# Patient Record
Sex: Female | Born: 1955 | Race: Black or African American | Hispanic: No | Marital: Married | Smoking: Never smoker
Health system: Southern US, Community
[De-identification: ages and names within clinical notes are randomized; demographics above are authoritative.]

## PROBLEM LIST (undated history)

## (undated) DIAGNOSIS — T7840XA Allergy, unspecified, initial encounter: Secondary | ICD-10-CM

## (undated) DIAGNOSIS — IMO0001 Reserved for inherently not codable concepts without codable children: Secondary | ICD-10-CM

## (undated) DIAGNOSIS — E119 Type 2 diabetes mellitus without complications: Secondary | ICD-10-CM

## (undated) DIAGNOSIS — I350 Nonrheumatic aortic (valve) stenosis: Secondary | ICD-10-CM

## (undated) DIAGNOSIS — I10 Essential (primary) hypertension: Secondary | ICD-10-CM

## (undated) DIAGNOSIS — J45909 Unspecified asthma, uncomplicated: Secondary | ICD-10-CM

## (undated) DIAGNOSIS — R0600 Dyspnea, unspecified: Secondary | ICD-10-CM

## (undated) DIAGNOSIS — Z6841 Body Mass Index (BMI) 40.0 and over, adult: Secondary | ICD-10-CM

## (undated) DIAGNOSIS — M1612 Unilateral primary osteoarthritis, left hip: Secondary | ICD-10-CM

## (undated) DIAGNOSIS — R011 Cardiac murmur, unspecified: Secondary | ICD-10-CM

## (undated) DIAGNOSIS — K219 Gastro-esophageal reflux disease without esophagitis: Secondary | ICD-10-CM

## (undated) DIAGNOSIS — G971 Other reaction to spinal and lumbar puncture: Secondary | ICD-10-CM

## (undated) DIAGNOSIS — F419 Anxiety disorder, unspecified: Secondary | ICD-10-CM

## (undated) DIAGNOSIS — M1990 Unspecified osteoarthritis, unspecified site: Secondary | ICD-10-CM

## (undated) DIAGNOSIS — D649 Anemia, unspecified: Secondary | ICD-10-CM

## (undated) DIAGNOSIS — E7800 Pure hypercholesterolemia, unspecified: Secondary | ICD-10-CM

## (undated) HISTORY — DX: Nonrheumatic aortic (valve) stenosis: I35.0

## (undated) HISTORY — PX: TUBAL LIGATION: SHX77

## (undated) HISTORY — DX: Gastro-esophageal reflux disease without esophagitis: K21.9

## (undated) HISTORY — DX: Unspecified asthma, uncomplicated: J45.909

## (undated) HISTORY — DX: Anemia, unspecified: D64.9

## (undated) HISTORY — DX: Anxiety disorder, unspecified: F41.9

## (undated) HISTORY — DX: Body Mass Index (BMI) 40.0 and over, adult: Z684

## (undated) HISTORY — DX: Pure hypercholesterolemia, unspecified: E78.00

## (undated) HISTORY — DX: Cardiac murmur, unspecified: R01.1

## (undated) HISTORY — DX: Type 2 diabetes mellitus without complications: E11.9

## (undated) HISTORY — PX: COLONOSCOPY: SHX174

## (undated) HISTORY — DX: Unilateral primary osteoarthritis, left hip: M16.12

## (undated) HISTORY — DX: Reserved for inherently not codable concepts without codable children: IMO0001

## (undated) HISTORY — DX: Allergy, unspecified, initial encounter: T78.40XA

## (undated) HISTORY — DX: Essential (primary) hypertension: I10

## (undated) HISTORY — DX: Unspecified osteoarthritis, unspecified site: M19.90

---

## 1999-11-04 ENCOUNTER — Other Ambulatory Visit: Admission: RE | Admit: 1999-11-04 | Discharge: 1999-11-04 | Payer: Self-pay | Admitting: Obstetrics

## 2001-07-11 ENCOUNTER — Encounter: Payer: Self-pay | Admitting: Oncology

## 2001-07-11 ENCOUNTER — Ambulatory Visit (HOSPITAL_COMMUNITY): Admission: RE | Admit: 2001-07-11 | Discharge: 2001-07-11 | Payer: Self-pay | Admitting: Oncology

## 2004-07-13 ENCOUNTER — Encounter: Admission: RE | Admit: 2004-07-13 | Discharge: 2004-10-11 | Payer: Self-pay | Admitting: Internal Medicine

## 2007-07-24 ENCOUNTER — Encounter (INDEPENDENT_AMBULATORY_CARE_PROVIDER_SITE_OTHER): Payer: Self-pay | Admitting: Gastroenterology

## 2007-07-24 ENCOUNTER — Ambulatory Visit (HOSPITAL_COMMUNITY): Admission: RE | Admit: 2007-07-24 | Discharge: 2007-07-24 | Payer: Self-pay | Admitting: Gastroenterology

## 2009-07-25 ENCOUNTER — Ambulatory Visit (HOSPITAL_COMMUNITY): Admission: RE | Admit: 2009-07-25 | Discharge: 2009-07-25 | Payer: Self-pay | Admitting: Specialist

## 2010-06-14 ENCOUNTER — Encounter: Payer: Self-pay | Admitting: Family Medicine

## 2010-06-27 ENCOUNTER — Inpatient Hospital Stay (INDEPENDENT_AMBULATORY_CARE_PROVIDER_SITE_OTHER)
Admission: RE | Admit: 2010-06-27 | Discharge: 2010-06-27 | Disposition: A | Discharge status: Home / Self Care | Payer: Commercial Managed Care - PPO | Source: Ambulatory Visit | Attending: Family Medicine | Admitting: Family Medicine

## 2010-06-27 DIAGNOSIS — S40019A Contusion of unspecified shoulder, initial encounter: Secondary | ICD-10-CM

## 2010-10-06 NOTE — Op Note (Signed)
NAME:  [name], [name]             ACCOUNT NO.:  [id number]   MEDICAL RECORD NO.:  [id number]          PATIENT TYPE:  AMB   LOCATION:  ENDO                         FACILITY:  [facility]   PHYSICIAN:  Anselmo Rod, M.D.  DATE OF BIRTH:  1955/12/09   DATE OF PROCEDURE:  07/24/2007  DATE OF DISCHARGE:                               OPERATIVE REPORT   PROCEDURE PERFORMED:  Esophagogastroduodenoscopy with small-bowel  biopsies.   ENDOSCOPIST:  Anselmo Rod, MD   INSTRUMENT USED:  Pentax video panendoscope.   INDICATIONS FOR PROCEDURE:  [age]-year-old African American female with  a history of iron-deficiency anemia, undergoing EGD.  Small-bowel  biopsies are planned.   PREPROCEDURE PREPARATION:  The patient was fasted for 8 hours prior to  the procedure.  Risks and benefits of the procedure were discussed with  the patient in great detail.   PREPROCEDURE PHYSICAL:  VITAL SIGNS:  The patient had stable vital  signs.  NECK:  Supple.  CHEST:  Clear to auscultation.  CARDIAC:  S1 and S2 regular.  ABDOMEN:  Soft with normal bowel sounds.   DESCRIPTION OF PROCEDURE:  The patient was placed in the left lateral  decubitus position and sedated with 125 mcg of Fentanyl and 12.5 mg of  Versed given intravenously in slow incremental doses.  Once the patient  was adequately sedated and maintained on low-flow oxygen and continuous  cardiac monitoring, the Pentax video panendoscope was advanced through  the mouthpiece, over the tongue and into the esophagus under direct  vision.  The entire esophagus was widely patent with no evidence of  ring, stricture, mass, esophagitis or Barrett's mucosa.  The scope was  then advanced into the stomach.  A small hiatal hernia was seen on high  retroflexion.  The rest of the gastric mucosa and proximal small bowel  appeared normal.  Small-bowel biopsies were done to rule out sprue.  The  patient tolerated the procedure well without complications.  There  was  no outlet obstruction.   IMPRESSION:  1. Normal esophagogastroduodenoscopy, except for a small hiatal hernia      seen on high retroflexion.  2. Small bowel biopsies done to rule out sprue.   RECOMMENDATIONS:  1. Await pathology results.  2. Avoid all nonsteroidals including aspirin for the next 2 weeks.  3. Proceed with a colonoscopy at this time.  4. Further recommendations will be made thereafter.      Anselmo Rod, M.D.  Electronically Signed     JNM/MEDQ  D:  07/25/2007  T:  07/26/2007  Job:  [id number]   cc:   Carollee Massed

## 2010-10-06 NOTE — Op Note (Signed)
NAME:  [name], [name]             ACCOUNT NO.:  [id number]   MEDICAL RECORD NO.:  [id number]          PATIENT TYPE:  AMB   LOCATION:  ENDO                         FACILITY:  [facility]   PHYSICIAN:  Anselmo Rod, M.D.  DATE OF BIRTH:  [birth year]   DATE OF PROCEDURE:  07/24/2007  DATE OF DISCHARGE:                               OPERATIVE REPORT   PROCEDURE PERFORMED:  Screening colonoscopy.   ENDOSCOPIST:  Anselmo Rod, M.D.   INSTRUMENT USED:  Pentax video colonoscope.   INDICATIONS FOR PROCEDURE:  A 55 year old [place] American female with a  history of iron-deficiency anemia undergoing a colonoscopy to rule out  colonic polyps, masses, etc.   PREPROCEDURE PREPARATION:  Informed consent was procured from the  patient.  The patient fasted for 8 hours prior to the procedure after  being prepped with a bottle of magnesium citrate and a gallon of  NuLYTELY the night prior to the procedure.  Risks and benefits of the  procedure including a 10% miss rate of cancer and polyps, were discussed  with the patient as well.   PREPROCEDURE PREPARATION:  The patient had stable vital signs.  NECK:  Supple.  CHEST:  Clear to auscultation.  S1, S2 regular.  ABDOMEN:  Soft with normal bowel sounds.   DESCRIPTION OF PROCEDURE:  The patient was placed in the left lateral  decubitus position and sedated with Fentanyl and Versed for the EGD.  No  additional sedation was used for the colonoscopy.  Once the patient was  adequately positioned, the Pentax video colonoscope was advanced from  the rectum to the cecum.  The appendiceal orifice and ileocecal valve  were clearly visualized and photographed.  The terminal ileum appeared  healthy and without lesions.  There was some residual stool in the  colon.  Multiple washes were done.  Retroflexion in the rectum revealed  no abnormalities.   IMPRESSION:  Normal colonoscopy up to the terminal ileum.  No masses,  polyps, erosions, ulcerations or  diverticula noted.   RECOMMENDATIONS.:  1. Continue a high-fiber diet with liberal fluid intake.  2. Repeat colonoscopy in the next 10 years.  If the patient has any      abnormal symptoms in the interim, she is to contact the office      immediately for further recommendations.  3. Outpatient follow-up as need arises in the future.      Anselmo Rod, M.D.  Electronically Signed     JNM/MEDQ  D:  07/25/2007  T:  07/26/2007  Job:  [id number]   cc:   Otho Bellows, MD

## 2011-07-04 ENCOUNTER — Ambulatory Visit (INDEPENDENT_AMBULATORY_CARE_PROVIDER_SITE_OTHER): Payer: 59 | Admitting: Emergency Medicine

## 2011-07-04 VITALS — BP 130/78 | HR 68 | Temp 98.2°F | Resp 14 | Ht 58.5 in | Wt 221.0 lb

## 2011-07-04 DIAGNOSIS — D649 Anemia, unspecified: Secondary | ICD-10-CM

## 2011-07-04 DIAGNOSIS — I10 Essential (primary) hypertension: Secondary | ICD-10-CM | POA: Insufficient documentation

## 2011-07-04 DIAGNOSIS — M545 Low back pain, unspecified: Secondary | ICD-10-CM

## 2011-07-04 DIAGNOSIS — E119 Type 2 diabetes mellitus without complications: Secondary | ICD-10-CM | POA: Insufficient documentation

## 2011-07-04 DIAGNOSIS — Z0000 Encounter for general adult medical examination without abnormal findings: Secondary | ICD-10-CM

## 2011-07-04 DIAGNOSIS — K219 Gastro-esophageal reflux disease without esophagitis: Secondary | ICD-10-CM

## 2011-07-04 DIAGNOSIS — E782 Mixed hyperlipidemia: Secondary | ICD-10-CM

## 2011-07-04 DIAGNOSIS — E7800 Pure hypercholesterolemia, unspecified: Secondary | ICD-10-CM | POA: Insufficient documentation

## 2011-07-04 DIAGNOSIS — R8281 Pyuria: Secondary | ICD-10-CM

## 2011-07-04 DIAGNOSIS — Z139 Encounter for screening, unspecified: Secondary | ICD-10-CM

## 2011-07-04 DIAGNOSIS — IMO0001 Reserved for inherently not codable concepts without codable children: Secondary | ICD-10-CM

## 2011-07-04 LAB — COMPREHENSIVE METABOLIC PANEL
ALT: 13 U/L (ref 0–35)
AST: 16 U/L (ref 0–37)
Albumin: 4.7 g/dL (ref 3.5–5.2)
Alkaline Phosphatase: 58 U/L (ref 39–117)
BUN: 14 mg/dL (ref 6–23)
CO2: 31 mEq/L (ref 19–32)
Calcium: 10 mg/dL (ref 8.4–10.5)
Chloride: 101 mEq/L (ref 96–112)
Creat: 0.82 mg/dL (ref 0.50–1.10)
Glucose, Bld: 97 mg/dL (ref 70–99)
Potassium: 3.9 mEq/L (ref 3.5–5.3)
Sodium: 142 mEq/L (ref 135–145)
Total Bilirubin: 0.3 mg/dL (ref 0.3–1.2)
Total Protein: 7.7 g/dL (ref 6.0–8.3)

## 2011-07-04 LAB — POCT CBC
Granulocyte percent: 53.8 %G (ref 37–80)
HCT, POC: 37.8 % (ref 37.7–47.9)
Hemoglobin: 11.5 g/dL — AB (ref 12.2–16.2)
Lymph, poc: 2.8 (ref 0.6–3.4)
MCH, POC: 23.4 pg — AB (ref 27–31.2)
MCHC: 30.4 g/dL — AB (ref 31.8–35.4)
MCV: 76.9 fL — AB (ref 80–97)
MID (cbc): 0.5 (ref 0–0.9)
MPV: 8.8 fL (ref 0–99.8)
POC Granulocyte: 3.8 (ref 2–6.9)
POC LYMPH PERCENT: 39.7 %L (ref 10–50)
POC MID %: 6.5 %M (ref 0–12)
Platelet Count, POC: 340 10*3/uL (ref 142–424)
RBC: 4.9 M/uL (ref 4.04–5.48)
RDW, POC: 19.3 %
WBC: 7.1 10*3/uL (ref 4.6–10.2)

## 2011-07-04 LAB — POCT URINALYSIS DIPSTICK
Bilirubin, UA: NEGATIVE
Glucose, UA: NEGATIVE
Ketones, UA: NEGATIVE
Nitrite, UA: POSITIVE
Protein, UA: 30
Spec Grav, UA: 1.02
Urobilinogen, UA: 0.2
pH, UA: 5.5

## 2011-07-04 LAB — LIPID PANEL
Cholesterol: 153 mg/dL (ref 0–200)
HDL: 44 mg/dL (ref >39)
LDL Cholesterol: 87 mg/dL (ref 0–99)
Total CHOL/HDL Ratio: 3.5 Ratio
Triglycerides: 108 mg/dL (ref <150)
VLDL: 22 mg/dL (ref 0–40)

## 2011-07-04 LAB — GLUCOSE, POCT (MANUAL RESULT ENTRY): POC Glucose: 97

## 2011-07-04 LAB — POCT GLYCOSYLATED HEMOGLOBIN (HGB A1C): Hemoglobin A1C: 7.2

## 2011-07-04 LAB — TSH: TSH: 1.511 u[IU]/mL (ref 0.350–4.500)

## 2011-07-04 MED ORDER — CYCLOBENZAPRINE HCL 5 MG PO TABS: 5.0000 mg | ORAL_TABLET | Freq: Three times a day (TID) | ORAL | Status: AC | PRN

## 2011-07-04 MED ORDER — METOPROLOL SUCCINATE ER 100 MG PO TB24: 100.0000 mg | ORAL_TABLET | Freq: Every day | ORAL | Status: DC

## 2011-07-04 MED ORDER — LANSOPRAZOLE 30 MG PO CPDR: 30.0000 mg | DELAYED_RELEASE_CAPSULE | Freq: Every day | ORAL | Status: DC

## 2011-07-04 MED ORDER — PRAVASTATIN SODIUM 80 MG PO TABS: 80.0000 mg | ORAL_TABLET | Freq: Every day | ORAL | Status: DC

## 2011-07-04 MED ORDER — METFORMIN HCL 500 MG PO TABS: 1000.0000 mg | ORAL_TABLET | Freq: Two times a day (BID) | ORAL | Status: DC

## 2011-07-04 MED ORDER — LOSARTAN POTASSIUM-HCTZ 100-25 MG PO TABS: 1.0000 | ORAL_TABLET | Freq: Every day | ORAL | Status: DC

## 2011-07-04 MED ORDER — AMLODIPINE BESYLATE 5 MG PO TABS: 5.0000 mg | ORAL_TABLET | Freq: Every day | ORAL | Status: DC

## 2011-07-04 MED ORDER — FERROUS FUMARATE 325 (106 FE) MG PO TABS: 1.0000 | ORAL_TABLET | Freq: Every day | ORAL | Status: DC

## 2011-07-04 NOTE — Assessment & Plan Note (Signed)
HTN seems well controlled on current meds.  Tolerating them well.  Will continue current doses and recheck in 3 months.

## 2011-07-04 NOTE — Patient Instructions (Addendum)

## 2011-07-04 NOTE — Assessment & Plan Note (Signed)
Well-controlled on Prevacid. 

## 2011-07-04 NOTE — Assessment & Plan Note (Signed)
Will check labs (fasting) and then adjust meds if needed.

## 2011-07-04 NOTE — Assessment & Plan Note (Signed)
Relatively well controlled.  Continue current meds.  Pt to try really hard with weight loss and increased exercise.

## 2011-07-04 NOTE — Progress Notes (Signed)
  Subjective:    Patient ID: [name], female    DOB: 08-31-1955, 56 y.o.   MRN: [id number]  HPI  Here for CPE.  All chronic problems are doing ok.  No checking sugars or BP at home.    Only new problem is lumbar back pain.  Stand all day at work and developed pain on R lumbar back with some pain radiation down back of leg, some slight tingling but no numbness.  Used no meds. Has been on muscle relaxers in the past which have helped.  Review of Systems  Constitutional: Negative.   HENT: Negative.   Eyes: Negative.        ? Last eye exam   Respiratory: Negative.   Cardiovascular: Negative.   Gastrointestinal: Negative.   Genitourinary: Negative.   Musculoskeletal: Positive for back pain. Negative for gait problem.  Skin: Negative.   Neurological: Negative.        Objective:   Physical Exam  Constitutional: She is oriented to person, place, and time. She appears well-developed and well-nourished.  HENT:  Head: Normocephalic and atraumatic.  Right Ear: Hearing, tympanic membrane, external ear and ear canal normal.  Left Ear: Hearing, tympanic membrane, external ear and ear canal normal.  Nose: Nose normal.  Mouth/Throat: Oropharynx is clear and moist.  Eyes: Conjunctivae and EOM are normal. Pupils are equal, round, and reactive to light.  Neck: Normal range of motion. Neck supple. No tracheal deviation present. No thyromegaly present.  Cardiovascular: Normal rate, regular rhythm, normal heart sounds and intact distal pulses.   No murmur heard. Pulmonary/Chest: Effort normal and breath sounds normal.  Abdominal: Soft. Bowel sounds are normal. There is no tenderness. There is no rebound and no guarding.  Musculoskeletal: Normal range of motion.       Lumbar back: She exhibits normal range of motion, no tenderness, no bony tenderness, no swelling and no pain.  Lymphadenopathy:    She has no cervical adenopathy.  Neurological: She is alert and oriented to person, place,  and time. She has normal reflexes.  Skin: Skin is warm and dry.  Psychiatric: She has a normal mood and affect. Her behavior is normal. Judgment and thought content normal.    EKG interpretation: NSR without an acute changes.      Assessment & Plan:   1. Annual physical exam  EKG 12-Lead, POCT CBC, POCT glycosylated hemoglobin (Hb A1C), POCT glucose (manual entry), POCT urinalysis dipstick, Comprehensive metabolic panel, Lipid panel, TSH  2. DM type 2 (diabetes mellitus, type 2)  EKG 12-Lead, POCT glycosylated hemoglobin (Hb A1C), POCT glucose (manual entry)  3. HTN (hypertension)  EKG 12-Lead, Comprehensive metabolic panel, Lipid panel, amLODipine (NORVASC) 5 MG tablet, losartan-hydrochlorothiazide (HYZAAR) 100-25 MG per tablet, metoprolol succinate (TOPROL-XL) 100 MG 24 hr tablet  4. Hypercholesteremia  EKG 12-Lead, Lipid panel, pravastatin (PRAVACHOL) 80 MG tablet  5. Screening  TSH  6. GERD (gastroesophageal reflux disease)  lansoprazole (PREVACID) 30 MG capsule  7. Anemia  ferrous fumarate (HEMOCYTE - 106 MG FE) 325 (106 FE) MG TABS  8. Hematuria  POCT UA - Microscopic Only  9. Hypercholesterolemia    10. Reflux    11. Pyuria  Urine culture  12. Lumbar pain  cyclobenzaprine (FLEXERIL) 5 MG tablet

## 2011-07-06 ENCOUNTER — Telehealth: Payer: Self-pay

## 2011-07-06 NOTE — Telephone Encounter (Signed)
Pt is requesting physical paper be filled out. Patient had phys at our office 07/04/11. cdc

## 2011-07-07 NOTE — Telephone Encounter (Signed)
I have referred this note to say her Weber who completed her physical exam.

## 2011-07-07 NOTE — Telephone Encounter (Signed)
Form filled out for foster parent (see media section fr copy).  She should get a copy of her TB from the hospital if she needs it.

## 2011-07-07 NOTE — Telephone Encounter (Signed)
Patient notified

## 2011-07-07 NOTE — Telephone Encounter (Signed)
Dr Cleta Alberts, PE form is in your box

## 2011-07-07 NOTE — Telephone Encounter (Signed)
[name] saw this pt

## 2011-07-09 ENCOUNTER — Telehealth: Payer: Self-pay | Admitting: Physician Assistant

## 2011-07-09 NOTE — Telephone Encounter (Signed)
Left message on patient's home phone to return our call. [name], Marion Downer

## 2011-07-09 NOTE — Telephone Encounter (Signed)
Please call pt - her urine was never sent for culture by accident - please have her RTC to drop off a sample (no charge because it is already ordered) if she is still having problems.

## 2011-07-20 NOTE — Telephone Encounter (Signed)
Have not heard back from patient 07/20/2011.

## 2011-09-09 ENCOUNTER — Ambulatory Visit (INDEPENDENT_AMBULATORY_CARE_PROVIDER_SITE_OTHER): Payer: 59 | Admitting: Family Medicine

## 2011-09-09 VITALS — BP 147/83 | HR 93 | Temp 98.5°F | Resp 16 | Ht <= 58 in | Wt 220.0 lb

## 2011-09-09 DIAGNOSIS — N898 Other specified noninflammatory disorders of vagina: Secondary | ICD-10-CM

## 2011-09-09 LAB — POCT WET PREP WITH KOH
KOH Prep POC: NEGATIVE
Trichomonas, UA: NEGATIVE
Yeast Wet Prep HPF POC: NEGATIVE

## 2011-09-09 MED ORDER — FLUCONAZOLE 150 MG PO TABS: 150.0000 mg | ORAL_TABLET | Freq: Once | ORAL | Status: AC

## 2011-09-09 MED ORDER — METRONIDAZOLE 500 MG PO TABS: 500.0000 mg | ORAL_TABLET | Freq: Two times a day (BID) | ORAL | Status: AC

## 2011-09-09 NOTE — Progress Notes (Signed)
  Patient Name: [name] Date of Birth: 1955/07/20 Medical Record Number: [id number] Gender: female Date of Encounter: 09/09/2011  History of Present Illness:  [name] is a 56 y.o. very pleasant female patient who presents with the following:  Complaint of vaginal itching for about 2 weeks.  No discharge, no new sexual partner.  She is menopausal and has not had any bleeding.  No urinary or GI symptoms.  She does not suspect any possibility of an STI  Patient Active Problem List  Diagnoses  . DM type 2 (diabetes mellitus, type 2)  . HTN (hypertension)  . Hypercholesterolemia  . Reflux   Past Medical History  Diagnosis Date  . DM type 2 (diabetes mellitus, type 2)   . HTN (hypertension)   . Hypercholesterolemia   . Reflux    Past Surgical History  Procedure Date  . Cesarean section     x3   History  Substance Use Topics  . Smoking status: Never Smoker   . Smokeless tobacco: Not on file  . Alcohol Use: No   Family History  Problem Relation Age of Onset  . Hypertension Mother   . Heart disease Father   . Stroke Brother   . Multiple sclerosis Brother   . Multiple sclerosis Sister    Allergies  Allergen Reactions  . Sulfa Antibiotics Hives    Medication list has been reviewed and updated.  Review of Systems: As per HPI- otherwise negative. She notes frequent episodes of yeast vaginitis.  Has not tried any otc treatments as yet this time  Physical Examination: Filed Vitals:   09/09/11 1524  BP: 147/83  Pulse: 93  Temp: 98.5 F (36.9 C)  Resp: 16  Height: 4\' 10"  (1.473 m)  Weight: 220 lb (99.791 kg)    Body mass index is 45.98 kg/(m^2).  GEN: WDWN, NAD, Non-toxic, A & O x 3, morbid obesity HEENT: Atraumatic, Normocephalic. Neck supple. No masses, No LAD. Ears and Nose: No external deformity. CV: RRR, No M/G/R. No JVD. No thrill. No extra heart sounds. PULM: CTA B, no wheezes, crackles, rhonchi. No retractions. No resp. distress. No  accessory muscle use. ABD: S, NT, ND, +BS. No rebound. No HSM. GU: normal external genitals, vaginal vault normal, no CMT or adnexal tenderness or masses EXTR: No c/c/e NEURO Normal gait.  PSYCH: Normally interactive. Conversant. Not depressed or anxious appearing.  Calm demeanor.   Results for orders placed in visit on 09/09/11  POCT WET PREP WITH KOH      Component Value Range   Trichomonas, UA Negative     Clue Cells Wet Prep HPF POC 0-1     Epithelial Wet Prep HPF POC 0-5     Yeast Wet Prep HPF POC neg     Bacteria Wet Prep HPF POC 3+     RBC Wet Prep HPF POC 0-1     WBC Wet Prep HPF POC 0-1     KOH Prep POC Negative      Assessment and Plan: 1. Vaginal Discharge  POCT Wet Prep with KOH, metroNIDAZOLE (FLAGYL) 500 MG tablet, fluconazole (DIFLUCAN) 150 MG tablet   Symptoms suspicious for BV- will treat as above and also cover yeast as she tends to get these infections and we are using an antibiotic.  Patient (or parent if minor) instructed to return to clinic or call if not better in 3-4 day(s).

## 2011-12-27 ENCOUNTER — Ambulatory Visit (INDEPENDENT_AMBULATORY_CARE_PROVIDER_SITE_OTHER): Payer: 59 | Admitting: Family Medicine

## 2011-12-27 VITALS — BP 154/98 | HR 103 | Temp 98.1°F | Resp 20 | Ht <= 58 in | Wt 225.0 lb

## 2011-12-27 DIAGNOSIS — E669 Obesity, unspecified: Secondary | ICD-10-CM

## 2011-12-27 DIAGNOSIS — I10 Essential (primary) hypertension: Secondary | ICD-10-CM

## 2011-12-27 DIAGNOSIS — E119 Type 2 diabetes mellitus without complications: Secondary | ICD-10-CM

## 2011-12-27 DIAGNOSIS — K219 Gastro-esophageal reflux disease without esophagitis: Secondary | ICD-10-CM

## 2011-12-27 DIAGNOSIS — R5383 Other fatigue: Secondary | ICD-10-CM

## 2011-12-27 DIAGNOSIS — F32A Depression, unspecified: Secondary | ICD-10-CM

## 2011-12-27 DIAGNOSIS — R0600 Dyspnea, unspecified: Secondary | ICD-10-CM

## 2011-12-27 DIAGNOSIS — E7800 Pure hypercholesterolemia, unspecified: Secondary | ICD-10-CM

## 2011-12-27 DIAGNOSIS — R5381 Other malaise: Secondary | ICD-10-CM

## 2011-12-27 DIAGNOSIS — F329 Major depressive disorder, single episode, unspecified: Secondary | ICD-10-CM

## 2011-12-27 DIAGNOSIS — R0609 Other forms of dyspnea: Secondary | ICD-10-CM

## 2011-12-27 DIAGNOSIS — R0989 Other specified symptoms and signs involving the circulatory and respiratory systems: Secondary | ICD-10-CM

## 2011-12-27 DIAGNOSIS — IMO0001 Reserved for inherently not codable concepts without codable children: Secondary | ICD-10-CM

## 2011-12-27 DIAGNOSIS — D649 Anemia, unspecified: Secondary | ICD-10-CM

## 2011-12-27 LAB — POCT CBC
Granulocyte percent: 58.4 %G (ref 37–80)
HCT, POC: 39.5 % (ref 37.7–47.9)
Hemoglobin: 11.6 g/dL — AB (ref 12.2–16.2)
Lymph, poc: 3.4 (ref 0.6–3.4)
MCH, POC: 22.7 pg — AB (ref 27–31.2)
MCHC: 29.4 g/dL — AB (ref 31.8–35.4)
MCV: 77.2 fL — AB (ref 80–97)
MID (cbc): 0.7 (ref 0–0.9)
MPV: 10 fL (ref 0–99.8)
POC Granulocyte: 5.8 (ref 2–6.9)
POC LYMPH PERCENT: 34.8 %L (ref 10–50)
POC MID %: 6.8 %M (ref 0–12)
Platelet Count, POC: 375 10*3/uL (ref 142–424)
RBC: 5.12 M/uL (ref 4.04–5.48)
RDW, POC: 18.8 %
WBC: 9.9 10*3/uL (ref 4.6–10.2)

## 2011-12-27 LAB — LIPID PANEL
Cholesterol: 151 mg/dL (ref 0–200)
HDL: 37 mg/dL — ABNORMAL LOW (ref >39)
LDL Cholesterol: 83 mg/dL (ref 0–99)
Total CHOL/HDL Ratio: 4.1 Ratio
Triglycerides: 154 mg/dL — ABNORMAL HIGH (ref <150)
VLDL: 31 mg/dL (ref 0–40)

## 2011-12-27 LAB — COMPREHENSIVE METABOLIC PANEL
ALT: 14 U/L (ref 0–35)
AST: 14 U/L (ref 0–37)
Albumin: 4.7 g/dL (ref 3.5–5.2)
Alkaline Phosphatase: 54 U/L (ref 39–117)
BUN: 11 mg/dL (ref 6–23)
CO2: 28 mEq/L (ref 19–32)
Calcium: 10.2 mg/dL (ref 8.4–10.5)
Chloride: 103 mEq/L (ref 96–112)
Creat: 0.77 mg/dL (ref 0.50–1.10)
Glucose, Bld: 118 mg/dL — ABNORMAL HIGH (ref 70–99)
Potassium: 3.8 mEq/L (ref 3.5–5.3)
Sodium: 141 mEq/L (ref 135–145)
Total Bilirubin: 0.3 mg/dL (ref 0.3–1.2)
Total Protein: 7.5 g/dL (ref 6.0–8.3)

## 2011-12-27 LAB — POCT GLYCOSYLATED HEMOGLOBIN (HGB A1C): Hemoglobin A1C: 6.9

## 2011-12-27 LAB — GLUCOSE, POCT (MANUAL RESULT ENTRY): POC Glucose: 123 mg/dl — AB (ref 70–99)

## 2011-12-27 LAB — TSH: TSH: 1.846 u[IU]/mL (ref 0.350–4.500)

## 2011-12-27 MED ORDER — LANSOPRAZOLE 30 MG PO CPDR: 30.0000 mg | DELAYED_RELEASE_CAPSULE | Freq: Every day | ORAL | Status: DC

## 2011-12-27 MED ORDER — LOSARTAN POTASSIUM-HCTZ 100-25 MG PO TABS: 1.0000 | ORAL_TABLET | Freq: Every day | ORAL | Status: DC

## 2011-12-27 MED ORDER — METOPROLOL SUCCINATE ER 100 MG PO TB24: 100.0000 mg | ORAL_TABLET | Freq: Every day | ORAL | Status: DC

## 2011-12-27 MED ORDER — AMLODIPINE BESYLATE 5 MG PO TABS: 5.0000 mg | ORAL_TABLET | Freq: Every day | ORAL | Status: DC

## 2011-12-27 MED ORDER — FERROUS FUMARATE 325 (106 FE) MG PO TABS: 1.0000 | ORAL_TABLET | Freq: Every day | ORAL | Status: DC

## 2011-12-27 MED ORDER — PRAVASTATIN SODIUM 80 MG PO TABS: 80.0000 mg | ORAL_TABLET | Freq: Every day | ORAL | Status: DC

## 2011-12-27 MED ORDER — METFORMIN HCL 500 MG PO TABS: 1000.0000 mg | ORAL_TABLET | Freq: Two times a day (BID) | ORAL | Status: DC

## 2011-12-27 NOTE — Progress Notes (Signed)
Subjective: 56 year old lady with history of diabetes and high blood pressure. She is here for medicines to be refilled. She takes her medicines faithfully. She goes to sports of exercising, and currently has not been exercising. She takes a Tylenol PM to help her sleep. She is depressed a good deal. Denies suicidal faults, but does get down at times and cries some. 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  Occasionally she gets a chest pain. She does have shortness of breath on exertion. She's trying to lose weight but can't. She does not smoke. She is married, good marriage, likes to do various things like flea markets with her spouse.  Objective: Obese Afro-American female in no acute distress. HEENT TMs are normal throat clear neck supple without nodes or thyromegaly no carotid bruits chest is clear heart regular without murmurs abdomen soft without mass or tenderness or edema  Assessment: Diabetes Hypertension Obesity History of anemia Depression Shortness of breath Occasional chest pain  Plan: Check lab and discuss further before prescribe her medications.  Results for orders placed in visit on 12/27/11  POCT GLYCOSYLATED HEMOGLOBIN (HGB A1C)      Component Value Range   Hemoglobin A1C 6.9    POCT CBC      Component Value Range   WBC 9.9  4.6 - 10.2 K/uL   Lymph, poc 3.4  0.6 - 3.4   POC LYMPH PERCENT 34.8  10 - 50 %L   MID (cbc) 0.7  0 - 0.9   POC MID % 6.8  0 - 12 %M   POC Granulocyte 5.8  2 - 6.9   Granulocyte percent 58.4  37 - 80 %G   RBC 5.12  4.04 - 5.48 M/uL   Hemoglobin 11.6 (*) 12.2 - 16.2 g/dL   HCT, POC 16.1  09.6 - 47.9 %   MCV 77.2 (*) 80 - 97 fL   MCH, POC 22.7 (*) 27 - 31.2 pg   MCHC 29.4 (*) 31.8 - 35.4 g/dL   RDW, POC 04.5     Platelet Count, POC 375  142 - 424 K/uL   MPV 10  0 - 99.8 fL  GLUCOSE, POCT (MANUAL RESULT ENTRY)      Component Value Range   POC Glucose 123 (*) 70 - 99 mg/dl

## 2011-12-27 NOTE — Patient Instructions (Signed)
Continue same medications. If depression is getting worse, come in for reevaluation.  As we discussed I urged you to cut back on your oral intake and get regular exercise by walking.  Return in 3-4 months

## 2012-01-02 ENCOUNTER — Encounter: Payer: Self-pay | Admitting: Family Medicine

## 2012-03-02 ENCOUNTER — Telehealth: Payer: Self-pay

## 2012-03-02 NOTE — Telephone Encounter (Signed)
Patient advised there are MANY different types of iron, I have asked her to have her pharmacy send over request for the iron she was previously taking so we can get her the correct one

## 2012-03-02 NOTE — Telephone Encounter (Signed)
PT STATES SHE SEES DR Neva Seat AND HAVE ALWAYS TAKEN AN IRON PILL, HOWEVER DR Neva Seat CHANGED THE DOSAGE AND IT CAME IN A CAPSULE INSTEAD OF A TABLET WHICH SHE CAN'T TAKE BECAUSE IT CONSTIPATE HER. WOULD LIKE TO GO BACK TO WHAT SHE WAS TAKEN BEFORE. PLEASE CALL [phone]

## 2012-04-02 ENCOUNTER — Telehealth: Payer: Self-pay | Admitting: Physician Assistant

## 2012-04-02 MED ORDER — POLYSACCHARIDE IRON COMPLEX 150 MG PO CAPS: 150.0000 mg | ORAL_CAPSULE | Freq: Two times a day (BID) | ORAL | Status: DC

## 2012-04-02 NOTE — Telephone Encounter (Signed)
Pt requested change to generic Niferex from ferrous fumarate, prescription sent to [facility]

## 2012-05-27 ENCOUNTER — Ambulatory Visit: Payer: 59

## 2012-05-27 ENCOUNTER — Ambulatory Visit (INDEPENDENT_AMBULATORY_CARE_PROVIDER_SITE_OTHER): Payer: 59 | Admitting: Family Medicine

## 2012-05-27 VITALS — BP 170/94 | HR 103 | Temp 98.8°F | Resp 16 | Ht 59.0 in | Wt 226.0 lb

## 2012-05-27 DIAGNOSIS — M25559 Pain in unspecified hip: Secondary | ICD-10-CM

## 2012-05-27 DIAGNOSIS — E119 Type 2 diabetes mellitus without complications: Secondary | ICD-10-CM

## 2012-05-27 DIAGNOSIS — E7800 Pure hypercholesterolemia, unspecified: Secondary | ICD-10-CM

## 2012-05-27 DIAGNOSIS — D649 Anemia, unspecified: Secondary | ICD-10-CM

## 2012-05-27 DIAGNOSIS — K219 Gastro-esophageal reflux disease without esophagitis: Secondary | ICD-10-CM

## 2012-05-27 DIAGNOSIS — I10 Essential (primary) hypertension: Secondary | ICD-10-CM

## 2012-05-27 DIAGNOSIS — M25552 Pain in left hip: Secondary | ICD-10-CM

## 2012-05-27 DIAGNOSIS — J45909 Unspecified asthma, uncomplicated: Secondary | ICD-10-CM

## 2012-05-27 LAB — POCT CBC
Granulocyte percent: 55.4 %G (ref 37–80)
HCT, POC: 39.4 % (ref 37.7–47.9)
Hemoglobin: 11.7 g/dL — AB (ref 12.2–16.2)
Lymph, poc: 3.6 — AB (ref 0.6–3.4)
MCH, POC: 23.1 pg — AB (ref 27–31.2)
MCHC: 29.7 g/dL — AB (ref 31.8–35.4)
MCV: 77.8 fL — AB (ref 80–97)
MID (cbc): 0.7 (ref 0–0.9)
MPV: 10.7 fL (ref 0–99.8)
POC Granulocyte: 5.3 (ref 2–6.9)
POC LYMPH PERCENT: 37.5 %L (ref 10–50)
POC MID %: 7.1 %M (ref 0–12)
Platelet Count, POC: 376 10*3/uL (ref 142–424)
RBC: 5.07 M/uL (ref 4.04–5.48)
RDW, POC: 18.7 %
WBC: 9.6 10*3/uL (ref 4.6–10.2)

## 2012-05-27 LAB — POCT GLYCOSYLATED HEMOGLOBIN (HGB A1C): Hemoglobin A1C: 7.3

## 2012-05-27 MED ORDER — ALBUTEROL SULFATE HFA 108 (90 BASE) MCG/ACT IN AERS: 2.0000 | INHALATION_SPRAY | Freq: Four times a day (QID) | RESPIRATORY_TRACT | Status: DC | PRN

## 2012-05-27 MED ORDER — LOSARTAN POTASSIUM-HCTZ 100-25 MG PO TABS: 1.0000 | ORAL_TABLET | Freq: Every day | ORAL | Status: DC

## 2012-05-27 MED ORDER — CELECOXIB 200 MG PO CAPS: 200.0000 mg | ORAL_CAPSULE | Freq: Every day | ORAL | Status: DC

## 2012-05-27 MED ORDER — METOPROLOL SUCCINATE ER 100 MG PO TB24: 100.0000 mg | ORAL_TABLET | Freq: Every day | ORAL | Status: DC

## 2012-05-27 MED ORDER — POLYSACCHARIDE IRON COMPLEX 150 MG PO CAPS: 150.0000 mg | ORAL_CAPSULE | Freq: Two times a day (BID) | ORAL | Status: DC

## 2012-05-27 MED ORDER — METFORMIN HCL 500 MG PO TABS: 1000.0000 mg | ORAL_TABLET | Freq: Two times a day (BID) | ORAL | Status: DC

## 2012-05-27 MED ORDER — PRAVASTATIN SODIUM 80 MG PO TABS: 80.0000 mg | ORAL_TABLET | Freq: Every day | ORAL | Status: DC

## 2012-05-27 MED ORDER — AMLODIPINE BESYLATE 5 MG PO TABS: 5.0000 mg | ORAL_TABLET | Freq: Every day | ORAL | Status: DC

## 2012-05-27 MED ORDER — LANSOPRAZOLE 30 MG PO CPDR: 30.0000 mg | DELAYED_RELEASE_CAPSULE | Freq: Every day | ORAL | Status: DC

## 2012-05-27 NOTE — Progress Notes (Signed)
[facility]:  Office Visit  Chief Complaint:  Chief Complaint  Patient presents with  . Medication Refill  . Hip Pain    Left hip x 2 weeks; NKI    HPI: [name] is a 57 y.o. female who complains of : 1. Needs med refills: HTN, XOL,DM.  HTN-compliant, no SEs, does not take BP at home XOL-compliant, no SEs DM-compliant, no SEs, denies neuropathy, not UTD on vaccines or eye exam, does not check sugars at home 2. Left Hip pain, back pain -taking Aleve without relief x 2 weeks, whenever she takes a step there is a sharp pain. Feels dislocated. Increase pain with walking , intermittent, sharp pain rated as 7/10. NKI. Denies arthritis. Has a h/o chronic back pain x several years. Took mobic and did not feel like it was any better for her back. 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 She is interested in getting referred to the chronic pain clinic.    Past Medical History  Diagnosis Date  . DM type 2 (diabetes mellitus, type 2)   . HTN (hypertension)   . Hypercholesterolemia   . Reflux   . Asthma    Past Surgical History  Procedure Date  . Cesarean section     x3   History   Social History  . Marital Status: Married    Spouse Name: N/A    Number of Children: N/A  . Years of Education: N/A   Social History Main Topics  . Smoking status: Never Smoker   . Smokeless tobacco: None  . Alcohol Use: No  . Drug Use: No  . Sexually Active: Yes   Other Topics Concern  . None   Social History Narrative  . None   Family History  Problem Relation Age of Onset  . Hypertension Mother   . Heart disease Father   . Stroke Brother   . Multiple sclerosis Brother   . Multiple sclerosis Sister    Allergies  Allergen Reactions  . Sulfa Antibiotics Hives   Prior to Admission medications   Medication Sig Start Date End Date Taking? Authorizing Provider    albuterol (PROAIR HFA) 108 (90 BASE) MCG/ACT inhaler Inhale 2 puffs into the lungs every 6 (six) hours as needed.   Yes Historical Provider, MD  amLODipine (NORVASC) 5 MG tablet Take 1 tablet (5 mg total) by mouth daily. 12/27/11  Yes Peyton Najjar, MD  iron polysaccharides (NIFEREX) 150 MG capsule Take 1 capsule (150 mg total) by mouth 2 (two) times daily. 04/02/12  Yes Eleanore E Debbra Riding, PA-C  lansoprazole (PREVACID) 30 MG capsule Take 1 capsule (30 mg total) by mouth daily. 12/27/11  Yes Peyton Najjar, MD  losartan-hydrochlorothiazide (HYZAAR) 100-25 MG per tablet Take 1 tablet by mouth daily. 12/27/11  Yes Peyton Najjar, MD  metFORMIN (GLUCOPHAGE) 500 MG tablet Take 2 tablets (1,000 mg total) by mouth 2 (two) times daily with a meal. 12/27/11  Yes Peyton Najjar, MD  metoprolol succinate (TOPROL-XL) 100 MG 24 hr tablet Take 1 tablet (100 mg total) by mouth daily. Take with or immediately following a meal. 12/27/11  Yes Peyton Najjar, MD  pravastatin (PRAVACHOL) 80 MG tablet Take 1 tablet (80 mg total) by mouth daily. 12/27/11  Yes Peyton Najjar, MD     ROS: The patient denies fevers, chills, night sweats,  unintentional weight loss, chest pain, palpitations, wheezing, dyspnea on exertion, nausea, vomiting, abdominal pain, dysuria, hematuria, melena, numbness, weakness, or tingling.   All other systems have been reviewed and were otherwise negative with the exception of those mentioned in the HPI and as above.    PHYSICAL EXAM: Filed Vitals:   05/27/12 1614  BP: 170/94  Pulse: 103  Temp: 98.8 F (37.1 C)  Resp: 16   Filed Vitals:   05/27/12 1614  Height: 4\' 11"  (1.499 m)  Weight: 226 lb (102.513 kg)   Body mass index is 45.65 kg/(m^2).  General: Alert, no acute distress,obese AA female HEENT:  Normocephalic, atraumatic, oropharynx patent.  Cardiovascular:  Regular rate and rhythm, no rubs murmurs or gallops.  No Carotid bruits, radial pulse intact. No pedal edema.  Respiratory: Clear to  auscultation bilaterally.  No wheezes, rales, or rhonchi.  No cyanosis, no use of accessory musculature GI: No organomegaly, abdomen is soft and non-tender, positive bowel sounds.  No masses. Skin: No rashes. Neurologic: Facial musculature symmetric. Psychiatric: Patient is appropriate throughout our interaction. Lymphatic: No cervical lymphadenopathy Musculoskeletal: Gait intact. Low back-tender, no deformities, ROm intact Left hip-pain with ROM, but full ROM, 5/5 strength,  Sensation intact. She has pain from left greater trach but most of pain in upper hip, 2/2 DTR  LABS: Results for orders placed in visit on 05/27/12  POCT GLYCOSYLATED HEMOGLOBIN (HGB A1C)      Component Value Range   Hemoglobin A1C 7.3       EKG/XRAY:   Primary read interpreted by Dr. Conley Rolls at [facility]. + DJD , no fractures/subluxation   ASSESSMENT/PLAN: Encounter Diagnoses  Name Primary?  . Left hip pain Yes  . Diabetes   . Hypertension    Refilled chronic  meds Refer to sleep study, BP is too high for someone who is compliant with meds and on 4 different medications. She has sxs of OSA ie snoring, daytime fatigue, apneic spells per husband Refer to chronic pain for back and hip. Most of her joint problems can be attributed to probably her weith. She does have some DJD on xray but she is obese. Recommended PT and water aerobics, patient would like to defer all of this until she sees chronic pain clinic.  Rx  Celebrex ( has tried ibuprofen, aleve, mobic, advil) If not approved by insurance will try Tramadol, patient would like to be referred to pain clinic:  [facility]. Does not want PT right now. 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

## 2012-05-28 LAB — COMPREHENSIVE METABOLIC PANEL
ALT: 14 U/L (ref 0–35)
AST: 14 U/L (ref 0–37)
Albumin: 4.7 g/dL (ref 3.5–5.2)
Alkaline Phosphatase: 54 U/L (ref 39–117)
BUN: 12 mg/dL (ref 6–23)
CO2: 30 mEq/L (ref 19–32)
Calcium: 10.5 mg/dL (ref 8.4–10.5)
Chloride: 100 mEq/L (ref 96–112)
Creat: 0.79 mg/dL (ref 0.50–1.10)
Glucose, Bld: 88 mg/dL (ref 70–99)
Potassium: 3.7 mEq/L (ref 3.5–5.3)
Sodium: 140 mEq/L (ref 135–145)
Total Bilirubin: 0.3 mg/dL (ref 0.3–1.2)
Total Protein: 7.7 g/dL (ref 6.0–8.3)

## 2012-06-07 ENCOUNTER — Encounter: Payer: Self-pay | Admitting: Physical Medicine & Rehabilitation

## 2012-06-22 ENCOUNTER — Other Ambulatory Visit: Payer: Self-pay | Admitting: Occupational Medicine

## 2012-06-22 ENCOUNTER — Ambulatory Visit: Payer: Self-pay

## 2012-06-22 DIAGNOSIS — M549 Dorsalgia, unspecified: Secondary | ICD-10-CM

## 2012-06-23 ENCOUNTER — Ambulatory Visit: Payer: Commercial Managed Care - PPO | Admitting: Physical Medicine & Rehabilitation

## 2012-06-28 ENCOUNTER — Ambulatory Visit
Payer: PRIVATE HEALTH INSURANCE | Attending: Occupational Medicine | Admitting: Rehabilitative and Restorative Service Providers"

## 2012-06-28 DIAGNOSIS — M2569 Stiffness of other specified joint, not elsewhere classified: Secondary | ICD-10-CM | POA: Insufficient documentation

## 2012-06-28 DIAGNOSIS — M545 Low back pain, unspecified: Secondary | ICD-10-CM | POA: Insufficient documentation

## 2012-06-28 DIAGNOSIS — IMO0001 Reserved for inherently not codable concepts without codable children: Secondary | ICD-10-CM | POA: Insufficient documentation

## 2012-07-10 ENCOUNTER — Ambulatory Visit: Payer: PRIVATE HEALTH INSURANCE | Admitting: Rehabilitative and Restorative Service Providers"

## 2012-07-12 ENCOUNTER — Ambulatory Visit: Payer: PRIVATE HEALTH INSURANCE | Admitting: Physical Therapy

## 2012-07-12 ENCOUNTER — Encounter: Payer: Self-pay | Admitting: Rehabilitative and Restorative Service Providers"

## 2012-07-17 ENCOUNTER — Ambulatory Visit: Payer: PRIVATE HEALTH INSURANCE | Admitting: Physical Therapy

## 2012-07-20 ENCOUNTER — Ambulatory Visit: Payer: PRIVATE HEALTH INSURANCE | Admitting: Physical Therapy

## 2012-07-24 ENCOUNTER — Ambulatory Visit: Payer: PRIVATE HEALTH INSURANCE | Attending: Occupational Medicine | Admitting: Physical Therapy

## 2012-07-24 DIAGNOSIS — M545 Low back pain, unspecified: Secondary | ICD-10-CM | POA: Insufficient documentation

## 2012-07-24 DIAGNOSIS — M2569 Stiffness of other specified joint, not elsewhere classified: Secondary | ICD-10-CM | POA: Insufficient documentation

## 2012-07-24 DIAGNOSIS — IMO0001 Reserved for inherently not codable concepts without codable children: Secondary | ICD-10-CM | POA: Insufficient documentation

## 2012-07-26 ENCOUNTER — Ambulatory Visit: Payer: PRIVATE HEALTH INSURANCE | Attending: Occupational Medicine | Admitting: Physical Therapy

## 2012-07-26 DIAGNOSIS — IMO0001 Reserved for inherently not codable concepts without codable children: Secondary | ICD-10-CM | POA: Insufficient documentation

## 2012-07-26 DIAGNOSIS — M2569 Stiffness of other specified joint, not elsewhere classified: Secondary | ICD-10-CM | POA: Insufficient documentation

## 2012-07-26 DIAGNOSIS — M545 Low back pain, unspecified: Secondary | ICD-10-CM | POA: Insufficient documentation

## 2012-07-27 ENCOUNTER — Ambulatory Visit: Payer: PRIVATE HEALTH INSURANCE

## 2012-07-31 ENCOUNTER — Ambulatory Visit: Payer: PRIVATE HEALTH INSURANCE | Admitting: Physical Therapy

## 2012-08-02 ENCOUNTER — Ambulatory Visit: Payer: PRIVATE HEALTH INSURANCE | Admitting: Physical Therapy

## 2012-08-07 ENCOUNTER — Ambulatory Visit: Payer: PRIVATE HEALTH INSURANCE | Admitting: Physical Therapy

## 2012-08-09 ENCOUNTER — Ambulatory Visit: Payer: Self-pay | Admitting: Physical Therapy

## 2012-08-14 ENCOUNTER — Encounter: Payer: Self-pay | Admitting: Physical Therapy

## 2012-08-15 ENCOUNTER — Encounter: Payer: Self-pay | Admitting: Family Medicine

## 2012-08-16 ENCOUNTER — Encounter: Payer: Self-pay | Admitting: Physical Therapy

## 2012-08-21 ENCOUNTER — Encounter: Payer: Self-pay | Admitting: Physical Therapy

## 2012-10-09 ENCOUNTER — Ambulatory Visit (INDEPENDENT_AMBULATORY_CARE_PROVIDER_SITE_OTHER): Payer: 59 | Admitting: Family Medicine

## 2012-10-09 ENCOUNTER — Ambulatory Visit: Payer: 59

## 2012-10-09 VITALS — BP 156/100 | HR 90 | Temp 98.4°F | Resp 18 | Wt 223.0 lb

## 2012-10-09 DIAGNOSIS — J45909 Unspecified asthma, uncomplicated: Secondary | ICD-10-CM

## 2012-10-09 DIAGNOSIS — R062 Wheezing: Secondary | ICD-10-CM

## 2012-10-09 DIAGNOSIS — E119 Type 2 diabetes mellitus without complications: Secondary | ICD-10-CM

## 2012-10-09 DIAGNOSIS — I10 Essential (primary) hypertension: Secondary | ICD-10-CM

## 2012-10-09 DIAGNOSIS — J209 Acute bronchitis, unspecified: Secondary | ICD-10-CM

## 2012-10-09 DIAGNOSIS — R059 Cough, unspecified: Secondary | ICD-10-CM

## 2012-10-09 DIAGNOSIS — J45901 Unspecified asthma with (acute) exacerbation: Secondary | ICD-10-CM

## 2012-10-09 DIAGNOSIS — R05 Cough: Secondary | ICD-10-CM

## 2012-10-09 DIAGNOSIS — J029 Acute pharyngitis, unspecified: Secondary | ICD-10-CM

## 2012-10-09 DIAGNOSIS — D649 Anemia, unspecified: Secondary | ICD-10-CM

## 2012-10-09 LAB — POCT CBC
Granulocyte percent: 49.5 %G (ref 37–80)
HCT, POC: 39.2 % (ref 37.7–47.9)
Hemoglobin: 11.7 g/dL — AB (ref 12.2–16.2)
Lymph, poc: 2.7 (ref 0.6–3.4)
MCH, POC: 23.7 pg — AB (ref 27–31.2)
MCHC: 29.8 g/dL — AB (ref 31.8–35.4)
MCV: 79.3 fL — AB (ref 80–97)
MID (cbc): 0.5 (ref 0–0.9)
MPV: 10.4 fL (ref 0–99.8)
POC Granulocyte: 3.1 (ref 2–6.9)
POC LYMPH PERCENT: 42.2 %L (ref 10–50)
POC MID %: 8.3 %M (ref 0–12)
Platelet Count, POC: 313 10*3/uL (ref 142–424)
RBC: 4.94 M/uL (ref 4.04–5.48)
RDW, POC: 20.1 %
WBC: 6.3 10*3/uL (ref 4.6–10.2)

## 2012-10-09 LAB — POCT GLYCOSYLATED HEMOGLOBIN (HGB A1C): Hemoglobin A1C: 6.8

## 2012-10-09 LAB — GLUCOSE, POCT (MANUAL RESULT ENTRY): POC Glucose: 118 mg/dl — AB (ref 70–99)

## 2012-10-09 MED ORDER — METHYLPREDNISOLONE ACETATE 80 MG/ML IJ SUSP
80.0000 mg | Freq: Once | INTRAMUSCULAR | Status: AC
  Administered 2012-10-09: 80 mg via INTRAMUSCULAR

## 2012-10-09 MED ORDER — IPRATROPIUM BROMIDE 0.02 % IN SOLN
0.5000 mg | Freq: Once | RESPIRATORY_TRACT | Status: AC
  Administered 2012-10-09: 0.5 mg via RESPIRATORY_TRACT

## 2012-10-09 MED ORDER — AZITHROMYCIN 250 MG PO TABS: ORAL_TABLET | ORAL | Status: DC

## 2012-10-09 MED ORDER — HYDROCOD POLST-CHLORPHEN POLST 10-8 MG/5ML PO LQCR: 5.0000 mL | Freq: Two times a day (BID) | ORAL | Status: DC | PRN

## 2012-10-09 MED ORDER — ALBUTEROL SULFATE HFA 108 (90 BASE) MCG/ACT IN AERS: 2.0000 | INHALATION_SPRAY | Freq: Four times a day (QID) | RESPIRATORY_TRACT | Status: DC | PRN

## 2012-10-09 MED ORDER — ALBUTEROL SULFATE (2.5 MG/3ML) 0.083% IN NEBU
2.5000 mg | INHALATION_SOLUTION | Freq: Once | RESPIRATORY_TRACT | Status: AC
  Administered 2012-10-09: 2.5 mg via RESPIRATORY_TRACT

## 2012-10-09 NOTE — Patient Instructions (Addendum)
Acute Bronchitis You have acute bronchitis. This means you have a chest cold. The airways in your lungs are red and sore (inflamed). Acute means it is sudden onset.  CAUSES Bronchitis is most often caused by the same virus that causes a cold. SYMPTOMS   Body aches.  Chest congestion.  Chills.  Cough.  Fever.  Shortness of breath.  Sore throat. TREATMENT  Acute bronchitis is usually treated with rest, fluids, and medicines for relief of fever or cough. Most symptoms should go away after a few days or a week. Increased fluids may help thin your secretions and will prevent dehydration. Your caregiver may give you an inhaler to improve your symptoms. The inhaler reduces shortness of breath and helps control cough. You can take over-the-counter pain relievers or cough medicine to decrease coughing, pain, or fever. A cool-air vaporizer may help thin bronchial secretions and make it easier to clear your chest. Antibiotics are usually not needed but can be prescribed if you smoke, are seriously ill, have chronic lung problems, are elderly, or you are at higher risk for developing complications.Allergies and asthma can make bronchitis worse. Repeated episodes of bronchitis may cause longstanding lung problems. Avoid smoking and secondhand smoke.Exposure to cigarette smoke or irritating chemicals will make bronchitis worse. If you are a cigarette smoker, consider using nicotine gum or skin patches to help control withdrawal symptoms. Quitting smoking will help your lungs heal faster. Recovery from bronchitis is often slow, but you should start feeling better after 2 to 3 days. Cough from bronchitis frequently lasts for 3 to 4 weeks. To prevent another bout of acute bronchitis:  Quit smoking.  Wash your hands frequently to get rid of viruses or use a hand sanitizer.  Avoid other people with cold or virus symptoms.  Try not to touch your hands to your mouth, nose, or eyes. SEEK IMMEDIATE  MEDICAL CARE IF:  You develop increased fever, chills, or chest pain.  You have severe shortness of breath or bloody sputum.  You develop dehydration, fainting, repeated vomiting, or a severe headache.  You have no improvement after 1 week of treatment or you get worse. MAKE SURE YOU:   Understand these instructions.  Will watch your condition.  Will get help right away if you are not doing well or get worse. Document Released: 06/17/2004 Document Revised: 08/02/2011 Document Reviewed: 09/02/2010 Exit[employer] Patient Information 2013 [employer]   Asthma, Acute Bronchospasm Your exam shows you have asthma, or acute bronchospasm that acts like asthma. Bronchospasm means your air passages become narrowed. These conditions are due to inflammation and airway spasm that cause narrowing of the bronchial tubes in the lungs. This causes you to have wheezing and shortness of breath. CAUSES  Respiratory infections and allergies most often bring on these attacks. Smoking, air pollution, cold air, emotional upsets, and vigorous exercise can also bring them on.  TREATMENT   Treatment is aimed at making the narrowed airways larger. Mild asthma/bronchospasm is usually controlled with inhaled medicines. Albuterol is a common medicine that you breathe in to open spastic or narrowed airways. Some trade names for albuterol are Ventolin or Proventil. Steroid medicine is also used to reduce the inflammation when an attack is moderate or severe. Antibiotics (medications used to kill germs) are only used if a bacterial infection is present.  If you are pregnant and need to use Albuterol (Ventolin or Proventil), you can expect the baby to move more than usual shortly after the medicine is used. HOME CARE INSTRUCTIONS     Rest.  Drink plenty of liquids. This helps the mucus to remain thin and easily coughed up. Do not use caffeine or alcohol.  Do not smoke. Avoid being exposed to second-hand smoke.  You  play a critical role in keeping yourself in good health. Avoid exposure to things that cause you to wheeze. Avoid exposure to things that cause you to have breathing problems. Keep your medications up-to-date and available. Carefully follow your doctor's treatment plan.  When pollen or pollution is bad, keep windows closed and use an air conditioner go to places with air conditioning. If you are allergic to furry pets or birds, find new homes for them or keep them outside.  Take your medicine exactly as prescribed.  Asthma requires careful medical attention. See your caregiver for follow-up as advised. If you are more than [redacted] weeks pregnant and you were prescribed any new medications, let your Obstetrician know about the visit and how you are doing. Arrange a recheck. SEEK IMMEDIATE MEDICAL CARE IF:   You are getting worse.  You have trouble breathing. If severe, call 911.  You develop chest pain or discomfort.  You are throwing up or not drinking fluids.  You are not getting better within 24 hours.  You are coughing up yellow, green, brown, or bloody sputum.  You develop a fever over 102 F (38.9 C).  You have trouble swallowing. MAKE SURE YOU:   Understand these instructions.  Will watch your condition.  Will get help right away if you are not doing well or get worse. Document Released: 08/25/2006 Document Revised: 08/02/2011 Document Reviewed: 04/24/2007 [employer] Patient Information 2013 [employer].  

## 2012-10-09 NOTE — Progress Notes (Signed)
Subjective:    Patient ID: [name], female    DOB: 06-05-1955, 57 y.o.   MRN: [id number] Chief Complaint  Patient presents with  . URI  . Wheezing  . Cough    HPI  Feels like she has the flu - a lot wheezing, congestion, sore throat, not much sneezing - coughing up white phlegm.  No shob or CP. No f/c.  Has been ill for 5d and using nyquil at night and alka-seltzer plus during the day but not with the usual relief she feels.  Can feel sinus pressure and popping though minimal rhinitis. Wheezing  - does have a h/o asthma with illness and seasonal allergies.  Not smoking.  Has an albuterol inhaler at home but didn't think to use it. Not sleeping at all due to sxs - congestion, post-nasal drip, cough all keeping her awake.  PCP [facility].  Does not want DM checked today - will return for this when she is feeling better.    Long-standing h/o Anemia - seen at cancer center 8-12 yrs ago - was told everything normal after tons of expensive labs tests - told her cause of anemia unknown but instructed to take iron - initially twice a day but now has gone down to once a day.  Going to ob-gyn appt today for physical- hasn't been there in 3 yrs so planning to get pap UTD.  Has been post-menopausal for a long time w/ no vaginal or rectal bleeding.  Past Medical History  Diagnosis Date  . DM type 2 (diabetes mellitus, type 2)   . HTN (hypertension)   . Hypercholesterolemia   . Reflux   . Asthma    Current Outpatient Prescriptions on File Prior to Visit  Medication Sig Dispense Refill  . amLODipine (NORVASC) 5 MG tablet Take 1 tablet (5 mg total) by mouth daily.  90 tablet  1  . celecoxib (CELEBREX) 200 MG capsule Take 1 capsule (200 mg total) by mouth daily.  30 capsule  5  . iron polysaccharides (NIFEREX) 150 MG capsule Take 1 capsule (150 mg total) by mouth 2 (two) times daily.  60 capsule  2  . lansoprazole (PREVACID) 30 MG capsule Take 1 capsule (30 mg total) by mouth daily.  90 capsule   1  . losartan-hydrochlorothiazide (HYZAAR) 100-25 MG per tablet Take 1 tablet by mouth daily.  90 tablet  1  . metFORMIN (GLUCOPHAGE) 500 MG tablet Take 2 tablets (1,000 mg total) by mouth 2 (two) times daily with a meal.  360 tablet  1  . metoprolol succinate (TOPROL-XL) 100 MG 24 hr tablet Take 1 tablet (100 mg total) by mouth daily. Take with or immediately following a meal.  90 tablet  1  . pravastatin (PRAVACHOL) 80 MG tablet Take 1 tablet (80 mg total) by mouth daily.  90 tablet  1   No current facility-administered medications on file prior to visit.   Allergies  Allergen Reactions  . Sulfa Antibiotics Hives     Review of Systems  Constitutional: Positive for diaphoresis, activity change, appetite change and fatigue. Negative for fever and chills.  HENT: Positive for congestion, sore throat, rhinorrhea, neck pain, voice change, postnasal drip and sinus pressure. Negative for ear pain, nosebleeds, sneezing, trouble swallowing and ear discharge.   Eyes: Negative for discharge and itching.  Respiratory: Positive for cough, chest tightness, wheezing and stridor. Negative for shortness of breath.   Cardiovascular: Positive for leg swelling. Negative for chest pain.  Gastrointestinal: Negative  for nausea, vomiting, abdominal pain, blood in stool and anal bleeding.  Endocrine:       Post-menopausal  Genitourinary: Negative for vaginal bleeding and menstrual problem.  Musculoskeletal: Positive for myalgias, back pain and arthralgias.  Skin: Negative for rash.  Neurological: Positive for headaches. Negative for dizziness, syncope, weakness and light-headedness.  Hematological: Negative for adenopathy.  Psychiatric/Behavioral: Positive for sleep disturbance.      BP 156/100  Pulse 90  Temp(Src) 98.4 F (36.9 C) (Oral)  Resp 18  Wt 223 lb (101.152 kg)  BMI 45.02 kg/m2  SpO2 98% Objective:   Physical Exam  Constitutional: She is oriented to person, place, and time. She appears  well-developed and well-nourished. She does not appear ill. No distress.  HENT:  Head: Normocephalic and atraumatic.  Right Ear: External ear and ear canal normal. Tympanic membrane is retracted. A middle ear effusion is present.  Left Ear: External ear and ear canal normal. Tympanic membrane is retracted. A middle ear effusion is present.  Nose: Mucosal edema and rhinorrhea present. Right sinus exhibits maxillary sinus tenderness. Left sinus exhibits maxillary sinus tenderness.  Mouth/Throat: Uvula is midline and mucous membranes are normal. Posterior oropharyngeal erythema present. No oropharyngeal exudate, posterior oropharyngeal edema or tonsillar abscesses.  Eyes: Conjunctivae are normal. Right eye exhibits no discharge. Left eye exhibits no discharge. No scleral icterus.  Neck: Normal range of motion. Neck supple.  Cardiovascular: Normal rate, regular rhythm, normal heart sounds and intact distal pulses.   Pulmonary/Chest: Accessory muscle usage present. Not tachypneic. No respiratory distress. She has decreased breath sounds. She has wheezes. She has rhonchi in the left lower field. She has rales in the right lower field and the left lower field.  Audible upper airway/throat congestion during entire history and exam with soft stridor-type sound when breathing through mouth  Lymphadenopathy:       Head (right side): No submandibular, no preauricular and no posterior auricular adenopathy present.       Head (left side): No submandibular, no preauricular and no posterior auricular adenopathy present.    She has no cervical adenopathy.       Right: No supraclavicular adenopathy present.       Left: No supraclavicular adenopathy present.  Neurological: She is alert and oriented to person, place, and time.  Skin: Skin is warm and dry. She is not diaphoretic. No erythema.  Psychiatric: She has a normal mood and affect. Her behavior is normal.     Results for orders placed in visit on 10/09/12   POCT CBC      Result Value Range   WBC 6.3  4.6 - 10.2 K/uL   Lymph, poc 2.7  0.6 - 3.4   POC LYMPH PERCENT 42.2  10 - 50 %L   MID (cbc) 0.5  0 - 0.9   POC MID % 8.3  0 - 12 %M   POC Granulocyte 3.1  2 - 6.9   Granulocyte percent 49.5  37 - 80 %G   RBC 4.94  4.04 - 5.48 M/uL   Hemoglobin 11.7 (*) 12.2 - 16.2 g/dL   HCT, POC 84.6  96.2 - 47.9 %   MCV 79.3 (*) 80 - 97 fL   MCH, POC 23.7 (*) 27 - 31.2 pg   MCHC 29.8 (*) 31.8 - 35.4 g/dL   RDW, POC 95.2     Platelet Count, POC 313  142 - 424 K/uL   MPV 10.4  0 - 99.8 fL  GLUCOSE, POCT (MANUAL RESULT  ENTRY)      Result Value Range   POC Glucose 118 (*) 70 - 99 mg/dl  POCT GLYCOSYLATED HEMOGLOBIN (HGB A1C)      Result Value Range   Hemoglobin A1C 6.8        [facility] reading (PRIMARY) by  Dr. Clelia Croft. Small amount of Bibasilar consildation - bronchitic changes - RADIOLOGY OVERREAD - POSS RLL INFILTRATE Assessment & Plan:  Cough - Plan: POCT CBC, DG Chest 2 View, POCT glucose (manual entry), albuterol (PROVENTIL) (2.5 MG/3ML) 0.083% nebulizer solution 2.5 mg, ipratropium (ATROVENT) nebulizer solution 0.5 mg  Anemia - Plan: Iron and TIBC, Ferritin, - chronic and unknown etiology, continue iron for now though no known cause for IDA.  Acute bronchitis - cover w/ zpack, depomedrol IM x 1 now, and prn albuterol.  Try tussionex at night to help sleep.  Asthma with acute exacerbation - Plan:  albuterol (PROVENTIL) (2.5 MG/3ML) 0.083% nebulizer solution 2.5 mg, ipratropium (ATROVENT) nebulizer solution 0.5 mg, methylPREDNISolone acetate (DEPO-MEDROL) injection 80 mg IM x 1 given in office along with DuoNeb treatment in office. Start albuterol inhaler  Type II or unspecified type diabetes mellitus without mention of complication, not stated as uncontrolled - Plan: POCT glycosylated hemoglobin (Hb A1C) - a1c improved from prior of 7.3. Warned that steroid injection will increase cbgs but pt not checking outside of office.  Pt plans to f/u in 1-2 mos  for med refills.  Unspecified essential hypertension - elevated today - likely due to illness and otc decongestants which pt was reminded NOT to use.  Recheck at next OV in 1-2 mos to f/u on chronic medical problems. Meds ordered this encounter  Medications  . albuterol (PROVENTIL) (2.5 MG/3ML) 0.083% nebulizer solution 2.5 mg    Sig:   . ipratropium (ATROVENT) nebulizer solution 0.5 mg    Sig:   . albuterol (PROAIR HFA) 108 (90 BASE) MCG/ACT inhaler    Sig: Inhale 2 puffs into the lungs every 6 (six) hours as needed.    Dispense:  1 Inhaler    Refill:  6  . chlorpheniramine-HYDROcodone (TUSSIONEX PENNKINETIC ER) 10-8 MG/5ML LQCR    Sig: Take 5 mLs by mouth every 12 (twelve) hours as needed (cough).    Dispense:  140 mL    Refill:  0  . azithromycin (ZITHROMAX) 250 MG tablet    Sig: Take 2 tabs PO x 1 dose, then 1 tab PO QD x 4 days    Dispense:  6 tablet    Refill:  0  . methylPREDNISolone acetate (DEPO-MEDROL) injection 80 mg    Sig:

## 2012-10-10 LAB — IRON AND TIBC
%SAT: 12 % — ABNORMAL LOW (ref 20–55)
Iron: 39 ug/dL — ABNORMAL LOW (ref 42–145)
TIBC: 325 ug/dL (ref 250–470)
UIBC: 286 ug/dL (ref 125–400)

## 2012-10-10 LAB — FERRITIN: Ferritin: 89 ng/mL (ref 10–291)

## 2012-10-16 ENCOUNTER — Encounter: Payer: Self-pay | Admitting: *Deleted

## 2012-10-21 ENCOUNTER — Telehealth: Payer: Self-pay

## 2012-10-21 NOTE — Telephone Encounter (Signed)
Patient notified and voiced understanding. States she can not take OTC Fe. She is on a prescription Fe and if that needs to be changed to a different rx then is that possible? Please advsie

## 2012-10-21 NOTE — Telephone Encounter (Signed)
PT is calling back saying she got a letter saying that someone was trying to contact her She updated her new number it is [phone]

## 2012-10-23 NOTE — Telephone Encounter (Signed)
No she just might want to go back up to twice a day on her iron supp from qd. Take this on an empty stomach 30 minutes before eating or 2 hours after a meal.  Take it with a vitamin C supplement or a small glass of orange juice

## 2012-10-24 NOTE — Telephone Encounter (Signed)
Called her to advise. Left message for her to call me back.  

## 2012-10-25 NOTE — Telephone Encounter (Signed)
Called again. She is advised.

## 2012-11-20 ENCOUNTER — Other Ambulatory Visit: Payer: Self-pay | Admitting: Family Medicine

## 2012-11-28 ENCOUNTER — Ambulatory Visit (INDEPENDENT_AMBULATORY_CARE_PROVIDER_SITE_OTHER): Payer: 59 | Admitting: Family Medicine

## 2012-11-28 ENCOUNTER — Other Ambulatory Visit: Payer: Self-pay | Admitting: Family Medicine

## 2012-11-28 VITALS — BP 136/85 | HR 71 | Temp 99.0°F | Resp 18 | Wt 222.0 lb

## 2012-11-28 DIAGNOSIS — M25559 Pain in unspecified hip: Secondary | ICD-10-CM

## 2012-11-28 DIAGNOSIS — E119 Type 2 diabetes mellitus without complications: Secondary | ICD-10-CM

## 2012-11-28 DIAGNOSIS — E7800 Pure hypercholesterolemia, unspecified: Secondary | ICD-10-CM

## 2012-11-28 DIAGNOSIS — N760 Acute vaginitis: Secondary | ICD-10-CM

## 2012-11-28 DIAGNOSIS — M25552 Pain in left hip: Secondary | ICD-10-CM

## 2012-11-28 DIAGNOSIS — M791 Myalgia, unspecified site: Secondary | ICD-10-CM

## 2012-11-28 DIAGNOSIS — D649 Anemia, unspecified: Secondary | ICD-10-CM

## 2012-11-28 DIAGNOSIS — K219 Gastro-esophageal reflux disease without esophagitis: Secondary | ICD-10-CM

## 2012-11-28 DIAGNOSIS — E785 Hyperlipidemia, unspecified: Secondary | ICD-10-CM

## 2012-11-28 DIAGNOSIS — IMO0001 Reserved for inherently not codable concepts without codable children: Secondary | ICD-10-CM

## 2012-11-28 DIAGNOSIS — Z79899 Other long term (current) drug therapy: Secondary | ICD-10-CM

## 2012-11-28 DIAGNOSIS — I10 Essential (primary) hypertension: Secondary | ICD-10-CM

## 2012-11-28 LAB — POCT CBC
Granulocyte percent: 59.1 %G (ref 37–80)
HCT, POC: 38.5 % (ref 37.7–47.9)
Hemoglobin: 11.6 g/dL — AB (ref 12.2–16.2)
Lymph, poc: 2.8 (ref 0.6–3.4)
MCH, POC: 23.9 pg — AB (ref 27–31.2)
MCHC: 30.1 g/dL — AB (ref 31.8–35.4)
MCV: 79.2 fL — AB (ref 80–97)
MID (cbc): 0.6 (ref 0–0.9)
MPV: 9.1 fL (ref 0–99.8)
POC Granulocyte: 4.8 (ref 2–6.9)
POC LYMPH PERCENT: 34.1 %L (ref 10–50)
POC MID %: 6.8 %M (ref 0–12)
Platelet Count, POC: 343 10*3/uL (ref 142–424)
RBC: 4.86 M/uL (ref 4.04–5.48)
RDW, POC: 19.1 %
WBC: 8.1 10*3/uL (ref 4.6–10.2)

## 2012-11-28 LAB — POCT UA - MICROSCOPIC ONLY
Casts, Ur, LPF, POC: NEGATIVE
Crystals, Ur, HPF, POC: NEGATIVE
Yeast, UA: NEGATIVE

## 2012-11-28 LAB — POCT WET PREP WITH KOH
KOH Prep POC: NEGATIVE
RBC Wet Prep HPF POC: NEGATIVE
Trichomonas, UA: NEGATIVE
Yeast Wet Prep HPF POC: NEGATIVE

## 2012-11-28 LAB — POCT URINALYSIS DIPSTICK
Blood, UA: NEGATIVE
Glucose, UA: NEGATIVE
Nitrite, UA: NEGATIVE
Protein, UA: 30
Spec Grav, UA: 1.02
Urobilinogen, UA: 0.2
pH, UA: 5

## 2012-11-28 MED ORDER — CELECOXIB 200 MG PO CAPS: 200.0000 mg | ORAL_CAPSULE | Freq: Every day | ORAL | Status: DC

## 2012-11-28 MED ORDER — POLYSACCHARIDE IRON COMPLEX 150 MG PO CAPS: 150.0000 mg | ORAL_CAPSULE | Freq: Two times a day (BID) | ORAL | Status: DC

## 2012-11-28 MED ORDER — METOPROLOL SUCCINATE ER 100 MG PO TB24: ORAL_TABLET | ORAL | Status: DC

## 2012-11-28 MED ORDER — AMLODIPINE BESYLATE 10 MG PO TABS: 10.0000 mg | ORAL_TABLET | Freq: Every day | ORAL | Status: DC

## 2012-11-28 MED ORDER — PRAVASTATIN SODIUM 80 MG PO TABS: 80.0000 mg | ORAL_TABLET | Freq: Every day | ORAL | Status: DC

## 2012-11-28 MED ORDER — METFORMIN HCL 1000 MG PO TABS: 1000.0000 mg | ORAL_TABLET | Freq: Two times a day (BID) | ORAL | Status: DC

## 2012-11-28 MED ORDER — LANSOPRAZOLE 30 MG PO CPDR: 30.0000 mg | DELAYED_RELEASE_CAPSULE | Freq: Every day | ORAL | Status: DC

## 2012-11-28 MED ORDER — LOSARTAN POTASSIUM-HCTZ 100-25 MG PO TABS: 1.0000 | ORAL_TABLET | Freq: Every day | ORAL | Status: DC

## 2012-11-28 MED ORDER — FLUCONAZOLE 150 MG PO TABS: 150.0000 mg | ORAL_TABLET | Freq: Once | ORAL | Status: DC

## 2012-11-28 NOTE — Progress Notes (Addendum)
Subjective:    Patient ID: [name], female    DOB: 1955-07-16, 57 y.o.   MRN: [id number]   Chief Complaint  Patient presents with  . Diabetes    needs rx refills  . Hypertension  . leg cramps    also in stomach    HPI  Does not check cbgs or BP outside office.  Tolerating all medications and not missing any doses.  Occasionally she has muscle spasms - none this week but last week she had several in her right calf and right abdomen and last month also had some in her right abdomen.  Drinking a lot water and voiding normally.  Has been having a little constipation from increasing her iron level to twice a day.  Fasting other than 3 nabs in the car on the way here - just got to hungry to wait.  Recurrent vaginal yeast infections, currently symptom free.   Past Medical History  Diagnosis Date  . DM type 2 (diabetes mellitus, type 2)   . HTN (hypertension)   . Hypercholesterolemia   . Reflux   . Anemia   . Arthritis   . Heart murmur    Current Outpatient Prescriptions on File Prior to Visit  Medication Sig Dispense Refill  . albuterol (PROAIR HFA) 108 (90 BASE) MCG/ACT inhaler Inhale 2 puffs into the lungs every 6 (six) hours as needed.  1 Inhaler  6  . amLODipine (NORVASC) 5 MG tablet Take 1 tablet (5 mg total) by mouth daily.  90 tablet  1  . celecoxib (CELEBREX) 200 MG capsule Take 1 capsule (200 mg total) by mouth daily.  30 capsule  5  . iron polysaccharides (NIFEREX) 150 MG capsule Take 1 capsule (150 mg total) by mouth 2 (two) times daily.  60 capsule  2  . lansoprazole (PREVACID) 30 MG capsule Take 1 capsule (30 mg total) by mouth daily.  90 capsule  1  . losartan-hydrochlorothiazide (HYZAAR) 100-25 MG per tablet Take 1 tablet by mouth daily.  90 tablet  1  . metFORMIN (GLUCOPHAGE) 500 MG tablet Take 2 tablets (1,000 mg total) by mouth 2 (two) times daily with a meal.  360 tablet  1  . metoprolol succinate (TOPROL-XL) 100 MG 24 hr tablet TAKE 1 TABLET (100 MG TOTAL)  BY MOUTH DAILY. TAKE WITH OR IMMEDIATELY FOLLOWING A MEAL.  90 tablet  1  . pravastatin (PRAVACHOL) 80 MG tablet Take 1 tablet (80 mg total) by mouth daily.  90 tablet  1   No current facility-administered medications on file prior to visit.   Allergies  Allergen Reactions  . Sulfa Antibiotics Hives     Review of Systems  Constitutional: Negative for fever, chills, diaphoresis, activity change, appetite change, fatigue and unexpected weight change.  Eyes: Negative for visual disturbance.  Respiratory: Negative for cough and shortness of breath.   Cardiovascular: Negative for chest pain, palpitations and leg swelling.  Gastrointestinal: Positive for abdominal pain and constipation. Negative for diarrhea, blood in stool, anal bleeding and rectal pain.  Genitourinary: Positive for vaginal discharge. Negative for dysuria, urgency, frequency, hematuria, decreased urine volume, difficulty urinating, genital sores, vaginal pain, menstrual problem, pelvic pain and dyspareunia.  Musculoskeletal: Positive for arthralgias and myalgias.  Skin: Negative for rash.  Neurological: Negative for syncope and headaches.  Hematological: Negative for adenopathy. Does not bruise/bleed easily.  Psychiatric/Behavioral: The patient is not nervous/anxious.       BP 136/85  Pulse 71  Temp(Src) 99 F (37.2 C) (  Oral)  Resp 18  Wt 222 lb (100.699 kg)  BMI 44.81 kg/m2 Objective:   Physical Exam  Constitutional: She is oriented to person, place, and time. She appears well-developed and well-nourished. No distress.  HENT:  Head: Normocephalic and atraumatic.  Right Ear: External ear normal.  Left Ear: External ear normal.  Eyes: Conjunctivae are normal. No scleral icterus.  Neck: Normal range of motion. Neck supple. No thyromegaly present.  Cardiovascular: Normal rate, regular rhythm, normal heart sounds and intact distal pulses.   Pulmonary/Chest: Effort normal and breath sounds normal. No respiratory  distress.  Abdominal: Soft. Bowel sounds are normal. She exhibits no distension. There is no tenderness. There is no rebound and no guarding.  Genitourinary: Uterus normal. Pelvic exam was performed with patient supine. There is no rash, tenderness or lesion on the right labia. There is no rash, tenderness or lesion on the left labia. Cervix exhibits no motion tenderness and no friability. Right adnexum displays no mass, no tenderness and no fullness. Left adnexum displays no mass, no tenderness and no fullness. No erythema or tenderness around the vagina. Vaginal discharge found.  Musculoskeletal: She exhibits no edema.  Lymphadenopathy:    She has no cervical adenopathy.       Right: No inguinal adenopathy present.       Left: No inguinal adenopathy present.  Neurological: She is alert and oriented to person, place, and time.  Skin: Skin is warm and dry. She is not diaphoretic. No erythema.  Psychiatric: She has a normal mood and affect. Her behavior is normal.          Results for orders placed in visit on 11/28/12  POCT CBC      Result Value Range   WBC 8.1  4.6 - 10.2 K/uL   Lymph, poc 2.8  0.6 - 3.4   POC LYMPH PERCENT 34.1  10 - 50 %L   MID (cbc) 0.6  0 - 0.9   POC MID % 6.8  0 - 12 %M   POC Granulocyte 4.8  2 - 6.9   Granulocyte percent 59.1  37 - 80 %G   RBC 4.86  4.04 - 5.48 M/uL   Hemoglobin 11.6 (*) 12.2 - 16.2 g/dL   HCT, POC 04.5  40.9 - 47.9 %   MCV 79.2 (*) 80 - 97 fL   MCH, POC 23.9 (*) 27 - 31.2 pg   MCHC 30.1 (*) 31.8 - 35.4 g/dL   RDW, POC 81.1     Platelet Count, POC 343  142 - 424 K/uL   MPV 9.1  0 - 99.8 fL  POCT UA - MICROSCOPIC ONLY      Result Value Range   WBC, Ur, HPF, POC 3-6     RBC, urine, microscopic 0-1     Bacteria, U Microscopic trace     Mucus, UA trace     Epithelial cells, urine per micros 1-3     Crystals, Ur, HPF, POC neg     Casts, Ur, LPF, POC neg     Yeast, UA neg    POCT URINALYSIS DIPSTICK      Result Value Range   Color, UA  yellow     Clarity, UA clear     Glucose, UA neg     Bilirubin, UA small     Ketones, UA trace     Spec Grav, UA 1.020     Blood, UA neg     pH, UA 5.0  Protein, UA 30     Urobilinogen, UA 0.2     Nitrite, UA neg     Leukocytes, UA small (1+)    POCT WET PREP WITH KOH      Result Value Range   Trichomonas, UA Negative     Clue Cells Wet Prep HPF POC 0-2     Epithelial Wet Prep HPF POC 1-6     Yeast Wet Prep HPF POC neg     Bacteria Wet Prep HPF POC trace     RBC Wet Prep HPF POC neg     WBC Wet Prep HPF POC 0-4     KOH Prep POC Negative      Assessment & Plan:  Myalgia - Plan: POCT CBC, POCT UA - Microscopic Only, POCT urinalysis dipstick, POCT Wet Prep with KOH, Microalbumin, urine, Lipid panel, Iron and TIBC, CK, Comprehensive metabolic panel, Magnesium, TSH  Anemia - Plan: POCT CBC, POCT UA - Microscopic Only, POCT urinalysis dipstick, POCT Wet Prep with KOH, Microalbumin, urine, Lipid panel, Iron and TIBC, CK, Comprehensive metabolic panel, Magnesium, TSH, iron polysaccharides (NIFEREX) 150 MG capsule  Type II or unspecified type diabetes mellitus without mention of complication, not stated as uncontrolled - Plan: POCT CBC, POCT UA - Microscopic Only, POCT urinalysis dipstick, POCT Wet Prep with KOH, Microalbumin, urine, Lipid panel, Iron and TIBC, CK, Comprehensive metabolic panel, Magnesium, TSH  Encounter for long-term (current) use of other medications - Plan: POCT CBC, POCT UA - Microscopic Only, POCT urinalysis dipstick, POCT Wet Prep with KOH, Microalbumin, urine, Lipid panel, Iron and TIBC, CK, Comprehensive metabolic panel, Magnesium, TSH  Other and unspecified hyperlipidemia - Plan: POCT CBC, POCT UA - Microscopic Only, POCT urinalysis dipstick, POCT Wet Prep with KOH, Microalbumin, urine, Lipid panel, Iron and TIBC, CK, Comprehensive metabolic panel, Magnesium, TSH  Vaginitis and vulvovaginitis - Plan: POCT CBC, POCT UA - Microscopic Only, POCT urinalysis  dipstick, POCT Wet Prep with KOH, Microalbumin, urine, Lipid panel, Iron and TIBC, CK, Comprehensive metabolic panel, Magnesium, TSH  HTN (hypertension) - Plan: losartan-hydrochlorothiazide (HYZAAR) 100-25 MG per tablet, amLODipine (NORVASC) 10 MG tablet  Left hip pain - Plan: celecoxib (CELEBREX) 200 MG capsule  GERD (gastroesophageal reflux disease) - Plan: lansoprazole (PREVACID) 30 MG capsule  Hypercholesteremia - Plan: pravastatin (PRAVACHOL) 80 MG tablet     All meds refilled - amlodipine increased from 5 to 10. Still iron deficient so cont iron supp.  Meds ordered this encounter  Medications  . losartan-hydrochlorothiazide (HYZAAR) 100-25 MG per tablet    Sig: Take 1 tablet by mouth daily.    Dispense:  90 tablet    Refill:  1  . iron polysaccharides (NIFEREX) 150 MG capsule    Sig: Take 1 capsule (150 mg total) by mouth 2 (two) times daily.    Dispense:  180 capsule    Refill:  1  . celecoxib (CELEBREX) 200 MG capsule    Sig: Take 1 capsule (200 mg total) by mouth daily.    Dispense:  90 capsule    Refill:  1  . lansoprazole (PREVACID) 30 MG capsule    Sig: Take 1 capsule (30 mg total) by mouth daily.    Dispense:  90 capsule    Refill:  1  . pravastatin (PRAVACHOL) 80 MG tablet    Sig: Take 1 tablet (80 mg total) by mouth at bedtime.    Dispense:  90 tablet    Refill:  1  . metoprolol succinate (TOPROL-XL) 100 MG 24  hr tablet    Sig: TAKE 1 TABLET (100 MG TOTAL) BY MOUTH DAILY. TAKE WITH OR IMMEDIATELY FOLLOWING A MEAL.    Dispense:  90 tablet    Refill:  1  . amLODipine (NORVASC) 10 MG tablet    Sig: Take 1 tablet (10 mg total) by mouth daily.    Dispense:  90 tablet    Refill:  1  . metFORMIN (GLUCOPHAGE) 1000 MG tablet    Sig: Take 1 tablet (1,000 mg total) by mouth 2 (two) times daily with a meal.    Dispense:  180 tablet    Refill:  1  . fluconazole (DIFLUCAN) 150 MG tablet    Sig: Take 1 tablet (150 mg total) by mouth once. For yeast infection     Dispense:  1 tablet    Refill:  1

## 2012-11-29 LAB — COMPREHENSIVE METABOLIC PANEL
ALT: 17 U/L (ref 0–35)
AST: 13 U/L (ref 0–37)
Albumin: 4.7 g/dL (ref 3.5–5.2)
Alkaline Phosphatase: 64 U/L (ref 39–117)
BUN: 16 mg/dL (ref 6–23)
CO2: 30 mEq/L (ref 19–32)
Calcium: 10.4 mg/dL (ref 8.4–10.5)
Chloride: 98 mEq/L (ref 96–112)
Creat: 0.82 mg/dL (ref 0.50–1.10)
Glucose, Bld: 111 mg/dL — ABNORMAL HIGH (ref 70–99)
Potassium: 4 mEq/L (ref 3.5–5.3)
Sodium: 138 mEq/L (ref 135–145)
Total Bilirubin: 0.3 mg/dL (ref 0.3–1.2)
Total Protein: 7.6 g/dL (ref 6.0–8.3)

## 2012-11-29 LAB — IRON AND TIBC
%SAT: 12 % — ABNORMAL LOW (ref 20–55)
Iron: 40 ug/dL — ABNORMAL LOW (ref 42–145)
TIBC: 329 ug/dL (ref 250–470)
UIBC: 289 ug/dL (ref 125–400)

## 2012-11-29 LAB — LIPID PANEL
Cholesterol: 164 mg/dL (ref 0–200)
HDL: 45 mg/dL (ref >39)
LDL Cholesterol: 86 mg/dL (ref 0–99)
Total CHOL/HDL Ratio: 3.6 Ratio
Triglycerides: 167 mg/dL — ABNORMAL HIGH (ref <150)
VLDL: 33 mg/dL (ref 0–40)

## 2012-11-29 LAB — CK: Total CK: 189 U/L — ABNORMAL HIGH (ref 7–177)

## 2012-11-29 LAB — TSH: TSH: 1.198 u[IU]/mL (ref 0.350–4.500)

## 2012-11-29 LAB — MAGNESIUM: Magnesium: 1.5 mg/dL (ref 1.5–2.5)

## 2012-11-29 LAB — MICROALBUMIN, URINE: Microalb, Ur: 9.14 mg/dL — ABNORMAL HIGH (ref 0.00–1.89)

## 2013-01-25 ENCOUNTER — Other Ambulatory Visit: Payer: Self-pay | Admitting: Family Medicine

## 2013-04-16 ENCOUNTER — Other Ambulatory Visit (HOSPITAL_COMMUNITY): Payer: Self-pay | Admitting: Orthopedic Surgery

## 2013-04-16 DIAGNOSIS — M545 Low back pain, unspecified: Secondary | ICD-10-CM

## 2013-04-27 ENCOUNTER — Ambulatory Visit (HOSPITAL_COMMUNITY)
Admission: RE | Admit: 2013-04-27 | Discharge: 2013-04-27 | Disposition: A | Discharge status: Home / Self Care | Payer: 59 | Source: Ambulatory Visit | Attending: Orthopedic Surgery | Admitting: Orthopedic Surgery

## 2013-04-27 DIAGNOSIS — M545 Low back pain, unspecified: Secondary | ICD-10-CM

## 2013-06-01 ENCOUNTER — Ambulatory Visit (INDEPENDENT_AMBULATORY_CARE_PROVIDER_SITE_OTHER): Payer: 59 | Admitting: Internal Medicine

## 2013-06-01 VITALS — BP 150/88 | HR 108 | Temp 99.3°F | Resp 16 | Ht 59.25 in | Wt 225.6 lb

## 2013-06-01 DIAGNOSIS — R52 Pain, unspecified: Secondary | ICD-10-CM

## 2013-06-01 DIAGNOSIS — IMO0001 Reserved for inherently not codable concepts without codable children: Secondary | ICD-10-CM

## 2013-06-01 DIAGNOSIS — K219 Gastro-esophageal reflux disease without esophagitis: Secondary | ICD-10-CM

## 2013-06-01 DIAGNOSIS — E119 Type 2 diabetes mellitus without complications: Secondary | ICD-10-CM

## 2013-06-01 DIAGNOSIS — E7800 Pure hypercholesterolemia, unspecified: Secondary | ICD-10-CM

## 2013-06-01 DIAGNOSIS — I10 Essential (primary) hypertension: Secondary | ICD-10-CM

## 2013-06-01 DIAGNOSIS — R509 Fever, unspecified: Secondary | ICD-10-CM

## 2013-06-01 DIAGNOSIS — J111 Influenza due to unidentified influenza virus with other respiratory manifestations: Secondary | ICD-10-CM

## 2013-06-01 LAB — POCT INFLUENZA A/B
Influenza A, POC: POSITIVE
Influenza B, POC: NEGATIVE

## 2013-06-01 MED ORDER — AMLODIPINE BESYLATE 10 MG PO TABS: 10.0000 mg | ORAL_TABLET | Freq: Every day | ORAL | Status: DC

## 2013-06-01 MED ORDER — METFORMIN HCL 1000 MG PO TABS: 1000.0000 mg | ORAL_TABLET | Freq: Two times a day (BID) | ORAL | Status: DC

## 2013-06-01 MED ORDER — LANSOPRAZOLE 30 MG PO CPDR: 30.0000 mg | DELAYED_RELEASE_CAPSULE | Freq: Every day | ORAL | Status: DC

## 2013-06-01 MED ORDER — HYDROCODONE-HOMATROPINE 5-1.5 MG/5ML PO SYRP: 5.0000 mL | ORAL_SOLUTION | Freq: Four times a day (QID) | ORAL | Status: DC | PRN

## 2013-06-01 MED ORDER — METOPROLOL SUCCINATE ER 100 MG PO TB24: ORAL_TABLET | ORAL | Status: DC

## 2013-06-01 MED ORDER — PRAVASTATIN SODIUM 80 MG PO TABS: 80.0000 mg | ORAL_TABLET | Freq: Every day | ORAL | Status: DC

## 2013-06-01 MED ORDER — LOSARTAN POTASSIUM-HCTZ 100-25 MG PO TABS: 1.0000 | ORAL_TABLET | Freq: Every day | ORAL | Status: DC

## 2013-06-01 NOTE — Progress Notes (Addendum)
Subjective:    Patient ID: [name], female    DOB: March 31, 1956, 58 y.o.   MRN: [id number] This chart was scribed for Tami Lin, MD by Anastasia Pall, ED Scribe. This patient was seen in room 11 and the patient's care was started at 3:00 PM.  Chief Complaint  Patient presents with  . Generalized Body Aches    All X tuesday  . Sore Throat  . Chills  . Cough    HPI [name] is a 58 y.o. female who presents to the [facility] complaining of flu like symptoms including, cough, generalized body aches, fever with max temperature of 99.3, sore throat, rhinorrhea, and right ear pain, onset 4 days ago. She reports trouble sleeping due to the cough. She denies any other symptoms.   PCP -Johnsie Cancel hospital scheduling Patient Active Problem List   Diagnosis Date Noted  . DM type 2 (diabetes mellitus, type 2)   . HTN (hypertension)   . Hypercholesterolemia   . Reflux    out of meds early next week/needs refills but needs routine follow up of labs first  Review of Systems  Constitutional: Positive for fever.  HENT: Positive for ear pain (right), rhinorrhea and sore throat.   Respiratory: Positive for cough.   Musculoskeletal: Positive for myalgias (generalized).  Psychiatric/Behavioral: Positive for sleep disturbance.      Objective:   Physical Exam  Nursing note and vitals reviewed. Constitutional: She is oriented to person, place, and time. She appears well-developed and well-nourished. No distress.  HENT:  Head: Normocephalic and atraumatic.  Nose: Rhinorrhea present.  Mouth/Throat: Oropharynx is clear and moist.  Right TM with fluid. No appearance of infection.   Eyes: Conjunctivae and EOM are normal. Pupils are equal, round, and reactive to light.  Neck: Neck supple. No thyromegaly present.  Cardiovascular: Normal rate and regular rhythm.   Murmur heard. 1/6 systolic ejection murmur along the left sternal border with a heart rate of 100  Pulmonary/Chest:  Effort normal and breath sounds normal. No respiratory distress. She has no wheezes. She has no rales.  Musculoskeletal: Normal range of motion.  Lymphadenopathy:    She has no cervical adenopathy.  Neurological: She is alert and oriented to person, place, and time.    BP 150/88  Pulse 108  Temp(Src) 99.3 F (37.4 C) (Oral)  Resp 16  Ht 4' 11.25" (1.505 m)  Wt 225 lb 9.6 oz (102.331 kg)  BMI 45.18 kg/m2  SpO2 98% Results for orders placed in visit on 06/01/13  POCT INFLUENZA A/B      Result Value Range   Influenza A, POC Positive     Influenza B, POC Negative         Assessment & Plan:   1. Fever   2. Body aches    underlying causes influenza A-despite flu shot 3. Reflux  4. Hypercholesterolemia  5. HTN (hypertension) - Plan: losartan-hydrochlorothiazide (HYZAAR) 100-25 MG per tablet, amLODipine (NORVASC) 10 MG tablet  6. DM type 2 (diabetes mellitus, type 2)  7. GERD (gastroesophageal reflux disease) - Plan: lansoprazole (PREVACID) 30 MG capsule  Meds ordered this encounter  Medications  . HYDROcodone-homatropine (HYCODAN) 5-1.5 MG/5ML syrup    Sig: Take 5 mLs by mouth every 6 (six) hours as needed for cough.    Dispense:  120 mL    Refill:  0  . losartan-hydrochlorothiazide (HYZAAR) 100-25 MG per tablet    Sig: Take 1 tablet by mouth daily.    Dispense:  90 tablet    Refill:  0  . metoprolol succinate (TOPROL-XL) 100 MG 24 hr tablet    Sig: TAKE 1 TABLET (100 MG TOTAL) BY MOUTH DAILY. TAKE WITH OR IMMEDIATELY FOLLOWING A MEAL.    Dispense:  90 tablet    Refill:  0  . metFORMIN (GLUCOPHAGE) 1000 MG tablet    Sig: Take 1 tablet (1,000 mg total) by mouth 2 (two) times daily with a meal.    Dispense:  180 tablet    Refill:  0  . amLODipine (NORVASC) 10 MG tablet    Sig: Take 1 tablet (10 mg total) by mouth daily.    Dispense:  90 tablet    Refill:  0  . pravastatin (PRAVACHOL) 80 MG tablet    Sig: Take 1 tablet (80 mg total) by mouth at bedtime.     Dispense:  90 tablet    Refill:  0  . lansoprazole (PREVACID) 30 MG capsule    Sig: Take 1 capsule (30 mg total) by mouth daily.    Dispense:  90 capsule    Refill:  0   oow 3 d Appointment at 104 for followup plus Labs in 6 weeks   I have completed the patient encounter in its entirety as documented by the scribe, with editing by me where necessary. Jawaun Celmer P. Laney Pastor, M.D.

## 2013-06-04 NOTE — Progress Notes (Signed)
Left message to schedule with Dr Marin Comment to see her for Dr Brigitte Pulse.

## 2013-07-16 ENCOUNTER — Other Ambulatory Visit: Payer: Self-pay | Admitting: Family Medicine

## 2013-07-21 ENCOUNTER — Ambulatory Visit (INDEPENDENT_AMBULATORY_CARE_PROVIDER_SITE_OTHER): Payer: 59 | Admitting: Emergency Medicine

## 2013-07-21 VITALS — BP 143/83 | HR 94 | Temp 98.2°F | Resp 18 | Ht <= 58 in | Wt 222.0 lb

## 2013-07-21 DIAGNOSIS — E7800 Pure hypercholesterolemia, unspecified: Secondary | ICD-10-CM

## 2013-07-21 DIAGNOSIS — E119 Type 2 diabetes mellitus without complications: Secondary | ICD-10-CM

## 2013-07-21 DIAGNOSIS — I10 Essential (primary) hypertension: Secondary | ICD-10-CM

## 2013-07-21 DIAGNOSIS — Z0000 Encounter for general adult medical examination without abnormal findings: Secondary | ICD-10-CM

## 2013-07-21 DIAGNOSIS — D649 Anemia, unspecified: Secondary | ICD-10-CM

## 2013-07-21 DIAGNOSIS — E782 Mixed hyperlipidemia: Secondary | ICD-10-CM

## 2013-07-21 DIAGNOSIS — J45909 Unspecified asthma, uncomplicated: Secondary | ICD-10-CM

## 2013-07-21 LAB — COMPREHENSIVE METABOLIC PANEL
ALT: 14 U/L (ref 0–35)
AST: 14 U/L (ref 0–37)
Albumin: 5 g/dL (ref 3.5–5.2)
Alkaline Phosphatase: 65 U/L (ref 39–117)
BUN: 12 mg/dL (ref 6–23)
CO2: 29 mEq/L (ref 19–32)
Calcium: 10.6 mg/dL — ABNORMAL HIGH (ref 8.4–10.5)
Chloride: 100 mEq/L (ref 96–112)
Creat: 0.83 mg/dL (ref 0.50–1.10)
Glucose, Bld: 131 mg/dL — ABNORMAL HIGH (ref 70–99)
Potassium: 4.2 mEq/L (ref 3.5–5.3)
Sodium: 142 mEq/L (ref 135–145)
Total Bilirubin: 0.4 mg/dL (ref 0.2–1.2)
Total Protein: 8.1 g/dL (ref 6.0–8.3)

## 2013-07-21 LAB — POCT UA - MICROSCOPIC ONLY
Casts, Ur, LPF, POC: NEGATIVE
Crystals, Ur, HPF, POC: NEGATIVE
Mucus, UA: POSITIVE
Yeast, UA: NEGATIVE

## 2013-07-21 LAB — POCT CBC
Granulocyte percent: 59.5 %G (ref 37–80)
HCT, POC: 39.9 % (ref 37.7–47.9)
Hemoglobin: 12 g/dL — AB (ref 12.2–16.2)
Lymph, poc: 2.4 (ref 0.6–3.4)
MCH, POC: 23.5 pg — AB (ref 27–31.2)
MCHC: 30.1 g/dL — AB (ref 31.8–35.4)
MCV: 78.2 fL — AB (ref 80–97)
MID (cbc): 0.5 (ref 0–0.9)
MPV: 9.5 fL (ref 0–99.8)
POC Granulocyte: 4.3 (ref 2–6.9)
POC LYMPH PERCENT: 33.3 %L (ref 10–50)
POC MID %: 7.2 %M (ref 0–12)
Platelet Count, POC: 362 10*3/uL (ref 142–424)
RBC: 5.1 M/uL (ref 4.04–5.48)
RDW, POC: 19.6 %
WBC: 7.2 10*3/uL (ref 4.6–10.2)

## 2013-07-21 LAB — POCT URINALYSIS DIPSTICK
Bilirubin, UA: NEGATIVE
Blood, UA: NEGATIVE
Glucose, UA: NEGATIVE
Ketones, UA: NEGATIVE
Nitrite, UA: NEGATIVE
Protein, UA: 30
Spec Grav, UA: 1.02
Urobilinogen, UA: 0.2
pH, UA: 5.5

## 2013-07-21 LAB — LIPID PANEL
Cholesterol: 146 mg/dL (ref 0–200)
HDL: 40 mg/dL (ref >39)
LDL Cholesterol: 81 mg/dL (ref 0–99)
Total CHOL/HDL Ratio: 3.7 Ratio
Triglycerides: 126 mg/dL (ref <150)
VLDL: 25 mg/dL (ref 0–40)

## 2013-07-21 LAB — MICROALBUMIN, URINE: Microalb, Ur: 13.97 mg/dL — ABNORMAL HIGH (ref 0.00–1.89)

## 2013-07-21 MED ORDER — CELEBREX 200 MG PO CAPS: 200.0000 mg | ORAL_CAPSULE | Freq: Every day | ORAL | Status: DC

## 2013-07-21 MED ORDER — LANSOPRAZOLE 30 MG PO CPDR: DELAYED_RELEASE_CAPSULE | ORAL | Status: DC

## 2013-07-21 MED ORDER — AMLODIPINE BESYLATE 10 MG PO TABS: 10.0000 mg | ORAL_TABLET | Freq: Every day | ORAL | Status: DC

## 2013-07-21 MED ORDER — LOSARTAN POTASSIUM-HCTZ 100-25 MG PO TABS: 1.0000 | ORAL_TABLET | Freq: Every day | ORAL | Status: DC

## 2013-07-21 MED ORDER — ATORVASTATIN CALCIUM 40 MG PO TABS: 40.0000 mg | ORAL_TABLET | Freq: Every day | ORAL | Status: DC

## 2013-07-21 MED ORDER — ALBUTEROL SULFATE HFA 108 (90 BASE) MCG/ACT IN AERS: 2.0000 | INHALATION_SPRAY | Freq: Four times a day (QID) | RESPIRATORY_TRACT | Status: AC | PRN

## 2013-07-21 MED ORDER — METOPROLOL SUCCINATE ER 100 MG PO TB24: ORAL_TABLET | ORAL | Status: DC

## 2013-07-21 MED ORDER — METFORMIN HCL 1000 MG PO TABS: 1000.0000 mg | ORAL_TABLET | Freq: Two times a day (BID) | ORAL | Status: DC

## 2013-07-21 MED ORDER — POLYSACCHARIDE IRON COMPLEX 150 MG PO CAPS: 150.0000 mg | ORAL_CAPSULE | Freq: Two times a day (BID) | ORAL | Status: DC

## 2013-07-21 NOTE — Patient Instructions (Signed)
Calorie Counting Diet A calorie counting diet requires you to eat the number of calories that are right for you in a day. Calories are the measurement of how much energy you get from the food you eat. Eating the right amount of calories is important for staying at a healthy weight. If you eat too many calories, your body will store them as fat and you may gain weight. If you eat too few calories, you may lose weight. Counting the number of calories you eat during a day will help you know if you are eating the right amount. A Registered Dietitian can determine how many calories you need in a day. The amount of calories needed varies from person to person. If your goal is to lose weight, you will need to eat fewer calories. Losing weight can benefit you if you are overweight or have health problems such as heart disease, high blood pressure, or diabetes. If your goal is to gain weight, you will need to eat more calories. Gaining weight may be necessary if you have a certain health problem that causes your body to need more energy. TIPS Whether you are increasing or decreasing the number of calories you eat during a day, it may be hard to get used to changes in what you eat and drink. The following are tips to help you keep track of the number of calories you eat.  Measure foods at home with measuring cups. This helps you know the amount of food and number of calories you are eating.  Restaurants often serve food in amounts that are larger than 1 serving. While eating out, estimate how many servings of a food you are given. For example, a serving of cooked rice is  cup or about the size of half of a fist. Knowing serving sizes will help you be aware of how much food you are eating at restaurants.  Ask for smaller portion sizes or child-size portions at restaurants.  Plan to eat half of a meal at a restaurant. Take the rest home or share the other half with a friend.  Read the Nutrition Facts panel on  food labels for calorie content and serving size. You can find out how many servings are in a package, the size of a serving, and the number of calories each serving has.  For example, a package might contain 3 cookies. The Nutrition Facts panel on that package says that 1 serving is 1 cookie. Below that, it will say there are 3 servings in the container. The calories section of the Nutrition Facts label says there are 90 calories. This means there are 90 calories in 1 cookie (1 serving). If you eat 1 cookie you have eaten 90 calories. If you eat all 3 cookies, you have eaten 270 calories (3 servings x 90 calories = 270 calories). The list below tells you how big or small some common portion sizes are.  1 oz.........4 stacked dice.  3 oz.........Deck of cards.  1 tsp........Tip of little finger.  1 tbs........Thumb.  2 tbs........Golf ball.   cup.......Half of a fist.  1 cup........A fist. KEEP A FOOD LOG Write down every food item you eat, the amount you eat, and the number of calories in each food you eat during the day. At the end of the day, you can add up the total number of calories you have eaten. It may help to keep a list like the one below. Find out the calorie information by reading the   Nutrition Facts panel on food labels. Breakfast  Bran cereal (1 cup, 110 calories).  Fat-free milk ( cup, 45 calories). Snack  Apple (1 medium, 80 calories). Lunch  Spinach (1 cup, 20 calories).  Tomato ( medium, 20 calories).  Chicken breast strips (3 oz, 165 calories).  Shredded cheddar cheese ( cup, 110 calories).  Light Italian dressing (2 tbs, 60 calories).  Whole-wheat bread (1 slice, 80 calories).  Tub margarine (1 tsp, 35 calories).  Vegetable soup (1 cup, 160 calories). Dinner  Pork chop (3 oz, 190 calories).  Brown rice (1 cup, 215 calories).  Steamed broccoli ( cup, 20 calories).  Strawberries (1  cup, 65 calories).  Whipped cream (1 tbs, 50  calories). Daily Calorie Total: 1425 Document Released: 05/10/2005 Document Revised: 08/02/2011 Document Reviewed: 11/04/2006 [employer] Patient Information 2014 [employer].  

## 2013-07-21 NOTE — Progress Notes (Signed)
[facility] [address] [place] [address] [phone]  Date:  07/21/2013   Name:  [name]   DOB:  Jan 07, 1956   MRN:  [id number]  PCP:  No primary provider on file.    Chief Complaint: Annual Exam   History of Present Illness:  [name] is a 58 y.o. very pleasant female patient who presents with the following:  For refill on her medications.  Last labs were in July.  Fasting.  No improvement with over the counter medications or other home remedies. Denies other complaint or health concern today.   Patient Active Problem List   Diagnosis Date Noted  . DM type 2 (diabetes mellitus, type 2)   . HTN (hypertension)   . Hypercholesterolemia   . Reflux     Past Medical History  Diagnosis Date  . DM type 2 (diabetes mellitus, type 2)   . HTN (hypertension)   . Hypercholesterolemia   . Reflux   . Anemia   . Arthritis   . Heart murmur     Past Surgical History  Procedure Laterality Date  . Cesarean section      x3  . Tubal ligation      History  Substance Use Topics  . Smoking status: Never Smoker   . Smokeless tobacco: Not on file  . Alcohol Use: No    Family History  Problem Relation Age of Onset  . Hypertension Mother   . Stroke Mother   . Heart disease Father   . Stroke Brother   . Multiple sclerosis Brother   . Multiple sclerosis Sister     Allergies  Allergen Reactions  . Sulfa Antibiotics Hives    Medication list has been reviewed and updated.  Current Outpatient Prescriptions on File Prior to Visit  Medication Sig Dispense Refill  . albuterol (PROAIR HFA) 108 (90 BASE) MCG/ACT inhaler Inhale 2 puffs into the lungs every 6 (six) hours as needed.  1 Inhaler  6  . amLODipine (NORVASC) 10 MG tablet Take 1 tablet (10 mg total) by mouth daily.  90 tablet  0  . CELEBREX 200 MG capsule Take 1 capsule (200 mg total) by mouth daily. PATIENT NEEDS OFFICE VISIT FOR ADDITIONAL REFILLS  30 capsule  0  . iron  polysaccharides (NIFEREX) 150 MG capsule Take 1 capsule (150 mg total) by mouth 2 (two) times daily.  180 capsule  1  . lansoprazole (PREVACID) 30 MG capsule TAKE 1 CAPSULE (30 MG TOTAL) BY MOUTH DAILY.  90 capsule  0  . losartan-hydrochlorothiazide (HYZAAR) 100-25 MG per tablet Take 1 tablet by mouth daily.  90 tablet  0  . metFORMIN (GLUCOPHAGE) 1000 MG tablet Take 1 tablet (1,000 mg total) by mouth 2 (two) times daily with a meal.  180 tablet  0  . metoprolol succinate (TOPROL-XL) 100 MG 24 hr tablet TAKE 1 TABLET (100 MG TOTAL) BY MOUTH DAILY. TAKE WITH OR IMMEDIATELY FOLLOWING A MEAL.  90 tablet  0  . pravastatin (PRAVACHOL) 80 MG tablet Take 1 tablet (80 mg total) by mouth at bedtime.  90 tablet  0   No current facility-administered medications on file prior to visit.    Review of Systems:  As per HPI, otherwise negative.    Physical Examination: Filed Vitals:   07/21/13 0940  BP: 143/83  Pulse: 94  Temp: 98.2 F (36.8 C)  Resp: 18   Filed Vitals:   07/21/13 0940  Height: 4' 9.5" (  1.461 m)  Weight: 222 lb (100.699 kg)   Body mass index is 47.18 kg/(m^2). Ideal Body Weight: Weight in (lb) to have BMI = 25: 117.3  GEN: obese, NAD, Non-toxic, A & O x 3 HEENT: Atraumatic, Normocephalic. Neck supple. No masses, No LAD. Ears and Nose: No external deformity. CV: RRR, No M/G/R. No JVD. No thrill. No extra heart sounds. PULM: CTA B, no wheezes, crackles, rhonchi. No retractions. No resp. distress. No accessory muscle use. ABD: S, NT, ND, +BS. No rebound. No HSM. EXTR: No c/c/e NEURO Normal gait.  PSYCH: Normally interactive. Conversant. Not depressed or anxious appearing.  Calm demeanor.    Assessment and Plan: Overweight NIDDM  HBP Hyperlipidemia Weight loss Labs FU 6 months  Signed,  Ellison Carwin, MD

## 2013-07-21 NOTE — Progress Notes (Signed)
  Tuberculosis Risk Questionnaire  1. No Were you born outside the [place] in one of the following parts of the world: [place], [place], [place], [place] or [place]?    2. No Have you traveled outside the [place] and lived for more than one month in one of the following parts of the world: [place], [place], [place], [place] or [place]?    3. Yes Diabetes Do you have a compromised immune system such as from any of the following conditions:HIV/AIDS, organ or bone marrow transplantation, diabetes, immunosuppressive medicines (e.g. Prednisone, Remicaide), leukemia, lymphoma, cancer of the head or neck, gastrectomy or jejunal bypass, end-stage renal disease (on dialysis), or silicosis?     4. Yes Hospital Have you ever or do you plan on working in: a residential care center, a health care facility, a jail or prison or homeless shelter?    5. No Have you ever: injected illegal drugs, used crack cocaine, lived in a homeless shelter  or been in jail or prison?     6. No Have you ever been exposed to anyone with infectious tuberculosis?    Tuberculosis Symptom Questionnaire  Do you currently have any of the following symptoms?  1. No Unexplained cough lasting more than 3 weeks?   2. No Unexplained fever lasting more than 3 weeks.   3. No Night Sweats (sweating that leaves the bedclothes and sheets wet)     4. No Shortness of Breath   5. No Chest Pain   6. No Unintentional weight loss    7. No Unexplained fatigue (very tired for no reason)

## 2013-07-22 NOTE — Addendum Note (Signed)
Addended by: Roselee Culver on: 07/22/2013 08:36 AM   Modules accepted: Orders

## 2013-08-06 ENCOUNTER — Other Ambulatory Visit: Payer: Self-pay | Admitting: Emergency Medicine

## 2013-08-06 DIAGNOSIS — Z1231 Encounter for screening mammogram for malignant neoplasm of breast: Secondary | ICD-10-CM

## 2013-08-21 ENCOUNTER — Ambulatory Visit
Admission: RE | Admit: 2013-08-21 | Discharge: 2013-08-21 | Disposition: A | Discharge status: Home / Self Care | Payer: 59 | Source: Ambulatory Visit | Attending: Emergency Medicine | Admitting: Emergency Medicine

## 2013-08-21 DIAGNOSIS — Z1231 Encounter for screening mammogram for malignant neoplasm of breast: Secondary | ICD-10-CM

## 2013-08-23 ENCOUNTER — Other Ambulatory Visit: Payer: Self-pay | Admitting: Internal Medicine

## 2013-08-27 ENCOUNTER — Other Ambulatory Visit: Payer: Self-pay | Admitting: Family Medicine

## 2013-08-29 ENCOUNTER — Other Ambulatory Visit: Payer: Self-pay

## 2013-08-29 MED ORDER — PRAVASTATIN SODIUM 80 MG PO TABS: 80.0000 mg | ORAL_TABLET | Freq: Every day | ORAL | Status: DC

## 2013-08-29 NOTE — Telephone Encounter (Signed)
Pharm faxed req for RF of pravastatin 80 mg. Dr Ouida Sills had written Rx for Lipitor at 07/21/13 OV but didn't see why it was changed, lipid panel results were good. Called pt to verify which med she is supposed to be taking and she stated that she never p/up the Lipitor bc she remembered after she got home that she had taken it several years ago and it had caused her joint pain. Pt has not had any problem taking the pravastatin and prefers to stay on that. Dr Ouida Sills, I have pended, but wanted to check w/you before sending RFs.

## 2013-11-12 ENCOUNTER — Other Ambulatory Visit: Payer: Self-pay | Admitting: Nephrology

## 2013-11-12 DIAGNOSIS — R809 Proteinuria, unspecified: Secondary | ICD-10-CM

## 2013-11-15 ENCOUNTER — Other Ambulatory Visit: Payer: Self-pay | Admitting: Internal Medicine

## 2013-11-20 ENCOUNTER — Other Ambulatory Visit (HOSPITAL_COMMUNITY): Payer: Self-pay | Admitting: Orthopedic Surgery

## 2013-11-22 ENCOUNTER — Other Ambulatory Visit: Payer: Self-pay

## 2013-11-26 ENCOUNTER — Other Ambulatory Visit: Payer: Self-pay | Admitting: Emergency Medicine

## 2013-12-05 ENCOUNTER — Ambulatory Visit
Admission: RE | Admit: 2013-12-05 | Discharge: 2013-12-05 | Disposition: A | Discharge status: Home / Self Care | Payer: 59 | Source: Ambulatory Visit | Attending: Nephrology | Admitting: Nephrology

## 2013-12-05 DIAGNOSIS — R809 Proteinuria, unspecified: Secondary | ICD-10-CM

## 2013-12-14 ENCOUNTER — Other Ambulatory Visit (HOSPITAL_COMMUNITY): Payer: Self-pay | Admitting: Orthopedic Surgery

## 2013-12-14 DIAGNOSIS — M4310 Spondylolisthesis, site unspecified: Secondary | ICD-10-CM

## 2013-12-17 ENCOUNTER — Ambulatory Visit (INDEPENDENT_AMBULATORY_CARE_PROVIDER_SITE_OTHER): Payer: 59 | Admitting: Family Medicine

## 2013-12-17 VITALS — BP 146/88 | HR 79 | Temp 98.7°F | Resp 18 | Ht 64.5 in | Wt 206.2 lb

## 2013-12-17 DIAGNOSIS — D509 Iron deficiency anemia, unspecified: Secondary | ICD-10-CM

## 2013-12-17 DIAGNOSIS — E119 Type 2 diabetes mellitus without complications: Secondary | ICD-10-CM

## 2013-12-17 DIAGNOSIS — I10 Essential (primary) hypertension: Secondary | ICD-10-CM

## 2013-12-17 LAB — POCT GLYCOSYLATED HEMOGLOBIN (HGB A1C): Hemoglobin A1C: 6.2

## 2013-12-17 MED ORDER — CELEBREX 200 MG PO CAPS: ORAL_CAPSULE | ORAL | Status: DC

## 2013-12-17 MED ORDER — LOSARTAN POTASSIUM-HCTZ 100-25 MG PO TABS: ORAL_TABLET | ORAL | Status: DC

## 2013-12-17 MED ORDER — LANSOPRAZOLE 30 MG PO CPDR: DELAYED_RELEASE_CAPSULE | ORAL | Status: DC

## 2013-12-17 MED ORDER — AMLODIPINE BESYLATE 10 MG PO TABS: 10.0000 mg | ORAL_TABLET | Freq: Every day | ORAL | Status: DC

## 2013-12-17 MED ORDER — PRAVASTATIN SODIUM 80 MG PO TABS: 80.0000 mg | ORAL_TABLET | Freq: Every day | ORAL | Status: DC

## 2013-12-17 MED ORDER — POLYSACCHARIDE IRON COMPLEX 150 MG PO CAPS: 150.0000 mg | ORAL_CAPSULE | Freq: Two times a day (BID) | ORAL | Status: DC

## 2013-12-17 MED ORDER — METOPROLOL SUCCINATE ER 100 MG PO TB24: ORAL_TABLET | ORAL | Status: DC

## 2013-12-17 MED ORDER — METFORMIN HCL 1000 MG PO TABS: 1000.0000 mg | ORAL_TABLET | Freq: Two times a day (BID) | ORAL | Status: DC

## 2013-12-17 NOTE — Patient Instructions (Signed)
Keep up the good work with your diet!  Aim for 1 pound weight loss a week- you can do it!!

## 2013-12-17 NOTE — Progress Notes (Signed)
   Subjective:    Patient ID: [name], female    DOB: [birth year], 58 y.o.   MRN: [id number]  HPI This is a very pleasant 58 yo patient who is accompanied by her husband, who is also being seen as a patient this evening. The patient started having sharp pain in upper her left side for about 10 minutes last night. Didn't have any pain through the night. She had one or two sharp pains today while sitting at her computer. These resolved within a couple of seconds. No unusual activity prior to episodes. No SOB, no diaphoresis.  Doesn't check blood sugar at home. Has been exercising more, watching her diet and eliminating carbs. Has lost 16 pounds since 07/21/13.  Saw nephrology for elevated microalbuminuria. Was instructed to only use Celebrex up to 4x a week.   Review of Systems No cough, no fever, no polyuria/polyphagia/polydipsia     Objective:   Physical Exam  Vitals reviewed. Constitutional: She is oriented to person, place, and time. She appears well-developed and well-nourished.  HENT:  Head: Normocephalic and atraumatic.  Eyes: Conjunctivae are normal.  Neck: Normal range of motion. Neck supple.  Cardiovascular: Normal rate, regular rhythm, normal heart sounds and intact distal pulses.   Pulmonary/Chest: Effort normal and breath sounds normal.    Musculoskeletal: Normal range of motion.  Neurological: She is alert and oriented to person, place, and time.  Skin: Skin is warm and dry.  Psychiatric: She has a normal mood and affect. Her behavior is normal. Judgment and thought content normal.   Results for orders placed in visit on 12/17/13  POCT GLYCOSYLATED HEMOGLOBIN (HGB A1C)      Result Value Ref Range   Hemoglobin A1C 6.2     This is improved from 6.8 5/14.    Assessment & Plan:  1. Type 2 diabetes mellitus without complication - POCT glycosylated hemoglobin (Hb A1C) - metFORMIN (GLUCOPHAGE) 1000 MG tablet; Take 1 tablet (1,000 mg total) by mouth 2 (two) times  daily with a meal.  Dispense: 180 tablet; Refill: 1 - pravastatin (PRAVACHOL) 80 MG tablet; Take 1 tablet (80 mg total) by mouth daily.  Dispense: 90 tablet; Refill: 1  2. Essential hypertension - amLODipine (NORVASC) 10 MG tablet; Take 1 tablet (10 mg total) by mouth daily.  Dispense: 90 tablet; Refill: 1 - losartan-hydrochlorothiazide (HYZAAR) 100-25 MG per tablet; TAKE 1 TABLET BY MOUTH DAILY.  Dispense: 30 tablet; Refill: 1 - metoprolol succinate (TOPROL-XL) 100 MG 24 hr tablet; TAKE 1 TABLET (100 MG TOTAL) BY MOUTH DAILY. TAKE WITH OR IMMEDIATELY FOLLOWING A MEAL.  Dispense: 90 tablet; Refill: 1  3. Iron deficiency anemia -continue iron supplementation, will recheck at next visit - iron polysaccharides (NIFEREX) 150 MG capsule; Take 1 capsule (150 mg total) by mouth 2 (two) times daily.  Dispense: 180 capsule; Refill: 1  -Discussed patient having regular provider and scheduled appointments at 104. Patient reports that it works better for her schedule to be seen at [facility].  -Patient instructed to return in 3-4 months for follow up of DM and HTN, encouraged continued weight loss efforts.  Elby Beck, FNP-BC  [facility]  12/18/2013 8:49 PM

## 2013-12-18 ENCOUNTER — Telehealth: Payer: Self-pay

## 2013-12-18 NOTE — Telephone Encounter (Signed)
[facility] WOULD LIKE TO KNOW IF THEY COULD CHANGE THE DAW 1 TO DAW 0 ON PT'S CELEBREX PLEASE CALL [phone]

## 2013-12-18 NOTE — Telephone Encounter (Signed)
Spoke to pharm, ok to use generic

## 2013-12-24 ENCOUNTER — Ambulatory Visit (HOSPITAL_COMMUNITY)
Admission: RE | Admit: 2013-12-24 | Discharge: 2013-12-24 | Disposition: A | Discharge status: Home / Self Care | Payer: PRIVATE HEALTH INSURANCE | Source: Ambulatory Visit | Attending: Orthopedic Surgery | Admitting: Orthopedic Surgery

## 2013-12-24 DIAGNOSIS — M47817 Spondylosis without myelopathy or radiculopathy, lumbosacral region: Secondary | ICD-10-CM | POA: Insufficient documentation

## 2013-12-24 DIAGNOSIS — M5126 Other intervertebral disc displacement, lumbar region: Secondary | ICD-10-CM | POA: Insufficient documentation

## 2013-12-24 DIAGNOSIS — M545 Low back pain, unspecified: Secondary | ICD-10-CM | POA: Diagnosis not present

## 2013-12-24 DIAGNOSIS — M48061 Spinal stenosis, lumbar region without neurogenic claudication: Secondary | ICD-10-CM | POA: Insufficient documentation

## 2013-12-24 DIAGNOSIS — M79609 Pain in unspecified limb: Secondary | ICD-10-CM | POA: Insufficient documentation

## 2013-12-24 DIAGNOSIS — R209 Unspecified disturbances of skin sensation: Secondary | ICD-10-CM | POA: Diagnosis not present

## 2013-12-24 DIAGNOSIS — M4310 Spondylolisthesis, site unspecified: Secondary | ICD-10-CM

## 2014-02-07 ENCOUNTER — Other Ambulatory Visit: Payer: Self-pay | Admitting: *Deleted

## 2014-02-07 DIAGNOSIS — I10 Essential (primary) hypertension: Secondary | ICD-10-CM

## 2014-02-07 MED ORDER — LOSARTAN POTASSIUM-HCTZ 100-25 MG PO TABS: ORAL_TABLET | ORAL | Status: DC

## 2014-02-16 ENCOUNTER — Telehealth: Payer: Self-pay

## 2014-02-16 NOTE — Telephone Encounter (Signed)
Clld pt - LMOVM of home phone advising of Diabetic Care follow up in October. Pt was advsd that she could call to schedule an appt with Tor Netters, who she saw in July or come to Ur[facility].

## 2014-03-21 ENCOUNTER — Other Ambulatory Visit: Payer: Self-pay

## 2014-03-21 MED ORDER — CELECOXIB 200 MG PO CAPS: ORAL_CAPSULE | ORAL | Status: DC

## 2014-04-07 ENCOUNTER — Ambulatory Visit (INDEPENDENT_AMBULATORY_CARE_PROVIDER_SITE_OTHER): Payer: 59 | Admitting: Internal Medicine

## 2014-04-07 VITALS — BP 131/76 | HR 81 | Temp 98.3°F | Resp 16 | Ht <= 58 in | Wt 212.2 lb

## 2014-04-07 DIAGNOSIS — E119 Type 2 diabetes mellitus without complications: Secondary | ICD-10-CM

## 2014-04-07 DIAGNOSIS — K219 Gastro-esophageal reflux disease without esophagitis: Secondary | ICD-10-CM

## 2014-04-07 DIAGNOSIS — J4520 Mild intermittent asthma, uncomplicated: Secondary | ICD-10-CM

## 2014-04-07 DIAGNOSIS — I10 Essential (primary) hypertension: Secondary | ICD-10-CM

## 2014-04-07 DIAGNOSIS — E7800 Pure hypercholesterolemia, unspecified: Secondary | ICD-10-CM

## 2014-04-07 DIAGNOSIS — Z6841 Body Mass Index (BMI) 40.0 and over, adult: Secondary | ICD-10-CM

## 2014-04-07 DIAGNOSIS — M1612 Unilateral primary osteoarthritis, left hip: Secondary | ICD-10-CM

## 2014-04-07 DIAGNOSIS — D509 Iron deficiency anemia, unspecified: Secondary | ICD-10-CM

## 2014-04-07 HISTORY — DX: Unilateral primary osteoarthritis, left hip: M16.12

## 2014-04-07 HISTORY — DX: Body Mass Index (BMI) 40.0 and over, adult: Z684

## 2014-04-07 LAB — COMPREHENSIVE METABOLIC PANEL
ALT: 14 U/L (ref 0–35)
AST: 13 U/L (ref 0–37)
Albumin: 4.7 g/dL (ref 3.5–5.2)
Alkaline Phosphatase: 63 U/L (ref 39–117)
BUN: 15 mg/dL (ref 6–23)
CO2: 31 mEq/L (ref 19–32)
Calcium: 10 mg/dL (ref 8.4–10.5)
Chloride: 101 mEq/L (ref 96–112)
Creat: 0.78 mg/dL (ref 0.50–1.10)
Glucose, Bld: 97 mg/dL (ref 70–99)
Potassium: 3.8 mEq/L (ref 3.5–5.3)
Sodium: 141 mEq/L (ref 135–145)
Total Bilirubin: 0.3 mg/dL (ref 0.2–1.2)
Total Protein: 7.7 g/dL (ref 6.0–8.3)

## 2014-04-07 LAB — CBC WITH DIFFERENTIAL/PLATELET
Basophils Absolute: 0 10*3/uL (ref 0.0–0.1)
Basophils Relative: 0 % (ref 0–1)
Eosinophils Absolute: 0.1 10*3/uL (ref 0.0–0.7)
Eosinophils Relative: 2 % (ref 0–5)
HCT: 37.5 % (ref 36.0–46.0)
Hemoglobin: 12 g/dL (ref 12.0–15.0)
Lymphocytes Relative: 38 % (ref 12–46)
Lymphs Abs: 2.4 10*3/uL (ref 0.7–4.0)
MCH: 23.3 pg — ABNORMAL LOW (ref 26.0–34.0)
MCHC: 32 g/dL (ref 30.0–36.0)
MCV: 72.8 fL — ABNORMAL LOW (ref 78.0–100.0)
Monocytes Absolute: 0.6 10*3/uL (ref 0.1–1.0)
Monocytes Relative: 10 % (ref 3–12)
Neutro Abs: 3.1 10*3/uL (ref 1.7–7.7)
Neutrophils Relative %: 50 % (ref 43–77)
Platelets: 329 10*3/uL (ref 150–400)
RBC: 5.15 MIL/uL — ABNORMAL HIGH (ref 3.87–5.11)
RDW: 18.2 % — ABNORMAL HIGH (ref 11.5–15.5)
WBC: 6.2 10*3/uL (ref 4.0–10.5)

## 2014-04-07 LAB — LIPID PANEL
Cholesterol: 165 mg/dL (ref 0–200)
HDL: 53 mg/dL (ref >39)
LDL Cholesterol: 93 mg/dL (ref 0–99)
Total CHOL/HDL Ratio: 3.1 Ratio
Triglycerides: 94 mg/dL (ref <150)
VLDL: 19 mg/dL (ref 0–40)

## 2014-04-07 LAB — POCT GLYCOSYLATED HEMOGLOBIN (HGB A1C): Hemoglobin A1C: 6.9

## 2014-04-07 MED ORDER — LOSARTAN POTASSIUM-HCTZ 100-25 MG PO TABS: ORAL_TABLET | ORAL | Status: DC

## 2014-04-07 MED ORDER — METFORMIN HCL 1000 MG PO TABS: 1000.0000 mg | ORAL_TABLET | Freq: Two times a day (BID) | ORAL | Status: DC

## 2014-04-07 MED ORDER — LANSOPRAZOLE 30 MG PO CPDR: DELAYED_RELEASE_CAPSULE | ORAL | Status: DC

## 2014-04-07 MED ORDER — CELECOXIB 200 MG PO CAPS: ORAL_CAPSULE | ORAL | Status: DC

## 2014-04-07 MED ORDER — PRAVASTATIN SODIUM 80 MG PO TABS: 80.0000 mg | ORAL_TABLET | Freq: Every day | ORAL | Status: DC

## 2014-04-07 MED ORDER — METOPROLOL SUCCINATE ER 100 MG PO TB24: ORAL_TABLET | ORAL | Status: DC

## 2014-04-07 MED ORDER — AMLODIPINE BESYLATE 10 MG PO TABS: 10.0000 mg | ORAL_TABLET | Freq: Every day | ORAL | Status: DC

## 2014-04-07 NOTE — Patient Instructions (Signed)
Wt Readings from Last 3 Encounters:  04/07/14 212 lb 3.2 oz (96.253 kg)  12/17/13 206 lb 3.2 oz (93.532 kg)  07/21/13 222 lb (100.699 kg)

## 2014-04-07 NOTE — Progress Notes (Signed)
Subjective:    Patient ID: [name], female    DOB: 1955/12/13, 58 y.o.   MRN: [id number] This chart was scribed for Tami Lin, MD by Marti Sleigh, Medical Scribe. This patient was seen in Room 5 and the patient's care was started a 12:29 PM.   HPI HPI Comments: [name] is a 58 y.o. female who presents to [facility] needing a medication refill. Pt states she recently visited a nephrologist who told her not to take anymore calcium due to hypercalcemia, and she is concerned whether she should take her losartan since it has calcium in it. Pt's last calcium level was 10.6   Pt denies numbness of tingling in feet.  Pt states that her GERD symptoms are well managed with her prevacid medication.  Pt denies numbness or tingling in extremities.  Patient Active Problem List   Diagnosis Date Noted  . DM type 2 (diabetes mellitus, type 2)----has not been able to lose weight/not checking blood sugars/no particular symptoms   . HTN (hypertension)---continues with medication. No chest pain or palpitations. No edema   . Hypercholesterolemia-asymptomatic on medication   . Reflux--no symptoms his lungs continues medication    Prior to Admission medications   Medication Sig Start Date End Date Taking? Authorizing Provider  albuterol (PROAIR HFA) 108 (90 BASE) MCG/ACT inhaler Inhale 2 puffs into the lungs every 6 (six) hours as needed. 07/21/13 Has not required use of this medication in over 6 months Yes Roselee Culver, MD  amLODipine (NORVASC) 10 MG tablet Take 1 tablet (10 mg total) by mouth daily. 04/07/14  Yes   celecoxib (CELEBREX) 200 MG capsule Take one capsule by mouth daily up to 4 times a week as needed for pain.   Yes This medication needed daily or she has severe pain in the hip with ambulation--see past history  iron polysaccharides (NIFEREX) 150 MG capsule Take 1 capsule (150 mg total) by mouth 2 (two) times daily. 12/17/13  Yes Elby Beck, FNP  lansoprazole  (PREVACID) 30 MG capsule TAKE 1 CAPSULE BY MOUTH DAILY. 04/07/14  Yes   losartan-hydrochlorothiazide (HYZAAR) 100-25 MG per tablet TAKE 1 TABLET BY MOUTH DAILY. 04/07/14     metFORMIN (GLUCOPHAGE) 1000 MG tablet Take 1 tablet (1,000 mg total) by mouth 2 (two) times daily with a meal. 04/07/14  Yes   metoprolol succinate (TOPROL-XL) 100 MG 24 hr tablet TAKE 1 TABLET (100 MG TOTAL) BY MOUTH DAILY. TAKE WITH OR IMMEDIATELY FOLLOWING A MEAL. 04/07/14  Yes   pravastatin (PRAVACHOL) 80 MG tablet Take 1 tablet (80 mg total) by mouth daily. 04/07/14        Review of Systems  Constitutional: Negative for fever, chills, activity change and unexpected weight change.  Eyes: Negative for visual disturbance.  Respiratory: Negative for shortness of breath.   Cardiovascular: Negative for chest pain, palpitations and leg swelling.  Gastrointestinal: Negative for abdominal pain.  Endocrine: Negative for polydipsia, polyphagia and polyuria.  Genitourinary: Negative for difficulty urinating.  Musculoskeletal:       It has been decided that her hip pain secondary to osteoarthritis and that she is not in need of hip replacement currently his lungs medications are effective  Skin: Negative for wound.  Neurological: Negative for dizziness, numbness and headaches.  Hematological: Negative for adenopathy. Does not bruise/bleed easily.  Psychiatric/Behavioral: Negative for sleep disturbance and decreased concentration.       Objective:   Physical Exam  Constitutional: She is oriented to person, place, and  time. She appears well-developed and well-nourished.  overweight  HENT:  Head: Normocephalic and atraumatic.  Eyes: Conjunctivae and EOM are normal. Pupils are equal, round, and reactive to light.  Neck: Normal range of motion. Neck supple. No thyromegaly present.  No carotid bruits  Cardiovascular: Normal rate, regular rhythm and normal heart sounds.   No murmur heard. Pulmonary/Chest: Effort normal and  breath sounds normal. No respiratory distress.  Musculoskeletal: She exhibits no edema.  No peripheral edema. Good peripheral pulses.sensation intact in lower extremities  Lymphadenopathy:    She has no cervical adenopathy.  Neurological: She is alert and oriented to person, place, and time. She has normal reflexes. No cranial nerve deficit.  Skin: Skin is warm and dry.  Psychiatric: She has a normal mood and affect. Her behavior is normal. Thought content normal.  Nursing note and vitals reviewed. BP 131/76 mmHg  Pulse 81  Temp(Src) 98.3 F (36.8 C) (Oral)  Resp 16  Ht 4\' 10"  (1.473 m)  Wt 212 lb 3.2 oz (96.253 kg)  BMI 44.36 kg/m2  SpO2 97%  Hemoglobin A1c below 7    Assessment & Plan:   I have completed the patient encounter in its entirety as documented by the scribe, with editing by me where necessary. Robert P. Laney Pastor, M.D.   Essential hypertension -no change in medication  Type 2 diabetes mellitus without complication -no change in medications  Iron deficiency anemia--recheck labs  Hypercholesteremia - Plan: Lipid panel  Gastroesophageal reflux disease without esophagitis--continue medication  Asthma, mild intermittent, uncomplicated-----currently inactive  Serum calcium elevated---to be repeated  BMI 44---weight loss mandatory   Meds ordered this encounter  Medications  . losartan-hydrochlorothiazide (HYZAAR) 100-25 MG per tablet    Sig: TAKE 1 TABLET BY MOUTH DAILY.    Dispense:  90 tablet    Refill:  1  . metoprolol succinate (TOPROL-XL) 100 MG 24 hr tablet    Sig: TAKE 1 TABLET (100 MG TOTAL) BY MOUTH DAILY. TAKE WITH OR IMMEDIATELY FOLLOWING A MEAL.    Dispense:  90 tablet    Refill:  1  . metFORMIN (GLUCOPHAGE) 1000 MG tablet    Sig: Take 1 tablet (1,000 mg total) by mouth 2 (two) times daily with a meal.    Dispense:  180 tablet    Refill:  1  . amLODipine (NORVASC) 10 MG tablet    Sig: Take 1 tablet (10 mg total) by mouth daily.     Dispense:  90 tablet    Refill:  1  . pravastatin (PRAVACHOL) 80 MG tablet    Sig: Take 1 tablet (80 mg total) by mouth daily.    Dispense:  90 tablet    Refill:  1  . celecoxib (CELEBREX) 200 MG capsule    Sig: Take one capsule by mouth daily up to 4 times a week as needed for pain.    Dispense:  90 capsule    Refill:  1  . lansoprazole (PREVACID) 30 MG capsule    Sig: TAKE 1 CAPSULE BY MOUTH DAILY.    Dispense:  90 capsule    Refill:  1   Encourage weight loss Home physical therapy for hip/consider formal PT if needed/refill Celebrex

## 2014-04-08 ENCOUNTER — Encounter: Payer: Self-pay | Admitting: Internal Medicine

## 2014-04-11 ENCOUNTER — Encounter: Payer: Self-pay | Admitting: Internal Medicine

## 2014-05-10 ENCOUNTER — Other Ambulatory Visit: Payer: Self-pay

## 2014-05-10 MED ORDER — CELECOXIB 200 MG PO CAPS: ORAL_CAPSULE | ORAL | Status: DC

## 2014-05-10 NOTE — Telephone Encounter (Signed)
Pt called and reported that her dose of celebrex was decreased to just 4 x wk, but that it is not effective taken that way. She needs to take it QD in order for it to work. She has been having to take ibuprofen and/or tylenol and they are not working for her. Her Rx was written for #90, but she stated her pharm only gave her #16. Dr Laney Pastor, is it OK to send in a new Rx for QD dosing?

## 2014-07-30 ENCOUNTER — Other Ambulatory Visit: Payer: Self-pay | Admitting: Orthopedic Surgery

## 2014-07-30 DIAGNOSIS — M48061 Spinal stenosis, lumbar region without neurogenic claudication: Secondary | ICD-10-CM

## 2014-07-31 ENCOUNTER — Ambulatory Visit
Admission: RE | Admit: 2014-07-31 | Discharge: 2014-07-31 | Disposition: A | Discharge status: Home / Self Care | Payer: Self-pay | Source: Ambulatory Visit | Attending: Orthopedic Surgery | Admitting: Orthopedic Surgery

## 2014-07-31 DIAGNOSIS — M48061 Spinal stenosis, lumbar region without neurogenic claudication: Secondary | ICD-10-CM

## 2014-07-31 DIAGNOSIS — Z6841 Body Mass Index (BMI) 40.0 and over, adult: Secondary | ICD-10-CM

## 2014-07-31 MED ORDER — IOHEXOL 180 MG/ML  SOLN: 1.0000 mL | Freq: Once | INTRAMUSCULAR | Status: AC | PRN

## 2014-07-31 MED ORDER — METHYLPREDNISOLONE ACETATE 40 MG/ML INJ SUSP (RADIOLOG: 120.0000 mg | Freq: Once | INTRAMUSCULAR | Status: DC

## 2014-07-31 NOTE — Discharge Instructions (Signed)

## 2014-10-12 ENCOUNTER — Ambulatory Visit (INDEPENDENT_AMBULATORY_CARE_PROVIDER_SITE_OTHER): Payer: 59 | Admitting: Physician Assistant

## 2014-10-12 VITALS — BP 136/82 | HR 81 | Temp 99.2°F | Ht <= 58 in | Wt 214.0 lb

## 2014-10-12 DIAGNOSIS — I10 Essential (primary) hypertension: Secondary | ICD-10-CM | POA: Diagnosis not present

## 2014-10-12 DIAGNOSIS — M1612 Unilateral primary osteoarthritis, left hip: Secondary | ICD-10-CM

## 2014-10-12 DIAGNOSIS — Z6841 Body Mass Index (BMI) 40.0 and over, adult: Secondary | ICD-10-CM | POA: Diagnosis not present

## 2014-10-12 DIAGNOSIS — E119 Type 2 diabetes mellitus without complications: Secondary | ICD-10-CM | POA: Diagnosis not present

## 2014-10-12 DIAGNOSIS — E7800 Pure hypercholesterolemia, unspecified: Secondary | ICD-10-CM

## 2014-10-12 DIAGNOSIS — K219 Gastro-esophageal reflux disease without esophagitis: Secondary | ICD-10-CM

## 2014-10-12 DIAGNOSIS — D509 Iron deficiency anemia, unspecified: Secondary | ICD-10-CM | POA: Diagnosis not present

## 2014-10-12 DIAGNOSIS — IMO0001 Reserved for inherently not codable concepts without codable children: Secondary | ICD-10-CM

## 2014-10-12 LAB — LIPID PANEL
Cholesterol: 161 mg/dL (ref 0–200)
HDL: 42 mg/dL — ABNORMAL LOW (ref >=46)
LDL Cholesterol: 98 mg/dL (ref 0–99)
Total CHOL/HDL Ratio: 3.8 Ratio
Triglycerides: 104 mg/dL (ref <150)
VLDL: 21 mg/dL (ref 0–40)

## 2014-10-12 LAB — COMPLETE METABOLIC PANEL WITH GFR
ALT: 14 U/L (ref 0–35)
AST: 12 U/L (ref 0–37)
Albumin: 4.6 g/dL (ref 3.5–5.2)
Alkaline Phosphatase: 58 U/L (ref 39–117)
BUN: 12 mg/dL (ref 6–23)
CO2: 27 mEq/L (ref 19–32)
Calcium: 10.1 mg/dL (ref 8.4–10.5)
Chloride: 102 mEq/L (ref 96–112)
Creat: 0.77 mg/dL (ref 0.50–1.10)
GFR, Est African American: >89 mL/min
GFR, Est Non African American: 86 mL/min
Glucose, Bld: 114 mg/dL — ABNORMAL HIGH (ref 70–99)
Potassium: 3.9 mEq/L (ref 3.5–5.3)
Sodium: 141 mEq/L (ref 135–145)
Total Bilirubin: 0.3 mg/dL (ref 0.2–1.2)
Total Protein: 7.5 g/dL (ref 6.0–8.3)

## 2014-10-12 LAB — MICROALBUMIN, URINE: Microalb, Ur: 17 mg/dL — ABNORMAL HIGH (ref <2.0)

## 2014-10-12 LAB — HEMOGLOBIN A1C
Hgb A1c MFr Bld: 7.4 % — ABNORMAL HIGH (ref <5.7)
Mean Plasma Glucose: 166 mg/dL — ABNORMAL HIGH (ref <117)

## 2014-10-12 MED ORDER — POLYSACCHARIDE IRON COMPLEX 150 MG PO CAPS: 150.0000 mg | ORAL_CAPSULE | Freq: Two times a day (BID) | ORAL | Status: DC

## 2014-10-12 MED ORDER — CELECOXIB 200 MG PO CAPS
ORAL_CAPSULE | ORAL | Status: DC
Start: 2014-10-12 — End: 2015-04-08

## 2014-10-12 MED ORDER — METFORMIN HCL 1000 MG PO TABS: 1000.0000 mg | ORAL_TABLET | Freq: Two times a day (BID) | ORAL | Status: DC

## 2014-10-12 MED ORDER — METOPROLOL SUCCINATE ER 100 MG PO TB24: ORAL_TABLET | ORAL | Status: DC

## 2014-10-12 MED ORDER — AMLODIPINE BESYLATE 10 MG PO TABS: 10.0000 mg | ORAL_TABLET | Freq: Every day | ORAL | Status: DC

## 2014-10-12 MED ORDER — LANSOPRAZOLE 30 MG PO CPDR: DELAYED_RELEASE_CAPSULE | ORAL | Status: DC

## 2014-10-12 MED ORDER — LOSARTAN POTASSIUM-HCTZ 100-25 MG PO TABS: ORAL_TABLET | ORAL | Status: DC

## 2014-10-12 NOTE — Patient Instructions (Signed)
I will contact you with your lab results as soon as they are available.   If you have not heard from me in 2 weeks, please contact me.  The fastest way to get your results is to register for My Chart (see the instructions on the last page of this printout).   

## 2014-10-12 NOTE — Progress Notes (Signed)
Subjective:    Patient ID: [name], female    DOB: [birth year], 57 y.o.   MRN: [id number]  HPI  Pt presents to clinic for recheck of her chronic medical problems.  She does not check her BP at home.  She will sometimes check her BS and it runs in the 160-170s fasting.  She otherwise feels fine.  She does not exercise because of her lumbar stenosis and hip arthritis and the pain.  Review of Systems  Constitutional: Negative for fever and chills.  Respiratory: Negative for shortness of breath.   Cardiovascular: Negative for chest pain.  Neurological: Negative for numbness.   Patient Active Problem List   Diagnosis Date Noted  . Osteoarthritis of left hip 04/07/2014  . BMI 40.0-44.9, adult 04/07/2014  . DM type 2 (diabetes mellitus, type 2)   . HTN (hypertension)   . Hypercholesterolemia   . Reflux    The patient has a current medication list which includes the following prescription(s): albuterol, amlodipine, celecoxib, iron polysaccharides, lansoprazole, losartan-hydrochlorothiazide, metformin, metoprolol succinate, and pravastatin. Allergies  Allergen Reactions  . Sulfa Antibiotics Hives    Medications, allergies, past medical history, surgical history, family history, social history and problem list reviewed and updated.   Objective:   Physical Exam  Constitutional: She is oriented to person, place, and time. She appears well-developed and well-nourished.  BP 136/82 mmHg  Pulse 81  Temp(Src) 99.2 F (37.3 C) (Oral)  Ht 4\' 10"  (1.473 m)  Wt 214 lb (97.07 kg)  BMI 44.74 kg/m2  SpO2 97%   HENT:  Head: Normocephalic and atraumatic.  Right Ear: External ear normal.  Left Ear: External ear normal.  Eyes: Conjunctivae are normal.  Neck: Normal range of motion.  Cardiovascular: Normal rate and regular rhythm.   Murmur heard.  Systolic murmur is present with a grade of 2/6  Pulmonary/Chest: Effort normal and breath sounds normal. She has no wheezes.    Musculoskeletal:  Diabetic Foot Exam - Simple   Simple Foot Form  Diabetic Foot exam was performed with the following findings:  Yes  10/12/2014 11:44 AM  Visual Inspection  No deformities, no ulcerations, no other skin breakdown bilaterally:  Yes  Sensation Testing  Intact to touch and monofilament testing bilaterally:  Yes  Pulse Check  Posterior Tibialis and Dorsalis pulse intact bilaterally:  Yes    Neurological: She is alert and oriented to person, place, and time.  Skin: Skin is warm and dry.  Psychiatric: She has a normal mood and affect. Her behavior is normal. Judgment and thought content normal.       Assessment & Plan:  Essential hypertension - Plan: COMPLETE METABOLIC PANEL WITH GFR, losartan-hydrochlorothiazide (HYZAAR) 100-25 MG per tablet, amLODipine (NORVASC) 10 MG tablet, metoprolol succinate (TOPROL-XL) 100 MG 24 hr tablet  Type 2 diabetes mellitus without complication - Plan: HM DIABETES FOOT EXAM, Hemoglobin A1c, metFORMIN (GLUCOPHAGE) 1000 MG tablet, Microalbumin, urine  Hypercholesterolemia - Plan: Lipid panel  Iron deficiency anemia - Plan: iron polysaccharides (NIFEREX) 150 MG capsule  Reflux - Plan: lansoprazole (PREVACID) 30 MG capsule  Osteoarthritis of left hip, unspecified osteoarthritis type - Plan: celecoxib (CELEBREX) 200 MG capsule   Obesity Class III  Continue current medications.  We will wait for labs to come back to determine her statin medication.  We may need to increase her diabetes medications depending on her A1C.  We discussed that weight loss will help with all her chronic medication problems but water exercise will probably aggravate  her OA and stenosis the least.  We discussed placed in [place] that might help her.    Windell Hummingbird PA-C  [facility] 10/12/2014 11:57 AM

## 2014-10-13 ENCOUNTER — Encounter: Payer: Self-pay | Admitting: Physician Assistant

## 2014-10-13 MED ORDER — PRAVASTATIN SODIUM 80 MG PO TABS: 80.0000 mg | ORAL_TABLET | Freq: Every day | ORAL | Status: DC

## 2014-10-13 NOTE — Addendum Note (Signed)
Addended by: Mancel Bale on: 10/13/2014 10:57 AM   Modules accepted: Orders, SmartSet

## 2014-12-24 ENCOUNTER — Other Ambulatory Visit (HOSPITAL_COMMUNITY): Payer: Self-pay | Admitting: Orthopedic Surgery

## 2014-12-24 DIAGNOSIS — M5416 Radiculopathy, lumbar region: Secondary | ICD-10-CM

## 2014-12-27 ENCOUNTER — Ambulatory Visit (HOSPITAL_COMMUNITY)
Admission: RE | Admit: 2014-12-27 | Discharge: 2014-12-27 | Disposition: A | Discharge status: Home / Self Care | Payer: PRIVATE HEALTH INSURANCE | Source: Ambulatory Visit | Attending: Orthopedic Surgery | Admitting: Orthopedic Surgery

## 2014-12-27 DIAGNOSIS — M47896 Other spondylosis, lumbar region: Secondary | ICD-10-CM | POA: Diagnosis not present

## 2014-12-27 DIAGNOSIS — M5416 Radiculopathy, lumbar region: Secondary | ICD-10-CM

## 2014-12-27 DIAGNOSIS — M545 Low back pain: Secondary | ICD-10-CM | POA: Diagnosis present

## 2015-02-19 ENCOUNTER — Other Ambulatory Visit (HOSPITAL_COMMUNITY)
Admission: RE | Admit: 2015-02-19 | Discharge: 2015-02-19 | Disposition: A | Discharge status: Home / Self Care | Payer: 59 | Source: Ambulatory Visit | Attending: Obstetrics and Gynecology | Admitting: Obstetrics and Gynecology

## 2015-02-19 ENCOUNTER — Other Ambulatory Visit: Payer: Self-pay | Admitting: Obstetrics and Gynecology

## 2015-02-19 DIAGNOSIS — Z1151 Encounter for screening for human papillomavirus (HPV): Secondary | ICD-10-CM | POA: Diagnosis not present

## 2015-02-19 DIAGNOSIS — Z01419 Encounter for gynecological examination (general) (routine) without abnormal findings: Secondary | ICD-10-CM | POA: Diagnosis present

## 2015-02-21 LAB — CYTOLOGY - PAP

## 2015-04-08 ENCOUNTER — Ambulatory Visit (INDEPENDENT_AMBULATORY_CARE_PROVIDER_SITE_OTHER): Payer: 59 | Admitting: Family Medicine

## 2015-04-08 VITALS — BP 128/86 | HR 102 | Temp 98.6°F | Resp 18 | Ht <= 58 in | Wt 207.8 lb

## 2015-04-08 DIAGNOSIS — Z0000 Encounter for general adult medical examination without abnormal findings: Secondary | ICD-10-CM

## 2015-04-08 DIAGNOSIS — IMO0001 Reserved for inherently not codable concepts without codable children: Secondary | ICD-10-CM

## 2015-04-08 DIAGNOSIS — R809 Proteinuria, unspecified: Secondary | ICD-10-CM | POA: Diagnosis not present

## 2015-04-08 DIAGNOSIS — E119 Type 2 diabetes mellitus without complications: Secondary | ICD-10-CM

## 2015-04-08 DIAGNOSIS — K219 Gastro-esophageal reflux disease without esophagitis: Secondary | ICD-10-CM | POA: Diagnosis not present

## 2015-04-08 DIAGNOSIS — I10 Essential (primary) hypertension: Secondary | ICD-10-CM

## 2015-04-08 DIAGNOSIS — D509 Iron deficiency anemia, unspecified: Secondary | ICD-10-CM

## 2015-04-08 DIAGNOSIS — M1612 Unilateral primary osteoarthritis, left hip: Secondary | ICD-10-CM | POA: Diagnosis not present

## 2015-04-08 LAB — POCT CBC
Granulocyte percent: 52.1 %G (ref 37–80)
HCT, POC: 38.9 % (ref 37.7–47.9)
Hemoglobin: 12.5 g/dL (ref 12.2–16.2)
Lymph, poc: 3.4 (ref 0.6–3.4)
MCH, POC: 23.8 pg — AB (ref 27–31.2)
MCHC: 32.2 g/dL (ref 31.8–35.4)
MCV: 73.9 fL — AB (ref 80–97)
MID (cbc): 0.6 (ref 0–0.9)
MPV: 8.3 fL (ref 0–99.8)
POC Granulocyte: 4.3 (ref 2–6.9)
POC LYMPH PERCENT: 40.5 %L (ref 10–50)
POC MID %: 7.4 %M (ref 0–12)
Platelet Count, POC: 299 10*3/uL (ref 142–424)
RBC: 5.27 M/uL (ref 4.04–5.48)
RDW, POC: 17.9 %
WBC: 8.3 10*3/uL (ref 4.6–10.2)

## 2015-04-08 LAB — LIPID PANEL
Cholesterol: 155 mg/dL (ref 125–200)
HDL: 44 mg/dL — ABNORMAL LOW (ref >=46)
LDL Cholesterol: 82 mg/dL (ref <130)
Total CHOL/HDL Ratio: 3.5 Ratio (ref <=5.0)
Triglycerides: 147 mg/dL (ref <150)
VLDL: 29 mg/dL (ref <30)

## 2015-04-08 LAB — COMPREHENSIVE METABOLIC PANEL
ALT: 15 U/L (ref 6–29)
AST: 15 U/L (ref 10–35)
Albumin: 4.9 g/dL (ref 3.6–5.1)
Alkaline Phosphatase: 68 U/L (ref 33–130)
BUN: 15 mg/dL (ref 7–25)
CO2: 30 mmol/L (ref 20–31)
Calcium: 10.2 mg/dL (ref 8.6–10.4)
Chloride: 100 mmol/L (ref 98–110)
Creat: 0.81 mg/dL (ref 0.50–1.05)
Glucose, Bld: 115 mg/dL — ABNORMAL HIGH (ref 65–99)
Potassium: 3.8 mmol/L (ref 3.5–5.3)
Sodium: 142 mmol/L (ref 135–146)
Total Bilirubin: 0.4 mg/dL (ref 0.2–1.2)
Total Protein: 8 g/dL (ref 6.1–8.1)

## 2015-04-08 LAB — HEMOGLOBIN A1C: Hgb A1c MFr Bld: 7.2 % — AB (ref 4.0–6.0)

## 2015-04-08 LAB — POCT GLYCOSYLATED HEMOGLOBIN (HGB A1C): Hemoglobin A1C: 7.2

## 2015-04-08 MED ORDER — LOSARTAN POTASSIUM-HCTZ 100-25 MG PO TABS: ORAL_TABLET | ORAL | Status: DC

## 2015-04-08 MED ORDER — AMLODIPINE BESYLATE 10 MG PO TABS: 10.0000 mg | ORAL_TABLET | Freq: Every day | ORAL | Status: DC

## 2015-04-08 MED ORDER — METOPROLOL SUCCINATE ER 100 MG PO TB24: ORAL_TABLET | ORAL | Status: DC

## 2015-04-08 MED ORDER — PRAVASTATIN SODIUM 80 MG PO TABS: 80.0000 mg | ORAL_TABLET | Freq: Every day | ORAL | Status: DC

## 2015-04-08 MED ORDER — METFORMIN HCL 1000 MG PO TABS: 1000.0000 mg | ORAL_TABLET | Freq: Two times a day (BID) | ORAL | Status: DC

## 2015-04-08 MED ORDER — POLYSACCHARIDE IRON COMPLEX 150 MG PO CAPS: 150.0000 mg | ORAL_CAPSULE | Freq: Two times a day (BID) | ORAL | Status: DC

## 2015-04-08 MED ORDER — CELECOXIB 200 MG PO CAPS: ORAL_CAPSULE | ORAL | Status: DC

## 2015-04-08 MED ORDER — LANSOPRAZOLE 30 MG PO CPDR: DELAYED_RELEASE_CAPSULE | ORAL | Status: DC

## 2015-04-08 NOTE — Progress Notes (Signed)
Annual physical examination  History: Patient is here for her annual physical examination. It was due, and she had no major acute complaints. She does need some medications refilled which will be documented in a separate note.  Past medical history: She has had unnecessary dissection and tubes tied History of diabetes, hypertension, hyperlipidemia, chronic low back pain, anemia which was iron deficient Allergies: Sulfa Medications: See list Gravida 3 para 3  Social history: She works as a Chartered certified accountant at [employer]. She is married happily. She is only sexually involved with her husband. They do have 2 foster children which keep her busy. She is involved in bowel study fellowship. She does not smoke, drink, or use drugs. She does some walking. She has lost little weight.  Family history: Father and brother died with strokes. Mother with heart disease.  Review of systems: Constitutional: Unremarkable HEENT: Unremarkable Cardiovascular: Remarkable Respiratory: Some shortness of breath on exertion if she climbs stairs GI: Remarkable GU: Normal Muscular skeletal has chronic low back pain and some right hip pain Dermatologic: Unremarkable Neurologic: Unremarkable Psychiatric: Normal Endocrinologic: Doesn't check her sugars She has had a mammogram and breast exam in September.  Physical exam: Obese lady, pleasant alert and oriented, in no acute distress. TMs are normal. Eyes PERRLA. Fundi benign. Throat clear. Neck supple without nodes or thyromegaly. No carotid bruits. Chest is clear to auscultation. Heart regular. She has a history of a murmur but I did not hear one. Abdomen soft without mass or tenderness. Genitorectal exam not done. She is tender in the lower lumbar spine. No edema. Skin unremarkable.  Assessment: Annual physical examination Obesity Hypertension Hyperlipidemia Low back pain History of present deficiency anemia  Plan: Labs are ordered. See separate  note.  Review of systems

## 2015-04-08 NOTE — Patient Instructions (Addendum)
Continue current medications  Eating less and exercising more with the specific goal of weight loss. That would be tremendous help to your back, diabetes, blood pressure, etc.  Return in about 4-6 months for recheck, sooner if problems  We will let you know the rest of your labs in a few days.

## 2015-04-08 NOTE — Progress Notes (Signed)
Patient ID: [name], female    DOB: [birth year]  Age: 59 y.o. MRN: [id number] Medication refills  Subjective:   Patient is here desiring refills were medicine for blood pressure and diabetes and arthritis in her back. No major acute medical complaints. This was done at the same time that we saw her for her physical examination today.  Current allergies, medications, problem list, past/family and social histories reviewed.  Objective:  BP 128/86 mmHg  Pulse 102  Temp(Src) 98.6 F (37 C) (Oral)  Resp 18  Ht 4\' 10"  (1.473 m)  Wt 207 lb 12.8 oz (94.257 kg)  BMI 43.44 kg/m2  SpO2 98%  Chest clear. Heart regular without murmur. Abdomen soft without mass or tenderness.  Assessment & Plan:   Assessment: 1. Essential hypertension   2. Osteoarthritis of left hip, unspecified osteoarthritis type   3. Iron deficiency anemia   4. Reflux   5. Type 2 diabetes mellitus without complication, without long-term current use of insulin ([place])   6. Microalbuminuria       Plan: Refills submitted.  Orders Placed This Encounter  Procedures  . Microalbumin, urine  . Comprehensive metabolic panel  . Lipid panel  . Ferritin  . POCT glycosylated hemoglobin (Hb A1C)  . POCT CBC    Meds ordered this encounter  Medications  . amLODipine (NORVASC) 10 MG tablet    Sig: Take 1 tablet (10 mg total) by mouth daily.    Dispense:  90 tablet    Refill:  1  . celecoxib (CELEBREX) 200 MG capsule    Sig: Take one capsule by mouth daily as needed for pain.    Dispense:  90 capsule    Refill:  1  . iron polysaccharides (NIFEREX) 150 MG capsule    Sig: Take 1 capsule (150 mg total) by mouth 2 (two) times daily.    Dispense:  180 capsule    Refill:  1  . lansoprazole (PREVACID) 30 MG capsule    Sig: TAKE 1 CAPSULE BY MOUTH DAILY.    Dispense:  90 capsule    Refill:  1  . losartan-hydrochlorothiazide (HYZAAR) 100-25 MG tablet    Sig: TAKE 1 TABLET BY MOUTH DAILY.    Dispense:  90 tablet   Refill:  1  . metFORMIN (GLUCOPHAGE) 1000 MG tablet    Sig: Take 1 tablet (1,000 mg total) by mouth 2 (two) times daily with a meal.    Dispense:  180 tablet    Refill:  1  . metoprolol succinate (TOPROL-XL) 100 MG 24 hr tablet    Sig: TAKE 1 TABLET (100 MG TOTAL) BY MOUTH DAILY. TAKE WITH OR IMMEDIATELY FOLLOWING A MEAL.    Dispense:  90 tablet    Refill:  1  . pravastatin (PRAVACHOL) 80 MG tablet    Sig: Take 1 tablet (80 mg total) by mouth daily.    Dispense:  90 tablet    Refill:  1    Results for orders placed or performed in visit on 04/08/15  POCT glycosylated hemoglobin (Hb A1C)  Result Value Ref Range   Hemoglobin A1C 7.2   POCT CBC  Result Value Ref Range   WBC 8.3 4.6 - 10.2 K/uL   Lymph, poc 3.4 0.6 - 3.4   POC LYMPH PERCENT 40.5 10 - 50 %L   MID (cbc) 0.6 0 - 0.9   POC MID % 7.4 0 - 12 %M   POC Granulocyte 4.3 2 - 6.9   Granulocyte percent 52.1  37 - 80 %G   RBC 5.27 4.04 - 5.48 M/uL   Hemoglobin 12.5 12.2 - 16.2 g/dL   HCT, POC 38.9 37.7 - 47.9 %   MCV 73.9 (A) 80 - 97 fL   MCH, POC 23.8 (A) 27 - 31.2 pg   MCHC 32.2 31.8 - 35.4 g/dL   RDW, POC 17.9 %   Platelet Count, POC 299 142 - 424 K/uL   MPV 8.3 0 - 99.8 fL        Patient Instructions  Continue current medications  Eating less and exercising more with the specific goal of weight loss. That would be tremendous help to your back, diabetes, blood pressure, etc.  Return in about 4-6 months for recheck, sooner if problems     No Follow-up on file.   Zeek Rostron, MD 04/08/2015

## 2015-04-09 LAB — MICROALBUMIN, URINE: Microalb, Ur: 36.6 mg/dL

## 2015-04-09 LAB — FERRITIN: Ferritin: 66 ng/mL (ref 10–291)

## 2015-04-21 ENCOUNTER — Encounter: Payer: Self-pay | Admitting: Family Medicine

## 2015-05-26 MED FILL — METOPROLOL SUCC ER 100 MG T: 100 | 90 days supply | Qty: 90 | Fill #1

## 2015-05-26 MED FILL — PRAVASTATIN SODIUM 80 MG TA: 80 | 90 days supply | Qty: 90 | Fill #1

## 2015-05-26 MED FILL — AMLODIPINE BESYLATE 10 MG T: 10 | 90 days supply | Qty: 90 | Fill #1

## 2015-07-11 MED FILL — LOSARTAN-HCTZ 100-25 MG TAB: 100-25 | 90 days supply | Qty: 90 | Fill #1

## 2015-07-21 MED FILL — metFORMIN HCL 1000 MG TABS: 1000 | 90 days supply | Qty: 180 | Fill #1

## 2015-07-28 MED FILL — LANSOPRAZOLE DR 30 MG CAP: 30 | 30 days supply | Qty: 30 | Fill #1

## 2015-08-07 MED FILL — GABAPENTIN 300 MG CAPSULE: 300 | 30 days supply | Qty: 90 | Fill #0

## 2015-08-13 MED FILL — CELECOXIB 200 MG CAPSULE: 200 | 30 days supply | Qty: 30 | Fill #1

## 2015-08-26 MED FILL — LANSOPRAZOLE DR 30 MG CAP: 30 | 30 days supply | Qty: 30 | Fill #2

## 2015-08-28 MED FILL — PRAVASTATIN SODIUM 80 MG TA: 80 | 90 days supply | Qty: 90 | Fill #0

## 2015-08-28 MED FILL — POLY-IRON 150 MG CAPSULE: 150 | 90 days supply | Qty: 180 | Fill #1

## 2015-08-28 MED FILL — AMLODIPINE BESYLATE 10 MG T: 10 | 90 days supply | Qty: 90 | Fill #0

## 2015-08-28 MED FILL — METOPROLOL SUCC ER 100 MG T: 100 | 90 days supply | Qty: 90 | Fill #0

## 2015-09-10 MED FILL — CELECOXIB 200 MG CAPSULE: 200 | 30 days supply | Qty: 30 | Fill #0

## 2015-09-10 MED FILL — GABAPENTIN 300 MG CAPSULE: 300 | 30 days supply | Qty: 90 | Fill #0

## 2015-10-13 ENCOUNTER — Telehealth: Payer: Self-pay

## 2015-10-13 DIAGNOSIS — I10 Essential (primary) hypertension: Secondary | ICD-10-CM

## 2015-10-13 MED ORDER — LOSARTAN POTASSIUM-HCTZ 100-25 MG PO TABS: ORAL_TABLET | ORAL | Status: DC

## 2015-10-13 MED FILL — LOSARTAN-HCTZ 100-25 MG TAB: 100-25 | 30 days supply | Qty: 30 | Fill #0

## 2015-10-13 MED FILL — CELECOXIB 200 MG CAPSULE: 200 | 30 days supply | Qty: 30 | Fill #1

## 2015-10-13 NOTE — Telephone Encounter (Signed)
Pt is checking on status of losartin refill request that was requested last week   [phone]

## 2015-10-13 NOTE — Telephone Encounter (Signed)
Sent in 30 day RF and advised pt of need for f/up for more. Pt agreed.

## 2015-11-06 MED FILL — LANSOPRAZOLE DR 30 MG CAP: 30 | 30 days supply | Qty: 30 | Fill #3

## 2015-11-12 ENCOUNTER — Ambulatory Visit (INDEPENDENT_AMBULATORY_CARE_PROVIDER_SITE_OTHER): Payer: BLUE CROSS/BLUE SHIELD | Admitting: Family Medicine

## 2015-11-12 VITALS — BP 128/78 | HR 80 | Temp 98.6°F | Resp 16 | Ht <= 58 in | Wt 218.0 lb

## 2015-11-12 DIAGNOSIS — R079 Chest pain, unspecified: Secondary | ICD-10-CM | POA: Diagnosis not present

## 2015-11-12 DIAGNOSIS — E1129 Type 2 diabetes mellitus with other diabetic kidney complication: Secondary | ICD-10-CM

## 2015-11-12 DIAGNOSIS — E785 Hyperlipidemia, unspecified: Secondary | ICD-10-CM | POA: Diagnosis not present

## 2015-11-12 DIAGNOSIS — E119 Type 2 diabetes mellitus without complications: Secondary | ICD-10-CM

## 2015-11-12 DIAGNOSIS — K219 Gastro-esophageal reflux disease without esophagitis: Secondary | ICD-10-CM | POA: Diagnosis not present

## 2015-11-12 DIAGNOSIS — D509 Iron deficiency anemia, unspecified: Secondary | ICD-10-CM | POA: Diagnosis not present

## 2015-11-12 DIAGNOSIS — I10 Essential (primary) hypertension: Secondary | ICD-10-CM | POA: Diagnosis not present

## 2015-11-12 DIAGNOSIS — M159 Polyosteoarthritis, unspecified: Secondary | ICD-10-CM

## 2015-11-12 DIAGNOSIS — M1612 Unilateral primary osteoarthritis, left hip: Secondary | ICD-10-CM

## 2015-11-12 DIAGNOSIS — IMO0001 Reserved for inherently not codable concepts without codable children: Secondary | ICD-10-CM

## 2015-11-12 LAB — LIPID PANEL
Cholesterol: 145 mg/dL (ref 125–200)
HDL: 46 mg/dL (ref >=46)
LDL Cholesterol: 71 mg/dL (ref <130)
Total CHOL/HDL Ratio: 3.2 Ratio (ref <=5.0)
Triglycerides: 140 mg/dL (ref <150)
VLDL: 28 mg/dL (ref <30)

## 2015-11-12 LAB — COMPLETE METABOLIC PANEL WITH GFR
ALT: 12 U/L (ref 6–29)
AST: 14 U/L (ref 10–35)
Albumin: 4.7 g/dL (ref 3.6–5.1)
Alkaline Phosphatase: 68 U/L (ref 33–130)
BUN: 12 mg/dL (ref 7–25)
CO2: 26 mmol/L (ref 20–31)
Calcium: 10 mg/dL (ref 8.6–10.4)
Chloride: 102 mmol/L (ref 98–110)
Creat: 0.79 mg/dL (ref 0.50–1.05)
GFR, Est African American: >89 mL/min (ref >=60)
GFR, Est Non African American: 83 mL/min (ref >=60)
Glucose, Bld: 131 mg/dL — ABNORMAL HIGH (ref 65–99)
Potassium: 3.3 mmol/L — ABNORMAL LOW (ref 3.5–5.3)
Sodium: 143 mmol/L (ref 135–146)
Total Bilirubin: 0.4 mg/dL (ref 0.2–1.2)
Total Protein: 8 g/dL (ref 6.1–8.1)

## 2015-11-12 LAB — POCT GLYCOSYLATED HEMOGLOBIN (HGB A1C): Hemoglobin A1C: 7.2

## 2015-11-12 LAB — GLUCOSE, POCT (MANUAL RESULT ENTRY): POC Glucose: 143 mg/dl — AB (ref 70–99)

## 2015-11-12 MED ORDER — METOPROLOL SUCCINATE ER 100 MG PO TB24: ORAL_TABLET | ORAL | Status: DC

## 2015-11-12 MED ORDER — AMLODIPINE BESYLATE 10 MG PO TABS: 10.0000 mg | ORAL_TABLET | Freq: Every day | ORAL | Status: DC

## 2015-11-12 MED ORDER — PRAVASTATIN SODIUM 80 MG PO TABS: 80.0000 mg | ORAL_TABLET | Freq: Every day | ORAL | Status: DC

## 2015-11-12 MED ORDER — LOSARTAN POTASSIUM-HCTZ 100-25 MG PO TABS: ORAL_TABLET | ORAL | Status: DC

## 2015-11-12 MED ORDER — CELECOXIB 200 MG PO CAPS: ORAL_CAPSULE | ORAL | Status: DC

## 2015-11-12 MED ORDER — METFORMIN HCL 1000 MG PO TABS: 1000.0000 mg | ORAL_TABLET | Freq: Two times a day (BID) | ORAL | Status: DC

## 2015-11-12 MED ORDER — POLYSACCHARIDE IRON COMPLEX 150 MG PO CAPS: 150.0000 mg | ORAL_CAPSULE | Freq: Every day | ORAL | Status: DC

## 2015-11-12 MED ORDER — LANSOPRAZOLE 30 MG PO CPDR: DELAYED_RELEASE_CAPSULE | ORAL | Status: DC

## 2015-11-12 MED FILL — LOSARTAN-HCTZ 100-25 MG TAB: 100-25 | 90 days supply | Qty: 90 | Fill #0

## 2015-11-12 MED FILL — metFORMIN HCL 1000 MG TABS: 1000 | 90 days supply | Qty: 180 | Fill #0

## 2015-11-12 MED FILL — CELECOXIB 200 MG CAPSULE: 200 | 30 days supply | Qty: 30 | Fill #0

## 2015-11-12 NOTE — Progress Notes (Signed)
By signing my name below, I, Mesha Guinyard, attest that this documentation has been prepared under the direction and in the presence of Merri Ray, MD.  Electronically Signed: Verlee Monte, Medical Scribe. 11/12/2015. 2:25 PM.  Subjective:    Patient ID: [name], female    DOB: [birth year], 60 y.o.   MRN: [id number]  HPI Chief Complaint  Patient presents with  . Medication Refill    All except albuterol     HPI Comments: [name] is a 60 y.o. female with a PMHx of HTN, DM, HLD, and left hip osteoarthritis who presents to the [facility] for all medication refills. Pt states the last MRI she had showed she had arthritis in her back and hip. Pt needs a 2 level effusion which would make her back about 60% better- pt isn't sure if she should get it. Pt has been taking Celebrex everyday since 2014.  DM: She takes1 Metformin tablet 1000 mg BID. Pt has been diabetic since she was 60 y/o. Pt started going to [employer] about 3 days a week- pt bikes. Pt wasn't going too much exercise before then. Pt denies having any side affects from Metformin. Pt hasn't skipped any of her pills for the day. Lab Results  Component Value Date   HGBA1C 7.2 04/08/2015   Lab Results  Component Value Date   MICROALBUR 36.6 04/08/2015   Wt Readings from Last 3 Encounters:  11/12/15 218 lb (98.884 kg)  04/08/15 207 lb 12.8 oz (94.257 kg)  12/27/14 210 lb (95.255 kg)   HTN: Take Norvasc, Hyzaar, and Toprol for bp. Pt states she had episode of fleeting chest pain in [place] while she was at the movies. Pt denies chest pain while at the gym or with exertion. Pt denies chest tightness, n/v, dyspnea, diaphoresis with the chest pain. Pt denies having any side affects from her medications. Pt has never had a stress test done before. No recent chest pain.  FHx: Dad had a heart attack at 39 y/o and passed from it.  Lab Results  Component Value Date   CREATININE 0.81  04/08/2015   HLD: Pt takes Pravachol 80 mg daily and denies any side affects from her medication. Lab Results  Component Value Date   CHOL 155 04/08/2015   HDL 44* 04/08/2015   LDLCALC 82 04/08/2015   TRIG 147 04/08/2015   CHOLHDL 3.5 04/08/2015   Lab Results  Component Value Date   ALT 15 04/08/2015   AST 15 04/08/2015   ALKPHOS 68 04/08/2015   BILITOT 0.4 04/08/2015   GERD: Prevacid 30 mg QD Pt was told she has acid reflux. Pt denies having ulcers in esophagus.  Anemia: Pt takes 1 iron a day. Lab Results  Component Value Date   WBC 8.3 04/08/2015   HGB 12.5 04/08/2015   HCT 38.9 04/08/2015   MCV 73.9* 04/08/2015   PLT 329 04/07/2014    Patient Active Problem List   Diagnosis Date Noted  . Osteoarthritis of left hip 04/07/2014  . BMI 40.0-44.9, adult ([place]) 04/07/2014  . DM type 2 (diabetes mellitus, type 2) ([place])   . HTN (hypertension)   . Hypercholesterolemia   . Reflux    Past Medical History  Diagnosis Date  . DM type 2 (diabetes mellitus, type 2) ([place])   . HTN (hypertension)   . Hypercholesterolemia   . Reflux   . Anemia   . Arthritis   . Heart murmur    Past  Surgical History  Procedure Laterality Date  . Cesarean section      x3  . Tubal ligation     Allergies  Allergen Reactions  . Sulfa Antibiotics Hives   Prior to Admission medications   Medication Sig Start Date End Date Taking? Authorizing Provider  albuterol (PROAIR HFA) 108 (90 BASE) MCG/ACT inhaler Inhale 2 puffs into the lungs every 6 (six) hours as needed. 07/21/13   Roselee Culver, MD  amLODipine (NORVASC) 10 MG tablet Take 1 tablet (10 mg total) by mouth daily. 04/08/15   Posey Boyer, MD  celecoxib (CELEBREX) 200 MG capsule Take one capsule by mouth daily as needed for pain. 04/08/15   Posey Boyer, MD  iron polysaccharides (NIFEREX) 150 MG capsule Take 1 capsule (150 mg total) by mouth 2 (two) times daily. 04/08/15   Posey Boyer, MD  lansoprazole (PREVACID) 30 MG capsule  TAKE 1 CAPSULE BY MOUTH DAILY. 04/08/15   Posey Boyer, MD  losartan-hydrochlorothiazide (HYZAAR) 100-25 MG tablet TAKE 1 TABLET BY MOUTH DAILY. 10/13/15   Dorian Heckle English, PA  metFORMIN (GLUCOPHAGE) 1000 MG tablet Take 1 tablet (1,000 mg total) by mouth 2 (two) times daily with a meal. 04/08/15   Posey Boyer, MD  metoprolol succinate (TOPROL-XL) 100 MG 24 hr tablet TAKE 1 TABLET (100 MG TOTAL) BY MOUTH DAILY. TAKE WITH OR IMMEDIATELY FOLLOWING A MEAL. 04/08/15   Posey Boyer, MD  pravastatin (PRAVACHOL) 80 MG tablet Take 1 tablet (80 mg total) by mouth daily. 04/08/15   Posey Boyer, MD   Social History   Social History  . Marital Status: Married    Spouse Name: N/A  . Number of Children: N/A  . Years of Education: N/A   Occupational History  . Not on file.   Social History Main Topics  . Smoking status: Never Smoker   . Smokeless tobacco: Never Used  . Alcohol Use: No  . Drug Use: No  . Sexual Activity: Yes   Other Topics Concern  . Not on file   Social History Narrative   Review of Systems  Constitutional: Negative for diaphoresis, fatigue and unexpected weight change.  Respiratory: Negative for cough, chest tightness and shortness of breath.   Cardiovascular: Positive for chest pain (intermittitent). Negative for palpitations and leg swelling.  Gastrointestinal: Negative for abdominal pain and blood in stool.  Neurological: Negative for dizziness, syncope, light-headedness and headaches.   Objective:  BP 128/78 mmHg  Pulse 80  Temp(Src) 98.6 F (37 C) (Oral)  Resp 16  Ht 4\' 10"  (1.473 m)  Wt 218 lb (98.884 kg)  BMI 45.57 kg/m2  SpO2 98%  Physical Exam  Constitutional: She is oriented to person, place, and time. She appears well-developed and well-nourished.  HENT:  Head: Normocephalic and atraumatic.  Eyes: Conjunctivae and EOM are normal. Pupils are equal, round, and reactive to light.  Neck: Carotid bruit is not present.  Cardiovascular: Normal  rate, regular rhythm, normal heart sounds and intact distal pulses.   Pulmonary/Chest: Effort normal and breath sounds normal.  Abdominal: Soft. She exhibits no pulsatile midline mass. There is no tenderness.  Neurological: She is alert and oriented to person, place, and time.  Skin: Skin is warm and dry.  Psychiatric: She has a normal mood and affect. Her behavior is normal.  Vitals reviewed.  Results for orders placed or performed in visit on 11/12/15  POCT glycosylated hemoglobin (Hb A1C)  Result Value Ref Range   Hemoglobin  A1C 7.2   POCT glucose (manual entry)  Result Value Ref Range   POC Glucose 143 (A) 70 - 99 mg/dl   EKG Reading: Sinus rhythm- Diffuse nonspecific T wave abnormality. Appears to be new from 2013- no other acute findings.  Assessment & Plan:   [name] is a 60 y.o. 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 Consider adding medication at that time if not under 7.  Chest pain, unspecified chest pain type - Plan: EKG 12-Lead, Ambulatory referral to Cardiology  -Atypical, and rare symptom. No recent symptoms. With family history, and personal history of diabetes, hypertension, hyperlipidemia, will refer to cardiology for eval and possible stress testing.  Gastroesophageal reflux disease, esophagitis presence not specified, Reflux - Plan: lansoprazole (PREVACID) 30 MG capsule  -Stable. Trigger avoidance discussed, PPI only as needed.  Hyperlipidemia - Plan: COMPLETE METABOLIC PANEL WITH GFR, Lipid panel, Ambulatory referral to Cardiology  -Labs pending. Continue same dose pravastatin for now as tolerating.  Essential hypertension -  Plan: EKG 12-Lead, COMPLETE METABOLIC PANEL WITH GFR, amLODipine (NORVASC) 10 MG tablet, losartan-hydrochlorothiazide (HYZAAR) 100-25 MG tablet, metoprolol succinate (TOPROL-XL) 100 MG 24 hr tablet  -Stable. Tolerating medication. No change in doses. Labs pending  Osteoarthritis of multiple joints, unspecified osteoarthritis type Osteoarthritis of left hip, unspecified osteoarthritis type - Plan: celecoxib (CELEBREX) 200 MG capsule  -Refilled Celebrex for now, but cardiac risks discussed, especially with her other cardiac risk factors. Recommend she discuss this with cardiology to see if they feel she is okay to continue this, but for now recommended Tylenol initially, consider pain management eval if other medication needed. Understanding expressed.  Iron deficiency anemia - Plan: iron polysaccharides (NIFEREX) 150 MG capsule  -Tolerating iron, refilled.   Meds ordered this encounter  Medications  . amLODipine (NORVASC) 10 MG tablet    Sig: Take 1 tablet (10 mg total) by mouth daily.    Dispense:  90 tablet    Refill:  1  . celecoxib (CELEBREX) 200 MG capsule    Sig: Take one capsule by mouth daily as needed for pain.    Dispense:  30 capsule    Refill:  2  . iron polysaccharides (NIFEREX) 150 MG capsule    Sig: Take 1 capsule (150 mg total) by mouth daily.    Dispense:  180 capsule    Refill:  1  . lansoprazole (PREVACID) 30 MG capsule    Sig: TAKE 1 CAPSULE BY MOUTH DAILY.    Dispense:  90 capsule    Refill:  1  . losartan-hydrochlorothiazide (HYZAAR) 100-25 MG tablet    Sig: TAKE 1 TABLET BY MOUTH DAILY.    Dispense:  90 tablet    Refill:  1  . metoprolol succinate (TOPROL-XL) 100 MG 24 hr tablet    Sig: TAKE 1 TABLET (100 MG TOTAL) BY MOUTH DAILY. TAKE WITH OR IMMEDIATELY FOLLOWING A MEAL.    Dispense:  90 tablet    Refill:  1  . pravastatin (PRAVACHOL) 80 MG tablet    Sig: Take 1 tablet (80 mg total) by mouth daily.    Dispense:  90 tablet    Refill:  1  . metFORMIN  (GLUCOPHAGE) 1000 MG tablet    Sig:  Take 1 tablet (1,000 mg total) by mouth 2 (two) times daily with a meal.    Dispense:  180 tablet    Refill:  1   Patient Instructions     We recommend that you schedule a mammogram for breast cancer screening. Typically, you do not need a referral to do this. Please contact a local imaging center to schedule your mammogram.  [facility] - [phone]  *ask for the Radiology Department [facility] - [phone] or [phone]  [facility] - [phone] [place] [phone] [facility] - [phone]  *ask for the [facility] - [phone]  *ask for the Radiology Department [facility] - [phone]  *ask for the [place] - [phone]       IF you received an x-ray today, you will receive an invoice from [facility]. Please contact [facility]iology at [phone] with questions or concerns regarding your invoice.   IF you received labwork today, you will receive an invoice from [employer]. Please contact [facility] at [phone] with questions or concerns regarding your invoice.   Our billing staff will not be able to assist you with questions regarding bills from these companies.  You will be contacted with the lab results as soon as they are available. The fastest way to get your results is to activate your My Chart account. Instructions are located on the last page of this paperwork. If you have not heard from [place] regarding the results in 2 weeks, please contact this office.    I will refer you to a cardiologist for stress testing or to discuss other testing for heart disease. I would also discuss with them the safety of using Celebrex. I'm concerned with your history of diabetes and family history of heart disease of using a  medicine like Celebrex every day. I did refill this temporarily, but if you are persistently needing Celebrex, would recommend you meet with a pain management specialist or your orthopedist to discuss other options besides NSAIDs or medicine like Celebrex. Tylenol over-the-counter would be okay to try in its place if possible.   No change in blood pressure medication for now, cholesterol medications same dose for now as well. We will check your lab work today, follow-up the next 3 months.  Increase exercise/activity such as walking to help with weight loss.   Before you significantly increase your exercise regimen, would meet with the cardiologist first to help guide you in the appropriate type of exercise.    3 month blood sugar average is unchanged, but I suspect this will improve with your diet changes, and increased activity. We can continue the same dose of metformin for now, but if A1c remains above 7 at follow-up in 3 months, can discuss other medication options at that time.  Your EKG overall looked okay, but there were few possible slight changes from 2013. This can also be discussed at your cardiologist appointment.If any recurrence of chest pain, return here or the emergency room.  See list of foods below to avoid with heartburn, and use only the amount of Prevacid you need. You can try every other day, or up to once per day.  Food Choices for Gastroesophageal Reflux Disease, Adult When you have gastroesophageal reflux disease (GERD), the foods you eat and your eating habits are very important. Choosing the right foods can help ease the discomfort  of GERD. WHAT GENERAL GUIDELINES DO I NEED TO FOLLOW?  Choose fruits, vegetables, whole grains, low-fat dairy products, and low-fat meat, fish, and poultry.  Limit fats such as oils, salad dressings, butter, nuts, and avocado.  Keep a food diary to identify foods that cause symptoms.  Avoid foods that cause reflux. These may be different  for different people.  Eat frequent small meals instead of three large meals each day.  Eat your meals slowly, in a relaxed setting.  Limit fried foods.  Cook foods using methods other than frying.  Avoid drinking alcohol.  Avoid drinking large amounts of liquids with your meals.  Avoid bending over or lying down until 2-3 hours after eating. WHAT FOODS ARE NOT RECOMMENDED? The following are some foods and drinks that may worsen your symptoms: Vegetables Tomatoes. Tomato juice. Tomato and spaghetti sauce. Chili peppers. Onion and garlic. Horseradish. Fruits Oranges, grapefruit, and lemon (fruit and juice). Meats High-fat meats, fish, and poultry. This includes hot dogs, ribs, ham, sausage, salami, and bacon. Dairy Whole milk and chocolate milk. Sour cream. Cream. Butter. Ice cream. Cream cheese.  Beverages Coffee and tea, with or without caffeine. Carbonated beverages or energy drinks. Condiments Hot sauce. Barbecue sauce.  Sweets/Desserts Chocolate and cocoa. Donuts. Peppermint and spearmint. Fats and Oils High-fat foods, including [place] fries and potato chips. Other Vinegar. Strong spices, such as black pepper, white pepper, red pepper, cayenne, curry powder, cloves, ginger, and chili powder. The items listed above may not be a complete list of foods and beverages to avoid. Contact your dietitian for more information.   This information is not intended to replace advice given to you by your health care provider. Make sure you discuss any questions you have with your health care provider.   Document Released: 05/10/2005 Document Revised: 05/31/2014 Document Reviewed: 03/14/2013 Elsevier Interactive Patient Education [employer].       I personally performed the services described in this documentation, which was scribed in my presence. The recorded information has been reviewed and considered, and addended by me as needed.   Signed,   Merri Ray,  MD [facility].  11/13/2015 9:19 AM

## 2015-11-12 NOTE — Patient Instructions (Addendum)
We recommend that you schedule a mammogram for breast cancer screening. Typically, you do not need a referral to do this. Please contact a local imaging center to schedule your mammogram.  [facility] - [phone]  *ask for the Radiology Department [facility] ([place]) - [phone] or [phone]  [facility] - [phone] [place] [phone] [facility] - [phone]  *ask for the [facility] - [phone]  *ask for the Radiology Department [facility] - [phone]  *ask for the [place] - [phone]       IF you received an x-ray today, you will receive an invoice from [facility] Radiology. Please contact [facility] Radiology at [phone] with questions or concerns regarding your invoice.   IF you received labwork today, you will receive an invoice from [employer]. Please contact [facility] at [phone] with questions or concerns regarding your invoice.   Our billing staff will not be able to assist you with questions regarding bills from these companies.  You will be contacted with the lab results as soon as they are available. The fastest way to get your results is to activate your My Chart account. Instructions are located on the last page of this paperwork. If you have not heard from [place] regarding the results in 2 weeks, please contact this office.    I will refer you to a cardiologist for stress testing or to discuss other testing for heart disease. I would also discuss with them the safety of using Celebrex. I'm concerned with your history of diabetes and family history of heart disease of using a medicine like Celebrex every day. I did refill this temporarily, but if you are persistently needing Celebrex, would recommend you meet with a pain management specialist or your  orthopedist to discuss other options besides NSAIDs or medicine like Celebrex. Tylenol over-the-counter would be okay to try in its place if possible.   No change in blood pressure medication for now, cholesterol medications same dose for now as well. We will check your lab work today, follow-up the next 3 months.  Increase exercise/activity such as walking to help with weight loss.   Before you significantly increase your exercise regimen, would meet with the cardiologist first to help guide you in the appropriate type of exercise.    3 month blood sugar average is unchanged, but I suspect this will improve with your diet changes, and increased activity. We can continue the same dose of metformin for now, but if A1c remains above 7 at follow-up in 3 months, can discuss other medication options at that time.  Your EKG overall looked okay, but there were few possible slight changes from 2013. This can also be discussed at your cardiologist appointment.If any recurrence of chest pain, return here or the emergency room.  See list of foods below to avoid with heartburn, and use only the amount of Prevacid you need. You can try every other day, or up to once per day.  Food Choices for Gastroesophageal Reflux Disease, Adult When you have gastroesophageal reflux disease (GERD), the foods you eat and your eating habits are very important. Choosing the right foods can help ease the discomfort of GERD. WHAT GENERAL GUIDELINES DO I NEED TO FOLLOW?  Choose fruits, vegetables, whole grains, low-fat dairy products, and low-fat meat, fish, and poultry.  Limit fats such as oils, salad dressings, butter, nuts,  and avocado.  Keep a food diary to identify foods that cause symptoms.  Avoid foods that cause reflux. These may be different for different people.  Eat frequent small meals instead of three large meals each day.  Eat your meals slowly, in a relaxed setting.  Limit fried foods.  Cook foods using  methods other than frying.  Avoid drinking alcohol.  Avoid drinking large amounts of liquids with your meals.  Avoid bending over or lying down until 2-3 hours after eating. WHAT FOODS ARE NOT RECOMMENDED? The following are some foods and drinks that may worsen your symptoms: Vegetables Tomatoes. Tomato juice. Tomato and spaghetti sauce. Chili peppers. Onion and garlic. Horseradish. Fruits Oranges, grapefruit, and lemon (fruit and juice). Meats High-fat meats, fish, and poultry. This includes hot dogs, ribs, ham, sausage, salami, and bacon. Dairy Whole milk and chocolate milk. Sour cream. Cream. Butter. Ice cream. Cream cheese.  Beverages Coffee and tea, with or without caffeine. Carbonated beverages or energy drinks. Condiments Hot sauce. Barbecue sauce.  Sweets/Desserts Chocolate and cocoa. Donuts. Peppermint and spearmint. Fats and Oils High-fat foods, including [place] fries and potato chips. Other Vinegar. Strong spices, such as black pepper, white pepper, red pepper, cayenne, curry powder, cloves, ginger, and chili powder. The items listed above may not be a complete list of foods and beverages to avoid. Contact your dietitian for more information.   This information is not intended to replace advice given to you by your health care provider. Make sure you discuss any questions you have with your health care provider.   Document Released: 05/10/2005 Document Revised: 05/31/2014 Document Reviewed: 03/14/2013 Elsevier Interactive Patient Education [employer].

## 2015-11-17 MED FILL — GABAPENTIN 300 MG CAPSULE: 300 | 30 days supply | Qty: 90 | Fill #1

## 2015-11-26 ENCOUNTER — Other Ambulatory Visit: Payer: Self-pay | Admitting: Family Medicine

## 2015-11-26 DIAGNOSIS — E876 Hypokalemia: Secondary | ICD-10-CM

## 2015-11-27 MED FILL — METOPROLOL SUCC ER 100 MG T: 100 | 90 days supply | Qty: 90 | Fill #1

## 2015-11-27 MED FILL — PRAVASTATIN SODIUM 80 MG TA: 80 | 90 days supply | Qty: 90 | Fill #1

## 2015-11-27 MED FILL — AMLODIPINE BESYLATE 10 MG T: 10 | 90 days supply | Qty: 90 | Fill #1

## 2015-11-28 ENCOUNTER — Telehealth: Payer: Self-pay

## 2015-11-28 NOTE — Telephone Encounter (Signed)
-----   Message from Wendie Agreste, MD sent at 11/26/2015 11:52 AM EDT ----- Call patient Cholesterol, electrolytes, kidney, liver tests overall okay. Potassium was borderline low at 3.3. Recommend banana or other potassium-rich food each day, and recheck levels with a lab only visit in the next 2 weeks. Let me know if any questions in the meantime.

## 2015-12-04 MED FILL — LANSOPRAZOLE DR 30 MG CAP: 30 | 90 days supply | Qty: 90 | Fill #0

## 2015-12-22 MED FILL — GABAPENTIN 300 MG CAPSULE: 300 | 30 days supply | Qty: 90 | Fill #2

## 2015-12-23 MED FILL — CELECOXIB 200 MG CAPSULE: 200 | 30 days supply | Qty: 30 | Fill #1

## 2016-01-20 MED FILL — CELECOXIB 200 MG CAPSULE: 200 | 30 days supply | Qty: 30 | Fill #2

## 2016-02-12 MED FILL — LOSARTAN-HCTZ 100-25 MG TAB: 100-25 | 90 days supply | Qty: 90 | Fill #1

## 2016-02-12 MED FILL — metFORMIN HCL 1000 MG TABS: 1000 | 90 days supply | Qty: 180 | Fill #1

## 2016-02-23 MED FILL — GABAPENTIN 300 MG CAPSULE: 300 | 30 days supply | Qty: 90 | Fill #0

## 2016-02-25 MED FILL — CELECOXIB 200 MG CAPSULE: 200 | 30 days supply | Qty: 30 | Fill #0

## 2016-03-02 MED FILL — AMLODIPINE BESYLATE 10 MG T: 10 | 30 days supply | Qty: 30 | Fill #0

## 2016-03-02 MED FILL — POLY-IRON 150 MG CAPSULE: 150 | 30 days supply | Qty: 30 | Fill #0

## 2016-03-02 MED FILL — METOPROLOL SUCC ER 100 MG T: 100 | 30 days supply | Qty: 30 | Fill #0

## 2016-03-02 MED FILL — PRAVASTATIN SODIUM 80 MG TA: 80 | 30 days supply | Qty: 30 | Fill #0

## 2016-03-08 MED FILL — LANSOPRAZOLE DR 30 MG CAP: 30 | 30 days supply | Qty: 30 | Fill #1

## 2016-03-30 ENCOUNTER — Telehealth: Payer: Self-pay | Admitting: Family Medicine

## 2016-03-30 MED FILL — CELECOXIB 200 MG CAPSULE: 200 | 30 days supply | Qty: 30 | Fill #1

## 2016-03-30 MED FILL — GABAPENTIN 300 MG CAPSULE: 300 | 30 days supply | Qty: 90 | Fill #1

## 2016-03-30 NOTE — Telephone Encounter (Signed)
Pt wanted copy of last two visits I gave her her copies plus she signed a release form to send to Dr Nelva Bush office I gave her copies to give to Dr

## 2016-03-31 MED FILL — PRAVASTATIN SODIUM 80 MG TA: 80 | 30 days supply | Qty: 30 | Fill #1

## 2016-03-31 MED FILL — AMLODIPINE BESYLATE 10 MG T: 10 | 90 days supply | Qty: 90 | Fill #1

## 2016-03-31 MED FILL — METOPROLOL SUCC ER 100 MG T: 100 | 30 days supply | Qty: 30 | Fill #1

## 2016-04-09 MED FILL — POLY-IRON 150 MG CAPSULE: 150 | 30 days supply | Qty: 30 | Fill #1

## 2016-04-09 MED FILL — LANSOPRAZOLE DR 30 MG CAP: 30 | 30 days supply | Qty: 30 | Fill #2

## 2016-05-03 MED FILL — CELECOXIB 200 MG CAPSULE: 200 | 30 days supply | Qty: 30 | Fill #0

## 2016-05-06 MED FILL — POLY-IRON 150 MG CAPSULE: 150 | 30 days supply | Qty: 30 | Fill #2

## 2016-05-06 MED FILL — METOPROLOL SUCC ER 100 MG T: 100 | 30 days supply | Qty: 30 | Fill #2

## 2016-05-06 MED FILL — PRAVASTATIN SODIUM 80 MG TA: 80 | 30 days supply | Qty: 30 | Fill #2

## 2016-05-13 ENCOUNTER — Other Ambulatory Visit: Payer: Self-pay | Admitting: Family Medicine

## 2016-05-13 DIAGNOSIS — I10 Essential (primary) hypertension: Secondary | ICD-10-CM

## 2016-05-14 MED FILL — LOSARTAN-HCTZ 100-25 MG TAB: 100-25 | 90 days supply | Qty: 90 | Fill #0

## 2016-05-19 ENCOUNTER — Ambulatory Visit (INDEPENDENT_AMBULATORY_CARE_PROVIDER_SITE_OTHER): Payer: BLUE CROSS/BLUE SHIELD | Admitting: Urgent Care

## 2016-05-19 VITALS — BP 126/80 | HR 89 | Temp 98.5°F | Resp 18 | Ht <= 58 in | Wt 217.0 lb

## 2016-05-19 DIAGNOSIS — M47816 Spondylosis without myelopathy or radiculopathy, lumbar region: Secondary | ICD-10-CM

## 2016-05-19 DIAGNOSIS — K219 Gastro-esophageal reflux disease without esophagitis: Secondary | ICD-10-CM | POA: Diagnosis not present

## 2016-05-19 DIAGNOSIS — G8929 Other chronic pain: Secondary | ICD-10-CM | POA: Diagnosis not present

## 2016-05-19 DIAGNOSIS — I10 Essential (primary) hypertension: Secondary | ICD-10-CM

## 2016-05-19 DIAGNOSIS — M545 Low back pain, unspecified: Secondary | ICD-10-CM

## 2016-05-19 DIAGNOSIS — E785 Hyperlipidemia, unspecified: Secondary | ICD-10-CM | POA: Diagnosis not present

## 2016-05-19 DIAGNOSIS — E119 Type 2 diabetes mellitus without complications: Secondary | ICD-10-CM | POA: Diagnosis not present

## 2016-05-19 DIAGNOSIS — M1612 Unilateral primary osteoarthritis, left hip: Secondary | ICD-10-CM

## 2016-05-19 DIAGNOSIS — Z23 Encounter for immunization: Secondary | ICD-10-CM | POA: Diagnosis not present

## 2016-05-19 MED ORDER — LANSOPRAZOLE 30 MG PO CPDR: DELAYED_RELEASE_CAPSULE | ORAL | 1 refills | Status: DC

## 2016-05-19 MED ORDER — LOSARTAN POTASSIUM-HCTZ 100-25 MG PO TABS: 1.0000 | ORAL_TABLET | Freq: Every day | ORAL | 1 refills | Status: DC

## 2016-05-19 MED ORDER — METFORMIN HCL 1000 MG PO TABS: 1000.0000 mg | ORAL_TABLET | Freq: Two times a day (BID) | ORAL | 1 refills | Status: DC

## 2016-05-19 MED ORDER — CYCLOBENZAPRINE HCL 5 MG PO TABS: 5.0000 mg | ORAL_TABLET | Freq: Three times a day (TID) | ORAL | 5 refills | Status: DC | PRN

## 2016-05-19 MED ORDER — AMLODIPINE BESYLATE 10 MG PO TABS: 10.0000 mg | ORAL_TABLET | Freq: Every day | ORAL | 1 refills | Status: DC

## 2016-05-19 MED ORDER — PRAVASTATIN SODIUM 80 MG PO TABS: 80.0000 mg | ORAL_TABLET | Freq: Every day | ORAL | 1 refills | Status: DC

## 2016-05-19 MED ORDER — METOPROLOL SUCCINATE ER 100 MG PO TB24: ORAL_TABLET | ORAL | 1 refills | Status: DC

## 2016-05-19 MED ORDER — CELECOXIB 200 MG PO CAPS: ORAL_CAPSULE | ORAL | 5 refills | Status: DC

## 2016-05-19 MED FILL — LANSOPRAZOLE DR 30 MG CAP: 30 | 30 days supply | Qty: 30 | Fill #0

## 2016-05-19 MED FILL — CYCLOBENZAPRINE 5 MG TABLET: 5 | 20 days supply | Qty: 60 | Fill #0

## 2016-05-19 MED FILL — metFORMIN HCL 1000 MG TABS: 1000 | 90 days supply | Qty: 180 | Fill #0

## 2016-05-19 NOTE — Progress Notes (Signed)
MRN: [id number] DOB: [birth year]  Subjective:   DESHANNA HELLSTROM is a 60 y.o. female presenting for chief complaint of Medication Refill (ALL MEDS EXCEPT ALBUTEROL)  DM - Managed well with Metformin 1,000mg  BID. Does not check blood sugar at home. Does not exercise. She knows she has to lose weight and plans on starting exercise regimen soon. Patient denies blurred vision, polydipsia, chest pain, nausea, vomiting, abdominal pain, hematuria, polyuria, skin infections, numbness or tingling. Denies smoking cigarettes or drinking alcohol. Has not had an eye exam in 2 years. She would like to set up her own appointment. Flu vaccine updated today.   HTN - Managed well with amlodipine, los-HCTZ. ROS as above.   HL - Managed well with pravastatin.   Arthritis - Managed with Celecoxib. Has f/u appointment with Dr. Nelva Bush in February. She would like a refill as well as another medication to supplement when she has arthritis "flares".  GERD - Managed well with lansoprazole. Needs refills.  [name] has a current medication list which includes the following prescription(s): albuterol, amlodipine, celecoxib, iron polysaccharides, lansoprazole, losartan-hydrochlorothiazide, metformin, metoprolol succinate, and pravastatin. Also is allergic to sulfa antibiotics.  [name]  has a past medical history of Anemia; Arthritis; DM type 2 (diabetes mellitus, type 2) ([place]); Heart murmur; HTN (hypertension); Hypercholesterolemia; and Reflux. Also  has a past surgical history that includes Cesarean section and Tubal ligation.  Objective:   Vitals: BP 126/80 (BP Location: Right Arm, Patient Position: Sitting, Cuff Size: Large)   Pulse 89   Temp 98.5 F (36.9 C) (Oral)   Resp 18   Ht 4\' 10"  (1.473 m)   Wt 217 lb (98.4 kg)   SpO2 98%   BMI 45.35 kg/m   Physical Exam  Constitutional: She is oriented to person, place, and time. She appears well-developed and well-nourished.  Body habitus is morbidly obese.    HENT:  Mouth/Throat: Oropharynx is clear and moist.  Eyes: No scleral icterus.  Cardiovascular: Normal rate, regular rhythm and intact distal pulses.  Exam reveals no gallop and no friction rub.   No murmur heard. Pulmonary/Chest: No respiratory distress. She has no wheezes. She has no rales.  Abdominal: Soft. Bowel sounds are normal. She exhibits no distension and no mass. There is no tenderness. There is no guarding.  Musculoskeletal: She exhibits no edema.  Neurological: She is alert and oriented to person, place, and time.  Skin: Skin is warm and dry.   Diabetic Foot Form - Detailed   Diabetic Foot Exam - detailed Diabetic Foot exam was performed with the following findings:  Yes 05/19/2016 10:48 AM  Visual Foot Exam completed.:  Yes  Is there a history of foot ulcer?:  No Can the patient see the bottom of their feet?:  Yes Are the shoes appropriate in style and fit?:  Yes Is there swelling or and abnormal foot shape?:  No Are the toenails long?:  No Are the toenails thick?:  Yes (Comment: left Great toe) Do you have pain in calf while walking?:  No Is there a claw toe deformity?:  No Is there elevated skin temparature?:  No Is there limited skin dorsiflexion?:  No Is there foot or ankle muscle weakness?:  No Are the toenails ingrown?:  Yes (Comment: right Great toe) Normal Range of Motion:  Yes Pulse Foot Exam completed.:  Yes  Sensory Foot Exam Completed.:  Yes Swelling:  No Semmes-Weinstein Monofilament Test R Foot Test Control:  Pos L Foot Test Control:  Pos  R Site 1-Great Toe:  Pos L Site 1-Great Toe:  Pos  R Site 4:  Pos L Site 4:  Pos  R Site 5:  Pos L Site 5:  Pos        Assessment and Plan :   1. Type 2 diabetes mellitus without complication, without long-term current use of insulin ([place]) 2. Essential hypertension 3. Hyperlipidemia, unspecified hyperlipidemia type 4. Morbid obesity ([place]) - Well controlled, labs pending, medications refilled.  - Encouraged  dietary modifications and weight loss.  5. Chronic bilateral low back pain without sciatica 6. Spondylosis of lumbar region without myelopathy or radiculopathy 7. Osteoarthritis of left hip, unspecified osteoarthritis type - Refilled celecoxib, add Flexeril. Recheck prior to February if Flexeril does not help.  8. Gastroesophageal reflux disease without esophagitis - lansoprazole (PREVACID) 30 MG capsule; TAKE 1 CAPSULE BY MOUTH DAILY.  Dispense: 90 capsule; Refill: 1  9. Need for prophylactic vaccination and inoculation against influenza - Flu Vaccine QUAD 36+ mos IM   [name], PA-C [facility] [phone] 05/19/2016 10:38 AM

## 2016-05-19 NOTE — Patient Instructions (Addendum)
Diabetes Mellitus and Food It is important for you to manage your blood sugar (glucose) level. Your blood glucose level can be greatly affected by what you eat. Eating healthier foods in the appropriate amounts throughout the day at about the same time each day will help you control your blood glucose level. It can also help slow or prevent worsening of your diabetes mellitus. Healthy eating may even help you improve the level of your blood pressure and reach or maintain a healthy weight. General recommendations for healthful eating and cooking habits include:  Eating meals and snacks regularly. Avoid going long periods of time without eating to lose weight.  Eating a diet that consists mainly of plant-based foods, such as fruits, vegetables, nuts, legumes, and whole grains.  Using low-heat cooking methods, such as baking, instead of high-heat cooking methods, such as deep frying.  Work with your dietitian to make sure you understand how to use the Nutrition Facts information on food labels. How can food affect me? Carbohydrates Carbohydrates affect your blood glucose level more than any other type of food. Your dietitian will help you determine how many carbohydrates to eat at each meal and teach you how to count carbohydrates. Counting carbohydrates is important to keep your blood glucose at a healthy level, especially if you are using insulin or taking certain medicines for diabetes mellitus. Alcohol Alcohol can cause sudden decreases in blood glucose (hypoglycemia), especially if you use insulin or take certain medicines for diabetes mellitus. Hypoglycemia can be a life-threatening condition. Symptoms of hypoglycemia (sleepiness, dizziness, and disorientation) are similar to symptoms of having too much alcohol. If your health care provider has given you approval to drink alcohol, do so in moderation and use the following guidelines:  Women should not have more than one drink per day, and men  should not have more than two drinks per day. One drink is equal to: ? 12 oz of beer. ? 5 oz of wine. ? 1 oz of hard liquor.  Do not drink on an empty stomach.  Keep yourself hydrated. Have water, diet soda, or unsweetened iced tea.  Regular soda, juice, and other mixers might contain a lot of carbohydrates and should be counted.  What foods are not recommended? As you make food choices, it is important to remember that all foods are not the same. Some foods have fewer nutrients per serving than other foods, even though they might have the same number of calories or carbohydrates. It is difficult to get your body what it needs when you eat foods with fewer nutrients. Examples of foods that you should avoid that are high in calories and carbohydrates but low in nutrients include:  Trans fats (most processed foods list trans fats on the Nutrition Facts label).  Regular soda.  Juice.  Candy.  Sweets, such as cake, pie, doughnuts, and cookies.  Fried foods.  What foods can I eat? Eat nutrient-rich foods, which will nourish your body and keep you healthy. The food you should eat also will depend on several factors, including:  The calories you need.  The medicines you take.  Your weight.  Your blood glucose level.  Your blood pressure level.  Your cholesterol level.  You should eat a variety of foods, including:  Protein. ? Lean cuts of meat. ? Proteins low in saturated fats, such as fish, egg whites, and beans. Avoid processed meats.  Fruits and vegetables. ? Fruits and vegetables that may help control blood glucose levels, such as apples,   yams.  Dairy products.  Choose fat-free or low-fat dairy products, such as milk, yogurt, and cheese.  Grains, bread, pasta, and rice.  Choose whole grain products, such as multigrain bread, whole oats, and brown rice. These foods may help control blood pressure.  Fats.  Foods containing healthful fats, such as nuts,  avocado, olive oil, canola oil, and fish. Does everyone with diabetes mellitus have the same meal plan? Because every person with diabetes mellitus is different, there is not one meal plan that works for everyone. It is very important that you meet with a dietitian who will help you create a meal plan that is just right for you. This information is not intended to replace advice given to you by your health care provider. Make sure you discuss any questions you have with your health care provider. Document Released: 02/04/2005 Document Revised: 10/16/2015 Document Reviewed: 04/06/2013 Elsevier Interactive Patient Education  2017 Elsevier Inc.     Hypertension Hypertension, commonly called high blood pressure, is when the force of blood pumping through your arteries is too strong. Your arteries are the blood vessels that carry blood from your heart throughout your body. A blood pressure reading consists of a higher number over a lower number, such as 110/72. The higher number (systolic) is the pressure inside your arteries when your heart pumps. The lower number (diastolic) is the pressure inside your arteries when your heart relaxes. Ideally you want your blood pressure below 120/80. Hypertension forces your heart to work harder to pump blood. Your arteries may become narrow or stiff. Having untreated or uncontrolled hypertension can cause heart attack, stroke, kidney disease, and other problems. What increases the risk? Some risk factors for high blood pressure are controllable. Others are not. Risk factors you cannot control include:  Race. You may be at higher risk if you are African American.  Age. Risk increases with age.  Gender. Men are at higher risk than women before age 26 years. After age 43, women are at higher risk than men. Risk factors you can control include:  Not getting enough exercise or physical activity.  Being overweight.  Getting too much fat, sugar, calories, or  salt in your diet.  Drinking too much alcohol. What are the signs or symptoms? Hypertension does not usually cause signs or symptoms. Extremely high blood pressure (hypertensive crisis) may cause headache, anxiety, shortness of breath, and nosebleed. How is this diagnosed? To check if you have hypertension, your health care provider will measure your blood pressure while you are seated, with your arm held at the level of your heart. It should be measured at least twice using the same arm. Certain conditions can cause a difference in blood pressure between your right and left arms. A blood pressure reading that is higher than normal on one occasion does not mean that you need treatment. If it is not clear whether you have high blood pressure, you may be asked to return on a different day to have your blood pressure checked again. Or, you may be asked to monitor your blood pressure at home for 1 or more weeks. How is this treated? Treating high blood pressure includes making lifestyle changes and possibly taking medicine. Living a healthy lifestyle can help lower high blood pressure. You may need to change some of your habits. Lifestyle changes may include:  Following the DASH diet. This diet is high in fruits, vegetables, and whole grains. It is low in salt, red meat, and added sugars.  Keep  your sodium intake below 2,300 mg per day.  Getting at least 30-45 minutes of aerobic exercise at least 4 times per week.  Losing weight if necessary.  Not smoking.  Limiting alcoholic beverages.  Learning ways to reduce stress. Your health care provider may prescribe medicine if lifestyle changes are not enough to get your blood pressure under control, and if one of the following is true:  You are 66-68 years of age and your systolic blood pressure is above 140.  You are 51 years of age or older, and your systolic blood pressure is above 150.  Your diastolic blood pressure is above 90.  You have  diabetes, and your systolic blood pressure is over [id number] or your diastolic blood pressure is over 90.  You have kidney disease and your blood pressure is above 140/90.  You have heart disease and your blood pressure is above 140/90. Your personal target blood pressure may vary depending on your medical conditions, your age, and other factors. Follow these instructions at home:  Have your blood pressure rechecked as directed by your health care provider.  Take medicines only as directed by your health care provider. Follow the directions carefully. Blood pressure medicines must be taken as prescribed. The medicine does not work as well when you skip doses. Skipping doses also puts you at risk for problems.  Do not smoke.  Monitor your blood pressure at home as directed by your health care provider. Contact a health care provider if:  You think you are having a reaction to medicines taken.  You have recurrent headaches or feel dizzy.  You have swelling in your ankles.  You have trouble with your vision. Get help right away if:  You develop a severe headache or confusion.  You have unusual weakness, numbness, or feel faint.  You have severe chest or abdominal pain.  You vomit repeatedly.  You have trouble breathing. This information is not intended to replace advice given to you by your health care provider. Make sure you discuss any questions you have with your health care provider. Document Released: 05/10/2005 Document Revised: 10/16/2015 Document Reviewed: 03/02/2013 Elsevier Interactive Patient Education  2017 [employer].    IF you received an x-ray today, you will receive an invoice from [facility]. Please contact [facility] at [phone] with questions or concerns regarding your invoice.   IF you received labwork today, you will receive an invoice from [place]. Please contact [facility] at [phone] with questions or concerns regarding your  invoice.   Our billing staff will not be able to assist you with questions regarding bills from these companies.  You will be contacted with the lab results as soon as they are available. The fastest way to get your results is to activate your My Chart account. Instructions are located on the last page of this paperwork. If you have not heard from [place] regarding the results in 2 weeks, please contact this office.

## 2016-05-20 LAB — CMP14+EGFR
ALT: 15 IU/L (ref 0–32)
AST: 13 IU/L (ref 0–40)
Albumin/Globulin Ratio: 1.7 (ref 1.2–2.2)
Albumin: 4.9 g/dL (ref 3.5–5.5)
Alkaline Phosphatase: 74 IU/L (ref 39–117)
BUN/Creatinine Ratio: 13 (ref 9–23)
BUN: 11 mg/dL (ref 6–24)
Bilirubin Total: 0.3 mg/dL (ref 0.0–1.2)
CO2: 27 mmol/L (ref 18–29)
Calcium: 10.1 mg/dL (ref 8.7–10.2)
Chloride: 99 mmol/L (ref 96–106)
Creatinine, Ser: 0.85 mg/dL (ref 0.57–1.00)
GFR calc Af Amer: 87 mL/min/{1.73_m2} (ref >59)
GFR calc non Af Amer: 76 mL/min/{1.73_m2} (ref >59)
Globulin, Total: 2.9 g/dL (ref 1.5–4.5)
Glucose: 116 mg/dL — ABNORMAL HIGH (ref 65–99)
Potassium: 3.9 mmol/L (ref 3.5–5.2)
Sodium: 143 mmol/L (ref 134–144)
Total Protein: 7.8 g/dL (ref 6.0–8.5)

## 2016-05-20 LAB — LIPID PANEL
Chol/HDL Ratio: 3.6 ratio units (ref 0.0–4.4)
Cholesterol, Total: 166 mg/dL (ref 100–199)
HDL: 46 mg/dL (ref >39)
LDL Calculated: 86 mg/dL (ref 0–99)
Triglycerides: 172 mg/dL — ABNORMAL HIGH (ref 0–149)
VLDL Cholesterol Cal: 34 mg/dL (ref 5–40)

## 2016-05-20 LAB — HEMOGLOBIN A1C
Est. average glucose Bld gHb Est-mCnc: 166 mg/dL
Hgb A1c MFr Bld: 7.4 % — ABNORMAL HIGH (ref 4.8–5.6)

## 2016-05-20 LAB — MICROALBUMIN, URINE: Microalbumin, Urine: 304.9 ug/mL

## 2016-06-04 MED FILL — METOPROLOL SUCC ER 100 MG T: 100 | 30 days supply | Qty: 30 | Fill #3

## 2016-06-04 MED FILL — PRAVASTATIN SODIUM 80 MG TA: 80 | 30 days supply | Qty: 30 | Fill #3

## 2016-06-07 MED FILL — CELECOXIB 200 MG CAPSULE: 200 | 30 days supply | Qty: 30 | Fill #0

## 2016-06-14 MED FILL — POLY-IRON 150 MG CAPSULE: 150 | 30 days supply | Qty: 30 | Fill #3

## 2016-06-30 MED FILL — AMLODIPINE BESYLATE 10 MG T: 10 | 60 days supply | Qty: 60 | Fill #2

## 2016-06-30 MED FILL — GABAPENTIN 300 MG CAPSULE: 300 | 30 days supply | Qty: 90 | Fill #2

## 2016-06-30 MED FILL — LANSOPRAZOLE DR 30 MG CAP: 30 | 30 days supply | Qty: 30 | Fill #1

## 2016-07-08 ENCOUNTER — Ambulatory Visit: Payer: BLUE CROSS/BLUE SHIELD

## 2016-07-08 MED FILL — METOPROLOL SUCC ER 100 MG T: 100 | 30 days supply | Qty: 30 | Fill #4

## 2016-07-08 MED FILL — PRAVASTATIN SODIUM 80 MG TA: 80 | 30 days supply | Qty: 30 | Fill #4

## 2016-07-08 MED FILL — CELECOXIB 200 MG CAPSULE: 200 | 30 days supply | Qty: 30 | Fill #1

## 2016-07-19 MED FILL — CYCLOBENZAPRINE 5 MG TABLET: 5 | 20 days supply | Qty: 60 | Fill #1

## 2016-07-19 MED FILL — POLY-IRON 150 MG CAPSULE: 150 | 30 days supply | Qty: 30 | Fill #4

## 2016-08-06 MED FILL — METOPROLOL SUCC ER 100 MG T: 100 | 30 days supply | Qty: 30 | Fill #5

## 2016-08-09 MED FILL — PRAVASTATIN SODIUM 80 MG TA: 80 | 30 days supply | Qty: 30 | Fill #5

## 2016-08-09 MED FILL — CELECOXIB 200 MG CAPSULE: 200 | 30 days supply | Qty: 30 | Fill #2

## 2016-08-11 MED FILL — LANSOPRAZOLE DR 30 MG CAP: 30 | 30 days supply | Qty: 30 | Fill #2

## 2016-08-16 MED FILL — LOSARTAN-HCTZ 100-25 MG TAB: 100-25 | 90 days supply | Qty: 90 | Fill #0

## 2016-08-24 ENCOUNTER — Ambulatory Visit (INDEPENDENT_AMBULATORY_CARE_PROVIDER_SITE_OTHER): Payer: BLUE CROSS/BLUE SHIELD | Admitting: Urgent Care

## 2016-08-24 ENCOUNTER — Ambulatory Visit (HOSPITAL_COMMUNITY)
Admission: RE | Admit: 2016-08-24 | Discharge: 2016-08-24 | Disposition: A | Discharge status: Home / Self Care | Payer: BLUE CROSS/BLUE SHIELD | Source: Ambulatory Visit | Attending: Urgent Care | Admitting: Urgent Care

## 2016-08-24 VITALS — BP 145/82 | HR 85 | Temp 98.6°F | Resp 18 | Ht <= 58 in | Wt 203.0 lb

## 2016-08-24 DIAGNOSIS — M79661 Pain in right lower leg: Secondary | ICD-10-CM

## 2016-08-24 DIAGNOSIS — M79604 Pain in right leg: Secondary | ICD-10-CM | POA: Diagnosis not present

## 2016-08-24 DIAGNOSIS — E119 Type 2 diabetes mellitus without complications: Secondary | ICD-10-CM

## 2016-08-24 LAB — POCT URINALYSIS DIP (MANUAL ENTRY)
Bilirubin, UA: NEGATIVE
Blood, UA: NEGATIVE
Glucose, UA: NEGATIVE
Ketones, POC UA: NEGATIVE
Leukocytes, UA: NEGATIVE
Nitrite, UA: POSITIVE — AB
Protein Ur, POC: 100 — AB
Spec Grav, UA: 1.025 (ref 1.030–1.035)
Urobilinogen, UA: 0.2 (ref ?–2.0)
pH, UA: 5.5 (ref 5.0–8.0)

## 2016-08-24 LAB — POCT GLYCOSYLATED HEMOGLOBIN (HGB A1C): Hemoglobin A1C: 7.1

## 2016-08-24 MED FILL — POLY-IRON 150 MG CAPSULE: 150 | 30 days supply | Qty: 30 | Fill #5

## 2016-08-24 NOTE — Progress Notes (Signed)
**  Preliminary report by tech**  Right lower extremity venous duplex complete. There is no evidence of deep or superficial vein thrombosis involving the right lower extremity. All visualized vessels appear patent and compressible. There is no evidence of a Baker's cyst on the right. Results were given to [facility].  08/24/16 5:05 PM [name] RVT

## 2016-08-24 NOTE — Progress Notes (Signed)
  MRN: [id number] DOB: 12/23/1955  Subjective:   [name] is a 61 y.o. female presenting for chief complaint of Leg Pain (Right leg ) and Medication Refill (All except inhaler)  Reports 2-3 month history of intermittent right thigh, calf cramping and pain. Her symptoms started out as she was starting to exercise. Patient's right leg would fatigue easily and cramp. She has month been able to exercise as a result and still has these cramping sensations. Has also had tingling sensations of her right leg. She is most concerned about having a blood clot. Patient drinks about 32-48 ounces of water daily. Has used Celebrex and Flexeril, scripts she had from the past, without much relief. Denies trauma, redness, calf swelling, varicose veins, long-distance travel, recent surgeries. Denies smoking cigarettes.  [name] has a current medication list which includes the following prescription(s): albuterol, amlodipine, celecoxib, cyclobenzaprine, iron polysaccharides, lansoprazole, losartan-hydrochlorothiazide, metformin, metoprolol succinate, and pravastatin. Also is allergic to sulfa antibiotics.  [name]  has a past medical history of Anemia; Arthritis; DM type 2 (diabetes mellitus, type 2) ([place]); Heart murmur; HTN (hypertension); Hypercholesterolemia; and Reflux. Also  has a past surgical history that includes Cesarean section and Tubal ligation.  Objective:   Vitals: BP (!) 145/82   Pulse 85   Temp 98.6 F (37 C) (Oral)   Resp 18   Ht 4\' 10"  (1.473 m)   Wt 203 lb (92.1 kg)   SpO2 97%   BMI 42.43 kg/m   Physical Exam  Constitutional: She is oriented to person, place, and time. She appears well-developed and well-nourished.  Cardiovascular: Normal rate.   Pulmonary/Chest: Effort normal.  Musculoskeletal:       Right lower leg: She exhibits tenderness (right lateral calf). She exhibits no bony tenderness, no swelling, no edema, no deformity and no laceration.       Legs: Neurological:  She is alert and oriented to person, place, and time. She displays normal reflexes. Coordination normal.  Soft and sharp sensations intact for L2-S1 for both legs.  Skin: Skin is warm and dry. Capillary refill takes less than 2 seconds. No erythema.  Psychiatric: She has a normal mood and affect.   Results for orders placed or performed in visit on 08/24/16 (from the past 24 hour(s))  POCT urinalysis dipstick     Status: Abnormal   Collection Time: 08/24/16 12:13 PM  Result Value Ref Range   Color, UA yellow yellow   Clarity, UA clear clear   Glucose, UA negative negative   Bilirubin, UA negative negative   Ketones, POC UA negative negative   Spec Grav, UA 1.025 1.030 - 1.035   Blood, UA negative negative   pH, UA 5.5 5.0 - 8.0   Protein Ur, POC =100 (A) negative   Urobilinogen, UA 0.2 Negative - 2.0   Nitrite, UA Positive (A) Negative   Leukocytes, UA Negative Negative  POCT glycosylated hemoglobin (Hb A1C)     Status: None   Collection Time: 08/24/16 12:24 PM  Result Value Ref Range   Hemoglobin A1C 7.1    Assessment and Plan :   1. Right leg pain 2. Right calf pain - Will pursue U/S. Patient is to hydrate aggressively. Labs pending.   3. Controlled type 2 diabetes mellitus without complication, without long-term current use of insulin ([place]) - Stable  [name], PA-C [facility] [phone] 08/24/2016  11:38 AM

## 2016-08-24 NOTE — Patient Instructions (Addendum)
[place] PM TODAY 08/24/16    Muscle Cramps and Spasms Muscle cramps and spasms occur when a muscle or muscles tighten and you have no control over this tightening (involuntary muscle contraction). They are a common problem and can develop in any muscle. The most common place is in the calf muscles of the leg. Muscle cramps and muscle spasms are both involuntary muscle contractions, but there are some differences between the two:  Muscle cramps are painful. They come and go and may last a few seconds to 15 minutes. Muscle cramps are often more forceful and last longer than muscle spasms.  Muscle spasms may or may not be painful. They may also last just a few seconds or much longer. Certain medical conditions, such as diabetes or Parkinson disease, can make it more likely to develop cramps or spasms. However, cramps or spasms are usually not caused by a serious underlying problem. Common causes include:  Overexertion.  Overuse from repetitive motions, or doing the same thing over and over.  Remaining in a certain position for a long period of time.  Improper preparation, form, or technique while playing a sport or doing an activity.  Dehydration.  Injury.  Side effects of some medicines.  Abnormally low levels of the salts and ions in your blood (electrolytes), especially potassium and calcium. This could happen if you are taking water pills (diuretics) or if you are pregnant. In many cases, the cause of muscle cramps or spasms is unknown. Follow these instructions at home:  Stay well hydrated. Drink enough fluid to keep your urine clear or pale yellow.  Try massaging, stretching, and relaxing the affected muscle.  If directed, apply heat to tight or tense muscles as often as told by your health care provider. Use the heat source that your health care provider recommends, such as a moist heat pack or a heating pad.  Place a towel between your skin and the heat  source.  Leave the heat on for 20-30 minutes.  Remove the heat if your skin turns bright red. This is especially important if you are unable to feel pain, heat, or cold. You may have a greater risk of getting burned.  If directed, put ice on the affected area. This may help if you are sore or have pain after a cramp or spasm.  Put ice in a plastic bag.  Place a towel between your skin and the bag.  Leavethe ice on for 20 minutes, 2-3 times a day.  Take over-the-counter and prescription medicines only as told by your health care provider.  Pay attention to any changes in your symptoms. Contact a health care provider if:  Your cramps or spasms get more severe or happen more often.  Your cramps or spasms do not improve over time. This information is not intended to replace advice given to you by your health care provider. Make sure you discuss any questions you have with your health care provider. Document Released: 10/30/2001 Document Revised: 06/11/2015 Document Reviewed: 02/11/2015 Elsevier Interactive Patient Education  2017 [employer].  IF you received an x-ray today, you will receive an invoice from [facility]. Please contact [facility] at [phone] with questions or concerns regarding your invoice.   IF you received labwork today, you will receive an invoice from [place]. Please contact [facility] at [phone] with questions or concerns regarding your invoice.   Our billing staff will not be able to assist you with questions regarding bills from  these companies.  You will be contacted with the lab results as soon as they are available. The fastest way to get your results is to activate your My Chart account. Instructions are located on the last page of this paperwork. If you have not heard from [place] regarding the results in 2 weeks, please contact this office.

## 2016-08-25 LAB — CBC
Hematocrit: 37.4 % (ref 34.0–46.6)
Hemoglobin: 11.6 g/dL (ref 11.1–15.9)
MCH: 23.5 pg — ABNORMAL LOW (ref 26.6–33.0)
MCHC: 31 g/dL — ABNORMAL LOW (ref 31.5–35.7)
MCV: 76 fL — ABNORMAL LOW (ref 79–97)
Platelets: 350 10*3/uL (ref 150–379)
RBC: 4.93 x10E6/uL (ref 3.77–5.28)
RDW: 17.4 % — ABNORMAL HIGH (ref 12.3–15.4)
WBC: 7.9 10*3/uL (ref 3.4–10.8)

## 2016-08-25 LAB — SEDIMENTATION RATE: Sed Rate: 28 mm/hr (ref 0–40)

## 2016-08-25 LAB — CMP14+EGFR
ALT: 15 IU/L (ref 0–32)
AST: 14 IU/L (ref 0–40)
Albumin/Globulin Ratio: 1.6 (ref 1.2–2.2)
Albumin: 4.9 g/dL (ref 3.5–5.5)
Alkaline Phosphatase: 74 IU/L (ref 39–117)
BUN/Creatinine Ratio: 19 (ref 9–23)
BUN: 15 mg/dL (ref 6–24)
Bilirubin Total: 0.3 mg/dL (ref 0.0–1.2)
CO2: 26 mmol/L (ref 18–29)
Calcium: 10.3 mg/dL — ABNORMAL HIGH (ref 8.7–10.2)
Chloride: 97 mmol/L (ref 96–106)
Creatinine, Ser: 0.79 mg/dL (ref 0.57–1.00)
GFR calc Af Amer: 95 mL/min/{1.73_m2} (ref >59)
GFR calc non Af Amer: 82 mL/min/{1.73_m2} (ref >59)
Globulin, Total: 3 g/dL (ref 1.5–4.5)
Glucose: 108 mg/dL — ABNORMAL HIGH (ref 65–99)
Potassium: 3.8 mmol/L (ref 3.5–5.2)
Sodium: 142 mmol/L (ref 134–144)
Total Protein: 7.9 g/dL (ref 6.0–8.5)

## 2016-08-25 LAB — CK: Total CK: 326 U/L — ABNORMAL HIGH (ref 24–173)

## 2016-08-30 ENCOUNTER — Other Ambulatory Visit: Payer: Self-pay | Admitting: Family Medicine

## 2016-08-30 DIAGNOSIS — I10 Essential (primary) hypertension: Secondary | ICD-10-CM

## 2016-08-30 MED FILL — METOPROLOL SUCC ER 100 MG T: 100 | 30 days supply | Qty: 30 | Fill #0

## 2016-08-30 MED FILL — metFORMIN HCL 1000 MG TABS: 1000 | 90 days supply | Qty: 180 | Fill #1

## 2016-08-30 MED FILL — AMLODIPINE BESYLATE 10 MG T: 10 | 90 days supply | Qty: 90 | Fill #0

## 2016-09-03 ENCOUNTER — Telehealth: Payer: Self-pay | Admitting: General Practice

## 2016-09-03 ENCOUNTER — Ambulatory Visit (INDEPENDENT_AMBULATORY_CARE_PROVIDER_SITE_OTHER): Payer: BLUE CROSS/BLUE SHIELD | Admitting: Family Medicine

## 2016-09-03 VITALS — BP 138/83 | HR 89 | Temp 99.0°F | Resp 16 | Ht <= 58 in | Wt 202.0 lb

## 2016-09-03 DIAGNOSIS — E785 Hyperlipidemia, unspecified: Secondary | ICD-10-CM | POA: Diagnosis not present

## 2016-09-03 DIAGNOSIS — M79604 Pain in right leg: Secondary | ICD-10-CM | POA: Diagnosis not present

## 2016-09-03 NOTE — Patient Instructions (Addendum)
Hold the pravastatin for 2 weeks. If you're much better, return to discuss treatment options for your cholesterol. If you're not certain whether you are better or worse or no change, just go ahead and resume taking the pravastatin and see how you're doing. If you then return, you might be able to try something different like rosuvastatin 2 or 3 days a week.  If you're not any different just go back to taking the pravastatin.  Continue try to get some regular walking type exercise.  In the long run if you will lose weight it would help your leg pains  Continue using the Flexeril (cyclobenzaprine) at bedtime, and you can try increasing it to 10 mg at bedtime.  Return after the trial of coming off of the pravastatin to see Rosario Adie PA   IF you received an x-ray today, you will receive an invoice from [facility]. Please contact [facility] at [phone] with questions or concerns regarding your invoice.   IF you received labwork today, you will receive an invoice from [place]. Please contact [facility] at [phone] with questions or concerns regarding your invoice.   Our billing staff will not be able to assist you with questions regarding bills from these companies.  You will be contacted with the lab results as soon as they are available. The fastest way to get your results is to activate your My Chart account. Instructions are located on the last page of this paperwork. If you have not heard from [place] regarding the results in 2 weeks, please contact this office.

## 2016-09-03 NOTE — Telephone Encounter (Signed)
I donot see uti or meds discussed 09/03/16?

## 2016-09-03 NOTE — Progress Notes (Signed)
Patient ID: [name], female    DOB: 07/02/1955  Age: 61 y.o. MRN: [id number]  Chief Complaint  Patient presents with  . Follow-up    Ultrasound of Right leg last week    Subjective:   Patient is here for follow-up with regard to her leg pains. The right leg continues to hurt her the worst. It hurts her a lot at nighttime especially. She can take Flexeril in the daytime. She does not get a lot of regular exercise but stays busy with caring for a couple foster children. She is on medicines for diabetes, cholesterol, and things. She had the ultrasound which is negative. She has history of chronic back pain ever since old back injury for which she is on disability.    Current allergies, medications, problem list, past/family and social histories reviewed.  Objective:  BP 138/83   Pulse 89   Temp 99 F (37.2 C) (Oral)   Resp 16   Ht 4\' 10"  (1.473 m)   Wt 202 lb (91.6 kg)   SpO2 98%   BMI 42.22 kg/m   Straight leg raising is negative. She's not very tender in the muscles and joints right now, but did use Aspercreme on her legs today. Negative Homans.  Assessment & Plan:   Assessment: 1. Leg pain, diffuse, right   2. Hyperlipidemia, unspecified hyperlipidemia type       Plan: We'll try taking her off the statin and see if that makes a difference. She does have the elevated CPK. If it helps being away from the pravastatin consideration would be taking low dose of Crestor every several days. See instructions.   Patient Instructions   Hold the pravastatin for 2 weeks. If you're much better, return to discuss treatment options for your cholesterol. If you're not certain whether you are better or worse or no change, just go ahead and resume taking the pravastatin and see how you're doing. If you then return, you might be able to try something different like rosuvastatin 2 or 3 days a week.  If you're not any different just go back to taking the pravastatin.  Continue try  to get some regular walking type exercise.  In the long run if you will lose weight it would help your leg pains  Continue using the Flexeril (cyclobenzaprine) at bedtime, and you can try increasing it to 10 mg at bedtime.  Return after the trial of coming off of the pravastatin to see Olivia Adie PA   IF you received an x-ray today, you will receive an invoice from [facility]. Please contact [facility] at [phone] with questions or concerns regarding your invoice.   IF you received labwork today, you will receive an invoice from [place]. Please contact [facility] at [phone] with questions or concerns regarding your invoice.   Our billing staff will not be able to assist you with questions regarding bills from these companies.  You will be contacted with the lab results as soon as they are available. The fastest way to get your results is to activate your My Chart account. Instructions are located on the last page of this paperwork. If you have not heard from [place] regarding the results in 2 weeks, please contact this office.        Return in about 4 weeks (around 10/01/2016).   Melanye Hiraldo, MD 09/03/2016

## 2016-09-03 NOTE — Telephone Encounter (Signed)
Pt is calling back because when she was seen today she and the provider discussed uti symptoms but nothing was called in and she was hoping that it could be   Best number [phone]

## 2016-09-04 ENCOUNTER — Other Ambulatory Visit: Payer: Self-pay | Admitting: Family Medicine

## 2016-09-04 DIAGNOSIS — N3000 Acute cystitis without hematuria: Secondary | ICD-10-CM

## 2016-09-04 MED ORDER — CEPHALEXIN 500 MG PO CAPS: 500.0000 mg | ORAL_CAPSULE | Freq: Three times a day (TID) | ORAL | 0 refills | Status: DC

## 2016-09-04 NOTE — Progress Notes (Signed)
Patient failed to talk to me yesterday when she is in here about her UTI which had gone untreated. She has been having some bladder spasm. She called back. I spoke to on the phone just now and decided to go ahead and treat. Prescribed cephalexin 500 3 times a day for 5 days. If she is not doing better she is to come back for recheck.

## 2016-09-06 MED FILL — CEPHALEXIN 500 MG CAPSULE: 500 | 5 days supply | Qty: 15 | Fill #0

## 2016-09-07 MED FILL — CELECOXIB 200 MG CAP: 200 | 30 days supply | Qty: 30 | Fill #3

## 2016-09-07 MED FILL — LANSOPRAZOLE DR 30 MG CAP: 30 | 30 days supply | Qty: 30 | Fill #3

## 2016-09-15 MED FILL — CYCLOBENZAPRINE 5 MG TABLET: 5 | 20 days supply | Qty: 60 | Fill #2

## 2016-09-16 MED FILL — GABAPENTIN 300 MG CAPSULE: 300 | 30 days supply | Qty: 90 | Fill #0

## 2016-09-22 MED FILL — POLY-IRON 150 MG CAPSULE: 150 | 30 days supply | Qty: 30 | Fill #6

## 2016-10-01 ENCOUNTER — Other Ambulatory Visit: Payer: Self-pay

## 2016-10-01 MED FILL — METOPROLOL SUCC ER 100 MG T: 100 | 30 days supply | Qty: 30 | Fill #1

## 2016-10-01 NOTE — Telephone Encounter (Signed)
PATIENT SAW DR. Linna Darner LAST MONTH (April) FOR A RECHECK ON HER LEG CRAMPS. SHE SAID DR. HOPPER TOLD HER TO STOP TAKING PRAVASTATIN 80 MG TO SEE IF THE CRAMPS STOPPED. SHE SAID THEY DID. SHE ALSO HAD A BLADDER INFECTION AND SHE WAS PRESCRIBED TO HAVE CEPHALEXIN (KEFLEX) 500 MG. SHE TOOK ALL OF IT AND WAS DOING BETTER BUT A WEEK LATER THE SPASMS HAVE RETURNED. BEST PHONE [phone] (CELL) PHARMACY CHOICE IS [facility]. [place]

## 2016-10-04 NOTE — Telephone Encounter (Signed)
Needs sub for pravastatin , since she stopped, leg cramps are gone

## 2016-10-05 ENCOUNTER — Telehealth: Payer: Self-pay | Admitting: Physician Assistant

## 2016-10-05 NOTE — Telephone Encounter (Signed)
Pt was returning Dr. Anell Barr call from earlier today & asked he could please call her back with instructions.   Please Advise

## 2016-10-05 NOTE — Telephone Encounter (Signed)
LMOM to call back.  I need to give her some information before switching her statin. If she is having muscle breakdown with the highest dose of pravstatin in may be reasonable to try half that dose  (40 mg) qd before moving her to crestor which would likely cause same or worse.   Philis Fendt, MS, PA-C 4:12 PM, 10/05/2016

## 2016-10-07 MED FILL — LANSOPRAZOLE DR 30 MG CAP: 30 | 30 days supply | Qty: 30 | Fill #4

## 2016-10-07 MED FILL — CELECOXIB 200 MG CAP: 200 | 30 days supply | Qty: 30 | Fill #4

## 2016-10-07 NOTE — Telephone Encounter (Signed)
She tells me she had myalgia with simvastatin in the past. Patient will try reducing her dose of pravastatin from the maximum of 80 daily to 40 pravastatin every other day.  If no side effect she will go up to the half tab daily and will maintain.  Advised that she come back to the clinic in about 3 months to discuss her result.Philis Fendt, MS, PA-C 2:21 PM, 10/07/2016

## 2016-10-08 NOTE — Telephone Encounter (Signed)
Agree with instructions to decrease dose.  This is just FYI if she doesn't tolerate half dose.  If not tolerated try 5 mg crestor once weekly, then go up to twice a week after 1 month, then 3 times a week the third month.  Stay at that level.

## 2016-10-13 NOTE — Telephone Encounter (Signed)
PATIENT STATES SHE IS TAKING THE PRAVASTATIN 80 MG AND MICHAEL TOLD HER TO GO DOWN TO 40 MG. SHE SAID WHEN SHE TRY TO BREAK THE PILL IN HALF IT CRUSHES INTO A MILLION PIECES BECAUSE IT DOES NOT HAVE A LINE DOWN THE MIDDLE. SHE WOULD JUST LIKE MICHAEL TO CALL HER IN A NEW PRESCRIPTON FOR PRAVASTATIN 40 MG. BEST PHONE [phone] (CELL) PHARMACY CHOICE IS [facility]. [place]

## 2016-10-15 MED ORDER — PRAVASTATIN SODIUM 40 MG PO TABS: ORAL_TABLET | ORAL | 0 refills | Status: DC

## 2016-10-15 MED FILL — PRAVASTATIN NA 40 MG TAB: 40 | 90 days supply | Qty: 75 | Fill #0

## 2016-10-19 MED FILL — CYCLOBENZAPRINE 5 MG TABLET: 5 | 20 days supply | Qty: 60 | Fill #3

## 2016-10-29 MED FILL — POLY-IRON 150 MG CAPSULE: 150 | 30 days supply | Qty: 30 | Fill #7

## 2016-11-02 MED FILL — METOPROLOL SUCC ER 100 MG T: 100 | 30 days supply | Qty: 30 | Fill #2

## 2016-11-11 MED FILL — LOSARTAN-HCTZ 100-25 MG TAB: 100-25 | 90 days supply | Qty: 90 | Fill #1

## 2016-11-11 MED FILL — CELECOXIB 200 MG CAP: 200 | 30 days supply | Qty: 30 | Fill #5

## 2016-11-15 MED FILL — LANSOPRAZOLE DR 30 MG CAP: 30 | 30 days supply | Qty: 30 | Fill #5

## 2016-11-23 MED FILL — CYCLOBENZAPRINE 5 MG TABLET: 5 | 20 days supply | Qty: 60 | Fill #4

## 2016-11-23 MED FILL — AMLODIPINE BESYLATE 10 MG T: 10 | 90 days supply | Qty: 90 | Fill #1

## 2016-11-30 MED FILL — METOPROLOL SUCC ER 100 MG T: 100 | 30 days supply | Qty: 30 | Fill #3

## 2016-12-03 ENCOUNTER — Telehealth: Payer: Self-pay | Admitting: Family Medicine

## 2016-12-03 DIAGNOSIS — D509 Iron deficiency anemia, unspecified: Secondary | ICD-10-CM

## 2016-12-03 NOTE — Telephone Encounter (Signed)
Pt requesting a med refill on iron polysaccharides (NIFEREX) 150 MG.  Contact number [phone]

## 2016-12-03 NOTE — Telephone Encounter (Signed)
Please advise 

## 2016-12-04 MED ORDER — POLYSACCHARIDE IRON COMPLEX 150 MG PO CAPS: 150.0000 mg | ORAL_CAPSULE | Freq: Every day | ORAL | 1 refills | Status: DC

## 2016-12-04 NOTE — Telephone Encounter (Signed)
Refilled. CBC obtained 3 months ago, hemoglobin normal.

## 2016-12-06 MED FILL — POLY-IRON 150 MG CAPSULE: 150 | 30 days supply | Qty: 30 | Fill #0

## 2016-12-13 ENCOUNTER — Other Ambulatory Visit: Payer: Self-pay | Admitting: Urgent Care

## 2016-12-13 DIAGNOSIS — K219 Gastro-esophageal reflux disease without esophagitis: Secondary | ICD-10-CM

## 2016-12-13 DIAGNOSIS — E119 Type 2 diabetes mellitus without complications: Secondary | ICD-10-CM

## 2016-12-13 MED FILL — CELECOXIB 200 MG CAP: 200 | 30 days supply | Qty: 30 | Fill #0

## 2016-12-16 MED FILL — LANSOPRAZOLE DR 30 MG CAP: 30 | 90 days supply | Qty: 90 | Fill #0

## 2016-12-16 MED FILL — metFORMIN HCL 1000 MG TABS: 1000 | 90 days supply | Qty: 180 | Fill #0

## 2016-12-28 MED FILL — METOPROLOL SUCC ER 100 MG T: 100 | 30 days supply | Qty: 30 | Fill #4

## 2016-12-28 MED FILL — CYCLOBENZAPRINE 5 MG TABLET: 5 | 20 days supply | Qty: 60 | Fill #5

## 2017-01-07 MED FILL — POLY-IRON 150 MG CAPSULE: 150 | 30 days supply | Qty: 30 | Fill #1

## 2017-01-17 MED FILL — CELECOXIB 200 MG CAP: 200 | 30 days supply | Qty: 30 | Fill #1

## 2017-01-19 ENCOUNTER — Other Ambulatory Visit: Payer: Self-pay | Admitting: Urgent Care

## 2017-01-19 DIAGNOSIS — M545 Low back pain, unspecified: Secondary | ICD-10-CM

## 2017-01-19 DIAGNOSIS — M47816 Spondylosis without myelopathy or radiculopathy, lumbar region: Secondary | ICD-10-CM

## 2017-01-19 DIAGNOSIS — M1612 Unilateral primary osteoarthritis, left hip: Secondary | ICD-10-CM

## 2017-01-19 DIAGNOSIS — G8929 Other chronic pain: Secondary | ICD-10-CM

## 2017-01-20 MED FILL — CYCLOBENZAPRINE 5 MG TABLET: 5 | 30 days supply | Qty: 90 | Fill #0

## 2017-01-21 ENCOUNTER — Encounter: Payer: Self-pay | Admitting: Family Medicine

## 2017-01-21 ENCOUNTER — Ambulatory Visit (INDEPENDENT_AMBULATORY_CARE_PROVIDER_SITE_OTHER): Payer: BLUE CROSS/BLUE SHIELD | Admitting: Family Medicine

## 2017-01-21 VITALS — BP 127/79 | HR 99 | Temp 99.2°F | Resp 17 | Ht <= 58 in | Wt 205.0 lb

## 2017-01-21 DIAGNOSIS — I10 Essential (primary) hypertension: Secondary | ICD-10-CM | POA: Diagnosis not present

## 2017-01-21 DIAGNOSIS — E7800 Pure hypercholesterolemia, unspecified: Secondary | ICD-10-CM | POA: Diagnosis not present

## 2017-01-21 DIAGNOSIS — R1011 Right upper quadrant pain: Secondary | ICD-10-CM | POA: Diagnosis not present

## 2017-01-21 DIAGNOSIS — E119 Type 2 diabetes mellitus without complications: Secondary | ICD-10-CM | POA: Diagnosis not present

## 2017-01-21 LAB — POCT CBC
Granulocyte percent: 60.2 %G (ref 37–80)
HCT, POC: 38.7 % (ref 37.7–47.9)
Hemoglobin: 12.2 g/dL (ref 12.2–16.2)
Lymph, poc: 2.4 (ref 0.6–3.4)
MCH, POC: 24 pg — AB (ref 27–31.2)
MCHC: 31.6 g/dL — AB (ref 31.8–35.4)
MCV: 76.1 fL — AB (ref 80–97)
MID (cbc): 0.2 (ref 0–0.9)
MPV: 8.1 fL (ref 0–99.8)
POC Granulocyte: 4 (ref 2–6.9)
POC LYMPH PERCENT: 36.6 %L (ref 10–50)
POC MID %: 3.2 %M (ref 0–12)
Platelet Count, POC: 378 10*3/uL (ref 142–424)
RBC: 5.08 M/uL (ref 4.04–5.48)
RDW, POC: 17.7 %
WBC: 6.6 10*3/uL (ref 4.6–10.2)

## 2017-01-21 NOTE — Patient Instructions (Addendum)
I will check liver tests, other electrolytes for your abdominal pain. As long as your blood counts are normal today, can check the ultrasound next week to also look at her gallbladder. For now avoid fried/fatty foods, Tylenol is okay if needed, and if any worsening abdominal pain, fevers, nausea or vomiting, return here or emergency room sooner.  Because you are fasting today, I did check A1c, cholesterol tests, but follow up with [name] within the next few weeks if possible for diabetes and to discuss your other medications.  IF you received an x-ray today, you will receive an invoice from [facility]. Please contact [facility] at [phone] with questions or concerns regarding your invoice.   IF you received labwork today, you will receive an invoice from [place]. Please contact [facility] at [phone] with questions or concerns regarding your invoice.   Our billing staff will not be able to assist you with questions regarding bills from these companies.  You will be contacted with the lab results as soon as they are available. The fastest way to get your results is to activate your My Chart account. Instructions are located on the last page of this paperwork. If you have not heard from [place] regarding the results in 2 weeks, please contact this office.

## 2017-01-21 NOTE — Progress Notes (Signed)
Subjective:  By signing my name below, I, [name], attest that this documentation has been prepared under the direction and in the presence of Wendie Agreste, MD Electronically Signed: Ladene Artist, ED Scribe 01/21/2017 at 11:32 AM.   Patient ID: [name], female    DOB: [birth year], 61 y.o.   MRN: [id number]  Chief Complaint  Patient presents with  . Abdominal Pain   HPI  [name] is a 61 y.o. female who presents to Primary Care at [facility] complaining of abdominal pain. H/o Type 2 DM. Last seen in April for leg pain. Last A1C checked on 08/24/16 when seen by [name], PA-C. Pt is fasting at this visit.  Today she presents with intermittent RUQ abdominal pain x 6 weeks, gradually improving over the past few days. Pt has noticed that pain is worse at night. She has also noticed that pain is exacerbated with deep breathing with laying on her right side. She also reports hot flashes which she has had for a while. Takes Tylenol and Celebrex daily for back and leg pain but has not noticed improvement of abdominal pain. Denies increased pain after eating, fever, nausea, vomiting, constipation, blood in stools, melena, difficulty urinating, chest pain, sob, h/o DVT/PE. No abdominal surgeries other than c-section.   DM Takes Metformin 1000 mg bid.   Patient Active Problem List   Diagnosis Date Noted  . Osteoarthritis of left hip 04/07/2014  . BMI 40.0-44.9, adult ([place]) 04/07/2014  . DM type 2 (diabetes mellitus, type 2) ([place])   . HTN (hypertension)   . Hypercholesterolemia   . Reflux    Past Medical History:  Diagnosis Date  . Anemia   . Arthritis   . DM type 2 (diabetes mellitus, type 2) ([place])   . Heart murmur   . HTN (hypertension)   . Hypercholesterolemia   . Reflux    Past Surgical History:  Procedure Laterality Date  . CESAREAN SECTION     x3  . TUBAL LIGATION     Allergies  Allergen Reactions  . Sulfa Antibiotics Hives   Prior to Admission  medications   Medication Sig Start Date End Date Taking? Authorizing Provider  albuterol (PROAIR HFA) 108 (90 BASE) MCG/ACT inhaler Inhale 2 puffs into the lungs every 6 (six) hours as needed. 07/21/13   Roselee Culver, MD  amLODipine (NORVASC) 10 MG tablet Take 1 tablet (10 mg total) by mouth daily. 05/19/16   [name], PA-C  celecoxib (CELEBREX) 200 MG capsule Take one capsule by mouth daily as needed for pain. 05/19/16   [name], PA-C  cephALEXin (KEFLEX) 500 MG capsule Take 1 capsule (500 mg total) by mouth 3 (three) times daily. 09/04/16   Posey Boyer, MD  cyclobenzaprine (FLEXERIL) 5 MG tablet TAKE 1 TABLET BY MOUTH 3 TIMES DAILY AS NEEDED. 01/20/17   [name], PA-C  iron polysaccharides (NIFEREX) 150 MG capsule Take 1 capsule (150 mg total) by mouth daily. 12/04/16   Wendie Agreste, MD  lansoprazole (PREVACID) 30 MG capsule TAKE 1 CAPSULE BY MOUTH DAILY. 12/16/16   [name], PA-C  losartan-hydrochlorothiazide (HYZAAR) 100-25 MG tablet Take 1 tablet by mouth daily. 05/19/16   [name], PA-C  metFORMIN (GLUCOPHAGE) 1000 MG tablet TAKE 1 TABLET (1,000 MG TOTAL) BY MOUTH 2 (TWO) TIMES DAILY WITH A MEAL. 12/16/16   [name], PA-C  metoprolol succinate (TOPROL-XL) 100 MG 24 hr tablet TAKE 1 TABLET (100 MG TOTAL) BY MOUTH DAILY. TAKE  WITH OR IMMEDIATELY FOLLOWING A MEAL. 05/19/16   [name], PA-C  metoprolol succinate (TOPROL-XL) 100 MG 24 hr tablet TAKE 1 TABLET BY MOUTH DAILY. TAKE WITH OR IMMEDIATELY FOLLOWING A MEAL. Patient not taking: Reported on 09/03/2016 08/31/16   [name], PA-C  pravastatin (PRAVACHOL) 40 MG tablet Take one tab every other day for one month. Stop is muscle pain returns and call the office. After one month take one daily if no pain. 10/15/16   Tereasa Coop, PA-C   Social History   Social History  . Marital status: Married    Spouse name: N/A  . Number of children: N/A  . Years of education: N/A   Occupational History  . Not on file.     Social History Main Topics  . Smoking status: Never Smoker  . Smokeless tobacco: Never Used  . Alcohol use No  . Drug use: No  . Sexual activity: Yes   Other Topics Concern  . Not on file   Social History Narrative  . No narrative on file   Review of Systems  Constitutional: Negative for fever.  Respiratory: Negative for shortness of breath.   Cardiovascular: Negative for chest pain.  Gastrointestinal: Positive for abdominal pain. Negative for blood in stool, constipation, nausea and vomiting.  Genitourinary: Negative for difficulty urinating.      Objective:   Physical Exam  Constitutional: She is oriented to person, place, and time. She appears well-developed and well-nourished.  HENT:  Head: Normocephalic and atraumatic.  Eyes: Pupils are equal, round, and reactive to light. Conjunctivae and EOM are normal.  Neck: Carotid bruit is not present.  Cardiovascular: Normal rate, regular rhythm and intact distal pulses.   Murmur heard.  Systolic murmur is present with a grade of 2/6  Pulmonary/Chest: Effort normal and breath sounds normal.  Abdominal: Soft. She exhibits no pulsatile midline mass. There is no tenderness. There is no tenderness at McBurney's point.  Slight pressure feeling with Murphy's and RUQ palpation.  Neurological: She is alert and oriented to person, place, and time.  Skin: Skin is warm and dry.  Psychiatric: She has a normal mood and affect. Her behavior is normal.  Vitals reviewed.  Vitals:   01/21/17 1111  BP: 127/79  Pulse: 99  Resp: 17  Temp: 99.2 F (37.3 C)  TempSrc: Oral  SpO2: 98%  Weight: 205 lb (93 kg)  Height: 4\' 10"  (1.473 m)   Results for orders placed or performed in visit on 01/21/17  POCT CBC  Result Value Ref Range   WBC 6.6 4.6 - 10.2 K/uL   Lymph, poc 2.4 0.6 - 3.4   POC LYMPH PERCENT 36.6 10 - 50 %L   MID (cbc) 0.2 0 - 0.9   POC MID % 3.2 0 - 12 %M   POC Granulocyte 4.0 2 - 6.9   Granulocyte percent 60.2 37 - 80 %G    RBC 5.08 4.04 - 5.48 M/uL   Hemoglobin 12.2 12.2 - 16.2 g/dL   HCT, POC 38.7 37.7 - 47.9 %   MCV 76.1 (A) 80 - 97 fL   MCH, POC 24.0 (A) 27 - 31.2 pg   MCHC 31.6 (A) 31.8 - 35.4 g/dL   RDW, POC 17.7 %   Platelet Count, POC 378 142 - 424 K/uL   MPV 8.1 0 - 99.8 fL      Assessment & Plan:  [name] is a 61 y.o. female RUQ abdominal pain - Plan: POCT CBC, US  Abdomen Limited RUQ  Type 2 diabetes mellitus without complication, without long-term current use of insulin (HCC) - Plan: Hemoglobin A1c  Hypercholesterolemia - Plan: Comprehensive metabolic panel, Lipid panel  Essential hypertension - Plan: Comprehensive metabolic panel  Intermittent abdominal pain, right upper quadrant. Differential includes cholelithiasis. Overall reassuring CBC, vitals, exam at present. Start with LFTs on CMP, check right upper quadrant ultrasound. ER/RTC precautions if worsening. Did recommend against fatty/fried food for now.  History of hyperlipidemia, diabetes, hypertension, overdue for follow-up, will start with lab work and can follow-up with her primary provider within next month to discuss these further.  No orders of the defined types were placed in this encounter.  Patient Instructions   I will check liver tests, other electrolytes for your abdominal pain. As long as your blood counts are normal today, can check the ultrasound next week to also look at her gallbladder. For now avoid fried/fatty foods, Tylenol is okay if needed, and if any worsening abdominal pain, fevers, nausea or vomiting, return here or emergency room sooner.  Because you are fasting today, I did check A1c, cholesterol tests, but follow up with [name] within the next few weeks if possible for diabetes and to discuss your other medications.  IF you received an x-ray today, you will receive an invoice from [facility]. Please contact [facility] at [phone] with questions or concerns regarding  your invoice.   IF you received labwork today, you will receive an invoice from [place]. Please contact [facility] at [phone] with questions or concerns regarding your invoice.   Our billing staff will not be able to assist you with questions regarding bills from these companies.  You will be contacted with the lab results as soon as they are available. The fastest way to get your results is to activate your My Chart account. Instructions are located on the last page of this paperwork. If you have not heard from [place] regarding the results in 2 weeks, please contact this office.       I personally performed the services described in this documentation, which was scribed in my presence. The recorded information has been reviewed and considered for accuracy and completeness, addended by me as needed, and agree with information above.  Signed,   Merri Ray, MD Primary Care at [place].  01/22/17 3:34 PM

## 2017-01-22 LAB — COMPREHENSIVE METABOLIC PANEL
ALT: 16 IU/L (ref 0–32)
AST: 16 IU/L (ref 0–40)
Albumin/Globulin Ratio: 1.6 (ref 1.2–2.2)
Albumin: 4.9 g/dL (ref 3.5–5.5)
Alkaline Phosphatase: 73 IU/L (ref 39–117)
BUN/Creatinine Ratio: 17 (ref 9–23)
BUN: 15 mg/dL (ref 6–24)
Bilirubin Total: 0.2 mg/dL (ref 0.0–1.2)
CO2: 26 mmol/L (ref 20–29)
Calcium: 10.3 mg/dL — ABNORMAL HIGH (ref 8.7–10.2)
Chloride: 98 mmol/L (ref 96–106)
Creatinine, Ser: 0.87 mg/dL (ref 0.57–1.00)
GFR calc Af Amer: 84 mL/min/{1.73_m2} (ref >59)
GFR calc non Af Amer: 73 mL/min/{1.73_m2} (ref >59)
Globulin, Total: 3.1 g/dL (ref 1.5–4.5)
Glucose: 113 mg/dL — ABNORMAL HIGH (ref 65–99)
Potassium: 4 mmol/L (ref 3.5–5.2)
Sodium: 140 mmol/L (ref 134–144)
Total Protein: 8 g/dL (ref 6.0–8.5)

## 2017-01-22 LAB — LIPID PANEL
Chol/HDL Ratio: 4.5 ratio — ABNORMAL HIGH (ref 0.0–4.4)
Cholesterol, Total: 205 mg/dL — ABNORMAL HIGH (ref 100–199)
HDL: 46 mg/dL (ref >39)
LDL Calculated: 121 mg/dL — ABNORMAL HIGH (ref 0–99)
Triglycerides: 192 mg/dL — ABNORMAL HIGH (ref 0–149)
VLDL Cholesterol Cal: 38 mg/dL (ref 5–40)

## 2017-01-22 LAB — HEMOGLOBIN A1C
Est. average glucose Bld gHb Est-mCnc: 157 mg/dL
Hgb A1c MFr Bld: 7.1 % — ABNORMAL HIGH (ref 4.8–5.6)

## 2017-01-25 MED FILL — GABAPENTIN 300 MG CAPSULE: 300 | 30 days supply | Qty: 90 | Fill #1

## 2017-01-31 MED FILL — METOPROLOL SUCC ER 100 MG T: 100 | 30 days supply | Qty: 30 | Fill #5

## 2017-02-01 MED FILL — POLY-IRON 150 MG CAPSULE: 150 | 30 days supply | Qty: 30 | Fill #2

## 2017-02-04 ENCOUNTER — Ambulatory Visit: Payer: BLUE CROSS/BLUE SHIELD | Admitting: Urgent Care

## 2017-02-07 ENCOUNTER — Telehealth: Payer: Self-pay | Admitting: Family Medicine

## 2017-02-09 NOTE — Telephone Encounter (Signed)
Message received: "pt states that she hasnt been taking pravachol since May because her legs was cramping and when she stop taking the medicine the cramps went away and want to know is there is something else pt can take "  She can try to take Pravachol every other day to see if she still experiences cramps, or could try different statin. I see where she has taken Lipitor in the past or atorvastatin, did she tolerate that better?

## 2017-02-11 NOTE — Telephone Encounter (Signed)
Pt would like to try the lipitor.

## 2017-02-12 MED ORDER — ATORVASTATIN CALCIUM 10 MG PO TABS: 10.0000 mg | ORAL_TABLET | Freq: Every day | ORAL | 1 refills | Status: DC

## 2017-02-12 NOTE — Telephone Encounter (Signed)
Lipitor sent in. She can initially start that every other day and if tolerating can go to every day. If the muscle cramps return, let me know and we can either look at other options.

## 2017-02-14 MED FILL — ATORVASTATIN 10 MG TABLET: 10 | 90 days supply | Qty: 90 | Fill #0

## 2017-02-14 NOTE — Telephone Encounter (Signed)
Informed patient .  Voiced understanding

## 2017-02-16 ENCOUNTER — Other Ambulatory Visit: Payer: Self-pay | Admitting: Urgent Care

## 2017-02-16 ENCOUNTER — Other Ambulatory Visit: Payer: Self-pay

## 2017-02-16 DIAGNOSIS — I10 Essential (primary) hypertension: Secondary | ICD-10-CM

## 2017-02-16 MED FILL — LOSARTAN-HCTZ 100-25 MG TAB: 100-25 | 15 days supply | Qty: 15 | Fill #0

## 2017-02-16 MED FILL — CELECOXIB 200 MG CAPS: 200 | 30 days supply | Qty: 30 | Fill #2

## 2017-02-18 ENCOUNTER — Ambulatory Visit (INDEPENDENT_AMBULATORY_CARE_PROVIDER_SITE_OTHER): Payer: BLUE CROSS/BLUE SHIELD | Admitting: Urgent Care

## 2017-02-18 ENCOUNTER — Encounter: Payer: Self-pay | Admitting: Urgent Care

## 2017-02-18 VITALS — BP 130/74 | HR 99 | Temp 98.5°F | Resp 16 | Ht <= 58 in | Wt 205.8 lb

## 2017-02-18 DIAGNOSIS — Z23 Encounter for immunization: Secondary | ICD-10-CM | POA: Diagnosis not present

## 2017-02-18 DIAGNOSIS — I10 Essential (primary) hypertension: Secondary | ICD-10-CM | POA: Diagnosis not present

## 2017-02-18 DIAGNOSIS — Z0271 Encounter for disability determination: Secondary | ICD-10-CM

## 2017-02-18 DIAGNOSIS — E119 Type 2 diabetes mellitus without complications: Secondary | ICD-10-CM

## 2017-02-18 MED ORDER — METFORMIN HCL 1000 MG PO TABS: 1000.0000 mg | ORAL_TABLET | Freq: Two times a day (BID) | ORAL | 1 refills | Status: DC

## 2017-02-18 MED ORDER — METOPROLOL SUCCINATE ER 100 MG PO TB24: 100.0000 mg | ORAL_TABLET | Freq: Every day | ORAL | 1 refills | Status: DC

## 2017-02-18 MED ORDER — AMLODIPINE BESYLATE 10 MG PO TABS: 10.0000 mg | ORAL_TABLET | Freq: Every day | ORAL | 1 refills | Status: DC

## 2017-02-18 MED ORDER — LOSARTAN POTASSIUM-HCTZ 100-25 MG PO TABS: 1.0000 | ORAL_TABLET | Freq: Every day | ORAL | 1 refills | Status: DC

## 2017-02-18 NOTE — Patient Instructions (Addendum)
Diabetes Mellitus and Food It is important for you to manage your blood sugar (glucose) level. Your blood glucose level can be greatly affected by what you eat. Eating healthier foods in the appropriate amounts throughout the day at about the same time each day will help you control your blood glucose level. It can also help slow or prevent worsening of your diabetes mellitus. Healthy eating may even help you improve the level of your blood pressure and reach or maintain a healthy weight. General recommendations for healthful eating and cooking habits include:  Eating meals and snacks regularly. Avoid going long periods of time without eating to lose weight.  Eating a diet that consists mainly of plant-based foods, such as fruits, vegetables, nuts, legumes, and whole grains.  Using low-heat cooking methods, such as baking, instead of high-heat cooking methods, such as deep frying.  Work with your dietitian to make sure you understand how to use the Nutrition Facts information on food labels. How can food affect me? Carbohydrates Carbohydrates affect your blood glucose level more than any other type of food. Your dietitian will help you determine how many carbohydrates to eat at each meal and teach you how to count carbohydrates. Counting carbohydrates is important to keep your blood glucose at a healthy level, especially if you are using insulin or taking certain medicines for diabetes mellitus. Alcohol Alcohol can cause sudden decreases in blood glucose (hypoglycemia), especially if you use insulin or take certain medicines for diabetes mellitus. Hypoglycemia can be a life-threatening condition. Symptoms of hypoglycemia (sleepiness, dizziness, and disorientation) are similar to symptoms of having too much alcohol. If your health care provider has given you approval to drink alcohol, do so in moderation and use the following guidelines:  Women should not have more than one drink per day, and men  should not have more than two drinks per day. One drink is equal to: ? 12 oz of beer. ? 5 oz of wine. ? 1 oz of hard liquor.  Do not drink on an empty stomach.  Keep yourself hydrated. Have water, diet soda, or unsweetened iced tea.  Regular soda, juice, and other mixers might contain a lot of carbohydrates and should be counted.  What foods are not recommended? As you make food choices, it is important to remember that all foods are not the same. Some foods have fewer nutrients per serving than other foods, even though they might have the same number of calories or carbohydrates. It is difficult to get your body what it needs when you eat foods with fewer nutrients. Examples of foods that you should avoid that are high in calories and carbohydrates but low in nutrients include:  Trans fats (most processed foods list trans fats on the Nutrition Facts label).  Regular soda.  Juice.  Candy.  Sweets, such as cake, pie, doughnuts, and cookies.  Fried foods.  What foods can I eat? Eat nutrient-rich foods, which will nourish your body and keep you healthy. The food you should eat also will depend on several factors, including:  The calories you need.  The medicines you take.  Your weight.  Your blood glucose level.  Your blood pressure level.  Your cholesterol level.  You should eat a variety of foods, including:  Protein. ? Lean cuts of meat. ? Proteins low in saturated fats, such as fish, egg whites, and beans. Avoid processed meats.  Fruits and vegetables. ? Fruits and vegetables that may help control blood glucose levels, such as apples,   mangoes, and yams.  Dairy products. ? Choose fat-free or low-fat dairy products, such as milk, yogurt, and cheese.  Grains, bread, pasta, and rice. ? Choose whole grain products, such as multigrain bread, whole oats, and brown rice. These foods may help control blood pressure.  Fats. ? Foods containing healthful fats, such as  nuts, avocado, olive oil, canola oil, and fish.  Does everyone with diabetes mellitus have the same meal plan? Because every person with diabetes mellitus is different, there is not one meal plan that works for everyone. It is very important that you meet with a dietitian who will help you create a meal plan that is just right for you. This information is not intended to replace advice given to you by your health care provider. Make sure you discuss any questions you have with your health care provider. Document Released: 02/04/2005 Document Revised: 10/16/2015 Document Reviewed: 04/06/2013 Elsevier Interactive Patient Education  2017 [employer]    Hypertension Hypertension, commonly called high blood pressure, is when the force of blood pumping through the arteries is too strong. The arteries are the blood vessels that carry blood from the heart throughout the body. Hypertension forces the heart to work harder to pump blood and may cause arteries to become narrow or stiff. Having untreated or uncontrolled hypertension can cause heart attacks, strokes, kidney disease, and other problems. A blood pressure reading consists of a higher number over a lower number. Ideally, your blood pressure should be below 120/80. The first ("top") number is called the systolic pressure. It is a measure of the pressure in your arteries as your heart beats. The second ("bottom") number is called the diastolic pressure. It is a measure of the pressure in your arteries as the heart relaxes. What are the causes? The cause of this condition is not known. What increases the risk? Some risk factors for high blood pressure are under your control. Others are not. Factors you can change  Smoking.  Having type 2 diabetes mellitus, high cholesterol, or both.  Not getting enough exercise or physical activity.  Being overweight.  Having too much fat, sugar, calories, or salt (sodium) in your diet.  Drinking too much  alcohol. Factors that are difficult or impossible to change  Having chronic kidney disease.  Having a family history of high blood pressure.  Age. Risk increases with age.  Race. You may be at higher risk if you are African-American.  Gender. Men are at higher risk than women before age 75. After age 60, women are at higher risk than men.  Having obstructive sleep apnea.  Stress. What are the signs or symptoms? Extremely high blood pressure (hypertensive crisis) may cause:  Headache.  Anxiety.  Shortness of breath.  Nosebleed.  Nausea and vomiting.  Severe chest pain.  Jerky movements you cannot control (seizures).  How is this diagnosed? This condition is diagnosed by measuring your blood pressure while you are seated, with your arm resting on a surface. The cuff of the blood pressure monitor will be placed directly against the skin of your upper arm at the level of your heart. It should be measured at least twice using the same arm. Certain conditions can cause a difference in blood pressure between your right and left arms. Certain factors can cause blood pressure readings to be lower or higher than normal (elevated) for a short period of time:  When your blood pressure is higher when you are in a health care provider's office than when  you are at home, this is called white coat hypertension. Most people with this condition do not need medicines.  When your blood pressure is higher at home than when you are in a health care provider's office, this is called masked hypertension. Most people with this condition may need medicines to control blood pressure.  If you have a high blood pressure reading during one visit or you have normal blood pressure with other risk factors:  You may be asked to return on a different day to have your blood pressure checked again.  You may be asked to monitor your blood pressure at home for 1 week or longer.  If you are diagnosed with  hypertension, you may have other blood or imaging tests to help your health care provider understand your overall risk for other conditions. How is this treated? This condition is treated by making healthy lifestyle changes, such as eating healthy foods, exercising more, and reducing your alcohol intake. Your health care provider may prescribe medicine if lifestyle changes are not enough to get your blood pressure under control, and if:  Your systolic blood pressure is above 130.  Your diastolic blood pressure is above 80.  Your personal target blood pressure may vary depending on your medical conditions, your age, and other factors. Follow these instructions at home: Eating and drinking  Eat a diet that is high in fiber and potassium, and low in sodium, added sugar, and fat. An example eating plan is called the DASH (Dietary Approaches to Stop Hypertension) diet. To eat this way: ? Eat plenty of fresh fruits and vegetables. Try to fill half of your plate at each meal with fruits and vegetables. ? Eat whole grains, such as whole wheat pasta, brown rice, or whole grain bread. Fill about one quarter of your plate with whole grains. ? Eat or drink low-fat dairy products, such as skim milk or low-fat yogurt. ? Avoid fatty cuts of meat, processed or cured meats, and poultry with skin. Fill about one quarter of your plate with lean proteins, such as fish, chicken without skin, beans, eggs, and tofu. ? Avoid premade and processed foods. These tend to be higher in sodium, added sugar, and fat.  Reduce your daily sodium intake. Most people with hypertension should eat less than 1,500 mg of sodium a day.  Limit alcohol intake to no more than 1 drink a day for nonpregnant women and 2 drinks a day for men. One drink equals 12 oz of beer, 5 oz of wine, or 1 oz of hard liquor. Lifestyle  Work with your health care provider to maintain a healthy body weight or to lose weight. Ask what an ideal weight is  for you.  Get at least 30 minutes of exercise that causes your heart to beat faster (aerobic exercise) most days of the week. Activities may include walking, swimming, or biking.  Include exercise to strengthen your muscles (resistance exercise), such as pilates or lifting weights, as part of your weekly exercise routine. Try to do these types of exercises for 30 minutes at least 3 days a week.  Do not use any products that contain nicotine or tobacco, such as cigarettes and e-cigarettes. If you need help quitting, ask your health care provider.  Monitor your blood pressure at home as told by your health care provider.  Keep all follow-up visits as told by your health care provider. This is important. Medicines  Take over-the-counter and prescription medicines only as told by your health  care provider. Follow directions carefully. Blood pressure medicines must be taken as prescribed.  Do not skip doses of blood pressure medicine. Doing this puts you at risk for problems and can make the medicine less effective.  Ask your health care provider about side effects or reactions to medicines that you should watch for. Contact a health care provider if:  You think you are having a reaction to a medicine you are taking.  You have headaches that keep coming back (recurring).  You feel dizzy.  You have swelling in your ankles.  You have trouble with your vision. Get help right away if:  You develop a severe headache or confusion.  You have unusual weakness or numbness.  You feel faint.  You have severe pain in your chest or abdomen.  You vomit repeatedly.  You have trouble breathing. Summary  Hypertension is when the force of blood pumping through your arteries is too strong. If this condition is not controlled, it may put you at risk for serious complications.  Your personal target blood pressure may vary depending on your medical conditions, your age, and other factors. For most  people, a normal blood pressure is less than 120/80.  Hypertension is treated with lifestyle changes, medicines, or a combination of both. Lifestyle changes include weight loss, eating a healthy, low-sodium diet, exercising more, and limiting alcohol. This information is not intended to replace advice given to you by your health care provider. Make sure you discuss any questions you have with your health care provider. Document Released: 05/10/2005 Document Revised: 04/07/2016 Document Reviewed: 04/07/2016 Elsevier Interactive Patient Education  2018 [employer].     IF you received an x-ray today, you will receive an invoice from [facility]. Please contact [facility] at [phone] with questions or concerns regarding your invoice.   IF you received labwork today, you will receive an invoice from [facility]. Please contact [facility] at [phone] with questions or concerns regarding your invoice.   Our billing staff will not be able to assist you with questions regarding bills from these companies.  You will be contacted with the lab results as soon as they are available. The fastest way to get your results is to activate your My Chart account. Instructions are located on the last page of this paperwork. If you have not heard from [place] regarding the results in 2 weeks, please contact this office.

## 2017-02-18 NOTE — Progress Notes (Signed)
   MRN: [id number] DOB: [birth year]  Subjective:   [name] is a 61 y.o. female presenting for follow up and medication refills. Denies dizziness, chronic headache, chest pain, shortness of breath, heart racing, palpitations, nausea, vomiting, abdominal pain, hematuria, lower leg swelling. Denies polyuria, polydipsia. Admits that she is not very compliant with her diet. She has not exercised consistently. Denies smoking cigarettes or drinking alcohol.   [name] has a current medication list which includes the following prescription(s): albuterol, amlodipine, atorvastatin, celecoxib, cyclobenzaprine, iron polysaccharides, lansoprazole, losartan-hydrochlorothiazide, metformin, metoprolol succinate, metoprolol succinate, and pravastatin. Also is allergic to sulfa antibiotics.  [name]  has a past medical history of Anemia; Arthritis; DM type 2 (diabetes mellitus, type 2) ([place]); Heart murmur; HTN (hypertension); Hypercholesterolemia; and Reflux. Also  has a past surgical history that includes Cesarean section and Tubal ligation.  Objective:   Vitals: BP 130/74   Pulse 99   Temp 98.5 F (36.9 C) (Oral)   Resp 16   Ht 4\' 10"  (1.473 m)   Wt 205 lb 12.8 oz (93.4 kg)   SpO2 96%   BMI 43.01 kg/m   BP Readings from Last 3 Encounters:  02/18/17 130/74  01/21/17 127/79  09/03/16 138/83   Physical Exam  Constitutional: She is oriented to person, place, and time. She appears well-developed and well-nourished.  HENT:  Mouth/Throat: Oropharynx is clear and moist.  Eyes: No scleral icterus.  Cardiovascular: Normal rate, regular rhythm and intact distal pulses.  Exam reveals no gallop and no friction rub.   No murmur heard. Pulmonary/Chest: No respiratory distress. She has no wheezes. She has no rales.  Neurological: She is alert and oriented to person, place, and time.  Skin: Skin is warm and dry.  Psychiatric: She has a normal mood and affect.   Assessment and Plan :   1. Essential  hypertension - Stable, continue current regimen. No need for labs today, they were completed on 01/21/2017. Follow up in 6 months.  2. Type 2 diabetes mellitus without complication, without long-term current use of insulin (HCC) - Recommended dietary modifications. She is otherwise, stable. Will recheck in 6 months.   3. Need for prophylactic vaccination and inoculation against influenza - Flu Vaccine QUAD 6+ mos PF IM (Fluarix Quad PF)  4. Need for Tdap vaccination - Tdap vaccine greater than or equal to 7yo IM  5. Need for prophylactic vaccination against Streptococcus pneumoniae (pneumococcus) - Pneumococcal polysaccharide vaccine 23-valent greater than or equal to 2yo subcutaneous/IM   [name], PA-C [facility] [phone] 02/18/2017 11:49 AM

## 2017-02-28 ENCOUNTER — Ambulatory Visit (INDEPENDENT_AMBULATORY_CARE_PROVIDER_SITE_OTHER): Payer: BLUE CROSS/BLUE SHIELD | Admitting: Family Medicine

## 2017-02-28 ENCOUNTER — Encounter: Payer: Self-pay | Admitting: Family Medicine

## 2017-02-28 VITALS — BP 146/80 | HR 112 | Temp 98.1°F | Resp 18 | Ht <= 58 in | Wt 206.0 lb

## 2017-02-28 DIAGNOSIS — M545 Low back pain, unspecified: Secondary | ICD-10-CM

## 2017-02-28 DIAGNOSIS — R109 Unspecified abdominal pain: Secondary | ICD-10-CM | POA: Diagnosis not present

## 2017-02-28 LAB — POCT URINALYSIS DIP (MANUAL ENTRY)
Bilirubin, UA: NEGATIVE
Blood, UA: NEGATIVE
Glucose, UA: NEGATIVE mg/dL
Nitrite, UA: NEGATIVE
Protein Ur, POC: 30 mg/dL — AB
Spec Grav, UA: 1.025 (ref 1.010–1.025)
Urobilinogen, UA: 0.2 E.U./dL
pH, UA: 5.5 (ref 5.0–8.0)

## 2017-02-28 LAB — POC MICROSCOPIC URINALYSIS (UMFC): Mucus: ABSENT

## 2017-02-28 NOTE — Patient Instructions (Addendum)
The urine test did not indicate infection. Symptoms are most likely due to low back, which may be either a strain or pinched nerve. Okay to continue your current medications, but you can also take the Flexeril up to 3 times per day. Heat or ice to the affected area and gentle range of motion/stretches. If you're not improving into next week, or worsening sooner, follow-up and we can discuss x-rays or next step.  Return to the clinic or go to the nearest emergency room if any of your symptoms worsen or new symptoms occur.  We recommend that you schedule a mammogram for breast cancer screening. Typically, you do not need a referral to do this. Please contact a local imaging center to schedule your mammogram.  [facility] - [phone]  *ask for the Radiology Department [facility] - [phone] or [phone]  [facility] - [phone] [place] [phone] [facility] - [phone]  *ask for the [facility] - [phone]  *ask for the Radiology Department [facility] - [phone]  *ask for the [place] - [phone]     IF you received an x-ray today, you will receive an invoice from [facility]. Please contact [facility] at [phone] with questions or concerns regarding your invoice.   IF you received labwork today, you will receive an invoice from [facility]. Please contact [facility] at [phone] with questions or concerns regarding your invoice.   Our billing staff will not be able to assist you with questions regarding bills from these companies.  You will be contacted with the lab results as soon as they are available. The fastest way to get your results is to activate your My Chart account. Instructions are located on the last page of this paperwork. If you have not heard from [place] regarding the  results in 2 weeks, please contact this office.

## 2017-02-28 NOTE — Progress Notes (Signed)
Subjective:  By signing my name below, I, [name], attest that this documentation has been prepared under the direction and in the presence of Merri Ray, MD. Electronically Signed: Moises [name], [place]. 02/28/2017 , 4:51 PM .  Patient was seen in Room 10 .   Patient ID: [name], female    DOB: 1956/03/29, 61 y.o.   MRN: [id number] Chief Complaint  Patient presents with  . Flank Pain    left side pain x4 days; started in abdomen; denies any urinary sxs  . Back Pain    pain radiates from side to back   HPI [name] is a 61 y.o. female  Patient states having upper abdominal pain that started about a week ago. After 3 days, the pain started radiating over to her right flank and into right side of her back and has settled there. Her back has become really sore to the point she had difficulty moving and unable to drive her car. About 2 days ago, her pain had improved a little, but still present. She denies nausea, fever, vomiting, diarrhea, [name] in stool, rash, dysuria, frequency, or hematuria. She denies history of kidney stones. Her last UTI was a few years ago. She's taken tylenol. She denies any change in activity or any known injury. She denies urinary or bowel incontinence, or saddle anesthesia.   She rarely checks her [name] sugars. She denies any change in blurry vision, or any increased thirst. She denies appetite loss.   Patient Active Problem List   Diagnosis Date Noted  . Osteoarthritis of left hip 04/07/2014  . BMI 40.0-44.9, adult ([place]) 04/07/2014  . DM type 2 (diabetes mellitus, type 2) ([place])   . HTN (hypertension)   . Hypercholesterolemia   . Reflux    Past Medical History:  Diagnosis Date  . Anemia   . Arthritis   . DM type 2 (diabetes mellitus, type 2) ([place])   . Heart murmur   . HTN (hypertension)   . Hypercholesterolemia   . Reflux    Past Surgical History:  Procedure Laterality Date  . CESAREAN SECTION     x3  . TUBAL LIGATION      Allergies  Allergen Reactions  . Sulfa Antibiotics Hives   Prior to Admission medications   Medication Sig Start Date End Date Taking? Authorizing Provider  albuterol (PROAIR HFA) 108 (90 BASE) MCG/ACT inhaler Inhale 2 puffs into the lungs every 6 (six) hours as needed. 07/21/13   Roselee Culver, MD  amLODipine (NORVASC) 10 MG tablet Take 1 tablet (10 mg total) by mouth daily. 02/18/17   [name], PA-C  atorvastatin (LIPITOR) 10 MG tablet Take 1 tablet (10 mg total) by mouth daily. 02/12/17   Wendie Agreste, MD  celecoxib (CELEBREX) 200 MG capsule Take one capsule by mouth daily as needed for pain. 05/19/16   [name], PA-C  cyclobenzaprine (FLEXERIL) 5 MG tablet TAKE 1 TABLET BY MOUTH 3 TIMES DAILY AS NEEDED. 01/20/17   [name], PA-C  iron polysaccharides (NIFEREX) 150 MG capsule Take 1 capsule (150 mg total) by mouth daily. 12/04/16   Wendie Agreste, MD  lansoprazole (PREVACID) 30 MG capsule TAKE 1 CAPSULE BY MOUTH DAILY. 12/16/16   [name], PA-C  losartan-hydrochlorothiazide (HYZAAR) 100-25 MG tablet Take 1 tablet by mouth daily. 02/18/17   [name], PA-C  metFORMIN (GLUCOPHAGE) 1000 MG tablet Take 1 tablet (1,000 mg total) by mouth 2 (two) times daily with a meal. 02/18/17  [name], PA-C  metoprolol succinate (TOPROL-XL) 100 MG 24 hr tablet Take 1 tablet (100 mg total) by mouth daily. Take with or immediately following a meal. 02/18/17   [name], PA-C   Social History   Social History  . Marital status: Married    Spouse name: N/A  . Number of children: N/A  . Years of education: N/A   Occupational History  . Not on file.   Social History Main Topics  . Smoking status: Never Smoker  . Smokeless tobacco: Never Used  . Alcohol use No  . Drug use: No  . Sexual activity: Yes   Other Topics Concern  . Not on file   Social History Narrative  . No narrative on file   Review of Systems  Constitutional: Negative for chills, fatigue, fever and  unexpected weight change.  Eyes: Negative for visual disturbance.  Respiratory: Negative for cough.   Gastrointestinal: Negative for [name] in stool, constipation, diarrhea, nausea and vomiting.  Endocrine: Negative for polydipsia.  Genitourinary: Positive for flank pain. Negative for dysuria, frequency, hematuria and urgency.  Musculoskeletal: Positive for back pain.  Skin: Negative for rash and wound.  Neurological: Negative for dizziness, weakness and headaches.       Objective:   Physical Exam  Constitutional: She is oriented to person, place, and time. She appears well-developed and well-nourished. No distress.  HENT:  Head: Normocephalic and atraumatic.  Eyes: Pupils are equal, round, and reactive to light. EOM are normal.  Neck: Neck supple.  Cardiovascular: Normal rate.   Pulmonary/Chest: Effort normal. No respiratory distress.  Abdominal:  Possible slight CVA tenderness versus paraspinals; able to heel-toe walk without difficulty, minimal discomfort but full flexion, pulling sensation with right lateral flexion  Musculoskeletal: Normal range of motion.  Neurological: She is alert and oriented to person, place, and time.  Skin: Skin is warm, dry and intact. No rash noted.  Psychiatric: She has a normal mood and affect. Her behavior is normal.  Nursing note and vitals reviewed.   Vitals:   02/28/17 1630 02/28/17 1659 02/28/17 1715  BP: (!) 144/82 (!) 144/82 (!) 146/80  Pulse: (!) 112    Resp: 18    Temp: 98.1 F (36.7 C)    TempSrc: Oral    SpO2: 97%    Weight: 206 lb (93.4 kg)    Height: 4\' 10"  (1.473 m)     Results for orders placed or performed in visit on 02/28/17  POCT urinalysis dipstick  Result Value Ref Range   Color, UA yellow yellow   Clarity, UA clear clear   Glucose, UA negative negative mg/dL   Bilirubin, UA negative negative   Ketones, POC UA trace (5) (A) negative mg/dL   Spec Grav, UA 1.025 1.010 - 1.025   [name], UA negative negative   pH, UA  5.5 5.0 - 8.0   Protein Ur, POC =30 (A) negative mg/dL   Urobilinogen, UA 0.2 0.2 or 1.0 E.U./dL   Nitrite, UA Negative Negative   Leukocytes, UA Trace (A) Negative  POCT Microscopic Urinalysis ([facility])  Result Value Ref Range   WBC,UR,HPF,POC None None WBC/hpf   RBC,UR,HPF,POC None None RBC/hpf   Bacteria Few (A) None, Too numerous to count   Mucus Absent Absent   Epithelial Cells, UR Per Microscopy Many (A) None, Too numerous to count cells/hpf       Assessment & Plan:  [name] is a 60 y.o. female Acute left-sided low back pain without sciatica  Acute  left flank pain - Plan: POCT urinalysis dipstick, POCT Microscopic Urinalysis ([facility])  Reassuring urinalysis. Possible radiating pain from low back strain. No red flags on exam or history. Decided against imaging at present.  -Continue Tylenol, but I added Flexeril up to 3 times a day, potential side effects discussed, heat/ice, range of motion as tolerated.  -If not improving in 1 week, or worsening sooner, return for possible x-rays  No orders of the defined types were placed in this encounter.  Patient Instructions   The urine test did not indicate infection. Symptoms are most likely due to low back, which may be either a strain or pinched nerve. Okay to continue your current medications, but you can also take the Flexeril up to 3 times per day. Heat or ice to the affected area and gentle range of motion/stretches. If you're not improving into next week, or worsening sooner, follow-up and we can discuss x-rays or next step.  Return to the clinic or go to the nearest emergency room if any of your symptoms worsen or new symptoms occur.  We recommend that you schedule a mammogram for breast cancer screening. Typically, you do not need a referral to do this. Please contact a local imaging center to schedule your mammogram.  [facility] - [phone]  *ask for the Radiology Department [facility] ([place]) - [phone] or [phone]  [facility] - [phone] [place] [phone] [facility] - [phone]  *ask for the [facility] - [phone]  *ask for the Radiology Department [facility] - [phone]  *ask for the [facility] - [phone]     IF you received an x-ray today, you will receive an invoice from [facility]. Please contact [facility] Radiology at [phone] with questions or concerns regarding your invoice.   IF you received labwork today, you will receive an invoice from [employer]. Please contact [facility] at [phone] with questions or concerns regarding your invoice.   Our billing staff will not be able to assist you with questions regarding bills from these companies.  You will be contacted with the lab results as soon as they are available. The fastest way to get your results is to activate your My Chart account. Instructions are located on the last page of this paperwork. If you have not heard from [place] regarding the results in 2 weeks, please contact this office.       I personally performed the services described in this documentation, which was scribed in my presence. The recorded information has been reviewed and considered for accuracy and completeness, addended by me as needed, and agree with information above.  Signed,   Merri Ray, MD [facility].  03/04/17 6:14 PM

## 2017-03-01 MED FILL — METOPROLOL SUCC ER 100 MG T: 100 | 90 days supply | Qty: 90 | Fill #0

## 2017-03-01 MED FILL — LOSARTAN-HCTZ 100-25 MG TAB: 100-25 | 90 days supply | Qty: 90 | Fill #0

## 2017-03-06 DIAGNOSIS — I10 Essential (primary) hypertension: Secondary | ICD-10-CM | POA: Insufficient documentation

## 2017-03-08 MED FILL — POLY-IRON 150 MG CAPSULE: 150 | 30 days supply | Qty: 30 | Fill #3

## 2017-03-08 MED FILL — AMLODIPINE BESYLATE 10 MG T: 10 | 90 days supply | Qty: 90 | Fill #0

## 2017-03-14 ENCOUNTER — Other Ambulatory Visit: Payer: Self-pay | Admitting: Urgent Care

## 2017-03-14 DIAGNOSIS — M545 Low back pain, unspecified: Secondary | ICD-10-CM

## 2017-03-14 DIAGNOSIS — G8929 Other chronic pain: Secondary | ICD-10-CM

## 2017-03-14 DIAGNOSIS — M1612 Unilateral primary osteoarthritis, left hip: Secondary | ICD-10-CM

## 2017-03-14 DIAGNOSIS — M47816 Spondylosis without myelopathy or radiculopathy, lumbar region: Secondary | ICD-10-CM

## 2017-03-14 MED FILL — CELECOXIB 200 MG CAPS: 200 | 30 days supply | Qty: 30 | Fill #3

## 2017-03-14 MED FILL — LANSOPRAZOLE DR 30 MG CAP: 30 | 90 days supply | Qty: 90 | Fill #1

## 2017-03-14 MED FILL — metFORMIN HCL 1000 MG TABS: 1000 | 90 days supply | Qty: 180 | Fill #1

## 2017-03-14 MED FILL — predniSONE 20 MG TABS: 20 | 9 days supply | Qty: 18 | Fill #0

## 2017-03-15 MED FILL — CYCLOBENZAPRINE 5 MG TABLET: 5 | 30 days supply | Qty: 90 | Fill #0

## 2017-04-12 MED FILL — CELECOXIB 200 MG CAPS: 200 | 30 days supply | Qty: 30 | Fill #4

## 2017-04-12 MED FILL — POLY-IRON 150 MG CAPSULE: 150 | 30 days supply | Qty: 30 | Fill #4

## 2017-04-27 ENCOUNTER — Other Ambulatory Visit: Payer: Self-pay | Admitting: Urgent Care

## 2017-04-27 DIAGNOSIS — M545 Low back pain, unspecified: Secondary | ICD-10-CM

## 2017-04-27 DIAGNOSIS — G8929 Other chronic pain: Secondary | ICD-10-CM

## 2017-04-27 DIAGNOSIS — M47816 Spondylosis without myelopathy or radiculopathy, lumbar region: Secondary | ICD-10-CM

## 2017-04-27 DIAGNOSIS — M1612 Unilateral primary osteoarthritis, left hip: Secondary | ICD-10-CM

## 2017-04-27 MED FILL — GABAPENTIN 300 MG CAPSULE: 300 | 30 days supply | Qty: 90 | Fill #2

## 2017-04-27 NOTE — Telephone Encounter (Signed)
Flexeril refill. Last filled on 03/15/17. Last OV 02/28/17.

## 2017-05-02 ENCOUNTER — Other Ambulatory Visit: Payer: Self-pay | Admitting: Urgent Care

## 2017-05-02 DIAGNOSIS — G8929 Other chronic pain: Secondary | ICD-10-CM

## 2017-05-02 DIAGNOSIS — M545 Low back pain, unspecified: Secondary | ICD-10-CM

## 2017-05-02 DIAGNOSIS — M1612 Unilateral primary osteoarthritis, left hip: Secondary | ICD-10-CM

## 2017-05-02 DIAGNOSIS — M47816 Spondylosis without myelopathy or radiculopathy, lumbar region: Secondary | ICD-10-CM

## 2017-05-04 ENCOUNTER — Telehealth: Payer: Self-pay | Admitting: Urgent Care

## 2017-05-04 NOTE — Telephone Encounter (Signed)
Phone call to pt. to discuss request for  Cyclobenzaprine refill.  Advised that the Cyclobenzaprine was not refilled, as the pt. will need to be re-evaluated first, based on the office note on 02/28/17.  Stated she made an appt. for 05/06/17 for another matter.  Advised she can discuss with Lina Sar, PA. at that appt.  Pt. Verb. Understanding.

## 2017-05-04 NOTE — Telephone Encounter (Signed)
Copied from [place] [phone]. Topic: Quick Communication - See Telephone Encounter >> May 03, 2017 10:51 AM Boyd Kerbs wrote: CRM for notification. See Telephone encounter for:  Patient is calling Refill for  Cyclobenzaprine HCl 5 MG TAKE 1 TABLET BY MOUTH 3 TIMES DAILY AS NEEDED.  [place], [place].    05/03/17. >> May 03, 2017 10:59 AM Boyd Kerbs wrote: Pharmacy told her she had to come back for appt.  She is saying she was just seen in the last couple of months (Dr. Bess Harvest) for this and is not sure why she has to come again. Please call patient and let her know if needs to be seen again.

## 2017-05-06 ENCOUNTER — Encounter: Payer: Self-pay | Admitting: Urgent Care

## 2017-05-06 ENCOUNTER — Ambulatory Visit: Payer: BLUE CROSS/BLUE SHIELD | Admitting: Urgent Care

## 2017-05-06 VITALS — BP 132/80 | HR 91 | Temp 98.6°F | Resp 17 | Ht <= 58 in | Wt 208.0 lb

## 2017-05-06 DIAGNOSIS — M47816 Spondylosis without myelopathy or radiculopathy, lumbar region: Secondary | ICD-10-CM

## 2017-05-06 DIAGNOSIS — G8929 Other chronic pain: Secondary | ICD-10-CM | POA: Diagnosis not present

## 2017-05-06 DIAGNOSIS — M5136 Other intervertebral disc degeneration, lumbar region: Secondary | ICD-10-CM

## 2017-05-06 DIAGNOSIS — M1612 Unilateral primary osteoarthritis, left hip: Secondary | ICD-10-CM | POA: Diagnosis not present

## 2017-05-06 DIAGNOSIS — M51369 Other intervertebral disc degeneration, lumbar region without mention of lumbar back pain or lower extremity pain: Secondary | ICD-10-CM

## 2017-05-06 DIAGNOSIS — M545 Low back pain, unspecified: Secondary | ICD-10-CM

## 2017-05-06 DIAGNOSIS — H9311 Tinnitus, right ear: Secondary | ICD-10-CM | POA: Diagnosis not present

## 2017-05-06 MED ORDER — CYCLOBENZAPRINE HCL 5 MG PO TABS: 5.0000 mg | ORAL_TABLET | Freq: Three times a day (TID) | ORAL | 1 refills | Status: DC | PRN

## 2017-05-06 MED ORDER — FLUTICASONE PROPIONATE 50 MCG/ACT NA SUSP: 2.0000 | Freq: Every day | NASAL | 11 refills | Status: DC

## 2017-05-06 MED ORDER — CETIRIZINE HCL 10 MG PO TABS: 10.0000 mg | ORAL_TABLET | Freq: Every day | ORAL | 11 refills | Status: DC

## 2017-05-06 MED FILL — CYCLOBENZAPRINE 5 MG TABLET: 5 | 30 days supply | Qty: 90 | Fill #0

## 2017-05-06 MED FILL — FLUTICASONE PROP 50 MCG SPR: 50 | 30 days supply | Qty: 16 | Fill #0

## 2017-05-06 NOTE — Progress Notes (Signed)
  MRN: [id number] DOB: 1955/08/17  Subjective:   [name] is a 61 y.o. female presenting for 2 month history of persistent right ear tinnitus. Hears like wind is blowing in her ear. She received antibiotic treatment in 02/2017 for a right ear infection. Denies fever, dizziness, ear drainage. She is also requesting a refill of her Flexeril which she takes for DDD of lumbar region and left hip OA. Would also like a referral to an orthopedist for a consult.  [name] has a current medication list which includes the following prescription(s): albuterol, amlodipine, atorvastatin, celecoxib, cyclobenzaprine, iron polysaccharides, lansoprazole, losartan-hydrochlorothiazide, metformin, and metoprolol succinate. Also is allergic to sulfa antibiotics.  [name]  has a past medical history of Anemia, Arthritis, DM type 2 (diabetes mellitus, type 2) ([place]), Heart murmur, HTN (hypertension), Hypercholesterolemia, and Reflux. Also  has a past surgical history that includes Cesarean section and Tubal ligation.  Objective:   Vitals: BP 132/80   Pulse 91   Temp 98.6 F (37 C) (Oral)   Resp 17   Ht 4\' 10"  (1.473 m)   Wt 208 lb (94.3 kg)   SpO2 98%   BMI 43.47 kg/m   Physical Exam  Constitutional: She is oriented to person, place, and time. She appears well-developed and well-nourished.  HENT:  Mouth/Throat: Oropharynx is clear and moist.  TM's intact bilaterally, no effusions or erythema. Weber and Rinne tests normal.  Eyes: Right eye exhibits no discharge. Left eye exhibits no discharge.  Neck: Normal range of motion. Neck supple.  Cardiovascular: Normal rate.  Pulmonary/Chest: Effort normal.  Lymphadenopathy:    She has no cervical adenopathy.  Neurological: She is alert and oriented to person, place, and time.  Skin: Skin is warm and dry.  Psychiatric: She has a normal mood and affect.   Assessment and Plan :   1. Tinnitus of right ear - Start Zyrtec with Flonase. Offered patient  referral to ENT but patient prefers to wait and will let me know in 1 month if she wants to pursue this.  2. Chronic bilateral low back pain without sciatica 3. Spondylosis of lumbar region without myelopathy or radiculopathy 4. Osteoarthritis of left hip, unspecified osteoarthritis type 5. DDD (degenerative disc disease), lumbar - Refilled Flexeril, referral to ortho is pending. - Ambulatory referral to [place], PA-C [facility] [place] [facility] [phone] 05/06/2017  10:34 AM

## 2017-05-06 NOTE — Patient Instructions (Addendum)
Tinnitus Tinnitus refers to hearing a sound when there is no actual source for that sound. This is often described as ringing in the ears. However, people with this condition may hear a variety of noises. A person may hear the sound in one ear or in both ears. The sounds of tinnitus can be soft, loud, or somewhere in between. Tinnitus can last for a few seconds or can be constant for days. It may go away without treatment and come back at various times. When tinnitus is constant or happens often, it can lead to other problems, such as trouble sleeping and trouble concentrating. Almost everyone experiences tinnitus at some point. Tinnitus that is long-lasting (chronic) or comes back often is a problem that may require medical attention. What are the causes? The cause of tinnitus is often not known. In some cases, it can result from other problems or conditions, including:  Exposure to loud noises from machinery, music, or other sources.  Hearing loss.  Ear or sinus infections.  Earwax buildup.  A foreign object in the ear.  Use of certain medicines.  Use of alcohol and caffeine.  High blood pressure.  Heart diseases.  Anemia.  Allergies.  Meniere disease.  Thyroid problems.  Tumors.  An enlarged part of a weakened blood vessel (aneurysm).  What are the signs or symptoms? The main symptom of tinnitus is hearing a sound when there is no source for that sound. It may sound like:  Buzzing.  Roaring.  Ringing.  Blowing air, similar to the sound heard when you listen to a seashell.  Hissing.  Whistling.  Sizzling.  Humming.  Running water.  A sustained musical note.  How is this diagnosed? Tinnitus is diagnosed based on your symptoms. Your health care provider will do a physical exam. A comprehensive hearing exam (audiologic exam) will be done if your tinnitus:  Affects only one ear (unilateral).  Causes hearing difficulties.  Lasts 6 months or  longer.  You may also need to see a health care provider who specializes in hearing disorders (audiologist). You may be asked to complete a questionnaire to determine the severity of your tinnitus. Tests may be done to help determine the cause and to rule out other conditions. These can include:  Imaging studies of your head and brain, such as: ? A CT scan. ? An MRI.  An imaging study of your blood vessels (angiogram).  How is this treated? Treating an underlying medical condition can sometimes make tinnitus go away. If your tinnitus continues, other treatments may include:  Medicines, such as certain antidepressants or sleeping aids.  Sound generators to mask the tinnitus. These include: ? Tabletop sound machines that play relaxing sounds to help you fall asleep. ? Wearable devices that fit in your ear and play sounds or music. ? A small device that uses headphones to deliver a signal embedded in music (acoustic neural stimulation). In time, this may change the pathways of your brain and make you less sensitive to tinnitus. This device is used for very severe cases when no other treatment is working.  Therapy and counseling to help you manage the stress of living with tinnitus.  Using hearing aids or cochlear implants, if your tinnitus is related to hearing loss.  Follow these instructions at home:  When possible, avoid being in loud places and being exposed to loud sounds.  Wear hearing protection, such as earplugs, when you are exposed to loud noises.  Do not take stimulants, such as nicotine,   alcohol, or caffeine.  Practice techniques for reducing stress, such as meditation, yoga, or deep breathing.  Use a white noise machine, a humidifier, or other devices to mask the sound of tinnitus.  Sleep with your head slightly raised. This may reduce the impact of tinnitus.  Try to get plenty of rest each night. Contact a health care provider if:  You have tinnitus in just one  ear.  Your tinnitus continues for 3 weeks or longer without stopping.  Home care measures are not helping.  You have tinnitus after a head injury.  You have tinnitus along with any of the following: ? Dizziness. ? Loss of balance. ? Nausea and vomiting. This information is not intended to replace advice given to you by your health care provider. Make sure you discuss any questions you have with your health care provider. Document Released: 05/10/2005 Document Revised: 01/11/2016 Document Reviewed: 10/10/2013 Elsevier Interactive Patient Education  2018 [employer].     IF you received an x-ray today, you will receive an invoice from [facility] Radiology. Please contact [facility] Radiology at [phone] with questions or concerns regarding your invoice.   IF you received labwork today, you will receive an invoice from [place]. Please contact [facility] at [phone] with questions or concerns regarding your invoice.   Our billing staff will not be able to assist you with questions regarding bills from these companies.  You will be contacted with the lab results as soon as they are available. The fastest way to get your results is to activate your My Chart account. Instructions are located on the last page of this paperwork. If you have not heard from [place] regarding the results in 2 weeks, please contact this office.

## 2017-05-10 MED FILL — POLY-IRON 150 MG CAPSULE: 150 | 30 days supply | Qty: 30 | Fill #5

## 2017-05-16 MED FILL — CELECOXIB 200 MG CAP: 200 | 30 days supply | Qty: 30 | Fill #5

## 2017-05-19 ENCOUNTER — Telehealth: Payer: Self-pay | Admitting: Urgent Care

## 2017-05-19 NOTE — Telephone Encounter (Signed)
Copied from [place]. Topic: Referral - Question >> May 19, 2017 11:28 AM Ahmed Prima L wrote: Reason for CRM: Pt called & stated the DR she was referred to [facility], they called her & stated she shouldn't have been referred there. She said she was suppose to see a rheumatology dr. Please call her back @ [phone] -----------------------------------------------------------------------------------------  Please advise. Pt states Ortho office says she needs referral to Rheumatology.

## 2017-05-19 NOTE — Telephone Encounter (Signed)
Patient has degenerative disc disease of lumbar region and arthritis of her hip. She has a history of bulging disc. These are conditions managed by ortho not rheumatology. Do they not want to see her? We can try to refer her to a different ortho practice if this is the case.

## 2017-05-24 HISTORY — PX: SPINAL FUSION: SHX223

## 2017-05-26 NOTE — Telephone Encounter (Signed)
Copied from [place]. Topic: Referral - Question >> May 26, 2017  5:23 PM Patrice Paradise wrote: Patient called and stated that she is will to go to another ortho practice.

## 2017-05-28 NOTE — Telephone Encounter (Signed)
Please refer to a different ortho practice asap.

## 2017-05-30 NOTE — Telephone Encounter (Signed)
Spoke with pt and let her know we are switching her referral to [facility] [place]. She was okay with this. Referral sent. Thanks!

## 2017-06-01 MED FILL — LOSARTAN-HCTZ 100-25 MG TAB: 100-25 | 90 days supply | Qty: 90 | Fill #1

## 2017-06-01 MED FILL — METOPROLOL SUCC ER 100 MG T: 100 | 90 days supply | Qty: 90 | Fill #1

## 2017-06-13 ENCOUNTER — Other Ambulatory Visit: Payer: Self-pay

## 2017-06-13 ENCOUNTER — Other Ambulatory Visit: Payer: Self-pay | Admitting: Urgent Care

## 2017-06-13 DIAGNOSIS — K219 Gastro-esophageal reflux disease without esophagitis: Secondary | ICD-10-CM

## 2017-06-13 MED ORDER — LANSOPRAZOLE 30 MG PO CPDR: 30.0000 mg | DELAYED_RELEASE_CAPSULE | Freq: Every day | ORAL | 1 refills | Status: DC

## 2017-06-13 MED FILL — POLY-IRON 150 MG CAPSULE: 150 | 30 days supply | Qty: 30 | Fill #6

## 2017-06-13 MED FILL — LANSOPRAZOLE DR 30 MG CAP: 30 | 90 days supply | Qty: 90 | Fill #0

## 2017-06-13 MED FILL — AMLODIPINE BESYLATE 10 MG T: 10 | 90 days supply | Qty: 90 | Fill #1

## 2017-06-13 MED FILL — metFORMIN HCL 1000 MG TABS: 1000 | 90 days supply | Qty: 180 | Fill #0

## 2017-06-14 ENCOUNTER — Encounter: Payer: Self-pay | Admitting: Urgent Care

## 2017-06-14 ENCOUNTER — Ambulatory Visit: Payer: BLUE CROSS/BLUE SHIELD | Admitting: Urgent Care

## 2017-06-14 VITALS — BP 178/102 | HR 121 | Temp 98.2°F | Resp 18 | Ht <= 58 in | Wt 203.0 lb

## 2017-06-14 DIAGNOSIS — G8929 Other chronic pain: Secondary | ICD-10-CM

## 2017-06-14 DIAGNOSIS — E119 Type 2 diabetes mellitus without complications: Secondary | ICD-10-CM | POA: Diagnosis not present

## 2017-06-14 DIAGNOSIS — M47816 Spondylosis without myelopathy or radiculopathy, lumbar region: Secondary | ICD-10-CM

## 2017-06-14 DIAGNOSIS — D509 Iron deficiency anemia, unspecified: Secondary | ICD-10-CM

## 2017-06-14 DIAGNOSIS — M545 Low back pain, unspecified: Secondary | ICD-10-CM

## 2017-06-14 DIAGNOSIS — D649 Anemia, unspecified: Secondary | ICD-10-CM | POA: Diagnosis not present

## 2017-06-14 DIAGNOSIS — M1612 Unilateral primary osteoarthritis, left hip: Secondary | ICD-10-CM | POA: Diagnosis not present

## 2017-06-14 DIAGNOSIS — I10 Essential (primary) hypertension: Secondary | ICD-10-CM

## 2017-06-14 MED ORDER — AMLODIPINE BESYLATE 10 MG PO TABS
10.0000 mg | ORAL_TABLET | Freq: Every day | ORAL | 1 refills | Status: DC
Start: 2017-06-14 — End: 2017-08-19

## 2017-06-14 MED ORDER — FENOFIBRATE 54 MG PO TABS: 54.0000 mg | ORAL_TABLET | Freq: Every day | ORAL | 1 refills | Status: DC

## 2017-06-14 MED ORDER — METOPROLOL SUCCINATE ER 100 MG PO TB24: 100.0000 mg | ORAL_TABLET | Freq: Every day | ORAL | 1 refills | Status: DC

## 2017-06-14 MED ORDER — CELECOXIB 200 MG PO CAPS: ORAL_CAPSULE | ORAL | 1 refills | Status: DC

## 2017-06-14 MED ORDER — METFORMIN HCL 1000 MG PO TABS: 1000.0000 mg | ORAL_TABLET | Freq: Two times a day (BID) | ORAL | 1 refills | Status: DC

## 2017-06-14 MED ORDER — LOSARTAN POTASSIUM-HCTZ 100-25 MG PO TABS: 1.0000 | ORAL_TABLET | Freq: Every day | ORAL | 1 refills | Status: DC

## 2017-06-14 MED ORDER — POLYSACCHARIDE IRON COMPLEX 150 MG PO CAPS: 150.0000 mg | ORAL_CAPSULE | Freq: Every day | ORAL | 1 refills | Status: DC

## 2017-06-14 MED FILL — CELECOXIB 200 MG CAP: 200 | 30 days supply | Qty: 30 | Fill #0

## 2017-06-14 NOTE — Patient Instructions (Addendum)
You may take 500mg  Tylenol every 6 hours for pain and inflammation of your back. Do not take Celebrex daily if you do not have back or hip pain. Follow up with Dr. Alfonso Ramus for your back.   Check your blood pressure every other day for the next 4 weeks and come back to our clinic if it remains higher than 140 (top number) for systolic reading.     Hypertension Hypertension, commonly called high blood pressure, is when the force of blood pumping through the arteries is too strong. The arteries are the blood vessels that carry blood from the heart throughout the body. Hypertension forces the heart to work harder to pump blood and may cause arteries to become narrow or stiff. Having untreated or uncontrolled hypertension can cause heart attacks, strokes, kidney disease, and other problems. A blood pressure reading consists of a higher number over a lower number. Ideally, your blood pressure should be below 120/80. The first ("top") number is called the systolic pressure. It is a measure of the pressure in your arteries as your heart beats. The second ("bottom") number is called the diastolic pressure. It is a measure of the pressure in your arteries as the heart relaxes. What are the causes? The cause of this condition is not known. What increases the risk? Some risk factors for high blood pressure are under your control. Others are not. Factors you can change  Smoking.  Having type 2 diabetes mellitus, high cholesterol, or both.  Not getting enough exercise or physical activity.  Being overweight.  Having too much fat, sugar, calories, or salt (sodium) in your diet.  Drinking too much alcohol. Factors that are difficult or impossible to change  Having chronic kidney disease.  Having a family history of high blood pressure.  Age. Risk increases with age.  Race. You may be at higher risk if you are African-American.  Gender. Men are at higher risk than women before age 18. After age 23,  women are at higher risk than men.  Having obstructive sleep apnea.  Stress. What are the signs or symptoms? Extremely high blood pressure (hypertensive crisis) may cause:  Headache.  Anxiety.  Shortness of breath.  Nosebleed.  Nausea and vomiting.  Severe chest pain.  Jerky movements you cannot control (seizures).  How is this diagnosed? This condition is diagnosed by measuring your blood pressure while you are seated, with your arm resting on a surface. The cuff of the blood pressure monitor will be placed directly against the skin of your upper arm at the level of your heart. It should be measured at least twice using the same arm. Certain conditions can cause a difference in blood pressure between your right and left arms. Certain factors can cause blood pressure readings to be lower or higher than normal (elevated) for a short period of time:  When your blood pressure is higher when you are in a health care provider's office than when you are at home, this is called white coat hypertension. Most people with this condition do not need medicines.  When your blood pressure is higher at home than when you are in a health care provider's office, this is called masked hypertension. Most people with this condition may need medicines to control blood pressure.  If you have a high blood pressure reading during one visit or you have normal blood pressure with other risk factors:  You may be asked to return on a different day to have your blood pressure checked  again.  You may be asked to monitor your blood pressure at home for 1 week or longer.  If you are diagnosed with hypertension, you may have other blood or imaging tests to help your health care provider understand your overall risk for other conditions. How is this treated? This condition is treated by making healthy lifestyle changes, such as eating healthy foods, exercising more, and reducing your alcohol intake. Your health  care provider may prescribe medicine if lifestyle changes are not enough to get your blood pressure under control, and if:  Your systolic blood pressure is above 130.  Your diastolic blood pressure is above 80.  Your personal target blood pressure may vary depending on your medical conditions, your age, and other factors. Follow these instructions at home: Eating and drinking  Eat a diet that is high in fiber and potassium, and low in sodium, added sugar, and fat. An example eating plan is called the DASH (Dietary Approaches to Stop Hypertension) diet. To eat this way: ? Eat plenty of fresh fruits and vegetables. Try to fill half of your plate at each meal with fruits and vegetables. ? Eat whole grains, such as whole wheat pasta, brown rice, or whole grain bread. Fill about one quarter of your plate with whole grains. ? Eat or drink low-fat dairy products, such as skim milk or low-fat yogurt. ? Avoid fatty cuts of meat, processed or cured meats, and poultry with skin. Fill about one quarter of your plate with lean proteins, such as fish, chicken without skin, beans, eggs, and tofu. ? Avoid premade and processed foods. These tend to be higher in sodium, added sugar, and fat.  Reduce your daily sodium intake. Most people with hypertension should eat less than 1,500 mg of sodium a day.  Limit alcohol intake to no more than 1 drink a day for nonpregnant women and 2 drinks a day for men. One drink equals 12 oz of beer, 5 oz of wine, or 1 oz of hard liquor. Lifestyle  Work with your health care provider to maintain a healthy body weight or to lose weight. Ask what an ideal weight is for you.  Get at least 30 minutes of exercise that causes your heart to beat faster (aerobic exercise) most days of the week. Activities may include walking, swimming, or biking.  Include exercise to strengthen your muscles (resistance exercise), such as pilates or lifting weights, as part of your weekly exercise  routine. Try to do these types of exercises for 30 minutes at least 3 days a week.  Do not use any products that contain nicotine or tobacco, such as cigarettes and e-cigarettes. If you need help quitting, ask your health care provider.  Monitor your blood pressure at home as told by your health care provider.  Keep all follow-up visits as told by your health care provider. This is important. Medicines  Take over-the-counter and prescription medicines only as told by your health care provider. Follow directions carefully. Blood pressure medicines must be taken as prescribed.  Do not skip doses of blood pressure medicine. Doing this puts you at risk for problems and can make the medicine less effective.  Ask your health care provider about side effects or reactions to medicines that you should watch for. Contact a health care provider if:  You think you are having a reaction to a medicine you are taking.  You have headaches that keep coming back (recurring).  You feel dizzy.  You have swelling in your  ankles.  You have trouble with your vision. Get help right away if:  You develop a severe headache or confusion.  You have unusual weakness or numbness.  You feel faint.  You have severe pain in your chest or abdomen.  You vomit repeatedly.  You have trouble breathing. Summary  Hypertension is when the force of blood pumping through your arteries is too strong. If this condition is not controlled, it may put you at risk for serious complications.  Your personal target blood pressure may vary depending on your medical conditions, your age, and other factors. For most people, a normal blood pressure is less than 120/80.  Hypertension is treated with lifestyle changes, medicines, or a combination of both. Lifestyle changes include weight loss, eating a healthy, low-sodium diet, exercising more, and limiting alcohol. This information is not intended to replace advice given to you  by your health care provider. Make sure you discuss any questions you have with your health care provider. Document Released: 05/10/2005 Document Revised: 04/07/2016 Document Reviewed: 04/07/2016 Elsevier Interactive Patient Education  2018 [employer].     Diabetes Mellitus and Nutrition When you have diabetes (diabetes mellitus), it is very important to have healthy eating habits because your blood sugar (glucose) levels are greatly affected by what you eat and drink. Eating healthy foods in the appropriate amounts, at about the same times every day, can help you:  Control your blood glucose.  Lower your risk of heart disease.  Improve your blood pressure.  Reach or maintain a healthy weight.  Every person with diabetes is different, and each person has different needs for a meal plan. Your health care provider may recommend that you work with a diet and nutrition specialist (dietitian) to make a meal plan that is best for you. Your meal plan may vary depending on factors such as:  The calories you need.  The medicines you take.  Your weight.  Your blood glucose, blood pressure, and cholesterol levels.  Your activity level.  Other health conditions you have, such as heart or kidney disease.  How do carbohydrates affect me? Carbohydrates affect your blood glucose level more than any other type of food. Eating carbohydrates naturally increases the amount of glucose in your blood. Carbohydrate counting is a method for keeping track of how many carbohydrates you eat. Counting carbohydrates is important to keep your blood glucose at a healthy level, especially if you use insulin or take certain oral diabetes medicines. It is important to know how many carbohydrates you can safely have in each meal. This is different for every person. Your dietitian can help you calculate how many carbohydrates you should have at each meal and for snack. Foods that contain carbohydrates  include:  Bread, cereal, rice, pasta, and crackers.  Potatoes and corn.  Peas, beans, and lentils.  Milk and yogurt.  Fruit and juice.  Desserts, such as cakes, cookies, ice cream, and candy.  How does alcohol affect me? Alcohol can cause a sudden decrease in blood glucose (hypoglycemia), especially if you use insulin or take certain oral diabetes medicines. Hypoglycemia can be a life-threatening condition. Symptoms of hypoglycemia (sleepiness, dizziness, and confusion) are similar to symptoms of having too much alcohol. If your health care provider says that alcohol is safe for you, follow these guidelines:  Limit alcohol intake to no more than 1 drink per day for nonpregnant women and 2 drinks per day for men. One drink equals 12 oz of beer, 5 oz of wine,  or 1 oz of hard liquor.  Do not drink on an empty stomach.  Keep yourself hydrated with water, diet soda, or unsweetened iced tea.  Keep in mind that regular soda, juice, and other mixers may contain a lot of sugar and must be counted as carbohydrates.  What are tips for following this plan? Reading food labels  Start by checking the serving size on the label. The amount of calories, carbohydrates, fats, and other nutrients listed on the label are based on one serving of the food. Many foods contain more than one serving per package.  Check the total grams (g) of carbohydrates in one serving. You can calculate the number of servings of carbohydrates in one serving by dividing the total carbohydrates by 15. For example, if a food has 30 g of total carbohydrates, it would be equal to 2 servings of carbohydrates.  Check the number of grams (g) of saturated and trans fats in one serving. Choose foods that have low or no amount of these fats.  Check the number of milligrams (mg) of sodium in one serving. Most people should limit total sodium intake to less than 2,300 mg per day.  Always check the nutrition information of foods  labeled as "low-fat" or "nonfat". These foods may be higher in added sugar or refined carbohydrates and should be avoided.  Talk to your dietitian to identify your daily goals for nutrients listed on the label. Shopping  Avoid buying canned, premade, or processed foods. These foods tend to be high in fat, sodium, and added sugar.  Shop around the outside edge of the grocery store. This includes fresh fruits and vegetables, bulk grains, fresh meats, and fresh dairy. Cooking  Use low-heat cooking methods, such as baking, instead of high-heat cooking methods like deep frying.  Cook using healthy oils, such as olive, canola, or sunflower oil.  Avoid cooking with butter, cream, or high-fat meats. Meal planning  Eat meals and snacks regularly, preferably at the same times every day. Avoid going long periods of time without eating.  Eat foods high in fiber, such as fresh fruits, vegetables, beans, and whole grains. Talk to your dietitian about how many servings of carbohydrates you can eat at each meal.  Eat 4-6 ounces of lean protein each day, such as lean meat, chicken, fish, eggs, or tofu. 1 ounce is equal to 1 ounce of meat, chicken, or fish, 1 egg, or 1/4 cup of tofu.  Eat some foods each day that contain healthy fats, such as avocado, nuts, seeds, and fish. Lifestyle   Check your blood glucose regularly.  Exercise at least 30 minutes 5 or more days each week, or as told by your health care provider.  Take medicines as told by your health care provider.  Do not use any products that contain nicotine or tobacco, such as cigarettes and e-cigarettes. If you need help quitting, ask your health care provider.  Work with a Social worker or diabetes educator to identify strategies to manage stress and any emotional and social challenges. What are some questions to ask my health care provider?  Do I need to meet with a diabetes educator?  Do I need to meet with a dietitian?  What number  can I call if I have questions?  When are the best times to check my blood glucose? Where to find more information:  American Diabetes Association: diabetes.org/food-and-fitness/food  Academy of Nutrition and Dietetics: PokerClues.dk  [employer] of Diabetes and Digestive and Kidney Diseases (NIH):  ContactWire.be Summary  A healthy meal plan will help you control your blood glucose and maintain a healthy lifestyle.  Working with a diet and nutrition specialist (dietitian) can help you make a meal plan that is best for you.  Keep in mind that carbohydrates and alcohol have immediate effects on your blood glucose levels. It is important to count carbohydrates and to use alcohol carefully. This information is not intended to replace advice given to you by your health care provider. Make sure you discuss any questions you have with your health care provider. Document Released: 02/04/2005 Document Revised: 06/14/2016 Document Reviewed: 06/14/2016 Elsevier Interactive Patient Education  2018 [employer].     IF you received an x-ray today, you will receive an invoice from [facility]. Please contact [facility] at [phone] with questions or concerns regarding your invoice.   IF you received labwork today, you will receive an invoice from [place]. Please contact [facility] at [phone] with questions or concerns regarding your invoice.   Our billing staff will not be able to assist you with questions regarding bills from these companies.  You will be contacted with the lab results as soon as they are available. The fastest way to get your results is to activate your My Chart account. Instructions are located on the last page of this paperwork. If you have not heard from [place] regarding the results in 2 weeks, please contact this office.

## 2017-06-14 NOTE — Progress Notes (Signed)
MRN: [id number] DOB: [birth year]  Subjective:   [name] is a 61 y.o. female presenting for follow up on Hypertension.   HTN - Currently managed with amlodipine, HCTZ, losartan, metoprolol. Patient is not checking blood pressure at home. Admits that diet has been non-compliant and patient has not had 1 of her blood pressure medications this past week. Denies dizziness, chronic headache, blurred vision, chest pain, shortness of breath, heart racing, palpitations, nausea, vomiting, abdominal pain, hematuria, lower leg swelling. Denies smoking cigarettes or drinking alcohol.   DM - Managed with Metformin. Does not check her blood sugar at home. Diet is non-compliant. Has not seen her eye doctor in ~2 years ago. Admits intermittent polydipsia, polyuria. Checks feet daily. Denies foot concerns.   Lumbar spondylosis - Saw Dr. Alfonso Ramus ~2 weeks ago. He would like to do an MRI of lumbar spine to look for slipped disc. She tried Ultram but was not able to tolerate this. She is currently using Flexeril, Celebrex daily for pain control. Has previously failed course of meloxicam. Also takes APAP regularly.   HL - Cannot tolerate statin due to myalgia.   [name] has a current medication list which includes the following prescription(s): albuterol, amlodipine, atorvastatin, celecoxib, cetirizine, cyclobenzaprine, fluticasone, iron polysaccharides, lansoprazole, losartan-hydrochlorothiazide, metformin, and metoprolol succinate. Also is allergic to sulfa antibiotics.  [name]  has a past medical history of Anemia, Arthritis, DM type 2 (diabetes mellitus, type 2) ([place]), Heart murmur, HTN (hypertension), Hypercholesterolemia, and Reflux. Also  has a past surgical history that includes Cesarean section and Tubal ligation.  Objective:   Vitals: BP (!) 178/102   Pulse (!) 121   Temp 98.2 F (36.8 C) (Oral)   Resp 18   Ht 4\' 10"  (1.473 m)   Wt 203 lb (92.1 kg)   SpO2 98%   BMI 42.43 kg/m   BP  Readings from Last 3 Encounters:  06/14/17 (!) 178/102  05/06/17 132/80  02/28/17 (!) 146/80    The 10-year ASCVD risk score Mikey Bussing DC Jr., et al., 2013) is: 40%   Values used to calculate the score:     Age: 69 years     Sex: Female     Is Non-Hispanic African American: Yes     Diabetic: Yes     Tobacco smoker: No     Systolic Blood Pressure: 409 mmHg     Is BP treated: Yes     HDL Cholesterol: 46 mg/dL     Total Cholesterol: 205 mg/dL  Physical Exam  Constitutional: She is oriented to person, place, and time. She appears well-developed and well-nourished.  HENT:  Mouth/Throat: Oropharynx is clear and moist.  Eyes: EOM are normal. Pupils are equal, round, and reactive to light. No scleral icterus.  Neck: Normal range of motion. Neck supple. No thyromegaly present.  Cardiovascular: Normal rate, regular rhythm and intact distal pulses. Exam reveals no gallop and no friction rub.  No murmur heard. Pulmonary/Chest: No respiratory distress. She has no wheezes. She has no rales.  Musculoskeletal: She exhibits no edema.  Neurological: She is alert and oriented to person, place, and time.  Skin: Skin is warm and dry.  Psychiatric: She has a normal mood and affect.   Assessment and Plan :   Essential hypertension - Plan: Comprehensive metabolic panel, Microalbumin / creatinine urine ratio, amLODipine (NORVASC) 10 MG tablet, metoprolol succinate (TOPROL-XL) 100 MG 24 hr tablet, losartan-hydrochlorothiazide (HYZAAR) 100-25 MG tablet  Controlled type 2 diabetes mellitus without complication, without long-term current  use of insulin ([place]) - Plan: Comprehensive metabolic panel, Hemoglobin A1c  Type 2 diabetes mellitus without complication, without long-term current use of insulin ([place]) - Plan: metFORMIN (GLUCOPHAGE) 1000 MG tablet  Anemia, unspecified type - Plan: CBC with Differential/Platelet, Ferritin, Pathologist smear review  Iron deficiency anemia, unspecified iron deficiency anemia  type - Plan: iron polysaccharides (NIFEREX) 150 MG capsule  Osteoarthritis of left hip, unspecified osteoarthritis type - Plan: celecoxib (CELEBREX) 200 MG capsule  Chronic bilateral low back pain without sciatica - Plan: celecoxib (CELEBREX) 200 MG capsule  Spondylosis of lumbar region without myelopathy or radiculopathy - Plan: celecoxib (CELEBREX) 200 MG capsule  Refilled all medications. Patient was upset that she is here requesting medication refills for HTN, DM, HL. I counseled that when she was here in 02/2017 and 04/2017, she was here for back pain and tinnitus respectively. I reviewed medications and medication refills and assured her that we are filling medications for 6 months which is the standard of care. I counseled on need for dietary modifications. Refills provided for all medications. She is to continue working with Dr. Alfonso Ramus on her back pain. I counseled against using Celebrex daily. Counseled patient on potential for adverse effects with medications prescribed today, patient verbalized understanding. Follow up for HTN in 4 months, if BP remains above 601'V systolic. Follow up for diabetes in 3 months if a1c is >8%. Otherwise, follow up in 6 months.  [name], PA-C [facility] [phone] 06/14/2017  2:23 PM

## 2017-06-15 LAB — COMPREHENSIVE METABOLIC PANEL
ALT: 14 IU/L (ref 0–32)
AST: 15 IU/L (ref 0–40)
Albumin/Globulin Ratio: 1.8 (ref 1.2–2.2)
Albumin: 5.1 g/dL — ABNORMAL HIGH (ref 3.6–4.8)
Alkaline Phosphatase: 71 IU/L (ref 39–117)
BUN/Creatinine Ratio: 23 (ref 12–28)
BUN: 19 mg/dL (ref 8–27)
Bilirubin Total: <0.2 mg/dL (ref 0.0–1.2)
CO2: 26 mmol/L (ref 20–29)
Calcium: 10.7 mg/dL — ABNORMAL HIGH (ref 8.7–10.3)
Chloride: 97 mmol/L (ref 96–106)
Creatinine, Ser: 0.82 mg/dL (ref 0.57–1.00)
GFR calc Af Amer: 90 mL/min/{1.73_m2} (ref >59)
GFR calc non Af Amer: 78 mL/min/{1.73_m2} (ref >59)
Globulin, Total: 2.9 g/dL (ref 1.5–4.5)
Glucose: 109 mg/dL — ABNORMAL HIGH (ref 65–99)
Potassium: 4 mmol/L (ref 3.5–5.2)
Sodium: 141 mmol/L (ref 134–144)
Total Protein: 8 g/dL (ref 6.0–8.5)

## 2017-06-15 LAB — CBC WITH DIFFERENTIAL/PLATELET
Basophils Absolute: 0 10*3/uL (ref 0.0–0.2)
Basos: 1 %
EOS (ABSOLUTE): 0.2 10*3/uL (ref 0.0–0.4)
Eos: 3 %
Hematocrit: 38.8 % (ref 34.0–46.6)
Hemoglobin: 12.5 g/dL (ref 11.1–15.9)
Immature Grans (Abs): 0.1 10*3/uL (ref 0.0–0.1)
Immature Granulocytes: 1 %
Lymphocytes Absolute: 2.7 10*3/uL (ref 0.7–3.1)
Lymphs: 36 %
MCH: 24.3 pg — ABNORMAL LOW (ref 26.6–33.0)
MCHC: 32.2 g/dL (ref 31.5–35.7)
MCV: 75 fL — ABNORMAL LOW (ref 79–97)
Monocytes Absolute: 0.5 10*3/uL (ref 0.1–0.9)
Monocytes: 7 %
Neutrophils Absolute: 4 10*3/uL (ref 1.4–7.0)
Neutrophils: 52 %
Platelets: 386 10*3/uL — ABNORMAL HIGH (ref 150–379)
RBC: 5.15 x10E6/uL (ref 3.77–5.28)
RDW: 17.9 % — ABNORMAL HIGH (ref 12.3–15.4)
WBC: 7.6 10*3/uL (ref 3.4–10.8)

## 2017-06-15 LAB — MICROALBUMIN / CREATININE URINE RATIO
Creatinine, Urine: 217.2 mg/dL
Microalb/Creat Ratio: 229.9 mg/g creat — ABNORMAL HIGH (ref 0.0–30.0)
Microalbumin, Urine: 499.4 ug/mL

## 2017-06-15 LAB — FERRITIN: Ferritin: 90 ng/mL (ref 15–150)

## 2017-06-15 LAB — HEMOGLOBIN A1C
Est. average glucose Bld gHb Est-mCnc: 160 mg/dL
Hgb A1c MFr Bld: 7.2 % — ABNORMAL HIGH (ref 4.8–5.6)

## 2017-06-15 LAB — HGB SOLU + RFLX FRAC: Sickle Solubility Test - HGBRFX: NEGATIVE

## 2017-06-17 MED FILL — FENOFIBRATE 54 MG TABLET: 54 | 90 days supply | Qty: 90 | Fill #0

## 2017-06-20 MED FILL — CYCLOBENZAPRINE 5 MG TABLET: 5 | 30 days supply | Qty: 90 | Fill #1

## 2017-07-11 ENCOUNTER — Telehealth: Payer: Self-pay | Admitting: Urgent Care

## 2017-07-11 MED FILL — POLY-IRON 150 MG CAPSULE: 150 | 30 days supply | Qty: 30 | Fill #7

## 2017-07-11 MED FILL — CELECOXIB 200 MG CAP: 200 | 30 days supply | Qty: 30 | Fill #1

## 2017-07-11 NOTE — Telephone Encounter (Signed)
Copied from [place] [id number]. Topic: Quick Communication - See Telephone Encounter >> Jul 11, 2017 10:06 AM Synthia Innocent wrote: CRM for notification. See Telephone encounter for:  Patient is going to have some laser hair removal, is this ok with meds? Please advise 07/11/17.

## 2017-07-13 NOTE — Telephone Encounter (Signed)
Call to pt.  Advised that some medications can make patients more sensitive to UV light and recommend that she consult with pharmacist as UV sensitivity could be a lesser known side effect of some medications.  Also recommend that she provide her laser hair removal consultant with a list of her current medications. Pt denies further questions at this time.

## 2017-07-27 MED FILL — GABAPENTIN 300 MG CAPSULE: 300 | 30 days supply | Qty: 90 | Fill #3

## 2017-08-08 ENCOUNTER — Other Ambulatory Visit: Payer: Self-pay | Admitting: Urgent Care

## 2017-08-08 DIAGNOSIS — M47816 Spondylosis without myelopathy or radiculopathy, lumbar region: Secondary | ICD-10-CM

## 2017-08-08 DIAGNOSIS — M545 Low back pain, unspecified: Secondary | ICD-10-CM

## 2017-08-08 DIAGNOSIS — G8929 Other chronic pain: Secondary | ICD-10-CM

## 2017-08-08 DIAGNOSIS — M1612 Unilateral primary osteoarthritis, left hip: Secondary | ICD-10-CM

## 2017-08-16 ENCOUNTER — Other Ambulatory Visit: Payer: Self-pay | Admitting: Urgent Care

## 2017-08-16 DIAGNOSIS — G8929 Other chronic pain: Secondary | ICD-10-CM

## 2017-08-16 DIAGNOSIS — M545 Low back pain, unspecified: Secondary | ICD-10-CM

## 2017-08-16 DIAGNOSIS — M47816 Spondylosis without myelopathy or radiculopathy, lumbar region: Secondary | ICD-10-CM

## 2017-08-16 DIAGNOSIS — M1612 Unilateral primary osteoarthritis, left hip: Secondary | ICD-10-CM

## 2017-08-16 NOTE — Telephone Encounter (Signed)
Refill of Celebrex  LOV 06/14/17  [place], [place] - [address]

## 2017-08-19 ENCOUNTER — Other Ambulatory Visit: Payer: Self-pay | Admitting: Urgent Care

## 2017-08-19 ENCOUNTER — Ambulatory Visit: Payer: BLUE CROSS/BLUE SHIELD | Admitting: Urgent Care

## 2017-08-19 ENCOUNTER — Encounter: Payer: Self-pay | Admitting: Urgent Care

## 2017-08-19 VITALS — BP 190/90 | HR 98 | Temp 98.3°F | Resp 18 | Ht <= 58 in | Wt 205.8 lb

## 2017-08-19 DIAGNOSIS — I10 Essential (primary) hypertension: Secondary | ICD-10-CM | POA: Diagnosis not present

## 2017-08-19 DIAGNOSIS — Z1211 Encounter for screening for malignant neoplasm of colon: Secondary | ICD-10-CM | POA: Diagnosis not present

## 2017-08-19 DIAGNOSIS — M47816 Spondylosis without myelopathy or radiculopathy, lumbar region: Secondary | ICD-10-CM

## 2017-08-19 DIAGNOSIS — J4520 Mild intermittent asthma, uncomplicated: Secondary | ICD-10-CM

## 2017-08-19 DIAGNOSIS — Z1159 Encounter for screening for other viral diseases: Secondary | ICD-10-CM | POA: Diagnosis not present

## 2017-08-19 DIAGNOSIS — M545 Low back pain, unspecified: Secondary | ICD-10-CM

## 2017-08-19 DIAGNOSIS — Z114 Encounter for screening for human immunodeficiency virus [HIV]: Secondary | ICD-10-CM | POA: Diagnosis not present

## 2017-08-19 DIAGNOSIS — M1612 Unilateral primary osteoarthritis, left hip: Secondary | ICD-10-CM

## 2017-08-19 DIAGNOSIS — G8929 Other chronic pain: Secondary | ICD-10-CM

## 2017-08-19 DIAGNOSIS — K219 Gastro-esophageal reflux disease without esophagitis: Secondary | ICD-10-CM | POA: Diagnosis not present

## 2017-08-19 DIAGNOSIS — Z1231 Encounter for screening mammogram for malignant neoplasm of breast: Secondary | ICD-10-CM | POA: Diagnosis not present

## 2017-08-19 DIAGNOSIS — E119 Type 2 diabetes mellitus without complications: Secondary | ICD-10-CM

## 2017-08-19 DIAGNOSIS — Z1239 Encounter for other screening for malignant neoplasm of breast: Secondary | ICD-10-CM

## 2017-08-19 MED ORDER — METFORMIN HCL 1000 MG PO TABS: 1000.0000 mg | ORAL_TABLET | Freq: Two times a day (BID) | ORAL | 1 refills | Status: DC

## 2017-08-19 MED ORDER — FENOFIBRATE 54 MG PO TABS: 54.0000 mg | ORAL_TABLET | Freq: Every day | ORAL | 1 refills | Status: DC

## 2017-08-19 MED ORDER — LOSARTAN POTASSIUM-HCTZ 100-25 MG PO TABS: 1.0000 | ORAL_TABLET | Freq: Every day | ORAL | 1 refills | Status: DC

## 2017-08-19 MED ORDER — METOPROLOL SUCCINATE ER 100 MG PO TB24: 100.0000 mg | ORAL_TABLET | Freq: Every day | ORAL | 1 refills | Status: DC

## 2017-08-19 MED ORDER — LANSOPRAZOLE 30 MG PO CPDR
30.0000 mg | DELAYED_RELEASE_CAPSULE | Freq: Every day | ORAL | 1 refills | Status: DC
Start: 2017-08-19 — End: 2017-11-23

## 2017-08-19 MED ORDER — AMLODIPINE BESYLATE 10 MG PO TABS: 10.0000 mg | ORAL_TABLET | Freq: Every day | ORAL | 1 refills | Status: DC

## 2017-08-19 MED ORDER — ACETAMINOPHEN-CODEINE #3 300-30 MG PO TABS: 1.0000 | ORAL_TABLET | Freq: Three times a day (TID) | ORAL | 0 refills | Status: DC | PRN

## 2017-08-19 MED FILL — ACETAMINOPHEN/COD #3 TABLET: 300-30 | 7 days supply | Qty: 21 | Fill #0

## 2017-08-19 NOTE — Telephone Encounter (Signed)
Refill of flexeril  LOV 08/19/17  [place], [place] - [address]

## 2017-08-19 NOTE — Patient Instructions (Addendum)
You may take 500mg  Tylenol every 6 hours for pain and inflammation. Use Tylenol #3 only for breakthrough pain.     Salads - kale, spinach, cabbage, spring mix; use seeds like pumpkin seeds or sunflower seeds; you can also use 1-2 hard boiled eggs. Fruits - avocadoes, berries (blueberries, raspberries, blackberries), apples, oranges, pomegranate, grapefruit Seeds - quinoa, chia seeds; you can also incorporate oatmeal Vegetables - aspargus, cauliflower, broccoli, green beans, brussel spouts, bell peppers; stay away from starchy vegetables like potatoes, carrots, peas    Diabetes Mellitus and Nutrition When you have diabetes (diabetes mellitus), it is very important to have healthy eating habits because your blood sugar (glucose) levels are greatly affected by what you eat and drink. Eating healthy foods in the appropriate amounts, at about the same times every day, can help you:  Control your blood glucose.  Lower your risk of heart disease.  Improve your blood pressure.  Reach or maintain a healthy weight.  Every person with diabetes is different, and each person has different needs for a meal plan. Your health care provider may recommend that you work with a diet and nutrition specialist (dietitian) to make a meal plan that is best for you. Your meal plan may vary depending on factors such as:  The calories you need.  The medicines you take.  Your weight.  Your blood glucose, blood pressure, and cholesterol levels.  Your activity level.  Other health conditions you have, such as heart or kidney disease.  How do carbohydrates affect me? Carbohydrates affect your blood glucose level more than any other type of food. Eating carbohydrates naturally increases the amount of glucose in your blood. Carbohydrate counting is a method for keeping track of how many carbohydrates you eat. Counting carbohydrates is important to keep your blood glucose at a healthy level, especially if you use  insulin or take certain oral diabetes medicines. It is important to know how many carbohydrates you can safely have in each meal. This is different for every person. Your dietitian can help you calculate how many carbohydrates you should have at each meal and for snack. Foods that contain carbohydrates include:  Bread, cereal, rice, pasta, and crackers.  Potatoes and corn.  Peas, beans, and lentils.  Milk and yogurt.  Fruit and juice.  Desserts, such as cakes, cookies, ice cream, and candy.  How does alcohol affect me? Alcohol can cause a sudden decrease in blood glucose (hypoglycemia), especially if you use insulin or take certain oral diabetes medicines. Hypoglycemia can be a life-threatening condition. Symptoms of hypoglycemia (sleepiness, dizziness, and confusion) are similar to symptoms of having too much alcohol. If your health care provider says that alcohol is safe for you, follow these guidelines:  Limit alcohol intake to no more than 1 drink per day for nonpregnant women and 2 drinks per day for men. One drink equals 12 oz of beer, 5 oz of wine, or 1 oz of hard liquor.  Do not drink on an empty stomach.  Keep yourself hydrated with water, diet soda, or unsweetened iced tea.  Keep in mind that regular soda, juice, and other mixers may contain a lot of sugar and must be counted as carbohydrates.  What are tips for following this plan? Reading food labels  Start by checking the serving size on the label. The amount of calories, carbohydrates, fats, and other nutrients listed on the label are based on one serving of the food. Many foods contain more than one serving per package.  Check the total grams (g) of carbohydrates in one serving. You can calculate the number of servings of carbohydrates in one serving by dividing the total carbohydrates by 15. For example, if a food has 30 g of total carbohydrates, it would be equal to 2 servings of carbohydrates.  Check the number  of grams (g) of saturated and trans fats in one serving. Choose foods that have low or no amount of these fats.  Check the number of milligrams (mg) of sodium in one serving. Most people should limit total sodium intake to less than 2,300 mg per day.  Always check the nutrition information of foods labeled as "low-fat" or "nonfat". These foods may be higher in added sugar or refined carbohydrates and should be avoided.  Talk to your dietitian to identify your daily goals for nutrients listed on the label. Shopping  Avoid buying canned, premade, or processed foods. These foods tend to be high in fat, sodium, and added sugar.  Shop around the outside edge of the grocery store. This includes fresh fruits and vegetables, bulk grains, fresh meats, and fresh dairy. Cooking  Use low-heat cooking methods, such as baking, instead of high-heat cooking methods like deep frying.  Cook using healthy oils, such as olive, canola, or sunflower oil.  Avoid cooking with butter, cream, or high-fat meats. Meal planning  Eat meals and snacks regularly, preferably at the same times every day. Avoid going long periods of time without eating.  Eat foods high in fiber, such as fresh fruits, vegetables, beans, and whole grains. Talk to your dietitian about how many servings of carbohydrates you can eat at each meal.  Eat 4-6 ounces of lean protein each day, such as lean meat, chicken, fish, eggs, or tofu. 1 ounce is equal to 1 ounce of meat, chicken, or fish, 1 egg, or 1/4 cup of tofu.  Eat some foods each day that contain healthy fats, such as avocado, nuts, seeds, and fish. Lifestyle   Check your blood glucose regularly.  Exercise at least 30 minutes 5 or more days each week, or as told by your health care provider.  Take medicines as told by your health care provider.  Do not use any products that contain nicotine or tobacco, such as cigarettes and e-cigarettes. If you need help quitting, ask your  health care provider.  Work with a Social worker or diabetes educator to identify strategies to manage stress and any emotional and social challenges. What are some questions to ask my health care provider?  Do I need to meet with a diabetes educator?  Do I need to meet with a dietitian?  What number can I call if I have questions?  When are the best times to check my blood glucose? Where to find more information:  American Diabetes Association: diabetes.org/food-and-fitness/food  Academy of Nutrition and Dietetics: PokerClues.dk  [employer] of Diabetes and Digestive and Kidney Diseases (NIH): ContactWire.be Summary  A healthy meal plan will help you control your blood glucose and maintain a healthy lifestyle.  Working with a diet and nutrition specialist (dietitian) can help you make a meal plan that is best for you.  Keep in mind that carbohydrates and alcohol have immediate effects on your blood glucose levels. It is important to count carbohydrates and to use alcohol carefully. This information is not intended to replace advice given to you by your health care provider. Make sure you discuss any questions you have with your health care provider. Document Released: 02/04/2005 Document Revised:  06/14/2016 Document Reviewed: 06/14/2016 Elsevier Interactive Patient Education  2018 [employer].   Hypertension Hypertension is another name for high blood pressure. High blood pressure forces your heart to work harder to pump blood. This can cause problems over time. There are two numbers in a blood pressure reading. There is a top number (systolic) over a bottom number (diastolic). It is best to have a blood pressure below 120/80. Healthy choices can help lower your blood pressure. You may need medicine to help lower your blood pressure if:  Your blood  pressure cannot be lowered with healthy choices.  Your blood pressure is higher than 130/80.  Follow these instructions at home: Eating and drinking  If directed, follow the DASH eating plan. This diet includes: ? Filling half of your plate at each meal with fruits and vegetables. ? Filling one quarter of your plate at each meal with whole grains. Whole grains include whole wheat pasta, brown rice, and whole grain bread. ? Eating or drinking low-fat dairy products, such as skim milk or low-fat yogurt. ? Filling one quarter of your plate at each meal with low-fat (lean) proteins. Low-fat proteins include fish, skinless chicken, eggs, beans, and tofu. ? Avoiding fatty meat, cured and processed meat, or chicken with skin. ? Avoiding premade or processed food.  Eat less than 1,500 mg of salt (sodium) a day.  Limit alcohol use to no more than 1 drink a day for nonpregnant women and 2 drinks a day for men. One drink equals 12 oz of beer, 5 oz of wine, or 1 oz of hard liquor. Lifestyle  Work with your doctor to stay at a healthy weight or to lose weight. Ask your doctor what the best weight is for you.  Get at least 30 minutes of exercise that causes your heart to beat faster (aerobic exercise) most days of the week. This may include walking, swimming, or biking.  Get at least 30 minutes of exercise that strengthens your muscles (resistance exercise) at least 3 days a week. This may include lifting weights or pilates.  Do not use any products that contain nicotine or tobacco. This includes cigarettes and e-cigarettes. If you need help quitting, ask your doctor.  Check your blood pressure at home as told by your doctor.  Keep all follow-up visits as told by your doctor. This is important. Medicines  Take over-the-counter and prescription medicines only as told by your doctor. Follow directions carefully.  Do not skip doses of blood pressure medicine. The medicine does not work as well if  you skip doses. Skipping doses also puts you at risk for problems.  Ask your doctor about side effects or reactions to medicines that you should watch for. Contact a doctor if:  You think you are having a reaction to the medicine you are taking.  You have headaches that keep coming back (recurring).  You feel dizzy.  You have swelling in your ankles.  You have trouble with your vision. Get help right away if:  You get a very bad headache.  You start to feel confused.  You feel weak or numb.  You feel faint.  You get very bad pain in your: ? Chest. ? Belly (abdomen).  You throw up (vomit) more than once.  You have trouble breathing. Summary  Hypertension is another name for high blood pressure.  Making healthy choices can help lower blood pressure. If your blood pressure cannot be controlled with healthy choices, you may need to take medicine.  This information is not intended to replace advice given to you by your health care provider. Make sure you discuss any questions you have with your health care provider. Document Released: 10/27/2007 Document Revised: 04/07/2016 Document Reviewed: 04/07/2016 Elsevier Interactive Patient Education  2018 [employer].     IF you received an x-ray today, you will receive an invoice from [facility]. Please contact [facility] at [phone] with questions or concerns regarding your invoice.   IF you received labwork today, you will receive an invoice from [facility]. Please contact [facility] at [phone] with questions or concerns regarding your invoice.   Our billing staff will not be able to assist you with questions regarding bills from these companies.  You will be contacted with the lab results as soon as they are available. The fastest way to get your results is to activate your My Chart account. Instructions are located on the last page of this paperwork. If you have not heard from [place] regarding the  results in 2 weeks, please contact this office.

## 2017-08-19 NOTE — Progress Notes (Signed)
MRN: [id number] DOB: [birth year]  Subjective:   [name] is a 62 y.o. female presenting for follow up on Hypertension. Currently managed with losartan, HCTZ, amlodipine, metoprolol. Patient is checking blood pressure at home occasionally, generally [id number]'B systolic. Does not avoid salt in her diet, is not exercising due to her chronic back pain. She is working with an Secondary school teacher for this. Has a f/u appointment soon in April 2019. Denies dizziness, chronic headaches, chest pain, shortness of breath, heart racing, palpitations, nausea, vomiting, abdominal pain, hematuria, lower leg swelling. Denies smoking cigarettes or drinking alcohol.   [name] has a current medication list which includes the following prescription(s): albuterol, amlodipine, celecoxib, cetirizine, fenofibrate, fluticasone, iron polysaccharides, lansoprazole, losartan-hydrochlorothiazide, metformin, and metoprolol succinate. Also is allergic to atorvastatin and sulfa antibiotics.  [name]  has a past medical history of Anemia, Arthritis, DM type 2 (diabetes mellitus, type 2) ([place]), Heart murmur, HTN (hypertension), Hypercholesterolemia, and Reflux. Also  has a past surgical history that includes Cesarean section and Tubal ligation.  Objective:   Vitals: BP (!) 168/77   Pulse 98   Temp 98.3 F (36.8 C) (Oral)   Resp 18   Ht 4\' 10"  (1.473 m)   Wt 205 lb 12.8 oz (93.4 kg)   SpO2 98%   BMI 43.01 kg/m   BP Readings from Last 3 Encounters:  08/19/17 (!) 168/77  06/14/17 (!) 178/102  05/06/17 132/80    Physical Exam  Constitutional: She is oriented to person, place, and time. She appears well-developed and well-nourished.  HENT:  Mouth/Throat: Oropharynx is clear and moist.  Neck: Normal range of motion. Neck supple.  Cardiovascular: Normal rate, regular rhythm and intact distal pulses. Exam reveals no gallop and no friction rub.  No murmur heard. Pulmonary/Chest: No respiratory distress. She has no  wheezes. She has no rales.  Neurological: She is alert and oriented to person, place, and time.  Skin: Skin is warm and dry.  Psychiatric: She has a normal mood and affect.   Diabetic Foot Exam - Simple   Simple Foot Form Diabetic Foot exam was performed with the following findings:  Yes 08/19/2017  2:59 PM  Visual Inspection No deformities, no ulcerations, no other skin breakdown bilaterally:  Yes Sensation Testing Intact to touch and monofilament testing bilaterally:  Yes Pulse Check Posterior Tibialis and Dorsalis pulse intact bilaterally:  Yes Comments     Assessment and Plan :   Essential hypertension - Plan: amLODipine (NORVASC) 10 MG tablet, losartan-hydrochlorothiazide (HYZAAR) 100-25 MG tablet, metoprolol succinate (TOPROL-XL) 100 MG 24 hr tablet  Controlled type 2 diabetes mellitus without complication, without long-term current use of insulin ([place]) - Plan: Comprehensive metabolic panel, HM Diabetes Foot Exam  Encounter for hepatitis C screening test for low risk patient - Plan: Hepatitis C antibody  Screening for HIV (human immunodeficiency virus) - Plan: HIV antibody  Screening for breast cancer - Plan: MM Digital Screening  Screen for colon cancer - Plan: Ambulatory referral to Gastroenterology  Spondylosis of lumbar region without myelopathy or radiculopathy  Chronic bilateral low back pain without sciatica  Type 2 diabetes mellitus without complication, without long-term current use of insulin ([place]) - Plan: metFORMIN (GLUCOPHAGE) 1000 MG tablet  Gastroesophageal reflux disease without esophagitis - Plan: lansoprazole (PREVACID) 30 MG capsule  Asthma  Counseled patient on serious need for dietary changes, specifically avoiding salt and doing exercise. She is to stop using NSAID therapy given her risk with uncontrolled HTN. Will schedule APAP for her back pain and  use Tylenol #3 for breakthrough pain. Maintain follow up with her orthopedist. Counseled patient on  potential for adverse effects with medications prescribed today, patient verbalized understanding. Return-to-clinic precautions discussed, patient verbalized understanding. Otherwise, f/u in 4 weeks for her HTN.  [name], PA-C [facility] [phone] 08/19/2017  2:46 PM

## 2017-08-20 LAB — COMPREHENSIVE METABOLIC PANEL
ALT: 22 IU/L (ref 0–32)
AST: 20 IU/L (ref 0–40)
Albumin/Globulin Ratio: 1.7 (ref 1.2–2.2)
Albumin: 4.8 g/dL (ref 3.6–4.8)
Alkaline Phosphatase: 60 IU/L (ref 39–117)
BUN/Creatinine Ratio: 17 (ref 12–28)
BUN: 17 mg/dL (ref 8–27)
Bilirubin Total: <0.2 mg/dL (ref 0.0–1.2)
CO2: 25 mmol/L (ref 20–29)
Calcium: 10.4 mg/dL — ABNORMAL HIGH (ref 8.7–10.3)
Chloride: 102 mmol/L (ref 96–106)
Creatinine, Ser: 1.01 mg/dL — ABNORMAL HIGH (ref 0.57–1.00)
GFR calc Af Amer: 70 mL/min/{1.73_m2} (ref >59)
GFR calc non Af Amer: 61 mL/min/{1.73_m2} (ref >59)
Globulin, Total: 2.9 g/dL (ref 1.5–4.5)
Glucose: 151 mg/dL — ABNORMAL HIGH (ref 65–99)
Potassium: 3.9 mmol/L (ref 3.5–5.2)
Sodium: 144 mmol/L (ref 134–144)
Total Protein: 7.7 g/dL (ref 6.0–8.5)

## 2017-08-20 LAB — HEPATITIS C ANTIBODY: Hep C Virus Ab: <0.1 s/co ratio (ref 0.0–0.9)

## 2017-08-20 LAB — HIV ANTIBODY (ROUTINE TESTING W REFLEX): HIV Screen 4th Generation wRfx: NONREACTIVE

## 2017-08-22 ENCOUNTER — Other Ambulatory Visit: Payer: Self-pay | Admitting: Urgent Care

## 2017-08-22 DIAGNOSIS — G8929 Other chronic pain: Secondary | ICD-10-CM

## 2017-08-22 DIAGNOSIS — M47816 Spondylosis without myelopathy or radiculopathy, lumbar region: Secondary | ICD-10-CM

## 2017-08-22 DIAGNOSIS — M545 Low back pain, unspecified: Secondary | ICD-10-CM

## 2017-08-22 DIAGNOSIS — M1612 Unilateral primary osteoarthritis, left hip: Secondary | ICD-10-CM

## 2017-08-23 ENCOUNTER — Encounter: Payer: Self-pay | Admitting: *Deleted

## 2017-08-24 MED FILL — CELECOXIB 200 MG CAP: 200 | 30 days supply | Qty: 60 | Fill #0

## 2017-08-25 DIAGNOSIS — L6681 Central centrifugal cicatricial alopecia: Secondary | ICD-10-CM | POA: Insufficient documentation

## 2017-08-25 DIAGNOSIS — L089 Local infection of the skin and subcutaneous tissue, unspecified: Secondary | ICD-10-CM | POA: Insufficient documentation

## 2017-08-25 MED FILL — CLOBETASOL PROPIONATE 0.05: 0.05 | 30 days supply | Qty: 60 | Fill #0

## 2017-08-26 MED FILL — LOSARTAN-HCTZ 100-25 MG TAB: 100-25 | 90 days supply | Qty: 90 | Fill #0

## 2017-08-26 MED FILL — POLY-IRON 150 MG CAPSULE: 150 | 30 days supply | Qty: 30 | Fill #8

## 2017-08-26 MED FILL — METOPROLOL SUCCINATE ER 100: 100 | 90 days supply | Qty: 90 | Fill #0

## 2017-09-08 MED FILL — metFORMIN HCL 1000 MG TABS: 1000 | 90 days supply | Qty: 180 | Fill #1

## 2017-09-08 MED FILL — AMLODIPINE BESYLATE 10 MG T: 10 | 90 days supply | Qty: 90 | Fill #0

## 2017-09-12 MED FILL — LANSOPRAZOLE DR 30 MG CAP: 30 | 90 days supply | Qty: 90 | Fill #1

## 2017-09-30 ENCOUNTER — Other Ambulatory Visit: Payer: Self-pay

## 2017-09-30 ENCOUNTER — Encounter: Payer: Self-pay | Admitting: Urgent Care

## 2017-09-30 ENCOUNTER — Ambulatory Visit (INDEPENDENT_AMBULATORY_CARE_PROVIDER_SITE_OTHER): Payer: BLUE CROSS/BLUE SHIELD | Admitting: Urgent Care

## 2017-09-30 VITALS — BP 142/82 | HR 87 | Temp 98.0°F | Resp 16 | Ht 58.27 in | Wt 205.0 lb

## 2017-09-30 DIAGNOSIS — Z0000 Encounter for general adult medical examination without abnormal findings: Secondary | ICD-10-CM

## 2017-09-30 DIAGNOSIS — D473 Essential (hemorrhagic) thrombocythemia: Secondary | ICD-10-CM

## 2017-09-30 DIAGNOSIS — D75839 Thrombocytosis, unspecified: Secondary | ICD-10-CM

## 2017-09-30 DIAGNOSIS — E119 Type 2 diabetes mellitus without complications: Secondary | ICD-10-CM

## 2017-09-30 DIAGNOSIS — E782 Mixed hyperlipidemia: Secondary | ICD-10-CM

## 2017-09-30 DIAGNOSIS — Z111 Encounter for screening for respiratory tuberculosis: Secondary | ICD-10-CM

## 2017-09-30 DIAGNOSIS — I10 Essential (primary) hypertension: Secondary | ICD-10-CM

## 2017-09-30 DIAGNOSIS — Z130 Encounter for screening for diseases of the blood and blood-forming organs and certain disorders involving the immune mechanism: Secondary | ICD-10-CM

## 2017-09-30 DIAGNOSIS — I8290 Acute embolism and thrombosis of unspecified vein: Secondary | ICD-10-CM

## 2017-09-30 MED FILL — POLY-IRON 150 MG CAPSULE: 150 | 30 days supply | Qty: 30 | Fill #9

## 2017-09-30 NOTE — Patient Instructions (Addendum)
Hydrate well with at least 2 liters (1 gallon) of water daily.     Health Maintenance, Female Adopting a healthy lifestyle and getting preventive care can go a long way to promote health and wellness. Talk with your health care provider about what schedule of regular examinations is right for you. This is a good chance for you to check in with your provider about disease prevention and staying healthy. In between checkups, there are plenty of things you can do on your own. Experts have done a lot of research about which lifestyle changes and preventive measures are most likely to keep you healthy. Ask your health care provider for more information. Weight and diet Eat a healthy diet  Be sure to include plenty of vegetables, fruits, low-fat dairy products, and lean protein.  Do not eat a lot of foods high in solid fats, added sugars, or salt.  Get regular exercise. This is one of the most important things you can do for your health. ? Most adults should exercise for at least 150 minutes each week. The exercise should increase your heart rate and make you sweat (moderate-intensity exercise). ? Most adults should also do strengthening exercises at least twice a week. This is in addition to the moderate-intensity exercise.  Maintain a healthy weight  Body mass index (BMI) is a measurement that can be used to identify possible weight problems. It estimates body fat based on height and weight. Your health care provider can help determine your BMI and help you achieve or maintain a healthy weight.  For females 34 years of age and older: ? A BMI below 18.5 is considered underweight. ? A BMI of 18.5 to 24.9 is normal. ? A BMI of 25 to 29.9 is considered overweight. ? A BMI of 30 and above is considered obese.  Watch levels of cholesterol and blood lipids  You should start having your blood tested for lipids and cholesterol at 60 years of age, then have this test every 5 years.  You may need to  have your cholesterol levels checked more often if: ? Your lipid or cholesterol levels are high. ? You are older than 60 years of age. ? You are at high risk for heart disease.  Cancer screening Lung Cancer  Lung cancer screening is recommended for adults 41-58 years old who are at high risk for lung cancer because of a history of smoking.  A yearly low-dose CT scan of the lungs is recommended for people who: ? Currently smoke. ? Have quit within the past 15 years. ? Have at least a 30-pack-year history of smoking. A pack year is smoking an average of one pack of cigarettes a day for 1 year.  Yearly screening should continue until it has been 15 years since you quit.  Yearly screening should stop if you develop a health problem that would prevent you from having lung cancer treatment.  Breast Cancer  Practice breast self-awareness. This means understanding how your breasts normally appear and feel.  It also means doing regular breast self-exams. Let your health care provider know about any changes, no matter how small.  If you are in your 20s or 30s, you should have a clinical breast exam (CBE) by a health care provider every 1-3 years as part of a regular health exam.  If you are 16 or older, have a CBE every year. Also consider having a breast X-ray (mammogram) every year.  If you have a family history of breast cancer,  talk to your health care provider about genetic screening.  If you are at high risk for breast cancer, talk to your health care provider about having an MRI and a mammogram every year.  Breast cancer gene (BRCA) assessment is recommended for women who have family members with BRCA-related cancers. BRCA-related cancers include: ? Breast. ? Ovarian. ? Tubal. ? Peritoneal cancers.  Results of the assessment will determine the need for genetic counseling and BRCA1 and BRCA2 testing.  Cervical Cancer Your health care provider may recommend that you be screened  regularly for cancer of the pelvic organs (ovaries, uterus, and vagina). This screening involves a pelvic examination, including checking for microscopic changes to the surface of your cervix (Pap test). You may be encouraged to have this screening done every 3 years, beginning at age 85.  For women ages 72-65, health care providers may recommend pelvic exams and Pap testing every 3 years, or they may recommend the Pap and pelvic exam, combined with testing for human papilloma virus (HPV), every 5 years. Some types of HPV increase your risk of cervical cancer. Testing for HPV may also be done on women of any age with unclear Pap test results.  Other health care providers may not recommend any screening for nonpregnant women who are considered low risk for pelvic cancer and who do not have symptoms. Ask your health care provider if a screening pelvic exam is right for you.  If you have had past treatment for cervical cancer or a condition that could lead to cancer, you need Pap tests and screening for cancer for at least 20 years after your treatment. If Pap tests have been discontinued, your risk factors (such as having a new sexual partner) need to be reassessed to determine if screening should resume. Some women have medical problems that increase the chance of getting cervical cancer. In these cases, your health care provider may recommend more frequent screening and Pap tests.  Colorectal Cancer  This type of cancer can be detected and often prevented.  Routine colorectal cancer screening usually begins at 60 years of age and continues through 60 years of age.  Your health care provider may recommend screening at an earlier age if you have risk factors for colon cancer.  Your health care provider may also recommend using home test kits to check for hidden blood in the stool.  A small camera at the end of a tube can be used to examine your colon directly (sigmoidoscopy or colonoscopy). This is  done to check for the earliest forms of colorectal cancer.  Routine screening usually begins at age 70.  Direct examination of the colon should be repeated every 5-10 years through 60 years of age. However, you may need to be screened more often if early forms of precancerous polyps or small growths are found.  Skin Cancer  Check your skin from head to toe regularly.  Tell your health care provider about any new moles or changes in moles, especially if there is a change in a mole's shape or color.  Also tell your health care provider if you have a mole that is larger than the size of a pencil eraser.  Always use sunscreen. Apply sunscreen liberally and repeatedly throughout the day.  Protect yourself by wearing long sleeves, pants, a wide-brimmed hat, and sunglasses whenever you are outside.  Heart disease, diabetes, and high blood pressure  High blood pressure causes heart disease and increases the risk of stroke. High blood pressure is  more likely to develop in: ? People who have blood pressure in the high end of the normal range (130-139/85-89 mm Hg). ? People who are overweight or obese. ? People who are African American.  If you are 24-39 years of age, have your blood pressure checked every 3-5 years. If you are 65 years of age or older, have your blood pressure checked every year. You should have your blood pressure measured twice-once when you are at a hospital or clinic, and once when you are not at a hospital or clinic. Record the average of the two measurements. To check your blood pressure when you are not at a hospital or clinic, you can use: ? An automated blood pressure machine at a pharmacy. ? A home blood pressure monitor.  If you are between 72 years and 17 years old, ask your health care provider if you should take aspirin to prevent strokes.  Have regular diabetes screenings. This involves taking a blood sample to check your fasting blood sugar level. ? If you are  at a normal weight and have a low risk for diabetes, have this test once every three years after 60 years of age. ? If you are overweight and have a high risk for diabetes, consider being tested at a younger age or more often. Preventing infection Hepatitis B  If you have a higher risk for hepatitis B, you should be screened for this virus. You are considered at high risk for hepatitis B if: ? You were born in a country where hepatitis B is common. Ask your health care provider which countries are considered high risk. ? Your parents were born in a high-risk country, and you have not been immunized against hepatitis B (hepatitis B vaccine). ? You have HIV or AIDS. ? You use needles to inject street drugs. ? You live with someone who has hepatitis B. ? You have had sex with someone who has hepatitis B. ? You get hemodialysis treatment. ? You take certain medicines for conditions, including cancer, organ transplantation, and autoimmune conditions.  Hepatitis C  Blood testing is recommended for: ? Everyone born from [birth year] through 1963. ? Anyone with known risk factors for hepatitis C.  Sexually transmitted infections (STIs)  You should be screened for sexually transmitted infections (STIs) including gonorrhea and chlamydia if: ? You are sexually active and are younger than 60 years of age. ? You are older than 60 years of age and your health care provider tells you that you are at risk for this type of infection. ? Your sexual activity has changed since you were last screened and you are at an increased risk for chlamydia or gonorrhea. Ask your health care provider if you are at risk.  If you do not have HIV, but are at risk, it may be recommended that you take a prescription medicine daily to prevent HIV infection. This is called pre-exposure prophylaxis (PrEP). You are considered at risk if: ? You are sexually active and do not regularly use condoms or know the HIV status of your  partner(s). ? You take drugs by injection. ? You are sexually active with a partner who has HIV.  Talk with your health care provider about whether you are at high risk of being infected with HIV. If you choose to begin PrEP, you should first be tested for HIV. You should then be tested every 3 months for as long as you are taking PrEP. Pregnancy  If you are premenopausal and you  may become pregnant, ask your health care provider about preconception counseling.  If you may become pregnant, take 400 to 800 micrograms (mcg) of folic acid every day.  If you want to prevent pregnancy, talk to your health care provider about birth control (contraception). Osteoporosis and menopause  Osteoporosis is a disease in which the bones lose minerals and strength with aging. This can result in serious bone fractures. Your risk for osteoporosis can be identified using a bone density scan.  If you are 76 years of age or older, or if you are at risk for osteoporosis and fractures, ask your health care provider if you should be screened.  Ask your health care provider whether you should take a calcium or vitamin D supplement to lower your risk for osteoporosis.  Menopause may have certain physical symptoms and risks.  Hormone replacement therapy may reduce some of these symptoms and risks. Talk to your health care provider about whether hormone replacement therapy is right for you. Follow these instructions at home:  Schedule regular health, dental, and eye exams.  Stay current with your immunizations.  Do not use any tobacco products including cigarettes, chewing tobacco, or electronic cigarettes.  If you are pregnant, do not drink alcohol.  If you are breastfeeding, limit how much and how often you drink alcohol.  Limit alcohol intake to no more than 1 drink per day for nonpregnant women. One drink equals 12 ounces of beer, 5 ounces of wine, or 1 ounces of hard liquor.  Do not use street  drugs.  Do not share needles.  Ask your health care provider for help if you need support or information about quitting drugs.  Tell your health care provider if you often feel depressed.  Tell your health care provider if you have ever been abused or do not feel safe at home. This information is not intended to replace advice given to you by your health care provider. Make sure you discuss any questions you have with your health care provider. Document Released: 11/23/2010 Document Revised: 10/16/2015 Document Reviewed: 02/11/2015 Elsevier Interactive Patient Education  2018 [employer].     IF you received an x-ray today, you will receive an invoice from [facility]. Please contact [facility] at [phone] with questions or concerns regarding your invoice.   IF you received labwork today, you will receive an invoice from [place]. Please contact [facility] at [phone] with questions or concerns regarding your invoice.   Our billing staff will not be able to assist you with questions regarding bills from these companies.  You will be contacted with the lab results as soon as they are available. The fastest way to get your results is to activate your My Chart account. Instructions are located on the last page of this paperwork. If you have not heard from [place] regarding the results in 2 weeks, please contact this office.

## 2017-09-30 NOTE — Progress Notes (Signed)
MRN: [id number]  Subjective:   [name] is a 62 y.o. female presenting for annual physical exam.  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  She presents wanting to have an exam completed for clearance to foster children.  She is married, has foster children currently.  She admits that this lifestyle is very stressful at times especially with her children. Denies smoking cigarettes or drinking alcohol.   Medical care team includes: PCP: [name], PA-C Vision: Wears reading glasses. Last eye exam was ~2 years ago.  Dental: Dental cleanings once every 6 months, next appt is next month. OB/GYN: Does not have a gynecologist. Does not want pap smear done today. Admits that she has not had it done in years. She wants to schedule an appointment for this.  Specialists: None.  Health Maintenance: Colonoscopy and mammogram scheduled for 10/2017.   [name] has a current medication list which includes the following prescription(s): albuterol, amlodipine, cetirizine, fenofibrate, fluticasone, iron polysaccharides, lansoprazole, losartan-hydrochlorothiazide, metformin, and metoprolol succinate. She is allergic to atorvastatin and sulfa antibiotics.  [name]  has a past medical history of Anemia, Arthritis, DM type 2 (diabetes mellitus, type 2) ([place]), Heart murmur, HTN (hypertension), Hypercholesterolemia, and Reflux. Also  has a past surgical history that includes Cesarean section and Tubal ligation. Her family history includes Heart disease in her father; Hypertension in her mother; Multiple sclerosis in her brother and sister; Stroke in her brother and mother.  Immunizations: Up to date.   Review of Systems  Constitutional: Negative for chills, diaphoresis, fever, malaise/fatigue and weight loss.  HENT: Negative for congestion, ear discharge, ear pain, hearing loss, nosebleeds, sore throat and tinnitus.   Eyes: Negative for blurred vision, double  vision, photophobia, pain, discharge and redness.  Respiratory: Negative for cough, shortness of breath and wheezing.   Cardiovascular: Negative for chest pain, palpitations and leg swelling.  Gastrointestinal: Negative for abdominal pain, blood in stool, constipation, diarrhea, nausea and vomiting.  Genitourinary: Negative for dysuria, flank pain, frequency, hematuria and urgency.  Musculoskeletal: Positive for myalgias. Negative for back pain and joint pain.  Skin: Negative for itching and rash.  Neurological: Negative for dizziness, tingling, seizures, loss of consciousness, weakness and headaches.  Endo/Heme/Allergies: Negative for polydipsia.  Psychiatric/Behavioral: Negative for depression, hallucinations, memory loss, substance abuse and suicidal ideas. The patient is not nervous/anxious and does not have insomnia.    Objective:   Vitals: BP (!) 142/82   Pulse 87   Temp 98 F (36.7 C) (Oral)   Resp 16   Ht 4' 10.27" (1.48 m)   Wt 205 lb (93 kg)   SpO2 98%   BMI 42.45 kg/m   BP Readings from Last 3 Encounters:  09/30/17 (!) 142/82  08/19/17 (!) 190/90  06/14/17 (!) 178/102   Physical Exam  Constitutional: She is oriented to person, place, and time. She appears well-developed and well-nourished.  HENT:  TM's intact bilaterally, no effusions or erythema. Nasal turbinates pink and moist, nasal passages patent. No sinus tenderness. Oropharynx clear, mucous membranes moist, dentition in good repair.  Eyes: Pupils are equal, round, and reactive to light. Conjunctivae and EOM are normal. Right eye exhibits no discharge. Left eye exhibits no discharge. No scleral icterus.  Neck: Normal range of motion. Neck supple. No thyromegaly present.  Cardiovascular: Normal rate, regular rhythm and intact distal pulses. Exam reveals no gallop and no friction rub.  No murmur heard. Pulmonary/Chest: No respiratory distress. She has no wheezes. She has  no rales.  Abdominal: Soft. Bowel sounds  are normal. She exhibits no distension and no mass. There is no tenderness.  Musculoskeletal: Normal range of motion. She exhibits no edema or tenderness.  Lymphadenopathy:    She has no cervical adenopathy.  Neurological: She is alert and oriented to person, place, and time. She has normal reflexes. She displays normal reflexes. Coordination normal.  Skin: Skin is warm and dry. No rash noted. No erythema. No pallor.  Psychiatric: She has a normal mood and affect.   Assessment and Plan :   Annual physical exam  Essential hypertension - Plan: TSH, Ambulatory referral to Ophthalmology  Mixed hyperlipidemia - Plan: Lipid panel  Controlled type 2 diabetes mellitus without complication, without long-term current use of insulin ([place]) - Plan: Ambulatory referral to Ophthalmology  Screening for deficiency anemia - Plan: CBC  Screening for tuberculosis - Plan: QuantiFERON-TB Gold Plus  Discussed healthy lifestyle, diet, exercise, preventative care, vaccinations, and addressed patient's concerns.  Patient's blood pressure is well controlled today.  We will maintain her current regimen.  Labs pending.  Okay to refill all her medications until her next follow-up visit in 6 months.  QuantiFERON labs pending.  [name], PA-C [facility] [place] [phone] 09/30/2017  11:36 AM

## 2017-10-01 LAB — LIPID PANEL
Chol/HDL Ratio: 4.9 ratio — ABNORMAL HIGH (ref 0.0–4.4)
Cholesterol, Total: 247 mg/dL — ABNORMAL HIGH (ref 100–199)
HDL: 50 mg/dL (ref >39)
LDL Calculated: 161 mg/dL — ABNORMAL HIGH (ref 0–99)
Triglycerides: 179 mg/dL — ABNORMAL HIGH (ref 0–149)
VLDL Cholesterol Cal: 36 mg/dL (ref 5–40)

## 2017-10-01 LAB — CBC
Hematocrit: 37.7 % (ref 34.0–46.6)
Hemoglobin: 11.7 g/dL (ref 11.1–15.9)
MCH: 23.9 pg — ABNORMAL LOW (ref 26.6–33.0)
MCHC: 31 g/dL — ABNORMAL LOW (ref 31.5–35.7)
MCV: 77 fL — ABNORMAL LOW (ref 79–97)
Platelets: 431 10*3/uL — ABNORMAL HIGH (ref 150–379)
RBC: 4.9 x10E6/uL (ref 3.77–5.28)
RDW: 18 % — ABNORMAL HIGH (ref 12.3–15.4)
WBC: 7 10*3/uL (ref 3.4–10.8)

## 2017-10-01 LAB — TSH: TSH: 1.43 u[IU]/mL (ref 0.450–4.500)

## 2017-10-06 ENCOUNTER — Telehealth: Payer: Self-pay

## 2017-10-06 LAB — QUANTIFERON-TB GOLD PLUS
QuantiFERON Mitogen Value: >10 IU/mL
QuantiFERON Nil Value: 0.01 IU/mL
QuantiFERON TB1 Ag Value: 0.02 IU/mL
QuantiFERON TB2 Ag Value: 0.01 IU/mL
QuantiFERON-TB Gold Plus: NEGATIVE

## 2017-10-06 NOTE — Telephone Encounter (Signed)
Copied from [place] [phone]. Topic: Inquiry >> Oct 06, 2017  8:36 AM Ether Griffins B wrote: Reason for CRM: pt is calling to see if her physical form is ready to pick up. She had her cpe on 09/30/17 with [facility]   >> Oct 06, 2017  9:14 AM Audelia Hives R wrote: The lab results are delayed and that is the hold up.  The provider is checking with the lab and as soon as those results are back he can complete the form.  They will call her to notify her when its done.  TB blood test is still pending

## 2017-10-06 NOTE — Addendum Note (Signed)
Addended by: Ileana Roup on: 10/06/2017 09:04 AM   Modules accepted: Orders

## 2017-10-09 LAB — PATHOLOGIST SMEAR REVIEW
Basophils Absolute: 0 10*3/uL (ref 0.0–0.2)
Basos: 0 %
EOS (ABSOLUTE): 0.1 10*3/uL (ref 0.0–0.4)
Eos: 2 %
Hematocrit: 39.8 % (ref 34.0–46.6)
Hemoglobin: 12.1 g/dL (ref 11.1–15.9)
Immature Grans (Abs): 0 10*3/uL (ref 0.0–0.1)
Immature Granulocytes: 1 %
Lymphocytes Absolute: 2.1 10*3/uL (ref 0.7–3.1)
Lymphs: 37 %
MCH: 23.8 pg — ABNORMAL LOW (ref 26.6–33.0)
MCHC: 30.4 g/dL — ABNORMAL LOW (ref 31.5–35.7)
MCV: 78 fL — ABNORMAL LOW (ref 79–97)
Monocytes Absolute: 0.4 10*3/uL (ref 0.1–0.9)
Monocytes: 8 %
Neutrophils Absolute: 3 10*3/uL (ref 1.4–7.0)
Neutrophils: 52 %
Path Rev WBC: NORMAL
Platelets: 405 10*3/uL — ABNORMAL HIGH (ref 150–379)
RBC: 5.08 x10E6/uL (ref 3.77–5.28)
RDW: 18.3 % — ABNORMAL HIGH (ref 12.3–15.4)
WBC: 5.7 10*3/uL (ref 3.4–10.8)

## 2017-10-21 MED FILL — CELECOXIB 200 MG CAP: 200 | 30 days supply | Qty: 60 | Fill #1

## 2017-10-21 MED FILL — GABAPENTIN 300 MG CAPSULE: 300 | 30 days supply | Qty: 90 | Fill #0

## 2017-10-26 MED FILL — FENOFIBRATE 54 MG TABLET: 54 | 90 days supply | Qty: 90 | Fill #1

## 2017-10-27 MED FILL — CLOBETASOL PROPIONATE 0.05: 0.05 | 30 days supply | Qty: 60 | Fill #0

## 2017-10-31 ENCOUNTER — Ambulatory Visit (AMBULATORY_SURGERY_CENTER): Payer: Self-pay | Admitting: *Deleted

## 2017-10-31 ENCOUNTER — Other Ambulatory Visit: Payer: Self-pay

## 2017-10-31 VITALS — Ht <= 58 in | Wt 208.0 lb

## 2017-10-31 DIAGNOSIS — Z1211 Encounter for screening for malignant neoplasm of colon: Secondary | ICD-10-CM

## 2017-10-31 MED ORDER — NA SULFATE-K SULFATE-MG SULF 17.5-3.13-1.6 GM/177ML PO SOLN: 1.0000 [IU] | Freq: Once | ORAL | 0 refills | Status: AC

## 2017-10-31 MED FILL — SUPREP BOWEL PREP KIT: 17.5-3.13-1 | 1 days supply | Qty: 354 | Fill #0

## 2017-10-31 NOTE — Progress Notes (Addendum)
No egg or soy allergy known to patient  No issues with past sedation with any surgeries  or procedures, no intubation problems  No diet pills per patient No home 02 use per patient  No blood thinners per patient  Pt denies issues with constipation  No A fib or A flutter  EMMI video sent to pt's e mail   Pt. Had BMI of 43.47 Wt. 208 Ht. 4'10 " I had Osvaldo Angst come in to evaluate her airway and he cleared patient for anesthesia here at [facility].  B.[place], CMA

## 2017-11-01 ENCOUNTER — Encounter: Payer: Self-pay | Admitting: Gastroenterology

## 2017-11-02 ENCOUNTER — Ambulatory Visit: Payer: Self-pay

## 2017-11-02 MED FILL — POLY-IRON 150 MG CAPSULE: 150 | 30 days supply | Qty: 30 | Fill #10

## 2017-11-14 ENCOUNTER — Other Ambulatory Visit: Payer: Self-pay

## 2017-11-14 ENCOUNTER — Ambulatory Visit (AMBULATORY_SURGERY_CENTER): Payer: Medicare HMO | Admitting: Gastroenterology

## 2017-11-14 ENCOUNTER — Encounter: Payer: Self-pay | Admitting: Gastroenterology

## 2017-11-14 VITALS — BP 135/59 | HR 71 | Temp 97.7°F | Resp 14 | Ht <= 58 in | Wt 205.0 lb

## 2017-11-14 DIAGNOSIS — D124 Benign neoplasm of descending colon: Secondary | ICD-10-CM | POA: Diagnosis not present

## 2017-11-14 DIAGNOSIS — Z1211 Encounter for screening for malignant neoplasm of colon: Secondary | ICD-10-CM

## 2017-11-14 MED ORDER — SODIUM CHLORIDE 0.9 % IV SOLN: 500.0000 mL | INTRAVENOUS | Status: DC

## 2017-11-14 NOTE — Progress Notes (Signed)
Called to room to assist during endoscopic procedure.  Patient ID and intended procedure confirmed with present staff. Received instructions for my participation in the procedure from the performing physician.  

## 2017-11-14 NOTE — Progress Notes (Signed)
Pt's states no medical or surgical changes since previsit or office visit. 

## 2017-11-14 NOTE — Op Note (Signed)
[employer] Patient Name: [place] Procedure Date: 11/14/2017 8:59 AM MRN: [id number] Endoscopist: Mauri Pole , MD Age: 62 Referring MD:  Date of Birth: 08-03-1955 Gender: Female Account #: [id number] Procedure:                Colonoscopy Indications:              Screening for colorectal malignant neoplasm Medicines:                Monitored Anesthesia Care Procedure:                Pre-Anesthesia Assessment:                           - Prior to the procedure, a History and Physical                            was performed, and patient medications and                            allergies were reviewed. The patient's tolerance of                            previous anesthesia was also reviewed. The risks                            and benefits of the procedure and the sedation                            options and risks were discussed with the patient.                            All questions were answered, and informed consent                            was obtained. Prior Anticoagulants: The patient has                            taken no previous anticoagulant or antiplatelet                            agents. ASA Grade Assessment: II - A patient with                            mild systemic disease. After reviewing the risks                            and benefits, the patient was deemed in                            satisfactory condition to undergo the procedure.                           After obtaining informed consent, the colonoscope  was passed under direct vision. Throughout the                            procedure, the patient's blood pressure, pulse, and                            oxygen saturations were monitored continuously. The                            Colonoscope was introduced through the anus and                            advanced to the the cecum, identified by                            appendiceal orifice  and ileocecal valve. The                            colonoscopy was performed without difficulty. The                            patient tolerated the procedure well. The quality                            of the bowel preparation was excellent. The                            ileocecal valve, appendiceal orifice, and rectum                            were photographed. Scope In: 9:04:28 AM Scope Out: 9:17:25 AM Scope Withdrawal Time: 0 hours 10 minutes 51 seconds  Total Procedure Duration: 0 hours 12 minutes 57 seconds  Findings:                 The perianal and digital rectal examinations were                            normal.                           Two sessile polyps were found in the descending                            colon. The polyps were 1 to 2 mm in size. These                            polyps were removed with a cold biopsy forceps.                            Resection and retrieval were complete.                           Non-bleeding internal hemorrhoids were found during  retroflexion. The hemorrhoids were small. Complications:            No immediate complications. Estimated Blood Loss:     Estimated blood loss was minimal. Impression:               - Two 1 to 2 mm polyps in the descending colon,                            removed with a cold biopsy forceps. Resected and                            retrieved.                           - Non-bleeding internal hemorrhoids. Recommendation:           - Patient has a contact number available for                            emergencies. The signs and symptoms of potential                            delayed complications were discussed with the                            patient. Return to normal activities tomorrow.                            Written discharge instructions were provided to the                            patient.                           - Resume previous diet.                            - Continue present medications.                           - Await pathology results.                           - Repeat colonoscopy in 5-10 years for surveillance                            based on pathology results. Mauri Pole, MD 11/14/2017 9:22:39 AM This report has been signed electronically.

## 2017-11-14 NOTE — Patient Instructions (Signed)
Impression/Recommendations:  Polyp handout given to patient. Hemorrhoid handout given to patient.  Resume previous diet. Continue present medications.  Repeat colonoscopy in 5-10 years for surveillance based on pathology results.  YOU HAD AN ENDOSCOPIC PROCEDURE TODAY AT [facility]:   Refer to the procedure report that was given to you for any specific questions about what was found during the examination.  If the procedure report does not answer your questions, please call your gastroenterologist to clarify.  If you requested that your care partner not be given the details of your procedure findings, then the procedure report has been included in a sealed envelope for you to review at your convenience later.  YOU SHOULD EXPECT: Some feelings of bloating in the abdomen. Passage of more gas than usual.  Walking can help get rid of the air that was put into your GI tract during the procedure and reduce the bloating. If you had a lower endoscopy (such as a colonoscopy or flexible sigmoidoscopy) you may notice spotting of blood in your stool or on the toilet paper. If you underwent a bowel prep for your procedure, you may not have a normal bowel movement for a few days.  Please Note:  You might notice some irritation and congestion in your nose or some drainage.  This is from the oxygen used during your procedure.  There is no need for concern and it should clear up in a day or so.  SYMPTOMS TO REPORT IMMEDIATELY:   Following lower endoscopy (colonoscopy or flexible sigmoidoscopy):  Excessive amounts of blood in the stool  Significant tenderness or worsening of abdominal pains  Swelling of the abdomen that is new, acute  Fever of 100F or higher  For urgent or emergent issues, a gastroenterologist can be reached at any hour by calling [phone].   DIET:  We do recommend a small meal at first, but then you may proceed to your regular diet.  Drink plenty of fluids but you  should avoid alcoholic beverages for 24 hours.  ACTIVITY:  You should plan to take it easy for the rest of today and you should NOT DRIVE or use heavy machinery until tomorrow (because of the sedation medicines used during the test).    FOLLOW UP: Our staff will call the number listed on your records the next business day following your procedure to check on you and address any questions or concerns that you may have regarding the information given to you following your procedure. If we do not reach you, we will leave a message.  However, if you are feeling well and you are not experiencing any problems, there is no need to return our call.  We will assume that you have returned to your regular daily activities without incident.  If any biopsies were taken you will be contacted by phone or by letter within the next 1-3 weeks.  Please call us at [phone] if you have not heard about the biopsies in 3 weeks.    SIGNATURES/CONFIDENTIALITY: You and/or your care partner have signed paperwork which will be entered into your electronic medical record.  These signatures attest to the fact that that the information above on your After Visit Summary has been reviewed and is understood.  Full responsibility of the confidentiality of this discharge information lies with you and/or your care-partner.

## 2017-11-14 NOTE — Progress Notes (Signed)
Report to PACU, RN, vss, BBS= Clear.  

## 2017-11-15 ENCOUNTER — Telehealth: Payer: Self-pay | Admitting: *Deleted

## 2017-11-15 NOTE — Telephone Encounter (Signed)
  Follow up Call-  Call back number 11/14/2017  Post procedure Call Back phone  # [phone]  Permission to leave phone message Yes  Some recent data might be hidden     Patient questions:  Message left to call us if necessary.

## 2017-11-15 NOTE — Telephone Encounter (Signed)
  Follow up Call-  Call back number 11/14/2017  Post procedure Call Back phone  # [phone]  Permission to leave phone message Yes  Some recent data might be hidden     Patient questions:  Do you have a fever, pain , or abdominal swelling? No. Pain Score  0 *  Have you tolerated food without any problems? Yes.    Have you been able to return to your normal activities? Yes.    Do you have any questions about your discharge instructions: Diet   No. Medications  No. Follow up visit  No.  Do you have questions or concerns about your Care? No.  Actions: No pain  .

## 2017-11-23 ENCOUNTER — Other Ambulatory Visit: Payer: Self-pay | Admitting: Urgent Care

## 2017-11-23 ENCOUNTER — Other Ambulatory Visit: Payer: Self-pay | Admitting: *Deleted

## 2017-11-23 DIAGNOSIS — E119 Type 2 diabetes mellitus without complications: Secondary | ICD-10-CM

## 2017-11-23 DIAGNOSIS — I10 Essential (primary) hypertension: Secondary | ICD-10-CM

## 2017-11-23 DIAGNOSIS — K219 Gastro-esophageal reflux disease without esophagitis: Secondary | ICD-10-CM

## 2017-11-23 DIAGNOSIS — D509 Iron deficiency anemia, unspecified: Secondary | ICD-10-CM

## 2017-11-23 MED ORDER — AMLODIPINE BESYLATE 10 MG PO TABS: 10.0000 mg | ORAL_TABLET | Freq: Every day | ORAL | 0 refills | Status: DC

## 2017-11-23 MED ORDER — METOPROLOL SUCCINATE ER 100 MG PO TB24: 100.0000 mg | ORAL_TABLET | Freq: Every day | ORAL | 0 refills | Status: DC

## 2017-11-23 MED ORDER — POLYSACCHARIDE IRON COMPLEX 150 MG PO CAPS: 150.0000 mg | ORAL_CAPSULE | Freq: Every day | ORAL | 1 refills | Status: DC

## 2017-11-23 MED ORDER — LANSOPRAZOLE 30 MG PO CPDR: 30.0000 mg | DELAYED_RELEASE_CAPSULE | Freq: Every day | ORAL | 0 refills | Status: DC

## 2017-11-23 MED ORDER — METFORMIN HCL 1000 MG PO TABS: 1000.0000 mg | ORAL_TABLET | Freq: Two times a day (BID) | ORAL | 0 refills | Status: DC

## 2017-11-23 MED ORDER — LOSARTAN POTASSIUM-HCTZ 100-25 MG PO TABS: 1.0000 | ORAL_TABLET | Freq: Every day | ORAL | 0 refills | Status: DC

## 2017-11-23 MED ORDER — METFORMIN HCL 1000 MG PO TABS: 1000.0000 mg | ORAL_TABLET | Freq: Two times a day (BID) | ORAL | 1 refills | Status: DC

## 2017-11-23 MED ORDER — AMLODIPINE BESYLATE 10 MG PO TABS: 10.0000 mg | ORAL_TABLET | Freq: Every day | ORAL | 1 refills | Status: AC

## 2017-11-23 MED ORDER — METOPROLOL SUCCINATE ER 100 MG PO TB24: 100.0000 mg | ORAL_TABLET | Freq: Every day | ORAL | 1 refills | Status: DC

## 2017-11-23 MED ORDER — FENOFIBRATE 54 MG PO TABS: 54.0000 mg | ORAL_TABLET | Freq: Every day | ORAL | 0 refills | Status: DC

## 2017-11-23 MED ORDER — POLYSACCHARIDE IRON COMPLEX 150 MG PO CAPS: 150.0000 mg | ORAL_CAPSULE | Freq: Every day | ORAL | 0 refills | Status: DC

## 2017-11-23 MED ORDER — LOSARTAN POTASSIUM-HCTZ 100-25 MG PO TABS: 1.0000 | ORAL_TABLET | Freq: Every day | ORAL | 1 refills | Status: DC

## 2017-11-23 MED ORDER — FENOFIBRATE 54 MG PO TABS: 54.0000 mg | ORAL_TABLET | Freq: Every day | ORAL | 1 refills | Status: DC

## 2017-11-23 MED ORDER — LANSOPRAZOLE 30 MG PO CPDR: 30.0000 mg | DELAYED_RELEASE_CAPSULE | Freq: Every day | ORAL | 1 refills | Status: DC

## 2017-11-23 NOTE — Telephone Encounter (Signed)
All prescriptions forwarded with remaining refills sent.   Gabapentin Rx does not have # - for provider review.

## 2017-11-23 NOTE — Telephone Encounter (Unsigned)
Copied from [place] [id number]. Topic: Quick Communication - See Telephone Encounter >> Nov 23, 2017 11:14 AM Antonieta Iba C wrote: CRM for notification. See Telephone encounter for: 11/23/17.   Pt called in to follow up. Pt says that she now have medicare and was advised by them to call in to make office aware that they have been trying to reach them in regards to setting pt's Rx up on mail order. Pt provided a phone number for the office to call for assistance with this.   [facility]  >> Nov 23, 2017 11:48 AM Antonieta Iba C wrote: [facility] called in to provide there information for mail order delivery. They stated that they have faxed information in June. Pt is completely out of medications due to delay with pharmacy update.   Medications needed:   iron polysaccharides (NIFEREX) 150 MG capsule  Lansoprazole  Metoprolol  Losartan Amlodipine Fenofibrate Metformin  Gabapentin    Pharmacy: [facility]-- updated pharmacy in chart.   Fax: [phone]

## 2017-11-28 ENCOUNTER — Encounter: Payer: Self-pay | Admitting: Gastroenterology

## 2017-11-30 ENCOUNTER — Ambulatory Visit
Admission: RE | Admit: 2017-11-30 | Discharge: 2017-11-30 | Disposition: A | Discharge status: Home / Self Care | Payer: Medicare HMO | Source: Ambulatory Visit | Attending: Urgent Care | Admitting: Urgent Care

## 2017-11-30 DIAGNOSIS — Z1239 Encounter for other screening for malignant neoplasm of breast: Secondary | ICD-10-CM

## 2017-12-23 ENCOUNTER — Other Ambulatory Visit: Payer: Self-pay | Admitting: Neurosurgery

## 2017-12-26 NOTE — Progress Notes (Signed)
Cardiology Office Note   Date:  12/27/2017   ID:  Marilu, Rylander 1955-10-23, MRN [id number]  PCP:  [name], PA-C  Cardiologist:   Jenkins Rouge, MD   No chief complaint on file.     History of Present Illness: [name] is a 62 y.o. female who presents for consultation regarding cardiac clearance for spine surgery Referred by Ashok Pall.  Needs a two level lumbar arthrodesis and decompression Worsening back pain 10 years failed conservative Rx CRF;s  HLD, HTN and DM. There is a question of a heart murmur Indicates allergy to atorvastatin Father has heart disease and mother HTN  She is sedentary has fostered children in past.   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 Allergy   . Anemia   . Anxiety   . Arthritis   . Asthma   . BMI 40.0-44.9, adult ([place]) 04/07/2014  . DM type 2 (diabetes mellitus, type 2) ([place])   . GERD (gastroesophageal reflux disease)   . Heart murmur   . HTN (hypertension)   . Hypercholesterolemia   . Osteoarthritis of left hip 04/07/2014  . Reflux     Past Surgical History:  Procedure Laterality Date  . CESAREAN SECTION     x3  . COLONOSCOPY    . TUBAL LIGATION       Current Outpatient Medications  Medication Sig Dispense Refill  . albuterol (PROAIR HFA) 108 (90 BASE) MCG/ACT inhaler Inhale 2 puffs into the lungs every 6 (six) hours as needed. 1 Inhaler 12  . amLODipine (NORVASC) 10 MG tablet Take 1 tablet (10 mg total) by mouth daily. 90 tablet 1  . celecoxib (CELEBREX) 200 MG capsule Take 1 capsule by mouth daily.  1  . cetirizine (ZYRTEC) 10 MG tablet Take 1 tablet (10 mg total) by mouth daily. 30 tablet 11  . clobetasol ointment (TEMOVATE) 4.50 % Apply 1 application topically 4 (four) times a week.    . fenofibrate 54 MG tablet  Take 1 tablet (54 mg total) by mouth daily. 90 tablet 1  . fluticasone (FLONASE) 50 MCG/ACT nasal spray Place 2 sprays into both nostrils daily. 16 g 11  . gabapentin (NEURONTIN) 300 MG capsule Take 300 mg by mouth daily.  5  . iron polysaccharides (NIFEREX) 150 MG capsule Take 1 capsule (150 mg total) by mouth daily. 90 capsule 1  . lansoprazole (PREVACID) 30 MG capsule Take 1 capsule (30 mg total) by mouth daily. 90 capsule 1  . losartan-hydrochlorothiazide (HYZAAR) 100-25 MG tablet Take 1 tablet by mouth daily. 90 tablet 1  . metFORMIN (GLUCOPHAGE) 1000 MG tablet Take 1 tablet (1,000 mg total) by mouth 2 (two) times daily with a meal. 180 tablet 1  . metoprolol succinate (TOPROL-XL) 100 MG 24 hr tablet Take 1 tablet (100 mg total) by mouth daily. Take with or immediately following a meal. 90 tablet 1   Current Facility-Administered Medications  Medication Dose Route Frequency Provider Last Rate Last Dose  . 0.9 %  sodium chloride infusion  500 mL Intravenous Continuous Nandigam, Venia Minks, MD        Allergies:   Atorvastatin and Sulfa antibiotics    Social History:  The patient  reports that she has never smoked. She has never  used smokeless tobacco. She reports that she does not drink alcohol or use drugs.   Family History:  The patient's family history includes Heart disease in her father; Hypertension in her mother; Multiple sclerosis in her brother and sister; Stroke in her brother and mother.    ROS:  Please see the history of present illness.   Otherwise, review of systems are positive for none.   All other systems are reviewed and negative.    PHYSICAL EXAM: VS:  BP 134/78   Pulse 78   Ht 4\' 10"  (1.473 m)   Wt 209 lb 12 oz (95.1 kg)   SpO2 98%   BMI 43.84 kg/m  , BMI Body mass index is 43.84 kg/m. Affect appropriate Heavy chested black female  HEENT: normal Neck supple with no adenopathy JVP normal no bruits no thyromegaly Lungs clear with no wheezing and good  diaphragmatic motion Heart:  S9/F0 4/6 harsh systolic  murmur, no rub, gallop or click PMI normal Abdomen: benighn, BS positve, no tenderness, no AAA no bruit.  No HSM or HJR Distal pulses intact with no bruits No edema Neuro non-focal Skin warm and dry No muscular weakness    EKG:  2017 SR nonspecific ST changes 12/27/17  SR rat e78 nonspecific ST changes stable since 2017    Recent Labs: 08/19/2017: ALT 22; BUN 17; Creatinine, Ser 1.01; Potassium 3.9; Sodium 144 09/30/2017: TSH 1.430 10/06/2017: Hemoglobin 12.1; Platelets 405    Lipid Panel    Component Value Date/Time   CHOL 247 (H) 09/30/2017 1554   TRIG 179 (H) 09/30/2017 1554   HDL 50 09/30/2017 1554   CHOLHDL 4.9 (H) 09/30/2017 1554   CHOLHDL 3.2 11/12/2015 1436   VLDL 28 11/12/2015 1436   LDLCALC 161 (H) 09/30/2017 1554      Wt Readings from Last 3 Encounters:  12/27/17 209 lb 12 oz (95.1 kg)  11/14/17 205 lb (93 kg)  10/31/17 208 lb (94.3 kg)      Other studies Reviewed: Additional studies/ records that were reviewed today include: Notes form [facility] primary notes neurosurgery Dr Cyndy Freeze TTE done 2017 .    ASSESSMENT AND PLAN:  1.  Preoperative Risk Assessment:  She has a loud aortic murmur needs echo to further assess She is ok to proceed with surgery but murmur should be followed for development of AS and clarify if she has bicuspid valve since she has had murmur since age 89 according to her recollection  2. HTN:  Well controlled.  Continue current medications and low sodium Dash type diet.   3. HLD intolerant to lipitor with myalgias Continue fenofibrate f/u primary and consider trial of alternative statin such as crestor  4. DM:  Discussed low carb diet.  Target hemoglobin A1c is 6.5 or less.  Continue current medications.    Current medicines are reviewed at length with the patient today.  The patient does not have concerns regarding medicines.  The following changes have been made:  no change  Labs/  tests ordered today include: Notes DR Cyndy Freeze labs ECG   Orders Placed This Encounter  Procedures  . EKG 12-Lead  . ECHOCARDIOGRAM COMPLETE     Disposition:   FU with cardiology PRN      Signed, Jenkins Rouge, MD  12/27/2017 11:34 AM    [facility], [place], [place]  [address] Phone: [phone]; Fax: [phone]

## 2017-12-27 ENCOUNTER — Ambulatory Visit: Payer: Medicare HMO | Admitting: Cardiovascular Disease

## 2017-12-27 ENCOUNTER — Encounter: Payer: Self-pay | Admitting: Cardiovascular Disease

## 2017-12-27 VITALS — BP 134/78 | HR 78 | Ht <= 58 in | Wt 209.8 lb

## 2017-12-27 DIAGNOSIS — R011 Cardiac murmur, unspecified: Secondary | ICD-10-CM

## 2017-12-27 DIAGNOSIS — Z7689 Persons encountering health services in other specified circumstances: Secondary | ICD-10-CM

## 2017-12-27 NOTE — Patient Instructions (Signed)

## 2018-01-02 MED FILL — NITROFURANTOIN MONO-MCR 100: 100 | 5 days supply | Qty: 10 | Fill #0

## 2018-01-11 ENCOUNTER — Ambulatory Visit (HOSPITAL_COMMUNITY): Payer: Medicare HMO | Attending: Cardiovascular Disease

## 2018-01-11 ENCOUNTER — Other Ambulatory Visit: Payer: Self-pay

## 2018-01-11 DIAGNOSIS — I350 Nonrheumatic aortic (valve) stenosis: Secondary | ICD-10-CM | POA: Insufficient documentation

## 2018-01-11 DIAGNOSIS — R011 Cardiac murmur, unspecified: Secondary | ICD-10-CM | POA: Diagnosis not present

## 2018-01-11 DIAGNOSIS — Z7689 Persons encountering health services in other specified circumstances: Secondary | ICD-10-CM | POA: Insufficient documentation

## 2018-01-16 DIAGNOSIS — K219 Gastro-esophageal reflux disease without esophagitis: Secondary | ICD-10-CM | POA: Insufficient documentation

## 2018-01-17 ENCOUNTER — Telehealth: Payer: Self-pay

## 2018-01-17 DIAGNOSIS — I350 Nonrheumatic aortic (valve) stenosis: Secondary | ICD-10-CM

## 2018-01-17 NOTE — Telephone Encounter (Signed)
-----   Message from Josue Hector, MD sent at 01/11/2018  4:27 PM EDT ----- Moderate AS fu echo in a year

## 2018-01-17 NOTE — Telephone Encounter (Signed)
Patient aware of echo results. Per Dr. Johnsie Cancel, moderate AS f/u echo in a year. Patient verbalized understanding.

## 2018-01-24 NOTE — Pre-Procedure Instructions (Signed)
[place]  01/24/2018      [place], [place] - [address] [facility]. [address] [place] [address] Phone: [phone] Fax: [facility] [place], [place] [address] Phone: [phone] Fax: [phone]    Your procedure is scheduled on February 01, 2018.  Report to [facility] Admitting at 700 AM.  Call this number if you have problems the morning of surgery:  [phone]   Remember:  Do not eat or drink after midnight.     Take these medicines the morning of surgery with A SIP OF WATER  Albuterol inhaler-if needed (bring inhaler with you) Amlodipine (norvasc) Cetirizine (zyrtec) Gabapentin (neurontin) Lansoprazole (prevacid) Metoprolol succinate (Toprol-XL)  7 days prior to surgery STOP taking any celebrex (celecoxib), Aspirin (unless otherwise instructed by your surgeon), Aleve, Naproxen, Ibuprofen, Motrin, Advil, Goody's, BC's, all herbal medications, fish oil, and all vitamins   WHAT DO I DO ABOUT MY DIABETES MEDICATION?  Marland Kitchen Do not take oral diabetes medicines (pills) the morning of surgery-metformin (glucophage).  Reviewed and Endorsed by [facility] Patient Education Committee, August 2015  How to Manage Your Diabetes Before and After Surgery  Why is it important to control my blood sugar before and after surgery? . Improving blood sugar levels before and after surgery helps healing and can limit problems. . A way of improving blood sugar control is eating a healthy diet by: o  Eating less sugar and carbohydrates o  Increasing activity/exercise o  Talking with your doctor about reaching your blood sugar goals . High blood sugars (greater than 180 mg/dL) can raise your risk of infections and slow your recovery, so you will need to focus on controlling your diabetes during the weeks before surgery. . Make sure that  the doctor who takes care of your diabetes knows about your planned surgery including the date and location.  How do I manage my blood sugar before surgery? . Check your blood sugar at least 4 times a day, starting 2 days before surgery, to make sure that the level is not too high or low. o Check your blood sugar the morning of your surgery when you wake up and every 2 hours until you get to the Short Stay unit. . If your blood sugar is less than 70 mg/dL, you will need to treat for low blood sugar: o Do not take insulin. o Treat a low blood sugar (less than 70 mg/dL) with  cup of clear juice (cranberry or apple), 4 glucose tablets, OR glucose gel. Recheck blood sugar in 15 minutes after treatment (to make sure it is greater than 70 mg/dL). If your blood sugar is not greater than 70 mg/dL on recheck, call [phone] o  for further instructions. . Report your blood sugar to the short stay nurse when you get to Short Stay.  . If you are admitted to the hospital after surgery: o Your blood sugar will be checked by the staff and you will probably be given insulin after surgery (instead of oral diabetes medicines) to make sure you have good blood sugar levels. o The goal for blood sugar control after surgery is 80-180 mg/dL.   Do not wear jewelry, make-up or nail polish.  Do not wear lotions, powders, or perfumes, or deodorant.  Do not shave 48 hours prior to surgery.    Do not bring valuables to  the hospital.  [facility] is not responsible for any belongings or valuables.  Contacts, dentures or bridgework may not be worn into surgery.  Leave your suitcase in the car.  After surgery it may be brought to your room.  For patients admitted to the hospital, discharge time will be determined by your treatment team.  Patients discharged the day of surgery will not be allowed to drive home.    [facility]- Preparing For Surgery  Before surgery, you can play an important role. Because skin is  not sterile, your skin needs to be as free of germs as possible. You can reduce the number of germs on your skin by washing with CHG (chlorahexidine gluconate) Soap before surgery.  CHG is an antiseptic cleaner which kills germs and bonds with the skin to continue killing germs even after washing.    Oral Hygiene is also important to reduce your risk of infection.  Remember - BRUSH YOUR TEETH THE MORNING OF SURGERY WITH YOUR REGULAR TOOTHPASTE  Please do not use if you have an allergy to CHG or antibacterial soaps. If your skin becomes reddened/irritated stop using the CHG.  Do not shave (including legs and underarms) for at least 48 hours prior to first CHG shower. It is OK to shave your face.  Please follow these instructions carefully.   1. Shower the NIGHT BEFORE SURGERY and the MORNING OF SURGERY with CHG.   2. If you chose to wash your hair, wash your hair first as usual with your normal shampoo.  3. After you shampoo, rinse your hair and body thoroughly to remove the shampoo.  4. Use CHG as you would any other liquid soap. You can apply CHG directly to the skin and wash gently with a scrungie or a clean washcloth.   5. Apply the CHG Soap to your body ONLY FROM THE NECK DOWN.  Do not use on open wounds or open sores. Avoid contact with your eyes, ears, mouth and genitals (private parts). Wash Face and genitals (private parts)  with your normal soap.  6. Wash thoroughly, paying special attention to the area where your surgery will be performed.  7. Thoroughly rinse your body with warm water from the neck down.  8. DO NOT shower/wash with your normal soap after using and rinsing off the CHG Soap.  9. Pat yourself dry with a CLEAN TOWEL.  10. Wear CLEAN PAJAMAS to bed the night before surgery, wear comfortable clothes the morning of surgery  11. Place CLEAN SHEETS on your bed the night of your first shower and DO NOT SLEEP WITH PETS.  Day of Surgery:  Do not apply any  deodorants/lotions.  Please wear clean clothes to the hospital/surgery center.   Remember to brush your teeth WITH YOUR REGULAR TOOTHPASTE.   Please read over the following fact sheets that you were given.

## 2018-01-25 ENCOUNTER — Encounter (HOSPITAL_COMMUNITY): Payer: Self-pay

## 2018-01-25 ENCOUNTER — Encounter (HOSPITAL_COMMUNITY)
Admission: RE | Admit: 2018-01-25 | Discharge: 2018-01-25 | Disposition: A | Discharge status: Home / Self Care | Payer: Medicare HMO | Source: Ambulatory Visit | Attending: Neurosurgery | Admitting: Neurosurgery

## 2018-01-25 DIAGNOSIS — M1612 Unilateral primary osteoarthritis, left hip: Secondary | ICD-10-CM | POA: Diagnosis not present

## 2018-01-25 DIAGNOSIS — R0600 Dyspnea, unspecified: Secondary | ICD-10-CM | POA: Diagnosis not present

## 2018-01-25 DIAGNOSIS — Z9851 Tubal ligation status: Secondary | ICD-10-CM | POA: Diagnosis not present

## 2018-01-25 DIAGNOSIS — M4317 Spondylolisthesis, lumbosacral region: Secondary | ICD-10-CM | POA: Diagnosis not present

## 2018-01-25 DIAGNOSIS — D649 Anemia, unspecified: Secondary | ICD-10-CM | POA: Diagnosis not present

## 2018-01-25 DIAGNOSIS — I10 Essential (primary) hypertension: Secondary | ICD-10-CM | POA: Diagnosis not present

## 2018-01-25 DIAGNOSIS — E119 Type 2 diabetes mellitus without complications: Secondary | ICD-10-CM | POA: Insufficient documentation

## 2018-01-25 DIAGNOSIS — K219 Gastro-esophageal reflux disease without esophagitis: Secondary | ICD-10-CM | POA: Diagnosis not present

## 2018-01-25 DIAGNOSIS — Z9889 Other specified postprocedural states: Secondary | ICD-10-CM | POA: Insufficient documentation

## 2018-01-25 DIAGNOSIS — Z79899 Other long term (current) drug therapy: Secondary | ICD-10-CM | POA: Diagnosis not present

## 2018-01-25 DIAGNOSIS — E7800 Pure hypercholesterolemia, unspecified: Secondary | ICD-10-CM | POA: Insufficient documentation

## 2018-01-25 DIAGNOSIS — Z7984 Long term (current) use of oral hypoglycemic drugs: Secondary | ICD-10-CM | POA: Diagnosis not present

## 2018-01-25 DIAGNOSIS — Z01812 Encounter for preprocedural laboratory examination: Secondary | ICD-10-CM | POA: Diagnosis present

## 2018-01-25 DIAGNOSIS — J45909 Unspecified asthma, uncomplicated: Secondary | ICD-10-CM | POA: Diagnosis not present

## 2018-01-25 HISTORY — DX: Nonrheumatic aortic (valve) stenosis: I35.0

## 2018-01-25 HISTORY — DX: Dyspnea, unspecified: R06.00

## 2018-01-25 LAB — BASIC METABOLIC PANEL
Anion gap: 10 (ref 5–15)
BUN: 23 mg/dL — ABNORMAL HIGH (ref 6–20)
CO2: 27 mmol/L (ref 22–32)
Calcium: 10.2 mg/dL (ref 8.9–10.3)
Chloride: 105 mmol/L (ref 98–111)
Creatinine, Ser: 1.08 mg/dL — ABNORMAL HIGH (ref 0.44–1.00)
GFR calc Af Amer: >60 mL/min (ref >60)
GFR calc non Af Amer: 55 mL/min — ABNORMAL LOW (ref >60)
Glucose, Bld: 129 mg/dL — ABNORMAL HIGH (ref 70–99)
Potassium: 4 mmol/L (ref 3.5–5.1)
Sodium: 142 mmol/L (ref 135–145)

## 2018-01-25 LAB — SURGICAL PCR SCREEN
MRSA, PCR: NEGATIVE
Staphylococcus aureus: NEGATIVE

## 2018-01-25 LAB — CBC
HCT: 39.2 % (ref 36.0–46.0)
Hemoglobin: 11.5 g/dL — ABNORMAL LOW (ref 12.0–15.0)
MCH: 23.2 pg — ABNORMAL LOW (ref 26.0–34.0)
MCHC: 29.3 g/dL — ABNORMAL LOW (ref 30.0–36.0)
MCV: 79 fL (ref 78.0–100.0)
Platelets: 389 10*3/uL (ref 150–400)
RBC: 4.96 MIL/uL (ref 3.87–5.11)
RDW: 17.4 % — ABNORMAL HIGH (ref 11.5–15.5)
WBC: 7.6 10*3/uL (ref 4.0–10.5)

## 2018-01-25 LAB — ABO/RH: ABO/RH(D): O POS

## 2018-01-25 LAB — HEMOGLOBIN A1C
Hgb A1c MFr Bld: 7 % — ABNORMAL HIGH (ref 4.8–5.6)
Mean Plasma Glucose: 154.2 mg/dL

## 2018-01-25 LAB — TYPE AND SCREEN
ABO/RH(D): O POS
Antibody Screen: NEGATIVE

## 2018-01-25 LAB — GLUCOSE, CAPILLARY: Glucose-Capillary: 119 mg/dL — ABNORMAL HIGH (ref 70–99)

## 2018-01-25 MED ORDER — CHLORHEXIDINE GLUCONATE CLOTH 2 % EX PADS: 6.0000 | MEDICATED_PAD | Freq: Once | CUTANEOUS | Status: DC

## 2018-01-26 ENCOUNTER — Encounter (HOSPITAL_COMMUNITY): Payer: Self-pay

## 2018-01-26 ENCOUNTER — Telehealth: Payer: Self-pay | Admitting: Urgent Care

## 2018-01-26 NOTE — Telephone Encounter (Signed)
Called pt and left VM for her to call office and reschedule her appt with Freida Busman that is scheduled 03/31/18. When pt calls back, please reschedule her for:  6 MONTH RECHECK ON HER HYPERTENSION AND DIABETES  Thank you!

## 2018-01-26 NOTE — Progress Notes (Signed)
Anesthesia Chart Review:  Case:  [id number] Date/Time:  02/01/18 0845   Procedure:  Lumbar 4-5 Lumbar 5-Sacral 1 Posterior lumbar interbody fusion (N/A ) - Lumbar 4-5 Lumbar 5-Sacral 1 Posterior lumbar interbody fusion   Anesthesia type:  General   Pre-op diagnosis:  Spondylolisthesis, Lumbosacral region   Location:  [facility] OR ROOM 21 / [facility]   Surgeon:  Ashok Pall, MD      DISCUSSION: Patient is a 62 year old female scheduled for the above procedure. History includes never smoker, DM2, HTN, hypercholesterolemia, anemia, murmur/moderate AS (01/11/18 echo), GERD, asthma, dyspnea. BMI is consistent with morbid obesity.   Cardiologist Dr. Johnsie Cancel felt she was okay to proceed with surgery (see below). Based on currently available information, I would anticipate that she can proceed as planned if no acute changes.    VS: BP (!) 154/84   Pulse 78   Temp 36.6 C   Resp 20   Ht 4\' 10"  (1.473 m)   Wt 95.6 kg   SpO2 99%   BMI 44.06 kg/m   PROVIDERS: [name], PA-C is PCP (Primary Care at [facility]). Jenkins Rouge, MD is cardiologist. Last visit 12/27/17 for follow-up and preoperative evaluation. He felt she was "ok to proceed with surgery" but recommended echo to evaluate murmur which showed moderate AS with one year follow-up recommended.     LABS: Labs reviewed: Acceptable for surgery.  (all labs ordered are listed, but only abnormal results are displayed)  Labs Reviewed  GLUCOSE, CAPILLARY - Abnormal; Notable for the following components:      Result Value   Glucose-Capillary 119 (*)    All other components within normal limits  HEMOGLOBIN A1C - Abnormal; Notable for the following components:   Hgb A1c MFr Bld 7.0 (*)    All other components within normal limits  CBC - Abnormal; Notable for the following components:   Hemoglobin 11.5 (*)    MCH 23.2 (*)    MCHC 29.3 (*)    RDW 17.4 (*)    All other components within normal limits  BASIC METABOLIC PANEL - Abnormal; Notable for the  following components:   Glucose, Bld 129 (*)    BUN 23 (*)    Creatinine, Ser 1.08 (*)    GFR calc non Af Amer 55 (*)    All other components within normal limits  SURGICAL PCR SCREEN  TYPE AND SCREEN  ABO/RH    EKG: EKG 12/27/17: NSR, non-specific T wave abnormality.   CV: Echo 01/11/18: Study Conclusions - Left ventricle: The cavity size was normal. There was mild   concentric hypertrophy. Systolic function was vigorous. The   estimated ejection fraction was in the range of 65% to 70%. Wall   motion was normal; there were no regional wall motion   abnormalities. Doppler parameters are consistent with abnormal   left ventricular relaxation (grade 1 diastolic dysfunction).   Doppler parameters are consistent with high ventricular filling   pressure. - Aortic valve: Valve mobility was restricted. There was moderate   stenosis. There was no regurgitation. Peak velocity (S): 327   cm/s. Mean gradient (S): 26 mm Hg. Valve area (VTI): 0.99 cm^2.   Valve area (Vmax): 0.85 cm^2. Valve area (Vmean): 0.96 cm^2. - Mitral valve: Transvalvular velocity was within the normal range.   There was no evidence for stenosis. There was trivial   regurgitation. - Right ventricle: The cavity size was normal. Wall thickness was   normal. Systolic function was normal. - Atrial septum:  No defect or patent foramen ovale was identified   by color flow Doppler. - Tricuspid valve: There was no regurgitation. Impressions: - Global longitudinal strain tracking is inaccurate.   Past Medical History:  Diagnosis Date  . Allergy   . Anemia   . Anxiety   . Aortic stenosis    moderate AS (01/11/18 echo)  . Arthritis   . Asthma   . BMI 40.0-44.9, adult ([place]) 04/07/2014  . DM type 2 (diabetes mellitus, type 2) ([place])   . Dyspnea   . GERD (gastroesophageal reflux disease)   . Heart murmur   . HTN (hypertension)   . Hypercholesterolemia   . Osteoarthritis of left hip 04/07/2014  . Reflux     Past  Surgical History:  Procedure Laterality Date  . CESAREAN SECTION     x3  . COLONOSCOPY    . TUBAL LIGATION      MEDICATIONS: . albuterol (PROAIR HFA) 108 (90 BASE) MCG/ACT inhaler  . amLODipine (NORVASC) 10 MG tablet  . BLACK COHOSH PO  . celecoxib (CELEBREX) 200 MG capsule  . cetirizine (ZYRTEC) 10 MG tablet  . clobetasol ointment (TEMOVATE) 0.05 %  . fenofibrate 54 MG tablet  . fluticasone (FLONASE) 50 MCG/ACT nasal spray  . gabapentin (NEURONTIN) 300 MG capsule  . iron polysaccharides (NIFEREX) 150 MG capsule  . lansoprazole (PREVACID) 30 MG capsule  . losartan-hydrochlorothiazide (HYZAAR) 100-25 MG tablet  . metFORMIN (GLUCOPHAGE) 1000 MG tablet  . metoprolol succinate (TOPROL-XL) 100 MG 24 hr tablet  . Omega-3 Fatty Acids (FISH OIL PO)   . 0.9 %  sodium chloride infusion    [facility] Short Stay Center/Anesthesiology Phone [phone] 01/26/2018 10:20 AM

## 2018-01-31 ENCOUNTER — Encounter (HOSPITAL_COMMUNITY): Payer: Self-pay | Admitting: Certified Registered"

## 2018-01-31 NOTE — Anesthesia Preprocedure Evaluation (Addendum)
Anesthesia Evaluation  Patient identified by MRN, date of birth, ID band Patient awake    Reviewed: Allergy & Precautions, NPO status , Patient's Chart, lab work & pertinent test results  Airway Mallampati: I  TM Distance: >3 FB Neck ROM: Full    Dental no notable dental hx. (+) Teeth Intact, Dental Advisory Given   Pulmonary asthma ,    Pulmonary exam normal breath sounds clear to auscultation       Cardiovascular hypertension, Pt. on medications Normal cardiovascular exam+ Valvular Problems/Murmurs AS  Rhythm:Regular Rate:Normal + Systolic murmurs TTE 08/6501  ejection fraction was in the range of 65% to 70%. Wall  motion was normal; there were no regional wall motion  abnormalities. - Aortic valve: There was moderate stenosis. There was no regurgitation. Peak velocity (S): 327 cm/s. Mean gradient (S): 26 mm Hg. Valve area (VTI): 0.99 cm^2.   Valve area (Vmax): 0.85 cm^2. Valve area (Vmean): 0.96 cm^2.    Neuro/Psych Anxiety negative neurological ROS     GI/Hepatic Neg liver ROS, GERD  Medicated,  Endo/Other  diabetes, Type 2, Oral Hypoglycemic Agents  Renal/GU negative Renal ROS  negative genitourinary   Musculoskeletal  (+) Arthritis , Osteoarthritis,    Abdominal   Peds  Hematology  (+) anemia ,   Anesthesia Other Findings   Reproductive/Obstetrics                            Anesthesia Physical Anesthesia Plan  ASA: III  Anesthesia Plan: General   Post-op Pain Management:    Induction: Intravenous  PONV Risk Score and Plan: 3 and Dexamethasone, Ondansetron and Midazolam  Airway Management Planned: Oral ETT  Additional Equipment: Arterial line  Intra-op Plan:   Post-operative Plan: Extubation in OR  Informed Consent: I have reviewed the patients History and Physical, chart, labs and discussed the procedure including the risks, benefits and alternatives for the proposed  anesthesia with the patient or authorized representative who has indicated his/her understanding and acceptance.   Dental advisory given  Plan Discussed with: CRNA  Anesthesia Plan Comments:        Anesthesia Quick Evaluation

## 2018-02-01 ENCOUNTER — Encounter (HOSPITAL_COMMUNITY): Payer: Self-pay | Admitting: General Practice

## 2018-02-01 ENCOUNTER — Other Ambulatory Visit: Payer: Self-pay

## 2018-02-01 ENCOUNTER — Inpatient Hospital Stay (HOSPITAL_COMMUNITY): Payer: Medicare HMO | Admitting: Vascular Surgery

## 2018-02-01 ENCOUNTER — Inpatient Hospital Stay (HOSPITAL_COMMUNITY): Payer: Medicare HMO

## 2018-02-01 ENCOUNTER — Inpatient Hospital Stay (HOSPITAL_COMMUNITY): Payer: Medicare HMO | Admitting: Anesthesiology

## 2018-02-01 ENCOUNTER — Inpatient Hospital Stay (HOSPITAL_COMMUNITY)
Admission: RE | Admit: 2018-02-01 | Discharge: 2018-02-05 | DRG: 460 | Disposition: A | Discharge status: Home Health | Payer: Medicare HMO | Attending: Neurosurgery | Admitting: Neurosurgery

## 2018-02-01 ENCOUNTER — Inpatient Hospital Stay (HOSPITAL_COMMUNITY)
Admission: RE | Disposition: A | Discharge status: Home Health | Payer: Self-pay | Source: Home / Self Care | Attending: Neurosurgery

## 2018-02-01 DIAGNOSIS — I10 Essential (primary) hypertension: Secondary | ICD-10-CM | POA: Diagnosis present

## 2018-02-01 DIAGNOSIS — E7800 Pure hypercholesterolemia, unspecified: Secondary | ICD-10-CM | POA: Diagnosis present

## 2018-02-01 DIAGNOSIS — M4317 Spondylolisthesis, lumbosacral region: Principal | ICD-10-CM | POA: Diagnosis present

## 2018-02-01 DIAGNOSIS — M1990 Unspecified osteoarthritis, unspecified site: Secondary | ICD-10-CM | POA: Diagnosis present

## 2018-02-01 DIAGNOSIS — H9319 Tinnitus, unspecified ear: Secondary | ICD-10-CM | POA: Diagnosis present

## 2018-02-01 DIAGNOSIS — M549 Dorsalgia, unspecified: Secondary | ICD-10-CM | POA: Diagnosis present

## 2018-02-01 DIAGNOSIS — E119 Type 2 diabetes mellitus without complications: Secondary | ICD-10-CM | POA: Diagnosis present

## 2018-02-01 DIAGNOSIS — J45909 Unspecified asthma, uncomplicated: Secondary | ICD-10-CM | POA: Diagnosis present

## 2018-02-01 DIAGNOSIS — I350 Nonrheumatic aortic (valve) stenosis: Secondary | ICD-10-CM | POA: Diagnosis present

## 2018-02-01 DIAGNOSIS — Z23 Encounter for immunization: Secondary | ICD-10-CM | POA: Diagnosis present

## 2018-02-01 DIAGNOSIS — R61 Generalized hyperhidrosis: Secondary | ICD-10-CM | POA: Diagnosis present

## 2018-02-01 DIAGNOSIS — M48062 Spinal stenosis, lumbar region with neurogenic claudication: Secondary | ICD-10-CM | POA: Diagnosis present

## 2018-02-01 DIAGNOSIS — D649 Anemia, unspecified: Secondary | ICD-10-CM | POA: Diagnosis present

## 2018-02-01 DIAGNOSIS — Z7984 Long term (current) use of oral hypoglycemic drugs: Secondary | ICD-10-CM | POA: Diagnosis not present

## 2018-02-01 DIAGNOSIS — K219 Gastro-esophageal reflux disease without esophagitis: Secondary | ICD-10-CM | POA: Diagnosis present

## 2018-02-01 DIAGNOSIS — R011 Cardiac murmur, unspecified: Secondary | ICD-10-CM | POA: Diagnosis present

## 2018-02-01 DIAGNOSIS — Z419 Encounter for procedure for purposes other than remedying health state, unspecified: Secondary | ICD-10-CM

## 2018-02-01 DIAGNOSIS — G8929 Other chronic pain: Secondary | ICD-10-CM | POA: Diagnosis present

## 2018-02-01 DIAGNOSIS — G5711 Meralgia paresthetica, right lower limb: Secondary | ICD-10-CM | POA: Diagnosis not present

## 2018-02-01 DIAGNOSIS — Z6841 Body Mass Index (BMI) 40.0 and over, adult: Secondary | ICD-10-CM | POA: Diagnosis not present

## 2018-02-01 DIAGNOSIS — M4316 Spondylolisthesis, lumbar region: Secondary | ICD-10-CM | POA: Diagnosis present

## 2018-02-01 LAB — CREATININE, SERUM
Creatinine, Ser: 1.05 mg/dL — ABNORMAL HIGH (ref 0.44–1.00)
GFR calc Af Amer: >60 mL/min (ref >60)
GFR calc non Af Amer: 57 mL/min — ABNORMAL LOW (ref >60)

## 2018-02-01 LAB — CBC
HCT: 33.4 % — ABNORMAL LOW (ref 36.0–46.0)
Hemoglobin: 10.2 g/dL — ABNORMAL LOW (ref 12.0–15.0)
MCH: 23.9 pg — ABNORMAL LOW (ref 26.0–34.0)
MCHC: 30.5 g/dL (ref 30.0–36.0)
MCV: 78.2 fL (ref 78.0–100.0)
Platelets: 324 10*3/uL (ref 150–400)
RBC: 4.27 MIL/uL (ref 3.87–5.11)
RDW: 17.2 % — ABNORMAL HIGH (ref 11.5–15.5)
WBC: 10.1 10*3/uL (ref 4.0–10.5)

## 2018-02-01 LAB — GLUCOSE, CAPILLARY
Glucose-Capillary: 124 mg/dL — ABNORMAL HIGH (ref 70–99)
Glucose-Capillary: 152 mg/dL — ABNORMAL HIGH (ref 70–99)

## 2018-02-01 SURGERY — POSTERIOR LUMBAR FUSION 2 LEVEL
Anesthesia: General | Site: Back

## 2018-02-01 MED ORDER — BISACODYL 5 MG PO TBEC: 5.0000 mg | DELAYED_RELEASE_TABLET | Freq: Every day | ORAL | Status: DC | PRN

## 2018-02-01 MED ORDER — PROPOFOL 10 MG/ML IV BOLUS
INTRAVENOUS | Status: DC | PRN
  Administered 2018-02-01: 150 mg via INTRAVENOUS

## 2018-02-01 MED ORDER — LOSARTAN POTASSIUM 50 MG PO TABS
100.0000 mg | ORAL_TABLET | Freq: Every day | ORAL | Status: DC
  Administered 2018-02-02 – 2018-02-05 (×4): 100 mg via ORAL
  Filled 2018-02-01 (×5): qty 2

## 2018-02-01 MED ORDER — EPHEDRINE 5 MG/ML INJ
INTRAVENOUS | Status: AC
  Filled 2018-02-01: qty 10

## 2018-02-01 MED ORDER — ROCURONIUM BROMIDE 50 MG/5ML IV SOSY
PREFILLED_SYRINGE | INTRAVENOUS | Status: AC
  Filled 2018-02-01: qty 5

## 2018-02-01 MED ORDER — DIAZEPAM 5 MG PO TABS: 5.0000 mg | ORAL_TABLET | Freq: Four times a day (QID) | ORAL | Status: DC | PRN

## 2018-02-01 MED ORDER — POLYSACCHARIDE IRON COMPLEX 150 MG PO CAPS
150.0000 mg | ORAL_CAPSULE | Freq: Every day | ORAL | Status: DC
  Administered 2018-02-02 – 2018-02-05 (×4): 150 mg via ORAL
  Filled 2018-02-01 (×4): qty 1

## 2018-02-01 MED ORDER — FENTANYL CITRATE (PF) 250 MCG/5ML IJ SOLN
INTRAMUSCULAR | Status: DC | PRN
  Administered 2018-02-01 (×3): 50 ug via INTRAVENOUS

## 2018-02-01 MED ORDER — ONDANSETRON HCL 4 MG/2ML IJ SOLN
INTRAMUSCULAR | Status: AC
  Filled 2018-02-01: qty 2

## 2018-02-01 MED ORDER — SODIUM CHLORIDE 0.9% FLUSH
3.0000 mL | Freq: Two times a day (BID) | INTRAVENOUS | Status: DC
  Administered 2018-02-03 – 2018-02-05 (×5): 3 mL via INTRAVENOUS

## 2018-02-01 MED ORDER — SUCCINYLCHOLINE CHLORIDE 200 MG/10ML IV SOSY
PREFILLED_SYRINGE | INTRAVENOUS | Status: AC
  Filled 2018-02-01: qty 10

## 2018-02-01 MED ORDER — PHENOL 1.4 % MT LIQD: 1.0000 | OROMUCOSAL | Status: DC | PRN

## 2018-02-01 MED ORDER — LOSARTAN POTASSIUM-HCTZ 100-25 MG PO TABS: 1.0000 | ORAL_TABLET | Freq: Every day | ORAL | Status: DC

## 2018-02-01 MED ORDER — SODIUM CHLORIDE 0.9% FLUSH: 3.0000 mL | INTRAVENOUS | Status: DC | PRN

## 2018-02-01 MED ORDER — MAGNESIUM CITRATE PO SOLN: 1.0000 | Freq: Once | ORAL | Status: DC | PRN

## 2018-02-01 MED ORDER — ACETAMINOPHEN 325 MG PO TABS
650.0000 mg | ORAL_TABLET | ORAL | Status: DC | PRN
  Administered 2018-02-03: 650 mg via ORAL
  Filled 2018-02-01: qty 2

## 2018-02-01 MED ORDER — HYDROMORPHONE HCL 1 MG/ML IJ SOLN
INTRAMUSCULAR | Status: AC
  Filled 2018-02-01: qty 1

## 2018-02-01 MED ORDER — AMLODIPINE BESYLATE 10 MG PO TABS
10.0000 mg | ORAL_TABLET | Freq: Every day | ORAL | Status: DC
  Administered 2018-02-01 – 2018-02-04 (×4): 10 mg via ORAL
  Filled 2018-02-01 (×6): qty 1

## 2018-02-01 MED ORDER — SODIUM CHLORIDE 0.9 % IV SOLN
INTRAVENOUS | Status: DC | PRN
  Administered 2018-02-01: 15:00:00 via INTRAVENOUS

## 2018-02-01 MED ORDER — HYDROMORPHONE HCL 1 MG/ML IJ SOLN
0.2500 mg | INTRAMUSCULAR | Status: DC | PRN
  Administered 2018-02-01 (×2): 0.5 mg via INTRAVENOUS

## 2018-02-01 MED ORDER — ROCURONIUM BROMIDE 50 MG/5ML IV SOSY
PREFILLED_SYRINGE | INTRAVENOUS | Status: AC
  Filled 2018-02-01: qty 20

## 2018-02-01 MED ORDER — ONDANSETRON HCL 4 MG PO TABS
4.0000 mg | ORAL_TABLET | Freq: Four times a day (QID) | ORAL | Status: DC | PRN
  Administered 2018-02-04 – 2018-02-05 (×3): 4 mg via ORAL
  Filled 2018-02-01 (×4): qty 1

## 2018-02-01 MED ORDER — FENOFIBRATE 54 MG PO TABS
54.0000 mg | ORAL_TABLET | Freq: Every day | ORAL | Status: DC
  Administered 2018-02-02 – 2018-02-05 (×4): 54 mg via ORAL
  Filled 2018-02-01 (×4): qty 1

## 2018-02-01 MED ORDER — LACTATED RINGERS IV SOLN
INTRAVENOUS | Status: DC
  Administered 2018-02-01 (×4): via INTRAVENOUS

## 2018-02-01 MED ORDER — BUPIVACAINE HCL (PF) 0.5 % IJ SOLN
INTRAMUSCULAR | Status: DC | PRN
  Administered 2018-02-01: 30 mL

## 2018-02-01 MED ORDER — MIDAZOLAM HCL 2 MG/2ML IJ SOLN
INTRAMUSCULAR | Status: AC
  Filled 2018-02-01: qty 2

## 2018-02-01 MED ORDER — CEFAZOLIN SODIUM-DEXTROSE 1-4 GM/50ML-% IV SOLN
1.0000 g | Freq: Three times a day (TID) | INTRAVENOUS | Status: AC
  Administered 2018-02-01 – 2018-02-02 (×2): 1 g via INTRAVENOUS
  Filled 2018-02-01 (×2): qty 50

## 2018-02-01 MED ORDER — SODIUM CHLORIDE 0.9 % IV SOLN
INTRAVENOUS | Status: DC | PRN
  Administered 2018-02-01: 50 ug/min via INTRAVENOUS

## 2018-02-01 MED ORDER — FLUTICASONE PROPIONATE 50 MCG/ACT NA SUSP: 2.0000 | Freq: Every day | NASAL | Status: DC

## 2018-02-01 MED ORDER — ZOLPIDEM TARTRATE 5 MG PO TABS: 5.0000 mg | ORAL_TABLET | Freq: Every evening | ORAL | Status: DC | PRN

## 2018-02-01 MED ORDER — ALBUTEROL SULFATE (2.5 MG/3ML) 0.083% IN NEBU: 2.5000 mg | INHALATION_SOLUTION | Freq: Four times a day (QID) | RESPIRATORY_TRACT | Status: DC | PRN

## 2018-02-01 MED ORDER — ONDANSETRON HCL 4 MG/2ML IJ SOLN
INTRAMUSCULAR | Status: DC | PRN
  Administered 2018-02-01: 4 mg via INTRAVENOUS

## 2018-02-01 MED ORDER — THROMBIN 20000 UNITS EX SOLR
CUTANEOUS | Status: DC | PRN
  Administered 2018-02-01: 20 mL via TOPICAL

## 2018-02-01 MED ORDER — THROMBIN 20000 UNITS EX KIT
PACK | CUTANEOUS | Status: AC
  Filled 2018-02-01: qty 1

## 2018-02-01 MED ORDER — ROCURONIUM BROMIDE 10 MG/ML (PF) SYRINGE
PREFILLED_SYRINGE | INTRAVENOUS | Status: DC | PRN
  Administered 2018-02-01: 10 mg via INTRAVENOUS
  Administered 2018-02-01: 20 mg via INTRAVENOUS
  Administered 2018-02-01: 10 mg via INTRAVENOUS
  Administered 2018-02-01: 40 mg via INTRAVENOUS
  Administered 2018-02-01: 20 mg via INTRAVENOUS

## 2018-02-01 MED ORDER — DEXAMETHASONE SODIUM PHOSPHATE 10 MG/ML IJ SOLN
INTRAMUSCULAR | Status: DC | PRN
  Administered 2018-02-01: 10 mg via INTRAVENOUS

## 2018-02-01 MED ORDER — HEPARIN SODIUM (PORCINE) 5000 UNIT/ML IJ SOLN
5000.0000 [IU] | Freq: Three times a day (TID) | INTRAMUSCULAR | Status: DC
  Administered 2018-02-01 – 2018-02-05 (×11): 5000 [IU] via SUBCUTANEOUS
  Filled 2018-02-01 (×11): qty 1

## 2018-02-01 MED ORDER — METOPROLOL SUCCINATE ER 100 MG PO TB24
100.0000 mg | ORAL_TABLET | Freq: Every day | ORAL | Status: DC
  Administered 2018-02-01 – 2018-02-04 (×3): 100 mg via ORAL
  Filled 2018-02-01 (×5): qty 1

## 2018-02-01 MED ORDER — 0.9 % SODIUM CHLORIDE (POUR BTL) OPTIME
TOPICAL | Status: DC | PRN
  Administered 2018-02-01: 1000 mL

## 2018-02-01 MED ORDER — OXYCODONE HCL 5 MG PO TABS
10.0000 mg | ORAL_TABLET | ORAL | Status: DC | PRN
  Administered 2018-02-01 – 2018-02-03 (×5): 10 mg via ORAL
  Filled 2018-02-01 (×6): qty 2

## 2018-02-01 MED ORDER — MORPHINE SULFATE (PF) 2 MG/ML IV SOLN
2.0000 mg | INTRAVENOUS | Status: DC | PRN
  Administered 2018-02-01: 2 mg via INTRAVENOUS
  Filled 2018-02-01: qty 1

## 2018-02-01 MED ORDER — CEFAZOLIN SODIUM-DEXTROSE 2-4 GM/100ML-% IV SOLN
2.0000 g | INTRAVENOUS | Status: AC
  Administered 2018-02-01: 2 g via INTRAVENOUS
  Filled 2018-02-01: qty 100

## 2018-02-01 MED ORDER — ONDANSETRON HCL 4 MG/2ML IJ SOLN: 4.0000 mg | Freq: Four times a day (QID) | INTRAMUSCULAR | Status: DC | PRN

## 2018-02-01 MED ORDER — MIDAZOLAM HCL 5 MG/5ML IJ SOLN
INTRAMUSCULAR | Status: DC | PRN
  Administered 2018-02-01: 2 mg via INTRAVENOUS

## 2018-02-01 MED ORDER — LORATADINE 10 MG PO TABS
10.0000 mg | ORAL_TABLET | Freq: Every day | ORAL | Status: DC
  Administered 2018-02-01 – 2018-02-05 (×5): 10 mg via ORAL
  Filled 2018-02-01 (×5): qty 1

## 2018-02-01 MED ORDER — SUGAMMADEX SODIUM 200 MG/2ML IV SOLN
INTRAVENOUS | Status: DC | PRN
  Administered 2018-02-01: 200 mg via INTRAVENOUS

## 2018-02-01 MED ORDER — GABAPENTIN 300 MG PO CAPS
300.0000 mg | ORAL_CAPSULE | Freq: Three times a day (TID) | ORAL | Status: DC
  Administered 2018-02-01 – 2018-02-05 (×12): 300 mg via ORAL
  Filled 2018-02-01 (×12): qty 1

## 2018-02-01 MED ORDER — POTASSIUM CHLORIDE IN NACL 20-0.9 MEQ/L-% IV SOLN: INTRAVENOUS | Status: DC

## 2018-02-01 MED ORDER — LIDOCAINE-EPINEPHRINE 0.5 %-1:200000 IJ SOLN
INTRAMUSCULAR | Status: AC
  Filled 2018-02-01: qty 1

## 2018-02-01 MED ORDER — PANTOPRAZOLE SODIUM 40 MG PO TBEC
40.0000 mg | DELAYED_RELEASE_TABLET | Freq: Every day | ORAL | Status: DC
  Administered 2018-02-01 – 2018-02-05 (×5): 40 mg via ORAL
  Filled 2018-02-01 (×5): qty 1

## 2018-02-01 MED ORDER — SENNOSIDES-DOCUSATE SODIUM 8.6-50 MG PO TABS
1.0000 | ORAL_TABLET | Freq: Every evening | ORAL | Status: DC | PRN
  Administered 2018-02-04: 1 via ORAL

## 2018-02-01 MED ORDER — DEXAMETHASONE SODIUM PHOSPHATE 10 MG/ML IJ SOLN
INTRAMUSCULAR | Status: AC
  Filled 2018-02-01: qty 1

## 2018-02-01 MED ORDER — LIDOCAINE-EPINEPHRINE 0.5 %-1:200000 IJ SOLN
INTRAMUSCULAR | Status: DC | PRN
  Administered 2018-02-01: 10 mL

## 2018-02-01 MED ORDER — LIDOCAINE 2% (20 MG/ML) 5 ML SYRINGE
INTRAMUSCULAR | Status: DC | PRN
  Administered 2018-02-01: 100 mg via INTRAVENOUS

## 2018-02-01 MED ORDER — OXYCODONE HCL ER 10 MG PO T12A
10.0000 mg | EXTENDED_RELEASE_TABLET | Freq: Two times a day (BID) | ORAL | Status: DC
  Administered 2018-02-01 – 2018-02-05 (×8): 10 mg via ORAL
  Filled 2018-02-01 (×9): qty 1

## 2018-02-01 MED ORDER — OXYCODONE HCL 5 MG PO TABS
5.0000 mg | ORAL_TABLET | ORAL | Status: DC | PRN
  Administered 2018-02-01 – 2018-02-03 (×4): 5 mg via ORAL
  Filled 2018-02-01 (×4): qty 1

## 2018-02-01 MED ORDER — CLOBETASOL PROPIONATE 0.05 % EX OINT
1.0000 "application " | TOPICAL_OINTMENT | CUTANEOUS | Status: DC
  Administered 2018-02-05: 1 via TOPICAL
  Filled 2018-02-01: qty 15

## 2018-02-01 MED ORDER — METFORMIN HCL 500 MG PO TABS
1000.0000 mg | ORAL_TABLET | Freq: Two times a day (BID) | ORAL | Status: DC
  Administered 2018-02-02 – 2018-02-05 (×7): 1000 mg via ORAL
  Filled 2018-02-01 (×7): qty 2

## 2018-02-01 MED ORDER — ACETAMINOPHEN 650 MG RE SUPP: 650.0000 mg | RECTAL | Status: DC | PRN

## 2018-02-01 MED ORDER — OMEGA-3-ACID ETHYL ESTERS 1 G PO CAPS
1.0000 g | ORAL_CAPSULE | Freq: Every day | ORAL | Status: DC
  Administered 2018-02-01 – 2018-02-05 (×5): 1 g via ORAL
  Filled 2018-02-01 (×5): qty 1

## 2018-02-01 MED ORDER — LIDOCAINE 2% (20 MG/ML) 5 ML SYRINGE
INTRAMUSCULAR | Status: AC
  Filled 2018-02-01: qty 5

## 2018-02-01 MED ORDER — SODIUM CHLORIDE 0.9 % IV SOLN: 250.0000 mL | INTRAVENOUS | Status: DC

## 2018-02-01 MED ORDER — MENTHOL 3 MG MT LOZG: 1.0000 | LOZENGE | OROMUCOSAL | Status: DC | PRN

## 2018-02-01 MED ORDER — CELECOXIB 200 MG PO CAPS
200.0000 mg | ORAL_CAPSULE | Freq: Two times a day (BID) | ORAL | Status: DC
  Administered 2018-02-01 – 2018-02-05 (×8): 200 mg via ORAL
  Filled 2018-02-01 (×10): qty 1

## 2018-02-01 MED ORDER — PROPOFOL 10 MG/ML IV BOLUS
INTRAVENOUS | Status: AC
  Filled 2018-02-01: qty 20

## 2018-02-01 MED ORDER — SENNA 8.6 MG PO TABS
1.0000 | ORAL_TABLET | Freq: Two times a day (BID) | ORAL | Status: DC
  Administered 2018-02-01 – 2018-02-05 (×8): 8.6 mg via ORAL
  Filled 2018-02-01 (×8): qty 1

## 2018-02-01 MED ORDER — PHENYLEPHRINE 40 MCG/ML (10ML) SYRINGE FOR IV PUSH (FOR BLOOD PRESSURE SUPPORT)
PREFILLED_SYRINGE | INTRAVENOUS | Status: AC
  Filled 2018-02-01: qty 10

## 2018-02-01 MED ORDER — BUPIVACAINE HCL (PF) 0.5 % IJ SOLN
INTRAMUSCULAR | Status: AC
  Filled 2018-02-01: qty 30

## 2018-02-01 MED ORDER — ALBUTEROL SULFATE HFA 108 (90 BASE) MCG/ACT IN AERS: 2.0000 | INHALATION_SPRAY | Freq: Four times a day (QID) | RESPIRATORY_TRACT | Status: DC | PRN

## 2018-02-01 MED ORDER — FENTANYL CITRATE (PF) 250 MCG/5ML IJ SOLN
INTRAMUSCULAR | Status: AC
  Filled 2018-02-01: qty 5

## 2018-02-01 MED ORDER — ACETAMINOPHEN 500 MG PO TABS
1000.0000 mg | ORAL_TABLET | Freq: Four times a day (QID) | ORAL | Status: AC
  Administered 2018-02-01 – 2018-02-02 (×4): 1000 mg via ORAL
  Filled 2018-02-01 (×4): qty 2

## 2018-02-01 MED ORDER — HYDROCHLOROTHIAZIDE 25 MG PO TABS
25.0000 mg | ORAL_TABLET | Freq: Every day | ORAL | Status: DC
  Administered 2018-02-01 – 2018-02-05 (×5): 25 mg via ORAL
  Filled 2018-02-01 (×5): qty 1

## 2018-02-01 SURGICAL SUPPLY — 72 items
ADH SKN CLS APL DERMABOND .7 (GAUZE/BANDAGES/DRESSINGS) ×1
APL SKNCLS STERI-STRIP NONHPOA (GAUZE/BANDAGES/DRESSINGS)
BAG DECANTER FOR FLEXI CONT (MISCELLANEOUS) ×3 IMPLANT
BENZOIN TINCTURE PRP APPL 2/3 (GAUZE/BANDAGES/DRESSINGS) IMPLANT
BLADE CLIPPER SURG (BLADE) IMPLANT
BUR MATCHSTICK NEURO 3.0 LAGG (BURR) ×3 IMPLANT
BUR PRECISION FLUTE 5.0 (BURR) ×3 IMPLANT
CAGE POST IBF 10X8D 26/9 (Cage) ×8 IMPLANT
CANISTER SUCT 3000ML PPV (MISCELLANEOUS) ×3 IMPLANT
CAP RELINE MOD TULIP RMM (Cap) ×12 IMPLANT
CARTRIDGE OIL MAESTRO DRILL (MISCELLANEOUS) ×1 IMPLANT
CLOSURE WOUND 1/2 X4 (GAUZE/BANDAGES/DRESSINGS)
CONT SPEC 4OZ CLIKSEAL STRL BL (MISCELLANEOUS) ×3 IMPLANT
COVER BACK TABLE 60X90IN (DRAPES) IMPLANT
DECANTER SPIKE VIAL GLASS SM (MISCELLANEOUS) ×3 IMPLANT
DERMABOND ADVANCED (GAUZE/BANDAGES/DRESSINGS) ×2
DERMABOND ADVANCED .7 DNX12 (GAUZE/BANDAGES/DRESSINGS) ×1 IMPLANT
DIFFUSER DRILL AIR PNEUMATIC (MISCELLANEOUS) ×3 IMPLANT
DRAPE C-ARM 42X72 X-RAY (DRAPES) ×6 IMPLANT
DRAPE C-ARMOR (DRAPES) IMPLANT
DRAPE LAPAROTOMY 100X72X124 (DRAPES) ×3 IMPLANT
DRAPE POUCH INSTRU U-SHP 10X18 (DRAPES) ×3 IMPLANT
DRAPE SURG 17X23 STRL (DRAPES) ×3 IMPLANT
DRSG OPSITE POSTOP 4X6 (GAUZE/BANDAGES/DRESSINGS) ×2 IMPLANT
DURAPREP 26ML APPLICATOR (WOUND CARE) ×3 IMPLANT
ELECT REM PT RETURN 9FT ADLT (ELECTROSURGICAL) ×3
ELECTRODE REM PT RTRN 9FT ADLT (ELECTROSURGICAL) ×1 IMPLANT
GAUZE 4X4 16PLY RFD (DISPOSABLE) IMPLANT
GAUZE SPONGE 4X4 12PLY STRL (GAUZE/BANDAGES/DRESSINGS) IMPLANT
GLOVE BIO SURGEON STRL SZ8 (GLOVE) ×2 IMPLANT
GLOVE ECLIPSE 6.5 STRL STRAW (GLOVE) ×6 IMPLANT
GLOVE EXAM NITRILE LRG STRL (GLOVE) IMPLANT
GLOVE EXAM NITRILE XL STR (GLOVE) IMPLANT
GLOVE EXAM NITRILE XS STR PU (GLOVE) IMPLANT
GLOVE INDICATOR 8.5 STRL (GLOVE) ×2 IMPLANT
GOWN STRL REUS W/ TWL LRG LVL3 (GOWN DISPOSABLE) ×2 IMPLANT
GOWN STRL REUS W/ TWL XL LVL3 (GOWN DISPOSABLE) IMPLANT
GOWN STRL REUS W/TWL 2XL LVL3 (GOWN DISPOSABLE) IMPLANT
GOWN STRL REUS W/TWL LRG LVL3 (GOWN DISPOSABLE) ×6
GOWN STRL REUS W/TWL XL LVL3 (GOWN DISPOSABLE)
GRAFT DURAGEN MATRIX 1WX1L (Tissue) ×2 IMPLANT
KIT BASIN OR (CUSTOM PROCEDURE TRAY) ×3 IMPLANT
KIT POSITION SURG JACKSON T1 (MISCELLANEOUS) ×3 IMPLANT
KIT TURNOVER KIT B (KITS) ×3 IMPLANT
MILL MEDIUM DISP (BLADE) ×2 IMPLANT
NDL HYPO 25X1 1.5 SAFETY (NEEDLE) ×1 IMPLANT
NDL SPNL 18GX3.5 QUINCKE PK (NEEDLE) IMPLANT
NEEDLE HYPO 25X1 1.5 SAFETY (NEEDLE) ×3 IMPLANT
NEEDLE SPNL 18GX3.5 QUINCKE PK (NEEDLE) IMPLANT
NS IRRIG 1000ML POUR BTL (IV SOLUTION) ×3 IMPLANT
OIL CARTRIDGE MAESTRO DRILL (MISCELLANEOUS) ×3
PACK LAMINECTOMY NEURO (CUSTOM PROCEDURE TRAY) ×3 IMPLANT
PAD ARMBOARD 7.5X6 YLW CONV (MISCELLANEOUS) ×9 IMPLANT
ROD RELINE COCR 5.0X60MM (Rod) ×2 IMPLANT
ROD RELINE-O COCR 5.0X55MM (Rod) ×2 IMPLANT
SCREW LOCK RSS 4.5/5.0MM (Screw) ×12 IMPLANT
SCREW SHANK RELINE MOD 5.0X30 (Screw) ×2 IMPLANT
SCREW SHANK RELINE MOD 5.0X35 (Screw) ×2 IMPLANT
SCREW SHANK RELINE MOD 5.5X30 (Screw) ×6 IMPLANT
SCREW SHANK RELINE MOD 5.5X35 (Screw) ×2 IMPLANT
SPONGE LAP 4X18 RFD (DISPOSABLE) IMPLANT
SPONGE SURGIFOAM ABS GEL 100 (HEMOSTASIS) ×3 IMPLANT
STRIP CLOSURE SKIN 1/2X4 (GAUZE/BANDAGES/DRESSINGS) IMPLANT
SUT PROLENE 5 0 C1 (SUTURE) ×2 IMPLANT
SUT PROLENE 6 0 BV (SUTURE) IMPLANT
SUT VIC AB 0 CT1 18XCR BRD8 (SUTURE) ×1 IMPLANT
SUT VIC AB 0 CT1 8-18 (SUTURE) ×6
SUT VIC AB 2-0 CT1 18 (SUTURE) ×3 IMPLANT
SUT VIC AB 3-0 SH 8-18 (SUTURE) ×5 IMPLANT
TOWEL GREEN STERILE (TOWEL DISPOSABLE) ×3 IMPLANT
TOWEL GREEN STERILE FF (TOWEL DISPOSABLE) ×3 IMPLANT
WATER STERILE IRR 1000ML POUR (IV SOLUTION) ×3 IMPLANT

## 2018-02-01 NOTE — Anesthesia Procedure Notes (Signed)
Procedure Name: Intubation Date/Time: 02/01/2018 9:13 AM Performed by: Myna Bright, CRNA Pre-anesthesia Checklist: Patient identified, Emergency Drugs available, Suction available and Patient being monitored Patient Re-evaluated:Patient Re-evaluated prior to induction Oxygen Delivery Method: Circle system utilized Preoxygenation: Pre-oxygenation with 100% oxygen Induction Type: IV induction Ventilation: Mask ventilation without difficulty Laryngoscope Size: Mac and 3 Grade View: Grade II Tube type: Oral Tube size: 7.0 mm Number of attempts: 1 Airway Equipment and Method: Stylet Placement Confirmation: ETT inserted through vocal cords under direct vision,  positive ETCO2 and breath sounds checked- equal and bilateral Secured at: 20 cm Tube secured with: Tape Dental Injury: Teeth and Oropharynx as per pre-operative assessment

## 2018-02-01 NOTE — Transfer of Care (Signed)
Immediate Anesthesia Transfer of Care Note  Patient: [name]  Procedure(s) Performed: Lumbar Four-Five Lumbar Five-Sacral One Posterior lumbar interbody fusion (N/A Back)  Patient Location: PACU  Anesthesia Type:General  Level of Consciousness: awake, alert , oriented and patient cooperative  Airway & Oxygen Therapy: Patient Spontanous Breathing and Patient connected to nasal cannula oxygen  Post-op Assessment: Report given to RN, Post -op Vital signs reviewed and stable and Patient moving all extremities  Post vital signs: Reviewed and stable  Last Vitals:  Vitals Value Taken Time  BP 135/67 02/01/2018  2:53 PM  Temp    Pulse 89 02/01/2018  2:57 PM  Resp 17 02/01/2018  2:56 PM  SpO2 100 % 02/01/2018  2:57 PM  Vitals shown include unvalidated device data.  Last Pain:  Vitals:   02/01/18 0801  TempSrc:   PainSc: 5       Patients Stated Pain Goal: 3 (46/50/35 4656)  Complications: No apparent anesthesia complications

## 2018-02-01 NOTE — Op Note (Signed)
BP (!) 166/67   Pulse 84   Temp (!) 97.2 F (36.2 C)   Resp 20   Ht 4\' 10"  (1.473 m)   Wt 95.6 kg   SpO2 97%   BMI 44.06 kg/m  02/01/2018  3:10 PM  PATIENT:  [name]  62 y.o. female with spondylolisthesis, facet arthropathy, and spinal stenosis, causing neurogenic claudication. She has agreed to a lumbar decompression and arthrodesis.   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 Lumbar Four-Five Lumbar Five-Sacral One Posterior lumbar interbody fusion, synthes titanium cages with autograft morsels, and autograft morsels placed in the disc spaces Segmental pedicle screw fixation L4-S1(Nuvasive mas plif) SURGEON:  Surgeon([name]): Ashok Pall, MD Kary Kos, MD  ASSISTANTS:Cram, Dominica Severin  ANESTHESIA:   general  EBL:  Total I/O In: 2400 [I.V.:2250; Blood:150] Out: 24 [Urine:340; Blood:400]  BLOOD ADMINISTERED:100 CC CELLSAVER  CELL SAVER GIVEN:yes  COUNT:per nursing  DRAINS: none   SPECIMEN:  No Specimen  DICTATION: Olivia HOMESLEY is a 62 y.o. female whom was taken to the operating room intubated, and placed under a general anesthetic without difficulty. A foley catheter was placed under sterile conditions. She was positioned prone on a Jackson stable with all pressure points properly padded.  Her lumbar region was prepped and draped in a sterile manner. I infiltrated 10cc'[name] 1/2%lidocaine/1:2000,000 strength epinephrine into the planned incision. I opened the skin with a 10 blade and took the incision down to the thoracolumbar fascia. I exposed the lamina of  L3,4,5,and S1 in a subperiosteal fashion bilaterally. I confirmed my location with an intraoperative xray.  I placed self retaining retractors and started the decompression.  We decompressed the spinal canal via complete laminectomies of L4, and L5, and a hemilaminectomy at S1. I also performed inferior facetectomies of L4, and L5 bilaterally. This allowed for complete decompression of the L4, and L5 roots PLIF'[name] were performed by Dr. Saintclair Halsted and I at L4/5, L5/S1 in the same fashion. I opened the disc space with a 15 blade then used a variety of instruments to remove the disc and prepare the space for the arthrodesis. I used curettes, rongeurs, punches, shavers for the disc space, and rasps in the discetomy. I measured the disc space and placed 10x62mm cages at both levels bilaterally. (Synthes) I placed pedicle screws at L4,5,and S1, using fluoroscopic guidance. I drilled a pilot hole, then cannulated the pedicle with a drill at each site. I then tapped each pedicle, assessing each site for pedicle violations. No cutouts were appreciated. Screws (nuvasive mas plif) were then placed at each site without difficulty. I attached rods and locking caps with the appropriate tools. The locking caps were secured with torque limited screwdrivers. Final films were performed and the final construct appeared to be in good position. I placed a piece of duragen over exposed arachnoid. There was no leak observed. I closed the wound in a layered fashion. I approximated the thoracolumbar fascia, subcutaneous, and subcuticular planes with vicryl sutures. I used dermabond for a sterile dressing.     PLAN OF CARE: Admit to inpatient   PATIENT DISPOSITION:  PACU - hemodynamically stable.   Delay start of Pharmacological VTE agent (>24hrs) due to  surgical blood loss or risk of bleeding:  yes

## 2018-02-01 NOTE — H&P (Signed)
BP (!) 166/67   Pulse 84   Temp 98.4 F (36.9 C) (Oral)   Resp 20   Ht 4\' 10"  (1.473 m)   Wt 95.6 kg   SpO2 97%   BMI 44.06 kg/m \    [name] comes in today for evaluation of pain which she has had for about 10 years in the lower back and right lower extremity.  She says that pain has gotten worse over the last year.  She has had injections.  She has had physical therapy.  She has had pain medications.  None of these have been beneficial.  She says over the last 6 months, she has had increasing pain and she reports that her bladder feels pretty weak.  As of today, she says the pain is approximately 7/10 in the lower back.  She is 62 years of age, is right-handed, does not smoke, does not use alcohol.  She provided no history about substance abuse.  She is married.   PAST MEDICAL HISTORY: Includes hypertension and diabetes.  She has undergone 3 cesarean sections.   ALLERGIES: To sulfa containing medications and they cause her to swell.    She takes losartan,  poly-iron, metoprolol, gabapentin, metformin, amlodipine, celecoxib, lansoprazole.  She provides no information about her mother or father.  She says she has chronic back pain and she feels that lifting is what started the pain.  She states that she has pain in the legs and back along with a weak bladder, numbness and tingling in the right lower extremity.  Weight has been stable.   REVIEW OF SYSTEMS: Positive for night sweats, tinnitus, heart murmur, hypercholesterolemia, leg pain with walking, back pain, leg pain.   EXAM: She is alert, oriented by 4.  She answers all questions appropriately.  Memory, language, attention span, and fund of knowledge are normal.  Speech is clear. It is also fluent.  Hearing intact to voice.  Uvula elevates midline.  Shoulder shrug is normal.  Tongue protrudes in the midline.  She has 5/5 strength in the upper and lower extremities.  Normal muscle tone, bulk, coordination.  Romberg is negative.   Reflexes 1+ at the knees, trace at the ankles, 2+ biceps, triceps, brachioradialis.  Intact proprioception in both the upper and lower extremities.  Gait is otherwise normal.  She can toe walk.  She can heel walk.      Film shows significant facet arthropathy at L4-5 and L5-S1 with crowding for sure of the lateral recesses and neural foramina bilaterally.  She is mildly stenotic at these levels, but the facet hypertrophy is what is most impressive.    I think if anything more to be done since she has had all conservative means of treatment is that she would undergo a 2-level lumbar arthrodesis and decompression.  This would involve pedicle screws and rods, interbody cages.  I believe this would be her best effort because I think to do a simple laminectomy with the degree of facet arthropathy that she has and the anterolisthesis of 4-5 that she is already displaying that my operation would lead toward great instability and more problems in the future.  She will give this consideration and then contact us.

## 2018-02-01 NOTE — Anesthesia Procedure Notes (Signed)
Arterial Line Insertion Start/End9/03/2018 8:45 AM, 02/01/2018 8:55 AM Performed by: Kyung Rudd, CRNA, CRNA  Preanesthetic checklist: patient identified, IV checked, site marked, risks and benefits discussed, surgical consent, monitors and equipment checked, pre-op evaluation and timeout performed Lidocaine 1% used for infiltration and patient sedated Right, radial was placed Catheter size: 20 G Hand hygiene performed , maximum sterile barriers used  and Seldinger technique used Allen's test indicative of satisfactory collateral circulation Attempts: 1 Procedure performed without using ultrasound guided technique. Following insertion, Biopatch and dressing applied. Post procedure assessment: normal  Patient tolerated the procedure well with no immediate complications.

## 2018-02-01 NOTE — Progress Notes (Signed)
Pt arrived to unit

## 2018-02-02 LAB — GLUCOSE, CAPILLARY
Glucose-Capillary: 162 mg/dL — ABNORMAL HIGH (ref 70–99)
Glucose-Capillary: 168 mg/dL — ABNORMAL HIGH (ref 70–99)
Glucose-Capillary: 109 mg/dL — ABNORMAL HIGH (ref 70–99)
Glucose-Capillary: 127 mg/dL — ABNORMAL HIGH (ref 70–99)

## 2018-02-02 MED ORDER — PNEUMOCOCCAL VAC POLYVALENT 25 MCG/0.5ML IJ INJ
0.5000 mL | INJECTION | INTRAMUSCULAR | Status: AC
  Administered 2018-02-05: 0.5 mL via INTRAMUSCULAR
  Filled 2018-02-02: qty 0.5

## 2018-02-02 MED ORDER — INSULIN ASPART 100 UNIT/ML ~~LOC~~ SOLN
0.0000 [IU] | SUBCUTANEOUS | Status: DC
  Administered 2018-02-02: 4 [IU] via SUBCUTANEOUS

## 2018-02-02 MED ORDER — INFLUENZA VAC SPLIT QUAD 0.5 ML IM SUSY
0.5000 mL | PREFILLED_SYRINGE | INTRAMUSCULAR | Status: AC
  Administered 2018-02-05: 0.5 mL via INTRAMUSCULAR
  Filled 2018-02-02: qty 0.5

## 2018-02-02 MED FILL — Thrombin For Soln Kit 20000 Unit: CUTANEOUS | Qty: 1 | Status: AC

## 2018-02-02 NOTE — Progress Notes (Signed)
Called pharmacy in regards to medications, inquired about metformin. Pharmacist on call gave verbal order to hold metformin until orders are clarified. Metformin held per pharmacist

## 2018-02-02 NOTE — Progress Notes (Signed)
Patient ID: [name], female   DOB: 1955/11/11, 62 y.o.   MRN: [id number] BP (!) 115/59 (BP Location: Right Arm)   Pulse 78   Temp 98.5 F (36.9 C) (Oral)   Resp 18   Ht 4\' 10"  (1.473 m)   Wt 95.6 kg   SpO2 99%   BMI 44.06 kg/m  I was informed this morning approximately 0830 that [name] had not received the Metformin dose yesterday per my order. She informed me that the pharmacist put the order on hold. I was not notified that the medication was being held at that time. There were no medical contraindications to hold this medication, and I was given no opportunity to address the pharmacy concerns. This is problematic, and will need follow up. I will need to find out why this occurred. I instructed the nurse to administer the medication, and placed an order for the sliding scale.

## 2018-02-02 NOTE — Evaluation (Signed)
Occupational Therapy Evaluation Patient Details Name: [name] MRN: [id number] DOB: Jun 17, 1955 Today's Date: 02/02/2018    History of Present Illness Patient is a 62 y/o female who presents s/p L4-5, L5-S1 PLIF. PMH includes HTN, DM, anxiety, aortic stenosis.    Clinical Impression   PTA patient independent with ADLs, used rollator for community mobility, and did not complete IADLs.  She was admitted for above and limited by pain, decreased activity tolerance, impaired balance, back precautions and generalized weakness. She completes bed mobility with moderate assistance (to return to supine)/min guard to EOB, min assist for toilet transfers, min assist for toileting, mod assist for LB ADL, and supervision for UB ADL.  She was educated on precautions, safety, mobility, ADL compensatory techniques, DME/recommendations, and brace management and wear schedule.  Patient will benefit from continued OT services while admitted and at this time recommend [place] services to follow up (but may progress to no follow up needed) in order to optimize ADL and mobility to PLOF.  Plan for next session to focus on ADL AE for LB dressing and toileting, as well as tub transfers.     Follow Up Recommendations  Home health OT;Supervision/Assistance - 24 hour(may progress to no follow up )    Equipment Recommendations  3 in 1 bedside commode    Recommendations for Other Services       Precautions / Restrictions Precautions Precautions: Fall;Back Precaution Comments: Reviewed back precautions. Required Braces or Orthoses: Spinal Brace Spinal Brace: Lumbar corset;Applied in sitting position Restrictions Weight Bearing Restrictions: No      Mobility Bed Mobility Overal bed mobility: Needs Assistance Bed Mobility: Rolling;Sidelying to Sit;Sit to Sidelying Rolling: Min guard Sidelying to sit: Min guard;HOB elevated     Sit to sidelying: Mod assist;HOB elevated General bed mobility comments: min  cueing for log roll technique; min guard to transition to EOB but modA to transition to sidelying for B LE support  Transfers Overall transfer level: Needs assistance Equipment used: Rolling walker (2 wheeled) Transfers: Sit to/from Stand Sit to Stand: Min assist         General transfer comment: assist to power up into standing from EOB and toilet; requires cueing for hand placement and safety     Balance Overall balance assessment: Needs assistance Sitting-balance support: Feet supported;Bilateral upper extremity supported Sitting balance-[name]: Fair     Standing balance support: During functional activity;Bilateral upper extremity supported Standing balance-[name]: Fair Standing balance comment: reliant on at least 1 UE support, cueing to avoid forward lean to sink during grooming tasks                            ADL either performed or assessed with clinical judgement   ADL Overall ADL's : Needs assistance/impaired     Grooming: Minimal assistance;Standing Grooming Details (indicate cue type and reason): cueing for compensatory techniques and adherance to back precautions, limited by balance  Upper Body Bathing: Set up;Sitting   Lower Body Bathing: Moderate assistance;Sitting/lateral leans   Upper Body Dressing : Supervision/safety;Set up;Sitting   Lower Body Dressing: Moderate assistance;Sit to/from stand;Cueing for compensatory techniques;Cueing for safety Lower Body Dressing Details (indicate cue type and reason): reviewed compensatory techniques, unable to complete figure 4 technique and requires assistance for B feet  Toilet Transfer: Minimal assistance;Ambulation;Grab bars;Regular Materials engineer Details (indicate cue type and reason): increased time and effort, cueing for body mechanics and precautions  Toileting- Clothing Manipulation and Hygiene: Min  guard;Sit to/from stand;Cueing for compensatory techniques;Adhering to back  precautions Toileting - Clothing Manipulation Details (indicate cue type and reason): cueing for modified techniques for back precautions, increased time and effort      Functional mobility during ADLs: Minimal assistance;Rolling walker General ADL Comments: patient limited by pain, balance and precautions      Vision   Vision Assessment?: No apparent visual deficits     Perception     Praxis      Pertinent Vitals/Pain Pain Assessment: Faces Faces Pain Scale: Hurts even more Pain Location: back Pain Descriptors / Indicators: Operative site guarding;Sore;Discomfort;Grimacing Pain Intervention(s): Limited activity within patient's tolerance;Repositioned;Monitored during session     Hand Dominance Right   Extremity/Trunk Assessment Upper Extremity Assessment Upper Extremity Assessment: Overall WFL for tasks assessed   Lower Extremity Assessment Lower Extremity Assessment: Defer to PT evaluation   Cervical / Trunk Assessment Cervical / Trunk Assessment: Other exceptions Cervical / Trunk Exceptions: s/p spine surgery   Communication Communication Communication: No difficulties   Cognition Arousal/Alertness: Awake/alert Behavior During Therapy: WFL for tasks assessed/performed Overall Cognitive Status: Within Functional Limits for tasks assessed                                     General Comments  spouse present initally, but exiting halfway during session    Exercises     Shoulder Instructions      Home Living Family/patient expects to be discharged to:: Private residence Living Arrangements: Spouse/significant other Available Help at Discharge: Family;Available 24 hours/day Type of Home: House Home Access: Level entry     Home Layout: Two level Alternate Level Stairs-Number of Steps: 1 flight Alternate Level Stairs-Rails: Right Bathroom Shower/Tub: Teacher, early years/pre: Standard     Home Equipment: Walker - 4 wheels;Shower  seat          Prior Functioning/Environment Level of Independence: Needs assistance  Gait / Transfers Assistance Needed: reports using rollator for long distance mobility; no DME household ADL's / Homemaking Assistance Needed: independent ADLs, no IADLs            OT Problem List: Decreased activity tolerance;Impaired balance (sitting and/or standing);Decreased coordination;Decreased safety awareness;Decreased knowledge of use of DME or AE;Decreased knowledge of precautions;Pain      OT Treatment/Interventions: Self-care/ADL training;Therapeutic exercise;Energy conservation;DME and/or AE instruction;Therapeutic activities;Patient/family education;Balance training    OT Goals(Current goals can be found in the care plan section) Acute Rehab OT Goals Patient Stated Goal: to feel better OT Goal Formulation: With patient Time For Goal Achievement: 02/16/18 Potential to Achieve Goals: Good  OT Frequency: Min 2X/week   Barriers to D/C:            Co-evaluation              AM-PAC PT "6 Clicks" Daily Activity     Outcome Measure Help from another person eating meals?: None Help from another person taking care of personal grooming?: A Little Help from another person toileting, which includes using toliet, bedpan, or urinal?: A Little Help from another person bathing (including washing, rinsing, drying)?: A Lot Help from another person to put on and taking off regular upper body clothing?: None Help from another person to put on and taking off regular lower body clothing?: A Lot 6 Click Score: 18   End of Session Equipment Utilized During Treatment: Gait belt;Rolling walker;Back brace Nurse Communication: Mobility status  Activity Tolerance:  Patient tolerated treatment well Patient left: in bed;with call bell/phone within reach;with bed alarm set;with SCD's reapplied  OT Visit Diagnosis: Unsteadiness on feet (R26.81);Muscle weakness (generalized) (M62.81);Pain Pain -  part of body: (back)                Time: 6384-6659 OT Time Calculation (min): 22 min Charges:  OT General Charges $OT Visit: 1 Visit OT Evaluation $OT Eval Moderate Complexity: [place], OT Acute Rehabilitation Services Pager [phone] Office [phone]   Delight Stare 02/02/2018, 2:01 PM

## 2018-02-02 NOTE — Evaluation (Signed)
Physical Therapy Evaluation Patient Details Name: [name] MRN: [id number] DOB: 1956/04/23 Today's Date: 02/02/2018   History of Present Illness  Patient is a 62 y/o female who presents s/p L4-5, L5-S1 PLIF. PMH includes HTN, DM, anxiety, aortic stenosis.   Clinical Impression  Patient presents with pain and post surgical deficits s/p above. Pt Mod I PTA using rollator for ambulation. Tolerated bed mobility, transfers and gait training with Min A-Min guard for safety. Pt with dizziness initially sitting EOB and saturated bed pad from weeping bandage. RN notified. Education re: back precautions, positioning, brace etc. Pt has to negotiate 1 flight of steps to get to bedroom. Will follow acutely to maximize independence and mobility prior to return home. Will follow.   Follow Up Recommendations Home health PT;Supervision for mobility/OOB    Equipment Recommendations  Rolling walker with 5" wheels    Recommendations for Other Services       Precautions / Restrictions Precautions Precautions: Fall;Back Precaution Booklet Issued: No Precaution Comments: Reviewed back precautions. Required Braces or Orthoses: Spinal Brace Spinal Brace: Lumbar corset;Applied in sitting position Restrictions Weight Bearing Restrictions: No      Mobility  Bed Mobility Overal bed mobility: Needs Assistance Bed Mobility: Rolling;Sidelying to Sit Rolling: Min guard Sidelying to sit: Min guard;HOB elevated       General bed mobility comments: Cues for log roll technique, use of rail, increased time.  Transfers Overall transfer level: Needs assistance Equipment used: Rolling walker (2 wheeled) Transfers: Sit to/from Stand Sit to Stand: Min assist         General transfer comment: Assist to power to standing with cues for hand placement, stood from EOB x1, from toilet x1.   Ambulation/Gait Ambulation/Gait assistance: Min guard Gait Distance (Feet): 16 Feet(+22') Assistive device:  Rolling walker (2 wheeled) Gait Pattern/deviations: Step-to pattern;Step-through pattern;Decreased stride length Gait velocity: decreased   General Gait Details: Slow, unsteady gait with cues for RW proximity and management. Reports its difficulty to initiate stepping at times like her legs get stuck. Cues for upright  Stairs            Wheelchair Mobility    Modified Rankin (Stroke Patients Only)       Balance Overall balance assessment: Needs assistance Sitting-balance support: Feet supported;Bilateral upper extremity supported Sitting balance-[name]: Fair Sitting balance - Comments: Requires BUE support as position of comfort, Able to donn brace with setup/cues.   Standing balance support: During functional activity Standing balance-[name]: Fair Standing balance comment: Able to perform pericare with 1 UE support and Min A.                              Pertinent Vitals/Pain Pain Assessment: 0-10 Pain Score: 7  Pain Location: back Pain Descriptors / Indicators: Operative site guarding;Sore Pain Intervention(s): Monitored during session;Repositioned;Patient requesting pain meds-RN notified;Limited activity within patient's tolerance    Home Living Family/patient expects to be discharged to:: Private residence Living Arrangements: Spouse/significant other Available Help at Discharge: Family;Available PRN/intermittently Type of Home: House Home Access: Level entry     Home Layout: Two level Home Equipment: Walker - 4 wheels;Shower seat      Prior Function Level of Independence: Needs assistance   Gait / Transfers Assistance Needed: USed rollator for community ambulation. No DME for household ambulation.  ADL's / Homemaking Assistance Needed: Uses shower chair. Does own ADLs.        Hand Dominance  Extremity/Trunk Assessment   Upper Extremity Assessment Upper Extremity Assessment: Defer to OT evaluation    Lower Extremity  Assessment Lower Extremity Assessment: Generalized weakness(No numbness/tingling BLEs.)    Cervical / Trunk Assessment Cervical / Trunk Assessment: Other exceptions Cervical / Trunk Exceptions: s/p spine surgery  Communication   Communication: No difficulties  Cognition Arousal/Alertness: Awake/alert Behavior During Therapy: WFL for tasks assessed/performed Overall Cognitive Status: Within Functional Limits for tasks assessed                                        General Comments General comments (skin integrity, edema, etc.): Spouse present at end of session.    Exercises     Assessment/Plan    PT Assessment Patient needs continued PT services  PT Problem List Decreased strength;Decreased mobility;Decreased skin integrity;Cardiopulmonary status limiting activity;Decreased balance;Pain;Decreased knowledge of precautions       PT Treatment Interventions Functional mobility training;Balance training;Patient/family education;Gait training;Therapeutic activities;Stair training;Therapeutic exercise;DME instruction    PT Goals (Current goals can be found in the Care Plan section)  Acute Rehab PT Goals Patient Stated Goal: to feel better PT Goal Formulation: With patient Time For Goal Achievement: 02/16/18 Potential to Achieve Goals: Good    Frequency Min 5X/week   Barriers to discharge Inaccessible home environment flight of stairs to get to bedroom    Co-evaluation               AM-PAC PT "6 Clicks" Daily Activity  Outcome Measure Difficulty turning over in bed (including adjusting bedclothes, sheets and blankets)?: None Difficulty moving from lying on back to sitting on the side of the bed? : A Little Difficulty sitting down on and standing up from a chair with arms (e.g., wheelchair, bedside commode, etc,.)?: A Little Help needed moving to and from a bed to chair (including a wheelchair)?: A Little Help needed walking in hospital room?: A  Little Help needed climbing 3-5 steps with a railing? : A Lot 6 Click Score: 18    End of Session Equipment Utilized During Treatment: Back brace;Gait belt Activity Tolerance: Patient tolerated treatment well Patient left: in chair;with call bell/phone within reach;with nursing/sitter in room;with family/visitor present Nurse Communication: Mobility status PT Visit Diagnosis: Pain;Difficulty in walking, not elsewhere classified (R26.2);Muscle weakness (generalized) (M62.81) Pain - part of body: (back)    Time: 0300-9233 PT Time Calculation (min) (ACUTE ONLY): 30 min   Charges:   PT Evaluation $PT Eval Low Complexity: 1 Low PT Treatments $Therapeutic Activity: 8-22 mins        Wray Kearns, PT, DPT Acute Rehabilitation Services Pager [phone] Office [phone]      Marguarite Arbour A Sabra Heck 02/02/2018, 9:16 AM

## 2018-02-02 NOTE — Anesthesia Postprocedure Evaluation (Signed)
Anesthesia Post Note  Patient: [name]  Procedure(s) Performed: Lumbar Four-Five Lumbar Five-Sacral One Posterior lumbar interbody fusion (N/A Back)     Patient location during evaluation: PACU Anesthesia Type: General Level of consciousness: awake and alert Pain management: pain level controlled Vital Signs Assessment: post-procedure vital signs reviewed and stable Respiratory status: spontaneous breathing, nonlabored ventilation, respiratory function stable and patient connected to nasal cannula oxygen Cardiovascular status: blood pressure returned to baseline and stable Postop Assessment: no apparent nausea or vomiting Anesthetic complications: no    Last Vitals:  Vitals:   02/02/18 1230 02/02/18 2033  BP: (!) 142/88 (!) 108/54  Pulse:  85  Resp:  18  Temp:  37.1 C  SpO2:  97%    Last Pain:  Vitals:   02/02/18 2033  TempSrc: Oral  PainSc:                  Terricka Onofrio L Akul Leggette

## 2018-02-02 NOTE — Progress Notes (Signed)
Patient ID: [name], female   DOB: 01-22-1956, 62 y.o.   MRN: [id number] BP (!) 108/54 (BP Location: Left Arm)   Pulse 85   Temp 98.7 F (37.1 C) (Oral)   Resp 18   Ht 4\' 10"  (1.473 m)   Wt 95.6 kg   SpO2 97%   BMI 44.06 kg/m  Alert and oriented x 4, speech is clear and fluent Moving all extremities well Wound is clean, dry Working with Pt, and OT Doing well overall

## 2018-02-02 NOTE — Progress Notes (Addendum)
Called MD for diet orders, poct cbg testing, and question regarding metformin.   Will continue to monitor

## 2018-02-03 ENCOUNTER — Other Ambulatory Visit: Payer: Self-pay

## 2018-02-03 LAB — GLUCOSE, CAPILLARY
Glucose-Capillary: 113 mg/dL — ABNORMAL HIGH (ref 70–99)
Glucose-Capillary: 162 mg/dL — ABNORMAL HIGH (ref 70–99)
Glucose-Capillary: 115 mg/dL — ABNORMAL HIGH (ref 70–99)
Glucose-Capillary: 143 mg/dL — ABNORMAL HIGH (ref 70–99)

## 2018-02-03 MED ORDER — INSULIN ASPART 100 UNIT/ML ~~LOC~~ SOLN
0.0000 [IU] | Freq: Three times a day (TID) | SUBCUTANEOUS | Status: DC
  Administered 2018-02-03 – 2018-02-04 (×2): 3 [IU] via SUBCUTANEOUS
  Administered 2018-02-04 – 2018-02-05 (×2): 2 [IU] via SUBCUTANEOUS

## 2018-02-03 MED FILL — Heparin Sodium (Porcine) Inj 1000 Unit/ML: INTRAMUSCULAR | Qty: 30 | Status: AC

## 2018-02-03 MED FILL — Sodium Chloride IV Soln 0.9%: INTRAVENOUS | Qty: 1000 | Status: AC

## 2018-02-03 NOTE — Care Management Note (Signed)
Case Management Note  Patient Details  Name: [name] MRN: [id number] Date of Birth: 10-07-1955  Subjective/Objective:       Pt s/p lumbar surgery. She is from home with her spouse.              Action/Plan: Pt with orders for 3 in 1 and walker. CM notified Jeneen Rinks with [facility] DME and he delivered the equipment to the room. Pt has recommendations for [facility] services. CM met with her and provided her choice. She selected [facility]. Pt will need F2F and [place] orders.  Family able to provide supervision at home and transportation to home.   Expected Discharge Date:  02/04/18               Expected Discharge Plan:  [place]  In-House Referral:     Discharge planning Services  CM Consult  Post Acute Care Choice:  Home Health, Durable Medical Equipment Choice offered to:  Patient  DME Arranged:  3-N-1, Walker rolling DME Agency:  [place]:    [facility] Agency:  [facility]  Status of Service:  In process, will continue to follow  If discussed at Long Length of Stay Meetings, dates discussed:    Additional Comments:  Pollie Friar, RN 02/03/2018, 4:36 PM

## 2018-02-03 NOTE — Progress Notes (Signed)
Occupational Therapy Treatment Patient Details Name: [name] MRN: [id number] DOB: Sep 17, 1955 Today's Date: 02/03/2018    History of present illness Patient is a 62 y/o female who presents s/p L4-5, L5-S1 PLIF. PMH includes HTN, DM, anxiety, aortic stenosis.    OT comments  Patient progressing slowly.  Continues to require mod assist with bed mobility while returning to supine and spouse reports concern with height of bed at home (reviewed compensatory techniques and possible use of step stool), complete toilet transfers with min assist, toileting with min assist and LB dressing with min assist using AE.  Reviewed and return demonstrated use of reacher and sock aide with supervision, demonstrated use of toileting aide which patient is agreeable that she will benefit from.  Unable to successfully complete tub transfer at this time. Continue to recommend 24/7 assistance at discharge, follow up with [place].  Will continue to follow. Plan for next session focus on bed mobility and tub transfers.      Follow Up Recommendations  Home health OT;Supervision/Assistance - 24 hour    Equipment Recommendations  3 in 1 bedside commode    Recommendations for Other Services      Precautions / Restrictions Precautions Precautions: Fall;Back Precaution Booklet Issued: No Precaution Comments: Reviewed back precautions. Required Braces or Orthoses: Spinal Brace Spinal Brace: Lumbar corset;Applied in sitting position Restrictions Weight Bearing Restrictions: No       Mobility Bed Mobility Overal bed mobility: Needs Assistance Bed Mobility: Rolling;Sidelying to Sit;Sit to Sidelying Rolling: Supervision Sidelying to sit: Supervision;HOB elevated     Sit to sidelying: Mod assist;HOB elevated General bed mobility comments: demonstrates good recall of technique, supervision to transition to EOB with increased time and effort; requires assist for B LEs when returning to sidelying; spouse  concerned with height of bed at home (may benefit from practicing step stool)  Transfers Overall transfer level: Needs assistance Equipment used: Rolling walker (2 wheeled) Transfers: Sit to/from Stand Sit to Stand: Min guard         General transfer comment: Min guard for safety. stood from EOB x1, from toilet x1 using grab bar. Cues for hand placement.     Balance Overall balance assessment: Needs assistance Sitting-balance support: Feet supported;No upper extremity supported Sitting balance-[name]: Fair Sitting balance - Comments: Requires UE support as position of comfort, Able to donn brace with setup/cues.   Standing balance support: During functional activity;Single extremity supported Standing balance-[name]: Fair Standing balance comment: reliant on at least 1 UE support, cueing to avoid forward lean to sink during grooming tasks.                           ADL either performed or assessed with clinical judgement   ADL Overall ADL's : Needs assistance/impaired     Grooming: Min guard;Standing;Wash/dry hands Grooming Details (indicate cue type and reason): cueing for posture and compensatory techniques, reliant on 1 hand support while washing hands     Lower Body Bathing: Minimal assistance;Sitting/lateral leans;Cueing for compensatory techniques;With adaptive equipment Lower Body Bathing Details (indicate cue type and reason): reviewed safety and compensatory techniques, min assist using long sponge      Lower Body Dressing: Minimal assistance;With adaptive equipment;Sit to/from stand;Cueing for back precautions;Cueing for compensatory techniques Lower Body Dressing Details (indicate cue type and reason): educated on AE for LB dressing (reacher/sock aide) with good return demonstration; min assist in standing for balance; plans to have spouse assist Toilet Transfer: Minimal  assistance;Ambulation;Grab bars;Regular Materials engineer Details  (indicate cue type and reason): min assist for safety, cueing for posture and technique Toileting- Clothing Manipulation and Hygiene: Minimal assistance;Sit to/from stand;Cueing for compensatory techniques;Cueing for safety Toileting - Clothing Manipulation Details (indicate cue type and reason): min assist for thoroughness of hygiene, cueing for technique and body mechanics, reviewed AE with use of tongs for toileting    Tub/Shower Transfer Details (indicate cue type and reason): attempted tub transfer reverse step using RW, unable to successfully lift R LE over tub at this time  Functional mobility during ADLs: Min guard;Rolling walker(cueing for posture and walker management ) General ADL Comments: patient limited by pain, balance and precautions      Vision       Perception     Praxis      Cognition Arousal/Alertness: Lethargic Behavior During Therapy: WFL for tasks assessed/performed Overall Cognitive Status: Within Functional Limits for tasks assessed                                          Exercises     Shoulder Instructions       General Comments spouse present and supportive    Pertinent Vitals/ Pain       Pain Assessment: Faces Faces Pain Scale: Hurts little more Pain Location: back Pain Descriptors / Indicators: Aching;Discomfort;Grimacing;Guarding Pain Intervention(s): Monitored during session;Repositioned;Patient requesting pain meds-RN notified  Home Living                                          Prior Functioning/Environment              Frequency  Min 2X/week        Progress Toward Goals  OT Goals(current goals can now be found in the care plan section)  Progress towards OT goals: Progressing toward goals  Acute Rehab OT Goals Patient Stated Goal: to feel better OT Goal Formulation: With patient Time For Goal Achievement: 02/16/18 Potential to Achieve Goals: Good  Plan Discharge plan remains  appropriate;Frequency remains appropriate    Co-evaluation                 AM-PAC PT "6 Clicks" Daily Activity     Outcome Measure   Help from another person eating meals?: None Help from another person taking care of personal grooming?: A Little Help from another person toileting, which includes using toliet, bedpan, or urinal?: A Little Help from another person bathing (including washing, rinsing, drying)?: A Lot Help from another person to put on and taking off regular upper body clothing?: None Help from another person to put on and taking off regular lower body clothing?: A Lot 6 Click Score: 18    End of Session Equipment Utilized During Treatment: Gait belt;Back brace;Rolling walker  OT Visit Diagnosis: Unsteadiness on feet (R26.81);Muscle weakness (generalized) (M62.81);Pain Pain - part of body: (back)   Activity Tolerance Patient tolerated treatment well   Patient Left in bed;with call bell/phone within reach;with family/visitor present   Nurse Communication Mobility status;Patient requests pain meds        Time: 8588-5027 OT Time Calculation (min): 40 min  Charges: OT General Charges $OT Visit: 1 Visit OT Treatments $Self Care/Home Management : 38-52 mins  Delight Stare, OT Acute Rehabilitation Services Pager  [phone] Office [phone]    Delight Stare 02/03/2018, 11:18 AM

## 2018-02-03 NOTE — Care Management Important Message (Signed)
Important Message  Patient Details  Name: [name] MRN: [id number] Date of Birth: Apr 15, 1956   Medicare Important Message Given:  Yes    [name] [name] 02/03/2018, 3:36 PM

## 2018-02-03 NOTE — Progress Notes (Signed)
Physical Therapy Treatment Patient Details Name: [name] MRN: [id number] DOB: 1955-09-12 Today's Date: 02/03/2018    History of Present Illness Patient is a 62 y/o female who presents s/p L4-5, L5-S1 PLIF. PMH includes HTN, DM, anxiety, aortic stenosis.     PT Comments    Patient progressing well towards PT goals. Reports left shoulder/neck pain most likely due to poor positioning sleeping last night which improved with movement. Pt sleepy from pain meds but improved ambulation distance. Continues to require cues to adhere to back precautions during mobility. Able to recall 2/3 back precautions. Having difficulty performing pericare. Will plan for stair training next session as tolerated. Will follow.    Follow Up Recommendations  Home health PT;Supervision for mobility/OOB     Equipment Recommendations  Rolling walker with 5" wheels    Recommendations for Other Services       Precautions / Restrictions Precautions Precautions: Fall;Back Precaution Booklet Issued: No Precaution Comments: Reviewed back precautions. Required Braces or Orthoses: Spinal Brace Spinal Brace: Lumbar corset;Applied in sitting position Restrictions Weight Bearing Restrictions: No    Mobility  Bed Mobility Overal bed mobility: Needs Assistance Bed Mobility: Rolling;Sidelying to Sit Rolling: Min assist Sidelying to sit: Min guard;HOB elevated       General bed mobility comments: Assist to roll onto left side due to pain in left shoulder/neck (positionally slept wrong on it, improved once up), able to push up to sitting with increased time.   Transfers Overall transfer level: Needs assistance Equipment used: Rolling walker (2 wheeled) Transfers: Sit to/from Stand Sit to Stand: Min guard         General transfer comment: Min guard for safety. stood from EOB x1, from toilet x1 using grab bar. Cues for hand placement.   Ambulation/Gait Ambulation/Gait assistance: Min guard Gait  Distance (Feet): 120 Feet Assistive device: Rolling walker (2 wheeled) Gait Pattern/deviations: Step-through pattern;Decreased stride length;Trunk flexed Gait velocity: decreased   General Gait Details: Slow, mildly unsteady gait with cues for RW proximity and upright posture. Sleepy due to pain meds so cues to keep eyes opened.   Stairs             Wheelchair Mobility    Modified Rankin (Stroke Patients Only)       Balance Overall balance assessment: Needs assistance Sitting-balance support: Feet supported;Single extremity supported Sitting balance-[name]: Fair Sitting balance - Comments: Requires UE support as position of comfort, Able to donn brace with setup/cues.   Standing balance support: During functional activity Standing balance-[name]: Fair Standing balance comment: reliant on at least 1 UE support, cueing to avoid forward lean to sink during grooming tasks.                            Cognition Arousal/Alertness: Lethargic Behavior During Therapy: WFL for tasks assessed/performed Overall Cognitive Status: Within Functional Limits for tasks assessed                                        Exercises      General Comments        Pertinent Vitals/Pain Pain Assessment: Faces Faces Pain Scale: Hurts even more Pain Location: left shoulder/neck Pain Descriptors / Indicators: Aching;Discomfort;Grimacing;Guarding Pain Intervention(s): Monitored during session;Premedicated before session    Home Living  Prior Function            PT Goals (current goals can now be found in the care plan section) Progress towards PT goals: Progressing toward goals    Frequency    Min 5X/week      PT Plan Current plan remains appropriate    Co-evaluation              AM-PAC PT "6 Clicks" Daily Activity  Outcome Measure  Difficulty turning over in bed (including adjusting bedclothes,  sheets and blankets)?: Unable Difficulty moving from lying on back to sitting on the side of the bed? : A Little Difficulty sitting down on and standing up from a chair with arms (e.g., wheelchair, bedside commode, etc,.)?: A Little Help needed moving to and from a bed to chair (including a wheelchair)?: A Little Help needed walking in hospital room?: A Little Help needed climbing 3-5 steps with a railing? : A Lot 6 Click Score: 15    End of Session Equipment Utilized During Treatment: Back brace;Gait belt Activity Tolerance: Patient tolerated treatment well;Patient limited by lethargy Patient left: in bed;with call bell/phone within reach Nurse Communication: Mobility status PT Visit Diagnosis: Pain;Difficulty in walking, not elsewhere classified (R26.2);Muscle weakness (generalized) (M62.81) Pain - Right/Left: Left Pain - part of body: Shoulder     Time: 8341-9622 PT Time Calculation (min) (ACUTE ONLY): 20 min  Charges:  $Therapeutic Activity: 8-22 mins                     Wray Kearns, PT, DPT Acute Rehabilitation Services Pager [phone] Office [phone]       Marguarite Arbour A Sabra Heck 02/03/2018, 9:20 AM

## 2018-02-03 NOTE — Progress Notes (Deleted)
Pt found attempting to climb out of bed, legs out of bed, bed alarm on, IV pulled out, tele off, pulse ox pulled off, condom cath pulled off. Pt able to tell RN name and DOB. Pt unsure of his location. Pt reoriented to location and situation. Pt bed changed, condom cath replaced, tele replaced. IV team consult placed, MD paged. CN aware.

## 2018-02-03 NOTE — Progress Notes (Signed)
Patient ID: [name], female   DOB: 09-23-1955, 62 y.o.   MRN: [id number] BP (!) 105/52 (BP Location: Right Arm)   Pulse 100   Temp 98.7 F (37.1 C) (Oral)   Resp 16   Ht 4\' 10"  (1.473 m)   Wt 95.6 kg   SpO2 97%   BMI 44.06 kg/m  Alert and oriented x 4, speech is clear and fluent Moving all extremities well Continue with therapy Possible discharge this weekend

## 2018-02-04 LAB — GLUCOSE, CAPILLARY
Glucose-Capillary: 162 mg/dL — ABNORMAL HIGH (ref 70–99)
Glucose-Capillary: 142 mg/dL — ABNORMAL HIGH (ref 70–99)
Glucose-Capillary: 113 mg/dL — ABNORMAL HIGH (ref 70–99)
Glucose-Capillary: 111 mg/dL — ABNORMAL HIGH (ref 70–99)

## 2018-02-04 NOTE — Progress Notes (Signed)
Occupational Therapy Treatment Patient Details Name: [name] MRN: [id number] DOB: Dec 18, 1955 Today's Date: 02/04/2018    History of present illness Patient is a 62 y/o female who presents s/p L4-5, L5-S1 PLIF. PMH includes HTN, DM, anxiety, aortic stenosis.    OT comments  Pt progressing towards acute OT goals. Focus of session was bed mobility, tub and toilet transfers. Pt reporting some dizziness once sitting upright which seemed to linger with OOB activities. Pleasant and conversational but noted to have eyes often closed. D/c plan remains appropriate.    Follow Up Recommendations  Home health OT;Supervision/Assistance - 24 hour    Equipment Recommendations  3 in 1 bedside commode    Recommendations for Other Services      Precautions / Restrictions Precautions Precautions: Fall;Back Precaution Comments: Reviewed back precautions. Required Braces or Orthoses: Spinal Brace Spinal Brace: Lumbar corset;Applied in sitting position Restrictions Weight Bearing Restrictions: No       Mobility Bed Mobility Overal bed mobility: Needs Assistance Bed Mobility: Rolling;Sidelying to Sit Rolling: Supervision Sidelying to sit: Supervision;HOB elevated       General bed mobility comments: HOB partially elevated. good technique. + use of bed rails  Transfers Overall transfer level: Needs assistance Equipment used: Rolling walker (2 wheeled) Transfers: Sit to/from Stand Sit to Stand: Min guard;Min assist         General transfer comment: min A from lower seat surfaces and intial stand.    Balance Overall balance assessment: Needs assistance Sitting-balance support: Feet supported;No upper extremity supported Sitting balance-[name]: Fair Sitting balance - Comments: Requires UE support as position of comfort, Able to donn brace with setup/cues.   Standing balance support: During functional activity;Single extremity supported Standing balance-[name]:  Fair                             ADL either performed or assessed with clinical judgement   ADL Overall ADL's : Needs assistance/impaired     Grooming: Min guard;Standing;Wash/dry Geophysical data processor Transfer: Minimal assistance;Ambulation;Grab bars;Regular Materials engineer Details (indicate cue type and reason): min A to steady, trialed 3n1 and pt needing less assist. placed 3n1 over toilet Toileting- Clothing Manipulation and Hygiene: Minimal assistance;Sit to/from stand;Cueing for compensatory techniques;Cueing for safety Toileting - Clothing Manipulation Details (indicate cue type and reason): reviewed technique with tongs Tub/ Shower Transfer: Minimal assistance;Tub transfer;Ambulation;Shower Technical sales engineer Details (indicate cue type and reason): practiced in simulated setup. cues for not twisting in transition from rw to wall for external support Functional mobility during ADLs: Min guard;Rolling walker General ADL Comments: Pt completed bed mobility, toilet transfer, pericare, simulated tub transfer, and grooming task at sink     Vision       Perception     Praxis      Cognition Arousal/Alertness: Awake/alert;Lethargic(conversational, eyes often closed) Behavior During Therapy: WFL for tasks assessed/performed Overall Cognitive Status: Within Functional Limits for tasks assessed                                          Exercises     Shoulder Instructions       General Comments      Pertinent Vitals/ Pain       Pain Assessment: Faces  Faces Pain Scale: Hurts little more Pain Location: back Pain Descriptors / Indicators: Aching;Discomfort;Grimacing;Guarding Pain Intervention(s): Monitored during session;Limited activity within patient's tolerance;Repositioned;RN gave pain meds during session  Home Living                                          Prior  Functioning/Environment              Frequency  Min 2X/week        Progress Toward Goals  OT Goals(current goals can now be found in the care plan section)  Progress towards OT goals: Progressing toward goals  Acute Rehab OT Goals Patient Stated Goal: to feel better OT Goal Formulation: With patient Time For Goal Achievement: 02/16/18 Potential to Achieve Goals: Good ADL Goals Pt Will Perform Grooming: with modified independence;standing Pt Will Perform Lower Body Bathing: with supervision;sitting/lateral leans;with adaptive equipment Pt Will Perform Lower Body Dressing: with supervision;sit to/from stand;with adaptive equipment Pt Will Transfer to Toilet: with supervision;ambulating;bedside commode Pt Will Perform Toileting - Clothing Manipulation and hygiene: with supervision;sit to/from stand;with adaptive equipment Pt Will Perform Tub/Shower Transfer: Tub transfer;shower seat;ambulating;rolling walker  Plan Discharge plan remains appropriate;Frequency remains appropriate    Co-evaluation                 AM-PAC PT "6 Clicks" Daily Activity     Outcome Measure   Help from another person eating meals?: None Help from another person taking care of personal grooming?: A Little Help from another person toileting, which includes using toliet, bedpan, or urinal?: A Little Help from another person bathing (including washing, rinsing, drying)?: A Lot Help from another person to put on and taking off regular upper body clothing?: None Help from another person to put on and taking off regular lower body clothing?: A Lot 6 Click Score: 18    End of Session Equipment Utilized During Treatment: Gait belt;Back brace;Rolling walker  OT Visit Diagnosis: Unsteadiness on feet (R26.81);Muscle weakness (generalized) (M62.81);Pain   Activity Tolerance Patient tolerated treatment well   Patient Left in chair;with call bell/phone within reach;with chair alarm set;with  nursing/sitter in room   Nurse Communication          Time: 5681-2751 OT Time Calculation (min): 24 min  Charges: OT General Charges $OT Visit: 1 Visit OT Treatments $Self Care/Home Management : 23-37 mins  Tyrone Schimke, OT Acute Rehabilitation Services Pager: [phone] Office: [phone]   [name] 02/04/2018, 9:30 AM

## 2018-02-04 NOTE — Progress Notes (Signed)
Patient ID: [name], female   DOB: 1955-11-06, 62 y.o.   MRN: [id number] BP 107/72 (BP Location: Left Arm)   Pulse 99   Temp 98.2 F (36.8 C) (Oral)   Resp 18   Ht 4\' 10"  (1.473 m)   Wt 95.6 kg   SpO2 98%   BMI 44.06 kg/m  Alert and oriented x 4 Moving all extremities well Wound is clean, dry, no signs of infection.

## 2018-02-04 NOTE — Progress Notes (Signed)
Physical Therapy Treatment Patient Details Name: [name] MRN: [id number] DOB: April 19, 1956 Today's Date: 02/04/2018    History of Present Illness Patient is a 62 y/o female who presents s/p L4-5, L5-S1 PLIF. PMH includes HTN, DM, anxiety, aortic stenosis.     PT Comments    Pt making steady progress with functional mobility. She participated in stair training this session with no difficulties. Pt would continue to benefit from skilled physical therapy services at this time while admitted and after d/c to address the below listed limitations in order to improve overall safety and independence with functional mobility.    Follow Up Recommendations  Home health PT;Supervision for mobility/OOB     Equipment Recommendations  Rolling walker with 5" wheels    Recommendations for Other Services       Precautions / Restrictions Precautions Precautions: Fall;Back Precaution Comments: pt able to recall 2/3 back precautions Required Braces or Orthoses: Spinal Brace Spinal Brace: Lumbar corset;Applied in sitting position Restrictions Weight Bearing Restrictions: No    Mobility  Bed Mobility Overal bed mobility: Needs Assistance Bed Mobility: Rolling;Sit to Sidelying Rolling: Supervision Sidelying to sit: Supervision;HOB elevated     Sit to sidelying: Mod assist;HOB elevated General bed mobility comments: increased time and effort, good techinque, assist with return of bilateral LEs onto bed  Transfers Overall transfer level: Needs assistance Equipment used: Rolling walker (2 wheeled) Transfers: Sit to/from Stand Sit to Stand: Min guard         General transfer comment: min guard for safety, good technique utilized  Ambulation/Gait Ambulation/Gait assistance: Counsellor (Feet): 150 Feet Assistive device: Rolling walker (2 wheeled) Gait Pattern/deviations: Step-through pattern;Decreased stride length;Trunk flexed Gait velocity: decreased Gait velocity  interpretation: <1.31 ft/sec, indicative of household ambulator General Gait Details: slow, steady gait with RW, min guard for safety   Stairs Stairs: Yes Stairs assistance: Min guard Stair Management: Two rails;One rail Left;Step to pattern;Forwards;Sideways Number of Stairs: 2(x2 trials) General stair comments: first trial pt performed with bilateral hand rails, second trial with single hand rail laterally; min guard for both   Wheelchair Mobility    Modified Rankin (Stroke Patients Only)       Balance Overall balance assessment: Needs assistance Sitting-balance support: Feet supported;No upper extremity supported Sitting balance-[name]: Good Sitting balance - Comments: Requires UE support as position of comfort, Able to donn brace with setup/cues.   Standing balance support: During functional activity;Bilateral upper extremity supported;Single extremity supported Standing balance-[name]: Poor                              Cognition Arousal/Alertness: Awake/alert Behavior During Therapy: WFL for tasks assessed/performed Overall Cognitive Status: Within Functional Limits for tasks assessed                                        Exercises      General Comments        Pertinent Vitals/Pain Pain Assessment: Faces Faces Pain Scale: Hurts little more Pain Location: back Pain Descriptors / Indicators: Aching;Discomfort;Grimacing;Guarding Pain Intervention(s): Monitored during session;Repositioned    Home Living                      Prior Function            PT Goals (current goals can now be found  in the care plan section) Acute Rehab PT Goals Patient Stated Goal: to feel better PT Goal Formulation: With patient Time For Goal Achievement: 02/16/18 Potential to Achieve Goals: Good Progress towards PT goals: Progressing toward goals    Frequency    Min 5X/week      PT Plan Current plan remains appropriate     Co-evaluation              AM-PAC PT "6 Clicks" Daily Activity  Outcome Measure  Difficulty turning over in bed (including adjusting bedclothes, sheets and blankets)?: A Little Difficulty moving from lying on back to sitting on the side of the bed? : Unable Difficulty sitting down on and standing up from a chair with arms (e.g., wheelchair, bedside commode, etc,.)?: Unable Help needed moving to and from a bed to chair (including a wheelchair)?: A Little Help needed walking in hospital room?: A Little Help needed climbing 3-5 steps with a railing? : A Little 6 Click Score: 14    End of Session Equipment Utilized During Treatment: Back brace;Gait belt Activity Tolerance: Patient tolerated treatment well Patient left: in bed;with call bell/phone within reach Nurse Communication: Mobility status PT Visit Diagnosis: Pain;Difficulty in walking, not elsewhere classified (R26.2);Muscle weakness (generalized) (M62.81) Pain - part of body: (back)     Time: 1594-7076 PT Time Calculation (min) (ACUTE ONLY): 17 min  Charges:  $Gait Training: 8-22 mins                     Sherie Don, [place], DPT  Acute Rehabilitation Services Pager [phone] Office [place] 02/04/2018, 10:39 AM

## 2018-02-05 LAB — GLUCOSE, CAPILLARY
Glucose-Capillary: 121 mg/dL — ABNORMAL HIGH (ref 70–99)
Glucose-Capillary: 130 mg/dL — ABNORMAL HIGH (ref 70–99)

## 2018-02-05 MED ORDER — OXYCODONE HCL 5 MG PO TABS: 5.0000 mg | ORAL_TABLET | ORAL | 0 refills | Status: DC | PRN

## 2018-02-05 MED ORDER — DIAZEPAM 5 MG PO TABS: 5.0000 mg | ORAL_TABLET | Freq: Four times a day (QID) | ORAL | 0 refills | Status: DC | PRN

## 2018-02-05 MED ORDER — DOCUSATE SODIUM 100 MG PO CAPS: 100.0000 mg | ORAL_CAPSULE | Freq: Two times a day (BID) | ORAL | 0 refills | Status: AC | PRN

## 2018-02-05 NOTE — Discharge Instructions (Signed)
Discharge Instructions  No restriction in activities, slowly increase your activity back to normal.   Okay to shower on the day of discharge. Be gentle when cleaning your incision. Use regular soap and water. If that is uncomfortable, try using baby shampoo. Do not submerge the wound under water for 2 weeks after surgery.  If you do not already have a post-operative visit scheduled with Dr. Christella Noa, please call his office at [phone] and schedule a follow up visit. Please call the office number with any concerns or questions.

## 2018-02-05 NOTE — Care Management Note (Signed)
Case Management Note  Patient Details  Name: JETAUN COLBATH MRN: [id number] Date of Birth: 13-Jun-1955  Subjective/Objective:                    Action/Plan: Pt discharging home with orders for [facility] services. CM notified Dorian Pod with [facility] about d/c. Pt has transportation home.    Expected Discharge Date:  02/05/18               Expected Discharge Plan:  [place]  In-House Referral:     Discharge planning Services  CM Consult  Post Acute Care Choice:  Home Health, Durable Medical Equipment Choice offered to:  Patient  DME Arranged:  3-N-1, Walker rolling DME Agency:  [place]:  PT, OT, RN [facility] Agency:  [facility]  Status of Service:  Completed, signed off  If discussed at [place] of Stay Meetings, dates discussed:    Additional Comments:  Pollie Friar, RN 02/05/2018, 3:57 PM

## 2018-02-05 NOTE — Progress Notes (Signed)
Neurosurgery Service Progress Note  Subjective: No acute events overnight, ambulating well with PT, minimal back pain, some right anterolateral leg discomfort  Objective: Vitals:   02/04/18 2326 02/05/18 0423 02/05/18 0736 02/05/18 1135  BP: 120/74 (!) 95/53 115/65 (!) 109/55  Pulse: 97 82 81 78  Resp: 17 18 18 18   Temp: 99.2 F (37.3 C) 99.2 F (37.3 C) 98.8 F (37.1 C) 98.5 F (36.9 C)  TempSrc: Oral Oral Oral Oral  SpO2: 96% 97% 98% 98%  Weight:      Height:       Temp (24hrs), Avg:98.9 F (37.2 C), Min:98.4 F (36.9 C), Max:99.4 F (37.4 C)  CBC Latest Ref Rng & Units 02/01/2018 01/25/2018 10/06/2017  WBC 4.0 - 10.5 K/uL 10.1 7.6 5.7  Hemoglobin 12.0 - 15.0 g/dL 10.2(L) 11.5(L) 12.1  Hematocrit 36.0 - 46.0 % 33.4(L) 39.2 39.8  Platelets 150 - 400 K/uL 324 389 405(H)   BMP Latest Ref Rng & Units 02/01/2018 01/25/2018 08/19/2017  Glucose 70 - 99 mg/dL - 129(H) 151(H)  BUN 6 - 20 mg/dL - 23(H) 17  Creatinine 0.44 - 1.00 mg/dL 1.05(H) 1.08(H) 1.01(H)  BUN/Creat Ratio 12 - 28 - - 17  Sodium 135 - 145 mmol/L - 142 144  Potassium 3.5 - 5.1 mmol/L - 4.0 3.9  Chloride 98 - 111 mmol/L - 105 102  CO2 22 - 32 mmol/L - 27 25  Calcium 8.9 - 10.3 mg/dL - 10.2 10.4(H)    Intake/Output Summary (Last 24 hours) at 02/05/2018 1443 Last data filed at 02/05/2018 0815 Gross per 24 hour  Intake 630 ml  Output -  Net 630 ml    Current Facility-Administered Medications:  .  0.9 %  sodium chloride infusion, 250 mL, Intravenous, Continuous, Cabbell, Kyle, MD .  0.9 % NaCl with KCl 20 mEq/ L  infusion, , Intravenous, Continuous, Ashok Pall, MD, Stopped at 02/03/18 1900 .  acetaminophen (TYLENOL) tablet 650 mg, 650 mg, Oral, Q4H PRN, 650 mg at 02/03/18 2204 **OR** acetaminophen (TYLENOL) suppository 650 mg, 650 mg, Rectal, Q4H PRN, Ashok Pall, MD .  albuterol (PROVENTIL) (2.5 MG/3ML) 0.083% nebulizer solution 2.5 mg, 2.5 mg, Nebulization, Q6H PRN, Love, Anna K, RPH .  amLODipine (NORVASC)  tablet 10 mg, 10 mg, Oral, Daily, Ashok Pall, MD, 10 mg at 02/04/18 1725 .  bisacodyl (DULCOLAX) EC tablet 5 mg, 5 mg, Oral, Daily PRN, Ashok Pall, MD .  celecoxib (CELEBREX) capsule 200 mg, 200 mg, Oral, BID, Ashok Pall, MD, 200 mg at 02/05/18 0839 .  clobetasol ointment (TEMOVATE) 4.68 % 1 application, 1 application, Topical, QODAY, Ashok Pall, MD, 1 application at 08/11/20 0840 .  diazepam (VALIUM) tablet 5 mg, 5 mg, Oral, Q6H PRN, Ashok Pall, MD .  fenofibrate tablet 54 mg, 54 mg, Oral, Daily, Ashok Pall, MD, 54 mg at 02/05/18 4825 .  gabapentin (NEURONTIN) capsule 300 mg, 300 mg, Oral, TID, Ashok Pall, MD, 300 mg at 02/05/18 0837 .  heparin injection 5,000 Units, 5,000 Units, Subcutaneous, Q8H, Ashok Pall, MD, 5,000 Units at 02/05/18 0840 .  losartan (COZAAR) tablet 100 mg, 100 mg, Oral, Daily, 100 mg at 02/05/18 0838 **AND** hydrochlorothiazide (HYDRODIURIL) tablet 25 mg, 25 mg, Oral, Daily, Love, Sheela Stack, RPH, 25 mg at 02/05/18 0837 .  insulin aspart (novoLOG) injection 0-15 Units, 0-15 Units, Subcutaneous, TID WC, Ashok Pall, MD, 2 Units at 02/05/18 1243 .  iron polysaccharides (NIFEREX) capsule 150 mg, 150 mg, Oral, Daily, Ashok Pall, MD, 150 mg at 02/05/18 0837 .  loratadine (CLARITIN) tablet 10 mg, 10 mg, Oral, Daily, Ashok Pall, MD, 10 mg at 02/05/18 3254 .  magnesium citrate solution 1 Bottle, 1 Bottle, Oral, Once PRN, Ashok Pall, MD .  menthol-cetylpyridinium (CEPACOL) lozenge 3 mg, 1 lozenge, Oral, PRN **OR** phenol (CHLORASEPTIC) mouth spray 1 spray, 1 spray, Mouth/Throat, PRN, Ashok Pall, MD .  metFORMIN (GLUCOPHAGE) tablet 1,000 mg, 1,000 mg, Oral, BID WC, Ashok Pall, MD, 1,000 mg at 02/05/18 0839 .  metoprolol succinate (TOPROL-XL) 24 hr tablet 100 mg, 100 mg, Oral, Daily, Ashok Pall, MD, 100 mg at 02/04/18 1725 .  morphine 2 MG/ML injection 2 mg, 2 mg, Intravenous, Q2H PRN, Ashok Pall, MD, 2 mg at 02/01/18 1857 .  omega-3 acid ethyl  esters (LOVAZA) capsule 1 g, 1 g, Oral, Daily, Ashok Pall, MD, 1 g at 02/05/18 9826 .  ondansetron (ZOFRAN) tablet 4 mg, 4 mg, Oral, Q6H PRN, 4 mg at 02/05/18 0836 **OR** ondansetron (ZOFRAN) injection 4 mg, 4 mg, Intravenous, Q6H PRN, Ashok Pall, MD .  oxyCODONE (Oxy IR/ROXICODONE) immediate release tablet 10 mg, 10 mg, Oral, Q3H PRN, Ashok Pall, MD, 10 mg at 02/03/18 2031 .  oxyCODONE (Oxy IR/ROXICODONE) immediate release tablet 5 mg, 5 mg, Oral, Q3H PRN, Ashok Pall, MD, 5 mg at 02/03/18 1756 .  oxyCODONE (OXYCONTIN) 12 hr tablet 10 mg, 10 mg, Oral, Q12H, Ashok Pall, MD, 10 mg at 02/05/18 4158 .  pantoprazole (PROTONIX) EC tablet 40 mg, 40 mg, Oral, Daily, Ashok Pall, MD, 40 mg at 02/05/18 0839 .  senna (SENOKOT) tablet 8.6 mg, 1 tablet, Oral, BID, Ashok Pall, MD, 8.6 mg at 02/05/18 0839 .  senna-docusate (Senokot-S) tablet 1 tablet, 1 tablet, Oral, QHS PRN, Ashok Pall, MD, 1 tablet at 02/04/18 2138 .  sodium chloride flush (NS) 0.9 % injection 3 mL, 3 mL, Intravenous, Q12H, Ashok Pall, MD, 3 mL at 02/05/18 0841 .  sodium chloride flush (NS) 0.9 % injection 3 mL, 3 mL, Intravenous, PRN, Ashok Pall, MD .  zolpidem (AMBIEN) tablet 5 mg, 5 mg, Oral, QHS PRN, Ashok Pall, MD   Physical Exam: AOx3, PERRL, EOMI, FS, TM, Strength 5/5 x4, SILTx4 Incision c/d/i  Assessment & Plan: 62 y.o. woman s/p L4-5/5-1 PLIF, recovering well. -discharge home today -area of right thigh pain consistent with meralgia paresthetica, should be self-limited  Judith Part  02/05/18 2:43 PM

## 2018-02-05 NOTE — Discharge Summary (Signed)
Discharge Summary  Date of Admission: 02/01/2018  Date of Discharge: 02/05/18  Attending Physician: Dr. Rozann Lesches Course: Patient was admitted following an uncomplicated F6-3/8-4 PLIF. They were recovered in PACU and transferred to a regular nursing floor. They were evaluated by PT and OT, who recommended discharge home. Their hospital course was uncomplicated and the patient was discharged home on 02/05/18. They will follow up in clinic with Dr. Christella Noa for a post-op visit.  Neurologic exam at discharge:  AOx3, PERRL, EOMI, FS, TM Strength 5/5 x4, SILTx4  Judith Part, MD 02/05/18 2:51 PM

## 2018-02-05 NOTE — Progress Notes (Signed)
Pt's cell phone found in bedsheet by housekeeping. Message left on home phone of Alanni Vader at [phone]. Cell phone in bag with clothing item at desk, labeled with name and room number.

## 2018-02-05 NOTE — Progress Notes (Signed)
Physical Therapy Treatment Patient Details Name: [name] MRN: [id number] DOB: January 22, 1956 Today's Date: 02/05/2018    History of Present Illness Patient is a 62 y/o female who presents s/p L4-5, L5-S1 PLIF. PMH includes HTN, DM, anxiety, aortic stenosis.     PT Comments    Patient seen for mobility progression s/p spinal surgery. Mobilizing well today with RW. Re-Educated patient on precautions, mobility expectations, safety and car transfers. Anticipate patient will be safe for d/c home.    Follow Up Recommendations  Home health PT;Supervision for mobility/OOB     Equipment Recommendations  Rolling walker with 5" wheels    Recommendations for Other Services       Precautions / Restrictions Precautions Precautions: Fall;Back Precaution Comments: pt able to recall 2/3 back precautions Required Braces or Orthoses: Spinal Brace Spinal Brace: Lumbar corset;Applied in sitting position Restrictions Weight Bearing Restrictions: No    Mobility  Bed Mobility Overal bed mobility: Needs Assistance Bed Mobility: Rolling;Sit to Sidelying Rolling: Supervision Sidelying to sit: Supervision       General bed mobility comments: no physical assist required today. Increased time to perform  Transfers Overall transfer level: Needs assistance Equipment used: Rolling walker (2 wheeled) Transfers: Sit to/from Stand Sit to Stand: Min guard         General transfer comment: Min guard for safety, no physical assist required  Ambulation/Gait Ambulation/Gait assistance: Supervision   Assistive device: Rolling walker (2 wheeled) Gait Pattern/deviations: Step-through pattern;Decreased stride length;Trunk flexed Gait velocity: decreased   General Gait Details: VCs for increased cadence, overall good stability with use of RW.   Stairs             Wheelchair Mobility    Modified Rankin (Stroke Patients Only)       Balance Overall balance assessment: Needs  assistance Sitting-balance support: Feet supported;No upper extremity supported Sitting balance-[name]: Good     Standing balance support: During functional activity;Bilateral upper extremity supported;Single extremity supported Standing balance-[name]: Fair Standing balance comment: able to release UE support briefly                            Cognition Arousal/Alertness: Awake/alert Behavior During Therapy: WFL for tasks assessed/performed Overall Cognitive Status: Within Functional Limits for tasks assessed                                        Exercises      General Comments        Pertinent Vitals/Pain Pain Assessment: Faces Faces Pain Scale: Hurts little more Pain Location: back Pain Descriptors / Indicators: Aching Pain Intervention(s): Monitored during session;Premedicated before session    Home Living                      Prior Function            PT Goals (current goals can now be found in the care plan section) Acute Rehab PT Goals Patient Stated Goal: to go home PT Goal Formulation: With patient Time For Goal Achievement: 02/16/18 Potential to Achieve Goals: Good Progress towards PT goals: Progressing toward goals    Frequency    Min 5X/week      PT Plan Current plan remains appropriate    Co-evaluation              AM-PAC PT "  6 Clicks" Daily Activity  Outcome Measure  Difficulty turning over in bed (including adjusting bedclothes, sheets and blankets)?: A Little Difficulty moving from lying on back to sitting on the side of the bed? : Unable Difficulty sitting down on and standing up from a chair with arms (e.g., wheelchair, bedside commode, etc,.)?: Unable Help needed moving to and from a bed to chair (including a wheelchair)?: A Little Help needed walking in hospital room?: A Little Help needed climbing 3-5 steps with a railing? : A Little 6 Click Score: 14    End of Session  Equipment Utilized During Treatment: Back brace;Gait belt Activity Tolerance: Patient tolerated treatment well Patient left: in bed;with call bell/phone within reach Nurse Communication: Mobility status PT Visit Diagnosis: Pain;Difficulty in walking, not elsewhere classified (R26.2);Muscle weakness (generalized) (M62.81) Pain - part of body: (back)     Time: 0865-7846 PT Time Calculation (min) (ACUTE ONLY): 18 min  Charges:  $Gait Training: 8-22 mins                     Alben Deeds, PT DPT  Board Certified Neurologic Specialist [place] Pager [phone] Office [phone]    Duncan Dull 02/05/2018, 10:28 AM

## 2018-02-05 NOTE — Progress Notes (Signed)
Nsg Discharge Note  Admit Date:  02/01/2018 Discharge date: 02/05/2018   [name] to be D/C'd Home per MD order.  AVS completed.  Copy for chart, and copy for patient signed, and dated. Patient/caregiver able to verbalize understanding.  Discharge Medication: Allergies as of 02/05/2018      Reactions   Atorvastatin Other (See Comments)   Myalgia   Sulfa Antibiotics Hives      Medication List    TAKE these medications   albuterol 108 (90 Base) MCG/ACT inhaler Commonly known as:  PROVENTIL HFA;VENTOLIN HFA Inhale 2 puffs into the lungs every 6 (six) hours as needed. What changed:  reasons to take this   amLODipine 10 MG tablet Commonly known as:  NORVASC Take 1 tablet (10 mg total) by mouth daily.   BLACK COHOSH PO Take 1 capsule by mouth daily.   celecoxib 200 MG capsule Commonly known as:  CELEBREX Take 200 mg by mouth 2 (two) times daily.   cetirizine 10 MG tablet Commonly known as:  ZYRTEC Take 1 tablet (10 mg total) by mouth daily.   clobetasol ointment 0.05 % Commonly known as:  TEMOVATE Apply 1 application topically every other day.   diazepam 5 MG tablet Commonly known as:  VALIUM Take 1 tablet (5 mg total) by mouth every 6 (six) hours as needed for muscle spasms.   docusate sodium 100 MG capsule Commonly known as:  COLACE Take 1 capsule (100 mg total) by mouth 2 (two) times daily as needed for up to 15 days (constipation).   fenofibrate 54 MG tablet Take 1 tablet (54 mg total) by mouth daily.   FISH OIL PO Take 1 capsule by mouth daily.   fluticasone 50 MCG/ACT nasal spray Commonly known as:  FLONASE Place 2 sprays into both nostrils daily.   gabapentin 300 MG capsule Commonly known as:  NEURONTIN Take 300 mg by mouth 3 (three) times daily.   iron polysaccharides 150 MG capsule Commonly known as:  NIFEREX Take 1 capsule (150 mg total) by mouth daily.   lansoprazole 30 MG capsule Commonly known as:  PREVACID Take 1 capsule (30 mg  total) by mouth daily.   losartan-hydrochlorothiazide 100-25 MG tablet Commonly known as:  HYZAAR Take 1 tablet by mouth daily.   metFORMIN 1000 MG tablet Commonly known as:  GLUCOPHAGE Take 1 tablet (1,000 mg total) by mouth 2 (two) times daily with a meal.   metoprolol succinate 100 MG 24 hr tablet Commonly known as:  TOPROL-XL Take 1 tablet (100 mg total) by mouth daily. Take with or immediately following a meal.   oxyCODONE 5 MG immediate release tablet Commonly known as:  Oxy IR/ROXICODONE Take 1 tablet (5 mg total) by mouth every 4 (four) hours as needed (pain).            Durable Medical Equipment  (From admission, onward)         Start     Ordered   02/03/18 1202  For home use only DME 3 n 1  Once     02/03/18 1201   02/03/18 1202  For home use only DME Walker rolling  Once    Question:  Patient needs a walker to treat with the following condition  Answer:  Status post lumbar surgery   02/03/18 1201          Discharge Assessment: Vitals:   02/05/18 0736 02/05/18 1135  BP: 115/65 (!) 109/55  Pulse: 81 78  Resp: 18 18  Temp:  98.8 F (37.1 C) 98.5 F (36.9 C)  SpO2: 98% 98%   Skin clean, dry and intact without evidence of skin break down, no evidence of skin tears noted. IV catheter discontinued intact. Site without signs and symptoms of complications - no redness or edema noted at insertion site, patient denies c/o pain - only slight tenderness at site.  Dressing with slight pressure applied.  D/c Instructions-Education: Discharge instructions given to patient/family with verbalized understanding. D/c education completed with patient/family including follow up instructions, medication list, d/c activities limitations if indicated, with other d/c instructions as indicated by MD - patient able to verbalize understanding, all questions fully answered. Patient instructed to return to ED, call 911, or call MD for any changes in condition.  Patient escorted via  [place], and D/C home via private auto.  Eda Keys, RN 02/05/2018 4:01 PM

## 2018-03-31 ENCOUNTER — Ambulatory Visit: Payer: BLUE CROSS/BLUE SHIELD | Admitting: Urgent Care

## 2018-05-01 MED FILL — metFORMIN HCL 1000 MG TABS: 1000 | 30 days supply | Qty: 60 | Fill #0

## 2018-05-01 MED FILL — LANSOPRAZOLE DR 30 MG CAP: 30 | 30 days supply | Qty: 30 | Fill #0

## 2018-05-04 MED FILL — AZITHROMYCIN 250 MG TABLET: 250 | 5 days supply | Qty: 6 | Fill #0

## 2018-07-17 MED FILL — metFORMIN HCL 1000 MG TABS: 1000 | 90 days supply | Qty: 180 | Fill #0

## 2018-10-19 DIAGNOSIS — E781 Pure hyperglyceridemia: Secondary | ICD-10-CM | POA: Insufficient documentation

## 2018-11-06 ENCOUNTER — Telehealth (HOSPITAL_COMMUNITY): Payer: Self-pay | Admitting: Radiology

## 2018-11-06 NOTE — Telephone Encounter (Signed)
Called to schedule echocardiogram. Phone rings busy.

## 2018-11-23 MED FILL — UNIFINE PENTIPS 6MM 31G: 31G X 6 MM | 90 days supply | Qty: 100 | Fill #0

## 2018-11-23 MED FILL — LOSARTAN-HCTZ 100-25 MG TAB: 100-25 | 30 days supply | Qty: 30 | Fill #0

## 2018-11-23 MED FILL — VICTOZA 2-PAK 18 MG/3 ML PE: 18 | 30 days supply | Qty: 6 | Fill #0

## 2018-12-22 DIAGNOSIS — E119 Type 2 diabetes mellitus without complications: Secondary | ICD-10-CM | POA: Insufficient documentation

## 2018-12-25 MED FILL — POLY-IRON 150 MG CAPSULE: 150 | 90 days supply | Qty: 180 | Fill #0

## 2018-12-27 MED FILL — VICTOZA 2-PAK 18 MG/3 ML PE: 18 | 30 days supply | Qty: 6 | Fill #0

## 2019-01-09 ENCOUNTER — Other Ambulatory Visit: Payer: Self-pay | Admitting: Family Medicine

## 2019-01-09 DIAGNOSIS — Z1231 Encounter for screening mammogram for malignant neoplasm of breast: Secondary | ICD-10-CM

## 2019-01-26 MED FILL — VICTOZA 18 MG/3 ML INJECT P: 18 | 30 days supply | Qty: 9 | Fill #0

## 2019-02-23 ENCOUNTER — Ambulatory Visit
Admission: RE | Admit: 2019-02-23 | Discharge: 2019-02-23 | Disposition: A | Discharge status: Home / Self Care | Payer: Medicare HMO | Source: Ambulatory Visit | Attending: Family Medicine | Admitting: Family Medicine

## 2019-02-23 ENCOUNTER — Other Ambulatory Visit: Payer: Self-pay

## 2019-02-23 DIAGNOSIS — Z1231 Encounter for screening mammogram for malignant neoplasm of breast: Secondary | ICD-10-CM

## 2019-03-01 MED FILL — UNIFINE PENTIPS 6MM 31G: 31G X 6 MM | 90 days supply | Qty: 100 | Fill #0

## 2019-03-04 NOTE — Progress Notes (Signed)
Virtual Visit via Telephone Note   This visit type was conducted due to national recommendations for restrictions regarding the COVID-19 Pandemic (e.g. social distancing) in an effort to limit this patient's exposure and mitigate transmission in our community.  Due to her co-morbid illnesses, this patient is at least at moderate risk for complications without adequate follow up.  This format is felt to be most appropriate for this patient at this time.  The patient did not have access to video technology/had technical difficulties with video requiring transitioning to audio format only (telephone).  All issues noted in this document were discussed and addressed.  No physical exam could be performed with this format.  Please refer to the patient's chart for her  consent to telehealth for [facility]   Date:  03/06/2019   ID:  [name], [name] 1955-07-26, MRN [id number]  Patient Location: Other:  Car Provider Location: Home  PCP:  Chesley Noon, MD  Cardiologist:  Jenkins Rouge, MD   Evaluation Performed:  Follow-Up Visit  Chief Complaint:  1 year follow up, seen for Dr. Johnsie Cancel   History of Present Illness:    [name] is a 63 y.o. female with a hx of HLD, HTN, DM2 who was last seen by Dr. Johnsie Cancel for cardiac clearance for spine surgery by Dr. Christella Noa.   Pt noted to have significant CRF's including the above and hx of heart disease in her father as well as HTN in her mother.   She was noted to have had an echocardiogram completed by Dr. Wynonia Lawman in 2017 that showed an LVEF of 60%, moderate LVH, mild ot moderate MR, AV sclerosis and LAE. There was no mention of bicuspid or subvalvular stenosis.   A repeat echocardiogram was performed in the setting of a loud murmur heard on physical exam. She was ultimately cleared for surgery, however plan was to follow with serial echos to assess for the development of AS.   Today [name] seems to be doing well from a cardiac  perspective.  She reports that her cervical spine surgery went well however still has residual numbness down her leg but her back pain is greatly improved.  She follows closely with her PCP for lab work in which she was seen approximately 3 to 4 weeks ago and was told that everything looked great.  Was unable to obtain her BP today as she is traveling however states that her BP was within normal range at her PCPs office.   She does state that on occasion, approximately once every 4 to 6 months, she will have "chest pain" which is unrelated to exertion with no associated shortness of breath, diaphoresis, nausea, vomiting or pain radiation.  She reports the last time that this occurred was while sitting in a movie theater.  She has a history of significant GERD in which she takes Prevacid daily.  She states the discomfort is not similar to her GERD.  She is vaguely describing her symptoms as a mild, fleeting pressure.  She has been recently exercising approximately 30 to 45 minutes/5 days/week without anginal symptoms.  We discussed that this is reassuring and likely not cardiac pain however given her cardiac risk factors we discussed possibly pursuing stress testing if symptoms become more frequent.    The patient does not have symptoms concerning for COVID-19 infection (fever, chills, cough, or new shortness of breath).    Past Medical History:  Diagnosis Date  . Allergy   . Anemia   .  Anxiety   . Aortic stenosis    moderate AS (01/11/18 echo)  . Arthritis   . Asthma   . BMI 40.0-44.9, adult ([place]) 04/07/2014  . DM type 2 (diabetes mellitus, type 2) ([place])   . Dyspnea   . GERD (gastroesophageal reflux disease)   . Heart murmur   . HTN (hypertension)   . Hypercholesterolemia   . Osteoarthritis of left hip 04/07/2014  . Reflux    Past Surgical History:  Procedure Laterality Date  . CESAREAN SECTION     x3  . COLONOSCOPY    . TUBAL LIGATION       Current Meds  Medication Sig  .  albuterol (PROAIR HFA) 108 (90 BASE) MCG/ACT inhaler Inhale 2 puffs into the lungs every 6 (six) hours as needed. (Patient taking differently: Inhale 2 puffs into the lungs every 6 (six) hours as needed for wheezing or shortness of breath. )  . amLODipine (NORVASC) 10 MG tablet Take 1 tablet (10 mg total) by mouth daily.  . celecoxib (CELEBREX) 200 MG capsule Take 200 mg by mouth 2 (two) times daily.   . cetirizine (ZYRTEC) 10 MG tablet Take 1 tablet (10 mg total) by mouth daily.  . fenofibrate 54 MG tablet Take 1 tablet (54 mg total) by mouth daily.  . ferrous sulfate 325 (65 FE) MG EC tablet Take 325 mg by mouth daily.  Marland Kitchen gabapentin (NEURONTIN) 100 MG capsule Take 100 mg by mouth 3 (three) times daily.  Marland Kitchen gabapentin (NEURONTIN) 300 MG capsule Take 300 mg by mouth 3 (three) times daily.   . iron polysaccharides (NIFEREX) 150 MG capsule Take 1 capsule (150 mg total) by mouth daily.  . lansoprazole (PREVACID) 30 MG capsule Take 1 capsule (30 mg total) by mouth daily.  Marland Kitchen liraglutide (VICTOZA) 18 MG/3ML SOPN Inject 0.6 mg into the skin daily.  Marland Kitchen losartan-hydrochlorothiazide (HYZAAR) 100-25 MG tablet Take 1 tablet by mouth daily.  . metFORMIN (GLUCOPHAGE) 1000 MG tablet Take 1 tablet (1,000 mg total) by mouth 2 (two) times daily with a meal.  . metoprolol succinate (TOPROL-XL) 100 MG 24 hr tablet Take 1 tablet (100 mg total) by mouth daily. Take with or immediately following a meal.  . Omega-3 Fatty Acids (FISH OIL PO) Take 1 capsule by mouth daily.  Marland Kitchen oxyCODONE (OXY IR/ROXICODONE) 5 MG immediate release tablet Take 1 tablet (5 mg total) by mouth every 4 (four) hours as needed (pain).     Allergies:   Atorvastatin and Sulfa antibiotics   Social History   Tobacco Use  . Smoking status: Never Smoker  . Smokeless tobacco: Never Used  Substance Use Topics  . Alcohol use: No    Alcohol/week: 0.0 standard drinks  . Drug use: No     Family Hx: The patient's family history includes Heart disease  in her father; Hypertension in her mother; Multiple sclerosis in her brother and sister; Stroke in her brother and mother. There is no history of Colon cancer, Colon polyps, Esophageal cancer, Rectal cancer, or Stomach cancer.  ROS:   Please see the history of present illness.     All other systems reviewed and are negative.  Prior CV studies:   The following studies were reviewed today:  Echocardiogram 01/11/2018:  Left ventricle: The cavity size was normal. There was mild   concentric hypertrophy. Systolic function was vigorous. The   estimated ejection fraction was in the range of 65% to 70%. Wall   motion was normal; there were no regional  wall motion   abnormalities. Doppler parameters are consistent with abnormal   left ventricular relaxation (grade 1 diastolic dysfunction).   Doppler parameters are consistent with high ventricular filling   pressure. - Aortic valve: Valve mobility was restricted. There was moderate   stenosis. There was no regurgitation. Peak velocity (S): 327   cm/s. Mean gradient (S): 26 mm Hg. Valve area (VTI): 0.99 cm^2.   Valve area (Vmax): 0.85 cm^2. Valve area (Vmean): 0.96 cm^2. - Mitral valve: Transvalvular velocity was within the normal range.   There was no evidence for stenosis. There was trivial   regurgitation. - Right ventricle: The cavity size was normal. Wall thickness was   normal. Systolic function was normal. - Atrial septum: No defect or patent foramen ovale was identified   by color flow Doppler. - Tricuspid valve: There was no regurgitation.  Impressions:  - Global longitudinal strain tracking is inaccurate.  Labs/Other Tests and Data Reviewed:    EKG:  An ECG dated 10/29/2017 was personally reviewed today and demonstrated:  NSR with nonspecific T wave abnormalities  Recent Labs: No results found for requested labs within last 8760 hours.   Recent Lipid Panel Lab Results  Component Value Date/Time   CHOL 247 (H) 09/30/2017  03:54 PM   TRIG 179 (H) 09/30/2017 03:54 PM   HDL 50 09/30/2017 03:54 PM   CHOLHDL 4.9 (H) 09/30/2017 03:54 PM   CHOLHDL 3.2 11/12/2015 02:36 PM   LDLCALC 161 (H) 09/30/2017 03:54 PM    Wt Readings from Last 3 Encounters:  03/06/19 202 lb (91.6 kg)  02/01/18 210 lb 12.8 oz (95.6 kg)  01/25/18 210 lb 12.8 oz (95.6 kg)     Objective:    Vital Signs:  Ht 4\' 11"  (1.499 m)   Wt 202 lb (91.6 kg)   BMI 40.80 kg/m    VITAL SIGNS:  reviewed GEN:  no acute distress RESPIRATORY:  normal respiratory effort NEURO:  alert and oriented x 3 PSYCH:  normal affect  ASSESSMENT & PLAN:    1. HTN: -Reports BP stable at last PCP visit 3 to 4 weeks ago -Unable to obtain home BP at this time -Continue losartan-HCTZ 100-25 -Continue Toprol XL 100  2. HLD: -Last LDL by PCP 3 to 4 weeks ago>>to be faxed to our office  -Noted to be intolerant to Lipitor with mylagias -Continue fenofibrate  3. DM2:  -Followed by PCP -Lab work to be faxed to our office   4. Moderate AS: -Last echocardiogram from 01/11/2018 with moderate AS -Will follow with repeat study today  -No symptoms   5. Chest pain: -Reports fleeting non-exertional chest pain that has happened approximately every 4-6 months. Lasts a short duration then dissipates on its own. No associated symptoms. Has been exercising 30 min/day 5 days/week without recurrence. Likely no cardiac however does have CRFs. Discussed monitoring for increased frequency and exertional qualities. -Will send to Dr. Johnsie Cancel for review for possible stress testing however will likely follow closely for now and continue with aggressive risk modification.    COVID-19 Education: The signs and symptoms of COVID-19 were discussed with the patient and how to seek care for testing (follow up with PCP or arrange E-visit). The importance of social distancing was discussed today.  Time:   Today, I have spent 20 minutes with the patient with telehealth technology  discussing the above problems.    Medication Adjustments/Labs and Tests Ordered: Current medicines are reviewed at length with the patient today.  Concerns regarding medicines  are outlined above.   Tests Ordered: No orders of the defined types were placed in this encounter.   Medication Changes: No orders of the defined types were placed in this encounter.   Follow Up:  Either In Person or Virtual Visit Dr. Johnsie Cancel in 6 months  Signed, Kathyrn Drown, NP  03/06/2019 11:40 AM    [place]

## 2019-03-06 ENCOUNTER — Telehealth (INDEPENDENT_AMBULATORY_CARE_PROVIDER_SITE_OTHER): Payer: Medicare HMO | Admitting: Cardiology

## 2019-03-06 ENCOUNTER — Encounter: Payer: Self-pay | Admitting: *Deleted

## 2019-03-06 ENCOUNTER — Other Ambulatory Visit: Payer: Self-pay

## 2019-03-06 ENCOUNTER — Encounter: Payer: Self-pay | Admitting: Cardiology

## 2019-03-06 VITALS — Ht 59.0 in | Wt 202.0 lb

## 2019-03-06 DIAGNOSIS — R011 Cardiac murmur, unspecified: Secondary | ICD-10-CM

## 2019-03-06 DIAGNOSIS — E7800 Pure hypercholesterolemia, unspecified: Secondary | ICD-10-CM | POA: Diagnosis not present

## 2019-03-06 DIAGNOSIS — I10 Essential (primary) hypertension: Secondary | ICD-10-CM

## 2019-03-06 DIAGNOSIS — I350 Nonrheumatic aortic (valve) stenosis: Secondary | ICD-10-CM | POA: Diagnosis not present

## 2019-03-06 DIAGNOSIS — R079 Chest pain, unspecified: Secondary | ICD-10-CM

## 2019-03-06 NOTE — Progress Notes (Signed)
Should have f/u lexiscan myovue given moderate AS and chest pain

## 2019-03-06 NOTE — Patient Instructions (Addendum)
Medication Instructions:   If you need a refill on your cardiac medications before your next appointment, please call your pharmacy.   Lab work: NONE ORDERED  TODAY  If you have labs (blood work) drawn today and your tests are completely normal, you will receive your results only by: Marland Kitchen MyChart Message (if you have MyChart) OR . A paper copy in the mail If you have any lab test that is abnormal or we need to change your treatment, we will call you to review the results.  Testing/Procedures: Your physician has requested that you have an echocardiogram. Echocardiography is a painless test that uses sound waves to create images of your heart. It provides your doctor with information about the size and shape of your heart and how well your heart's chambers and valves are working. This procedure takes approximately one hour. There are no restrictions for this procedure.  Your physician has requested that you have a lexiscan myoview. For further information please visit [email or web address]. Please follow instruction sheet, as given.  Follow-Up: At [facility], you and your health needs are our priority.  As part of our continuing mission to provide you with exceptional heart care, we have created designated Provider Care Teams.  These Care Teams include your primary Cardiologist (physician) and Advanced Practice Providers (APPs -  Physician Assistants and Nurse Practitioners) who all work together to provide you with the care you need, when you need it. You will need a follow up appointment in 6 months.  Please call our office 2 months in advance to schedule this appointment.  You may see Jenkins Rouge, MD or one of the following Advanced Practice Providers on your designated Care Team:   Truitt Merle, NP Cecilie Kicks, NP . Kathyrn Drown, NP  Any Other Special Instructions Will Be Listed Below (If Applicable).

## 2019-03-06 NOTE — Addendum Note (Signed)
Addended by: Claude Manges on: 03/06/2019 12:39 PM   Modules accepted: Orders

## 2019-03-07 MED FILL — VICTOZA 18 MG/3 ML INJECT P: 18 | 30 days supply | Qty: 9 | Fill #0

## 2019-03-08 ENCOUNTER — Encounter (HOSPITAL_COMMUNITY): Payer: Self-pay | Admitting: Cardiology

## 2019-03-14 ENCOUNTER — Telehealth (HOSPITAL_COMMUNITY): Payer: Self-pay | Admitting: *Deleted

## 2019-03-14 NOTE — Telephone Encounter (Signed)
Left message on voicemail in reference to upcoming appointment scheduled for 03/21/2019. Phone number given for a call back so details instructions can be given. Jaimere Feutz, [place] No mychart

## 2019-03-15 MED FILL — TOPIRAMATE 25 MG TAB: 25 | 30 days supply | Qty: 30 | Fill #0

## 2019-03-19 ENCOUNTER — Telehealth (HOSPITAL_COMMUNITY): Payer: Self-pay | Admitting: *Deleted

## 2019-03-19 NOTE — Telephone Encounter (Signed)
Left message on voicemail in reference to upcoming appointment scheduled for 03/21/19 Phone number given for a call back so details instructions can be given. Adir Schicker Jacqueline   

## 2019-03-20 ENCOUNTER — Telehealth (HOSPITAL_COMMUNITY): Payer: Self-pay | Admitting: *Deleted

## 2019-03-20 NOTE — Telephone Encounter (Signed)
Patient given detailed instructions per Myocardial Perfusion Study Information Sheet for the test on 03/21/2019 at 1030. Patient notified to arrive 15 minutes early and that it is imperative to arrive on time for appointment to keep from having the test rescheduled.  If you need to cancel or reschedule your appointment, please call the office within 24 hours of your appointment. . Patient verbalized understanding.[name], [place] No mychart available

## 2019-03-21 ENCOUNTER — Ambulatory Visit (HOSPITAL_COMMUNITY): Payer: Medicare HMO | Attending: Cardiology

## 2019-03-21 ENCOUNTER — Other Ambulatory Visit: Payer: Self-pay

## 2019-03-21 DIAGNOSIS — R079 Chest pain, unspecified: Secondary | ICD-10-CM

## 2019-03-21 MED ORDER — REGADENOSON 0.4 MG/5ML IV SOLN
0.4000 mg | Freq: Once | INTRAVENOUS | Status: AC
  Administered 2019-03-21: 0.4 mg via INTRAVENOUS

## 2019-03-21 MED ORDER — TECHNETIUM TC 99M TETROFOSMIN IV KIT
32.9000 | PACK | Freq: Once | INTRAVENOUS | Status: AC | PRN
  Administered 2019-03-21: 32.9 via INTRAVENOUS
  Filled 2019-03-21: qty 33

## 2019-03-26 ENCOUNTER — Other Ambulatory Visit: Payer: Self-pay

## 2019-03-26 ENCOUNTER — Encounter (INDEPENDENT_AMBULATORY_CARE_PROVIDER_SITE_OTHER): Payer: Self-pay

## 2019-03-26 ENCOUNTER — Ambulatory Visit (HOSPITAL_COMMUNITY): Payer: Medicare HMO | Attending: Internal Medicine

## 2019-03-26 LAB — MYOCARDIAL PERFUSION IMAGING
LV dias vol: 59 mL (ref 46–106)
LV sys vol: 24 mL
Peak BP: 15765 mmHg
Peak HR: 108 {beats}/min
Rest HR: 75 {beats}/min
SDS: 0
SRS: 1
SSS: 0
TID: 1.05

## 2019-03-26 MED ORDER — TECHNETIUM TC 99M TETROFOSMIN IV KIT
31.3000 | PACK | Freq: Once | INTRAVENOUS | Status: AC | PRN
  Administered 2019-03-26: 31.3 via INTRAVENOUS
  Filled 2019-03-26: qty 32

## 2019-06-15 MED FILL — POLY-IRON 150 MG CAPSULE: 150 | 90 days supply | Qty: 180 | Fill #1

## 2019-07-09 MED FILL — POLY-IRON 150 MG CAPSULE: 150 | 90 days supply | Qty: 180 | Fill #1

## 2019-08-30 NOTE — Progress Notes (Signed)
Telehealth Visit     Virtual Visit via Video Note   This visit type was conducted due to national recommendations for restrictions regarding the COVID-19 Pandemic (e.g. social distancing) in an effort to limit this patient's exposure and mitigate transmission in our community.  Due to her co-morbid illnesses, this patient is at least at moderate risk for complications without adequate follow up.  This format is felt to be most appropriate for this patient at this time.  All issues noted in this document were discussed and addressed.  A limited physical exam was performed with this format.  Please refer to the patient's chart for her consent to telehealth for [facility].   Evaluation Performed:  Follow-up visit  This visit type was conducted due to national recommendations for restrictions regarding the COVID-19 Pandemic (e.g. social distancing).  This format is felt to be most appropriate for this patient at this time.  All issues noted in this document were discussed and addressed.  No physical exam was performed (except for noted visual exam findings with Video Visits).  Please refer to the patient's chart (MyChart message for video visits and phone note for telephone visits) for the patient's consent to telehealth for [facility].  Date:  09/03/2019   ID:  Abriana, Gangloff June 09, 1955, MRN [id number]  Patient Location:  Home  Provider location:   Home  PCP:  Chesley Noon, MD  Cardiologist:  Jenkins Rouge, MD  Electrophysiologist:  None   Chief Complaint:  Home  History of Present Illness:    [name] is a 64 y.o. female who presents via audio/video conferencing for a telehealth visit today.  Seen for Dr. Johnsie Cancel.   She has a history of HLD, HTN, and DM2 who was last seen by Dr. Johnsie Cancel for cardiac clearance for spine surgery by Dr. Christella Noa in August of 2019. Last echo in 2019 - has moderate AS noted.   Last seen by Kathyrn Drown, NP for telehealth visit back  in October - had had her back surgery - still with some residual numbness down her leg. Some atypical chest pain noted. Myoview was updated and was reassuring. Looks like her echo was to be updated - but does not appear to have occurred.   The patient does not have symptoms concerning for COVID-19 infection (fever, chills, cough, or new shortness of breath).   Seen today by a telephone call. She declined Caregility video. She has consented for this visit. She did have COVID back in December and she did recover after about 2 weeks. She has been vaccinated. 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 She denies chest pain. She will have DOE - with going up steps. She attributes this to needing to lose weight. She feels it has not progressed. Denies being dizzy or having syncope. Lab done last month with her PCP - her A1C is creeping up. Lipids not good but had been off her statin for about a month - now back on. She feels like she is doing ok overall. Not able to check BP.   Past Medical History:  Diagnosis Date  . Allergy   . Anemia   . Anxiety   . Aortic stenosis    moderate AS (01/11/18 echo)  . Arthritis   . Asthma   . BMI 40.0-44.9, adult ([place]) 04/07/2014  . DM type 2 (diabetes mellitus, type 2) ([place])   . Dyspnea   . GERD (gastroesophageal reflux disease)   . Heart murmur   . HTN (  hypertension)   . Hypercholesterolemia   . Osteoarthritis of left hip 04/07/2014  . Reflux    Past Surgical History:  Procedure Laterality Date  . CESAREAN SECTION     x3  . COLONOSCOPY    . TUBAL LIGATION       Current Meds  Medication Sig  . albuterol (PROAIR HFA) 108 (90 BASE) MCG/ACT inhaler Inhale 2 puffs into the lungs every 6 (six) hours as needed. (Patient taking differently: Inhale 2 puffs into the lungs every 6 (six) hours as needed for wheezing or shortness of breath. )  . amLODipine (NORVASC) 10 MG tablet Take 1 tablet (10 mg total) by mouth daily.  . celecoxib (CELEBREX) 200 MG capsule Take 200 mg by  mouth 2 (two) times daily.   . cetirizine (ZYRTEC) 10 MG tablet Take 1 tablet (10 mg total) by mouth daily.  . fenofibrate 54 MG tablet Take 1 tablet (54 mg total) by mouth daily.  . ferrous sulfate 325 (65 FE) MG EC tablet Take 325 mg by mouth daily.  Marland Kitchen gabapentin (NEURONTIN) 300 MG capsule Take 300 mg by mouth 3 (three) times daily.   . iron polysaccharides (NIFEREX) 150 MG capsule Take 1 capsule (150 mg total) by mouth daily.  . lansoprazole (PREVACID) 30 MG capsule Take 1 capsule (30 mg total) by mouth daily.  Marland Kitchen losartan-hydrochlorothiazide (HYZAAR) 100-25 MG tablet Take 1 tablet by mouth daily.  . metFORMIN (GLUCOPHAGE) 1000 MG tablet Take 1 tablet (1,000 mg total) by mouth 2 (two) times daily with a meal.  . metoprolol succinate (TOPROL-XL) 100 MG 24 hr tablet Take 1 tablet (100 mg total) by mouth daily. Take with or immediately following a meal.  . [DISCONTINUED] Omega-3 Fatty Acids (FISH OIL PO) Take 1 capsule by mouth daily.     Allergies:   Atorvastatin and Sulfa antibiotics   Social History   Tobacco Use  . Smoking status: Never Smoker  . Smokeless tobacco: Never Used  Substance Use Topics  . Alcohol use: No    Alcohol/week: 0.0 standard drinks  . Drug use: No     Family Hx: The patient's family history includes Heart disease in her father; Hypertension in her mother; Multiple sclerosis in her brother and sister; Stroke in her brother and mother. There is no history of Colon cancer, Colon polyps, Esophageal cancer, Rectal cancer, or Stomach cancer.  ROS:   Please see the history of present illness.   All other systems reviewed are negative.    Objective:    Vital Signs:  Ht 4\' 11"  (1.499 m)   Wt 205 lb (93 kg)   BMI 41.40 kg/m    Wt Readings from Last 3 Encounters:  09/03/19 205 lb (93 kg)  03/21/19 207 lb (93.9 kg)  03/06/19 202 lb (91.6 kg)    Alert female in no acute distress. Does not sound short of breath with conversation.    Labs/Other Tests and Data  Reviewed:    Lab Results  Component Value Date   WBC 10.1 02/01/2018   HGB 10.2 (L) 02/01/2018   HCT 33.4 (L) 02/01/2018   PLT 324 02/01/2018   GLUCOSE 129 (H) 01/25/2018   CHOL 247 (H) 09/30/2017   TRIG 179 (H) 09/30/2017   HDL 50 09/30/2017   LDLCALC 161 (H) 09/30/2017   ALT 22 08/19/2017   AST 20 08/19/2017   NA 142 01/25/2018   K 4.0 01/25/2018   CL 105 01/25/2018   CREATININE 1.05 (H) 02/01/2018   BUN  23 (H) 01/25/2018   CO2 27 01/25/2018   TSH 1.430 09/30/2017   HGBA1C 7.0 (H) 01/25/2018   MICROALBUR 36.6 04/08/2015     BNP (last 3 results) No results for input(s): BNP in the last 8760 hours.  ProBNP (last 3 results) No results for input(s): PROBNP in the last 8760 hours.    Prior CV studies:    The following studies were reviewed today:  Myoview Study Highlights 02/2019   Lexiscan stress with no EKG changes to suggest ischemia  Normal perfusion No ischemia  LVEF 60%  This is a low risk study.    Echocardiogram 01/11/2018:  Left ventricle: The cavity size was normal. There was mild concentric hypertrophy. Systolic function was vigorous. The estimated ejection fraction was in the range of 65% to 70%. Wall motion was normal; there were no regional wall motion abnormalities. Doppler parameters are consistent with abnormal left ventricular relaxation (grade 1 diastolic dysfunction). Doppler parameters are consistent with high ventricular filling pressure. - Aortic valve: Valve mobility was restricted. There was moderate stenosis. There was no regurgitation. Peak velocity (S): 327 cm/s. Mean gradient (S): 26 mm Hg. Valve area (VTI): 0.99 cm^2. Valve area (Vmax): 0.85 cm^2. Valve area (Vmean): 0.96 cm^2. - Mitral valve: Transvalvular velocity was within the normal range. There was no evidence for stenosis. There was trivial regurgitation. - Right ventricle: The cavity size was normal. Wall thickness was normal. Systolic  function was normal. - Atrial septum: No defect or patent foramen ovale was identified by color flow Doppler. - Tricuspid valve: There was no regurgitation.  Impressions:  - Global longitudinal strain tracking is inaccurate.  ASSESSMENT & PLAN:    1. DOE - seems to be at her baseline - we will get her echo updated.   2. HTN - unclear what her BP is doing - she is on her medicines.   3. HLD - back on therapy by her report. Labs are checked by PCP.   4. AS - will get her echo updated.   5. Prior atypical chest pain - low risk Myoview from October - this was reviewed with her again today - she has had no further chest pain.   6. DM - per PCP  7. Obesity  8. COVID-19 Education: The signs and symptoms of COVID-19 were discussed with the patient and how to seek care for testing (follow up with PCP or arrange E-visit).  The importance of social distancing, staying at home, hand hygiene and wearing a mask when out in public were discussed today. She has been vaccinated.   Patient Risk:   After full review of this patient's clinical status, I feel that they are at least moderate risk at this time.  Time:   Today, I have spent 7 minutes with the patient with telehealth technology discussing the above issues.     Medication Adjustments/Labs and Tests Ordered: Current medicines are reviewed at length with the patient today.  Concerns regarding medicines are outlined above.   Tests Ordered: Orders Placed This Encounter  Procedures  . ECHOCARDIOGRAM COMPLETE    Medication Changes: No orders of the defined types were placed in this encounter.   Disposition:  FU with Dr. Johnsie Cancel in 6 months. Will get echo updated.     Patient is agreeable to this plan and will call if any problems develop in the interim.   Amie Critchley, NP  09/03/2019 11:33 AM    [place]

## 2019-09-03 ENCOUNTER — Telehealth: Payer: Self-pay | Admitting: *Deleted

## 2019-09-03 ENCOUNTER — Encounter: Payer: Self-pay | Admitting: Nurse Practitioner

## 2019-09-03 ENCOUNTER — Other Ambulatory Visit: Payer: Self-pay

## 2019-09-03 ENCOUNTER — Telehealth (INDEPENDENT_AMBULATORY_CARE_PROVIDER_SITE_OTHER): Payer: Medicare HMO | Admitting: Nurse Practitioner

## 2019-09-03 VITALS — Ht 59.0 in | Wt 205.0 lb

## 2019-09-03 DIAGNOSIS — I10 Essential (primary) hypertension: Secondary | ICD-10-CM | POA: Diagnosis not present

## 2019-09-03 DIAGNOSIS — I350 Nonrheumatic aortic (valve) stenosis: Secondary | ICD-10-CM | POA: Diagnosis not present

## 2019-09-03 DIAGNOSIS — R0609 Other forms of dyspnea: Secondary | ICD-10-CM

## 2019-09-03 DIAGNOSIS — E669 Obesity, unspecified: Secondary | ICD-10-CM

## 2019-09-03 DIAGNOSIS — E119 Type 2 diabetes mellitus without complications: Secondary | ICD-10-CM

## 2019-09-03 DIAGNOSIS — R0600 Dyspnea, unspecified: Secondary | ICD-10-CM | POA: Diagnosis not present

## 2019-09-03 DIAGNOSIS — E7800 Pure hypercholesterolemia, unspecified: Secondary | ICD-10-CM | POA: Diagnosis not present

## 2019-09-03 DIAGNOSIS — Z7189 Other specified counseling: Secondary | ICD-10-CM

## 2019-09-03 NOTE — Patient Instructions (Addendum)
After Visit Summary:  We will be checking the following labs today - NONE   Medication Instructions:    Continue with your current medicines.    If you need a refill on your cardiac medications before your next appointment, please call your pharmacy.     Testing/Procedures To Be Arranged:  Echocardiogram  Follow-Up:   See Dr. Johnsie Cancel in about 6 months -  You will receive a reminder letter in the mail two months in advance. If you don't receive a letter, please call our office to schedule the follow-up appointment.     At [facility], you and your health needs are our priority.  As part of our continuing mission to provide you with exceptional heart care, we have created designated Provider Care Teams.  These Care Teams include your primary Cardiologist (physician) and Advanced Practice Providers (APPs -  Physician Assistants and Nurse Practitioners) who all work together to provide you with the care you need, when you need it.  Special Instructions:  . Stay safe, stay home, wash your hands for at least 20 seconds and wear a mask when out in public.  . It was good to talk with you today.    Call the [place] office at [phone] if you have any questions, problems or concerns.

## 2019-09-03 NOTE — Telephone Encounter (Signed)
  Patient Consent for Virtual Visit    {     [name] has provided verbal consent on 09/03/2019 for a virtual visit (video or telephone).   CONSENT FOR VIRTUAL VISIT FOR:  [name]  By participating in this virtual visit I agree to the following:  I hereby voluntarily request, consent and authorize [facility] and its employed or contracted physicians, Engineer, materials, nurse practitioners or other licensed health care professionals (the Practitioner), to provide me with telemedicine health care services (the "Services") as deemed necessary by the treating Practitioner. I acknowledge and consent to receive the Services by the Practitioner via telemedicine. I understand that the telemedicine visit will involve communicating with the Practitioner through live audiovisual communication technology and the disclosure of certain medical information by electronic transmission. I acknowledge that I have been given the opportunity to request an in-person assessment or other available alternative prior to the telemedicine visit and am voluntarily participating in the telemedicine visit.  I understand that I have the right to withhold or withdraw my consent to the use of telemedicine in the course of my care at any time, without affecting my right to future care or treatment, and that the Practitioner or I may terminate the telemedicine visit at any time. I understand that I have the right to inspect all information obtained and/or recorded in the course of the telemedicine visit and may receive copies of available information for a reasonable fee.  I understand that some of the potential risks of receiving the Services via telemedicine include:  Marland Kitchen Delay or interruption in medical evaluation due to technological equipment failure or disruption; . Information transmitted may not be sufficient (e.g. poor resolution of images) to allow for appropriate medical decision making by the  Practitioner; and/or  . In rare instances, security protocols could fail, causing a breach of personal health information.  Furthermore, I acknowledge that it is my responsibility to provide information about my medical history, conditions and care that is complete and accurate to the best of my ability. I acknowledge that Practitioner's advice, recommendations, and/or decision may be based on factors not within their control, such as incomplete or inaccurate data provided by me or distortions of diagnostic images or specimens that may result from electronic transmissions. I understand that the practice of medicine is not an exact science and that Practitioner makes no warranties or guarantees regarding treatment outcomes. I acknowledge that a copy of this consent can be made available to me via my patient portal ([place] [facility]), or I can request a printed copy by calling the office of [facility].    I understand that my insurance will be billed for this visit.   I have read or had this consent read to me. . I understand the contents of this consent, which adequately explains the benefits and risks of the Services being provided via telemedicine.  . I have been provided ample opportunity to ask questions regarding this consent and the Services and have had my questions answered to my satisfaction. . I give my informed consent for the services to be provided through the use of telemedicine in my medical care

## 2019-10-03 ENCOUNTER — Ambulatory Visit (HOSPITAL_COMMUNITY): Payer: Medicare HMO | Attending: Internal Medicine

## 2019-10-03 ENCOUNTER — Other Ambulatory Visit: Payer: Self-pay

## 2019-10-03 DIAGNOSIS — I350 Nonrheumatic aortic (valve) stenosis: Secondary | ICD-10-CM | POA: Insufficient documentation

## 2019-10-03 DIAGNOSIS — R0600 Dyspnea, unspecified: Secondary | ICD-10-CM | POA: Insufficient documentation

## 2019-10-03 DIAGNOSIS — R0609 Other forms of dyspnea: Secondary | ICD-10-CM

## 2019-10-04 ENCOUNTER — Other Ambulatory Visit: Payer: Self-pay | Admitting: *Deleted

## 2019-10-04 DIAGNOSIS — I350 Nonrheumatic aortic (valve) stenosis: Secondary | ICD-10-CM

## 2019-10-04 NOTE — Progress Notes (Signed)
error 

## 2019-12-19 ENCOUNTER — Other Ambulatory Visit (HOSPITAL_COMMUNITY): Payer: Self-pay | Admitting: Nurse Practitioner

## 2019-12-19 MED FILL — POLY-IRON 150 MG CAPSULE: 150 | 80 days supply | Qty: 160 | Fill #0

## 2019-12-22 IMAGING — MG MM DIGITAL SCREENING BILAT W/ TOMO W/ CAD
6 of 10 series · 6 of 30 positions shown · non-contrast
Comparison: Previous exam(s).

CLINICAL DATA: Screening.

EXAM:
DIGITAL SCREENING BILATERAL MAMMOGRAM WITH TOMO AND CAD

[R CC synth-2D]
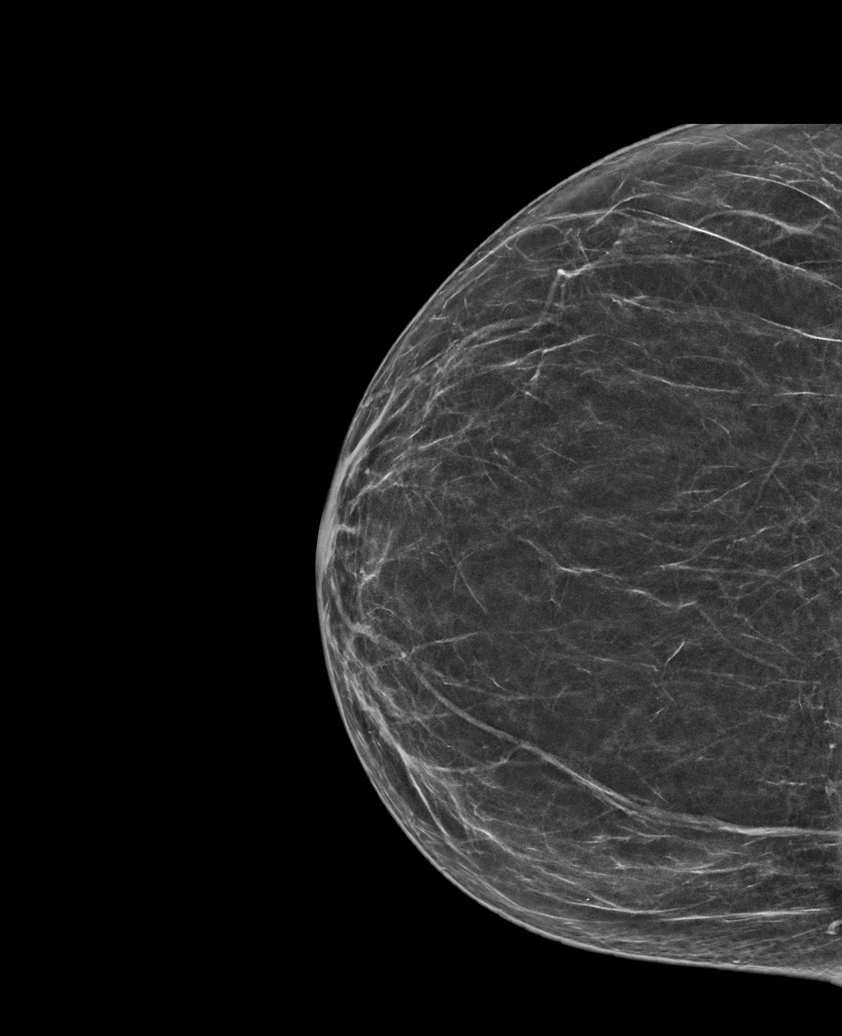

[L CC synth-2D (1 of 2)]
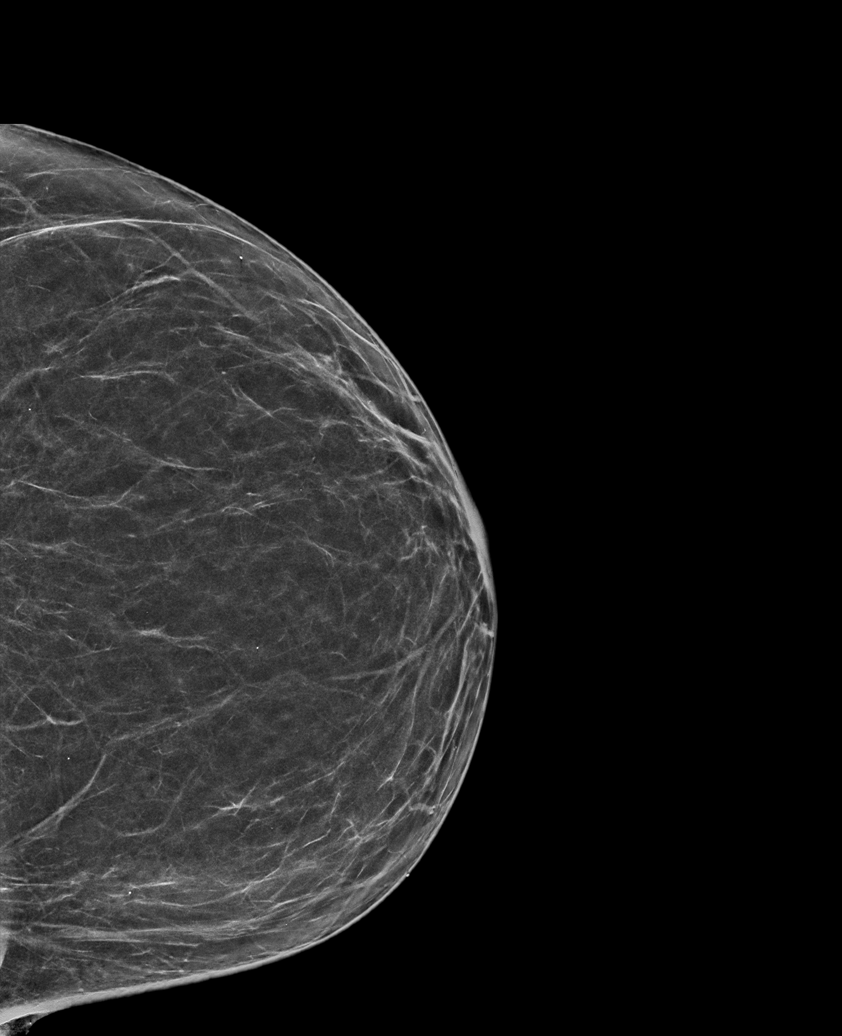

[L MLO synth-2D]
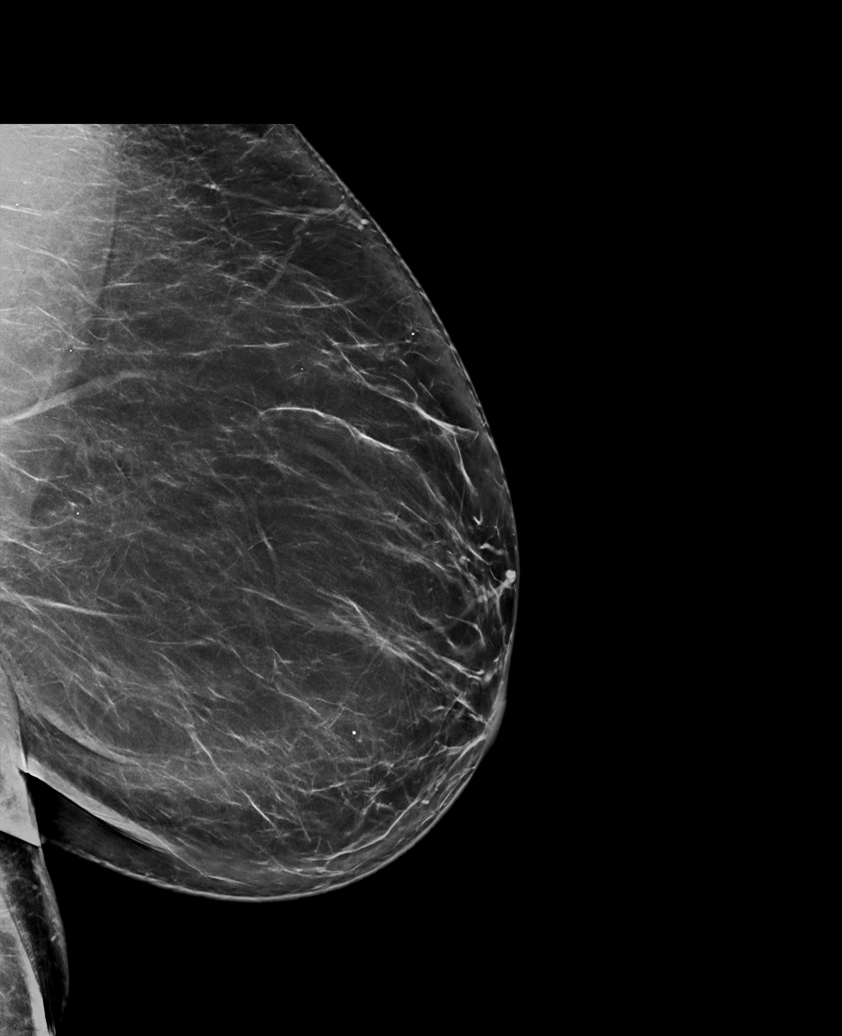

[L CC synth-2D (2 of 2)]
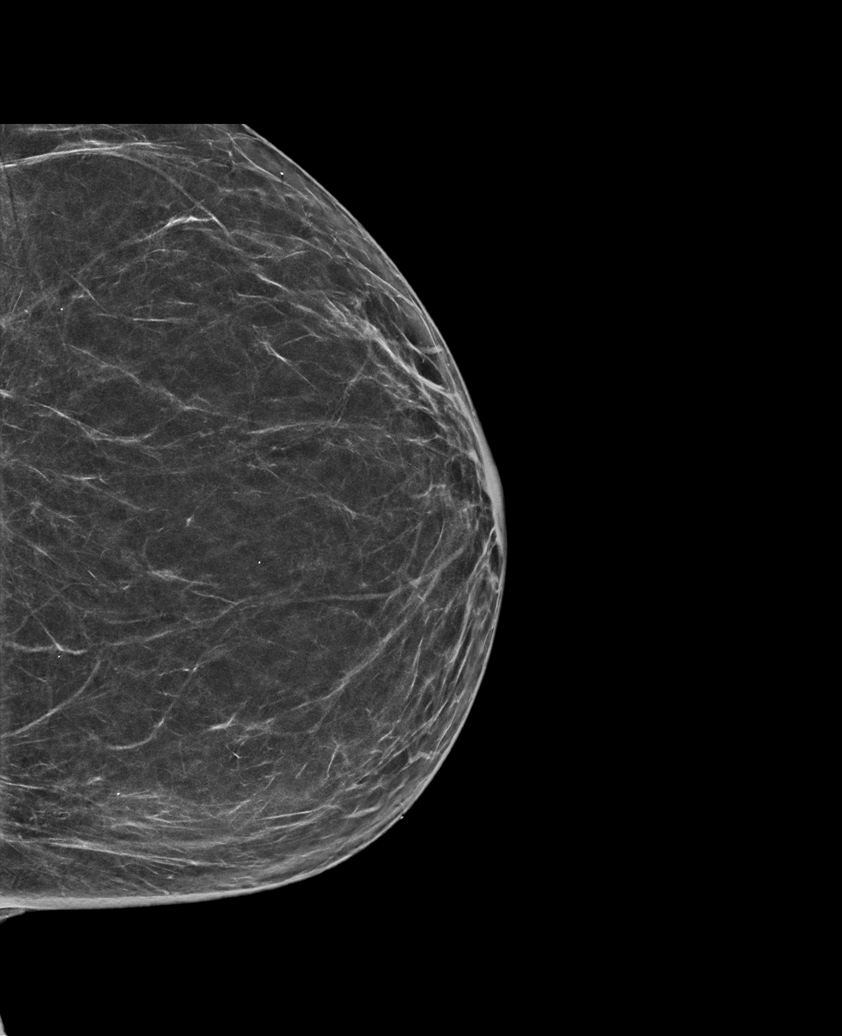

[R MLO synth-2D]
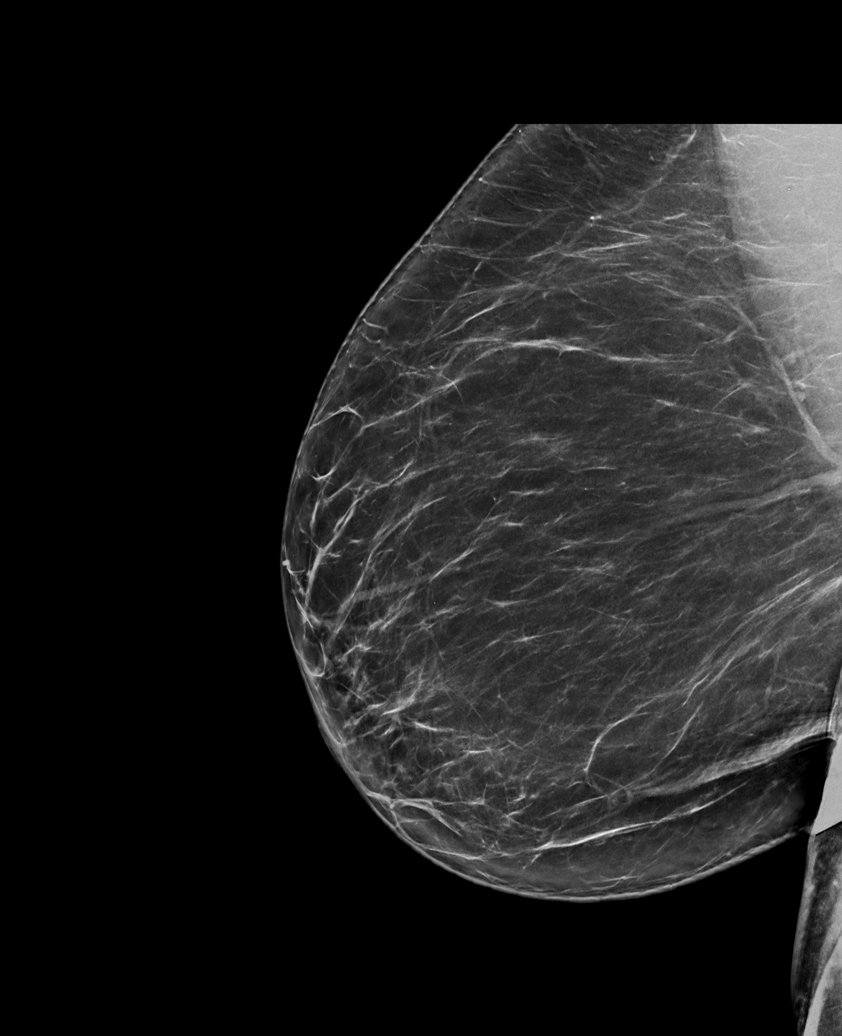

[L MLO tomo · tomo slice 45/88.0]
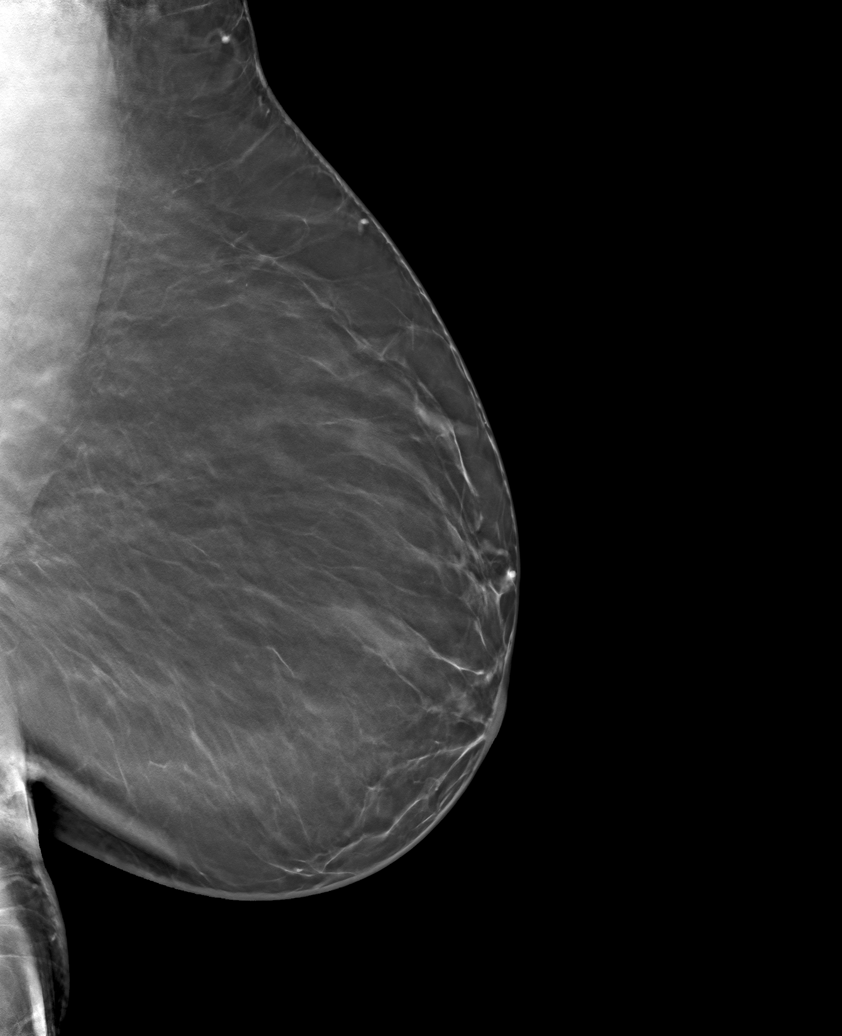

[6 of 30 positions shown; findings below may reference images not displayed]

ACR Breast Density Category b: There are scattered areas of
fibroglandular density.
FINDINGS: There are no findings suspicious for malignancy. Images were
processed with CAD.
IMPRESSION: No mammographic evidence of malignancy. A result letter of this
screening mammogram will be mailed directly to the patient.

RECOMMENDATION:
Screening mammogram in one year. (Code:[id number])

BI-RADS CATEGORY  1: Negative.

## 2020-02-26 ENCOUNTER — Other Ambulatory Visit (HOSPITAL_COMMUNITY): Payer: Medicare HMO

## 2020-03-13 ENCOUNTER — Ambulatory Visit (HOSPITAL_COMMUNITY): Payer: Medicare HMO | Attending: Cardiology

## 2020-03-13 ENCOUNTER — Other Ambulatory Visit: Payer: Self-pay

## 2020-03-13 DIAGNOSIS — I350 Nonrheumatic aortic (valve) stenosis: Secondary | ICD-10-CM | POA: Diagnosis not present

## 2020-03-13 LAB — ECHOCARDIOGRAM COMPLETE
AR max vel: 0.95 cm2
AV Area VTI: 1.09 cm2
AV Area mean vel: 0.82 cm2
AV Mean grad: 31 mmHg
AV Peak grad: 48.4 mmHg
Ao pk vel: 3.48 m/s
Area-P 1/2: 2.73 cm2
P 1/2 time: 1014 msec
S' Lateral: 1.7 cm

## 2020-03-14 ENCOUNTER — Telehealth: Payer: Self-pay | Admitting: *Deleted

## 2020-03-14 DIAGNOSIS — I350 Nonrheumatic aortic (valve) stenosis: Secondary | ICD-10-CM

## 2020-03-14 NOTE — Telephone Encounter (Signed)
Pt has been notified of echo results by phone with verbal understanding. Pt agreeable to repeat echo in 1 yr. Order placed. Patient notified of result.  Please refer to phone note from today for complete details.   Julaine Hua, [facility] 03/14/2020 12:43 PM

## 2020-03-14 NOTE — Telephone Encounter (Signed)
-----   Message from Josue Hector, MD sent at 03/14/2020  7:53 AM EDT ----- AS moderate still EF normal f/u echo in a year

## 2020-04-22 NOTE — Progress Notes (Signed)
Telehealth Visit     Date:  04/22/2020   ID:  [name], [place] [birth year], MRN [id number]  Provider location:   Home  PCP:  Chesley Noon, MD  Cardiologist:  Jenkins Rouge, MD  Electrophysiologist:  None    History of Present Illness:     64 y.o. history of HTN, HLD, DM2 last seen by me in 2019.  She has a history of murmur with moderate AS. TTE done 03/13/20 mean gradient 31 mmHg peak 48 mmHg and mild AR. 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 Allergy   . Anemia   . Anxiety   . Aortic stenosis    moderate AS (01/11/18 echo)  . Arthritis   . Asthma   . BMI 40.0-44.9, adult ([place]) 04/07/2014  . DM type 2 (diabetes mellitus, type 2) ([place])   . Dyspnea   . GERD (gastroesophageal reflux disease)   . Heart murmur   . HTN (hypertension)   . Hypercholesterolemia   . Osteoarthritis of left hip 04/07/2014  . Reflux    Past Surgical History:  Procedure Laterality Date  . CESAREAN SECTION     x3  . COLONOSCOPY    . TUBAL LIGATION       No outpatient medications have been marked as taking for the 04/25/20 encounter (Appointment) with Josue Hector, MD.     Allergies:   Atorvastatin and Sulfa antibiotics   Social History   Tobacco Use  . Smoking status: Never Smoker  . Smokeless tobacco: Never Used  Vaping Use  . Vaping Use: Never used  Substance Use Topics  . Alcohol use: No    Alcohol/week: 0.0 standard drinks  . Drug use: No     Family Hx: The patient's family history includes Heart disease in her father; Hypertension in her mother; Multiple sclerosis in her brother and sister; Stroke in her brother and mother. There is no history of Colon cancer, Colon polyps, Esophageal cancer, Rectal cancer, or Stomach cancer.  ROS:   Please see the  history of present illness.   All other systems reviewed are negative.    Objective:    Vital Signs:  There were no vitals taken for this visit.   Wt Readings from Last 3 Encounters:  09/03/19 93 kg  03/21/19 93.9 kg  03/06/19 91.6 kg    Affect appropriate Healthy:  appears stated age 23: normal Neck supple with no adenopathy JVP normal no bruits no thyromegaly Lungs clear with no wheezing and good diaphragmatic motion Heart:  S1/S2 loud mid peaking AS  murmur, no rub, gallop or click PMI normal Abdomen: benighn, BS positve, no tenderness, no AAA no bruit.  No HSM or HJR Distal pulses intact with no bruits No edema Neuro non-focal Skin warm and dry No muscular weakness  ECG:  SR rate 80 nonspecific ST changes    Labs/Other Tests and Data Reviewed:    Lab Results  Component Value Date   WBC 10.1 02/01/2018   HGB 10.2 (L) 02/01/2018   HCT 33.4 (L) 02/01/2018   PLT 324 02/01/2018   GLUCOSE 129 (H) 01/25/2018   CHOL 247 (H) 09/30/2017   TRIG 179 (H) 09/30/2017   HDL 50 09/30/2017   LDLCALC 161 (  H) 09/30/2017   ALT 22 08/19/2017   AST 20 08/19/2017   NA 142 01/25/2018   K 4.0 01/25/2018   CL 105 01/25/2018   CREATININE 1.05 (H) 02/01/2018   BUN 23 (H) 01/25/2018   CO2 27 01/25/2018   TSH 1.430 09/30/2017   HGBA1C 7.0 (H) 01/25/2018   MICROALBUR 36.6 04/08/2015     BNP (last 3 results) No results for input(s): BNP in the last 8760 hours.  ProBNP (last 3 results) No results for input(s): PROBNP in the last 8760 hours.    Prior CV studies:    The following studies were reviewed today:  Myoview Study Highlights 02/2019   Lexiscan stress with no EKG changes to suggest ischemia  Normal perfusion No ischemia  LVEF 60%  This is a low risk study.    Echocardiogram 03/13/20 see HPI   ASSESSMENT & PLAN:    1. HTN.  Well controlled.  Continue current medications and low sodium Dash type diet.    2. HLD:  On statin labs with primary   3.  AS:  Moderate f/u echo October 2022   Medication Adjustments/Labs and Tests Ordered: Current medicines are reviewed at length with the patient today.  Concerns regarding medicines are outlined above.   Tests Ordered:  Echo October 2022 AS  Medication Changes: No orders of the defined types were placed in this encounter.   Disposition:  FU with me in a year      Patient is agreeable to this plan and will call if any problems develop in the interim.   Signed, Jenkins Rouge, MD  04/22/2020 4:52 PM    [place]

## 2020-04-25 ENCOUNTER — Other Ambulatory Visit: Payer: Self-pay

## 2020-04-25 ENCOUNTER — Ambulatory Visit: Payer: Medicare HMO | Admitting: Cardiovascular Disease

## 2020-04-25 VITALS — BP 142/74 | HR 80 | Ht <= 58 in | Wt 212.0 lb

## 2020-04-25 DIAGNOSIS — I350 Nonrheumatic aortic (valve) stenosis: Secondary | ICD-10-CM | POA: Diagnosis not present

## 2020-04-25 NOTE — Patient Instructions (Signed)
Medication Instructions:  *If you need a refill on your cardiac medications before your next appointment, please call your pharmacy*  Lab Work: If you have labs (blood work) drawn today and your tests are completely normal, you will receive your results only by:  [place] (if you have MyChart) OR  A paper copy in the mail If you have any lab test that is abnormal or we need to change your treatment, we will call you to review the results.  Testing/Procedures: None ordered today.   Follow-Up: At [facility], you and your health needs are our priority.  As part of our continuing mission to provide you with exceptional heart care, we have created designated Provider Care Teams.  These Care Teams include your primary Cardiologist (physician) and Advanced Practice Providers (APPs -  Physician Assistants and Nurse Practitioners) who all work together to provide you with the care you need, when you need it.  We recommend signing up for the patient portal called "MyChart".  Sign up information is provided on this After Visit Summary.  MyChart is used to connect with patients for Virtual Visits (Telemedicine).  Patients are able to view lab/test results, encounter notes, upcoming appointments, etc.  Non-urgent messages can be sent to your provider as well.   To learn more about what you can do with MyChart, go to [email or web address].    Your next appointment:   October  The format for your next appointment:   In Person  Provider:   You may see Jenkins Rouge, MD or one of the following Advanced Practice Providers on your designated Care Team:    Truitt Merle, NP  Cecilie Kicks, NP  Kathyrn Drown, NP

## 2020-04-28 NOTE — Addendum Note (Signed)
Addended by: Claude Manges on: 04/28/2020 07:52 AM   Modules accepted: Orders

## 2020-05-30 MED FILL — FERREX 150 CAPSULE: 150 | 80 days supply | Qty: 160 | Fill #1

## 2020-07-28 ENCOUNTER — Other Ambulatory Visit: Payer: Self-pay | Admitting: Family Medicine

## 2020-07-28 DIAGNOSIS — Z1231 Encounter for screening mammogram for malignant neoplasm of breast: Secondary | ICD-10-CM

## 2020-08-04 ENCOUNTER — Other Ambulatory Visit (HOSPITAL_COMMUNITY): Payer: Self-pay | Admitting: Family Medicine

## 2020-08-18 ENCOUNTER — Other Ambulatory Visit (HOSPITAL_COMMUNITY): Payer: Self-pay | Admitting: Family Medicine

## 2020-08-18 MED FILL — GABAPENTIN 300 MG CAPSULE: 300 | 30 days supply | Qty: 90 | Fill #0

## 2020-09-18 ENCOUNTER — Ambulatory Visit: Payer: Medicare HMO

## 2020-10-18 ENCOUNTER — Ambulatory Visit (HOSPITAL_COMMUNITY)
Admission: EM | Admit: 2020-10-18 | Discharge: 2020-10-18 | Disposition: A | Discharge status: Home / Self Care | Payer: Medicare HMO | Attending: Emergency Medicine | Admitting: Emergency Medicine

## 2020-10-18 ENCOUNTER — Encounter (HOSPITAL_COMMUNITY): Payer: Self-pay

## 2020-10-18 DIAGNOSIS — M25552 Pain in left hip: Secondary | ICD-10-CM

## 2020-10-18 MED ORDER — PREDNISONE 10 MG PO TABS: 20.0000 mg | ORAL_TABLET | Freq: Every day | ORAL | 0 refills | Status: AC

## 2020-10-18 NOTE — ED Provider Notes (Signed)
[place]    CSN: [id number] Arrival date & time: 10/18/20  1601      History   Chief Complaint Chief Complaint  Patient presents with  . Hip Pain    HPI [name] is a 65 y.o. female.   Patient here for evaluation of left hip pain that started on Wednesday.  Reports taking Tylenol with minimal relief.  Reports pain as constant and aching and worse with movement.  States pain now radiates down thigh and up into back a little bit.  Denies any dysuria, urgency, or frequency.  Patient does have a history of osteoarthritis in that hip.  Takes Celebrex daily for pain.  Denies any trauma, injury, or other precipitating event.  Denies any fevers, chest pain, shortness of breath, N/V/D, numbness, tingling, weakness, abdominal pain, or headaches.    The history is provided by the patient.  Hip Pain    Past Medical History:  Diagnosis Date  . Allergy   . Anemia   . Anxiety   . Aortic stenosis    moderate AS (01/11/18 echo)  . Arthritis   . Asthma   . BMI 40.0-44.9, adult ([place]) 04/07/2014  . DM type 2 (diabetes mellitus, type 2) ([place])   . Dyspnea   . GERD (gastroesophageal reflux disease)   . Heart murmur   . HTN (hypertension)   . Hypercholesterolemia   . Osteoarthritis of left hip 04/07/2014  . Reflux     Patient Active Problem List   Diagnosis Date Noted  . Spondylolisthesis of lumbar region 02/01/2018  . Osteoarthritis of left hip 04/07/2014  . BMI 40.0-44.9, adult ([place]) 04/07/2014  . DM type 2 (diabetes mellitus, type 2) ([place])   . HTN (hypertension)   . Hypercholesterolemia   . Reflux     Past Surgical History:  Procedure Laterality Date  . CESAREAN SECTION     x3  . COLONOSCOPY    . TUBAL LIGATION      OB History   No obstetric history on file.      Home Medications    Prior to Admission medications   Medication Sig Start Date End Date Taking? Authorizing Provider  predniSONE (DELTASONE) 10 MG tablet Take 2 tablets (20 mg  total) by mouth daily for 5 days. 10/18/20 10/23/20 Yes Pearson Forster, NP  albuterol (PROAIR HFA) 108 (90 BASE) MCG/ACT inhaler Inhale 2 puffs into the lungs every 6 (six) hours as needed. Patient taking differently: Inhale 2 puffs into the lungs every 6 (six) hours as needed for wheezing or shortness of breath.  07/21/13   Roselee Culver, MD  albuterol (VENTOLIN HFA) 108 (90 Base) MCG/ACT inhaler INHALE TWO PUFFS INTO THE LUNGS EVERY 6 (SIX) HOURS AS NEEDED FOR WHEEZING. 08/04/20 08/04/21  Chesley Noon, MD  amLODipine (NORVASC) 10 MG tablet Take 1 tablet (10 mg total) by mouth daily. 11/23/17   [name], PA-C  celecoxib (CELEBREX) 200 MG capsule Take 200 mg by mouth 2 (two) times daily.  10/21/17   [provider]  cetirizine (ZYRTEC) 10 MG tablet Take 1 tablet (10 mg total) by mouth daily. 05/06/17   [name], PA-C  fenofibrate 54 MG tablet Take 1 tablet (54 mg total) by mouth daily. 11/23/17   [name], PA-C  ferrous sulfate 325 (65 FE) MG EC tablet Take 325 mg by mouth daily. 12/04/18   [provider]  gabapentin (NEURONTIN) 300 MG capsule Take 300 mg by mouth 3 (three) times daily.  10/21/17   [provider]  gabapentin (NEURONTIN) 300 MG capsule TAKE 1 CAPSULE BY MOUTH THREE TIMES DAILY. 08/18/20 08/18/21  Chesley Noon, MD  iron polysaccharides (NIFEREX) 150 MG capsule Take 1 capsule (150 mg total) by mouth daily. 11/23/17   [name], PA-C  iron polysaccharides (NIFEREX) 150 MG capsule TAKE 1 CAPSULE (150 MG DOSE) BY MOUTH TWICE A DAY 12/19/19 12/18/20  Tomasa Hose, NP  lansoprazole (PREVACID) 30 MG capsule Take 1 capsule (30 mg total) by mouth daily. 11/23/17   [name], PA-C  losartan-hydrochlorothiazide (HYZAAR) 100-25 MG tablet Take 1 tablet by mouth daily. 11/23/17   [name], PA-C  metFORMIN (GLUCOPHAGE) 1000 MG tablet Take 1 tablet (1,000 mg total) by mouth 2 (two) times daily with a meal. 11/23/17   [name], PA-C  metFORMIN (GLUCOPHAGE)  1000 MG tablet TAKE 1 TABLET BY MOUTH TWICE DAILY WITH A MEAL 08/04/20 08/04/21  Chesley Noon, MD  metoprolol succinate (TOPROL-XL) 100 MG 24 hr tablet Take 1 tablet (100 mg total) by mouth daily. Take with or immediately following a meal. 11/23/17   [name], PA-C    Family History Family History  Problem Relation Age of Onset  . Hypertension Mother   . Stroke Mother   . Heart disease Father   . Stroke Brother   . Multiple sclerosis Brother   . Multiple sclerosis Sister   . Colon cancer Neg Hx   . Colon polyps Neg Hx   . Esophageal cancer Neg Hx   . Rectal cancer Neg Hx   . Stomach cancer Neg Hx     Social History Social History   Tobacco Use  . Smoking status: Never Smoker  . Smokeless tobacco: Never Used  Vaping Use  . Vaping Use: Never used  Substance Use Topics  . Alcohol use: No    Alcohol/week: 0.0 standard drinks  . Drug use: No     Allergies   Atorvastatin and Sulfa antibiotics   Review of Systems Review of Systems  Musculoskeletal: Positive for arthralgias and joint swelling.  All other systems reviewed and are negative.    Physical Exam Triage Vital Signs ED Triage Vitals  Enc Vitals Group     BP 10/18/20 1635 134/72     Pulse Rate 10/18/20 1635 86     Resp 10/18/20 1635 16     Temp 10/18/20 1635 98.8 F (37.1 C)     Temp Source 10/18/20 1635 Oral     SpO2 10/18/20 1635 100 %     Weight --      Height --      Head Circumference --      Peak Flow --      Pain Score 10/18/20 1634 10     Pain Loc --      Pain Edu? --      Excl. in [place]? --    No data found.  Updated Vital Signs BP 134/72 (BP Location: Right Arm)   Pulse 86   Temp 98.8 F (37.1 C) (Oral)   Resp 16   SpO2 100%   Visual Acuity Right Eye Distance:   Left Eye Distance:   Bilateral Distance:    Right Eye Near:   Left Eye Near:    Bilateral Near:     Physical Exam Vitals and nursing note reviewed.  Constitutional:      General: She is not in acute distress.     Appearance: Normal appearance. She is not ill-appearing,  toxic-appearing or diaphoretic.  HENT:     Head: Normocephalic and atraumatic.  Eyes:     Conjunctiva/sclera: Conjunctivae normal.  Cardiovascular:     Rate and Rhythm: Normal rate.     Pulses: Normal pulses.  Pulmonary:     Effort: Pulmonary effort is normal.  Abdominal:     General: Abdomen is flat.  Musculoskeletal:        General: Normal range of motion.     Cervical back: Normal and normal range of motion.     Thoracic back: Normal.     Lumbar back: Normal. No tenderness. Normal range of motion. Negative right straight leg raise test and negative left straight leg raise test.     Right hip: Normal.     Left hip: No deformity, lacerations, tenderness or bony tenderness. Normal range of motion. Normal strength.  Skin:    General: Skin is warm and dry.  Neurological:     General: No focal deficit present.     Mental Status: She is alert and oriented to person, place, and time.  Psychiatric:        Mood and Affect: Mood normal.      UC Treatments / Results  Labs (all labs ordered are listed, but only abnormal results are displayed) Labs Reviewed - No data to display  EKG   Radiology No results found.  Procedures Procedures (including critical care time)  Medications Ordered in UC Medications - No data to display  Initial Impression / Assessment and Plan / UC Course  I have reviewed the triage vital signs and the nursing notes.  Pertinent labs & imaging results that were available during my care of the patient were reviewed by me and considered in my medical decision making (see chart for details).    Assessment negative for red flags or concerns.  Prednisone burst for inflammation.  Continue taking Celebrex as prescribed.  Encouraged patient to rest as much as possible for the next few days.  Use heat, ice, or alternate heat and ice for comfort.  Recommend follow up with orthopedics if symptoms do not  improve in the next week.  Final Clinical Impressions(s) / UC Diagnoses   Final diagnoses:  Left hip pain     Discharge Instructions     Take the prednisone once a day for the next 5 days.   Rest as much as possible Ice for 10-15 minutes every 4-6 hours as needed for pain and swelling.  You can also use heat or alternate between heat and ice.   Elevate above your hip/heart when sitting and laying down  Follow up with sports medicine or orthopedics if symptoms do not improve in the next week.      ED Prescriptions    Medication Sig Dispense Auth. Provider   predniSONE (DELTASONE) 10 MG tablet Take 2 tablets (20 mg total) by mouth daily for 5 days. 10 tablet Pearson Forster, NP     PDMP not reviewed this encounter.   Pearson Forster, NP 10/18/20 [phone]

## 2020-10-18 NOTE — Discharge Instructions (Signed)
Take the prednisone once a day for the next 5 days.   Rest as much as possible Ice for 10-15 minutes every 4-6 hours as needed for pain and swelling.  You can also use heat or alternate between heat and ice.   Elevate above your hip/heart when sitting and laying down  Follow up with sports medicine or orthopedics if symptoms do not improve in the next week.

## 2020-10-18 NOTE — ED Triage Notes (Signed)
Pt in with c/o left hip pain that radiates to her knee and across her lower back that started on Wednesday  Pt has been taking tylenol for relief

## 2020-10-21 ENCOUNTER — Other Ambulatory Visit: Payer: Self-pay

## 2020-10-21 ENCOUNTER — Encounter (HOSPITAL_COMMUNITY): Payer: Self-pay | Admitting: Emergency Medicine

## 2020-10-21 ENCOUNTER — Emergency Department (HOSPITAL_COMMUNITY)
Admission: EM | Admit: 2020-10-21 | Discharge: 2020-10-21 | Disposition: A | Discharge status: Home / Self Care | Payer: Medicare HMO | Attending: Emergency Medicine | Admitting: Emergency Medicine

## 2020-10-21 DIAGNOSIS — G8929 Other chronic pain: Secondary | ICD-10-CM | POA: Diagnosis not present

## 2020-10-21 DIAGNOSIS — E119 Type 2 diabetes mellitus without complications: Secondary | ICD-10-CM | POA: Insufficient documentation

## 2020-10-21 DIAGNOSIS — M25552 Pain in left hip: Secondary | ICD-10-CM | POA: Insufficient documentation

## 2020-10-21 DIAGNOSIS — Z79899 Other long term (current) drug therapy: Secondary | ICD-10-CM | POA: Diagnosis not present

## 2020-10-21 DIAGNOSIS — Z7984 Long term (current) use of oral hypoglycemic drugs: Secondary | ICD-10-CM | POA: Diagnosis not present

## 2020-10-21 DIAGNOSIS — J45909 Unspecified asthma, uncomplicated: Secondary | ICD-10-CM | POA: Diagnosis not present

## 2020-10-21 DIAGNOSIS — I10 Essential (primary) hypertension: Secondary | ICD-10-CM | POA: Diagnosis not present

## 2020-10-21 MED ORDER — ACETAMINOPHEN 500 MG PO TABS
1000.0000 mg | ORAL_TABLET | Freq: Once | ORAL | Status: AC
  Administered 2020-10-21: 1000 mg via ORAL
  Filled 2020-10-21: qty 2

## 2020-10-21 NOTE — ED Notes (Signed)
Pt verbalizes understanding of d/c instructions. Pt ambulatory at d/c with all belongings and with family.   

## 2020-10-21 NOTE — ED Provider Notes (Signed)
Emergency Medicine Provider Triage Evaluation Note  [name] , a 65 y.o. female  was evaluated in triage.  Pt complains of acute on chronic left hip/thigh pain for the past 1 week. Seen at [facility] and is currently on prednisone without relief prompting ED visit. No recent falls.  Review of Systems  Positive: + left leg pain Negative: - chest pain, SOB  Physical Exam  BP (!) 153/80 (BP Location: Right Arm)   Pulse 73   Temp 98.2 F (36.8 C) (Oral)   Resp 15   SpO2 96%  Gen:   Awake, no distress  Resp:  Normal effort  MSK:   Moves extremities without difficulty  Other:  Mild TTP to left hip/thigh; ROM intact; able to ambulate with cane  Medical Decision Making  Medically screening exam initiated at 6:03 PM.  Appropriate orders placed.  [place] was informed that the remainder of the evaluation will be completed by another provider, this initial triage assessment does not replace that evaluation, and the importance of remaining in the ED until their evaluation is complete.     Eustaquio Maize, PA-C 10/21/20 1804    Daleen Bo, MD 10/21/20 [phone]

## 2020-10-21 NOTE — Discharge Instructions (Signed)
You may use over-the-counter Motrin (Ibuprofen), Acetaminophen (Tylenol), topical muscle creams such as SalonPas, Icy Hot, Bengay, etc. Please stretch, apply ice or heat (whichever helps), and have massage therapy for additional assistance.  

## 2020-10-21 NOTE — ED Triage Notes (Signed)
Pt c/o left hip pain that radiates to her thigh x 1 week. Denies injury/fall.

## 2020-10-21 NOTE — ED Provider Notes (Signed)
[facility] EMERGENCY DEPARTMENT Provider Note  CSN: [id number] Arrival date & time: 10/21/20 1547  Chief Complaint(s) Leg Pain  HPI [name] is a 65 y.o. female 1 week of increased pain to left hip from baseline chronic pain related to her OA. No trauma or falls. Worse with walking. Better being off of it. Pain radiates to the anterior thigh.   HPI  Past Medical History Past Medical History:  Diagnosis Date  . Allergy   . Anemia   . Anxiety   . Aortic stenosis    moderate AS (01/11/18 echo)  . Arthritis   . Asthma   . BMI 40.0-44.9, adult ([place]) 04/07/2014  . DM type 2 (diabetes mellitus, type 2) ([place])   . Dyspnea   . GERD (gastroesophageal reflux disease)   . Heart murmur   . HTN (hypertension)   . Hypercholesterolemia   . Osteoarthritis of left hip 04/07/2014  . Reflux    Patient Active Problem List   Diagnosis Date Noted  . Spondylolisthesis of lumbar region 02/01/2018  . Osteoarthritis of left hip 04/07/2014  . BMI 40.0-44.9, adult ([place]) 04/07/2014  . DM type 2 (diabetes mellitus, type 2) ([place])   . HTN (hypertension)   . Hypercholesterolemia   . Reflux    Home Medication(s) Prior to Admission medications   Medication Sig Start Date End Date Taking? Authorizing Provider  albuterol (PROAIR HFA) 108 (90 BASE) MCG/ACT inhaler Inhale 2 puffs into the lungs every 6 (six) hours as needed. Patient taking differently: Inhale 2 puffs into the lungs every 6 (six) hours as needed for wheezing or shortness of breath.  07/21/13   Roselee Culver, MD  albuterol (VENTOLIN HFA) 108 (90 Base) MCG/ACT inhaler INHALE TWO PUFFS INTO THE LUNGS EVERY 6 (SIX) HOURS AS NEEDED FOR WHEEZING. 08/04/20 08/04/21  Chesley Noon, MD  amLODipine (NORVASC) 10 MG tablet Take 1 tablet (10 mg total) by mouth daily. 11/23/17   [name], PA-C  celecoxib (CELEBREX) 200 MG capsule Take 200 mg by mouth 2 (two) times daily.  10/21/17   [provider]  cetirizine  (ZYRTEC) 10 MG tablet Take 1 tablet (10 mg total) by mouth daily. 05/06/17   [name], PA-C  fenofibrate 54 MG tablet Take 1 tablet (54 mg total) by mouth daily. 11/23/17   [name], PA-C  ferrous sulfate 325 (65 FE) MG EC tablet Take 325 mg by mouth daily. 12/04/18   [provider]  gabapentin (NEURONTIN) 300 MG capsule Take 300 mg by mouth 3 (three) times daily.  10/21/17   [provider]  gabapentin (NEURONTIN) 300 MG capsule TAKE 1 CAPSULE BY MOUTH THREE TIMES DAILY. 08/18/20 08/18/21  Chesley Noon, MD  iron polysaccharides (NIFEREX) 150 MG capsule Take 1 capsule (150 mg total) by mouth daily. 11/23/17   [name], PA-C  iron polysaccharides (NIFEREX) 150 MG capsule TAKE 1 CAPSULE (150 MG DOSE) BY MOUTH TWICE A DAY 12/19/19 12/18/20  Tomasa Hose, NP  lansoprazole (PREVACID) 30 MG capsule Take 1 capsule (30 mg total) by mouth daily. 11/23/17   [name], PA-C  losartan-hydrochlorothiazide (HYZAAR) 100-25 MG tablet Take 1 tablet by mouth daily. 11/23/17   [name], PA-C  metFORMIN (GLUCOPHAGE) 1000 MG tablet Take 1 tablet (1,000 mg total) by mouth 2 (two) times daily with a meal. 11/23/17   [name], PA-C  metFORMIN (GLUCOPHAGE) 1000 MG tablet TAKE 1 TABLET BY MOUTH TWICE DAILY WITH A MEAL 08/04/20 08/04/21  Chesley Noon, MD  metoprolol succinate (TOPROL-XL) 100 MG 24 hr tablet Take 1 tablet (100 mg total) by mouth daily. Take with or immediately following a meal. 11/23/17   [name], PA-C  predniSONE (DELTASONE) 10 MG tablet Take 2 tablets (20 mg total) by mouth daily for 5 days. 10/18/20 10/23/20  Pearson Forster, NP                                                                                                                                    Past Surgical History Past Surgical History:  Procedure Laterality Date  . CESAREAN SECTION     x3  . COLONOSCOPY    . TUBAL LIGATION     Family History Family History  Problem Relation Age of Onset  .  Hypertension Mother   . Stroke Mother   . Heart disease Father   . Stroke Brother   . Multiple sclerosis Brother   . Multiple sclerosis Sister   . Colon cancer Neg Hx   . Colon polyps Neg Hx   . Esophageal cancer Neg Hx   . Rectal cancer Neg Hx   . Stomach cancer Neg Hx     Social History Social History   Tobacco Use  . Smoking status: Never Smoker  . Smokeless tobacco: Never Used  Vaping Use  . Vaping Use: Never used  Substance Use Topics  . Alcohol use: No    Alcohol/week: 0.0 standard drinks  . Drug use: No   Allergies Atorvastatin and Sulfa antibiotics  Review of Systems Review of Systems All other systems are reviewed and are negative for acute change except as noted in the HPI  Physical Exam Vital Signs  I have reviewed the triage vital signs BP (!) 150/76 (BP Location: Right Arm)   Pulse 77   Temp 98.2 F (36.8 C) (Oral)   Resp 16   SpO2 96%   Physical Exam Vitals reviewed.  Constitutional:      General: She is not in acute distress.    Appearance: She is well-developed. She is not diaphoretic.  HENT:     Head: Normocephalic and atraumatic.     Right Ear: External ear normal.     Left Ear: External ear normal.     Nose: Nose normal.  Eyes:     General: No scleral icterus.    Conjunctiva/sclera: Conjunctivae normal.  Neck:     Trachea: Phonation normal.  Cardiovascular:     Rate and Rhythm: Normal rate and regular rhythm.  Pulmonary:     Effort: Pulmonary effort is normal. No respiratory distress.     Breath sounds: No stridor.  Abdominal:     General: There is no distension.  Musculoskeletal:        General: Normal range of motion.     Cervical back: Normal range of motion.     Lumbar back: No tenderness or bony tenderness.  Back:     Left hip: Tenderness present. No deformity or bony tenderness. Normal range of motion.       Legs:  Neurological:     Mental Status: She is alert and oriented to person, place, and time.   Psychiatric:        Behavior: Behavior normal.     ED Results and Treatments Labs (all labs ordered are listed, but only abnormal results are displayed) Labs Reviewed - No data to display                                                                                                                       EKG  EKG Interpretation  Date/Time:    Ventricular Rate:    PR Interval:    QRS Duration:   QT Interval:    QTC Calculation:   R Axis:     Text Interpretation:        Radiology No results found.  Pertinent labs & imaging results that were available during my care of the patient were reviewed by me and considered in my medical decision making (see chart for details).  Medications Ordered in ED Medications  acetaminophen (TYLENOL) tablet 1,000 mg (has no administration in time range)                                                                                                                                    Procedures Procedures  (including critical care time)  Medical Decision Making / ED Course I have reviewed the nursing notes for this encounter and the patient's prior records (if available in EHR or on provided paperwork).   [name] was evaluated in Emergency Department on 10/21/2020 for the symptoms described in the history of present illness. She was evaluated in the context of the global COVID-19 pandemic, which necessitated consideration that the patient might be at risk for infection with the SARS-CoV-2 virus that causes COVID-19. Institutional protocols and algorithms that pertain to the evaluation of patients at risk for COVID-19 are in a state of rapid change based on information released by regulatory bodies including the CDC and federal and state organizations. These policies and algorithms were followed during the patient's care in the ED.  No trauma concerning for fracture/dislocation. No swelling or edema concerning for DVT. More consistent  with her OA.  Possible radiculopathy vs impinged nerve vs muscle strain. Additional supportive management  recommended.      Final Clinical Impression(s) / ED Diagnoses Final diagnoses:  Chronic left hip pain    The patient appears reasonably screened and/or stabilized for discharge and I doubt any other medical condition or other [facility] requiring further screening, evaluation, or treatment in the ED at this time prior to discharge. Safe for discharge with strict return precautions.  Disposition: Discharge  Condition: Good  I have discussed the results, Dx and Tx plan with the patient/family who expressed understanding and agree(s) with the plan. Discharge instructions discussed at length. The patient/family was given strict return precautions who verbalized understanding of the instructions. No further questions at time of discharge.    ED Discharge Orders    None      Follow Up: [place] Noon, MD [place] [id number] [phone]  Call  to schedule an appointment for close follow up     This chart was dictated using voice recognition software.  Despite best efforts to proofread,  errors can occur which can change the documentation meaning.   Fatima Blank, MD 10/21/20 2329

## 2020-10-29 ENCOUNTER — Other Ambulatory Visit (HOSPITAL_COMMUNITY): Payer: Self-pay

## 2020-10-29 MED ORDER — METHOCARBAMOL 500 MG PO TABS
500.0000 mg | ORAL_TABLET | Freq: Three times a day (TID) | ORAL | 1 refills | Status: DC
  Filled 2020-10-29: qty 30, 10d supply, fill #0

## 2020-10-29 MED ORDER — PREDNISONE 20 MG PO TABS
ORAL_TABLET | ORAL | 0 refills | Status: DC
  Filled 2020-10-29: qty 15, 10d supply, fill #0

## 2020-11-05 ENCOUNTER — Other Ambulatory Visit: Payer: Self-pay

## 2020-11-05 ENCOUNTER — Ambulatory Visit
Admission: RE | Admit: 2020-11-05 | Discharge: 2020-11-05 | Disposition: A | Discharge status: Home / Self Care | Payer: Medicare HMO | Source: Ambulatory Visit | Attending: Family Medicine | Admitting: Family Medicine

## 2020-11-05 DIAGNOSIS — Z1231 Encounter for screening mammogram for malignant neoplasm of breast: Secondary | ICD-10-CM

## 2021-01-07 ENCOUNTER — Encounter (HOSPITAL_COMMUNITY): Payer: Self-pay | Admitting: Cardiovascular Disease

## 2021-02-06 ENCOUNTER — Ambulatory Visit (HOSPITAL_COMMUNITY): Payer: Medicare HMO | Attending: Cardiology

## 2021-02-06 ENCOUNTER — Other Ambulatory Visit: Payer: Self-pay

## 2021-02-06 DIAGNOSIS — I350 Nonrheumatic aortic (valve) stenosis: Secondary | ICD-10-CM | POA: Insufficient documentation

## 2021-02-07 LAB — ECHOCARDIOGRAM COMPLETE
AR max vel: 1.03 cm2
AV Area VTI: 1.14 cm2
AV Area mean vel: 1.03 cm2
AV Mean grad: 19.7 mmHg
AV Peak grad: 37 mmHg
Ao pk vel: 3.04 m/s
Area-P 1/2: 3.12 cm2
P 1/2 time: 1001 msec
S' Lateral: 1.7 cm

## 2021-02-12 ENCOUNTER — Telehealth: Payer: Self-pay | Admitting: Cardiovascular Disease

## 2021-02-12 NOTE — Telephone Encounter (Signed)
Per Dr. Johnsie Cancel, EF normal moderate AS mild AR stable f/u echo in a year. Patient verbalized understanding.

## 2021-02-12 NOTE — Telephone Encounter (Signed)
° °  Pt is returning call to get echo result °

## 2021-02-25 ENCOUNTER — Other Ambulatory Visit (HOSPITAL_COMMUNITY): Payer: Self-pay

## 2021-02-25 MED ORDER — METOPROLOL SUCCINATE ER 100 MG PO TB24
100.0000 mg | ORAL_TABLET | Freq: Every day | ORAL | 1 refills | Status: DC
  Filled 2021-02-25: qty 90, 90d supply, fill #0

## 2021-02-25 MED ORDER — METHOCARBAMOL 500 MG PO TABS
500.0000 mg | ORAL_TABLET | Freq: Three times a day (TID) | ORAL | 1 refills | Status: DC
  Filled 2021-02-25: qty 30, 10d supply, fill #0

## 2021-02-26 ENCOUNTER — Other Ambulatory Visit (HOSPITAL_COMMUNITY): Payer: Self-pay

## 2021-03-24 HISTORY — PX: LAMINECTOMY: SHX219

## 2021-04-21 ENCOUNTER — Other Ambulatory Visit (HOSPITAL_COMMUNITY): Payer: Self-pay

## 2021-04-21 DIAGNOSIS — M5416 Radiculopathy, lumbar region: Secondary | ICD-10-CM | POA: Insufficient documentation

## 2021-04-21 MED ORDER — TIZANIDINE HCL 4 MG PO TABS
4.0000 mg | ORAL_TABLET | Freq: Four times a day (QID) | ORAL | 0 refills | Status: DC | PRN
  Filled 2021-04-21: qty 120, 30d supply, fill #0

## 2021-04-21 MED ORDER — OXYCODONE-ACETAMINOPHEN 5-325 MG PO TABS
1.0000 | ORAL_TABLET | Freq: Four times a day (QID) | ORAL | 0 refills | Status: DC | PRN
  Filled 2021-04-21: qty 28, 7d supply, fill #0

## 2021-04-21 MED ORDER — CEPHALEXIN 500 MG PO CAPS
500.0000 mg | ORAL_CAPSULE | Freq: Three times a day (TID) | ORAL | 0 refills | Status: DC
  Filled 2021-04-21: qty 15, 5d supply, fill #0

## 2021-05-01 ENCOUNTER — Other Ambulatory Visit (HOSPITAL_COMMUNITY): Payer: Self-pay

## 2021-05-01 MED ORDER — METFORMIN HCL 1000 MG PO TABS
1000.0000 mg | ORAL_TABLET | Freq: Two times a day (BID) | ORAL | 1 refills | Status: AC
  Filled 2021-05-01: qty 180, 90d supply, fill #0

## 2021-05-24 HISTORY — PX: BACK SURGERY: SHX140

## 2021-06-04 ENCOUNTER — Other Ambulatory Visit (HOSPITAL_COMMUNITY): Payer: Self-pay

## 2021-06-04 MED ORDER — OZEMPIC (0.25 OR 0.5 MG/DOSE) 2 MG/1.5ML ~~LOC~~ SOPN
0.2500 mg | PEN_INJECTOR | SUBCUTANEOUS | 5 refills | Status: DC
  Filled 2021-06-04: qty 1.5, 28d supply, fill #0

## 2021-06-05 ENCOUNTER — Other Ambulatory Visit (HOSPITAL_COMMUNITY): Payer: Self-pay

## 2021-06-05 MED ORDER — OZEMPIC (0.25 OR 0.5 MG/DOSE) 2 MG/1.5ML ~~LOC~~ SOPN
0.2500 mg | PEN_INJECTOR | SUBCUTANEOUS | 5 refills | Status: DC
  Filled 2021-06-05 (×2): qty 1.5, 56d supply, fill #0

## 2021-06-08 ENCOUNTER — Other Ambulatory Visit (HOSPITAL_COMMUNITY): Payer: Self-pay

## 2021-06-08 MED ORDER — OZEMPIC (0.25 OR 0.5 MG/DOSE) 2 MG/1.5ML ~~LOC~~ SOPN
0.2500 mg | PEN_INJECTOR | SUBCUTANEOUS | 5 refills | Status: DC
  Filled 2021-06-08: qty 1.5, 56d supply, fill #0

## 2021-06-16 ENCOUNTER — Other Ambulatory Visit (HOSPITAL_COMMUNITY): Payer: Self-pay

## 2021-08-15 ENCOUNTER — Other Ambulatory Visit (HOSPITAL_COMMUNITY): Payer: Self-pay

## 2021-08-17 ENCOUNTER — Other Ambulatory Visit (HOSPITAL_COMMUNITY): Payer: Self-pay

## 2021-08-17 MED ORDER — TIZANIDINE HCL 4 MG PO TABS
4.0000 mg | ORAL_TABLET | Freq: Four times a day (QID) | ORAL | 0 refills | Status: DC | PRN
  Filled 2021-08-17: qty 120, 30d supply, fill #0

## 2021-11-16 ENCOUNTER — Other Ambulatory Visit (HOSPITAL_COMMUNITY): Payer: Self-pay

## 2021-11-17 ENCOUNTER — Other Ambulatory Visit (HOSPITAL_COMMUNITY): Payer: Self-pay

## 2021-11-17 DIAGNOSIS — Z981 Arthrodesis status: Secondary | ICD-10-CM | POA: Insufficient documentation

## 2021-11-18 ENCOUNTER — Other Ambulatory Visit (HOSPITAL_COMMUNITY): Payer: Self-pay

## 2021-11-23 ENCOUNTER — Other Ambulatory Visit (HOSPITAL_COMMUNITY): Payer: Self-pay

## 2021-11-27 ENCOUNTER — Other Ambulatory Visit (HOSPITAL_COMMUNITY): Payer: Self-pay

## 2021-12-03 ENCOUNTER — Other Ambulatory Visit (HOSPITAL_COMMUNITY): Payer: Self-pay

## 2021-12-07 ENCOUNTER — Other Ambulatory Visit: Payer: Self-pay | Admitting: Family Medicine

## 2021-12-07 DIAGNOSIS — Z1231 Encounter for screening mammogram for malignant neoplasm of breast: Secondary | ICD-10-CM

## 2021-12-08 ENCOUNTER — Other Ambulatory Visit (HOSPITAL_COMMUNITY): Payer: Self-pay

## 2021-12-11 ENCOUNTER — Other Ambulatory Visit (HOSPITAL_COMMUNITY): Payer: Self-pay | Admitting: Neurosurgery

## 2021-12-11 ENCOUNTER — Emergency Department (HOSPITAL_COMMUNITY)
Admission: EM | Admit: 2021-12-11 | Discharge: 2021-12-11 | Discharge status: Against Medical Advice | Payer: Medicare HMO | Attending: Emergency Medicine | Admitting: Emergency Medicine

## 2021-12-11 ENCOUNTER — Other Ambulatory Visit: Payer: Self-pay | Admitting: Neurosurgery

## 2021-12-11 ENCOUNTER — Other Ambulatory Visit: Payer: Self-pay

## 2021-12-11 ENCOUNTER — Encounter (HOSPITAL_COMMUNITY): Payer: Self-pay | Admitting: Emergency Medicine

## 2021-12-11 DIAGNOSIS — M4316 Spondylolisthesis, lumbar region: Secondary | ICD-10-CM

## 2021-12-11 DIAGNOSIS — T391X1A Poisoning by 4-Aminophenol derivatives, accidental (unintentional), initial encounter: Secondary | ICD-10-CM | POA: Insufficient documentation

## 2021-12-11 DIAGNOSIS — Z5321 Procedure and treatment not carried out due to patient leaving prior to being seen by health care provider: Secondary | ICD-10-CM | POA: Diagnosis not present

## 2021-12-11 LAB — COMPREHENSIVE METABOLIC PANEL
ALT: 18 U/L (ref 0–44)
AST: 16 U/L (ref 15–41)
Albumin: 4.1 g/dL (ref 3.5–5.0)
Alkaline Phosphatase: 53 U/L (ref 38–126)
Anion gap: 10 (ref 5–15)
BUN: 29 mg/dL — ABNORMAL HIGH (ref 8–23)
CO2: 28 mmol/L (ref 22–32)
Calcium: 10.1 mg/dL (ref 8.9–10.3)
Chloride: 103 mmol/L (ref 98–111)
Creatinine, Ser: 1.18 mg/dL — ABNORMAL HIGH (ref 0.44–1.00)
GFR, Estimated: 52 mL/min — ABNORMAL LOW (ref >60)
Glucose, Bld: 114 mg/dL — ABNORMAL HIGH (ref 70–99)
Potassium: 3.9 mmol/L (ref 3.5–5.1)
Sodium: 141 mmol/L (ref 135–145)
Total Bilirubin: 0.8 mg/dL (ref 0.3–1.2)
Total Protein: 7.1 g/dL (ref 6.5–8.1)

## 2021-12-11 LAB — ACETAMINOPHEN LEVEL: Acetaminophen (Tylenol), Serum: 38 ug/mL — ABNORMAL HIGH (ref 10–30)

## 2021-12-11 NOTE — ED Triage Notes (Signed)
Pt reported to ED for evaluation of accidentalyl ingesting approximately 10 extra strength tylenol at around 1125 this evening. Pt states she mixed container with her evening pills for container with tylenol in it.

## 2021-12-11 NOTE — ED Provider Triage Note (Signed)
  Emergency Medicine Provider Triage Evaluation Note  MRN:  [id number]  Arrival date & time: 12/11/21    Medically screening exam initiated at 12:48 AM.   CC:   Tylenol Overdose  HPI:  [name] is a 66 y.o. year-old female presents to the ED with chief complaint of Tylenol overdose.  She states that she mistakenly took about (10) 500 mg Tylenols instead of her regular nightly meds.  This was unintentional.  She states that she ingested the Tylenol at about 11:45 PM.  She denies any other acetaminophen ingestion today.  History provided by patient. ROS:  -As included in HPI PE:   Vitals:   12/11/21 0041  BP: (!) 148/69  Pulse: 89  Resp: 16  Temp: (!) 97.5 F (36.4 C)  SpO2: 98%    Non-toxic appearing No respiratory distress  MDM:  Based on signs and symptoms, unintentional Tylenol overdose is highest on my differential. I've ordered labs and coingestants in triage to expedite lab/diagnostic workup.  Patient was informed that the remainder of the evaluation will be completed by another provider, this initial triage assessment does not replace that evaluation, and the importance of remaining in the ED until their evaluation is complete.    [name], PA-C 12/11/21 2159

## 2021-12-11 NOTE — ED Notes (Signed)
Pt LWBS despite being advised not to, dragged OTF.

## 2021-12-17 ENCOUNTER — Ambulatory Visit: Payer: Medicare HMO

## 2021-12-23 ENCOUNTER — Ambulatory Visit (HOSPITAL_COMMUNITY)
Admission: RE | Admit: 2021-12-23 | Discharge: 2021-12-23 | Disposition: A | Discharge status: Home / Self Care | Payer: Medicare HMO | Source: Ambulatory Visit | Attending: Neurosurgery | Admitting: Neurosurgery

## 2021-12-23 ENCOUNTER — Other Ambulatory Visit: Payer: Self-pay

## 2021-12-23 DIAGNOSIS — M4316 Spondylolisthesis, lumbar region: Secondary | ICD-10-CM | POA: Diagnosis present

## 2021-12-23 DIAGNOSIS — M79604 Pain in right leg: Secondary | ICD-10-CM | POA: Insufficient documentation

## 2021-12-23 LAB — GLUCOSE, CAPILLARY: Glucose-Capillary: 121 mg/dL — ABNORMAL HIGH (ref 70–99)

## 2021-12-23 MED ORDER — ONDANSETRON HCL 4 MG/2ML IJ SOLN
4.0000 mg | Freq: Four times a day (QID) | INTRAMUSCULAR | Status: DC | PRN
Start: 2021-12-23 — End: 2021-12-24

## 2021-12-23 MED ORDER — DIAZEPAM 5 MG PO TABS
5.0000 mg | ORAL_TABLET | Freq: Four times a day (QID) | ORAL | Status: DC | PRN
  Filled 2021-12-23: qty 1

## 2021-12-23 MED ORDER — IOHEXOL 180 MG/ML  SOLN
20.0000 mL | Freq: Once | INTRAMUSCULAR | Status: AC | PRN
  Administered 2021-12-23: 20 mL via INTRATHECAL

## 2021-12-23 MED ORDER — OXYCODONE HCL 5 MG PO TABS
5.0000 mg | ORAL_TABLET | ORAL | Status: DC | PRN
  Filled 2021-12-23: qty 2

## 2021-12-23 MED ORDER — LIDOCAINE HCL (PF) 1 % IJ SOLN
5.0000 mL | Freq: Once | INTRAMUSCULAR | Status: AC
  Administered 2021-12-23: 5 mL via INTRADERMAL

## 2021-12-23 MED ORDER — OXYCODONE HCL 5 MG PO TABS
ORAL_TABLET | ORAL | Status: AC
  Administered 2021-12-23: 5 mg via ORAL
  Filled 2021-12-23: qty 1

## 2021-12-23 MED ORDER — DIAZEPAM 5 MG PO TABS
ORAL_TABLET | ORAL | Status: AC
  Administered 2021-12-23: 5 mg via ORAL
  Filled 2021-12-23: qty 1

## 2021-12-23 NOTE — Progress Notes (Signed)
Client transported to radiology; called Almyra Free in radiology and notified no orders found

## 2021-12-23 NOTE — Op Note (Signed)
*   No surgery found * lumbar Myelogram  PATIENT:  [name] is a 66 y.o. female  PRE-OPERATIVE DIAGNOSIS:  lumbago with  With right lower extremity pain POST-OPERATIVE DIAGNOSIS:  lumbago with right lower extremity pain  PROCEDURE:  Lumbar Myelogram  SURGEON:  Dillon Mcreynolds  ANESTHESIA:   local LOCAL MEDICATIONS USED:  LIDOCAINE  Procedure Note: [name] is a 66 y.o. female Was taken to the fluoroscopy suite and  positioned prone on the fluoroscopy table. Her back was prepared and draped in a sterile manner. I infiltrated 5 cc into the lumbar region. I then introduced a spinal needle into the thecal sac at the L4/5 interlaminar space. I infiltrated 20cc of Isovue 180 into the thecal sac. Fluoroscopy showed the needle and contrast in the thecal sac. [name] tolerated the procedure well. she Will be taken to CT for evaluation.     PATIENT DISPOSITION:  Short Stay

## 2021-12-23 NOTE — Progress Notes (Signed)
Called radiology dept and notified no orders; left Dr Christella Noa message to call

## 2021-12-28 ENCOUNTER — Other Ambulatory Visit: Payer: Self-pay | Admitting: Neurosurgery

## 2021-12-31 ENCOUNTER — Ambulatory Visit
Admission: RE | Admit: 2021-12-31 | Discharge: 2021-12-31 | Disposition: A | Discharge status: Home / Self Care | Payer: Medicare HMO | Source: Ambulatory Visit | Attending: Family Medicine | Admitting: Family Medicine

## 2021-12-31 DIAGNOSIS — Z1231 Encounter for screening mammogram for malignant neoplasm of breast: Secondary | ICD-10-CM

## 2022-01-04 ENCOUNTER — Other Ambulatory Visit: Payer: Self-pay | Admitting: Family Medicine

## 2022-01-04 DIAGNOSIS — R928 Other abnormal and inconclusive findings on diagnostic imaging of breast: Secondary | ICD-10-CM

## 2022-01-06 NOTE — Progress Notes (Addendum)
Office Visit    Patient Name: [name] Date of Encounter: 01/07/2022  PCP:  Chesley Noon, MD   [place]  Cardiologist:  Jenkins Rouge, MD  Advanced Practice Provider:  No care team member to display Electrophysiologist:  None  HPI    [name] is a 66 y.o. female with past medical history significant for moderate aortic stenosis, anxiety, asthma, obesity, diabetes mellitus type 2, GERD, hypertension, hyperlipidemia presents today for overdue follow-up appointment.  She was last seen in 03/2020 and at that time she had been on disability since her spinal fusion in 2019.  She had a history of moderate aortic stenosis with TTE done 03/13/2020 with mean gradient 31 mmHg peak 48 mmHg and mild AR.  She had no history of CAD Myoview done 03/26/2019 with no ischemia EF 60%.   Today, she states she is not having any cardiovascular symptoms today.  She does have a blood pressure cuff at home and tries to monitor.  She states next week she is having back surgery.  Our office has not received any cardiac clearance for her surgery.  However, we did go through the DASI today and she barely met our 4 METS minimum requirement.  She scored a 4.31 METS.  It appears that her back limits her more than her cardiovascular health.  She uses a walker. She has a bad slipped disk   Past Medical History    Past Medical History:  Diagnosis Date   Allergy    Anemia    Anxiety    Aortic stenosis    moderate AS (01/11/18 echo)   Arthritis    Asthma    BMI 40.0-44.9, adult ([place]) 04/07/2014   DM type 2 (diabetes mellitus, type 2) (HCC)    Dyspnea    GERD (gastroesophageal reflux disease)    Heart murmur    HTN (hypertension)    Hypercholesterolemia    Osteoarthritis of left hip 04/07/2014   Reflux    Past Surgical History:  Procedure Laterality Date   CESAREAN SECTION     x3   COLONOSCOPY     TUBAL LIGATION      Allergies  Allergies  Allergen  Reactions   Methocarbamol Other (See Comments)    Insomnia; Kept pt awake for 2 days   Rosuvastatin Other (See Comments)   Atorvastatin Other (See Comments)    Myalgia   Sulfa Antibiotics Hives and Other (See Comments)     EKGs/Labs/Other Studies Reviewed:   The following studies were reviewed today:  Echocardiogram 01/2021  IMPRESSIONS     1. Left ventricular ejection fraction, by estimation, is 70 to 75%. The  left ventricle has hyperdynamic function. The left ventricle has no  regional wall motion abnormalities. There is mild concentric left  ventricular hypertrophy. Left ventricular  diastolic parameters were normal.   2. Right ventricular systolic function is normal. The right ventricular  size is normal.   3. The mitral valve is normal in structure. Trivial mitral valve  regurgitation. No evidence of mitral stenosis.   4. The aortic valve has an indeterminant number of cusps. There is  moderate calcification of the aortic valve. There is mild thickening of  the aortic valve. Aortic valve regurgitation is mild. Moderate aortic  valve stenosis. Aortic valve area, by VTI  measures 1.14 cm. Aortic valve mean gradient measures 19.7 mmHg. Aortic  valve Vmax measures 3.04 m/s.   5. The inferior vena cava is normal in  size with greater than 50%  respiratory variability, suggesting right atrial pressure of 3 mmHg.   6. Intra-atrial shunting cannot be excluded.   Comparison(s): No significant change from prior study. Prior echo moderate  AS; mean gradient slightly higher at 31 mmHg on prior study.   EKG:  EKG is not ordered today.  Recent Labs: 12/11/2021: ALT 18; BUN 29; Creatinine, Ser 1.18; Potassium 3.9; Sodium 141  Recent Lipid Panel    Component Value Date/Time   CHOL 247 (H) 09/30/2017 1554   TRIG 179 (H) 09/30/2017 1554   HDL 50 09/30/2017 1554   CHOLHDL 4.9 (H) 09/30/2017 1554   CHOLHDL 3.2 11/12/2015 1436   VLDL 28 11/12/2015 1436   LDLCALC 161 (H)  09/30/2017 1554    Home Medications   Current Meds  Medication Sig   albuterol (PROAIR HFA) 108 (90 BASE) MCG/ACT inhaler Inhale 2 puffs into the lungs every 6 (six) hours as needed. (Patient taking differently: Inhale 2 puffs into the lungs every 6 (six) hours as needed for wheezing or shortness of breath.)   amLODipine (NORVASC) 10 MG tablet Take 1 tablet (10 mg total) by mouth daily.   celecoxib (CELEBREX) 200 MG capsule Take 200 mg by mouth 2 (two) times daily.    cetirizine (ZYRTEC) 10 MG tablet Take 1 tablet (10 mg total) by mouth daily.   cyclobenzaprine (FLEXERIL) 5 MG tablet Take 5 mg by mouth 3 (three) times daily as needed.   diclofenac (VOLTAREN) 75 MG EC tablet Take 75 mg by mouth 2 (two) times daily.   DULoxetine (CYMBALTA) 60 MG capsule Take 60 mg by mouth daily.   fenofibrate 54 MG tablet Take 1 tablet (54 mg total) by mouth daily.   ferrous sulfate 325 (65 FE) MG EC tablet Take 325 mg by mouth daily.   gabapentin (NEURONTIN) 600 MG tablet Take 600 mg by mouth 3 (three) times daily.   lansoprazole (PREVACID) 30 MG capsule Take 1 capsule (30 mg total) by mouth daily.   losartan-hydrochlorothiazide (HYZAAR) 100-25 MG tablet Take 1 tablet by mouth daily.   metFORMIN (GLUCOPHAGE) 1000 MG tablet Take 1 tablet (1,000 mg total) by mouth 2 (two) times daily with a meal.   metoprolol succinate (TOPROL-XL) 100 MG 24 hr tablet Take 1 tablet (100 mg total) by mouth daily.   pantoprazole (PROTONIX) 40 MG tablet Take 40 mg by mouth daily.   tiZANidine (ZANAFLEX) 4 MG tablet Take 1 tablet (4 mg total) by mouth every 6 (six) hours as needed.   [DISCONTINUED] triamcinolone acetonide (KENALOG-40) 40 MG/ML injection Inject 2 mLs into the muscle once.     Review of Systems      All other systems reviewed and are otherwise negative except as noted above.  Physical Exam    VS:  BP 138/78   Pulse 92   Ht _0  (1.473 m)   Wt 201 lb (91.2 kg)   SpO2 97%   BMI 42.01 kg/m  , BMI Body  mass index is 42.01 kg/m.  Wt Readings from Last 3 Encounters:  01/07/22 201 lb (91.2 kg)  04/25/20 212 lb (96.2 kg)  09/03/19 205 lb (93 kg)     GEN: Well nourished, well developed, in no acute distress. HEENT: normal. Neck: Supple, no JVD, carotid bruits, or masses. Cardiac: RRR, + 5/6 systolic murmur radiates to the carotids, rubs, or gallops. No clubbing, cyanosis, edema.  Radials/PT 2+ and equal bilaterally.  Respiratory:  Respirations regular and unlabored, clear to auscultation bilaterally.  GI: Soft, nontender, nondistended. MS: No deformity or atrophy. Skin: Warm and dry, no rash. Neuro:  Strength and sensation are intact. Psych: Normal affect.  Assessment & Plan    Preop clearance (official clearance not sent and back surgery is 8/23)  Ms. Lucking's perioperative risk of a major cardiac event is 0.9% according to the Revised Cardiac Risk Index (RCRI).  Therefore, she is at low risk for perioperative complications.   Her functional capacity is fair at 4.31 METs according to the [facility] Activity Status Index (DASI). Recommendations: The patient requires an echocardiogram before a disposition can be made regarding surgical risk.                Antiplatelet and/or Anticoagulation Recommendations: She is not on any.  Hypertension -Well-controlled today in the clinic -Continue current medication regimen which includes metoprolol succinate 100 mg daily, Hyzaar 100-25 mg daily, and Norvasc 10 mg daily  Hyperlipidemia -She will need a updated lipid panel when she is next in the office -last lipid panel 2020, LDL 69  Moderate AS -She will need follow-up echocardiogram      ADDENDUM: I discussed her echocardiogram with Dr. Johnsie Cancel and he has released her to get her surgery done on Wednesday I will fax this to the neurosurgeons office.   Disposition: Follow up 1 year with Jenkins Rouge, MD or APP.  Signed, Elgie Collard, PA-C 01/07/2022, 3:17 PM [facility]

## 2022-01-07 ENCOUNTER — Ambulatory Visit: Payer: Medicare HMO | Admitting: Physician Assistant

## 2022-01-07 ENCOUNTER — Encounter: Payer: Self-pay | Admitting: Physician Assistant

## 2022-01-07 VITALS — BP 138/78 | HR 92 | Ht <= 58 in | Wt 201.0 lb

## 2022-01-07 DIAGNOSIS — I10 Essential (primary) hypertension: Secondary | ICD-10-CM | POA: Diagnosis not present

## 2022-01-07 DIAGNOSIS — E782 Mixed hyperlipidemia: Secondary | ICD-10-CM

## 2022-01-07 DIAGNOSIS — Z01810 Encounter for preprocedural cardiovascular examination: Secondary | ICD-10-CM | POA: Diagnosis not present

## 2022-01-07 DIAGNOSIS — I350 Nonrheumatic aortic (valve) stenosis: Secondary | ICD-10-CM

## 2022-01-07 NOTE — Patient Instructions (Signed)
Medication Instructions:  Your physician recommends that you continue on your current medications as directed. Please refer to the Current Medication list given to you today.  *If you need a refill on your cardiac medications before your next appointment, please call your pharmacy*   Lab Work: None If you have labs (blood work) drawn today and your tests are completely normal, you will receive your results only by: [place] (if you have MyChart) OR A paper copy in the mail If you have any lab test that is abnormal or we need to change your treatment, we will call you to review the results.   Testing/Procedures: Your physician has requested that you have an echocardiogram. Echocardiography is a painless test that uses sound waves to create images of your heart. It provides your doctor with information about the size and shape of your heart and how well your heart's chambers and valves are working. This procedure takes approximately one hour. There are no restrictions for this procedure.    Follow-Up: At [facility], you and your health needs are our priority.  As part of our continuing mission to provide you with exceptional heart care, we have created designated Provider Care Teams.  These Care Teams include your primary Cardiologist (physician) and Advanced Practice Providers (APPs -  Physician Assistants and Nurse Practitioners) who all work together to provide you with the care you need, when you need it.   Your next appointment:   1 year(s)  The format for your next appointment:   In Person  Provider:   Jenkins Rouge, MD {  Other Instructions Have your surgeon fax [place] an official pre-op clearance form as this has all of the necessary information that we need [phone]  Important Information About Sugar

## 2022-01-08 ENCOUNTER — Ambulatory Visit (HOSPITAL_COMMUNITY): Payer: Medicare HMO | Attending: Physician Assistant

## 2022-01-08 DIAGNOSIS — I350 Nonrheumatic aortic (valve) stenosis: Secondary | ICD-10-CM | POA: Insufficient documentation

## 2022-01-08 NOTE — Pre-Procedure Instructions (Signed)
Surgical Instructions    Your procedure is scheduled on Wednesday, August 23rd.  Report to [facility] Main Entrance "A" at 06:30 A.M., then check in with the Admitting office.  Call this number if you have problems the morning of surgery:  [phone]   If you have any questions prior to your surgery date call [phone]: Open Monday-Friday 8am-4pm    Remember:  Do not eat after midnight the night before your surgery  You may drink clear liquids until 05:30 AM the morning of your surgery.   Clear liquids allowed are: Water, Non-Citrus Juices (without pulp), Carbonated Beverages, Clear Tea, Black Coffee Only (NO MILK, CREAM OR POWDERED CREAMER of any kind), and Gatorade.    Take these medicines the morning of surgery with A SIP OF WATER  amLODipine (NORVASC)  cetirizine (ZYRTEC)  gabapentin (NEURONTIN) metoprolol succinate (TOPROL-XL)  pantoprazole (PROTONIX)   If needed: albuterol (VENTOLIN HFA)- if needed, bring with you on day of surgery  WHAT DO I DO ABOUT MY DIABETES MEDICATION?   Do not take metFORMIN (GLUCOPHAGE)  the morning of surgery.    HOW TO MANAGE YOUR DIABETES BEFORE AND AFTER SURGERY  Why is it important to control my blood sugar before and after surgery? Improving blood sugar levels before and after surgery helps healing and can limit problems. A way of improving blood sugar control is eating a healthy diet by:  Eating less sugar and carbohydrates  Increasing activity/exercise  Talking with your doctor about reaching your blood sugar goals High blood sugars (greater than 180 mg/dL) can raise your risk of infections and slow your recovery, so you will need to focus on controlling your diabetes during the weeks before surgery. Make sure that the doctor who takes care of your diabetes knows about your planned surgery including the date and location.  How do I manage my blood sugar before surgery? Check your blood sugar at least 4 times a day, starting 2  days before surgery, to make sure that the level is not too high or low.  Check your blood sugar the morning of your surgery when you wake up and every 2 hours until you get to the Short Stay unit.  If your blood sugar is less than 70 mg/dL, you will need to treat for low blood sugar: Do not take insulin. Treat a low blood sugar (less than 70 mg/dL) with  cup of clear juice (cranberry or apple), 4 glucose tablets, OR glucose gel. Recheck blood sugar in 15 minutes after treatment (to make sure it is greater than 70 mg/dL). If your blood sugar is not greater than 70 mg/dL on recheck, call [phone] for further instructions. Report your blood sugar to the short stay nurse when you get to Short Stay.  If you are admitted to the hospital after surgery: Your blood sugar will be checked by the staff and you will probably be given insulin after surgery (instead of oral diabetes medicines) to make sure you have good blood sugar levels. The goal for blood sugar control after surgery is 80-180 mg/dL.    As of today, STOP taking any Aspirin (unless otherwise instructed by your surgeon) Aleve, Naproxen, Ibuprofen, Motrin, Advil, Goody's, BC's, all herbal medications, fish oil, and all vitamins. This includes celecoxib (CELEBREX) and diclofenac (VOLTAREN).                     Do NOT Smoke (Tobacco/Vaping) for 24 hours prior to your procedure.  If you use a  CPAP at night, you may bring your mask/headgear for your overnight stay.   Contacts, glasses, piercing's, hearing aid's, dentures or partials may not be worn into surgery, please bring cases for these belongings.    For patients admitted to the hospital, discharge time will be determined by your treatment team.   Patients discharged the day of surgery will not be allowed to drive home, and someone needs to stay with them for 24 hours.  SURGICAL WAITING ROOM VISITATION Patients having surgery or a procedure may have no more than 2 support people  in the waiting area - these visitors may rotate.   Children under the age of 65 must have an adult with them who is not the patient. If the patient needs to stay at the hospital during part of their recovery, the visitor guidelines for inpatient rooms apply. Pre-op nurse will coordinate an appropriate time for 1 support person to accompany patient in pre-op.  This support person may not rotate.   Please refer to the [facility] website for the visitor guidelines for Inpatients (after your surgery is over and you are in a regular room).    Special instructions:   [place]- Preparing For Surgery  Before surgery, you can play an important role. Because skin is not sterile, your skin needs to be as free of germs as possible. You can reduce the number of germs on your skin by washing with CHG (chlorahexidine gluconate) Soap before surgery.  CHG is an antiseptic cleaner which kills germs and bonds with the skin to continue killing germs even after washing.    Oral Hygiene is also important to reduce your risk of infection.  Remember - BRUSH YOUR TEETH THE MORNING OF SURGERY WITH YOUR REGULAR TOOTHPASTE  Please do not use if you have an allergy to CHG or antibacterial soaps. If your skin becomes reddened/irritated stop using the CHG.  Do not shave (including legs and underarms) for at least 48 hours prior to first CHG shower. It is OK to shave your face.  Please follow these instructions carefully.   Shower the NIGHT BEFORE SURGERY and the MORNING OF SURGERY  If you chose to wash your hair, wash your hair first as usual with your normal shampoo.  After you shampoo, rinse your hair and body thoroughly to remove the shampoo.  Use CHG Soap as you would any other liquid soap. You can apply CHG directly to the skin and wash gently with a scrungie or a clean washcloth.   Apply the CHG Soap to your body ONLY FROM THE NECK DOWN.  Do not use on open wounds or open sores. Avoid contact with your eyes,  ears, mouth and genitals (private parts). Wash Face and genitals (private parts)  with your normal soap.   Wash thoroughly, paying special attention to the area where your surgery will be performed.  Thoroughly rinse your body with warm water from the neck down.  DO NOT shower/wash with your normal soap after using and rinsing off the CHG Soap.  Pat yourself dry with a CLEAN TOWEL.  Wear CLEAN PAJAMAS to bed the night before surgery  Place CLEAN SHEETS on your bed the night before your surgery  DO NOT SLEEP WITH PETS.   Day of Surgery: Take a shower with CHG soap. Do not wear jewelry or makeup Do not wear lotions, powders, perfumes, or deodorant. Do not shave 48 hours prior to surgery.   Do not bring valuables to the hospital. [facility] is  not responsible for any belongings or valuables. Do not wear nail polish, gel polish, artificial nails, or any other type of covering on natural nails (fingers and toes) If you have artificial nails or gel coating that need to be removed by a nail salon, please have this removed prior to surgery. Artificial nails or gel coating may interfere with anesthesia's ability to adequately monitor your vital signs. Wear Clean/Comfortable clothing the morning of surgery Remember to brush your teeth WITH YOUR REGULAR TOOTHPASTE.   Please read over the following fact sheets that you were given.    If you received a COVID test during your pre-op visit  it is requested that you wear a mask when out in public, stay away from anyone that may not be feeling well and notify your surgeon if you develop symptoms. If you have been in contact with anyone that has tested positive in the last 10 days please notify you surgeon.

## 2022-01-09 LAB — ECHOCARDIOGRAM COMPLETE
AR max vel: 0.86 cm2
AV Area VTI: 0.82 cm2
AV Area mean vel: 0.83 cm2
AV Mean grad: 33 mmHg
AV Peak grad: 58.1 mmHg
Ao pk vel: 3.81 m/s
Area-P 1/2: 4.17 cm2
P 1/2 time: 382 msec
S' Lateral: 2.1 cm

## 2022-01-11 ENCOUNTER — Encounter (HOSPITAL_COMMUNITY)
Admission: RE | Admit: 2022-01-11 | Discharge: 2022-01-11 | Disposition: A | Discharge status: Home / Self Care | Payer: Medicare HMO | Source: Ambulatory Visit | Attending: Neurosurgery | Admitting: Neurosurgery

## 2022-01-11 ENCOUNTER — Other Ambulatory Visit: Payer: Self-pay

## 2022-01-11 ENCOUNTER — Encounter (HOSPITAL_COMMUNITY): Payer: Self-pay

## 2022-01-11 VITALS — BP 183/85 | HR 106 | Temp 98.6°F | Resp 17 | Ht <= 58 in | Wt 299.6 lb

## 2022-01-11 DIAGNOSIS — E119 Type 2 diabetes mellitus without complications: Secondary | ICD-10-CM

## 2022-01-11 DIAGNOSIS — Z01818 Encounter for other preprocedural examination: Secondary | ICD-10-CM

## 2022-01-11 HISTORY — DX: Other reaction to spinal and lumbar puncture: G97.1

## 2022-01-11 LAB — CBC
HCT: 38.2 % (ref 36.0–46.0)
Hemoglobin: 11.8 g/dL — ABNORMAL LOW (ref 12.0–15.0)
MCH: 25 pg — ABNORMAL LOW (ref 26.0–34.0)
MCHC: 30.9 g/dL (ref 30.0–36.0)
MCV: 80.9 fL (ref 80.0–100.0)
Platelets: 356 10*3/uL (ref 150–400)
RBC: 4.72 MIL/uL (ref 3.87–5.11)
RDW: 16.4 % — ABNORMAL HIGH (ref 11.5–15.5)
WBC: 7.9 10*3/uL (ref 4.0–10.5)
nRBC: 0 % (ref 0.0–0.2)

## 2022-01-11 LAB — BASIC METABOLIC PANEL
Anion gap: 15 (ref 5–15)
BUN: 20 mg/dL (ref 8–23)
CO2: 23 mmol/L (ref 22–32)
Calcium: 10 mg/dL (ref 8.9–10.3)
Chloride: 100 mmol/L (ref 98–111)
Creatinine, Ser: 1.26 mg/dL — ABNORMAL HIGH (ref 0.44–1.00)
GFR, Estimated: 48 mL/min — ABNORMAL LOW (ref >60)
Glucose, Bld: 145 mg/dL — ABNORMAL HIGH (ref 70–99)
Potassium: 3.9 mmol/L (ref 3.5–5.1)
Sodium: 138 mmol/L (ref 135–145)

## 2022-01-11 LAB — TYPE AND SCREEN
ABO/RH(D): O POS
Antibody Screen: NEGATIVE

## 2022-01-11 LAB — SURGICAL PCR SCREEN
MRSA, PCR: NEGATIVE
Staphylococcus aureus: NEGATIVE

## 2022-01-11 LAB — HEMOGLOBIN A1C
Hgb A1c MFr Bld: 6.2 % — ABNORMAL HIGH (ref 4.8–5.6)
Mean Plasma Glucose: 131.24 mg/dL

## 2022-01-11 LAB — GLUCOSE, CAPILLARY: Glucose-Capillary: 144 mg/dL — ABNORMAL HIGH (ref 70–99)

## 2022-01-11 NOTE — Progress Notes (Signed)
PCP - Dr. Anastasia Pall Cardiologist - Dr. Jenkins Rouge  PPM/ICD - n/a  Chest x-ray - n/a EKG - 12/11/21 Stress Test - 03/26/19 ECHO - 01/08/22 Cardiac Cath - denies  Sleep Study - denies CPAP - denies  Fasting Blood Sugar - does not check CBG at home. Pt does have materials to do so. CBG at PAT144. Will obtain A1C today.  Blood Thinner Instructions: n/a Aspirin Instructions: n/a  ERAS Protcol -Clear liquids until 0530 DOS PRE-SURGERY Ensure or G2- none ordered.  COVID TEST- n/a  Anesthesia review: Yes, cardiac hx. Recent ECHO report stating Conclusion(s)/Recommendation(s): Moderate AS, mild-moderate AR. Pt has not been contacted regarding results of ECHO.  Patient denies shortness of breath, fever, cough and chest pain at PAT appointment   All instructions explained to the patient, with a verbal understanding of the material. Patient agrees to go over the instructions while at home for a better understanding. Patient also instructed to self quarantine after being tested for COVID-19. The opportunity to ask questions was provided.

## 2022-01-12 ENCOUNTER — Ambulatory Visit
Admission: RE | Admit: 2022-01-12 | Discharge: 2022-01-12 | Disposition: A | Discharge status: Home / Self Care | Payer: Medicare HMO | Source: Ambulatory Visit | Attending: Family Medicine | Admitting: Family Medicine

## 2022-01-12 ENCOUNTER — Encounter (HOSPITAL_COMMUNITY): Payer: Self-pay

## 2022-01-12 DIAGNOSIS — R928 Other abnormal and inconclusive findings on diagnostic imaging of breast: Secondary | ICD-10-CM

## 2022-01-12 NOTE — Anesthesia Preprocedure Evaluation (Signed)
Anesthesia Evaluation  Patient identified by MRN, date of birth, ID band Patient awake    Reviewed: Allergy & Precautions, NPO status , Patient's Chart, lab work & pertinent test results  Airway Mallampati: III  TM Distance: >3 FB Neck ROM: Full    Dental  (+) Partial Lower, Partial Upper, Dental Advisory Given   Pulmonary asthma ,    breath sounds clear to auscultation       Cardiovascular hypertension, + Valvular Problems/Murmurs AS and AI  Rhythm:Regular Rate:Normal + Systolic murmurs Echo: 1. Left ventricular ejection fraction, by estimation, is 70 to 75%. The  left ventricle has hyperdynamic function. The left ventricle has no  regional wall motion abnormalities. There is mild left ventricular  hypertrophy. Left ventricular diastolic  parameters are indeterminate. The average left ventricular global  longitudinal strain is -22.1 %. The global longitudinal strain is normal.  2. Right ventricular systolic function is normal. The right ventricular  size is normal. Tricuspid regurgitation signal is inadequate for assessing  PA pressure.  3. The mitral valve is normal in structure. Trivial mitral valve  regurgitation. No evidence of mitral stenosis.  4. The aortic valve has an indeterminant number of cusps. There is  moderate calcification of the aortic valve. There is mild thickening of  the aortic valve. Aortic valve regurgitation is mild to moderate. Moderate  aortic valve stenosis. Aortic valve  mean gradient measures 33.0 mmHg. Aortic valve Vmax measures 3.81 m/s.  5. The inferior vena cava is normal in size with greater than 50%  respiratory variability, suggesting right atrial pressure of 3 mmHg.    Neuro/Psych  Headaches, Anxiety    GI/Hepatic Neg liver ROS, GERD  ,  Endo/Other  diabetes  Renal/GU negative Renal ROS     Musculoskeletal  (+) Arthritis ,   Abdominal (+) + obese,   Peds  Hematology    Anesthesia Other Findings   Reproductive/Obstetrics                           Anesthesia Physical Anesthesia Plan  ASA: 3  Anesthesia Plan: General   Post-op Pain Management:    Induction: Intravenous  PONV Risk Score and Plan: 4 or greater and Ondansetron, Dexamethasone, Midazolam and Scopolamine patch - Pre-op  Airway Management Planned: Oral ETT  Additional Equipment: Arterial line  Intra-op Plan:   Post-operative Plan: Extubation in OR  Informed Consent: I have reviewed the patients History and Physical, chart, labs and discussed the procedure including the risks, benefits and alternatives for the proposed anesthesia with the patient or authorized representative who has indicated his/her understanding and acceptance.     Dental advisory given  Plan Discussed with: CRNA  Anesthesia Plan Comments: (PAT note by Karoline Caldwell, PA-C: Follows with cardiology for history of moderate aortic stenosis.  Nuclear stress 03/2019 showed no ischemia.  Seen by Nicholes Rough, PA-C on 01/07/2022 for preop evaluation.  Updated echo was ordered at that time.  Echo 01/09/2022 showed hyperdynamic function with EF 70 to 75%, moderate aortic stenosis with mean gradient 33 mmHg, mild to moderate aortic regurgitation.  Following echo, patient was cleared for surgery per addendum to note on 01/07/2022 stating, "ADDENDUM: I discussed her echocardiogram with Dr. Johnsie Cancel and he has released her to get her surgery done on Wednesday I will fax this to the neurosurgeons office."  Non-insulin-dependent DM2, well controlled, A1c 6.2 on preop labs.  History of spinal headache following lumbar fusion 2019.  Preop labs  reviewed, creatinine mildly elevated 1.26, mild anemia with hemoglobin 11.8, otherwise unremarkable.  EKG 12/11/2021: Normal sinus rhythm.  Rate 88. Minimal voltage criteria for LVH, may be normal variant ( R in aVL ). Nonspecific T wave abnormality  TTE 01/08/2022: 1. Left  ventricular ejection fraction, by estimation, is 70 to 75%. The  left ventricle has hyperdynamic function. The left ventricle has no  regional wall motion abnormalities. There is mild left ventricular  hypertrophy. Left ventricular diastolic  parameters are indeterminate. The average left ventricular global  longitudinal strain is -22.1 %. The global longitudinal strain is normal.  2. Right ventricular systolic function is normal. The right ventricular  size is normal. Tricuspid regurgitation signal is inadequate for assessing  PA pressure.  3. The mitral valve is normal in structure. Trivial mitral valve  regurgitation. No evidence of mitral stenosis.  4. The aortic valve has an indeterminant number of cusps. There is  moderate calcification of the aortic valve. There is mild thickening of  the aortic valve. Aortic valve regurgitation is mild to moderate. Moderate  aortic valve stenosis. Aortic valve  mean gradient measures 33.0 mmHg. Aortic valve Vmax measures 3.81 m/s.  5. The inferior vena cava is normal in size with greater than 50%  respiratory variability, suggesting right atrial pressure of 3 mmHg.   Comparison(s): No significant change from prior study.   Conclusion(s)/Recommendation(s): Moderate AS, mild-moderate AR.  )      Anesthesia Quick Evaluation

## 2022-01-12 NOTE — Progress Notes (Signed)
Anesthesia Chart Review:  Follows with cardiology for history of moderate aortic stenosis.  Nuclear stress 03/2019 showed no ischemia.  Seen by Nicholes Rough, PA-C on 01/07/2022 for preop evaluation.  Updated echo was ordered at that time.  Echo 01/09/2022 showed hyperdynamic function with EF 70 to 75%, moderate aortic stenosis with mean gradient 33 mmHg, mild to moderate aortic regurgitation.  Following echo, patient was cleared for surgery per addendum to note on 01/07/2022 stating, "ADDENDUM: I discussed her echocardiogram with Dr. Johnsie Cancel and he has released her to get her surgery done on Wednesday I will fax this to the neurosurgeons office."  Non-insulin-dependent DM2, well controlled, A1c 6.2 on preop labs.  History of spinal headache following lumbar fusion 2019.  Preop labs reviewed, creatinine mildly elevated 1.26, mild anemia with hemoglobin 11.8, otherwise unremarkable.  EKG 12/11/2021: Normal sinus rhythm.  Rate 88. Minimal voltage criteria for LVH, may be normal variant ( R in aVL ). Nonspecific T wave abnormality  TTE 01/08/2022:  1. Left ventricular ejection fraction, by estimation, is 70 to 75%. The  left ventricle has hyperdynamic function. The left ventricle has no  regional wall motion abnormalities. There is mild left ventricular  hypertrophy. Left ventricular diastolic  parameters are indeterminate. The average left ventricular global  longitudinal strain is -22.1 %. The global longitudinal strain is normal.   2. Right ventricular systolic function is normal. The right ventricular  size is normal. Tricuspid regurgitation signal is inadequate for assessing  PA pressure.   3. The mitral valve is normal in structure. Trivial mitral valve  regurgitation. No evidence of mitral stenosis.   4. The aortic valve has an indeterminant number of cusps. There is  moderate calcification of the aortic valve. There is mild thickening of  the aortic valve. Aortic valve regurgitation is mild to  moderate. Moderate  aortic valve stenosis. Aortic valve  mean gradient measures 33.0 mmHg. Aortic valve Vmax measures 3.81 m/s.   5. The inferior vena cava is normal in size with greater than 50%  respiratory variability, suggesting right atrial pressure of 3 mmHg.   Comparison(s): No significant change from prior study.   Conclusion(s)/Recommendation(s): Moderate AS, mild-moderate AR.     Wynonia Musty [facility] Short Stay Center/Anesthesiology Phone [phone] 01/12/2022 10:08 AM

## 2022-01-13 ENCOUNTER — Encounter (HOSPITAL_COMMUNITY)
Admission: RE | Disposition: A | Discharge status: Skilled Nursing Facility | Payer: Self-pay | Source: Home / Self Care | Attending: Neurosurgery

## 2022-01-13 ENCOUNTER — Other Ambulatory Visit: Payer: Self-pay

## 2022-01-13 ENCOUNTER — Ambulatory Visit (HOSPITAL_BASED_OUTPATIENT_CLINIC_OR_DEPARTMENT_OTHER): Payer: Medicare HMO | Admitting: Certified Registered Nurse Anesthetist

## 2022-01-13 ENCOUNTER — Ambulatory Visit (HOSPITAL_COMMUNITY): Payer: Medicare HMO

## 2022-01-13 ENCOUNTER — Observation Stay (HOSPITAL_COMMUNITY)
Admission: RE | Admit: 2022-01-13 | Discharge: 2022-01-19 | Disposition: A | Discharge status: Skilled Nursing Facility | Payer: Medicare HMO | Attending: Neurosurgery | Admitting: Neurosurgery

## 2022-01-13 ENCOUNTER — Encounter (HOSPITAL_COMMUNITY): Payer: Self-pay | Admitting: Neurosurgery

## 2022-01-13 ENCOUNTER — Ambulatory Visit (HOSPITAL_COMMUNITY): Payer: Medicare HMO | Admitting: Physician Assistant

## 2022-01-13 DIAGNOSIS — M960 Pseudarthrosis after fusion or arthrodesis: Secondary | ICD-10-CM

## 2022-01-13 DIAGNOSIS — Z7984 Long term (current) use of oral hypoglycemic drugs: Secondary | ICD-10-CM | POA: Insufficient documentation

## 2022-01-13 DIAGNOSIS — M4316 Spondylolisthesis, lumbar region: Principal | ICD-10-CM | POA: Insufficient documentation

## 2022-01-13 DIAGNOSIS — Z79899 Other long term (current) drug therapy: Secondary | ICD-10-CM | POA: Diagnosis not present

## 2022-01-13 DIAGNOSIS — M47816 Spondylosis without myelopathy or radiculopathy, lumbar region: Secondary | ICD-10-CM | POA: Insufficient documentation

## 2022-01-13 DIAGNOSIS — I10 Essential (primary) hypertension: Secondary | ICD-10-CM | POA: Diagnosis not present

## 2022-01-13 DIAGNOSIS — M5136 Other intervertebral disc degeneration, lumbar region: Secondary | ICD-10-CM | POA: Diagnosis not present

## 2022-01-13 DIAGNOSIS — E119 Type 2 diabetes mellitus without complications: Secondary | ICD-10-CM

## 2022-01-13 DIAGNOSIS — M5126 Other intervertebral disc displacement, lumbar region: Secondary | ICD-10-CM | POA: Diagnosis not present

## 2022-01-13 LAB — GLUCOSE, CAPILLARY
Glucose-Capillary: 125 mg/dL — ABNORMAL HIGH (ref 70–99)
Glucose-Capillary: 109 mg/dL — ABNORMAL HIGH (ref 70–99)
Glucose-Capillary: 131 mg/dL — ABNORMAL HIGH (ref 70–99)
Glucose-Capillary: 173 mg/dL — ABNORMAL HIGH (ref 70–99)
Glucose-Capillary: 161 mg/dL — ABNORMAL HIGH (ref 70–99)

## 2022-01-13 SURGERY — POSTERIOR LUMBAR FUSION 1 LEVEL
Anesthesia: General | Site: Spine Lumbar

## 2022-01-13 MED ORDER — LIDOCAINE-EPINEPHRINE 0.5 %-1:200000 IJ SOLN
INTRAMUSCULAR | Status: DC | PRN
  Administered 2022-01-13: 9 mL

## 2022-01-13 MED ORDER — PHENOL 1.4 % MT LIQD: 1.0000 | OROMUCOSAL | Status: DC | PRN

## 2022-01-13 MED ORDER — PHENYLEPHRINE 80 MCG/ML (10ML) SYRINGE FOR IV PUSH (FOR BLOOD PRESSURE SUPPORT)
PREFILLED_SYRINGE | INTRAVENOUS | Status: AC
Start: 2022-01-13 — End: ?
  Filled 2022-01-13: qty 20

## 2022-01-13 MED ORDER — ONDANSETRON HCL 4 MG/2ML IJ SOLN
INTRAMUSCULAR | Status: DC | PRN
  Administered 2022-01-13: 4 mg via INTRAVENOUS

## 2022-01-13 MED ORDER — LIDOCAINE-EPINEPHRINE 0.5 %-1:200000 IJ SOLN
INTRAMUSCULAR | Status: AC
  Filled 2022-01-13: qty 1

## 2022-01-13 MED ORDER — HYDROMORPHONE HCL 1 MG/ML IJ SOLN
INTRAMUSCULAR | Status: AC
  Filled 2022-01-13: qty 0.5

## 2022-01-13 MED ORDER — ONDANSETRON HCL 4 MG PO TABS: 4.0000 mg | ORAL_TABLET | Freq: Four times a day (QID) | ORAL | Status: DC | PRN

## 2022-01-13 MED ORDER — LORATADINE 10 MG PO TABS
10.0000 mg | ORAL_TABLET | Freq: Every day | ORAL | Status: DC
  Administered 2022-01-14 – 2022-01-19 (×6): 10 mg via ORAL
  Filled 2022-01-13 (×6): qty 1

## 2022-01-13 MED ORDER — ROCURONIUM BROMIDE 10 MG/ML (PF) SYRINGE
PREFILLED_SYRINGE | INTRAVENOUS | Status: AC
Start: 2022-01-13 — End: ?
  Filled 2022-01-13: qty 10

## 2022-01-13 MED ORDER — LACTATED RINGERS IV SOLN: INTRAVENOUS | Status: DC

## 2022-01-13 MED ORDER — PROMETHAZINE HCL 25 MG/ML IJ SOLN: 6.2500 mg | INTRAMUSCULAR | Status: DC | PRN

## 2022-01-13 MED ORDER — MENTHOL 3 MG MT LOZG: 1.0000 | LOZENGE | OROMUCOSAL | Status: DC | PRN

## 2022-01-13 MED ORDER — HEMOSTATIC AGENTS (NO CHARGE) OPTIME
TOPICAL | Status: DC | PRN
  Administered 2022-01-13: 1 via TOPICAL

## 2022-01-13 MED ORDER — AMISULPRIDE (ANTIEMETIC) 5 MG/2ML IV SOLN: 10.0000 mg | Freq: Once | INTRAVENOUS | Status: DC | PRN

## 2022-01-13 MED ORDER — OXYCODONE HCL 5 MG PO TABS
10.0000 mg | ORAL_TABLET | ORAL | Status: DC | PRN
  Administered 2022-01-14 – 2022-01-17 (×3): 10 mg via ORAL
  Filled 2022-01-13 (×4): qty 2

## 2022-01-13 MED ORDER — SODIUM CHLORIDE 0.9 % IV SOLN: 250.0000 mL | INTRAVENOUS | Status: DC

## 2022-01-13 MED ORDER — MIDAZOLAM HCL 2 MG/2ML IJ SOLN
INTRAMUSCULAR | Status: AC
  Filled 2022-01-13: qty 2

## 2022-01-13 MED ORDER — CHLORHEXIDINE GLUCONATE CLOTH 2 % EX PADS: 6.0000 | MEDICATED_PAD | Freq: Once | CUTANEOUS | Status: DC

## 2022-01-13 MED ORDER — THROMBIN 20000 UNITS EX SOLR
CUTANEOUS | Status: AC
  Filled 2022-01-13: qty 20000

## 2022-01-13 MED ORDER — OXYCODONE HCL 5 MG PO TABS
5.0000 mg | ORAL_TABLET | ORAL | Status: DC | PRN
  Administered 2022-01-14: 5 mg via ORAL

## 2022-01-13 MED ORDER — DEXAMETHASONE SODIUM PHOSPHATE 10 MG/ML IJ SOLN
INTRAMUSCULAR | Status: AC
  Filled 2022-01-13: qty 1

## 2022-01-13 MED ORDER — FENTANYL CITRATE (PF) 250 MCG/5ML IJ SOLN
INTRAMUSCULAR | Status: DC | PRN
  Administered 2022-01-13 (×5): 50 ug via INTRAVENOUS

## 2022-01-13 MED ORDER — ALBUMIN HUMAN 5 % IV SOLN: INTRAVENOUS | Status: DC | PRN

## 2022-01-13 MED ORDER — CHLORHEXIDINE GLUCONATE 0.12 % MT SOLN
15.0000 mL | Freq: Once | OROMUCOSAL | Status: AC
  Administered 2022-01-13: 15 mL via OROMUCOSAL
  Filled 2022-01-13: qty 15

## 2022-01-13 MED ORDER — HYDROMORPHONE HCL 1 MG/ML IJ SOLN
INTRAMUSCULAR | Status: AC
  Filled 2022-01-13: qty 1

## 2022-01-13 MED ORDER — METOPROLOL SUCCINATE ER 50 MG PO TB24
100.0000 mg | ORAL_TABLET | Freq: Every day | ORAL | Status: DC
  Administered 2022-01-13 – 2022-01-18 (×6): 100 mg via ORAL
  Filled 2022-01-13 (×6): qty 2

## 2022-01-13 MED ORDER — HEPARIN SODIUM (PORCINE) 5000 UNIT/ML IJ SOLN
5000.0000 [IU] | Freq: Three times a day (TID) | INTRAMUSCULAR | Status: DC
  Administered 2022-01-13 – 2022-01-19 (×14): 5000 [IU] via SUBCUTANEOUS
  Filled 2022-01-13 (×14): qty 1

## 2022-01-13 MED ORDER — ACETAMINOPHEN 10 MG/ML IV SOLN
1000.0000 mg | Freq: Once | INTRAVENOUS | Status: DC | PRN
  Administered 2022-01-13: 1000 mg via INTRAVENOUS

## 2022-01-13 MED ORDER — LOSARTAN POTASSIUM-HCTZ 100-25 MG PO TABS: 1.0000 | ORAL_TABLET | Freq: Every day | ORAL | Status: DC

## 2022-01-13 MED ORDER — LACTATED RINGERS IV SOLN: INTRAVENOUS | Status: DC | PRN

## 2022-01-13 MED ORDER — CEFAZOLIN IN SODIUM CHLORIDE 3-0.9 GM/100ML-% IV SOLN
3.0000 g | INTRAVENOUS | Status: DC
  Filled 2022-01-13: qty 100

## 2022-01-13 MED ORDER — SENNOSIDES-DOCUSATE SODIUM 8.6-50 MG PO TABS
1.0000 | ORAL_TABLET | Freq: Every evening | ORAL | Status: DC | PRN
  Administered 2022-01-15: 1 via ORAL
  Filled 2022-01-13: qty 1

## 2022-01-13 MED ORDER — ROCURONIUM BROMIDE 10 MG/ML (PF) SYRINGE
PREFILLED_SYRINGE | INTRAVENOUS | Status: DC | PRN
  Administered 2022-01-13: 70 mg via INTRAVENOUS
  Administered 2022-01-13 (×2): 20 mg via INTRAVENOUS
  Administered 2022-01-13: 10 mg via INTRAVENOUS

## 2022-01-13 MED ORDER — HYDROMORPHONE HCL 1 MG/ML IJ SOLN
INTRAMUSCULAR | Status: DC | PRN
  Administered 2022-01-13 (×2): .5 mg via INTRAVENOUS

## 2022-01-13 MED ORDER — LOSARTAN POTASSIUM 50 MG PO TABS
100.0000 mg | ORAL_TABLET | Freq: Every day | ORAL | Status: DC
  Administered 2022-01-14 – 2022-01-19 (×6): 100 mg via ORAL
  Filled 2022-01-13 (×6): qty 2

## 2022-01-13 MED ORDER — SODIUM CHLORIDE 0.9% FLUSH: 3.0000 mL | INTRAVENOUS | Status: DC | PRN

## 2022-01-13 MED ORDER — ALBUTEROL SULFATE (2.5 MG/3ML) 0.083% IN NEBU: 3.0000 mL | INHALATION_SOLUTION | Freq: Four times a day (QID) | RESPIRATORY_TRACT | Status: DC | PRN

## 2022-01-13 MED ORDER — ACETAMINOPHEN 160 MG/5ML PO SOLN: 325.0000 mg | Freq: Once | ORAL | Status: DC | PRN

## 2022-01-13 MED ORDER — GABAPENTIN 600 MG PO TABS
600.0000 mg | ORAL_TABLET | Freq: Three times a day (TID) | ORAL | Status: DC
  Administered 2022-01-13 – 2022-01-19 (×18): 600 mg via ORAL
  Filled 2022-01-13 (×18): qty 1

## 2022-01-13 MED ORDER — BISACODYL 5 MG PO TBEC: 5.0000 mg | DELAYED_RELEASE_TABLET | Freq: Every day | ORAL | Status: DC | PRN

## 2022-01-13 MED ORDER — DEXAMETHASONE SODIUM PHOSPHATE 10 MG/ML IJ SOLN
INTRAMUSCULAR | Status: DC | PRN
  Administered 2022-01-13: 5 mg via INTRAVENOUS

## 2022-01-13 MED ORDER — ONDANSETRON HCL 4 MG/2ML IJ SOLN: 4.0000 mg | Freq: Four times a day (QID) | INTRAMUSCULAR | Status: DC | PRN

## 2022-01-13 MED ORDER — PHENYLEPHRINE HCL-NACL 20-0.9 MG/250ML-% IV SOLN
INTRAVENOUS | Status: DC | PRN
  Administered 2022-01-13: 15 ug/min via INTRAVENOUS

## 2022-01-13 MED ORDER — ACETAMINOPHEN 325 MG PO TABS
650.0000 mg | ORAL_TABLET | ORAL | Status: DC | PRN
  Administered 2022-01-16: 650 mg via ORAL
  Filled 2022-01-13: qty 2

## 2022-01-13 MED ORDER — CELECOXIB 200 MG PO CAPS
200.0000 mg | ORAL_CAPSULE | Freq: Two times a day (BID) | ORAL | Status: DC
  Administered 2022-01-13 – 2022-01-19 (×12): 200 mg via ORAL
  Filled 2022-01-13 (×12): qty 1

## 2022-01-13 MED ORDER — LIDOCAINE 2% (20 MG/ML) 5 ML SYRINGE
INTRAMUSCULAR | Status: AC
Start: 2022-01-13 — End: ?
  Filled 2022-01-13: qty 5

## 2022-01-13 MED ORDER — PROPOFOL 10 MG/ML IV BOLUS
INTRAVENOUS | Status: AC
  Filled 2022-01-13: qty 20

## 2022-01-13 MED ORDER — BUPIVACAINE HCL (PF) 0.5 % IJ SOLN
INTRAMUSCULAR | Status: AC
  Filled 2022-01-13: qty 30

## 2022-01-13 MED ORDER — VITAMIN B-12 1000 MCG PO TABS
1000.0000 ug | ORAL_TABLET | Freq: Every day | ORAL | Status: DC
  Administered 2022-01-14 – 2022-01-19 (×6): 1000 ug via ORAL
  Filled 2022-01-13 (×5): qty 1

## 2022-01-13 MED ORDER — DIAZEPAM 5 MG PO TABS
5.0000 mg | ORAL_TABLET | Freq: Four times a day (QID) | ORAL | Status: DC | PRN
  Administered 2022-01-14: 5 mg via ORAL
  Filled 2022-01-13: qty 1

## 2022-01-13 MED ORDER — ZOLPIDEM TARTRATE 5 MG PO TABS
5.0000 mg | ORAL_TABLET | Freq: Every evening | ORAL | Status: DC | PRN
  Administered 2022-01-13 – 2022-01-17 (×2): 5 mg via ORAL
  Filled 2022-01-13 (×2): qty 1

## 2022-01-13 MED ORDER — ACETAMINOPHEN 325 MG PO TABS: 325.0000 mg | ORAL_TABLET | Freq: Once | ORAL | Status: DC | PRN

## 2022-01-13 MED ORDER — PHENYLEPHRINE 80 MCG/ML (10ML) SYRINGE FOR IV PUSH (FOR BLOOD PRESSURE SUPPORT)
PREFILLED_SYRINGE | INTRAVENOUS | Status: DC | PRN
  Administered 2022-01-13 (×7): 80 ug via INTRAVENOUS

## 2022-01-13 MED ORDER — ACETAMINOPHEN 650 MG RE SUPP: 650.0000 mg | RECTAL | Status: DC | PRN

## 2022-01-13 MED ORDER — THROMBIN 20000 UNITS EX SOLR
CUTANEOUS | Status: DC | PRN
  Administered 2022-01-13: 20 mL via TOPICAL

## 2022-01-13 MED ORDER — ROCURONIUM BROMIDE 10 MG/ML (PF) SYRINGE
PREFILLED_SYRINGE | INTRAVENOUS | Status: AC
  Filled 2022-01-13: qty 10

## 2022-01-13 MED ORDER — 0.9 % SODIUM CHLORIDE (POUR BTL) OPTIME
TOPICAL | Status: DC | PRN
  Administered 2022-01-13: 1000 mL

## 2022-01-13 MED ORDER — PANTOPRAZOLE SODIUM 40 MG PO TBEC: 40.0000 mg | DELAYED_RELEASE_TABLET | Freq: Every day | ORAL | Status: DC

## 2022-01-13 MED ORDER — INSULIN ASPART 100 UNIT/ML IJ SOLN: 0.0000 [IU] | INTRAMUSCULAR | Status: DC | PRN

## 2022-01-13 MED ORDER — PANTOPRAZOLE SODIUM 40 MG PO TBEC
40.0000 mg | DELAYED_RELEASE_TABLET | Freq: Every day | ORAL | Status: DC
  Administered 2022-01-14 – 2022-01-19 (×6): 40 mg via ORAL
  Filled 2022-01-13 (×6): qty 1

## 2022-01-13 MED ORDER — POLYSACCHARIDE IRON COMPLEX 150 MG PO CAPS
150.0000 mg | ORAL_CAPSULE | Freq: Every day | ORAL | Status: DC
  Administered 2022-01-14 – 2022-01-19 (×6): 150 mg via ORAL
  Filled 2022-01-13 (×6): qty 1

## 2022-01-13 MED ORDER — CEFAZOLIN SODIUM-DEXTROSE 2-3 GM-%(50ML) IV SOLR
INTRAVENOUS | Status: DC | PRN
  Administered 2022-01-13: 2 g via INTRAVENOUS

## 2022-01-13 MED ORDER — HYDROMORPHONE HCL 1 MG/ML IJ SOLN
0.2500 mg | INTRAMUSCULAR | Status: DC | PRN
  Administered 2022-01-13 (×2): 0.5 mg via INTRAVENOUS

## 2022-01-13 MED ORDER — MEPERIDINE HCL 25 MG/ML IJ SOLN: 6.2500 mg | INTRAMUSCULAR | Status: DC | PRN

## 2022-01-13 MED ORDER — POTASSIUM CHLORIDE IN NACL 20-0.9 MEQ/L-% IV SOLN
INTRAVENOUS | Status: DC
  Filled 2022-01-13: qty 1000

## 2022-01-13 MED ORDER — METFORMIN HCL 500 MG PO TABS
1000.0000 mg | ORAL_TABLET | Freq: Two times a day (BID) | ORAL | Status: DC
  Administered 2022-01-14 – 2022-01-19 (×12): 1000 mg via ORAL
  Filled 2022-01-13 (×12): qty 2

## 2022-01-13 MED ORDER — HYDROCHLOROTHIAZIDE 25 MG PO TABS
25.0000 mg | ORAL_TABLET | Freq: Every day | ORAL | Status: DC
  Administered 2022-01-14 – 2022-01-19 (×6): 25 mg via ORAL
  Filled 2022-01-13 (×6): qty 1

## 2022-01-13 MED ORDER — OXYCODONE HCL ER 10 MG PO T12A
10.0000 mg | EXTENDED_RELEASE_TABLET | Freq: Two times a day (BID) | ORAL | Status: DC
  Administered 2022-01-13 – 2022-01-19 (×12): 10 mg via ORAL
  Filled 2022-01-13 (×12): qty 1

## 2022-01-13 MED ORDER — FENTANYL CITRATE (PF) 250 MCG/5ML IJ SOLN
INTRAMUSCULAR | Status: AC
  Filled 2022-01-13: qty 5

## 2022-01-13 MED ORDER — SODIUM CHLORIDE 0.9% FLUSH
3.0000 mL | Freq: Two times a day (BID) | INTRAVENOUS | Status: DC
  Administered 2022-01-13 – 2022-01-18 (×8): 3 mL via INTRAVENOUS

## 2022-01-13 MED ORDER — AMLODIPINE BESYLATE 10 MG PO TABS
10.0000 mg | ORAL_TABLET | Freq: Every day | ORAL | Status: DC
  Administered 2022-01-14 – 2022-01-19 (×6): 10 mg via ORAL
  Filled 2022-01-13 (×6): qty 1

## 2022-01-13 MED ORDER — DOCUSATE SODIUM 100 MG PO CAPS: 100.0000 mg | ORAL_CAPSULE | Freq: Two times a day (BID) | ORAL | Status: DC

## 2022-01-13 MED ORDER — ACETAMINOPHEN 10 MG/ML IV SOLN
INTRAVENOUS | Status: AC
  Filled 2022-01-13: qty 100

## 2022-01-13 MED ORDER — MIDAZOLAM HCL 5 MG/5ML IJ SOLN
INTRAMUSCULAR | Status: DC | PRN
  Administered 2022-01-13: 2 mg via INTRAVENOUS

## 2022-01-13 MED ORDER — PROPOFOL 10 MG/ML IV BOLUS
INTRAVENOUS | Status: DC | PRN
  Administered 2022-01-13: 120 mg via INTRAVENOUS

## 2022-01-13 MED ORDER — ONDANSETRON HCL 4 MG/2ML IJ SOLN
INTRAMUSCULAR | Status: AC
  Filled 2022-01-13: qty 2

## 2022-01-13 MED ORDER — ORAL CARE MOUTH RINSE: 15.0000 mL | Freq: Once | OROMUCOSAL | Status: AC

## 2022-01-13 MED ORDER — DOCUSATE SODIUM 100 MG PO CAPS
100.0000 mg | ORAL_CAPSULE | Freq: Two times a day (BID) | ORAL | Status: DC
  Administered 2022-01-13 – 2022-01-19 (×12): 100 mg via ORAL
  Filled 2022-01-13 (×12): qty 1

## 2022-01-13 MED ORDER — LIDOCAINE 2% (20 MG/ML) 5 ML SYRINGE
INTRAMUSCULAR | Status: DC | PRN
  Administered 2022-01-13: 40 mg via INTRAVENOUS

## 2022-01-13 MED ORDER — SUGAMMADEX SODIUM 200 MG/2ML IV SOLN
INTRAVENOUS | Status: DC | PRN
  Administered 2022-01-13: 200 mg via INTRAVENOUS

## 2022-01-13 MED ORDER — MORPHINE SULFATE (PF) 2 MG/ML IV SOLN
2.0000 mg | INTRAVENOUS | Status: DC | PRN
  Administered 2022-01-14: 2 mg via INTRAVENOUS
  Filled 2022-01-13: qty 1

## 2022-01-13 SURGICAL SUPPLY — 76 items
ADH SKN CLS APL DERMABOND .7 (GAUZE/BANDAGES/DRESSINGS) ×1
ALLOGRAFT SALERA POWDER 80 (Graft) IMPLANT
APL SKNCLS STERI-STRIP NONHPOA (GAUZE/BANDAGES/DRESSINGS)
BAG COUNTER SPONGE SURGICOUNT (BAG) ×1 IMPLANT
BAG SPNG CNTER NS LX DISP (BAG) ×1
BASKET BONE COLLECTION (BASKET) IMPLANT
BENZOIN TINCTURE PRP APPL 2/3 (GAUZE/BANDAGES/DRESSINGS) IMPLANT
BIT DRILL PLIF MAS DISP 5.5MM (DRILL) IMPLANT
BLADE BONE MILL MEDIUM (MISCELLANEOUS) ×1 IMPLANT
BLADE CLIPPER SURG (BLADE) IMPLANT
BUR MATCHSTICK NEURO 3.0 LAGG (BURR) ×1 IMPLANT
BUR PRECISION FLUTE 5.0 (BURR) ×1 IMPLANT
CAGE CONVEX CASCADIA 8.5X22X9 (Cage) IMPLANT
CANISTER SUCT 3000ML PPV (MISCELLANEOUS) ×1 IMPLANT
CAP RELINE MOD TULIP RMM (Cap) IMPLANT
CARTRIDGE OIL MAESTRO DRILL (MISCELLANEOUS) ×1 IMPLANT
CNTNR URN SCR LID CUP LEK RST (MISCELLANEOUS) ×1 IMPLANT
CONT SPEC 4OZ STRL OR WHT (MISCELLANEOUS) ×1
COVER BACK TABLE 60X90IN (DRAPES) ×1 IMPLANT
DERMABOND ADVANCED (GAUZE/BANDAGES/DRESSINGS) ×1
DERMABOND ADVANCED .7 DNX12 (GAUZE/BANDAGES/DRESSINGS) ×1 IMPLANT
DIFFUSER DRILL AIR PNEUMATIC (MISCELLANEOUS) ×1 IMPLANT
DRAPE C-ARM 42X72 X-RAY (DRAPES) ×2 IMPLANT
DRAPE C-ARMOR (DRAPES) IMPLANT
DRAPE LAPAROTOMY 100X72X124 (DRAPES) ×1 IMPLANT
DRAPE SURG 17X23 STRL (DRAPES) ×1 IMPLANT
DRILL PLIF MAS DISP 5.5MM (DRILL) ×1
DRSG OPSITE POSTOP 4X8 (GAUZE/BANDAGES/DRESSINGS) IMPLANT
DURAPREP 26ML APPLICATOR (WOUND CARE) ×1 IMPLANT
ELECT REM PT RETURN 9FT ADLT (ELECTROSURGICAL) ×1
ELECTRODE REM PT RTRN 9FT ADLT (ELECTROSURGICAL) ×1 IMPLANT
GAUZE 4X4 16PLY ~~LOC~~+RFID DBL (SPONGE) IMPLANT
GAUZE SPONGE 4X4 12PLY STRL (GAUZE/BANDAGES/DRESSINGS) IMPLANT
GLOVE BIO SURGEON STRL SZ 6.5 (GLOVE) IMPLANT
GLOVE BIOGEL PI IND STRL 7.5 (GLOVE) IMPLANT
GLOVE BIOGEL PI INDICATOR 7.5 (GLOVE) ×1
GLOVE EXAM NITRILE XL STR (GLOVE) IMPLANT
GLOVE SURG LTX SZ6.5 (GLOVE) ×2 IMPLANT
GOWN STRL REUS W/ TWL LRG LVL3 (GOWN DISPOSABLE) ×2 IMPLANT
GOWN STRL REUS W/ TWL XL LVL3 (GOWN DISPOSABLE) IMPLANT
GOWN STRL REUS W/TWL 2XL LVL3 (GOWN DISPOSABLE) IMPLANT
GOWN STRL REUS W/TWL LRG LVL3 (GOWN DISPOSABLE) ×2
GOWN STRL REUS W/TWL XL LVL3 (GOWN DISPOSABLE) ×2
GRAFT DURAGEN MATRIX 1WX1L (Tissue) IMPLANT
KIT BASIN OR (CUSTOM PROCEDURE TRAY) ×1 IMPLANT
KIT POSITION SURG JACKSON T1 (MISCELLANEOUS) ×1 IMPLANT
KIT TURNOVER KIT B (KITS) ×1 IMPLANT
MILL BONE PREP (MISCELLANEOUS) IMPLANT
NDL HYPO 25X1 1.5 SAFETY (NEEDLE) ×1 IMPLANT
NDL SPNL 18GX3.5 QUINCKE PK (NEEDLE) IMPLANT
NEEDLE HYPO 25X1 1.5 SAFETY (NEEDLE) ×1 IMPLANT
NEEDLE SPNL 18GX3.5 QUINCKE PK (NEEDLE) ×1 IMPLANT
NS IRRIG 1000ML POUR BTL (IV SOLUTION) ×1 IMPLANT
OIL CARTRIDGE MAESTRO DRILL (MISCELLANEOUS) ×1
PACK LAMINECTOMY NEURO (CUSTOM PROCEDURE TRAY) ×1 IMPLANT
PAD ARMBOARD 7.5X6 YLW CONV (MISCELLANEOUS) ×2 IMPLANT
ROD RELINE COCR LORD 5.0X70MM (Rod) IMPLANT
SCREW LOCK RSS 4.5/5.0MM (Screw) IMPLANT
SCREW SHANK RELINE MOD 5.5X30 (Screw) IMPLANT
SCREW SHANK RELINE MOD 7.5X30 (Screw) IMPLANT
SEALANT ADHERUS EXTEND TIP (MISCELLANEOUS) IMPLANT
SHANK RELINE O MOD 6.5X45 (Screw) IMPLANT
SPIKE FLUID TRANSFER (MISCELLANEOUS) ×1 IMPLANT
SPONGE SURGIFOAM ABS GEL 100 (HEMOSTASIS) ×1 IMPLANT
SPONGE T-LAP 4X18 ~~LOC~~+RFID (SPONGE) IMPLANT
STRIP CLOSURE SKIN 1/2X4 (GAUZE/BANDAGES/DRESSINGS) IMPLANT
SUT PROLENE 6 0 BV (SUTURE) IMPLANT
SUT VIC AB 0 CT1 18XCR BRD8 (SUTURE) ×1 IMPLANT
SUT VIC AB 0 CT1 8-18 (SUTURE) ×1
SUT VIC AB 2-0 CT1 18 (SUTURE) ×1 IMPLANT
SUT VIC AB 3-0 SH 8-18 (SUTURE) ×1 IMPLANT
TOWEL GREEN STERILE (TOWEL DISPOSABLE) ×1 IMPLANT
TOWEL GREEN STERILE FF (TOWEL DISPOSABLE) ×1 IMPLANT
TRAY FOLEY MTR SLVR 14FR STAT (SET/KITS/TRAYS/PACK) IMPLANT
TRAY FOLEY MTR SLVR 16FR STAT (SET/KITS/TRAYS/PACK) ×1 IMPLANT
WATER STERILE IRR 1000ML POUR (IV SOLUTION) ×1 IMPLANT

## 2022-01-13 NOTE — Anesthesia Procedure Notes (Signed)
Procedure Name: Intubation Date/Time: 01/13/2022 10:17 AM  Performed by: Colin Benton, CRNAPre-anesthesia Checklist: Patient identified, Emergency Drugs available, Suction available and Patient being monitored Patient Re-evaluated:Patient Re-evaluated prior to induction Oxygen Delivery Method: Circle system utilized Preoxygenation: Pre-oxygenation with 100% oxygen Induction Type: IV induction Ventilation: Mask ventilation without difficulty Laryngoscope Size: Miller and 2 Tube type: Oral Tube size: 7.0 mm Number of attempts: 1 Airway Equipment and Method: Stylet Placement Confirmation: ETT inserted through vocal cords under direct vision, positive ETCO2 and breath sounds checked- equal and bilateral Secured at: 21 cm Tube secured with: Tape Dental Injury: Teeth and Oropharynx as per pre-operative assessment

## 2022-01-13 NOTE — Anesthesia Procedure Notes (Signed)
Arterial Line Insertion Start/End8/23/2023 8:10 AM, 01/13/2022 8:15 AM Performed by: Colin Benton, CRNA, CRNA  Patient location: Pre-op. Preanesthetic checklist: patient identified, IV checked, site marked, risks and benefits discussed, surgical consent, monitors and equipment checked, pre-op evaluation, timeout performed and anesthesia consent Lidocaine 1% used for infiltration Right, radial was placed Catheter size: 20 G Hand hygiene performed , maximum sterile barriers used  and Seldinger technique used Allen's test indicative of satisfactory collateral circulation Attempts: 1 Procedure performed without using ultrasound guided technique. Following insertion, dressing applied and Biopatch. Post procedure assessment: normal and unchanged  Patient tolerated the procedure well with no immediate complications.

## 2022-01-13 NOTE — H&P (Signed)
BP (!) 180/65   Pulse 93   Temp 97.8 F (36.6 C) (Oral)   Resp 18   Ht '4\' 10"'$  (1.473 m)   Wt 90.7 kg   SpO2 99%   BMI 41.80 kg/m  [name] is a patient of mine whom I took to the operating room for a lumbar fusion at L4-5 and L5-S1 in 2019.  She said she did well after that operation until May 2022 and at that time started to have pain in the left lower extremity, especially the lateral thigh.  She eventually was sent and seen at [facility] by Dr. Lavell Anchors.  She underwent a simple laminectomy at L3-4 for lumbar stenosis.  She returns today stating that the operation did not help.  She still has a significant amount of lower extremity pain.  She is using a walker and has used a walker since her operation on April 21, 2021. She states, and it is also clear, that she is unable to stand up straight comfortably.     VITAL SIGNS :  She is 4 feet 10 inches, weighs 200 pounds.  Temperature is 97.6, blood pressure is 184/102, pulse 88, respiratory rate 21.  Pain, she states, is 10/10.     She is 66 years of age.  She is disabled, right-handed.  She does not have a history of substance abuse, nor does she drink alcohol.  She has a medical history that consists of hypertension.  She is allergic to Sulfa-containing medications.  She does have a reaction of hives.  She takes Amlodipine, Metformin, Metoprolol, Celebrex, and Gabapentin.     She gave no information about her parents.     PHYSICAL EXAMINATION :  She is alert, oriented x4. She is in some mild distress.  She does walk stooped using a walker.  She has 5/5 strength in the upper and lower extremities.  2+ reflexes at the knees, 1+ at the ankles. 2+ biceps, triceps, brachioradialis.  Proprioception is intact in the lower extremities.  Romberg is negative.  Muscle tone and bulk are normal.  Pupils equal, round, and reactive to light.  Full extraocular movements.  Full visual fields.  Hearing intact to voice.  Uvula elevates in the  midline.  Shoulder shrug is normal.  Tongue protrudes in the midline.     MRI from June was done on an open scan and, unfortunately, in no way could give good detailed anatomical images.  She appears to have what looks like a very large disc herniation at 3-4.  She has had a laminectomy there, and I have the operative note from Dr. Katherine Roan.  Where she was operated on looks okay, and even the plain film today just shows marked degeneration between L3 and 4.   She returns today after a myelogram and post myelogram CT.  The most bizarre aspect of this is that the screws are loose at 4.  The screws are loose at 5 with halo surrounding both.  But, she has a chewed-up interspace with cystic changes, erosions and the like, where no work whatsoever looks like it has been done.  The disc at 3-4 is quite large.  I certainly believe this is the reason she is walking stooped with a walker and in pain.  We will get this scheduled for August.  I will change out the screws at 1 and at 4 and just connect the whole construct at the time of her operation.

## 2022-01-13 NOTE — Transfer of Care (Signed)
Immediate Anesthesia Transfer of Care Note  Patient: [name]  Procedure(s) Performed: LUMBAR THREE-FOUR  POSTERIOR LUMBAR INTERBODY FUSION, POSTERIOR ARTHRODESIS, REMOVAL OF HARDWARE AND EXTENSION OF FUSION (Spine Lumbar)  Patient Location: PACU  Anesthesia Type:General  Level of Consciousness: drowsy  Airway & Oxygen Therapy: Patient Spontanous Breathing and Patient connected to nasal cannula oxygen  Post-op Assessment: Report given to RN and Post -op Vital signs reviewed and stable  Post vital signs: Reviewed and stable  Last Vitals:  Vitals Value Taken Time  BP 153/65 01/13/22 1521  Temp    Pulse 106 01/13/22 1525  Resp 16 01/13/22 1525  SpO2 93 % 01/13/22 1525  Vitals shown include unvalidated device data.  Last Pain:  Vitals:   01/13/22 0707  TempSrc:   PainSc: 8       Patients Stated Pain Goal: 3 (49/35/52 1747)  Complications: No notable events documented.

## 2022-01-14 DIAGNOSIS — M4316 Spondylolisthesis, lumbar region: Secondary | ICD-10-CM

## 2022-01-14 NOTE — Care Management Obs Status (Signed)
[place] NOTIFICATION   Patient Details  Name: [name] MRN: [id number] Date of Birth: 06-10-1955   Medicare Observation Status Notification Given:  Yes    Sharin Mons, RN 01/14/2022, 3:15 PM

## 2022-01-14 NOTE — Anesthesia Postprocedure Evaluation (Signed)
Anesthesia Post Note  Patient: [name]  Procedure(s) Performed: LUMBAR THREE-FOUR  POSTERIOR LUMBAR INTERBODY FUSION, POSTERIOR ARTHRODESIS, REMOVAL OF HARDWARE AND EXTENSION OF FUSION (Spine Lumbar)     Patient location during evaluation: PACU Anesthesia Type: General Level of consciousness: awake and alert Pain management: pain level controlled Vital Signs Assessment: post-procedure vital signs reviewed and stable Respiratory status: spontaneous breathing, nonlabored ventilation, respiratory function stable and patient connected to nasal cannula oxygen Cardiovascular status: blood pressure returned to baseline and stable Postop Assessment: no apparent nausea or vomiting Anesthetic complications: no   No notable events documented.            Effie Berkshire

## 2022-01-14 NOTE — TOC Initial Note (Signed)
Transition of Care [facility]) - Initial/Assessment Note    Patient Details  Name: [name] MRN: [id number] Date of Birth: [birth year]  [facility] CM/SW Contact:    Sharin Mons, RN Phone Number: 01/14/2022, 3:23 PM  Clinical Narrative:                    s/p interbody lumbar fusion, 8/23 From home with spouse. States prior to surgery independent with ADL's , no DME usage. DME@ home, RW, shower chair.  PT/OT evaluations pending....  TOC team following and will assist with needs. Expected Discharge Plan: Home/Self Care Barriers to Discharge: Continued Medical Work up   Patient Goals and CMS Choice        Expected Discharge Plan and Services Expected Discharge Plan: Home/Self Care   Discharge Planning Services: CM Consult                                          Prior Living Arrangements/Services   Lives with:: Spouse Patient language and need for interpreter reviewed:: Yes Do you feel safe going back to the place where you live?: Yes      Need for Family Participation in Patient Care: Yes (Comment) Care giver support system in place?: Yes (comment) Current home services: DME (walker, showerchair) Criminal Activity/Legal Involvement Pertinent to Current Situation/Hospitalization: No - Comment as needed  Activities of Daily Living Home Assistive Devices/Equipment: Walker (specify type), Cane (specify quad or straight), Shower chair without back ADL Screening (condition at time of admission) Patient's cognitive ability adequate to safely complete daily activities?: Yes Is the patient deaf or have difficulty hearing?: No Does the patient have difficulty seeing, even when wearing glasses/contacts?: No Does the patient have difficulty concentrating, remembering, or making decisions?: No Patient able to express need for assistance with ADLs?: Yes Does the patient have difficulty dressing or bathing?: Yes Independently performs ADLs?: Yes  (appropriate for developmental age) Does the patient have difficulty walking or climbing stairs?: Yes Weakness of Legs: Both Weakness of Arms/Hands: None  Permission Sought/Granted   Permission granted to share information with : Yes, Verbal Permission Granted              Emotional Assessment Appearance:: Appears stated age Attitude/Demeanor/Rapport: Gracious Affect (typically observed): Accepting Orientation: : Oriented to Situation, Oriented to  Time, Oriented to Place, Oriented to Self Alcohol / Substance Use: Not Applicable Psych Involvement: No (comment)  Admission diagnosis:  Spondylolisthesis of lumbar region [M43.16] Lumbar adjacent segment disease with spondylolisthesis [M51.36, M43.16] Patient Active Problem List   Diagnosis Date Noted   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   S/P lumbar spinal fusion 11/17/2021   Lumbar radiculopathy 04/21/2021   Spondylolisthesis of lumbar region 02/01/2018   Gastroesophageal reflux disease without esophagitis 01/16/2018   Osteoarthritis of left hip 04/07/2014   BMI 40.0-44.9, adult ([place]) 04/07/2014   DM type 2 (diabetes mellitus, type 2) ([place])    HTN (hypertension)    Hypercholesterolemia    Reflux    PCP:  Chesley Noon, MD Pharmacy:   [place] [address] [place] [address] Phone: [phone] Fax: [phone]  [place], [place] [address] Phone: [phone] Fax: [place], [place] - [address] AT [facility]  [address] [place] [address] Phone: [phone] Fax: [phone]     Social Determinants of Health (SDOH) Interventions    Readmission Risk Interventions     No data to display

## 2022-01-14 NOTE — Progress Notes (Signed)
Called CNS to clarify bedrest vs PT/OT order and to clarify when to d/c foley.

## 2022-01-14 NOTE — Progress Notes (Signed)
Patient ID: [name], female   DOB: 02-24-1956, 66 y.o.   MRN: [id number] Alert and oriented x 4 Moving lower extremities well Dressing blood stained. BP 116/69 (BP Location: Right Arm)   Pulse (!) 103   Temp 98.2 F (36.8 C)   Resp 17   Ht '4\' 10"'$  (1.473 m)   Wt 90.7 kg   SpO2 100%   BMI 41.80 kg/m  PT tomorrow

## 2022-01-14 NOTE — Op Note (Signed)
01/13/2022  10:06 AM  PATIENT:  [name]  66 y.o. 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  Placement 7.57mscrews(nuvasive relign screws) at L3, and L4 Segmental pedicle screw fixation L3-S1  SURGEON:  Surgeon(s): CaAshok PallMD  ASSISTANTS:none  ANESTHESIA:   general  EBL:  Total I/O In: 0  Out: 750 [Urine:750]  BLOOD ADMINISTERED:none  CELL SAVER GIVEN:none  COUNT:per nursing  DRAINS: none   SPECIMEN:  No Specimen  DICTATION: ViCHANTALLE DEFILIPPOs a [age].o. female whom was taken to the operating room intubated, and placed under a general anesthetic without difficulty. A foley catheter was placed under sterile conditions. She was positioned prone on a Jackson table with all pressure points properly padded.  Her lumbar region was prepped and draped in a sterile manner. I infiltrated 10cc's 1/2%lidocaine/1:2000,000 strength epinephrine into the planned incision. I opened the skin with a 10 blade and took the incision down to the thoracolumbar fascia. I exposed the lamina of L3 in a subperiosteal fashion bilaterally. I confirmed my location with an intraoperative xray.  I placed self retaining retractors and started the decompression.  I decompressed the spinal canal at L3/4 via a laminectomy of L3 in excess of the needed exposure for a PLIF. PLIF's were performed at L3/4 in the same fashion. I opened the disc space with a 15 blade then used a variety of instruments to remove the disc and prepare the space for the arthrodesis. I  used curettes, rongeurs, punches, shavers for the disc space, and rasps in the discetomy. I measured the disc space and placed titanium Cascadia cages(Stryker) into the disc space(s).  I placed pedicle screws at L3,L4, and S1. The L4 and S1 screws were removed as there were significant halos surrounding all 4 screws. I placed 7.64m82mcrews at L4, and S1.I placed L3 screws using fluoroscopic guidance. I drilled a pilot hole, then cannulated the pedicle with a drill at each site. I then tapped each pedicle, assessing each site for pedicle violations. No cutouts were appreciated. Screws (NuHarlin Heysere then placed at each site without difficulty. I attached rods and locking caps with the appropriate tools. The locking caps were secured with torque limited screwdrivers. Final films were performed and the final construct appeared to be in good position.  I closed the wound in a layered fashion. i approximated the thoracolumbar fascia, subcutaneous, and subcuticular planes with vicryl sutures. I used dermabond, and an occlusive bandage for a sterile dressing.     PLAN OF CARE: Admit to inpatient   PATIENT DISPOSITION:  PACU - hemodynamically stable.   Delay start of Pharmacological VTE agent (>24hrs) due to surgical blood loss or risk of bleeding:  yes

## 2022-01-14 NOTE — Progress Notes (Signed)
OT Cancellation Note  Patient Details Name: ASMI FUGERE MRN: [id number] DOB: 03/09/1956   Cancelled Treatment:    Reason Eval/Treat Not Completed: Active bedrest order Per order set, pt on strict bedrest for 24 hours beginning 8/23 at 1811. Will hold OT attempts unless otherwise directed by MD.  Layla Maw 01/14/2022, 7:01 AM

## 2022-01-14 NOTE — Plan of Care (Signed)

## 2022-01-14 NOTE — Progress Notes (Signed)
PT Cancellation Note  Patient Details Name: [name] MRN: [id number] DOB: [birth year]   Cancelled Treatment:    Reason Eval/Treat Not Completed: Active bedrest order Per order set, pt on strict bedrest for 24 hours beginning 8/23 at 1811. Will hold PT attempts unless otherwise directed by MD  Vonzell Lindblad A. [name] PT, DPT Acute Rehabilitation Services Office [phone]   [name] 01/14/2022, 8:07 AM

## 2022-01-14 NOTE — Care Management CC44 (Signed)
Condition Code 44 Documentation Completed  Patient Details  Name: [name] MRN: [id number] Date of Birth: 09-10-1955   Condition Code 44 given:  Yes Patient signature on Condition Code 44 notice:  Yes Documentation of 2 MD's agreement:  Yes Code 44 added to claim:  Yes    Sharin Mons, RN 01/14/2022, 3:15 PM

## 2022-01-15 DIAGNOSIS — M4316 Spondylolisthesis, lumbar region: Secondary | ICD-10-CM | POA: Diagnosis not present

## 2022-01-15 MED FILL — Sodium Chloride IV Soln 0.9%: INTRAVENOUS | Qty: 2000 | Status: AC

## 2022-01-15 MED FILL — Heparin Sodium (Porcine) Inj 1000 Unit/ML: INTRAMUSCULAR | Qty: 30 | Status: AC

## 2022-01-15 NOTE — Progress Notes (Signed)
Patient assisted up to [facility]. Honeycomb dressing has been saturated all shift but is now oozing below the dressing and dressing is peeling away from skin. Blood noted on bed pad as well.  Honeycomb dressing changed and reinforced below dressing with an abd pad to absorb drainage.

## 2022-01-15 NOTE — Progress Notes (Signed)
Patient ID: [name], female   DOB: [birth year], 66 y.o.   MRN: [id number] BP 120/71 (BP Location: Right Arm)   Pulse (!) 103   Temp 98.7 F (37.1 C) (Oral)   Resp 16   Ht '4\' 10"'$  (1.473 m)   Wt 90.7 kg   SpO2 99%   BMI 41.80 kg/m  Alert and oriented x 4 Moving all extremities States right lower extremity is weak. Wound with some sanguineous drainage. Needs more time with PT

## 2022-01-15 NOTE — Evaluation (Signed)
Occupational Therapy Evaluation Patient Details Name: [name] MRN: [id number] DOB: 01/14/1956 Today's Date: 01/15/2022   History of Present Illness Pt is a 66 y/o female s/p L3-L4 PLIF and posterior arthrodesis L3-S1. Pt has PMH of moderate aortic stenosis, anxiety, asthma, obesity, diabetes mellitus type 2, GERD, hypertension, and hyperlipidemia.   Clinical Impression   Pt presents with the above diagnosis. Pt provided with back precaution handout and was able to recall 2/3 of BLT precautions. Pt currently requires MOD A +2 for managing buckling of R knee during functional transfers/transitional movements during ADL with RW and MAX A for all LB ADLs. Pt lives with her spouse who is available intermittently throughout the day, but completes IADLS for the household. Pt would benefit from continued services to maximize safety and independence with transfers and ADLs prior to returning home. Recommendation for for CIR.     Recommendations for follow up therapy are one component of a multi-disciplinary discharge planning process, led by the attending physician.  Recommendations may be updated based on patient status, additional functional criteria and insurance authorization.   Follow Up Recommendations  Skilled nursing-short term rehab (<3 hours/day)    Assistance Recommended at Discharge Intermittent Supervision/Assistance  Patient can return home with the following A lot of help with walking and/or transfers;A lot of help with bathing/dressing/bathroom;Assistance with cooking/housework;Assist for transportation;Help with stairs or ramp for entrance    Functional Status Assessment  Patient has had a recent decline in their functional status and demonstrates the ability to make significant improvements in function in a reasonable and predictable amount of time.  Equipment Recommendations  BSC/3in1    Recommendations for Other Services Rehab consult     Precautions / Restrictions  Precautions Precautions: Back;Fall Precaution Booklet Issued: Yes (comment) Precaution Comments: RLE buckles in stance Restrictions Weight Bearing Restrictions: No       Mobility   Transfers Overall transfer level: Needs assistance     Sit to Stand: Mod assist, From elevated surface, +2 physical assistance     Step pivot transfers: Mod assist, +2 physical assistance     General transfer comment: MOD A sit to stand from elevated BSC and recliner. Pt with RLE buckling in stance during pivot steps, cuing requried for hand placement      Balance Overall balance assessment: Needs assistance   Sitting balance-[name]: Good     Standing balance support: Reliant on assistive device for balance, Bilateral upper extremity supported Standing balance-[name]: Poor                             ADL either performed or assessed with clinical judgement   ADL Overall ADL's : Needs assistance/impaired Eating/Feeding: Set up;Sitting   Grooming: Wash/dry hands;Min guard Grooming Details (indicate cue type and reason): A to maintain upright sitting from recliner back Upper Body Bathing: Supervision/ safety;Sitting   Lower Body Bathing: Maximal assistance;Sit to/from stand   Upper Body Dressing : Supervision/safety;Sitting   Lower Body Dressing: Maximal assistance;Sit to/from stand   Toilet Transfer: +2 for physical assistance;Moderate assistance   Toileting- Clothing Manipulation and Hygiene: +2 for physical assistance;Maximal assistance               Vision Baseline Vision/History: 0 No visual deficits Ability to See in Adequate Light: 0 Adequate Patient Visual Report: No change from baseline Vision Assessment?: No apparent visual deficits            Pertinent Vitals/Pain Pain  Assessment Pain Assessment: No/denies pain Pain Score: 5  Pain Location: lumbar Pain Descriptors / Indicators: Dull Pain Intervention(s): Premedicated before session,  Repositioned     Hand Dominance Right   Extremity/Trunk Assessment Upper Extremity Assessment Upper Extremity Assessment: Overall WFL for tasks assessed   Lower Extremity Assessment Lower Extremity Assessment: RLE deficits/detail RLE Deficits / Details: RLE buckling in stance intermittently during transfers   Cervical / Trunk Assessment Cervical / Trunk Assessment: Back Surgery   Communication Communication Communication: No difficulties   Cognition Arousal/Alertness: Awake/alert Behavior During Therapy: WFL for tasks assessed/performed Overall Cognitive Status: Within Functional Limits for tasks assessed                                 General Comments: A and O x 4     General Comments  HR and O2 stable >98% on RA            Home Living Family/patient expects to be discharged to:: Private residence Living Arrangements: Spouse/significant other Available Help at Discharge: Family;Available PRN/intermittently Type of Home: House Home Access: Level entry     Home Layout: Two level;Other (Comment) Alternate Level Stairs-Number of Steps: 13 Alternate Level Stairs-Rails: Left Bathroom Shower/Tub: Occupational psychologist: Standard     Home Equipment: Conservation officer, nature (2 wheels);Rollator (4 wheels);Cane - single point;Shower seat;BSC/3in1          Prior Functioning/Environment Prior Level of Function : Independent/Modified Independent;Driving             Mobility Comments: Pt ambulates mostly with SPC at baseline but uses rollator for long distances. However, pt reports mostly ambulating only short distances within her home. ADLs Comments: Pt independent with bathing and dressing. Pt does not go to grocery store (husband takes care of). Pt does not do house chores. Pt able to independently manage meds and finances. Pt does not cook.        OT Problem List: Decreased strength;Decreased coordination;Cardiopulmonary status limiting  activity;Pain;Decreased activity tolerance;Decreased safety awareness;Obesity;Impaired balance (sitting and/or standing)      OT Treatment/Interventions: Self-care/ADL training;Therapeutic exercise;Neuromuscular education;Energy conservation;DME and/or AE instruction;Therapeutic activities;Patient/family education;Balance training    OT Goals(Current goals can be found in the care plan section) Acute Rehab OT Goals Patient Stated Goal: I want to be able to go home and take care of myself OT Goal Formulation: With patient Potential to Achieve Goals: Good  OT Frequency: Min 2X/week       AM-PAC OT "6 Clicks" Daily Activity     Outcome Measure Help from another person eating meals?: None Help from another person taking care of personal grooming?: A Little Help from another person toileting, which includes using toliet, bedpan, or urinal?: Total Help from another person bathing (including washing, rinsing, drying)?: A Lot Help from another person to put on and taking off regular upper body clothing?: A Little Help from another person to put on and taking off regular lower body clothing?: Total 6 Click Score: 14   End of Session Equipment Utilized During Treatment: Gait belt;Rolling walker (2 wheels) Nurse Communication: Mobility status (use of stedy for toileting)  Activity Tolerance: Patient tolerated treatment well Patient left: in chair;with call bell/phone within reach;with chair alarm set  OT Visit Diagnosis: Unsteadiness on feet (R26.81);Other abnormalities of gait and mobility (R26.89);Muscle weakness (generalized) (M62.81);Pain Pain - part of body:  (back)  Time: 4099-2780 OT Time Calculation (min): 20 min Charges:  OT General Charges $OT Visit: 1 Visit OT Evaluation $OT Eval Moderate Complexity: 1 Mod  [address] MOTR/L  Tonny Branch 01/15/2022, 10:25 AM

## 2022-01-15 NOTE — Evaluation (Addendum)
Physical Therapy Evaluation Patient Details Name: [name] MRN: [id number] DOB: [birth year] Today's Date: 01/15/2022  History of Present Illness  Pt is a 65 y/o female s/p L3-L4 PLIF and posterior arthrodesis L3-S1. Pt has PMH of moderate aortic stenosis, anxiety, asthma, obesity, diabetes mellitus type 2, GERD, hypertension, and hyperlipidemia.  Clinical Impression  Pt agreeable to physical therapy evaluation/treatment session. Pt performing bed mobility with supervision and sit to stands with moderate assistance (multiple attempts needed). Pt with significant R LE weakness. Pt able to complete step pivot transfer from bed to chair with RW and min assist. Deferred ambulating greater distance due to pt's lethargy and weakness. Pt currently presents with functional limitations secondary to impairments listed in PT problem list. Pt to benefit from skilled, acute care physical therapy interventions to maximize her independence level and quality of life.       Recommendations for follow up therapy are one component of a multi-disciplinary discharge planning process, led by the attending physician.  Recommendations may be updated based on patient status, additional functional criteria and insurance authorization.  Follow Up Recommendations Other (comment) (pending progress)      Assistance Recommended at Discharge Frequent or constant Supervision/Assistance  Patient can return home with the following  A little help with walking and/or transfers;A little help with bathing/dressing/bathroom;Assistance with cooking/housework;Assist for transportation;Help with stairs or ramp for entrance    Equipment Recommendations Wheelchair cushion (measurements PT);Wheelchair (measurements PT)  Recommendations for Other Services       Functional Status Assessment Patient has had a recent decline in their functional status and demonstrates the ability to make significant improvements in function in a  reasonable and predictable amount of time.     Precautions / Restrictions Precautions Precautions: Back;Fall Precaution Booklet Issued: Yes (comment) Precaution Comments: RLE buckles in stance Restrictions Weight Bearing Restrictions: No      Mobility  Bed Mobility Overal bed mobility: Needs Assistance Bed Mobility: Supine to Sit, Rolling Rolling: Supervision   Supine to sit: Supervision     General bed mobility comments: Cues for sequencing and technique maintaining spine precautions    Transfers Overall transfer level: Needs assistance Equipment used: Rolling walker (2 wheels) Transfers: Sit to/from Stand, Bed to chair/wheelchair/BSC Sit to Stand: Mod assist, From elevated surface   Step pivot transfers: Min assist       General transfer comment: Pt required three attempts to complete sit to stand with bed elevated further each time. Pt with unsteadiness. Pt required cues for hand placements and technique including use of rocking strategy. Pt with R LE buckling x 1 while performing side steps at side of bed prior to step pivot transfer.    Ambulation/Gait Ambulation/Gait assistance: Min assist Gait Distance (Feet): 5 Feet Assistive device: Rolling walker (2 wheels) Gait Pattern/deviations: Step-to pattern, Antalgic, Trunk flexed, Decreased step length - left, Decreased step length - right Gait velocity: decreased Gait velocity interpretation: <1.31 ft/sec, indicative of household ambulator   General Gait Details: Pt performed gait for short distance from bed to chair during step pivot transfer. Deferred greater distance due to pt's lethargy and R LE weakness.  Stairs            Wheelchair Mobility    Modified Rankin (Stroke Patients Only)       Balance Overall balance assessment: Needs assistance   Sitting balance-[name]: Good     Standing balance support: Reliant on assistive device for balance, Bilateral upper extremity supported Standing  balance-[name]: Poor  Pertinent Vitals/Pain Pain Assessment Pain Assessment: 0-10 Pain Score: 5  Pain Location: lumbar Pain Descriptors / Indicators: Dull Pain Intervention(s): Premedicated before session, Monitored during session, Limited activity within patient's tolerance    Home Living Family/patient expects to be discharged to:: Private residence Living Arrangements: Spouse/significant other Available Help at Discharge: Family;Available PRN/intermittently Type of Home: House Home Access: Level entry     Alternate Level Stairs-Number of Steps: 13 Home Layout: Two level;Other (Comment) Home Equipment: Rolling Walker (2 wheels);Rollator (4 wheels);Cane - single point;Shower seat;BSC/3in1      Prior Function Prior Level of Function : Independent/Modified Independent;Driving             Mobility Comments: Pt ambulates mostly with SPC at baseline but uses rollator for long distances. However, pt reports mostly ambulating only short distances within her home. ADLs Comments: Pt independent with bathing and dressing. Pt does not go to grocery store (husband takes care of). Pt does not do house chores. Pt able to independently manage meds and finances. Pt does not cook.     Hand Dominance   Dominant Hand: Right    Extremity/Trunk Assessment   Upper Extremity Assessment Upper Extremity Assessment: Overall WFL for tasks assessed    Lower Extremity Assessment Lower Extremity Assessment: RLE deficits/detail RLE Deficits / Details: RLE buckling in stance intermittently during transfers    Cervical / Trunk Assessment Cervical / Trunk Assessment: Back Surgery  Communication   Communication: No difficulties  Cognition Arousal/Alertness: Lethargic (stays awake with cues) Behavior During Therapy: WFL for tasks assessed/performed Overall Cognitive Status: Within Functional Limits for tasks assessed                                  General Comments: A and O x 4        General Comments General comments (skin integrity, edema, etc.): HR and O2 stable >98% on RA    Exercises     Assessment/Plan    PT Assessment Patient needs continued PT services  PT Problem List Decreased strength;Decreased activity tolerance;Decreased balance;Decreased mobility;Decreased knowledge of precautions;Obesity;Pain       PT Treatment Interventions DME instruction;Gait training;Stair training;Functional mobility training;Therapeutic activities;Therapeutic exercise;Balance training;Patient/family education;Modalities    PT Goals (Current goals can be found in the Care Plan section)  Acute Rehab PT Goals Patient Stated Goal: none stated PT Goal Formulation: With patient Time For Goal Achievement: 01/21/22 Potential to Achieve Goals:  (fairly good)    Frequency Min 5X/week     Co-evaluation               AM-PAC PT "6 Clicks" Mobility  Outcome Measure Help needed turning from your back to your side while in a flat bed without using bedrails?: A Little Help needed moving from lying on your back to sitting on the side of a flat bed without using bedrails?: A Little Help needed moving to and from a bed to a chair (including a wheelchair)?: A Lot Help needed standing up from a chair using your arms (e.g., wheelchair or bedside chair)?: A Lot Help needed to walk in hospital room?: A Lot Help needed climbing 3-5 steps with a railing? : A Lot 6 Click Score: 14    End of Session Equipment Utilized During Treatment: Gait belt Activity Tolerance: Patient limited by lethargy;Patient limited by pain Patient left: in chair;with call bell/phone within reach;with chair alarm set Nurse Communication: Mobility status;Precautions PT Visit Diagnosis: Other  abnormalities of gait and mobility (R26.89);Muscle weakness (generalized) (M62.81);Pain Pain - part of body:  (lumbar)    Time: 0900-0930 PT Time Calculation (min)  (ACUTE ONLY): 30 min   Charges:   PT Evaluation $PT Eval Low Complexity: 1 Low PT Treatments $Therapeutic Activity: 8-22 mins        Donna Bernard, PT   [facility] 01/15/2022, 10:45 AM

## 2022-01-16 DIAGNOSIS — M4316 Spondylolisthesis, lumbar region: Secondary | ICD-10-CM | POA: Diagnosis not present

## 2022-01-16 NOTE — Progress Notes (Addendum)
Physical Therapy Treatment Patient Details Name: [name] MRN: [id number] DOB: 22-Nov-1955 Today's Date: 01/16/2022   History of Present Illness Pt is a 66 y/o female s/p L3-L4 PLIF and posterior arthrodesis L3-S1. Pt has PMH of moderate aortic stenosis, anxiety, asthma, obesity, diabetes mellitus type 2, GERD, hypertension, and hyperlipidemia.    PT Comments    Pt received in supine, agreeable to therapy session and with good participation and tolerance for bed mobility, transfer training and gait training with chair follow for safety. Pt needing up to modA lift assist and minA +2 for safety for gait progression. 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 Discussed with supervising PT Ryan L and updated disposition below. Pt continues to benefit from PT services to progress toward functional mobility goals.    Recommendations for follow up therapy are one component of a multi-disciplinary discharge planning process, led by the attending physician.  Recommendations may be updated based on patient status, additional functional criteria and insurance authorization.  Follow Up Recommendations  Skilled nursing-short term rehab (<3 hours/day) (pt agreeable)     Assistance Recommended at Discharge Frequent or constant Supervision/Assistance  Patient can return home with the following A little help with bathing/dressing/bathroom;Assistance with cooking/housework;Assist for transportation;Help with stairs or ramp for entrance;A lot of help with walking and/or transfers   Equipment Recommendations  Wheelchair cushion (measurements PT);Wheelchair (measurements PT);Other (comment) (pending progress)    Recommendations for Other Services       Precautions / Restrictions Precautions Precautions:  Back;Fall Precaution Booklet Issued:  (previously given) Precaution Comments: hx RLE buckling (not today) Restrictions Weight Bearing Restrictions: No     Mobility  Bed Mobility Overal bed mobility: Needs Assistance Bed Mobility: Rolling, Sidelying to Sit, Sit to Sidelying Rolling: Supervision Sidelying to sit: Min assist     Sit to sidelying: Min assist General bed mobility comments: Cues for sequencing and technique maintaining spine precautions with physical assistance to guide trunk upright into sitting, BLE assist to return tos upine; heavy use of bed rails    Transfers Overall transfer level: Needs assistance Equipment used: Rolling walker (2 wheels) Transfers: Sit to/from Stand, Bed to chair/wheelchair/BSC Sit to Stand: Mod assist           General transfer comment: mod a sit/stand from EOB<>RW, cues for safer UE placement    Ambulation/Gait Ambulation/Gait assistance: Min assist, +2 safety/equipment Gait Distance (Feet): 65 Feet (chair follow) Assistive device: Rolling walker (2 wheels) Gait Pattern/deviations: Step-to pattern, Antalgic, Trunk flexed, Decreased step length - left, Decreased step length - right       General Gait Details: chair follow for safety; cues for upright posture and sequencing, good carryover; no buckling today   Stairs             Wheelchair Mobility    Modified Rankin (Stroke Patients Only)       Balance Overall balance assessment: Needs assistance Sitting-balance support: No upper extremity supported Sitting balance-[name]: Good     Standing balance support: Reliant on assistive device for balance, Bilateral upper extremity supported Standing balance-[name]: Poor Standing balance comment: reliant on RW                            Cognition Arousal/Alertness: Awake/alert Behavior During Therapy: WFL for tasks assessed/performed Overall  Cognitive Status: Within Functional Limits for tasks  assessed                                 General Comments: A and O x 4, at times slow response to questions        Exercises Other Exercises Other Exercises: seated BLE AROM: heel/toe raises, LAQ x10 reps ea    General Comments General comments (skin integrity, edema, etc.): BP 124/80 seated, no dizziness in stance; HR 115 to 126 bpm with exertion, SpO2 98% on RA      Pertinent Vitals/Pain Pain Assessment Pain Assessment: 0-10 Pain Score: 5  Pain Location: lumbar Pain Descriptors / Indicators: Aching Pain Intervention(s): Monitored during session, Repositioned, Premedicated before session, Limited activity within patient's tolerance           PT Goals (current goals can now be found in the care plan section) Acute Rehab PT Goals Patient Stated Goal: "to be able to take care of myself and be independent" PT Goal Formulation: With patient Time For Goal Achievement: 01/21/22 Progress towards PT goals: Progressing toward goals    Frequency    Min 5X/week      PT Plan Current plan remains appropriate       AM-PAC PT "6 Clicks" Mobility   Outcome Measure  Help needed turning from your back to your side while in a flat bed without using bedrails?: A Little Help needed moving from lying on your back to sitting on the side of a flat bed without using bedrails?: A Lot (reliant on bed rails and HOB up) Help needed moving to and from a bed to a chair (including a wheelchair)?: A Lot Help needed standing up from a chair using your arms (e.g., wheelchair or bedside chair)?: A Lot Help needed to walk in hospital room?: A Lot Help needed climbing 3-5 steps with a railing? : Total 6 Click Score: 12    End of Session Equipment Utilized During Treatment: Gait belt Activity Tolerance: Patient tolerated treatment well;Patient limited by pain Patient left: in bed;with call bell/phone within reach;with bed alarm set Nurse Communication: Mobility  status;Precautions PT Visit Diagnosis: Other abnormalities of gait and mobility (R26.89);Muscle weakness (generalized) (M62.81);Pain Pain - part of body:  (back)     Time: 6568-1275 PT Time Calculation (min) (ACUTE ONLY): 31 min  Charges:  $Gait Training: 8-22 mins $Therapeutic Activity: 8-22 mins                     Wanette Robison P., PTA Acute Rehabilitation Services Secure Chat Preferred 9a-5:30pm Office: [place] 01/16/2022, 5:29 PM

## 2022-01-16 NOTE — Progress Notes (Signed)
Occupational Therapy Treatment Patient Details Name: [name] MRN: [id number] DOB: [birth year] Today's Date: 01/16/2022   History of present illness Pt is a 66 y/o female s/p L3-L4 PLIF and posterior arthrodesis L3-S1. Pt has PMH of moderate aortic stenosis, anxiety, asthma, obesity, diabetes mellitus type 2, GERD, hypertension, and hyperlipidemia.   OT comments  Pt. Seen for skilled OT treatment session.  Pt. Able to complete bed mobility with focus on log roll and integration of spinal precautions with mod a.  In room ambulation for simulated toileting task with min a with 2nd person assisting with following with a chair but not requiring 2 person physical assistance during mobility.  Pt. Tolerated well.  Husband present and assisted with session and eager to help and support pt. As needed.    Recommendations for follow up therapy are one component of a multi-disciplinary discharge planning process, led by the attending physician.  Recommendations may be updated based on patient status, additional functional criteria and insurance authorization.    Follow Up Recommendations  Skilled nursing-short term rehab (<3 hours/day)    Assistance Recommended at Discharge Intermittent Supervision/Assistance  Patient can return home with the following  A lot of help with walking and/or transfers;A lot of help with bathing/dressing/bathroom;Assistance with cooking/housework;Assist for transportation;Help with stairs or ramp for entrance   Equipment Recommendations  BSC/3in1    Recommendations for Other Services Rehab consult    Precautions / Restrictions Precautions Precautions: Back;Fall Precaution Comments: RLE buckles in stance       Mobility Bed Mobility Overal bed mobility: Needs Assistance Bed Mobility: Rolling, Sidelying to Sit Rolling: Supervision Sidelying to sit: Mod assist       General bed mobility comments: Cues for sequencing and technique maintaining spine  precautions with physical assistance to guide trunk upright into sitting    Transfers Overall transfer level: Needs assistance Equipment used: Rolling walker (2 wheels) Transfers: Sit to/from Stand, Bed to chair/wheelchair/BSC Sit to Stand: Mod assist, From elevated surface     Step pivot transfers: Min assist, +2 safety/equipment     General transfer comment: mod a sit/stand from eob, once up min a with x2 for having chair behind pt. if needed. pt. states no feeling of knee buckling but feels like shes not taking "acutal steps i feel like im just dragging this leg/foot" in regards to rle     Balance                                           ADL either performed or assessed with clinical judgement   ADL Overall ADL's : Needs assistance/impaired                         Toilet Transfer: Minimal assistance;+2 for safety/equipment;Cueing for safety;Cueing for sequencing;Ambulation;Rolling walker (2 wheels) Toilet Transfer Details (indicate cue type and reason): pts. husband present and assisted with following with the recliner during in room ambulation for simulated toileting task.  pt. ambulated eob, around the bed to recliner both reported farther than b.room distance at home         Functional mobility during ADLs: Minimal assistance;+2 for safety/equipment General ADL Comments: no buckling of R knee duirng session or during any ambulation during session.  husband very supportive and active in her care. he assisted pushing recliner behind pt. but it was not needed  Extremity/Trunk Assessment              Vision       Perception     Praxis      Cognition Arousal/Alertness: Awake/alert Behavior During Therapy: WFL for tasks assessed/performed Overall Cognitive Status: Within Functional Limits for tasks assessed                                          Exercises      Shoulder Instructions       General Comments       Pertinent Vitals/ Pain       Pain Assessment Pain Assessment: 0-10 Pain Score: 5  Pain Location: lumbar Pain Descriptors / Indicators: Aching Pain Intervention(s): Limited activity within patient's tolerance, Monitored during session, Repositioned  Home Living                                          Prior Functioning/Environment              Frequency  Min 2X/week        Progress Toward Goals  OT Goals(current goals can now be found in the care plan section)  Progress towards OT goals: Progressing toward goals     Plan Discharge plan remains appropriate    Co-evaluation                 AM-PAC OT "6 Clicks" Daily Activity     Outcome Measure   Help from another person eating meals?: None Help from another person taking care of personal grooming?: A Little Help from another person toileting, which includes using toliet, bedpan, or urinal?: Total Help from another person bathing (including washing, rinsing, drying)?: A Lot Help from another person to put on and taking off regular upper body clothing?: A Little Help from another person to put on and taking off regular lower body clothing?: Total 6 Click Score: 14    End of Session Equipment Utilized During Treatment: Gait belt;Rolling walker (2 wheels)  OT Visit Diagnosis: Unsteadiness on feet (R26.81);Other abnormalities of gait and mobility (R26.89);Muscle weakness (generalized) (M62.81);Pain   Activity Tolerance Patient tolerated treatment well   Patient Left in chair;with call bell/phone within reach;with chair alarm set;with family/visitor present   Nurse Communication          Time: 1448-1856 OT Time Calculation (min): 33 min  Charges: OT General Charges $OT Visit: 1 Visit OT Treatments $Self Care/Home Management : 23-37 mins  Sonia Baller, COTA/L Acute Rehabilitation [phone]   Tanya Nones 01/16/2022, 12:44 PM

## 2022-01-16 NOTE — Progress Notes (Signed)
Subjective: Patient reports a reduction of her preoperative LLE radiculopathy. She is pleased with her postoperative status thus far. NAE ON.  Objective: Vital signs in last 24 hours: Temp:  [98.2 F (36.8 C)-99.6 F (37.6 C)] 98.2 F (36.8 C) (08/26 0807) Pulse Rate:  [98-109] 101 (08/26 0807) Resp:  [16-18] 18 (08/26 0807) BP: (120-132)/(61-71) 126/61 (08/26 0807) SpO2:  [94 %-99 %] 99 % (08/26 0807)  Intake/Output from previous day: 08/25 0701 - 08/26 0700 In: 843 [P.O.:840; I.V.:3] Out: -  Intake/Output this shift: Total I/O In: 303 [P.O.:300; I.V.:3] Out: -   Physical Exam: Patient is awake, A/O X 4, conversant, and in good spirits. Eyes open spontaneously. They are in NAD and VSS. Speech is fluent and appropriate. MAEW. Sensation to light touch is intact. PERLA, EOMI. CNs grossly intact. Dressing is intact with minimal serosanguineous drainage. Incision is well approximated with no erythema or fluctuance    Lab Results: No results for input(s): "WBC", "HGB", "HCT", "PLT" in the last 72 hours. BMET No results for input(s): "NA", "K", "CL", "CO2", "GLUCOSE", "BUN", "CREATININE", "CALCIUM" in the last 72 hours.  Studies/Results: No results found.  Assessment/Plan: 66 y.o. female who is s/p L3/4 PLIF with revision of hardware at L4, S1 by Dr. Christella Noa on 01/14/2022. She is recovering well. LLE radiculopathy improved. Appropriate surgical pain. Neuro exam is stable. She has worked with therapies. PT recommendations pending progress. OT recommending SNF.     LOS: 1 day     Marvis Moeller, DNP, AGNP-C Neurosurgery Nurse Practitioner  [facility] [place] [address], [place], [place] [address] P: [phone]    F: [phone]  01/16/2022 11:19 AM

## 2022-01-17 DIAGNOSIS — M4316 Spondylolisthesis, lumbar region: Secondary | ICD-10-CM | POA: Diagnosis not present

## 2022-01-17 NOTE — NC FL2 (Signed)
[place] LEVEL OF CARE SCREENING TOOL     IDENTIFICATION  Patient Name: [name] Birthdate: 10-Jun-1955 Sex: female Admission Date (Current Location): 01/13/2022  [facility] and [name] Number:  Herbalist and Address:  [facility], [place] [address], [place], [place] [address]      Provider Number: [id number]  Attending Physician Name and Address:  Ashok Pall, MD  Relative Name and Phone Number:       Current Level of Care: Hospital Recommended Level of Care: [place] Prior Approval Number:    Date Approved/Denied:   PASRR Number: [id number]  Discharge Plan: SNF    Current Diagnoses: Patient Active Problem List   Diagnosis Date Noted   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   S/P lumbar spinal fusion 11/17/2021   Lumbar radiculopathy 04/21/2021   Spondylolisthesis of lumbar region 02/01/2018   Gastroesophageal reflux disease without esophagitis 01/16/2018   Osteoarthritis of left hip 04/07/2014   BMI 40.0-44.9, adult ([place]) 04/07/2014   DM type 2 (diabetes mellitus, type 2) (HCC)    HTN (hypertension)    Hypercholesterolemia    Reflux     Orientation RESPIRATION BLADDER Height & Weight     Self, Time, Situation, Place  Normal Continent Weight: 200 lb (90.7 kg) Height:  '4\' 10"'$  (147.3 cm)  BEHAVIORAL SYMPTOMS/MOOD NEUROLOGICAL BOWEL NUTRITION STATUS      Continent Diet (Diet Carb Modified Fluid consistency: Thin)  AMBULATORY STATUS COMMUNICATION OF NEEDS Skin   Limited Assist Verbally Surgical wounds                       Personal Care Assistance Level of Assistance  Bathing, Dressing Bathing Assistance: Limited assistance   Dressing Assistance: Limited assistance     Functional Limitations Info             SPECIAL CARE FACTORS FREQUENCY  PT (By licensed PT), OT (By licensed OT)     PT Frequency: 5 OT Frequency: 5            Contractures  Contractures Info: Not present    Additional Factors Info  Allergies   Allergies Info: Methocarbamol, Simvastatin, Sulfa ABX, Rosuvastatin, Atorvastatin           Current Medications (01/17/2022):  This is the current hospital active medication list Current Facility-Administered Medications  Medication Dose Route Frequency Provider Last Rate Last Admin   0.9 %  sodium chloride infusion  250 mL Intravenous Continuous Cabbell, Marylyn Ishihara, MD       0.9 % NaCl with KCl 20 mEq/ L  infusion   Intravenous Continuous Ashok Pall, MD 80 mL/hr at 01/13/22 2030 New Bag at 01/13/22 2030   acetaminophen (TYLENOL) tablet 650 mg  650 mg Oral Q4H PRN Ashok Pall, MD   650 mg at 01/16/22 9678   Or   acetaminophen (TYLENOL) suppository 650 mg  650 mg Rectal Q4H PRN Ashok Pall, MD       albuterol (PROVENTIL) (2.5 MG/3ML) 0.083% nebulizer solution 3 mL  3 mL Inhalation Q6H PRN Ashok Pall, MD       amLODipine (NORVASC) tablet 10 mg  10 mg Oral Daily Ashok Pall, MD   10 mg at 01/17/22 1004   bisacodyl (DULCOLAX) EC tablet 5 mg  5 mg Oral Daily PRN Ashok Pall, MD       celecoxib (CELEBREX) capsule 200 mg  200 mg Oral BID Ashok Pall, MD   200  mg at 01/17/22 1004   cyanocobalamin (VITAMIN B12) tablet 1,000 mcg  1,000 mcg Oral Daily Ashok Pall, MD   1,000 mcg at 01/17/22 1004   diazepam (VALIUM) tablet 5 mg  5 mg Oral Q6H PRN Ashok Pall, MD   5 mg at 01/14/22 0122   docusate sodium (COLACE) capsule 100 mg  100 mg Oral BID Ashok Pall, MD   100 mg at 01/17/22 1004   gabapentin (NEURONTIN) tablet 600 mg  600 mg Oral TID Ashok Pall, MD   600 mg at 01/17/22 1004   heparin injection 5,000 Units  5,000 Units Subcutaneous Q8H Ashok Pall, MD   5,000 Units at 01/17/22 0646   losartan (COZAAR) tablet 100 mg  100 mg Oral Daily Ashok Pall, MD   100 mg at 01/17/22 1004   And   hydrochlorothiazide (HYDRODIURIL) tablet 25 mg  25 mg Oral Daily Ashok Pall, MD   25 mg at 01/17/22 1004   iron  polysaccharides (NIFEREX) capsule 150 mg  150 mg Oral Daily Ashok Pall, MD   150 mg at 01/17/22 1004   loratadine (CLARITIN) tablet 10 mg  10 mg Oral Daily Ashok Pall, MD   10 mg at 01/17/22 1004   menthol-cetylpyridinium (CEPACOL) lozenge 3 mg  1 lozenge Oral PRN Ashok Pall, MD       Or   phenol (CHLORASEPTIC) mouth spray 1 spray  1 spray Mouth/Throat PRN Ashok Pall, MD       metFORMIN (GLUCOPHAGE) tablet 1,000 mg  1,000 mg Oral BID WC Ashok Pall, MD   1,000 mg at 01/17/22 1004   metoprolol succinate (TOPROL-XL) 24 hr tablet 100 mg  100 mg Oral QHS Ashok Pall, MD   100 mg at 01/16/22 2125   morphine (PF) 2 MG/ML injection 2 mg  2 mg Intravenous Q2H PRN Ashok Pall, MD   2 mg at 01/14/22 0533   ondansetron (ZOFRAN) tablet 4 mg  4 mg Oral Q6H PRN Ashok Pall, MD       Or   ondansetron (ZOFRAN) injection 4 mg  4 mg Intravenous Q6H PRN Ashok Pall, MD       oxyCODONE (Oxy IR/ROXICODONE) immediate release tablet 10 mg  10 mg Oral Q3H PRN Ashok Pall, MD   10 mg at 01/17/22 1004   oxyCODONE (Oxy IR/ROXICODONE) immediate release tablet 5 mg  5 mg Oral Q3H PRN Ashok Pall, MD   5 mg at 01/14/22 1702   oxyCODONE (OXYCONTIN) 12 hr tablet 10 mg  10 mg Oral Q12H Ashok Pall, MD   10 mg at 01/17/22 1004   pantoprazole (PROTONIX) EC tablet 40 mg  40 mg Oral Daily Ashok Pall, MD   40 mg at 01/17/22 1004   senna-docusate (Senokot-S) tablet 1 tablet  1 tablet Oral QHS PRN Ashok Pall, MD   1 tablet at 01/15/22 2131   sodium chloride flush (NS) 0.9 % injection 3 mL  3 mL Intravenous Q12H Ashok Pall, MD   3 mL at 01/16/22 2200   sodium chloride flush (NS) 0.9 % injection 3 mL  3 mL Intravenous PRN Ashok Pall, MD       zolpidem (AMBIEN) tablet 5 mg  5 mg Oral QHS PRN Ashok Pall, MD   5 mg at 01/13/22 2118     Discharge Medications: Please see discharge summary for a list of discharge medications.  Relevant Imaging Results:  Relevant Lab Results:   Additional  Information    Ludwig Clarks, LCSW

## 2022-01-17 NOTE — Progress Notes (Signed)
Subjective: Patient reports feeling better this morning. NAE ON.   Objective: Vital signs in last 24 hours: Temp:  [98.2 F (36.8 C)-98.8 F (37.1 C)] 98.8 F (37.1 C) (08/26 2046) Pulse Rate:  [96-107] 107 (08/26 2046) Resp:  [18] 18 (08/26 2046) BP: (123-129)/(59-65) 123/59 (08/26 2046) SpO2:  [97 %] 97 % (08/26 2046)  Intake/Output from previous day: 08/26 0701 - 08/27 0700 In: 303 [P.O.:300; I.V.:3] Out: -  Intake/Output this shift: No intake/output data recorded.  Physical Exam: Patient is awake, A/O X 4, conversant, and in good spirits. She is sitting up comfortably in bed. Eyes open spontaneously. NAD and VSS. Speech fluent and appropriate. MAEW. Sensation to light touch is intact. PERLA, EOMI. CNs grossly intact. Dressing is intact with minimal serosanguineous drainage. Incision is well approximated with no erythema or fluctuance   Lab Results: No results for input(s): "WBC", "HGB", "HCT", "PLT" in the last 72 hours. BMET No results for input(s): "NA", "K", "CL", "CO2", "GLUCOSE", "BUN", "CREATININE", "CALCIUM" in the last 72 hours.  Studies/Results: No results found.  Assessment/Plan: 66 y.o. female who is s/p L3/4 PLIF with revision of hardware at L4, S1 by Dr. Christella Noa on 01/14/2022. She is doing well and continuing to note improvement with her mobility. LLE radiculopathy improved. Appropriate surgical pain. Neuro exam stable. PT/OT recommending SNF.     LOS: 1 day     Marvis Moeller, DNP, AGNP-C Neurosurgery Nurse Practitioner  [facility] [place] [address], [address], [place], [place] [address] P: [phone]    F: [phone]  01/17/2022 10:25 AM

## 2022-01-17 NOTE — TOC Progression Note (Signed)
Transition of Care [facility]) - Progression Note    Patient Details  Name: [name] MRN: [id number] Date of Birth: March 01, 1956  [facility] CM/SW Contact  Ludwig Clarks, [place] Phone Number: 01/17/2022, 1:22 PM  Clinical Narrative:    Spoke with pt today in regards to PT/OT recommendations for SNF placement at dc. Pt prefers to go home at dc but understands given she will not have 24/7 assistance available she may require a ST-SNF stay. Pt agreeable to pursuing options in case of this.     Expected Discharge Plan: [place] Barriers to Discharge: Continued Medical Work up  Expected Discharge Plan and Services Expected Discharge Plan: [place]   Discharge Planning Services: CM Consult                      Eduard Clos MSW, LCSW Licensed Clinical Social Worker                    Social Determinants of Health ([place]) Interventions    Readmission Risk Interventions     No data to display

## 2022-01-18 DIAGNOSIS — M4316 Spondylolisthesis, lumbar region: Secondary | ICD-10-CM | POA: Diagnosis not present

## 2022-01-18 LAB — GLUCOSE, CAPILLARY: Glucose-Capillary: 133 mg/dL — ABNORMAL HIGH (ref 70–99)

## 2022-01-18 NOTE — Plan of Care (Signed)
A&Ox4, 1x assist w/ walker to bathroom. Small amount of drainage to surgical site dressing.    Problem: Education: Goal: Knowledge of General Education information will improve Description: Including pain rating scale, medication(s)/side effects and non-pharmacologic comfort measures Outcome: Progressing   Problem: Health Behavior/Discharge Planning: Goal: Ability to manage health-related needs will improve Outcome: Progressing   Problem: Clinical Measurements: Goal: Ability to maintain clinical measurements within normal limits will improve Outcome: Progressing Goal: Will remain free from infection Outcome: Progressing Goal: Diagnostic test results will improve Outcome: Progressing Goal: Respiratory complications will improve Outcome: Progressing Goal: Cardiovascular complication will be avoided Outcome: Progressing   Problem: Activity: Goal: Risk for activity intolerance will decrease Outcome: Progressing   Problem: Nutrition: Goal: Adequate nutrition will be maintained Outcome: Progressing   Problem: Coping: Goal: Level of anxiety will decrease Outcome: Progressing   Problem: Elimination: Goal: Will not experience complications related to bowel motility Outcome: Progressing Goal: Will not experience complications related to urinary retention Outcome: Progressing   Problem: Pain Managment: Goal: General experience of comfort will improve Outcome: Progressing   Problem: Safety: Goal: Ability to remain free from injury will improve Outcome: Progressing   Problem: Skin Integrity: Goal: Risk for impaired skin integrity will decrease Outcome: Progressing

## 2022-01-18 NOTE — Progress Notes (Signed)
Patient ID: [name], female   DOB: [birth year], 66 y.o.   MRN: [id number] BP 123/65 (BP Location: Right Arm)   Pulse 91   Temp 98.4 F (36.9 C) (Oral)   Resp 18   Ht '4\' 10"'$  (1.473 m)   Wt 90.7 kg   SpO2 98%   BMI 41.80 kg/m  Alert, moving well Discharge tomorrow

## 2022-01-18 NOTE — Progress Notes (Signed)
Mobility Specialist Progress Note   01/18/22 1603  Mobility  Activity Ambulated with assistance in hallway  Level of Assistance Contact guard assist, steadying assist  Distance Ambulated (ft) 256 ft  Activity Response Tolerated well  $Mobility charge 1 Mobility   Received pt in bed having no complaints and agreeable to mobility. Pt required no physical assistance throughout but needed min cues for posture in RW. X1 seated rest break d/t fatigue but no R knee buckling observed.  Returned back to bed w/ call bell in reach and bed alarm on.   Holland Falling Mobility Specialist [place] #:  [phone] Acute Rehab Office:  [phone]

## 2022-01-18 NOTE — Progress Notes (Signed)
Physical Therapy Treatment Patient Details Name: [name] MRN: [id number] DOB: [birth year] Today's Date: 01/18/2022   History of Present Illness Pt is a 66 y/o female s/p L3-L4 PLIF and posterior arthrodesis L3-S1. Pt has PMH of moderate aortic stenosis, anxiety, asthma, obesity, diabetes mellitus type 2, GERD, hypertension, and hyperlipidemia.    PT Comments    Pt admitted with above diagnosis. Pt was able to incr ambulation distance today, no knee buckling and did not need physical assist. States that husband and others can help at home and wants to go home.  HAs equipment.   Pt currently with functional limitations due to balance and endurance deficits. Pt will benefit from skilled PT to increase their independence and safety with mobility to allow discharge to the venue listed below.      Recommendations for follow up therapy are one component of a multi-disciplinary discharge planning process, led by the attending physician.  Recommendations may be updated based on patient status, additional functional criteria and insurance authorization.  Follow Up Recommendations  Home health PT     Assistance Recommended at Discharge Frequent or constant Supervision/Assistance  Patient can return home with the following A little help with bathing/dressing/bathroom;Assistance with cooking/housework;Assist for transportation;Help with stairs or ramp for entrance;A lot of help with walking and/or transfers   Equipment Recommendations  None recommended by PT    Recommendations for Other Services       Precautions / Restrictions Precautions Precautions: Back;Fall Precaution Booklet Issued:  (previously given) Precaution Comments: hx RLE buckling (not today) Restrictions Weight Bearing Restrictions: No     Mobility  Bed Mobility Overal bed mobility: Needs Assistance Bed Mobility: Rolling, Sidelying to Sit, Sit to Sidelying Rolling: Supervision Sidelying to sit: Supervision Supine  to sit: Supervision     General bed mobility comments: Cues for sequencing and technique maintaining spine precautions    Transfers Overall transfer level: Needs assistance Equipment used: Rolling walker (2 wheels) Transfers: Sit to/from Stand, Bed to chair/wheelchair/BSC Sit to Stand: Min guard           General transfer comment: min guard  a sit/stand from EOB<>RW, cues for  hand placement    Ambulation/Gait Ambulation/Gait assistance: Min guard Gait Distance (Feet): 175 Feet Assistive device: Rolling walker (2 wheels) Gait Pattern/deviations: Step-to pattern, Antalgic, Trunk flexed, Decreased step length - left, Decreased step length - right Gait velocity: decreased Gait velocity interpretation: <1.31 ft/sec, indicative of household ambulator   General Gait Details: cues for upright posture and sequencing, good carryover; no buckling today   Stairs             Wheelchair Mobility    Modified Rankin (Stroke Patients Only)       Balance Overall balance assessment: Needs assistance Sitting-balance support: No upper extremity supported Sitting balance-[name]: Good     Standing balance support: Reliant on assistive device for balance, Bilateral upper extremity supported Standing balance-[name]: Poor Standing balance comment: reliant on RW                            Cognition Arousal/Alertness: Awake/alert Behavior During Therapy: WFL for tasks assessed/performed Overall Cognitive Status: Within Functional Limits for tasks assessed                                 General Comments: A and O x 4, at times slow response to questions  Exercises Other Exercises Other Exercises: seated BLE AROM: heel/toe raises, LAQ x10 reps ea    General Comments General comments (skin integrity, edema, etc.): VSS      Pertinent Vitals/Pain Pain Assessment Pain Assessment: No/denies pain    Home Living                           Prior Function            PT Goals (current goals can now be found in the care plan section) Acute Rehab PT Goals Patient Stated Goal: "to be able to take care of myself and be independent" Progress towards PT goals: Progressing toward goals    Frequency    Min 5X/week      PT Plan Discharge plan needs to be updated    Co-evaluation              AM-PAC PT "6 Clicks" Mobility   Outcome Measure  Help needed turning from your back to your side while in a flat bed without using bedrails?: A Little Help needed moving from lying on your back to sitting on the side of a flat bed without using bedrails?: A Little Help needed moving to and from a bed to a chair (including a wheelchair)?: A Little Help needed standing up from a chair using your arms (e.g., wheelchair or bedside chair)?: A Little Help needed to walk in hospital room?: A Little Help needed climbing 3-5 steps with a railing? : A Lot 6 Click Score: 17    End of Session Equipment Utilized During Treatment: Gait belt Activity Tolerance: Patient tolerated treatment well;Patient limited by pain Patient left: with call bell/phone within reach;in chair;with chair alarm set Nurse Communication: Mobility status;Precautions PT Visit Diagnosis: Other abnormalities of gait and mobility (R26.89);Muscle weakness (generalized) (M62.81);Pain Pain - part of body:  (back)     Time: 0951-1010 PT Time Calculation (min) (ACUTE ONLY): 19 min  Charges:  $Gait Training: 8-22 mins                     Lorynn Moeser M,PT Acute Rehab Services [phone]    Alvira Philips 01/18/2022, 2:03 PM

## 2022-01-18 NOTE — TOC Progression Note (Signed)
Transition of Care [facility]) - Progression Note    Patient Details  Name: [name] MRN: [id number] Date of Birth: 06-Jun-1955  Transition of [facility]) CM/SW Contact  Bartholomew Crews, RN Phone Number: [phone] 01/18/2022, 1:46 PM  Clinical Narrative:     Spoke with patient at the bedside to discuss post acute transition. PTA home with husband. Has a RW, rollator, and 3n1 at home. Does not feel that she needs a wheelchair at this time. Discussed home health vs SNF - reviewed bed offers. Patient would like to return home. She has others who can assist at home during her recovery. Agreeable to [facility] - offered choice. Referral accepted by [facility] for PT, OT, Aide. Patient will need HH PT, OT, Aide and Face to Face orders. Patient advised that she can arrange her transportation. TOC following for transition needs.   Expected Discharge Plan: [place] Barriers to Discharge: Continued Medical Work up  Expected Discharge Plan and Services Expected Discharge Plan: [place] In-house Referral: Clinical Social Work Discharge Planning Services: CM Consult Post Acute Care Choice: [place] arrangements for the past 2 months: Single Family Home                 DME Arranged: N/A DME Agency: NA       HH Arranged: PT, OT, Nurse's Aide [place] Agency: [place] Date [place]: 01/18/22 Time [place]: [id number] Representative spoke with at [place]: Tommi Rumps   Social Determinants of Health ([place]) Interventions    Readmission Risk Interventions     No data to display

## 2022-01-19 DIAGNOSIS — M4316 Spondylolisthesis, lumbar region: Secondary | ICD-10-CM | POA: Diagnosis not present

## 2022-01-19 LAB — GLUCOSE, CAPILLARY: Glucose-Capillary: 125 mg/dL — ABNORMAL HIGH (ref 70–99)

## 2022-01-19 MED ORDER — OXYCODONE HCL 5 MG PO TABS: 5.0000 mg | ORAL_TABLET | Freq: Four times a day (QID) | ORAL | 0 refills | Status: DC | PRN

## 2022-01-19 NOTE — Discharge Summary (Signed)
Physician Discharge Summary  Patient ID: [name] MRN: [id number] DOB/AGE: [birth year] 64 y.o.  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 Post op she has worked with PT, and OT. They felt a snf was appropriate, family declined and will take [name]  Treatments: surgery: LUMBAR THREE-FOUR  POSTERIOR LUMBAR INTERBODY FUSION, [employer] [place] cages filled with autograft morsels, 22 x 927mPOSTERIOR ARTHRODESIS L3-S1,  Laminectomy L3 in excess of needed exposure for the PLIF Removal L4, and S1 pedicle screws.  Placement 7.23m21mcrews(nuvasive relign screws) at L3, and L4 Segmental pedicle screw fixation L3-S1   Discharge Exam: Blood pressure (!) 127/104, pulse 100, temperature 98.4 F (36.9 C), temperature source Oral, resp. rate 18, height '4\' 10"'$  (1.473 m), weight 90.7 kg, SpO2 99 %. General appearance: alert, cooperative, appears stated age, moderate distress, and wound is clean, no signs of infection  Disposition: Discharge disposition: 01-Home or Self Care      Spondylolisthesis, Lumbar region Disc Displacement, Lumbar Discharge Instructions     Incentive spirometry RT   Complete by: As directed       Allergies as of 01/19/2022       Reactions   Methocarbamol Other (See Comments)   Insomnia; Kept pt awake for 2 days   Rosuvastatin Other (See Comments)   myalgia   Simvastatin    Leg Cramps   Atorvastatin Other (See Comments)   Myalgia   Sulfa Antibiotics Hives        Medication List      STOP taking these medications    fenofibrate 54 MG tablet   lansoprazole 30 MG capsule Commonly known as: PREVACID   tiZANidine 4 MG tablet Commonly known as: ZANAFLEX       TAKE these medications    albuterol 108 (90 Base) MCG/ACT inhaler Commonly known as: ProAir HFA Inhale 2 puffs into the lungs every 6 (six) hours as needed.   albuterol 108 (90 Base) MCG/ACT inhaler Commonly known as: VENTOLIN HFA INHALE TWO PUFFS INTO THE LUNGS EVERY 6 (SIX) HOURS AS NEEDED FOR WHEEZING.   amLODipine 10 MG tablet Commonly known as: NORVASC Take 1 tablet (10 mg total) by mouth daily.   celecoxib 200 MG capsule Commonly known as: CELEBREX Take 200 mg by mouth 2 (two) times daily.   cetirizine 10 MG tablet Commonly known as: ZYRTEC Take 1 tablet (10 mg total) by mouth daily.   cyanocobalamin 1000 MCG tablet Commonly known as: VITAMIN B12 Take 1,000 mcg by mouth daily.   cyclobenzaprine 5 MG tablet Commonly known as: FLEXERIL Take 5 mg by mouth at bedtime.   diclofenac 75 MG EC tablet Commonly known as: VOLTAREN Take 75 mg by mouth 2 (two) times daily as needed (gout pain).   docusate sodium 100 MG capsule Commonly known as: COLACE Take 100 mg by mouth 2 (two) times daily.   Ferrex 150 150 MG capsule Generic drug: iron polysaccharides TAKE 1 CAPSULE (150 MG DOSE) BY MOUTH TWICE A DAY What changed:  how much to take how to take this when to take this   gabapentin 600 MG tablet Commonly known as: NEURONTIN Take  600 mg by mouth 3 (three) times daily.   losartan-hydrochlorothiazide 100-25 MG tablet Commonly known as: HYZAAR Take 1 tablet by mouth daily.   metFORMIN 1000 MG tablet Commonly known as: GLUCOPHAGE Take 1 tablet (1,000 mg total) by mouth 2 (two) times daily with a meal.   metoprolol succinate 100 MG 24 hr tablet Commonly known as: TOPROL-XL Take 1 tablet (100 mg total) by mouth daily. What changed: when to take this   oxyCODONE 5 MG immediate  release tablet Commonly known as: Oxy IR/ROXICODONE Take 1 tablet (5 mg total) by mouth every 6 (six) hours as needed for moderate pain ((score 4 to 6)).   pantoprazole 40 MG tablet Commonly known as: PROTONIX Take 40 mg by mouth daily.        Follow-up Information     [facility], [facility] Follow up.   Specialty: [place] Why: someone from the office at [facility] will call to schedule home health visits Contact information: [address] [place] [address] [phone]         Ashok Pall, MD Follow up.   Specialty: Neurosurgery Why: keep your scheduled appointment Contact information: [address] [place] [address] [place] [address] [phone]                 Signed: Ashok Pall 01/19/2022, 6:31 PM

## 2022-01-19 NOTE — Progress Notes (Signed)
Occupational Therapy Treatment Patient Details Name: [name] MRN: [id number] DOB: Jan 02, 1956 Today's Date: 01/19/2022   History of present illness 66 y/o female s/p L3-L4 PLIF and posterior arthrodesis L3-S1. Pt has PMH of moderate aortic stenosis, anxiety, asthma, obesity, diabetes mellitus type 2, GERD, hypertension, and hyperlipidemia.   OT comments  Pt without any R LE buckle this session but reports feeling its still "weak". Pt progressed to sink level task this session. Pt has DME for LB dressing and reviewed. Pt needs continued cues for back precautions with adls. Recommendation updated to [facility] due to pt declined SNF.    Recommendations for follow up therapy are one component of a multi-disciplinary discharge planning process, led by the attending physician.  Recommendations may be updated based on patient status, additional functional criteria and insurance authorization.    Follow Up Recommendations  Home health OT (declined SNF so recommendation updated)    Assistance Recommended at Discharge Intermittent Supervision/Assistance  Patient can return home with the following  A little help with walking and/or transfers;A little help with bathing/dressing/bathroom;Direct supervision/assist for medications management;Assist for transportation   Equipment Recommendations  BSC/3in1    Recommendations for Other Services      Precautions / Restrictions Precautions Precautions: Back;Fall Precaution Comments: hx of R LE buckle       Mobility Bed Mobility Overal bed mobility: Needs Assistance Bed Mobility: Rolling, Supine to Sit Rolling: Min assist Sidelying to sit: Min guard Supine to sit: Min guard     General bed mobility comments: side rail removed and cued to push up on L elbow to static sitting. cues not to pull on bedside table on wheels    Transfers Overall transfer level: Needs assistance Equipment used: Rolling walker (2 wheels) Transfers: Sit to/from  Stand, Bed to chair/wheelchair/BSC Sit to Stand: Min guard           General transfer comment: cues to not pull up on RW.     Balance           Standing balance support: Single extremity supported, During functional activity, Reliant on assistive device for balance Standing balance-[name]: Poor                             ADL either performed or assessed with clinical judgement   ADL Overall ADL's : Needs assistance/impaired Eating/Feeding: Modified independent   Grooming: Wash/dry face;Oral care;Supervision/safety;Standing Grooming Details (indicate cue type and reason): requires cues for upright posture. pt leaning heavily on sink surface on elbows               Lower Body Dressing Details (indicate cue type and reason): pt has reacher at home for LB dressing Toilet Transfer: Supervision/safety;Rolling walker (2 wheels)           Functional mobility during ADLs: Supervision/safety;Rolling walker (2 wheels) General ADL Comments: cues throughout session for posture    Extremity/Trunk Assessment Upper Extremity Assessment Upper Extremity Assessment: Overall WFL for tasks assessed   Lower Extremity Assessment Lower Extremity Assessment: Defer to PT evaluation RLE Deficits / Details: no buckle this session        Vision   Vision Assessment?: No apparent visual deficits   Perception     Praxis      Cognition Arousal/Alertness: Awake/alert Behavior During Therapy: WFL for tasks assessed/performed Overall Cognitive Status: Impaired/Different from baseline Area of Impairment: Safety/judgement, Awareness  Safety/Judgement: Decreased awareness of deficits Awareness: Emergent   General Comments: requires cues for back precautions.        Exercises      Shoulder Instructions       General Comments      Pertinent Vitals/ Pain       Pain Assessment Pain Assessment: Faces Faces Pain Scale: Hurts  a little bit Pain Location: back Pain Descriptors / Indicators: Discomfort, Sore Pain Intervention(s): Monitored during session, Premedicated before session, Repositioned  Home Living                                          Prior Functioning/Environment              Frequency  Min 2X/week        Progress Toward Goals  OT Goals(current goals can now be found in the care plan section)  Progress towards OT goals: Progressing toward goals  Acute Rehab OT Goals Patient Stated Goal: to go home today OT Goal Formulation: With patient Potential to Achieve Goals: Good ADL Goals Pt Will Perform Grooming: sitting;with modified independence Pt Will Perform Lower Body Bathing: with min assist;sit to/from stand Pt Will Perform Lower Body Dressing: with min assist;with adaptive equipment;sit to/from stand Pt Will Transfer to Toilet: bedside commode;stand pivot transfer;with supervision  Plan Discharge plan needs to be updated    Co-evaluation                 AM-PAC OT "6 Clicks" Daily Activity     Outcome Measure   Help from another person eating meals?: None Help from another person taking care of personal grooming?: A Little Help from another person toileting, which includes using toliet, bedpan, or urinal?: A Lot Help from another person bathing (including washing, rinsing, drying)?: A Lot Help from another person to put on and taking off regular upper body clothing?: A Little Help from another person to put on and taking off regular lower body clothing?: A Lot 6 Click Score: 16    End of Session Equipment Utilized During Treatment: Gait belt;Rolling walker (2 wheels)  OT Visit Diagnosis: Unsteadiness on feet (R26.81)   Activity Tolerance Patient tolerated treatment well   Patient Left in chair;with call bell/phone within reach;with chair alarm set   Nurse Communication Mobility status;Precautions        Time: [phone] OT Time  Calculation (min): 16 min  Charges: OT General Charges $OT Visit: 1 Visit OT Treatments $Self Care/Home Management : 8-22 mins   Brynn, OTR/L  Acute Rehabilitation Services Office: [phone] .   Jeri Modena 01/19/2022, 9:27 AM

## 2022-01-19 NOTE — Progress Notes (Signed)
Mobility Specialist Progress Note   01/19/22 1230  Mobility  Activity Ambulated with assistance in hallway  Level of Assistance Contact guard assist, steadying assist  Assistive Device Front wheel walker  Distance Ambulated (ft) 180 ft  Activity Response Tolerated well  $Mobility charge 1 Mobility   Received pt in bed having no complaints and agreeable to mobility. Pt required no physical assistance throughout but needed min cues for posture in RW. X1 standing rest break d/t fatigue but no R knee buckling observed.  Returned back to bed w/ call bell in reach and bed alarm on.   Holland Falling Mobility Specialist [place] #:  [phone] Acute Rehab Office:  [phone]

## 2022-01-19 NOTE — Discharge Instructions (Signed)

## 2022-01-19 NOTE — Progress Notes (Signed)
Physical Therapy Treatment Patient Details Name: [name] MRN: [id number] DOB: 12-22-1955 Today's Date: 01/19/2022   History of Present Illness 66 y/o female s/p L3-L4 PLIF and posterior arthrodesis L3-S1. Pt has PMH of moderate aortic stenosis, anxiety, asthma, obesity, diabetes mellitus type 2, GERD, hypertension, and hyperlipidemia.    PT Comments    Pt was seen for a walk but then declined to try steps due to pain.  Her plan is to get another short session today for managing steps, and agrees to walk with mobility specialists as well for recovery of her independence for home.  Follow up later for completion of sessions.   Recommendations for follow up therapy are one component of a multi-disciplinary discharge planning process, led by the attending physician.  Recommendations may be updated based on patient status, additional functional criteria and insurance authorization.  Follow Up Recommendations  Home health PT Can patient physically be transported by private vehicle: No   Assistance Recommended at Discharge Frequent or constant Supervision/Assistance  Patient can return home with the following A little help with bathing/dressing/bathroom;Assistance with cooking/housework;Assist for transportation;Help with stairs or ramp for entrance;A lot of help with walking and/or transfers   Equipment Recommendations  None recommended by PT    Recommendations for Other Services       Precautions / Restrictions Precautions Precautions: Back;Fall Precaution Comments: R knee is weak Restrictions Weight Bearing Restrictions: No     Mobility  Bed Mobility Overal bed mobility: Needs Assistance             General bed mobility comments: in chair when PT arrived    Transfers Overall transfer level: Needs assistance Equipment used: Rolling walker (2 wheels) Transfers: Sit to/from Stand Sit to Stand: Min assist           General transfer comment: discussed her  efforts to stand by pulling on walker    Ambulation/Gait Ambulation/Gait assistance: Min guard Gait Distance (Feet): 45 Feet Assistive device: Rolling walker (2 wheels) Gait Pattern/deviations: Step-to pattern, Step-through pattern, Antalgic, Wide base of support Gait velocity: reduced Gait velocity interpretation: <1.31 ft/sec, indicative of household ambulator   General Gait Details: reminders for safe use of walker   Stairs             Wheelchair Mobility    Modified Rankin (Stroke Patients Only)       Balance Overall balance assessment: Needs assistance Sitting-balance support: Feet supported Sitting balance-[name]: Good     Standing balance support: Bilateral upper extremity supported, During functional activity Standing balance-[name]: Fair Standing balance comment: less than fair dynamically                            Cognition Arousal/Alertness: Awake/alert Behavior During Therapy: WFL for tasks assessed/performed Overall Cognitive Status: No family/caregiver present to determine baseline cognitive functioning Area of Impairment: Awareness, Problem solving                         Safety/Judgement: Decreased awareness of safety, Decreased awareness of deficits Awareness: Intellectual Problem Solving: Requires verbal cues, Requires tactile cues          Exercises      General Comments General comments (skin integrity, edema, etc.): pt was seen for attempt to get on stairs but in too much pain, asking PT to return to do stairclimbing      Pertinent Vitals/Pain Pain Assessment Pain Assessment: 0-10 Pain  Score: 7  Pain Location: back Pain Descriptors / Indicators: Grimacing, Guarding Pain Intervention(s): Monitored during session, Repositioned, Limited activity within patient's tolerance    Home Living                          Prior Function            PT Goals (current goals can now be found in  the care plan section) Acute Rehab PT Goals Patient Stated Goal: get better and get home    Frequency    Min 5X/week      PT Plan Current plan remains appropriate    Co-evaluation              AM-PAC PT "6 Clicks" Mobility   Outcome Measure  Help needed turning from your back to your side while in a flat bed without using bedrails?: A Little Help needed moving from lying on your back to sitting on the side of a flat bed without using bedrails?: A Little Help needed moving to and from a bed to a chair (including a wheelchair)?: A Little Help needed standing up from a chair using your arms (e.g., wheelchair or bedside chair)?: A Little Help needed to walk in hospital room?: A Little Help needed climbing 3-5 steps with a railing? : A Lot 6 Click Score: 17    End of Session Equipment Utilized During Treatment: Gait belt Activity Tolerance: Patient tolerated treatment well;Patient limited by pain Patient left: with call bell/phone within reach;in chair;with chair alarm set Nurse Communication: Mobility status;Precautions PT Visit Diagnosis: Unsteadiness on feet (R26.81);Other abnormalities of gait and mobility (R26.89);Muscle weakness (generalized) (M62.81)     Time: 8828-0034 PT Time Calculation (min) (ACUTE ONLY): 17 min  Charges:  $Gait Training: 8-22 mins    Ramond Dial 01/19/2022, 1:28 PM  Mee Hives, PT PhD Acute Rehab Dept. Number: [place]

## 2022-01-19 NOTE — Progress Notes (Signed)
Physical Therapy Treatment Patient Details Name: [name] MRN: [id number] DOB: 1956/03/03 Today's Date: 01/19/2022   History of Present Illness 66 y/o female s/p L3-L4 PLIF and posterior arthrodesis L3-S1. Pt has PMH of moderate aortic stenosis, anxiety, asthma, obesity, diabetes mellitus type 2, GERD, hypertension, and hyperlipidemia.    PT Comments    Pt was unable to agree to stairs upon return but was agreeing to walk with mobility tech upon his return.  Reviewed her safe bed exercises, and discussed a warm up routine to use to get OOB to walk.  Pt is verbalizing and demonstrating understanding of the instructions, and will reinforce these with her next session.  Has done stair practice for home once, but would benefit from another time if possible before dc.   Recommendations for follow up therapy are one component of a multi-disciplinary discharge planning process, led by the attending physician.  Recommendations may be updated based on patient status, additional functional criteria and insurance authorization.  Follow Up Recommendations  Home health PT Can patient physically be transported by private vehicle: No   Assistance Recommended at Discharge Frequent or constant Supervision/Assistance  Patient can return home with the following A little help with bathing/dressing/bathroom;Assistance with cooking/housework;Assist for transportation;Help with stairs or ramp for entrance;A lot of help with walking and/or transfers   Equipment Recommendations  None recommended by PT    Recommendations for Other Services       Precautions / Restrictions Precautions Precautions: Back;Fall Precaution Comments: reviewed back precautions Restrictions Weight Bearing Restrictions: No     Mobility  Bed Mobility Overal bed mobility: Needs Assistance             General bed mobility comments: asked to remain in bed    Transfers Overall transfer level: Needs  assistance Equipment used: Rolling walker (2 wheels) Transfers: Sit to/from Stand Sit to Stand: Min assist           General transfer comment: discussed her efforts to stand by pulling on walker    Ambulation/Gait Ambulation/Gait assistance: Min guard Gait Distance (Feet): 45 Feet Assistive device: Rolling walker (2 wheels) Gait Pattern/deviations: Step-to pattern, Step-through pattern, Antalgic, Wide base of support Gait velocity: reduced Gait velocity interpretation: <1.31 ft/sec, indicative of household ambulator   General Gait Details: reminders for safe use of walker   Stairs             Wheelchair Mobility    Modified Rankin (Stroke Patients Only)       Balance Overall balance assessment: Needs assistance Sitting-balance support: Feet supported Sitting balance-[name]: Good     Standing balance support: Bilateral upper extremity supported, During functional activity Standing balance-[name]: Fair Standing balance comment: less than fair dynamically                            Cognition Arousal/Alertness: Awake/alert Behavior During Therapy: WFL for tasks assessed/performed Overall Cognitive Status: No family/caregiver present to determine baseline cognitive functioning Area of Impairment: Memory, Attention                   Current Attention Level: Selective Memory: Decreased recall of precautions, Decreased short-term memory   Safety/Judgement: Decreased awareness of deficits Awareness: Intellectual Problem Solving: Requires verbal cues, Requires tactile cues          Exercises General Exercises - Lower Extremity Ankle Circles/Pumps: AROM, 5 reps Quad Sets: AROM, 10 reps Gluteal Sets: AROM, 10 reps Heel Slides:  AROM, 10 reps Hip ABduction/ADduction: AROM, 10 reps    General Comments General comments (skin integrity, edema, etc.): pt was seen for attempt to get on stairs but in too much pain, asking PT to return  to do stairclimbing      Pertinent Vitals/Pain Pain Assessment Pain Assessment: 0-10 Pain Score: 5  Pain Location: back Pain Descriptors / Indicators: Guarding, Tender Pain Intervention(s): Limited activity within patient's tolerance, Monitored during session, Repositioned    Home Living                          Prior Function            PT Goals (current goals can now be found in the care plan section) Acute Rehab PT Goals Patient Stated Goal: get stronger for home    Frequency    Min 5X/week      PT Plan Current plan remains appropriate    Co-evaluation              AM-PAC PT "6 Clicks" Mobility   Outcome Measure  Help needed turning from your back to your side while in a flat bed without using bedrails?: A Little Help needed moving from lying on your back to sitting on the side of a flat bed without using bedrails?: A Little Help needed moving to and from a bed to a chair (including a wheelchair)?: A Little Help needed standing up from a chair using your arms (e.g., wheelchair or bedside chair)?: A Little Help needed to walk in hospital room?: A Little Help needed climbing 3-5 steps with a railing? : A Lot 6 Click Score: 17    End of Session Equipment Utilized During Treatment: Gait belt Activity Tolerance: Patient limited by fatigue;Treatment limited secondary to medical complications (Comment) Patient left: in bed;with call bell/phone within reach;with bed alarm set Nurse Communication: Mobility status PT Visit Diagnosis: Unsteadiness on feet (R26.81);Other abnormalities of gait and mobility (R26.89);Muscle weakness (generalized) (M62.81)     Time: 2500-3704 PT Time Calculation (min) (ACUTE ONLY): 20 min  Charges:  $Gait Training: 8-22 mins $Therapeutic Exercise: 8-22 mins       Ramond Dial 01/19/2022, 1:35 PM  Mee Hives, PT PhD Acute Rehab Dept. Number: [place] and [place]

## 2022-01-21 ENCOUNTER — Other Ambulatory Visit (HOSPITAL_COMMUNITY): Payer: Medicare HMO

## 2022-02-02 ENCOUNTER — Other Ambulatory Visit: Payer: Self-pay | Admitting: Neurosurgery

## 2022-02-02 DIAGNOSIS — M4316 Spondylolisthesis, lumbar region: Secondary | ICD-10-CM

## 2022-02-03 ENCOUNTER — Emergency Department (HOSPITAL_COMMUNITY): Payer: Medicare HMO

## 2022-02-03 ENCOUNTER — Encounter (HOSPITAL_COMMUNITY): Payer: Self-pay | Admitting: Emergency Medicine

## 2022-02-03 ENCOUNTER — Inpatient Hospital Stay (HOSPITAL_COMMUNITY)
Admission: EM | Admit: 2022-02-03 | Discharge: 2022-02-12 | DRG: 853 | Disposition: A | Discharge status: Skilled Nursing Facility | Payer: Medicare HMO | Attending: Neurosurgery | Admitting: Neurosurgery

## 2022-02-03 ENCOUNTER — Other Ambulatory Visit: Payer: Self-pay

## 2022-02-03 ENCOUNTER — Inpatient Hospital Stay (HOSPITAL_COMMUNITY): Payer: Medicare HMO

## 2022-02-03 DIAGNOSIS — A4159 Other Gram-negative sepsis: Secondary | ICD-10-CM | POA: Diagnosis present

## 2022-02-03 DIAGNOSIS — S32039A Unspecified fracture of third lumbar vertebra, initial encounter for closed fracture: Secondary | ICD-10-CM | POA: Diagnosis present

## 2022-02-03 DIAGNOSIS — J449 Chronic obstructive pulmonary disease, unspecified: Secondary | ICD-10-CM | POA: Diagnosis present

## 2022-02-03 DIAGNOSIS — E785 Hyperlipidemia, unspecified: Secondary | ICD-10-CM | POA: Diagnosis present

## 2022-02-03 DIAGNOSIS — M488X6 Other specified spondylopathies, lumbar region: Secondary | ICD-10-CM | POA: Diagnosis present

## 2022-02-03 DIAGNOSIS — D508 Other iron deficiency anemias: Secondary | ICD-10-CM | POA: Diagnosis present

## 2022-02-03 DIAGNOSIS — I10 Essential (primary) hypertension: Secondary | ICD-10-CM | POA: Diagnosis present

## 2022-02-03 DIAGNOSIS — Z823 Family history of stroke: Secondary | ICD-10-CM

## 2022-02-03 DIAGNOSIS — Z6841 Body Mass Index (BMI) 40.0 and over, adult: Secondary | ICD-10-CM

## 2022-02-03 DIAGNOSIS — Z888 Allergy status to other drugs, medicaments and biological substances status: Secondary | ICD-10-CM

## 2022-02-03 DIAGNOSIS — M1612 Unilateral primary osteoarthritis, left hip: Secondary | ICD-10-CM | POA: Diagnosis present

## 2022-02-03 DIAGNOSIS — K6812 Psoas muscle abscess: Secondary | ICD-10-CM | POA: Diagnosis present

## 2022-02-03 DIAGNOSIS — E119 Type 2 diabetes mellitus without complications: Secondary | ICD-10-CM | POA: Diagnosis present

## 2022-02-03 DIAGNOSIS — M545 Low back pain, unspecified: Secondary | ICD-10-CM

## 2022-02-03 DIAGNOSIS — M48061 Spinal stenosis, lumbar region without neurogenic claudication: Secondary | ICD-10-CM | POA: Diagnosis present

## 2022-02-03 DIAGNOSIS — R410 Disorientation, unspecified: Secondary | ICD-10-CM | POA: Diagnosis present

## 2022-02-03 DIAGNOSIS — R609 Edema, unspecified: Secondary | ICD-10-CM | POA: Diagnosis present

## 2022-02-03 DIAGNOSIS — M5136 Other intervertebral disc degeneration, lumbar region: Secondary | ICD-10-CM

## 2022-02-03 DIAGNOSIS — Z792 Long term (current) use of antibiotics: Secondary | ICD-10-CM

## 2022-02-03 DIAGNOSIS — Z886 Allergy status to analgesic agent status: Secondary | ICD-10-CM

## 2022-02-03 DIAGNOSIS — Z20822 Contact with and (suspected) exposure to covid-19: Secondary | ICD-10-CM | POA: Diagnosis present

## 2022-02-03 DIAGNOSIS — M9669 Fracture of other bone following insertion of orthopedic implant, joint prosthesis, or bone plate: Secondary | ICD-10-CM | POA: Diagnosis present

## 2022-02-03 DIAGNOSIS — T8484XA Pain due to internal orthopedic prosthetic devices, implants and grafts, initial encounter: Secondary | ICD-10-CM | POA: Diagnosis present

## 2022-02-03 DIAGNOSIS — D638 Anemia in other chronic diseases classified elsewhere: Secondary | ICD-10-CM | POA: Diagnosis present

## 2022-02-03 DIAGNOSIS — B964 Proteus (mirabilis) (morganii) as the cause of diseases classified elsewhere: Secondary | ICD-10-CM | POA: Diagnosis present

## 2022-02-03 DIAGNOSIS — R7881 Bacteremia: Secondary | ICD-10-CM

## 2022-02-03 DIAGNOSIS — T84216A Breakdown (mechanical) of internal fixation device of vertebrae, initial encounter: Secondary | ICD-10-CM | POA: Diagnosis present

## 2022-02-03 DIAGNOSIS — M4636 Infection of intervertebral disc (pyogenic), lumbar region: Secondary | ICD-10-CM | POA: Diagnosis present

## 2022-02-03 DIAGNOSIS — Y792 Prosthetic and other implants, materials and accessory orthopedic devices associated with adverse incidents: Secondary | ICD-10-CM | POA: Diagnosis present

## 2022-02-03 DIAGNOSIS — Z981 Arthrodesis status: Secondary | ICD-10-CM

## 2022-02-03 DIAGNOSIS — I350 Nonrheumatic aortic (valve) stenosis: Secondary | ICD-10-CM | POA: Diagnosis present

## 2022-02-03 DIAGNOSIS — M62838 Other muscle spasm: Secondary | ICD-10-CM | POA: Diagnosis present

## 2022-02-03 DIAGNOSIS — R0682 Tachypnea, not elsewhere classified: Secondary | ICD-10-CM | POA: Diagnosis present

## 2022-02-03 DIAGNOSIS — Z8249 Family history of ischemic heart disease and other diseases of the circulatory system: Secondary | ICD-10-CM

## 2022-02-03 DIAGNOSIS — Y831 Surgical operation with implant of artificial internal device as the cause of abnormal reaction of the patient, or of later complication, without mention of misadventure at the time of the procedure: Secondary | ICD-10-CM | POA: Diagnosis present

## 2022-02-03 DIAGNOSIS — R6520 Severe sepsis without septic shock: Secondary | ICD-10-CM | POA: Diagnosis not present

## 2022-02-03 DIAGNOSIS — M4626 Osteomyelitis of vertebra, lumbar region: Secondary | ICD-10-CM | POA: Diagnosis present

## 2022-02-03 DIAGNOSIS — E7800 Pure hypercholesterolemia, unspecified: Secondary | ICD-10-CM | POA: Diagnosis present

## 2022-02-03 DIAGNOSIS — G9340 Encephalopathy, unspecified: Secondary | ICD-10-CM | POA: Diagnosis present

## 2022-02-03 DIAGNOSIS — M4316 Spondylolisthesis, lumbar region: Secondary | ICD-10-CM

## 2022-02-03 DIAGNOSIS — A419 Sepsis, unspecified organism: Secondary | ICD-10-CM | POA: Diagnosis present

## 2022-02-03 DIAGNOSIS — Z882 Allergy status to sulfonamides status: Secondary | ICD-10-CM

## 2022-02-03 DIAGNOSIS — F419 Anxiety disorder, unspecified: Secondary | ICD-10-CM | POA: Diagnosis present

## 2022-02-03 DIAGNOSIS — D649 Anemia, unspecified: Secondary | ICD-10-CM | POA: Diagnosis not present

## 2022-02-03 DIAGNOSIS — R519 Headache, unspecified: Secondary | ICD-10-CM | POA: Diagnosis present

## 2022-02-03 DIAGNOSIS — D500 Iron deficiency anemia secondary to blood loss (chronic): Secondary | ICD-10-CM | POA: Diagnosis present

## 2022-02-03 DIAGNOSIS — R000 Tachycardia, unspecified: Secondary | ICD-10-CM | POA: Diagnosis present

## 2022-02-03 DIAGNOSIS — E669 Obesity, unspecified: Secondary | ICD-10-CM | POA: Diagnosis present

## 2022-02-03 DIAGNOSIS — L02214 Cutaneous abscess of groin: Secondary | ICD-10-CM | POA: Diagnosis present

## 2022-02-03 DIAGNOSIS — Z7984 Long term (current) use of oral hypoglycemic drugs: Secondary | ICD-10-CM

## 2022-02-03 DIAGNOSIS — Z791 Long term (current) use of non-steroidal anti-inflammatories (NSAID): Secondary | ICD-10-CM

## 2022-02-03 DIAGNOSIS — T85628A Displacement of other specified internal prosthetic devices, implants and grafts, initial encounter: Secondary | ICD-10-CM | POA: Diagnosis present

## 2022-02-03 DIAGNOSIS — R531 Weakness: Secondary | ICD-10-CM | POA: Diagnosis present

## 2022-02-03 DIAGNOSIS — Z820 Family history of epilepsy and other diseases of the nervous system: Secondary | ICD-10-CM

## 2022-02-03 DIAGNOSIS — G062 Extradural and subdural abscess, unspecified: Secondary | ICD-10-CM | POA: Diagnosis present

## 2022-02-03 DIAGNOSIS — M79651 Pain in right thigh: Secondary | ICD-10-CM | POA: Diagnosis present

## 2022-02-03 DIAGNOSIS — Z79899 Other long term (current) drug therapy: Secondary | ICD-10-CM

## 2022-02-03 DIAGNOSIS — K219 Gastro-esophageal reflux disease without esophagitis: Secondary | ICD-10-CM | POA: Diagnosis present

## 2022-02-03 DIAGNOSIS — D509 Iron deficiency anemia, unspecified: Secondary | ICD-10-CM

## 2022-02-03 DIAGNOSIS — M960 Pseudarthrosis after fusion or arthrodesis: Secondary | ICD-10-CM | POA: Diagnosis present

## 2022-02-03 DIAGNOSIS — M5116 Intervertebral disc disorders with radiculopathy, lumbar region: Secondary | ICD-10-CM | POA: Diagnosis present

## 2022-02-03 DIAGNOSIS — R6510 Systemic inflammatory response syndrome (SIRS) of non-infectious origin without acute organ dysfunction: Principal | ICD-10-CM

## 2022-02-03 LAB — COMPREHENSIVE METABOLIC PANEL
ALT: 12 U/L (ref 0–44)
AST: 16 U/L (ref 15–41)
Albumin: 3.6 g/dL (ref 3.5–5.0)
Alkaline Phosphatase: 71 U/L (ref 38–126)
Anion gap: 14 (ref 5–15)
BUN: 26 mg/dL — ABNORMAL HIGH (ref 8–23)
CO2: 24 mmol/L (ref 22–32)
Calcium: 9.9 mg/dL (ref 8.9–10.3)
Chloride: 101 mmol/L (ref 98–111)
Creatinine, Ser: 1.11 mg/dL — ABNORMAL HIGH (ref 0.44–1.00)
GFR, Estimated: 56 mL/min — ABNORMAL LOW (ref >60)
Glucose, Bld: 151 mg/dL — ABNORMAL HIGH (ref 70–99)
Potassium: 3.7 mmol/L (ref 3.5–5.1)
Sodium: 139 mmol/L (ref 135–145)
Total Bilirubin: 0.6 mg/dL (ref 0.3–1.2)
Total Protein: 7.8 g/dL (ref 6.5–8.1)

## 2022-02-03 LAB — CBC
HCT: 31.1 % — ABNORMAL LOW (ref 36.0–46.0)
Hemoglobin: 9.9 g/dL — ABNORMAL LOW (ref 12.0–15.0)
MCH: 24.9 pg — ABNORMAL LOW (ref 26.0–34.0)
MCHC: 31.8 g/dL (ref 30.0–36.0)
MCV: 78.3 fL — ABNORMAL LOW (ref 80.0–100.0)
Platelets: 422 10*3/uL — ABNORMAL HIGH (ref 150–400)
RBC: 3.97 MIL/uL (ref 3.87–5.11)
RDW: 15.9 % — ABNORMAL HIGH (ref 11.5–15.5)
WBC: 10 10*3/uL (ref 4.0–10.5)
nRBC: 0 % (ref 0.0–0.2)

## 2022-02-03 LAB — URINALYSIS, ROUTINE W REFLEX MICROSCOPIC
Bilirubin Urine: NEGATIVE
Glucose, UA: NEGATIVE mg/dL
Ketones, ur: NEGATIVE mg/dL
Leukocytes,Ua: NEGATIVE
Nitrite: NEGATIVE
Protein, ur: 30 mg/dL — AB
Specific Gravity, Urine: 1.013 (ref 1.005–1.030)
pH: 5 (ref 5.0–8.0)

## 2022-02-03 LAB — RAPID URINE DRUG SCREEN, HOSP PERFORMED
Amphetamines: NOT DETECTED
Barbiturates: NOT DETECTED
Benzodiazepines: NOT DETECTED
Cocaine: NOT DETECTED
Opiates: NOT DETECTED
Tetrahydrocannabinol: NOT DETECTED

## 2022-02-03 LAB — PROTIME-INR
INR: 1.1 (ref 0.8–1.2)
Prothrombin Time: 14.2 seconds (ref 11.4–15.2)

## 2022-02-03 LAB — SARS CORONAVIRUS 2 BY RT PCR: SARS Coronavirus 2 by RT PCR: NEGATIVE

## 2022-02-03 LAB — LACTIC ACID, PLASMA
Lactic Acid, Venous: 1.3 mmol/L (ref 0.5–1.9)
Lactic Acid, Venous: 1.3 mmol/L (ref 0.5–1.9)

## 2022-02-03 LAB — APTT: aPTT: 35 seconds (ref 24–36)

## 2022-02-03 LAB — CBG MONITORING, ED: Glucose-Capillary: 164 mg/dL — ABNORMAL HIGH (ref 70–99)

## 2022-02-03 MED ORDER — LACTATED RINGERS IV BOLUS (SEPSIS): 1000.0000 mL | Freq: Once | INTRAVENOUS | Status: DC

## 2022-02-03 MED ORDER — SODIUM CHLORIDE 0.9 % IV SOLN
2.0000 g | Freq: Once | INTRAVENOUS | Status: AC
  Administered 2022-02-03: 2 g via INTRAVENOUS
  Filled 2022-02-03: qty 12.5

## 2022-02-03 MED ORDER — VANCOMYCIN HCL 1500 MG/300ML IV SOLN
1500.0000 mg | Freq: Once | INTRAVENOUS | Status: AC
  Administered 2022-02-03: 1500 mg via INTRAVENOUS
  Filled 2022-02-03: qty 300

## 2022-02-03 MED ORDER — LACTATED RINGERS IV BOLUS (SEPSIS)
1000.0000 mL | Freq: Once | INTRAVENOUS | Status: AC
  Administered 2022-02-03: 1000 mL via INTRAVENOUS

## 2022-02-03 MED ORDER — METRONIDAZOLE 500 MG/100ML IV SOLN
500.0000 mg | Freq: Once | INTRAVENOUS | Status: AC
  Administered 2022-02-03: 500 mg via INTRAVENOUS
  Filled 2022-02-03: qty 100

## 2022-02-03 MED ORDER — VANCOMYCIN HCL IN DEXTROSE 1-5 GM/200ML-% IV SOLN: 1000.0000 mg | Freq: Once | INTRAVENOUS | Status: DC

## 2022-02-03 MED ORDER — GADOBUTROL 1 MMOL/ML IV SOLN
10.0000 mL | Freq: Once | INTRAVENOUS | Status: AC | PRN
  Administered 2022-02-03: 10 mL via INTRAVENOUS

## 2022-02-03 MED ORDER — VANCOMYCIN HCL 750 MG/150ML IV SOLN
750.0000 mg | INTRAVENOUS | Status: DC
  Administered 2022-02-04: 750 mg via INTRAVENOUS
  Filled 2022-02-03: qty 150

## 2022-02-03 MED ORDER — ALBUTEROL SULFATE (2.5 MG/3ML) 0.083% IN NEBU: 3.0000 mL | INHALATION_SOLUTION | RESPIRATORY_TRACT | Status: DC | PRN

## 2022-02-03 MED ORDER — ONDANSETRON HCL 4 MG/2ML IJ SOLN
4.0000 mg | Freq: Once | INTRAMUSCULAR | Status: AC
  Administered 2022-02-03: 4 mg via INTRAVENOUS
  Filled 2022-02-03: qty 2

## 2022-02-03 MED ORDER — LACTATED RINGERS IV SOLN: INTRAVENOUS | Status: AC

## 2022-02-03 MED ORDER — MORPHINE SULFATE (PF) 2 MG/ML IV SOLN
2.0000 mg | Freq: Once | INTRAVENOUS | Status: AC
  Administered 2022-02-03: 2 mg via INTRAVENOUS
  Filled 2022-02-03: qty 1

## 2022-02-03 MED ORDER — HYDROMORPHONE HCL 1 MG/ML IJ SOLN
0.5000 mg | INTRAMUSCULAR | Status: DC | PRN
  Administered 2022-02-03 – 2022-02-04 (×9): 0.5 mg via INTRAVENOUS
  Filled 2022-02-03 (×4): qty 0.5
  Filled 2022-02-03: qty 1
  Filled 2022-02-03 (×4): qty 0.5

## 2022-02-03 MED ORDER — ACETAMINOPHEN 500 MG PO TABS
1000.0000 mg | ORAL_TABLET | Freq: Once | ORAL | Status: AC
  Administered 2022-02-03: 1000 mg via ORAL
  Filled 2022-02-03: qty 2

## 2022-02-03 MED ORDER — SODIUM CHLORIDE 0.9 % IV SOLN
2.0000 g | Freq: Two times a day (BID) | INTRAVENOUS | Status: DC
  Administered 2022-02-03 – 2022-02-04 (×3): 2 g via INTRAVENOUS
  Filled 2022-02-03 (×3): qty 12.5

## 2022-02-03 MED ORDER — METHOCARBAMOL 1000 MG/10ML IJ SOLN
500.0000 mg | Freq: Three times a day (TID) | INTRAVENOUS | Status: DC | PRN
  Administered 2022-02-03 – 2022-02-04 (×3): 500 mg via INTRAVENOUS
  Filled 2022-02-03 (×5): qty 5

## 2022-02-03 NOTE — ED Provider Notes (Signed)
[facility] EMERGENCY DEPARTMENT Provider Note   CSN: [id number] Arrival date & time: 02/03/22  0623     History  Chief Complaint  Patient presents with   Altered Mental Status    [name] is a 66 y.o. female.   Altered Mental Status Associated symptoms: fever      Patient with medical history of asthma, type 2 diabetes, hypertension, hyperlipidemia, recent lumbar fusion 01/13/2022 presents today due to altered mental status.  Patient was normal going to bed last night, patient's husband woke up this morning and patient was not answering questions normally.  She was shaking across her body, no history of seizures.  She answers some questions not others, patient endorses chills.  She has been having back pain since the surgery but it is not significantly changed.  Denies any recent cough, chest pain, shortness of breath, abdominal pain, dysuria, hematuria.  Home Medications Prior to Admission medications   Medication Sig Start Date End Date Taking? Authorizing Provider  acetaminophen (TYLENOL) 500 MG tablet Take 1,000 mg by mouth at bedtime as needed for mild pain or headache.   Yes [provider]  albuterol (PROAIR HFA) 108 (90 BASE) MCG/ACT inhaler Inhale 2 puffs into the lungs every 6 (six) hours as needed. 07/21/13  Yes Roselee Culver, MD  amLODipine (NORVASC) 10 MG tablet Take 1 tablet (10 mg total) by mouth daily. 11/23/17  Yes [name], PA-C  celecoxib (CELEBREX) 200 MG capsule Take 200 mg by mouth 2 (two) times daily.  10/21/17  Yes [provider]  cetirizine (ZYRTEC) 10 MG tablet Take 1 tablet (10 mg total) by mouth daily. 05/06/17  Yes [name], PA-C  cyanocobalamin (VITAMIN B12) 1000 MCG tablet Take 1,000 mcg by mouth daily.   Yes [provider]  diclofenac (VOLTAREN) 75 MG EC tablet Take 75 mg by mouth 2 (two) times daily as needed (gout pain). 08/04/21  Yes [provider]  docusate sodium (COLACE)  100 MG capsule Take 100 mg by mouth 2 (two) times daily.   Yes [provider]  gabapentin (NEURONTIN) 600 MG tablet Take 600 mg by mouth 3 (three) times daily. 12/01/21  Yes [provider]  iron polysaccharides (NIFEREX) 150 MG capsule TAKE 1 CAPSULE (150 MG DOSE) BY MOUTH TWICE A DAY Patient taking differently: Take 150 mg by mouth daily. 12/19/19 04/03/22 Yes Falstreau, Blaine Hamper, NP  losartan-hydrochlorothiazide (HYZAAR) 100-25 MG tablet Take 1 tablet by mouth daily. 11/23/17  Yes [name], PA-C  metFORMIN (GLUCOPHAGE) 1000 MG tablet Take 1 tablet (1,000 mg total) by mouth 2 (two) times daily with a meal. 05/01/21  Yes   metoprolol succinate (TOPROL-XL) 100 MG 24 hr tablet Take 1 tablet (100 mg total) by mouth daily. Patient taking differently: Take 100 mg by mouth at bedtime. 02/25/21  Yes   oxyCODONE (OXY IR/ROXICODONE) 5 MG immediate release tablet Take 1 tablet (5 mg total) by mouth every 6 (six) hours as needed for moderate pain ((score 4 to 6)). 01/19/22  Yes Ashok Pall, MD  pantoprazole (PROTONIX) 40 MG tablet Take 40 mg by mouth daily. 12/28/21  Yes [provider]  tiZANidine (ZANAFLEX) 4 MG tablet Take 4 mg by mouth at bedtime as needed for muscle spasms.   Yes [provider]      Allergies    Crestor [rosuvastatin], Robaxin [methocarbamol], Zocor [simvastatin], Lipitor [atorvastatin], and Sulfa antibiotics    Review of Systems   Review of Systems  Constitutional:  Positive for fever.    Physical Exam Updated Vital Signs BP (!) 138/52   Pulse (!) 110   Temp 100.2 F (37.9 C) (Oral)   Resp 20   Ht '4\' 10"'$  (1.473 m)   Wt 87.1 kg   SpO2 97%   BMI 40.13 kg/m  Physical Exam Vitals and nursing note reviewed. Exam conducted with a chaperone present.  Constitutional:      Appearance: Normal appearance. She is obese. She is ill-appearing.  HENT:     Head: Normocephalic and atraumatic.  Eyes:     General: No scleral icterus.       Right  eye: No discharge.        Left eye: No discharge.     Extraocular Movements: Extraocular movements intact.     Pupils: Pupils are equal, round, and reactive to light.  Cardiovascular:     Rate and Rhythm: Regular rhythm. Tachycardia present.     Pulses: Normal pulses.     Heart sounds: Murmur heard.     No friction rub. No gallop.  Pulmonary:     Effort: Pulmonary effort is normal.     Breath sounds: Normal breath sounds.  Abdominal:     General: Abdomen is flat. Bowel sounds are normal. There is no distension.     Palpations: Abdomen is soft.     Tenderness: There is no abdominal tenderness.  Skin:    General: Skin is warm and dry.     Coloration: Skin is not jaundiced.     Comments: Surgical scar over lumbar spine.  No surrounding erythema or purulent drainage   Neurological:     Mental Status: She is alert. Mental status is at baseline.     Coordination: Coordination normal.     Comments: Patient is oriented x3.  Answers some questions, follows most commands.  Cranial nerves II through XII are grossly intact.  Grips strength and lower extremity strength is symmetric but reduced.     ED Results / Procedures / Treatments   Labs (all labs ordered are listed, but only abnormal results are displayed) Labs Reviewed  COMPREHENSIVE METABOLIC PANEL - Abnormal; Notable for the following components:      Result Value   Glucose, Bld 151 (*)    BUN 26 (*)    Creatinine, Ser 1.11 (*)    GFR, Estimated 56 (*)    All other components within normal limits  CBC - Abnormal; Notable for the following components:   Hemoglobin 9.9 (*)    HCT 31.1 (*)    MCV 78.3 (*)    MCH 24.9 (*)    RDW 15.9 (*)    Platelets 422 (*)    All other components within normal limits  URINALYSIS, ROUTINE W REFLEX MICROSCOPIC - Abnormal; Notable for the following components:   Hgb urine dipstick MODERATE (*)    Protein, ur 30 (*)    Bacteria, UA RARE (*)    All other components within normal limits   CULTURE, BLOOD (ROUTINE X 2)  CULTURE, BLOOD (ROUTINE X 2)  URINE CULTURE  SARS CORONAVIRUS 2 BY RT PCR  RESP PANEL BY RT-PCR (FLU A&B, COVID) [id number]  RAPID URINE DRUG SCREEN, HOSP PERFORMED  LACTIC ACID, PLASMA  PROTIME-INR  APTT  LACTIC ACID, PLASMA  CBG MONITORING, ED    EKG EKG Interpretation  Date/Time:  Wednesday February 03 2022 06:19:34 EDT Ventricular Rate:  127 PR Interval:  143 QRS Duration: 67 QT Interval:  289 QTC Calculation: 420 R  Axis:   19 Text Interpretation: Sinus tachycardia Ventricular premature complex Aberrant complex LVH with secondary repolarization abnormality Confirmed by Orpah Greek [phone]) on 02/03/2022 6:23:59 AM  Radiology CT Head Wo Contrast  Result Date: 02/03/2022 CLINICAL DATA:  66 year old female with altered mental status. EXAM: CT HEAD WITHOUT CONTRAST TECHNIQUE: Contiguous axial images were obtained from the base of the skull through the vertex without intravenous contrast. RADIATION DOSE REDUCTION: This exam was performed according to the departmental dose-optimization program which includes automated exposure control, adjustment of the mA and/or kV according to patient size and/or use of iterative reconstruction technique. COMPARISON:  None Available. FINDINGS: Brain: Cerebral volume is within normal limits for age. No midline shift, ventriculomegaly, mass effect, evidence of mass lesion, intracranial hemorrhage or evidence of cortically based acute infarction. Patchy, mild to moderate mostly periatrial white matter hypodensity. Otherwise normal gray-white matter differentiation throughout the brain. No cortical encephalomalacia identified. Vascular: Calcified atherosclerosis at the skull base. No suspicious intracranial vascular hyperdensity. Skull: Negative. Sinuses/Orbits: Visualized paranasal sinuses and mastoids are clear. Other: No acute orbit or scalp soft tissue finding. IMPRESSION: 1. No acute intracranial abnormality. 2. Mild  to moderate for age cerebral white matter changes, most commonly due to chronic small vessel disease. Electronically Signed   By: Genevie Ann M.D.   On: 02/03/2022 08:05   CT Lumbar Spine Wo Contrast  Result Date: 02/03/2022 CLINICAL DATA:  66 year old female with prior spine surgery. Infection suspected. EXAM: CT LUMBAR SPINE WITHOUT CONTRAST TECHNIQUE: Multidetector CT imaging of the lumbar spine was performed without intravenous contrast administration. Multiplanar CT image reconstructions were also generated. RADIATION DOSE REDUCTION: This exam was performed according to the departmental dose-optimization program which includes automated exposure control, adjustment of the mA and/or kV according to patient size and/or use of iterative reconstruction technique. COMPARISON:  CT lumbar myelogram 12/23/2021. FINDINGS: Segmentation: Normal, the same numbering system used on the CT last month. Alignment: Increased lumbar lordosis, increased slight anterolisthesis of L3 on L4 since last month. Stable upper lumbar and thoracolumbar junction height and alignment. Vertebrae: Interval postoperative changes at L3-L4. See additional details below. T11 through L2 appears stable and intact. Visible sacrum and SI joints appear stable and intact. Paraspinal and other soft tissues: Aortoiliac calcified atherosclerosis. Stable visible noncontrast abdominal viscera. New ventral prevertebral soft tissue inflammation at the L4 level (series 3, image 80). Moderate bilateral paraspinal muscle enlargement and edema is new from last month, although interval postoperative changes near that level. No organized fluid collection is evident in the absence of contrast. Disc levels: Visible lower thoracic levels through L1-L2 appears stable since last month. L2-L3: Confluent new paraspinal soft tissue edema or inflammation (series 3, image 57). L2 vertebra and the posterior elements here appears stable. Abnormal L3 vertebra, further detailed  below. L3-L4: New bilateral pedicle screws and interbody implant at this level. Transverse fracture of the posterior vertebral body with mild displacement of the posterosuperior endplate. See series 8, image 46. bilateral L3 pedicle screws appear intact although with bilateral vertebral fracture or lucency along the course of both screws (coronal image 30 and sagittal images 41 and 56. Anteriorly positioned and somewhat anteriorly displaced interbody implant (sagittal image 50). The L3 inferior endplate appears stable from last month. L4 superior endplate appears stable from last month. L4 pedicle screws have been upsized. There is ongoing lucency along the left L4 screw which communicates with the disc space (sagittal image 56). Improved appearance of the right L4 screw since last month. L4-L5: L4  pedicle screws detailed above. Stable interbody implant at this level. Stable endplates. Stable posterior elements with previous posterior decompression. L5-S1: Chronic decompression and fusion. L5 pedicle screws appear stable and satisfactory. S1 pedicle screws have been upsized. Some residual S1 pedicle screw lucency. Stable interbody implant. Stable endplates. IMPRESSION: 1. Interval postoperative changes at L3-L4 since CT myelogram last month. Acute to subacute Fractures of the posterior L3 body and bilateral posterior elements, including fracture and/or lucency along the course of both L3 pedicle screws. Anteriorly displaced L3-L4 interbody implant. New bilateral paraspinal soft tissue edema and inflammation (including new prevertebral inflammation at L4) which may indicate Acute Spinal Infection, but no new endplate erosion since last month. 2. Bilateral L4 and S1 pedicle screws have been up-sized since last month due to hardware loosening. Continued lucency along the left L4 screw. 3. Otherwise stable CT appearance of L4-L5 and L5-S1 decompression and fusion since last month. 4. Visible lower thoracic levels  through L2 appears stable. 5. Aortic Atherosclerosis (ICD10-I70.0). Electronically Signed   By: Genevie Ann M.D.   On: 02/03/2022 08:03   DG Chest Port 1 View  Result Date: 02/03/2022 CLINICAL DATA:  66 year old female with possible sepsis. EXAM: PORTABLE CHEST 1 VIEW COMPARISON:  Chest radiographs 10/09/2012. FINDINGS: Portable AP semi upright view at 0636 hours. Lower lung volumes. Mediastinal contours remain normal. Visualized tracheal air column is within normal limits. Allowing for portable technique the lungs are clear. No pneumothorax or pleural effusion. Negative visible bowel gas. No acute osseous abnormality identified. IMPRESSION: No acute cardiopulmonary abnormality. Electronically Signed   By: Genevie Ann M.D.   On: 02/03/2022 07:11    Procedures .Critical Care  Performed by: Sherrill Raring, PA-C Authorized by: Sherrill Raring, PA-C   Critical care provider statement:    Critical care time (minutes):  30   Critical care start time:  02/03/2022 8:00 AM   Critical care end time:  02/03/2022 8:41 AM   Critical care time was exclusive of:  Separately billable procedures and treating other patients   Critical care was necessary to treat or prevent imminent or life-threatening deterioration of the following conditions:  Sepsis   Critical care was time spent personally by me on the following activities:  Development of treatment plan with patient or surrogate, discussions with consultants, evaluation of patient's response to treatment, examination of patient, ordering and review of laboratory studies, ordering and review of radiographic studies, ordering and performing treatments and interventions, pulse oximetry, re-evaluation of patient's condition and review of old charts   Care discussed with: admitting provider       Medications Ordered in ED Medications  lactated ringers infusion ( Intravenous New Bag/Given 02/03/22 0907)  lactated ringers bolus 1,000 mL (0 mLs Intravenous Stopped 02/03/22 0846)     And  lactated ringers bolus 1,000 mL (0 mLs Intravenous Hold 02/03/22 0723)  vancomycin (VANCOREADY) IVPB 1500 mg/300 mL (1,500 mg Intravenous New Bag/Given 02/03/22 0908)  ceFEPIme (MAXIPIME) 2 g in sodium chloride 0.9 % 100 mL IVPB (has no administration in time range)  vancomycin (VANCOREADY) IVPB 750 mg/150 mL (has no administration in time range)  acetaminophen (TYLENOL) tablet 1,000 mg (1,000 mg Oral Given 02/03/22 0643)  ceFEPIme (MAXIPIME) 2 g in sodium chloride 0.9 % 100 mL IVPB (0 g Intravenous Stopped 02/03/22 0800)  metroNIDAZOLE (FLAGYL) IVPB 500 mg (0 mg Intravenous Stopped 02/03/22 0903)  morphine (PF) 2 MG/ML injection 2 mg (2 mg Intravenous Given 02/03/22 0840)  ondansetron (ZOFRAN) injection 4 mg (4 mg Intravenous  Given 02/03/22 0840)    ED Course/ Medical Decision Making/ A&P Clinical Course as of 02/03/22 0926  Wed Feb 03, 2022  0716 CBC(!) Patient is anemic 9.9, roughly 2 unit drop compared to 3 weeks ago.  No leukocytosis. [HS]  0716 APTT PTT within normal limits [HS]  0716 Protime-INR PT and INR within normal limits [HS]  0717 DG Chest [facility] Chest x-ray ordered, viewed interpreted by myself.  No focal consolidation, no pneumothorax or acute process.  Agree with radiologist interpretation. [HS]  0732 EKG 12-Lead EKG shows sinus tachycardia.  Patient remains on cardiac monitoring is in sinus tachycardia on the monitor per my interpretation. [HS]  [id number] Comprehensive metabolic panel(!) No gross electrolyte derangement, slight elevation of creatinine at 1.1.  Slightly hyperglycemic at 151 but no signs of DKA. [HS]  0842 Lactic acid, plasma Lactic is negative, 1.3. [HS]    Clinical Course User Index [HS] Sherrill Raring, PA-C                           Medical Decision Making Amount and/or Complexity of Data Reviewed External Data Reviewed: labs, radiology, ECG and notes. Labs: ordered. Decision-making details documented in ED Course. Radiology: ordered and  independent interpretation performed. Decision-making details documented in ED Course. ECG/medicine tests: ordered and independent interpretation performed. Decision-making details documented in ED Course.  Risk OTC drugs. Prescription drug management. Decision regarding hospitalization.   Patient initially presented due to confusion per husband.  She was found to be febrile and tachycardic.  Differential includes but not limited to sepsis, UTI, postoperative infection, pneumonia.  Patient has been is an independent historian.  Nursing note and EMS report are also independent historians.  I reviewed external record and saw patient had surgery 01/13/2022 for removal of some hardware, screws are still intact to the lumbar spine.  On exam patient is febrile, tachycardic and tachypneic.  Lungs are clear to auscultation, there are no appreciable focal deficits and she is oriented for me.  She is slightly hypertensive.  She meets SIRS criteria so sepsis work-up was initiated.    I reviewed patient's medical history as well as her medication list.  I ordered broad spectrum antibiotics and LR fluid bolus.   I ordered, viewed and interpreted imaging and laboratory work-up as documented in ED course.  CXR negative.  CT Head wo contrast no acute process.  CT lumbar without contrast is concerning for new bilateral paraspinal soft tissue edema and inflammation which could be indicative of an acute spinal infection.  Agree with radiologist interpretation.  I consulted with neurosurgery.  They advised MRI with and without lumbar they will consult on the patient.    Spoke with Dr. Alfonse Spruce with the internal medicine service who agrees to admit the patient.        Final Clinical Impression(s) / ED Diagnoses Final diagnoses:  SIRS (systemic inflammatory response syndrome) (HCC)  Confusion  Low back pain, unspecified back pain laterality, unspecified chronicity, unspecified whether sciatica present     Rx / DC Orders ED Discharge Orders     None         Sherrill Raring, PA-C 02/03/22 0355    Valarie Merino, MD 02/03/22 [phone]

## 2022-02-03 NOTE — ED Notes (Signed)
Unable to obtain 2nd set of blood cultures prior to antibiotics

## 2022-02-03 NOTE — ED Notes (Signed)
MD only wanted two liters, patient received 1 liter NS and 1 liter LR

## 2022-02-03 NOTE — Progress Notes (Signed)
Pharmacy Antibiotic Note  [name] is a 66 y.o. female admitted on 02/03/2022 with AMS and possible infection. Pharmacy has been consulted for vancomycin and cefepime dosing.  Plan: Cefepime 2g IV q12h Vancomycin '1500mg'$  IV x1 then '750mg'$  IV q24h - est AUC 541 Follow Cr, LOT, Cx  Height: '4\' 10"'$  (147.3 cm) Weight: 87.1 kg (192 lb) IBW/kg (Calculated) : 40.9  Temp (24hrs), Avg:102.7 F (39.3 C), Min:102.7 F (39.3 C), Max:102.7 F (39.3 C)  No results for input(s): "WBC", "CREATININE", "LATICACIDVEN", "VANCOTROUGH", "VANCOPEAK", "VANCORANDOM", "GENTTROUGH", "GENTPEAK", "GENTRANDOM", "TOBRATROUGH", "TOBRAPEAK", "TOBRARND", "AMIKACINPEAK", "AMIKACINTROU", "AMIKACIN" in the last 168 hours.  CrCl cannot be calculated (Patient's most recent lab result is older than the maximum 21 days allowed.).    Allergies  Allergen Reactions   Methocarbamol Other (See Comments)    Insomnia; Kept pt awake for 2 days   Rosuvastatin Other (See Comments)    myalgia   Simvastatin     Leg Cramps   Atorvastatin Other (See Comments)    Myalgia   Sulfa Antibiotics Hives    Antimicrobials this admission: Vancomycin 9/13 >> Cefepime 9/13 >>  Microbiology results: pending  Thank you for allowing pharmacy to be a part of this patient's care.   Arrie Senate, PharmD, BCPS, [facility] Clinical Pharmacist Please check AMION for all [facility] Pharmacy numbers 02/03/2022

## 2022-02-03 NOTE — ED Notes (Signed)
Patient transported to MRI 

## 2022-02-03 NOTE — ED Notes (Signed)
Spouse bedside, reports pt was "shaking, like a seizure this morning as well.

## 2022-02-03 NOTE — ED Triage Notes (Signed)
Per EMS, pt's husband was unable to waken pt this moring.  He noted that she did get up to the use the bathroom at 3am and "felt that something was wrong".  He also noted that pt did take a muscle relaxer at that time.  She is now responding to name however she is not answering any questions.    160/80 HR 120 95% RA RR 24

## 2022-02-03 NOTE — H&P (Signed)
Date: 02/03/2022               Patient Name:  [name] MRN: [id number]  DOB: 10/19/1955 Age / Sex: 66 y.o., female   PCP: Chesley Noon, MD         Medical Service: Internal Medicine Teaching Service         Attending Physician: Dr. Charise Killian, MD    First Contact: Dr. Iona Coach Pager: [phone]  Second Contact: Dr. Linwood Dibbles Pager: [phone]       After Hours (After 5p/  First Contact Pager: [phone]  weekends / holidays): Second Contact Pager: [phone]   Chief Complaint: Altered mental status  History of Present Illness:   [name] is a 66 year old female with past medical history of hypertension, type 2 diabetes, asthma, hyperlipidemia, IDA, aortic stenosis, lumbar spondylolisthesis status post lumbar fusion who was brought to the ED due to altered mental status.  Patient has chronic lumbar radiculopathy who underwent lumbar fusion of L4-L5 and L5-S1 in 2019.  She also have laminectomy of L3-L4.  Patient underwent L3-L4 fusion and pedicle screw removal of L4, S1 and placement of new screw at L3, L4 in 01/19/22.  Husband states that patient was in her usual state of health until last night when she was shivering and confused and not responding, which prompted him to call EMS.  Patient states that her back pain has gotten worse especially in the last week with worsened muscle spasm as well.  States that her back feels heavier.  She endorses fever and chills in the last few days.  She denies any change in strength and sensation of bilateral lower extremity.  States that her right leg is weaker than the left leg but there is no acute change.  She denies any bowel or urinary incontinence.  Patient ambulating with a walker.  Her husband helps change the dressing of her surgical wound.  He states that there was bleeding initially but has stopped.  He denies any purulent discharge from the wound.  She denies headache, chest pain, shortness of breath, coughing,  upper respiratory tract symptoms, abdominal pain, nausea, vomiting, dysuria, frequency, diarrhea or constipation.  In the ED, patient was febrile at 102.7 and tachycardic and tachypneic.  Chest x-ray was unremarkable.  CT head negative.  CT lumbar showed acute/subacute fracture of the posterior L3 body and pedicle screws.  There is new bilateral paraspinal soft tissue edema inflammation suspicious for acute spinal infection.  She was started on vancomycin, cefepime and metronidazole.  Neurosurgery was consulted.  Patient admitted for sepsis likely secondary to spinal infection.  Meds:  Current Meds  Medication Sig   acetaminophen (TYLENOL) 500 MG tablet Take 1,000 mg by mouth at bedtime as needed for mild pain or headache.   albuterol (PROAIR HFA) 108 (90 BASE) MCG/ACT inhaler Inhale 2 puffs into the lungs every 6 (six) hours as needed.   amLODipine (NORVASC) 10 MG tablet Take 1 tablet (10 mg total) by mouth daily.   celecoxib (CELEBREX) 200 MG capsule Take 200 mg by mouth 2 (two) times daily.    cetirizine (ZYRTEC) 10 MG tablet Take 1 tablet (10 mg total) by mouth daily.   cyanocobalamin (VITAMIN B12) 1000 MCG tablet Take 1,000 mcg by mouth daily.   diclofenac (VOLTAREN) 75 MG EC tablet Take 75 mg by mouth 2 (two) times daily as needed (gout pain).   docusate sodium (COLACE) 100 MG capsule Take 100 mg by mouth 2 (  two) times daily.   gabapentin (NEURONTIN) 600 MG tablet Take 600 mg by mouth 3 (three) times daily.   iron polysaccharides (NIFEREX) 150 MG capsule TAKE 1 CAPSULE (150 MG DOSE) BY MOUTH TWICE A DAY (Patient taking differently: Take 150 mg by mouth daily.)   losartan-hydrochlorothiazide (HYZAAR) 100-25 MG tablet Take 1 tablet by mouth daily.   metFORMIN (GLUCOPHAGE) 1000 MG tablet Take 1 tablet (1,000 mg total) by mouth 2 (two) times daily with a meal.   metoprolol succinate (TOPROL-XL) 100 MG 24 hr tablet Take 1 tablet (100 mg total) by mouth daily. (Patient taking differently: Take 100  mg by mouth at bedtime.)   oxyCODONE (OXY IR/ROXICODONE) 5 MG immediate release tablet Take 1 tablet (5 mg total) by mouth every 6 (six) hours as needed for moderate pain ((score 4 to 6)).   pantoprazole (PROTONIX) 40 MG tablet Take 40 mg by mouth daily.   tiZANidine (ZANAFLEX) 4 MG tablet Take 4 mg by mouth at bedtime as needed for muscle spasms.   [DISCONTINUED] cyclobenzaprine (FLEXERIL) 5 MG tablet Take 5 mg by mouth at bedtime as needed for muscle spasms.     Allergies: Allergies as of 02/03/2022 - Review Complete 02/03/2022  Allergen Reaction Noted   Crestor [rosuvastatin] Other (See Comments) 11/15/2021   Robaxin [methocarbamol] Other (See Comments) 03/03/2021   Zocor [simvastatin] Other (See Comments) 01/08/2022   Lipitor [atorvastatin] Other (See Comments) 06/14/2017   Sulfa antibiotics Hives 07/04/2011   Past Medical History:  Diagnosis Date   Allergy    Anemia    Anxiety    Aortic stenosis    moderate AS by echo 12/2021   Arthritis    Asthma    BMI 40.0-44.9, adult ([place]) 04/07/2014   DM type 2 (diabetes mellitus, type 2) (HCC)    Dyspnea    GERD (gastroesophageal reflux disease)    Heart murmur    HTN (hypertension)    Hypercholesterolemia    Osteoarthritis of left hip 04/07/2014   Reflux    Spinal headache    with C-Section and with spinal fusion in 2019    Family History:  Family History  Problem Relation Age of Onset   Hypertension Mother    Stroke Mother    Heart disease Father    Stroke Brother    Multiple sclerosis Brother    Multiple sclerosis Sister    Colon cancer Neg Hx    Colon polyps Neg Hx    Esophageal cancer Neg Hx    Rectal cancer Neg Hx    Stomach cancer Neg Hx      Social History:  -Lives with husband -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 Anastasia Pall at [place]: A complete ROS was negative except as per HPI.   Physical  Exam: Blood pressure 108/62, pulse (!) 107, temperature 100.2 F (37.9 C), temperature source Oral, resp. rate (!) 23, height '4\' 10"'$  (1.473 m), weight 87.1 kg, SpO2 98 %. Physical Exam Constitutional:      General: She is not in acute distress.    Appearance: She is not ill-appearing.     Comments: Alert and oriented x4  HENT:     Head: Normocephalic.  Eyes:     General:        Right eye: No discharge.        Left eye: No discharge.     Conjunctiva/sclera: Conjunctivae  normal.  Cardiovascular:     Rate and Rhythm: Regular rhythm. Tachycardia present.     Heart sounds: Murmur (Loud crescendo decrescendo systolic murmur heard best at the upper sternal border) heard.  Pulmonary:     Effort: Pulmonary effort is normal. No respiratory distress.     Breath sounds: Normal breath sounds. No wheezing or rales.  Abdominal:     General: Bowel sounds are normal. There is no distension.     Palpations: Abdomen is soft.     Tenderness: There is no abdominal tenderness.  Musculoskeletal:        General: No deformity.     Cervical back: Normal range of motion.     Right lower leg: No edema.     Left lower leg: No edema.     Comments: No tenderness to palpation of midline lumbar or paraspinal muscle  Skin:    General: Skin is warm.     Coloration: Skin is not jaundiced.     Comments: Midline surgical scar appears clean and noninfected.  No active discharge.  Neurological:     Mental Status: She is alert.     Comments: 5/5 strength of left LE 3-4/5 strength of right LE  Psychiatric:        Mood and Affect: Mood normal.        Behavior: Behavior normal.        EKG: personally reviewed my interpretation is sinus tachycardia  CXR: personally reviewed my interpretation is unremarkable  Assessment & Plan by Problem: Principal Problem:   Sepsis ([place]) Active Problems:   DM type 2 (diabetes mellitus, type 2) (HCC)   HTN (hypertension)   S/P lumbar spinal fusion   Lumbar adjacent  segment disease with spondylolisthesis   Iron deficiency anemia  [name] is a 66 year old female with past medical history of hypertension, type 2 diabetes, asthma, hyperlipidemia, IDA, aortic stenosis, lumbar spondylolisthesis status post lumbar fusion who was brought to the ED due to altered mental status, admitted for sepsis secondary to possible spinal infection.  Sepsis 3/4 SIRS criteria High suspicion for spinal infection Lumbar spondylolisthesis status post lumbar spinal fusion Patient is admitted for sepsis secondary to possible spinal infection from recent L3-L4 lumbar fusion with screw placement of L3, L4.  Fortunately patient does not have lactic acidosis or septic shock.  Her mentation has improved.  Lumbar MRI was ordered to confirm infection.  There is no acute change in her lower extremity neuro exam. -Neurosurgery was consulted and will evaluate patient -Patient has received fluid per sepsis protocol -Continue vancomycin and cefepime -Pending blood culture.  Second set was obtained about 1 hour after starting antibiotic -Pending lumbar MRI -N.p.o. -Pain control with Dilaudid and Robaxin IV as needed  Iron deficiency anemia Last iron panel was in 2014 which showed iron saturation of 12.  Patient is taking iron supplement at home.  Per notes, patient has IDA from inadequate intake.  Colonoscopy in 2019 was unremarkable.  She does have GERD so may need an EGD outpatient. -Recheck iron panel and ferritin  Hypertension Holding home hypertension medication in the setting of sepsis, low normal blood pressure and n.p.o.  Type 2 diabetes Diabetes well controlled with A1c of 6.2 3 weeks ago.  She only takes metformin at home. -Holding metformin while n.p.o. -CBG daily  Moderate aortic stenosis States that she has this murmur since she was a teenager.  She has been following up with Dr. Johnsie Cancel with cardiology -Follow-up with cardiology  Full code  Diet: N.p.o. IVF:  LR DVT: SCD pending neurosurgery evaluation  Dispo: Admit patient to Inpatient with expected length of stay greater than 2 midnights.  Signed: Gaylan Gerold, DO 02/03/2022, 10:29 AM  Pager: [phone] After 5pm on weekdays and 1pm on weekends: On Call pager: [phone]

## 2022-02-03 NOTE — Discharge Planning (Signed)
RNCM following for disposition needs. °Tewana Bohlen J. Emaleigh Guimond, RN, BSN, NCM [phone] ° °

## 2022-02-03 NOTE — Consult Note (Signed)
Reason for Consult:post operative pain and hardware failure Referring Physician: [name], [name] is an 66 y.o. female.  HPI: whom was taken to the operating room on 8/23  post op she is complaining of lower extremity pain and left groin pain. Seen in the office yesterday, husband took her to the ED secondary to her pain. Wound is clean, and dry  Past Medical History:  Diagnosis Date   Allergy    Anemia    Anxiety    Aortic stenosis    moderate AS by echo 12/2021   Arthritis    Asthma    BMI 40.0-44.9, adult ([place]) 04/07/2014   DM type 2 (diabetes mellitus, type 2) (HCC)    Dyspnea    GERD (gastroesophageal reflux disease)    Heart murmur    HTN (hypertension)    Hypercholesterolemia    Osteoarthritis of left hip 04/07/2014   Reflux    Spinal headache    with C-Section and with spinal fusion in 2019    Past Surgical History:  Procedure Laterality Date   CESAREAN SECTION     x3   COLONOSCOPY     LAMINECTOMY  03/2021   SPINAL FUSION  2019   TUBAL LIGATION      Family History  Problem Relation Age of Onset   Hypertension Mother    Stroke Mother    Heart disease Father    Stroke Brother    Multiple sclerosis Brother    Multiple sclerosis Sister    Colon cancer Neg Hx    Colon polyps Neg Hx    Esophageal cancer Neg Hx    Rectal cancer Neg Hx    Stomach cancer Neg Hx     Social History:  reports that she has never smoked. She has never used smokeless tobacco. She reports that she does not drink alcohol and does not use drugs.  Allergies:  Allergies  Allergen Reactions   Crestor [Rosuvastatin] Other (See Comments)    Myalgia    Robaxin [Methocarbamol] Other (See Comments)    Insomnia   Zocor [Simvastatin] Other (See Comments)    Myalgias    Lipitor [Atorvastatin] Other (See Comments)    Myalgia   Sulfa Antibiotics Hives    Medications: I have reviewed the patient's current medications.  Results for orders placed or performed during the  hospital encounter of 02/03/22 (from the past 48 hour(s))  SARS Coronavirus 2 by RT PCR (hospital order, performed in [facility] hospital lab) *cepheid single result test* Anterior Nasal Swab     Status: None   Collection Time: 02/03/22  6:26 AM   Specimen: Anterior Nasal Swab  Result Value Ref Range   SARS Coronavirus 2 by RT PCR NEGATIVE NEGATIVE    Comment: (NOTE) SARS-CoV-2 target nucleic acids are NOT DETECTED.  The SARS-CoV-2 RNA is generally detectable in upper and lower respiratory specimens during the acute phase of infection. The lowest concentration of SARS-CoV-2 viral copies this assay can detect is 250 copies / mL. A negative result does not preclude SARS-CoV-2 infection and should not be used as the sole basis for treatment or other patient management decisions.  A negative result may occur with improper specimen collection / handling, submission of specimen other than nasopharyngeal swab, presence of viral mutation(s) within the areas targeted by this assay, and inadequate number of viral copies (<250 copies / mL). A negative result must be combined with clinical observations, patient history, and epidemiological information.  Fact Sheet for Patients:  https://www.patel.info/  Fact Sheet for Healthcare Providers: https://hall.com/  This test is not yet approved or  cleared by the [place] FDA and has been authorized for detection and/or diagnosis of SARS-CoV-2 by FDA under an Emergency Use Authorization (EUA).  This EUA will remain in effect (meaning this test can be used) for the duration of the COVID-19 declaration under Section 564(b)(1) of the Act, 21 U.S.C. section 360bbb-3(b)(1), unless the authorization is terminated or revoked sooner.  Performed at [facility], [place] [address] [place], [place] [address]   Comprehensive metabolic panel     Status: Abnormal   Collection Time: 02/03/22  6:27 AM  Result  Value Ref Range   Sodium 139 135 - 145 mmol/L   Potassium 3.7 3.5 - 5.1 mmol/L   Chloride 101 98 - 111 mmol/L   CO2 24 22 - 32 mmol/L   Glucose, Bld 151 (H) 70 - 99 mg/dL    Comment: Glucose reference range applies only to samples taken after fasting for at least 8 hours.   BUN 26 (H) 8 - 23 mg/dL   Creatinine, Ser 1.11 (H) 0.44 - 1.00 mg/dL   Calcium 9.9 8.9 - 10.3 mg/dL   Total Protein 7.8 6.5 - 8.1 g/dL   Albumin 3.6 3.5 - 5.0 g/dL   AST 16 15 - 41 U/L   ALT 12 0 - 44 U/L   Alkaline Phosphatase 71 38 - 126 U/L   Total Bilirubin 0.6 0.3 - 1.2 mg/dL   GFR, Estimated 56 (L) >60 mL/min    Comment: (NOTE) Calculated using the CKD-EPI Creatinine Equation (2021)    Anion gap 14 5 - 15    Comment: Performed at [facility] [address], [place], [place] [address]  CBC     Status: Abnormal   Collection Time: 02/03/22  6:27 AM  Result Value Ref Range   WBC 10.0 4.0 - 10.5 K/uL   RBC 3.97 3.87 - 5.11 MIL/uL   Hemoglobin 9.9 (L) 12.0 - 15.0 g/dL   HCT 31.1 (L) 36.0 - 46.0 %   MCV 78.3 (L) 80.0 - 100.0 fL   MCH 24.9 (L) 26.0 - 34.0 pg   MCHC 31.8 30.0 - 36.0 g/dL   RDW 15.9 (H) 11.5 - 15.5 %   Platelets 422 (H) 150 - 400 K/uL   nRBC 0.0 0.0 - 0.2 %    Comment: Performed at [facility] [address] [place], [place] [address]  Lactic acid, plasma     Status: None   Collection Time: 02/03/22  6:27 AM  Result Value Ref Range   Lactic Acid, Venous 1.3 0.5 - 1.9 mmol/L    Comment: Performed at [facility] [address], [place], [place] [address]  Protime-INR     Status: None   Collection Time: 02/03/22  6:27 AM  Result Value Ref Range   Prothrombin Time 14.2 11.4 - 15.2 seconds   INR 1.1 0.8 - 1.2    Comment: (NOTE) INR goal varies based on device and disease states. Performed at [facility], [place] [address], [place], [place] [address]   APTT     Status: None   Collection Time: 02/03/22  6:27 AM  Result Value Ref Range   aPTT 35 24 - 36  seconds    Comment: Performed at [facility] [address], [place], [place] [address]  Blood Culture (routine x 2)     Status:  None (Preliminary result)   Collection Time: 02/03/22  6:27 AM   Specimen: BLOOD  Result Value Ref Range   Specimen Description BLOOD LEFT ANTECUBITAL    Special Requests      BOTTLES DRAWN AEROBIC AND ANAEROBIC Blood Culture results may not be optimal due to an excessive volume of blood received in culture bottles   Culture      NO GROWTH <12 HOURS Performed at [facility] [address], [place], [place] [address]    Report Status PENDING   Urinalysis, Routine w reflex microscopic In/Out Cath Urine     Status: Abnormal   Collection Time: 02/03/22  8:15 AM  Result Value Ref Range   Color, Urine YELLOW YELLOW   APPearance CLEAR CLEAR   Specific Gravity, Urine 1.013 1.005 - 1.030   pH 5.0 5.0 - 8.0   Glucose, UA NEGATIVE NEGATIVE mg/dL   Hgb urine dipstick MODERATE (A) NEGATIVE   Bilirubin Urine NEGATIVE NEGATIVE   Ketones, ur NEGATIVE NEGATIVE mg/dL   Protein, ur 30 (A) NEGATIVE mg/dL   Nitrite NEGATIVE NEGATIVE   Leukocytes,Ua NEGATIVE NEGATIVE   RBC / HPF 0-5 0 - 5 RBC/hpf   WBC, UA 0-5 0 - 5 WBC/hpf   Bacteria, UA RARE (A) NONE SEEN   Squamous Epithelial / LPF 0-5 0 - 5   Mucus PRESENT     Comment: Performed at [facility], [address] [place], [place] [address]  Rapid urine drug screen (hospital performed)     Status: None   Collection Time: 02/03/22  8:15 AM  Result Value Ref Range   Opiates NONE DETECTED NONE DETECTED   Cocaine NONE DETECTED NONE DETECTED   Benzodiazepines NONE DETECTED NONE DETECTED   Amphetamines NONE DETECTED NONE DETECTED   Tetrahydrocannabinol NONE DETECTED NONE DETECTED   Barbiturates NONE DETECTED NONE DETECTED    Comment: (NOTE) DRUG SCREEN FOR MEDICAL PURPOSES ONLY.  IF CONFIRMATION IS NEEDED FOR ANY PURPOSE, NOTIFY LAB WITHIN 5 DAYS.  LOWEST DETECTABLE LIMITS FOR URINE DRUG  SCREEN Drug Class                     Cutoff (ng/mL) Amphetamine and metabolites    1000 Barbiturate and metabolites    200 Benzodiazepine                 778 Tricyclics and metabolites     300 Opiates and metabolites        300 Cocaine and metabolites        300 THC                            50 Performed at [facility], [place] [address], [place], [place] [address]   Lactic acid, plasma     Status: None   Collection Time: 02/03/22  8:27 AM  Result Value Ref Range   Lactic Acid, Venous 1.3 0.5 - 1.9 mmol/L    Comment: Performed at [facility] [address] [place], [place] [address]  CBG monitoring, ED     Status: Abnormal   Collection Time: 02/03/22 10:04 AM  Result Value Ref Range   Glucose-Capillary 164 (H) 70 - 99 mg/dL    Comment: Glucose reference range applies only to samples taken after fasting for at least 8 hours.    MR Lumbar Spine W Wo Contrast  Result Date: 02/03/2022 CLINICAL DATA:  Concern for  infection. EXAM: MRI LUMBAR SPINE WITHOUT AND WITH CONTRAST TECHNIQUE: Multiplanar and multiecho pulse sequences of the lumbar spine were obtained without and with intravenous contrast. CONTRAST:  28m GADAVIST GADOBUTROL 1 MMOL/ML IV SOLN COMPARISON:  Same-day lumbar spine CT FINDINGS: Segmentation: In keeping with prior numbering convention, the fusion hardware extends from the L3 to the S1 vertebral body level. Alignment:  Physiologic. Vertebrae: Postsurgical changes from L3-S1 posterior spinal fusion with interbody spacer placement and posterior decompression. Redemonstrated anterior displacement of the interbody spacer at the L3-L4 disc space. There is abnormal T2/stir hyperintense and T1 hypointense signal within the L3 and L4 vertebral bodies as well as the L3-L4 disc space. Known perihardware fractures adjacent to the L3 transpedicular screws are better assessed on same day CT lumbar spine. Conus medullaris and cauda equina: Conus extends to the L1 level. There is  likely compression of the cauda equina nerve roots at the L3-L4 level. Paraspinal and other soft tissues: There is an approximate 6.8 x 4.7 cm peripherally enhancing fluid collection in the paraspinal soft tissues adjacent to the spinal fusion hardware at the L3 vertebral body level (series 10, image 10). This fluid collection extends to the level of the lamina and abuts the spinal fusion hardware (series 11, image 20). There is also abnormal contrast enhancement within the posterior epidural space at the L3 vertebral body level (series 11, image 19) and also circumferentially within the epidural space at the level of the L3-L4 disc space (series 11, image 23). Inferiorly this region of abnormal epidural enhancement extends to the level of the L5 vertebral body level (series 11, image 28). There is edema like signal in the bilateral psoas musculature. There is a focal area of hypoenhanacement within the right psoas musculature (series 11, image 20) measuring 1.1 x 0.8 cm, which could represent a small abscess. Disc levels: There is severe spinal canal stenosis extending inferiorly from the L3 vertebral body level to the L4 vertebral body level with compression of the cauda equina nerve roots secondary to the presence of an epidural phlegmon/abscess. Abnormally enhancing soft tissue also extends into the bilateral neural foramina at L3-L4 and L4-L5 resulting in severe neural foraminal stenosis. IMPRESSION: 1. Overall findings are worrisome for intra and extraspinal infection at the level of the spinal fusion hardware. There is a paraspinal soft tissue abscess that extends anteriorly and abuts the fusion hardware at the L3-L4 vertebral body levels. This abscess likely extends into the epidural space at the L3 and L4 vertebral body levels where there is epidural phlegmon resulting in severe spinal canal stenosis. 2. There is abnormal T2 hyperintense signal within the L3 and L4 vertebral bodies, as well as the L3-L4  disc space. Given concerns for infection in the adjacent tissues, as well as osseous irregularity on same day CT lumbar spine, these findings are worrisome for discitis/osteomyelitis. 3. Small 1.1 x 0.8 cm fluid collection in the right psoas musculature is suspicious for a psoas abscess. 4. Known perihardware fracture at the L3 vertebral body is better assessed on same day CT lumbar spine. Findings were discussed with Dr. MFrancia Greaveson 02/03/22 at 1:16 PM via telephone. Electronically Signed   By: HMarin RobertsM.D.   On: 02/03/2022 13:31   CT Head Wo Contrast  Result Date: 02/03/2022 CLINICAL DATA:  66year old female with altered mental status. EXAM: CT HEAD WITHOUT CONTRAST TECHNIQUE: Contiguous axial images were obtained from the base of the skull through the vertex without intravenous contrast. RADIATION DOSE REDUCTION: This exam was  performed according to the departmental dose-optimization program which includes automated exposure control, adjustment of the mA and/or kV according to patient size and/or use of iterative reconstruction technique. COMPARISON:  None Available. FINDINGS: Brain: Cerebral volume is within normal limits for age. No midline shift, ventriculomegaly, mass effect, evidence of mass lesion, intracranial hemorrhage or evidence of cortically based acute infarction. Patchy, mild to moderate mostly periatrial white matter hypodensity. Otherwise normal gray-white matter differentiation throughout the brain. No cortical encephalomalacia identified. Vascular: Calcified atherosclerosis at the skull base. No suspicious intracranial vascular hyperdensity. Skull: Negative. Sinuses/Orbits: Visualized paranasal sinuses and mastoids are clear. Other: No acute orbit or scalp soft tissue finding. IMPRESSION: 1. No acute intracranial abnormality. 2. Mild to moderate for age cerebral white matter changes, most commonly due to chronic small vessel disease. Electronically Signed   By: Genevie Ann M.D.   On:  02/03/2022 08:05   CT Lumbar Spine Wo Contrast  Result Date: 02/03/2022 CLINICAL DATA:  66 year old female with prior spine surgery. Infection suspected. EXAM: CT LUMBAR SPINE WITHOUT CONTRAST TECHNIQUE: Multidetector CT imaging of the lumbar spine was performed without intravenous contrast administration. Multiplanar CT image reconstructions were also generated. RADIATION DOSE REDUCTION: This exam was performed according to the departmental dose-optimization program which includes automated exposure control, adjustment of the mA and/or kV according to patient size and/or use of iterative reconstruction technique. COMPARISON:  CT lumbar myelogram 12/23/2021. FINDINGS: Segmentation: Normal, the same numbering system used on the CT last month. Alignment: Increased lumbar lordosis, increased slight anterolisthesis of L3 on L4 since last month. Stable upper lumbar and thoracolumbar junction height and alignment. Vertebrae: Interval postoperative changes at L3-L4. See additional details below. T11 through L2 appears stable and intact. Visible sacrum and SI joints appear stable and intact. Paraspinal and other soft tissues: Aortoiliac calcified atherosclerosis. Stable visible noncontrast abdominal viscera. New ventral prevertebral soft tissue inflammation at the L4 level (series 3, image 80). Moderate bilateral paraspinal muscle enlargement and edema is new from last month, although interval postoperative changes near that level. No organized fluid collection is evident in the absence of contrast. Disc levels: Visible lower thoracic levels through L1-L2 appears stable since last month. L2-L3: Confluent new paraspinal soft tissue edema or inflammation (series 3, image 57). L2 vertebra and the posterior elements here appears stable. Abnormal L3 vertebra, further detailed below. L3-L4: New bilateral pedicle screws and interbody implant at this level. Transverse fracture of the posterior vertebral body with mild  displacement of the posterosuperior endplate. See series 8, image 46. bilateral L3 pedicle screws appear intact although with bilateral vertebral fracture or lucency along the course of both screws (coronal image 30 and sagittal images 41 and 56. Anteriorly positioned and somewhat anteriorly displaced interbody implant (sagittal image 50). The L3 inferior endplate appears stable from last month. L4 superior endplate appears stable from last month. L4 pedicle screws have been upsized. There is ongoing lucency along the left L4 screw which communicates with the disc space (sagittal image 56). Improved appearance of the right L4 screw since last month. L4-L5: L4 pedicle screws detailed above. Stable interbody implant at this level. Stable endplates. Stable posterior elements with previous posterior decompression. L5-S1: Chronic decompression and fusion. L5 pedicle screws appear stable and satisfactory. S1 pedicle screws have been upsized. Some residual S1 pedicle screw lucency. Stable interbody implant. Stable endplates. IMPRESSION: 1. Interval postoperative changes at L3-L4 since CT myelogram last month. Acute to subacute Fractures of the posterior L3 body and bilateral posterior elements, including fracture and/or  lucency along the course of both L3 pedicle screws. Anteriorly displaced L3-L4 interbody implant. New bilateral paraspinal soft tissue edema and inflammation (including new prevertebral inflammation at L4) which may indicate Acute Spinal Infection, but no new endplate erosion since last month. 2. Bilateral L4 and S1 pedicle screws have been up-sized since last month due to hardware loosening. Continued lucency along the left L4 screw. 3. Otherwise stable CT appearance of L4-L5 and L5-S1 decompression and fusion since last month. 4. Visible lower thoracic levels through L2 appears stable. 5. Aortic Atherosclerosis (ICD10-I70.0). Electronically Signed   By: Genevie Ann M.D.   On: 02/03/2022 08:03   DG Chest  Port 1 View  Result Date: 02/03/2022 CLINICAL DATA:  66 year old female with possible sepsis. EXAM: PORTABLE CHEST 1 VIEW COMPARISON:  Chest radiographs 10/09/2012. FINDINGS: Portable AP semi upright view at 0636 hours. Lower lung volumes. Mediastinal contours remain normal. Visualized tracheal air column is within normal limits. Allowing for portable technique the lungs are clear. No pneumothorax or pleural effusion. Negative visible bowel gas. No acute osseous abnormality identified. IMPRESSION: No acute cardiopulmonary abnormality. Electronically Signed   By: Genevie Ann M.D.   On: 02/03/2022 07:11    Review of Systems  Constitutional:  Positive for chills and fever.  HENT: Negative.    Eyes: Negative.   Respiratory: Negative.    Cardiovascular: Negative.   Gastrointestinal: Negative.   Endocrine: Negative.   Genitourinary: Negative.   Musculoskeletal:  Positive for back pain, gait problem and myalgias.  Skin: Negative.   Allergic/Immunologic: Negative.   Hematological: Negative.   Psychiatric/Behavioral:  Positive for decreased concentration.    Blood pressure (!) 154/79, pulse (!) 102, temperature 98.1 F (36.7 C), temperature source Oral, resp. rate 16, height '4\' 10"'$  (1.473 m), weight 87.1 kg, SpO2 97 %. Physical Exam Constitutional:      General: She is in acute distress.     Appearance: She is ill-appearing.  HENT:     Head: Normocephalic and atraumatic.     Right Ear: External ear normal.     Left Ear: External ear normal.     Mouth/Throat:     Mouth: Mucous membranes are moist.     Pharynx: Oropharynx is clear.  Eyes:     Extraocular Movements: Extraocular movements intact.     Pupils: Pupils are equal, round, and reactive to light.  Pulmonary:     Effort: Pulmonary effort is normal.  Abdominal:     General: Abdomen is flat.  Musculoskeletal:     Cervical back: Normal range of motion and neck supple.  Skin:    General: Skin is warm and dry.  Neurological:      General: No focal deficit present.     Mental Status: She is alert and oriented to person, place, and time.     Cranial Nerves: No cranial nerve deficit.     Motor: Weakness present.     Gait: Gait abnormal.  Psychiatric:        Mood and Affect: Mood normal.        Behavior: Behavior normal.        Thought Content: Thought content normal.        Judgment: Judgment normal.     Assessment/Plan: [name] is a 66 y.o. female With bilateral pedicle fractures, and bilateral anterior cage displacement since her surgery. She is complaining of left groin pain, and lower extremity pain. I have explained to her husband and son that the hardware will have to  be removed. That her lower extremity pain is most likely due to the fractures seen on the ct. That I may have to float this level by placing screws at L2. I do not believe she has an overwhelming infection at the operative level, but if that is the case that the hardware still needs to be removed. Most of the mri findings can be explained by the recent lumbar surgery.  Will follow. Receiving antibiotics at this time.   Ashok Pall 02/03/2022, 8:13 PM

## 2022-02-03 NOTE — ED Notes (Signed)
ED TO INPATIENT HANDOFF REPORT  ED Nurse Name and Phone #: [phone]  S Name/Age/Gender [name] 66 y.o. female Room/Bed: 022C/022C  Code Status   Code Status: Full Code  Home/SNF/Other Home Patient oriented to: self, place, time, and situation Is this baseline? Yes   Triage Complete: Triage complete  Chief Complaint Sepsis [facility]) [A41.9]  Triage Note Per EMS, pt's husband was unable to waken pt this moring.  He noted that she did get up to the use the bathroom at 3am and "felt that something was wrong".  He also noted that pt did take a muscle relaxer at that time.  She is now responding to name however she is not answering any questions.    160/80 HR 120 95% RA RR 24    Allergies Allergies  Allergen Reactions   Crestor [Rosuvastatin] Other (See Comments)    Myalgia    Robaxin [Methocarbamol] Other (See Comments)    Insomnia   Zocor [Simvastatin] Other (See Comments)    Myalgias    Lipitor [Atorvastatin] Other (See Comments)    Myalgia   Sulfa Antibiotics Hives    Level of Care/Admitting Diagnosis ED Disposition     ED Disposition  Admit   Condition  --   [place]: [facility] [[id number]]  Level of Care: Progressive [102]  Admit to Progressive based on following criteria: MULTISYSTEM THREATS such as stable sepsis, metabolic/electrolyte imbalance with or without encephalopathy that is responding to early treatment.  May admit patient to [facility] or [facility] if equivalent level of care is available:: No  Covid Evaluation: Asymptomatic - no recent exposure (last 10 days) testing not required  Diagnosis: Sepsis [facility]) [[id number]]  Admitting Physician: Charise Killian [[id number]]  Attending Physician: Charise Killian [[id number]]  Certification:: I certify this patient will need inpatient services for at least 2 midnights  Estimated Length of Stay: 3          B Medical/Surgery History Past Medical History:  Diagnosis Date    Allergy    Anemia    Anxiety    Aortic stenosis    moderate AS by echo 12/2021   Arthritis    Asthma    BMI 40.0-44.9, adult ([place]) 04/07/2014   DM type 2 (diabetes mellitus, type 2) (HCC)    Dyspnea    GERD (gastroesophageal reflux disease)    Heart murmur    HTN (hypertension)    Hypercholesterolemia    Osteoarthritis of left hip 04/07/2014   Reflux    Spinal headache    with C-Section and with spinal fusion in 2019   Past Surgical History:  Procedure Laterality Date   CESAREAN SECTION     x3   COLONOSCOPY     LAMINECTOMY  03/2021   SPINAL FUSION  2019   TUBAL LIGATION       A IV Location/Drains/Wounds Patient Lines/Drains/Airways Status     Active Line/Drains/Airways     Name Placement date Placement time Site Days   Peripheral IV 02/03/22 20 G Left Antecubital 02/03/22  --  Antecubital  less than 1   Peripheral IV 02/03/22 18 G Right Antecubital 02/03/22  0720  Antecubital  less than 1   Incision (Closed) 02/01/18 Back Other (Comment) 02/01/18  1430  -- 1463   Incision (Closed) 01/13/22 Back 01/13/22  1455  -- 21            Intake/Output Last 24 hours  Intake/Output Summary (Last 24 hours) at 02/03/2022 1640  Last data filed at 02/03/2022 1343 Gross per 24 hour  Intake 3550 ml  Output 500 ml  Net 3050 ml    Labs/Imaging Results for orders placed or performed during the hospital encounter of 02/03/22 (from the past 48 hour(s))  SARS Coronavirus 2 by RT PCR (hospital order, performed in [facility] hospital lab) *cepheid single result test* Anterior Nasal Swab     Status: None   Collection Time: 02/03/22  6:26 AM   Specimen: Anterior Nasal Swab  Result Value Ref Range   SARS Coronavirus 2 by RT PCR NEGATIVE NEGATIVE    Comment: (NOTE) SARS-CoV-2 target nucleic acids are NOT DETECTED.  The SARS-CoV-2 RNA is generally detectable in upper and lower respiratory specimens during the acute phase of infection. The lowest concentration of SARS-CoV-2 viral  copies this assay can detect is 250 copies / mL. A negative result does not preclude SARS-CoV-2 infection and should not be used as the sole basis for treatment or other patient management decisions.  A negative result may occur with improper specimen collection / handling, submission of specimen other than nasopharyngeal swab, presence of viral mutation(s) within the areas targeted by this assay, and inadequate number of viral copies (<250 copies / mL). A negative result must be combined with clinical observations, patient history, and epidemiological information.  Fact Sheet for Patients:   https://www.patel.info/  Fact Sheet for Healthcare Providers: https://hall.com/  This test is not yet approved or  cleared by the [place] FDA and has been authorized for detection and/or diagnosis of SARS-CoV-2 by FDA under an Emergency Use Authorization (EUA).  This EUA will remain in effect (meaning this test can be used) for the duration of the COVID-19 declaration under Section 564(b)(1) of the Act, 21 U.S.C. section 360bbb-3(b)(1), unless the authorization is terminated or revoked sooner.  Performed at [facility], [place] [address], [place], [place] [address]   Comprehensive metabolic panel     Status: Abnormal   Collection Time: 02/03/22  6:27 AM  Result Value Ref Range   Sodium 139 135 - 145 mmol/L   Potassium 3.7 3.5 - 5.1 mmol/L   Chloride 101 98 - 111 mmol/L   CO2 24 22 - 32 mmol/L   Glucose, Bld 151 (H) 70 - 99 mg/dL    Comment: Glucose reference range applies only to samples taken after fasting for at least 8 hours.   BUN 26 (H) 8 - 23 mg/dL   Creatinine, Ser 1.11 (H) 0.44 - 1.00 mg/dL   Calcium 9.9 8.9 - 10.3 mg/dL   Total Protein 7.8 6.5 - 8.1 g/dL   Albumin 3.6 3.5 - 5.0 g/dL   AST 16 15 - 41 U/L   ALT 12 0 - 44 U/L   Alkaline Phosphatase 71 38 - 126 U/L   Total Bilirubin 0.6 0.3 - 1.2 mg/dL   GFR, Estimated 56 (L)  >60 mL/min    Comment: (NOTE) Calculated using the CKD-EPI Creatinine Equation (2021)    Anion gap 14 5 - 15    Comment: Performed at [facility] [address] [place] [address]  CBC     Status: Abnormal   Collection Time: 02/03/22  6:27 AM  Result Value Ref Range   WBC 10.0 4.0 - 10.5 K/uL   RBC 3.97 3.87 - 5.11 MIL/uL   Hemoglobin 9.9 (L) 12.0 - 15.0 g/dL   HCT 31.1 (L) 36.0 - 46.0 %   MCV 78.3 (L) 80.0 - 100.0 fL  MCH 24.9 (L) 26.0 - 34.0 pg   MCHC 31.8 30.0 - 36.0 g/dL   RDW 15.9 (H) 11.5 - 15.5 %   Platelets 422 (H) 150 - 400 K/uL   nRBC 0.0 0.0 - 0.2 %    Comment: Performed at [facility] [address], [place], [place] [address]  Lactic acid, plasma     Status: None   Collection Time: 02/03/22  6:27 AM  Result Value Ref Range   Lactic Acid, Venous 1.3 0.5 - 1.9 mmol/L    Comment: Performed at [facility] [address] [place], [place] [address]  Protime-INR     Status: None   Collection Time: 02/03/22  6:27 AM  Result Value Ref Range   Prothrombin Time 14.2 11.4 - 15.2 seconds   INR 1.1 0.8 - 1.2    Comment: (NOTE) INR goal varies based on device and disease states. Performed at [facility], [place] [address], [place], [place] [address]   APTT     Status: None   Collection Time: 02/03/22  6:27 AM  Result Value Ref Range   aPTT 35 24 - 36 seconds    Comment: Performed at [facility] [address] [place], [place] [address]  Blood Culture (routine x 2)     Status: None (Preliminary result)   Collection Time: 02/03/22  6:27 AM   Specimen: BLOOD  Result Value Ref Range   Specimen Description BLOOD LEFT ANTECUBITAL    Special Requests      BOTTLES DRAWN AEROBIC AND ANAEROBIC Blood Culture results may not be optimal due to an excessive volume of blood received in culture bottles   Culture      NO GROWTH <12 HOURS Performed at [facility] [address] [place], [place] [address]    Report Status PENDING    Urinalysis, Routine w reflex microscopic In/Out Cath Urine     Status: Abnormal   Collection Time: 02/03/22  8:15 AM  Result Value Ref Range   Color, Urine YELLOW YELLOW   APPearance CLEAR CLEAR   Specific Gravity, Urine 1.013 1.005 - 1.030   pH 5.0 5.0 - 8.0   Glucose, UA NEGATIVE NEGATIVE mg/dL   Hgb urine dipstick MODERATE (A) NEGATIVE   Bilirubin Urine NEGATIVE NEGATIVE   Ketones, ur NEGATIVE NEGATIVE mg/dL   Protein, ur 30 (A) NEGATIVE mg/dL   Nitrite NEGATIVE NEGATIVE   Leukocytes,Ua NEGATIVE NEGATIVE   RBC / HPF 0-5 0 - 5 RBC/hpf   WBC, UA 0-5 0 - 5 WBC/hpf   Bacteria, UA RARE (A) NONE SEEN   Squamous Epithelial / LPF 0-5 0 - 5   Mucus PRESENT     Comment: Performed at [facility], [address] [place], [place] [address]  Rapid urine drug screen (hospital performed)     Status: None   Collection Time: 02/03/22  8:15 AM  Result Value Ref Range   Opiates NONE DETECTED NONE DETECTED   Cocaine NONE DETECTED NONE DETECTED   Benzodiazepines NONE DETECTED NONE DETECTED   Amphetamines NONE DETECTED NONE DETECTED   Tetrahydrocannabinol NONE DETECTED NONE DETECTED   Barbiturates NONE DETECTED NONE DETECTED    Comment: (NOTE) DRUG SCREEN FOR MEDICAL PURPOSES ONLY.  IF CONFIRMATION IS NEEDED FOR ANY PURPOSE, NOTIFY LAB WITHIN 5 DAYS.  LOWEST DETECTABLE LIMITS FOR URINE DRUG SCREEN Drug Class  Cutoff (ng/mL) Amphetamine and metabolites    1000 Barbiturate and metabolites    200 Benzodiazepine                 657 Tricyclics and metabolites     300 Opiates and metabolites        300 Cocaine and metabolites        300 THC                            50 Performed at [facility], [place] [address], [place], [place] [address]   Lactic acid, plasma     Status: None   Collection Time: 02/03/22  8:27 AM  Result Value Ref Range   Lactic Acid, Venous 1.3 0.5 - 1.9 mmol/L    Comment: Performed at [facility] [address] [place],  [place] [address]  CBG monitoring, ED     Status: Abnormal   Collection Time: 02/03/22 10:04 AM  Result Value Ref Range   Glucose-Capillary 164 (H) 70 - 99 mg/dL    Comment: Glucose reference range applies only to samples taken after fasting for at least 8 hours.   MR Lumbar Spine W Wo Contrast  Result Date: 02/03/2022 CLINICAL DATA:  Concern for infection. EXAM: MRI LUMBAR SPINE WITHOUT AND WITH CONTRAST TECHNIQUE: Multiplanar and multiecho pulse sequences of the lumbar spine were obtained without and with intravenous contrast. CONTRAST:  58m GADAVIST GADOBUTROL 1 MMOL/ML IV SOLN COMPARISON:  Same-day lumbar spine CT FINDINGS: Segmentation: In keeping with prior numbering convention, the fusion hardware extends from the L3 to the S1 vertebral body level. Alignment:  Physiologic. Vertebrae: Postsurgical changes from L3-S1 posterior spinal fusion with interbody spacer placement and posterior decompression. Redemonstrated anterior displacement of the interbody spacer at the L3-L4 disc space. There is abnormal T2/stir hyperintense and T1 hypointense signal within the L3 and L4 vertebral bodies as well as the L3-L4 disc space. Known perihardware fractures adjacent to the L3 transpedicular screws are better assessed on same day CT lumbar spine. Conus medullaris and cauda equina: Conus extends to the L1 level. There is likely compression of the cauda equina nerve roots at the L3-L4 level. Paraspinal and other soft tissues: There is an approximate 6.8 x 4.7 cm peripherally enhancing fluid collection in the paraspinal soft tissues adjacent to the spinal fusion hardware at the L3 vertebral body level (series 10, image 10). This fluid collection extends to the level of the lamina and abuts the spinal fusion hardware (series 11, image 20). There is also abnormal contrast enhancement within the posterior epidural space at the L3 vertebral body level (series 11, image 19) and also circumferentially within the epidural space at  the level of the L3-L4 disc space (series 11, image 23). Inferiorly this region of abnormal epidural enhancement extends to the level of the L5 vertebral body level (series 11, image 28). There is edema like signal in the bilateral psoas musculature. There is a focal area of hypoenhanacement within the right psoas musculature (series 11, image 20) measuring 1.1 x 0.8 cm, which could represent a small abscess. Disc levels: There is severe spinal canal stenosis extending inferiorly from the L3 vertebral body level to the L4 vertebral body level with compression of the cauda equina nerve roots secondary to the presence of an epidural phlegmon/abscess. Abnormally enhancing soft tissue also extends into the bilateral neural foramina at L3-L4 and L4-L5 resulting in severe neural foraminal stenosis. IMPRESSION:  1. Overall findings are worrisome for intra and extraspinal infection at the level of the spinal fusion hardware. There is a paraspinal soft tissue abscess that extends anteriorly and abuts the fusion hardware at the L3-L4 vertebral body levels. This abscess likely extends into the epidural space at the L3 and L4 vertebral body levels where there is epidural phlegmon resulting in severe spinal canal stenosis. 2. There is abnormal T2 hyperintense signal within the L3 and L4 vertebral bodies, as well as the L3-L4 disc space. Given concerns for infection in the adjacent tissues, as well as osseous irregularity on same day CT lumbar spine, these findings are worrisome for discitis/osteomyelitis. 3. Small 1.1 x 0.8 cm fluid collection in the right psoas musculature is suspicious for a psoas abscess. 4. Known perihardware fracture at the L3 vertebral body is better assessed on same day CT lumbar spine. Findings were discussed with Dr. Francia Greaves on 02/03/22 at 1:16 PM via telephone. Electronically Signed   By: Marin Roberts M.D.   On: 02/03/2022 13:31   CT Head Wo Contrast  Result Date: 02/03/2022 CLINICAL DATA:   66 year old female with altered mental status. EXAM: CT HEAD WITHOUT CONTRAST TECHNIQUE: Contiguous axial images were obtained from the base of the skull through the vertex without intravenous contrast. RADIATION DOSE REDUCTION: This exam was performed according to the departmental dose-optimization program which includes automated exposure control, adjustment of the mA and/or kV according to patient size and/or use of iterative reconstruction technique. COMPARISON:  None Available. FINDINGS: Brain: Cerebral volume is within normal limits for age. No midline shift, ventriculomegaly, mass effect, evidence of mass lesion, intracranial hemorrhage or evidence of cortically based acute infarction. Patchy, mild to moderate mostly periatrial white matter hypodensity. Otherwise normal gray-white matter differentiation throughout the brain. No cortical encephalomalacia identified. Vascular: Calcified atherosclerosis at the skull base. No suspicious intracranial vascular hyperdensity. Skull: Negative. Sinuses/Orbits: Visualized paranasal sinuses and mastoids are clear. Other: No acute orbit or scalp soft tissue finding. IMPRESSION: 1. No acute intracranial abnormality. 2. Mild to moderate for age cerebral white matter changes, most commonly due to chronic small vessel disease. Electronically Signed   By: Genevie Ann M.D.   On: 02/03/2022 08:05   CT Lumbar Spine Wo Contrast  Result Date: 02/03/2022 CLINICAL DATA:  66 year old female with prior spine surgery. Infection suspected. EXAM: CT LUMBAR SPINE WITHOUT CONTRAST TECHNIQUE: Multidetector CT imaging of the lumbar spine was performed without intravenous contrast administration. Multiplanar CT image reconstructions were also generated. RADIATION DOSE REDUCTION: This exam was performed according to the departmental dose-optimization program which includes automated exposure control, adjustment of the mA and/or kV according to patient size and/or use of iterative reconstruction  technique. COMPARISON:  CT lumbar myelogram 12/23/2021. FINDINGS: Segmentation: Normal, the same numbering system used on the CT last month. Alignment: Increased lumbar lordosis, increased slight anterolisthesis of L3 on L4 since last month. Stable upper lumbar and thoracolumbar junction height and alignment. Vertebrae: Interval postoperative changes at L3-L4. See additional details below. T11 through L2 appears stable and intact. Visible sacrum and SI joints appear stable and intact. Paraspinal and other soft tissues: Aortoiliac calcified atherosclerosis. Stable visible noncontrast abdominal viscera. New ventral prevertebral soft tissue inflammation at the L4 level (series 3, image 80). Moderate bilateral paraspinal muscle enlargement and edema is new from last month, although interval postoperative changes near that level. No organized fluid collection is evident in the absence of contrast. Disc levels: Visible lower thoracic levels through L1-L2 appears stable since last month. L2-L3: Confluent  new paraspinal soft tissue edema or inflammation (series 3, image 57). L2 vertebra and the posterior elements here appears stable. Abnormal L3 vertebra, further detailed below. L3-L4: New bilateral pedicle screws and interbody implant at this level. Transverse fracture of the posterior vertebral body with mild displacement of the posterosuperior endplate. See series 8, image 46. bilateral L3 pedicle screws appear intact although with bilateral vertebral fracture or lucency along the course of both screws (coronal image 30 and sagittal images 41 and 56. Anteriorly positioned and somewhat anteriorly displaced interbody implant (sagittal image 50). The L3 inferior endplate appears stable from last month. L4 superior endplate appears stable from last month. L4 pedicle screws have been upsized. There is ongoing lucency along the left L4 screw which communicates with the disc space (sagittal image 56). Improved appearance of the  right L4 screw since last month. L4-L5: L4 pedicle screws detailed above. Stable interbody implant at this level. Stable endplates. Stable posterior elements with previous posterior decompression. L5-S1: Chronic decompression and fusion. L5 pedicle screws appear stable and satisfactory. S1 pedicle screws have been upsized. Some residual S1 pedicle screw lucency. Stable interbody implant. Stable endplates. IMPRESSION: 1. Interval postoperative changes at L3-L4 since CT myelogram last month. Acute to subacute Fractures of the posterior L3 body and bilateral posterior elements, including fracture and/or lucency along the course of both L3 pedicle screws. Anteriorly displaced L3-L4 interbody implant. New bilateral paraspinal soft tissue edema and inflammation (including new prevertebral inflammation at L4) which may indicate Acute Spinal Infection, but no new endplate erosion since last month. 2. Bilateral L4 and S1 pedicle screws have been up-sized since last month due to hardware loosening. Continued lucency along the left L4 screw. 3. Otherwise stable CT appearance of L4-L5 and L5-S1 decompression and fusion since last month. 4. Visible lower thoracic levels through L2 appears stable. 5. Aortic Atherosclerosis (ICD10-I70.0). Electronically Signed   By: Genevie Ann M.D.   On: 02/03/2022 08:03   DG Chest Port 1 View  Result Date: 02/03/2022 CLINICAL DATA:  66 year old female with possible sepsis. EXAM: PORTABLE CHEST 1 VIEW COMPARISON:  Chest radiographs 10/09/2012. FINDINGS: Portable AP semi upright view at 0636 hours. Lower lung volumes. Mediastinal contours remain normal. Visualized tracheal air column is within normal limits. Allowing for portable technique the lungs are clear. No pneumothorax or pleural effusion. Negative visible bowel gas. No acute osseous abnormality identified. IMPRESSION: No acute cardiopulmonary abnormality. Electronically Signed   By: Genevie Ann M.D.   On: 02/03/2022 07:11    Pending  Labs Unresulted Labs (From admission, onward)     Start     Ordered   02/04/22 5188  Basic metabolic panel  Tomorrow morning,   R        02/03/22 1000   02/04/22 0500  CBC  Tomorrow morning,   R        02/03/22 1000   02/04/22 0500  HIV Antibody (routine testing w rflx)  (HIV Antibody (Routine testing w reflex) panel)  Tomorrow morning,   R        02/03/22 1004   02/04/22 0500  Iron and TIBC  Tomorrow morning,   R        02/03/22 1029   02/04/22 0500  Ferritin  Tomorrow morning,   R        02/03/22 1029   02/03/22 0657  Resp Panel by RT-PCR (Flu A&B, Covid) Anterior Nasal Swab  (Septic presentation on arrival (screening labs, nursing and treatment orders for obvious sepsis))  Once,   URGENT        02/03/22 0657   02/03/22 0625  Blood Culture (routine x 2)  (Undifferentiated presentation (screening labs and basic nursing orders))  BLOOD CULTURE X 2,   STAT      02/03/22 0625   02/03/22 0625  Urine Culture  (Undifferentiated presentation (screening labs and basic nursing orders))  ONCE - URGENT,   URGENT       Question:  Indication  Answer:  Sepsis   02/03/22 0625            Vitals/Pain Today's Vitals   02/03/22 1429 02/03/22 1500 02/03/22 1515 02/03/22 1527  BP: (!) 151/69 (!) 140/78 (!) 154/79   Pulse: 95 99 (!) 102   Resp: '16 16 16   '$ Temp:      TempSrc:      SpO2: 100% 96% 97%   Weight:      Height:      PainSc:    6     Isolation Precautions No active isolations  Medications Medications  lactated ringers infusion ( Intravenous New Bag/Given 02/03/22 0907)  lactated ringers bolus 1,000 mL (0 mLs Intravenous Stopped 02/03/22 0846)    And  lactated ringers bolus 1,000 mL (0 mLs Intravenous Hold 02/03/22 0723)  ceFEPIme (MAXIPIME) 2 g in sodium chloride 0.9 % 100 mL IVPB (has no administration in time range)  vancomycin (VANCOREADY) IVPB 750 mg/150 mL (has no administration in time range)  HYDROmorphone (DILAUDID) injection 0.5 mg (0.5 mg Intravenous Given 02/03/22  1434)  methocarbamol (ROBAXIN) 500 mg in dextrose 5 % 50 mL IVPB (0 mg Intravenous Stopped 02/03/22 1343)  albuterol (PROVENTIL) (2.5 MG/3ML) 0.083% nebulizer solution 3 mL (has no administration in time range)  acetaminophen (TYLENOL) tablet 1,000 mg (1,000 mg Oral Given 02/03/22 0643)  ceFEPIme (MAXIPIME) 2 g in sodium chloride 0.9 % 100 mL IVPB (0 g Intravenous Stopped 02/03/22 0800)  metroNIDAZOLE (FLAGYL) IVPB 500 mg (0 mg Intravenous Stopped 02/03/22 0903)  vancomycin (VANCOREADY) IVPB 1500 mg/300 mL (0 mg Intravenous Stopped 02/03/22 1131)  morphine (PF) 2 MG/ML injection 2 mg (2 mg Intravenous Given 02/03/22 0840)  ondansetron (ZOFRAN) injection 4 mg (4 mg Intravenous Given 02/03/22 0840)  gadobutrol (GADAVIST) 1 MMOL/ML injection 10 mL (10 mLs Intravenous Contrast Given 02/03/22 1237)    Mobility walks with person assist Low fall risk   Focused Assessments     R Recommendations: See Admitting Provider Note  Report given to:   Additional Notes:

## 2022-02-04 ENCOUNTER — Other Ambulatory Visit: Payer: Self-pay | Admitting: Neurosurgery

## 2022-02-04 DIAGNOSIS — G9340 Encephalopathy, unspecified: Secondary | ICD-10-CM | POA: Diagnosis not present

## 2022-02-04 DIAGNOSIS — A419 Sepsis, unspecified organism: Secondary | ICD-10-CM

## 2022-02-04 LAB — CBC
HCT: 29.3 % — ABNORMAL LOW (ref 36.0–46.0)
Hemoglobin: 9.5 g/dL — ABNORMAL LOW (ref 12.0–15.0)
MCH: 25 pg — ABNORMAL LOW (ref 26.0–34.0)
MCHC: 32.4 g/dL (ref 30.0–36.0)
MCV: 77.1 fL — ABNORMAL LOW (ref 80.0–100.0)
Platelets: 351 10*3/uL (ref 150–400)
RBC: 3.8 MIL/uL — ABNORMAL LOW (ref 3.87–5.11)
RDW: 16 % — ABNORMAL HIGH (ref 11.5–15.5)
WBC: 8.2 10*3/uL (ref 4.0–10.5)
nRBC: 0 % (ref 0.0–0.2)

## 2022-02-04 LAB — BASIC METABOLIC PANEL
Anion gap: 18 — ABNORMAL HIGH (ref 5–15)
BUN: 11 mg/dL (ref 8–23)
CO2: 23 mmol/L (ref 22–32)
Calcium: 9.5 mg/dL (ref 8.9–10.3)
Chloride: 102 mmol/L (ref 98–111)
Creatinine, Ser: 0.92 mg/dL (ref 0.44–1.00)
GFR, Estimated: >60 mL/min (ref >60)
Glucose, Bld: 136 mg/dL — ABNORMAL HIGH (ref 70–99)
Potassium: 3.6 mmol/L (ref 3.5–5.1)
Sodium: 143 mmol/L (ref 135–145)

## 2022-02-04 LAB — GLUCOSE, CAPILLARY
Glucose-Capillary: 134 mg/dL — ABNORMAL HIGH (ref 70–99)
Glucose-Capillary: 124 mg/dL — ABNORMAL HIGH (ref 70–99)
Glucose-Capillary: 110 mg/dL — ABNORMAL HIGH (ref 70–99)
Glucose-Capillary: 118 mg/dL — ABNORMAL HIGH (ref 70–99)

## 2022-02-04 LAB — BLOOD CULTURE ID PANEL (REFLEXED) - BCID2

## 2022-02-04 LAB — IRON AND TIBC
Iron: 17 ug/dL — ABNORMAL LOW (ref 28–170)
Saturation Ratios: 7 % — ABNORMAL LOW (ref 10.4–31.8)
TIBC: 241 ug/dL — ABNORMAL LOW (ref 250–450)
UIBC: 224 ug/dL

## 2022-02-04 LAB — URINE CULTURE: Culture: NO GROWTH

## 2022-02-04 LAB — FERRITIN: Ferritin: 212 ng/mL (ref 11–307)

## 2022-02-04 LAB — RESP PANEL BY RT-PCR (FLU A&B, COVID) ARPGX2
Influenza A by PCR: NEGATIVE
Influenza B by PCR: NEGATIVE
SARS Coronavirus 2 by RT PCR: NEGATIVE

## 2022-02-04 LAB — HIV ANTIBODY (ROUTINE TESTING W REFLEX): HIV Screen 4th Generation wRfx: NONREACTIVE

## 2022-02-04 MED ORDER — AMLODIPINE BESYLATE 10 MG PO TABS
10.0000 mg | ORAL_TABLET | Freq: Every day | ORAL | Status: DC
  Administered 2022-02-04 – 2022-02-12 (×9): 10 mg via ORAL
  Filled 2022-02-04 (×9): qty 1

## 2022-02-04 MED ORDER — HYDROMORPHONE HCL 1 MG/ML IJ SOLN
1.0000 mg | INTRAMUSCULAR | Status: DC | PRN
  Administered 2022-02-04 – 2022-02-06 (×7): 1 mg via INTRAVENOUS
  Filled 2022-02-04 (×8): qty 1

## 2022-02-04 MED ORDER — HYDROMORPHONE HCL 1 MG/ML IJ SOLN: 1.0000 mg | INTRAMUSCULAR | Status: DC | PRN

## 2022-02-04 MED ORDER — METHOCARBAMOL 1000 MG/10ML IJ SOLN
500.0000 mg | Freq: Three times a day (TID) | INTRAVENOUS | Status: DC
  Administered 2022-02-04 – 2022-02-05 (×3): 500 mg via INTRAVENOUS
  Filled 2022-02-04 (×3): qty 5
  Filled 2022-02-04: qty 500
  Filled 2022-02-04 (×2): qty 5

## 2022-02-04 MED ORDER — KETOROLAC TROMETHAMINE 30 MG/ML IJ SOLN: 30.0000 mg | Freq: Four times a day (QID) | INTRAMUSCULAR | Status: DC

## 2022-02-04 NOTE — Progress Notes (Signed)
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  Admitted with back pain.   MR Lumbar Spine W/WO 1. Overall findings are worrisome for intra and extraspinal infection at the level of the spinal fusion hardware. There is a paraspinal soft tissue abscess that extends anteriorly and abuts the fusion hardware at the L3-L4 vertebral body levels. This abscess likely extends into the epidural space at the L3 and L4 vertebral body levels where there is epidural phlegmon resulting in severe spinal canal stenosis.   2. There is abnormal T2 hyperintense signal within the L3 and L4 vertebral bodies, as well as the L3-L4 disc space. Given concerns for infection in the adjacent tissues, as well as osseous irregularity on same day CT lumbar spine, these findings are worrisome for discitis/osteomyelitis.   3. Small 1.1 x 0.8 cm fluid collection in the right psoas musculature is suspicious for a psoas abscess.   4. Known perihardware fracture at the L3 vertebral body is better assessed on same day CT lumbar spine.  IR consulted for psoas abscess drainage.  Case reivewed by Dr. Laurence Ferrari who has discussed with ordering service.  Abscess is currently small and likely inaccessible for drainage.  Order canceled.  No procedure planned in IR at this time.   Brynda Greathouse, MS RD PA-C 12:49 PM

## 2022-02-04 NOTE — Progress Notes (Addendum)
NAME:  [name], MRN:  [id number], DOB:  Dec 14, 1955, LOS: 1 ADMISSION DATE:  02/03/2022  Subjective  [name]  Patient evaluated at bedside this AM.  Patient reports muscle pain in the right groin area. She thinks it feels like a muscle spasm. It has been happening before she came here and has been getting pain medicine and it has been helping and she needs it very often. The back pain is not bothering her at this time but does note that last week she has felt more weight on her back and it is still the same at this time. Denies dysuria and increased urinary frequency. No urinary incontinence. She does have good sensation in her groin. AxOx3  Per husband she is about the same from home and now. Yesterday at home she was spaced out per her husband and that is why he called the paramedics. She was shaking and cold. He thinks that she is not as spaced out as she was yesterday. Right leg weak since last surgery. Husband thinks it is because of pain that her right leg is not able to lift. Patient has to use walker at home.   Objective   Blood pressure (!) 149/59, pulse (!) 104, temperature 98.2 F (36.8 C), temperature source Oral, resp. rate 19, height '4\' 10"'$  (1.473 m), weight 87.1 kg, SpO2 95 %.     Intake/Output Summary (Last 24 hours) at 02/04/2022 0543 Last data filed at 02/04/2022 0306 Gross per 24 hour  Intake 4650 ml  Output 500 ml  Net 4150 ml   Filed Weights   02/03/22 0620  Weight: 87.1 kg   Physical Exam: General: Uncomfortable in bed. No acute distress. CV: Tachycardia, grade 4/6 crescendo decrescendo murmur heard at the upper sternal border Pulmonary: Lungs CTAB. Normal effort. No wheezing or rales. Abdominal: Soft, nontender, nondistended. Normal bowel sounds. Extremities: Radial and DP pulses 2+ and symmetric. Weakness of right and left leg hip flexion, worse on the right. 5/5 strength of leg extension, foot extension/flexion bilaterally.  Skin: Warm and dry. Midline  surgical scar appears clean and noninfected, no active discharge Neuro: A&Ox4, knows her name, the date, location, and situation Less sensation on the right leg compared to the left. No focal deficit. Psych: Normal mood and affect   Labs       Latest Ref Rng & Units 02/04/2022    1:01 AM 02/03/2022    6:27 AM 01/11/2022    2:00 PM  CBC  WBC 4.0 - 10.5 K/uL 8.2  10.0  7.9   Hemoglobin 12.0 - 15.0 g/dL 9.5  9.9  11.8   Hematocrit 36.0 - 46.0 % 29.3  31.1  38.2   Platelets 150 - 400 K/uL 351  422  356       Latest Ref Rng & Units 02/04/2022    1:01 AM 02/03/2022    6:27 AM 01/11/2022    2:00 PM  BMP  Glucose 70 - 99 mg/dL 136  151  145   BUN 8 - 23 mg/dL '11  26  20   '$ Creatinine 0.44 - 1.00 mg/dL 0.92  1.11  1.26   Sodium 135 - 145 mmol/L 143  139  138   Potassium 3.5 - 5.1 mmol/L 3.6  3.7  3.9   Chloride 98 - 111 mmol/L 102  101  100   CO2 22 - 32 mmol/L '23  24  23   '$ Calcium 8.9 - 10.3 mg/dL 9.5  9.9  10.0  CT lumbar showed acute/subacute fracture of the posterior L3 body and pedicle screws.  MRI imaging 1. Overall findings are worrisome for intra and extraspinal infection at the level of the spinal fusion hardware. There is a paraspinal soft tissue abscess that extends anteriorly and abuts the fusion hardware at the L3-L4 vertebral body levels. This abscess likely extends into the epidural space at the L3 and L4 vertebral body levels where there is epidural phlegmon resulting in severe spinal canal stenosis.   2. There is abnormal T2 hyperintense signal within the L3 and L4 vertebral bodies, as well as the L3-L4 disc space. Given concerns for infection in the adjacent tissues, as well as osseous irregularity on same day CT lumbar spine, these findings are worrisome for discitis/osteomyelitis.   3. Small 1.1 x 0.8 cm fluid collection in the right psoas musculature is suspicious for a psoas abscess.   4. Known perihardware fracture at the L3 vertebral body is  better assessed on same day CT lumbar spine.  Blood culture 1/4 bottles: Proteus   Summary  [name] is a 66 year old female with past medical history of hypertension, type 2 diabetes, asthma, hyperlipidemia, IDA, aortic stenosis, lumbar spondylolisthesis status post lumbar fusion and screw replacement who was brought to the ED due to altered mental status, admitted for sepsis secondary to Proteus bacteremia and possible spinal infection.  Assessment & Plan:  Principal Problem:   Sepsis ([facility]) Active Problems:   DM type 2 (diabetes mellitus, type 2) ([place])   HTN (hypertension)   S/P lumbar spinal fusion   Lumbar adjacent segment disease with spondylolisthesis   Iron deficiency anemia  Sepsis: 3/4 SIRS criteria with encephalopathy C/f spinal infection, epidural abscess, discitis/osteomyelitis Lumbar spondylolisthesis status post lumbar spinal fusion Patient is admitted for sepsis secondary to possible spinal infection at the site of recent L3-L4 lumbar fusion with screw placement of L3, L4. WBC remains WNL.  Blood culture shows proteus and she was on cefepime and vancomycin. Second blood culture shows no growth <24 hours although notably obtained while on antibiotics. Consult with ID and they said cefepime treatment is sufficient. Lumbar MRI shows paraspinal soft tissue abscess that touches the fusion hardware at the L3-L4 vertebral body levels which extends into the epidural space resulting in severe spinal canal stenosis. There is also concern for discitis/osteomyelitis and psoas abscess. Neurosurgery notes that the hardware will have to be removed scheduled for tomorrow morning. -Neurosurgery following, appreciate recs -Consult with ID, appreciate recs -D/C vancomycin -Carb modified diet, N.p.o. at midnight for surgery tomorrow -Pain control with Dilaudid and Robaxin IV as needed -Morning BMP  Psoas fluid collection, c/f abscess Patient reporting pain on her right anterior thigh  and right lower abdominal region. MRI showed small 1.1 x 0.8 cm fluid collection in the right psoas, musculature is suspicious for a psoas abscess. IR consulted and notes that it is too small for drainage so treatment will be continuing abx at this time and if abscess grows >3 cm, reassess for drainage. -Consult with ID, appreciate recs -Continue cefepime 2 g IV at 200 mL/hr Q12H  Iron deficiency anemia Per notes, patient has IDA from inadequate intake. Patient is taking iron supplement at home. Iron panel shows iron 17, TIBC 241, and ferritin 212 which indicates an element of an anemia of chronic disease superimposed with her chronic iron deficiency anemia. Difficult to interpret in the setting of acute infection. Hgb trend: 9.9>9.5. -Continue to monitor closely -Trend CBCs   Hypertension SBP ranging from 110s-170s,  most recently in the [facility]. Holding home hypertension medication in the setting of sepsis and low normal blood pressure. -Holding amlodipine and hyzaar   Type 2 diabetes Diabetes well controlled with A1c of 6.2 3 weeks ago.  She only takes metformin at home. -Holding metformin while n.p.o. -CBG daily    Best practice:  Full code Diet: N.p.o. IVF: LR DVT: SCD pending neurosurgery evaluation  Stormy Fabian, MD Internal Medicine Resident PGY-1 PAGER: [phone] 02/04/2022 5:43 AM  If after hours (below), please contact on-call pager: [phone] 5PM-7AM Monday-Friday 1PM-7AM Saturday-Sunday

## 2022-02-04 NOTE — Consult Note (Signed)
[facility]ious Disease    Date of Admission:  02/03/2022     Total days of antibiotics 1  Vancomycin  Cefepime  Metronidazole               Reason for Consult: Proteus bacteremia, recent spine surgery     Referring Provider: Saverio Danker Primary Care Provider: Chesley Noon, MD   Assessment: [name] is a 66 y.o. female admitted for management of fever, AMS and increasing back pain since her fusion/hardware revision 01/13/2022. Found to have proteus species bacteremia 2/2 sites and imaging concerning for not only acute fracture of L3 and hardware displacement but possible infection.   Surgical incision is clean/dry and closed; however given otherwise unexplained proteus bacteremia will have to presume that hardware is infected given MRI findings and overall picture. Either way, will need surgical attention given b/l anterior cage displacement and pedicle fractures and concern for infection. Dr. Christella Noa planning to take her 9/14 for hardware revision.   Rt groin pain - acute on chronic weakness but pain is increased with small fluid collection at psoas muscle. IR planning to aspirate/drain. Please send for routine cultures if anything can be aspirated.   D/C Vancomycin and metronidazole. Continue cefepime for now until cultures return    Plan: Stop vancomycin and metronidazole  Continue cefepime   Follow further speciation of proteus Follow surgical findings Follow psoas aspiration and culture  Hold on any picc line at this time    Principal Problem:   Sepsis ([facility]) Active Problems:   DM type 2 (diabetes mellitus, type 2) ([place])   HTN (hypertension)   S/P lumbar spinal fusion   Lumbar adjacent segment disease with spondylolisthesis   Iron deficiency anemia     HPI: [name] is a 66 y.o. female admitted from home for AMS.   PMHx including HTN, T2DM, asthma, HLD, ADA, AS, lumbar spondylolisthesis s/p fusion and HW replacement on  August 29th.   Originally had L4-L5, L5-S1 fusion in 2019. 01/19/22 underwent L3-L4 fusion and removal/replacement of new screw at L3-L4.  She says that her husband had been doing dressing changes to the surgical wound. She mentions that she had a drain at one point but "when that slacked off it was pulled." She does not recall much the day she was admitted and states that her husband can fill [place] in more.  Chart review at ED entry point shows she was doing well up until the night before admission when she became confused and experienced shivering and chills. She does recall that her back pain had worsened significantly in the last week; always had trouble with weakness of the right leg since surgery but has a significant amount of pain in the right groin that is new. CT lumbar spine showed acute/subacute fracture of posteror L3 body and pedicle screws and new paraspinal soft tissue edema suspicious for acute spinal infection. Blood cultures have returned positive for proteus species.    Review of Systems: Review of Systems  Constitutional:  Positive for chills and fever.  Musculoskeletal:  Positive for back pain and falls (says she slid to the floor once).  Neurological:  Positive for weakness.   - otherwise unremarkable.    Past Medical History:  Diagnosis Date   Allergy    Anemia    Anxiety    Aortic stenosis    moderate AS by echo 12/2021   Arthritis    Asthma  BMI 40.0-44.9, adult ([employer]) 04/07/2014   DM type 2 (diabetes mellitus, type 2) ([facility])    Dyspnea    GERD (gastroesophageal reflux disease)    Heart murmur    HTN (hypertension)    Hypercholesterolemia    Osteoarthritis of left hip 04/07/2014   Reflux    Spinal headache    with C-Section and with spinal fusion in 2019    Social History   Tobacco Use   Smoking status: Never   Smokeless tobacco: Never  Vaping Use   Vaping Use: Never used  Substance Use Topics   Alcohol use: No    Alcohol/week: 0.0 standard drinks of  alcohol   Drug use: No    Family History  Problem Relation Age of Onset   Hypertension Mother    Stroke Mother    Heart disease Father    Stroke Brother    Multiple sclerosis Brother    Multiple sclerosis Sister    Colon cancer Neg Hx    Colon polyps Neg Hx    Esophageal cancer Neg Hx    Rectal cancer Neg Hx    Stomach cancer Neg Hx    Allergies  Allergen Reactions   Crestor [Rosuvastatin] Other (See Comments)    Myalgia    Robaxin [Methocarbamol] Other (See Comments)    Insomnia   Zocor [Simvastatin] Other (See Comments)    Myalgias    Lipitor [Atorvastatin] Other (See Comments)    Myalgia   Sulfa Antibiotics Hives    OBJECTIVE: Blood pressure (!) 171/79, pulse (!) 104, temperature 98.9 F (37.2 C), resp. rate 19, height '4\' 10"'$  (1.473 m), weight 84.6 kg, SpO2 97 %.   Physical Exam Vitals reviewed.  Eyes:     Pupils: Pupils are equal, round, and reactive to light.  Cardiovascular:     Rate and Rhythm: Normal rate.  Pulmonary:     Effort: Pulmonary effort is normal.  Abdominal:     General: Bowel sounds are normal. There is no distension.     Palpations: Abdomen is soft.     Tenderness: There is no abdominal tenderness.  Musculoskeletal:     Comments: Back incision is clean/dry and well approximated   Skin:    General: Skin is warm.  Neurological:     Mental Status: She is oriented to person, place, and time.     Sensory: No sensory deficit.     Motor: Weakness present.     Lab Results Lab Results  Component Value Date   WBC 8.2 02/04/2022   HGB 9.5 (L) 02/04/2022   HCT 29.3 (L) 02/04/2022   MCV 77.1 (L) 02/04/2022   PLT 351 02/04/2022    Lab Results  Component Value Date   CREATININE 0.92 02/04/2022   BUN 11 02/04/2022   NA 143 02/04/2022   K 3.6 02/04/2022   CL 102 02/04/2022   CO2 23 02/04/2022    Lab Results  Component Value Date   ALT 12 02/03/2022   AST 16 02/03/2022   ALKPHOS 71 02/03/2022   BILITOT 0.6 02/03/2022      Microbiology: Recent Results (from the past 240 hour(s))  Urine Culture     Status: None   Collection Time: 02/03/22  6:25 AM   Specimen: In/Out Cath Urine  Result Value Ref Range Status   Specimen Description IN/OUT CATH URINE  Final   Special Requests NONE  Final   Culture   Final    NO GROWTH Performed at [facility], [address] [place], [place] [address]    Report Status 02/04/2022 FINAL  Final  SARS Coronavirus 2 by RT PCR (hospital order, performed in [facility] hospital lab) *cepheid single result test* Anterior Nasal Swab     Status: None   Collection Time: 02/03/22  6:26 AM   Specimen: Anterior Nasal Swab  Result Value Ref Range Status   SARS Coronavirus 2 by RT PCR NEGATIVE NEGATIVE Final    Comment: (NOTE) SARS-CoV-2 target nucleic acids are NOT DETECTED.  The SARS-CoV-2 RNA is generally detectable in upper and lower respiratory specimens during the acute phase of infection. The lowest concentration of SARS-CoV-2 viral copies this assay can detect is 250 copies / mL. A negative result does not preclude SARS-CoV-2 infection and should not be used as the sole basis for treatment or other patient management decisions.  A negative result may occur with improper specimen collection / handling, submission of specimen other than nasopharyngeal swab, presence of viral mutation(s) within the areas targeted by this assay, and inadequate number of viral copies (<250 copies / mL). A negative result must be combined with clinical observations, patient history, and epidemiological information.  Fact Sheet for Patients:   https://www.patel.info/  Fact Sheet for Healthcare Providers: https://hall.com/  This test is not yet approved or  cleared by the [place] FDA and has been authorized for detection and/or diagnosis of SARS-CoV-2 by FDA under an Emergency Use Authorization (EUA).  This EUA will remain in effect  (meaning this test can be used) for the duration of the COVID-19 declaration under Section 564(b)(1) of the Act, 21 U.S.C. section 360bbb-3(b)(1), unless the authorization is terminated or revoked sooner.  Performed at [facility], [place] [address], [place], [place] [address]   Blood Culture (routine x 2)     Status: None (Preliminary result)   Collection Time: 02/03/22  6:27 AM   Specimen: BLOOD  Result Value Ref Range Status   Specimen Description BLOOD LEFT ANTECUBITAL  Final   Special Requests   Final    BOTTLES DRAWN AEROBIC AND ANAEROBIC Blood Culture results may not be optimal due to an excessive volume of blood received in culture bottles   Culture  Setup Time   Final    GRAM NEGATIVE RODS ANAEROBIC BOTTLE ONLY CRITICAL RESULT CALLED TO, READ BACK BY AND VERIFIED WITH:  C/ PHARMD M. BITONTI 02/04/22 0025 A. LAFRANCE    Culture   Final    GRAM NEGATIVE RODS IDENTIFICATION AND SUSCEPTIBILITIES TO FOLLOW Performed at [facility], [place] [address], [place], [place] [address]    Report Status PENDING  Incomplete  Blood Culture ID Panel (Reflexed)     Status: Abnormal   Collection Time: 02/03/22  6:27 AM  Result Value Ref Range Status   Enterococcus faecalis NOT DETECTED NOT DETECTED Final   Enterococcus Faecium NOT DETECTED NOT DETECTED Final   Listeria monocytogenes NOT DETECTED NOT DETECTED Final   Staphylococcus species NOT DETECTED NOT DETECTED Final   Staphylococcus aureus (BCID) NOT DETECTED NOT DETECTED Final   Staphylococcus epidermidis NOT DETECTED NOT DETECTED Final   Staphylococcus lugdunensis NOT DETECTED NOT DETECTED Final   Streptococcus species NOT DETECTED NOT DETECTED Final   Streptococcus agalactiae NOT DETECTED NOT DETECTED Final   Streptococcus pneumoniae NOT DETECTED NOT DETECTED Final   Streptococcus pyogenes NOT DETECTED NOT DETECTED Final   A.calcoaceticus-baumannii NOT DETECTED NOT DETECTED Final   Bacteroides fragilis NOT DETECTED NOT  DETECTED Final   Enterobacterales DETECTED (A)  NOT DETECTED Final    Comment: Enterobacterales represent a large order of gram negative bacteria, not a single organism. CRITICAL RESULT CALLED TO, READ BACK BY AND VERIFIED WITH:  C/ PHARMD M. BITONTI 02/04/22 0025 A. LAFRANCE    Enterobacter cloacae complex NOT DETECTED NOT DETECTED Final   Escherichia coli NOT DETECTED NOT DETECTED Final   Klebsiella aerogenes NOT DETECTED NOT DETECTED Final   Klebsiella oxytoca NOT DETECTED NOT DETECTED Final   Klebsiella pneumoniae NOT DETECTED NOT DETECTED Final   Proteus species DETECTED (A) NOT DETECTED Final    Comment: CRITICAL RESULT CALLED TO, READ BACK BY AND VERIFIED WITH:  C/ PHARMD M. BITONTI 02/04/22 0025 A. LAFRANCE    Salmonella species NOT DETECTED NOT DETECTED Final   Serratia marcescens NOT DETECTED NOT DETECTED Final   Haemophilus influenzae NOT DETECTED NOT DETECTED Final   Neisseria meningitidis NOT DETECTED NOT DETECTED Final   Pseudomonas aeruginosa NOT DETECTED NOT DETECTED Final   Stenotrophomonas maltophilia NOT DETECTED NOT DETECTED Final   Candida albicans NOT DETECTED NOT DETECTED Final   Candida auris NOT DETECTED NOT DETECTED Final   Candida glabrata NOT DETECTED NOT DETECTED Final   Candida krusei NOT DETECTED NOT DETECTED Final   Candida parapsilosis NOT DETECTED NOT DETECTED Final   Candida tropicalis NOT DETECTED NOT DETECTED Final   Cryptococcus neoformans/gattii NOT DETECTED NOT DETECTED Final   CTX-M ESBL NOT DETECTED NOT DETECTED Final   Carbapenem resistance IMP NOT DETECTED NOT DETECTED Final   Carbapenem resistance KPC NOT DETECTED NOT DETECTED Final   Carbapenem resistance NDM NOT DETECTED NOT DETECTED Final   Carbapenem resist OXA 48 LIKE NOT DETECTED NOT DETECTED Final   Carbapenem resistance VIM NOT DETECTED NOT DETECTED Final    Comment: Performed at [facility], [address], [place], [place] [address]  Blood Culture (routine x 2)      Status: None (Preliminary result)   Collection Time: 02/03/22  8:27 AM   Specimen: BLOOD RIGHT HAND  Result Value Ref Range Status   Specimen Description BLOOD RIGHT HAND  Final   Special Requests   Final    BOTTLES DRAWN AEROBIC AND ANAEROBIC Blood Culture results may not be optimal due to an inadequate volume of blood received in culture bottles   Culture   Final    NO GROWTH < 24 HOURS Performed at [facility], [place] [address] [place], [place] [address]    Report Status PENDING  Incomplete     Janene Madeira, MSN, NP-C [facility] [email or web address] Pager: [phone] Office: [place]: [facility] [place]   02/04/2022 1:42 PM

## 2022-02-04 NOTE — Hospital Course (Addendum)
09/18 Patient slept okay. She got up yesterday and felt a lot of pain and was scared that she was going to fall. She is getting better. She got some Ambien yesterday. She understands her pain regimen and that the pain regimen is working for her. Husband at bedside states that she is getting better. They are understanding about the facility and she is agreeable to this plan. Patient is still having that right leg pain and is unable to state if it has gotten better or not. She states that this pain is coming from the thigh and going down to the right leg. Husband states that the right leg is better.    09/14 Muscle pain in the right groin area. She thinks it feels like a muscle spasm. It has been happening before she came here and has been getting pain medicine and it has been helping and she needs it very often.The back pain is not bothering her at this time but does note that last week she has felt more weight on her back and it is still the same. No pain with urination. Patient is not having urinary frequency. No accidents on her self. She does have good sensation in her groin. AxOx3 Per husband she is about the same from home and now. Yesterday at home she was spaced out per her husband and that is why he called the paramedics. She was shaking and cold. He thinks that she is not as spaced out as she was yesterday. Right leg weak since last surgery. Husband thinks it is because of pain that her right leg is not able to lift. Patient has to use walker at home.   Patient has less sensation in one of the legs Decreased strength to the right leg on hip flexion.  But good with dorsiflextion, hio extension and plantar flexion bialterally   09/15 Patients back is not hurting too bad while she rests. She feels that she has all over body aches that is improving. She notes that she is having pain in the right leg. She states that it is still weak due to pain.   There is a grade IV/VI  systolic murmur noted best  at the left sternal border.

## 2022-02-04 NOTE — Progress Notes (Signed)
PHARMACY - PHYSICIAN COMMUNICATION CRITICAL VALUE ALERT - BLOOD CULTURE IDENTIFICATION (BCID)  [name] is an 66 y.o. female who presented to [facility] on 02/03/2022 with a chief complaint of sepsis.  Name of physician (or Provider) ContactedAllyson Sabal - IMTS  Current antibiotics: vancomycin / cefepime  Changes to prescribed antibiotics recommended:  Patient is on recommended antibiotics - No changes needed - continuing broad coverage for now with spinal hardware  Results for orders placed or performed during the hospital encounter of 02/03/22  Blood Culture ID Panel (Reflexed) (Collected: 02/03/2022  6:27 AM)  Result Value Ref Range   Enterococcus faecalis NOT DETECTED NOT DETECTED   Enterococcus Faecium NOT DETECTED NOT DETECTED   Listeria monocytogenes NOT DETECTED NOT DETECTED   Staphylococcus species NOT DETECTED NOT DETECTED   Staphylococcus aureus (BCID) NOT DETECTED NOT DETECTED   Staphylococcus epidermidis NOT DETECTED NOT DETECTED   Staphylococcus lugdunensis NOT DETECTED NOT DETECTED   Streptococcus species NOT DETECTED NOT DETECTED   Streptococcus agalactiae NOT DETECTED NOT DETECTED   Streptococcus pneumoniae NOT DETECTED NOT DETECTED   Streptococcus pyogenes NOT DETECTED NOT DETECTED   A.calcoaceticus-baumannii NOT DETECTED NOT DETECTED   Bacteroides fragilis NOT DETECTED NOT DETECTED   Enterobacterales DETECTED (A) NOT DETECTED   Enterobacter cloacae complex NOT DETECTED NOT DETECTED   Escherichia coli NOT DETECTED NOT DETECTED   Klebsiella aerogenes NOT DETECTED NOT DETECTED   Klebsiella oxytoca NOT DETECTED NOT DETECTED   Klebsiella pneumoniae NOT DETECTED NOT DETECTED   Proteus species DETECTED (A) NOT DETECTED   Salmonella species NOT DETECTED NOT DETECTED   Serratia marcescens NOT DETECTED NOT DETECTED   Haemophilus influenzae NOT DETECTED NOT DETECTED   Neisseria meningitidis NOT DETECTED NOT DETECTED   Pseudomonas aeruginosa NOT DETECTED NOT  DETECTED   Stenotrophomonas maltophilia NOT DETECTED NOT DETECTED   Candida albicans NOT DETECTED NOT DETECTED   Candida auris NOT DETECTED NOT DETECTED   Candida glabrata NOT DETECTED NOT DETECTED   Candida krusei NOT DETECTED NOT DETECTED   Candida parapsilosis NOT DETECTED NOT DETECTED   Candida tropicalis NOT DETECTED NOT DETECTED   Cryptococcus neoformans/gattii NOT DETECTED NOT DETECTED   CTX-M ESBL NOT DETECTED NOT DETECTED   Carbapenem resistance IMP NOT DETECTED NOT DETECTED   Carbapenem resistance KPC NOT DETECTED NOT DETECTED   Carbapenem resistance NDM NOT DETECTED NOT DETECTED   Carbapenem resist OXA 48 LIKE NOT DETECTED NOT DETECTED   Carbapenem resistance VIM NOT DETECTED NOT DETECTED    Einar Grad 02/04/2022  12:24 AM

## 2022-02-05 ENCOUNTER — Inpatient Hospital Stay (HOSPITAL_COMMUNITY): Payer: Medicare HMO

## 2022-02-05 ENCOUNTER — Other Ambulatory Visit: Payer: Self-pay

## 2022-02-05 ENCOUNTER — Encounter (HOSPITAL_COMMUNITY)
Admission: EM | Disposition: A | Discharge status: Skilled Nursing Facility | Payer: Self-pay | Source: Home / Self Care | Attending: Neurosurgery

## 2022-02-05 ENCOUNTER — Inpatient Hospital Stay (HOSPITAL_COMMUNITY): Payer: Medicare HMO | Admitting: Certified Registered"

## 2022-02-05 ENCOUNTER — Encounter (HOSPITAL_COMMUNITY): Payer: Self-pay | Admitting: Internal Medicine

## 2022-02-05 DIAGNOSIS — R7881 Bacteremia: Secondary | ICD-10-CM

## 2022-02-05 DIAGNOSIS — M4316 Spondylolisthesis, lumbar region: Secondary | ICD-10-CM | POA: Diagnosis not present

## 2022-02-05 DIAGNOSIS — G9340 Encephalopathy, unspecified: Secondary | ICD-10-CM | POA: Diagnosis not present

## 2022-02-05 DIAGNOSIS — T8484XA Pain due to internal orthopedic prosthetic devices, implants and grafts, initial encounter: Secondary | ICD-10-CM | POA: Diagnosis not present

## 2022-02-05 DIAGNOSIS — A419 Sepsis, unspecified organism: Secondary | ICD-10-CM | POA: Diagnosis not present

## 2022-02-05 DIAGNOSIS — J449 Chronic obstructive pulmonary disease, unspecified: Secondary | ICD-10-CM | POA: Diagnosis not present

## 2022-02-05 DIAGNOSIS — T84216A Breakdown (mechanical) of internal fixation device of vertebrae, initial encounter: Secondary | ICD-10-CM | POA: Diagnosis present

## 2022-02-05 DIAGNOSIS — I10 Essential (primary) hypertension: Secondary | ICD-10-CM

## 2022-02-05 DIAGNOSIS — R6520 Severe sepsis without septic shock: Secondary | ICD-10-CM | POA: Diagnosis not present

## 2022-02-05 DIAGNOSIS — D500 Iron deficiency anemia secondary to blood loss (chronic): Secondary | ICD-10-CM | POA: Diagnosis not present

## 2022-02-05 DIAGNOSIS — B964 Proteus (mirabilis) (morganii) as the cause of diseases classified elsewhere: Secondary | ICD-10-CM

## 2022-02-05 DIAGNOSIS — D649 Anemia, unspecified: Secondary | ICD-10-CM

## 2022-02-05 LAB — BASIC METABOLIC PANEL
Anion gap: 13 (ref 5–15)
BUN: 9 mg/dL (ref 8–23)
CO2: 25 mmol/L (ref 22–32)
Calcium: 9.6 mg/dL (ref 8.9–10.3)
Chloride: 102 mmol/L (ref 98–111)
Creatinine, Ser: 0.89 mg/dL (ref 0.44–1.00)
GFR, Estimated: >60 mL/min (ref >60)
Glucose, Bld: 135 mg/dL — ABNORMAL HIGH (ref 70–99)
Potassium: 3.7 mmol/L (ref 3.5–5.1)
Sodium: 140 mmol/L (ref 135–145)

## 2022-02-05 LAB — GLUCOSE, CAPILLARY
Glucose-Capillary: 129 mg/dL — ABNORMAL HIGH (ref 70–99)
Glucose-Capillary: 124 mg/dL — ABNORMAL HIGH (ref 70–99)
Glucose-Capillary: 135 mg/dL — ABNORMAL HIGH (ref 70–99)
Glucose-Capillary: 123 mg/dL — ABNORMAL HIGH (ref 70–99)
Glucose-Capillary: 157 mg/dL — ABNORMAL HIGH (ref 70–99)

## 2022-02-05 LAB — CULTURE, BLOOD (ROUTINE X 2)

## 2022-02-05 LAB — CBC
HCT: 30.9 % — ABNORMAL LOW (ref 36.0–46.0)
Hemoglobin: 9.8 g/dL — ABNORMAL LOW (ref 12.0–15.0)
MCH: 24.7 pg — ABNORMAL LOW (ref 26.0–34.0)
MCHC: 31.7 g/dL (ref 30.0–36.0)
MCV: 77.8 fL — ABNORMAL LOW (ref 80.0–100.0)
Platelets: 361 10*3/uL (ref 150–400)
RBC: 3.97 MIL/uL (ref 3.87–5.11)
RDW: 15.8 % — ABNORMAL HIGH (ref 11.5–15.5)
WBC: 8.9 10*3/uL (ref 4.0–10.5)
nRBC: 0 % (ref 0.0–0.2)

## 2022-02-05 LAB — TYPE AND SCREEN
ABO/RH(D): O POS
Antibody Screen: NEGATIVE

## 2022-02-05 SURGERY — POSTERIOR LUMBAR FUSION 1 LEVEL
Anesthesia: General | Site: Spine Lumbar

## 2022-02-05 MED ORDER — ONDANSETRON HCL 4 MG/2ML IJ SOLN
INTRAMUSCULAR | Status: DC | PRN
  Administered 2022-02-05: 4 mg via INTRAVENOUS

## 2022-02-05 MED ORDER — PHENYLEPHRINE HCL-NACL 20-0.9 MG/250ML-% IV SOLN
INTRAVENOUS | Status: DC | PRN
  Administered 2022-02-05: 45 ug/min via INTRAVENOUS

## 2022-02-05 MED ORDER — PHENYLEPHRINE HCL (PRESSORS) 10 MG/ML IV SOLN
INTRAVENOUS | Status: AC
  Filled 2022-02-05: qty 1

## 2022-02-05 MED ORDER — LABETALOL HCL 5 MG/ML IV SOLN
INTRAVENOUS | Status: DC | PRN
  Administered 2022-02-05: 5 mg via INTRAVENOUS

## 2022-02-05 MED ORDER — ALBUMIN HUMAN 5 % IV SOLN: INTRAVENOUS | Status: DC | PRN

## 2022-02-05 MED ORDER — OXYCODONE HCL 5 MG PO TABS: 5.0000 mg | ORAL_TABLET | Freq: Once | ORAL | Status: DC | PRN

## 2022-02-05 MED ORDER — 0.9 % SODIUM CHLORIDE (POUR BTL) OPTIME
TOPICAL | Status: DC | PRN
  Administered 2022-02-05: 1000 mL

## 2022-02-05 MED ORDER — VANCOMYCIN HCL 1000 MG IV SOLR
INTRAVENOUS | Status: DC | PRN
  Administered 2022-02-05: 1000 mg via TOPICAL

## 2022-02-05 MED ORDER — BUPIVACAINE HCL (PF) 0.5 % IJ SOLN
INTRAMUSCULAR | Status: AC
  Filled 2022-02-05: qty 30

## 2022-02-05 MED ORDER — SUCCINYLCHOLINE CHLORIDE 200 MG/10ML IV SOSY
PREFILLED_SYRINGE | INTRAVENOUS | Status: AC
  Filled 2022-02-05: qty 10

## 2022-02-05 MED ORDER — LIDOCAINE 2% (20 MG/ML) 5 ML SYRINGE
INTRAMUSCULAR | Status: AC
  Filled 2022-02-05: qty 20

## 2022-02-05 MED ORDER — FENTANYL CITRATE (PF) 250 MCG/5ML IJ SOLN
INTRAMUSCULAR | Status: AC
  Filled 2022-02-05: qty 5

## 2022-02-05 MED ORDER — HYDROMORPHONE HCL 1 MG/ML IJ SOLN
INTRAMUSCULAR | Status: AC
  Filled 2022-02-05: qty 0.5

## 2022-02-05 MED ORDER — DEXAMETHASONE SODIUM PHOSPHATE 10 MG/ML IJ SOLN: INTRAMUSCULAR | Status: DC | PRN

## 2022-02-05 MED ORDER — LACTATED RINGERS IV SOLN: INTRAVENOUS | Status: DC

## 2022-02-05 MED ORDER — FENTANYL CITRATE (PF) 100 MCG/2ML IJ SOLN
INTRAMUSCULAR | Status: AC
  Filled 2022-02-05: qty 2

## 2022-02-05 MED ORDER — CHLORHEXIDINE GLUCONATE CLOTH 2 % EX PADS: 6.0000 | MEDICATED_PAD | Freq: Once | CUTANEOUS | Status: DC

## 2022-02-05 MED ORDER — PHENYLEPHRINE 80 MCG/ML (10ML) SYRINGE FOR IV PUSH (FOR BLOOD PRESSURE SUPPORT)
PREFILLED_SYRINGE | INTRAVENOUS | Status: AC
  Filled 2022-02-05: qty 30

## 2022-02-05 MED ORDER — MIDAZOLAM HCL 2 MG/2ML IJ SOLN
INTRAMUSCULAR | Status: DC | PRN
  Administered 2022-02-05 (×2): 1 mg via INTRAVENOUS

## 2022-02-05 MED ORDER — DEXAMETHASONE SODIUM PHOSPHATE 10 MG/ML IJ SOLN
INTRAMUSCULAR | Status: DC | PRN
  Administered 2022-02-05: 5 mg via INTRAVENOUS

## 2022-02-05 MED ORDER — LACTATED RINGERS IV SOLN: INTRAVENOUS | Status: DC | PRN

## 2022-02-05 MED ORDER — CHLORHEXIDINE GLUCONATE 0.12 % MT SOLN
OROMUCOSAL | Status: AC
  Filled 2022-02-05: qty 15

## 2022-02-05 MED ORDER — CEFAZOLIN SODIUM-DEXTROSE 2-4 GM/100ML-% IV SOLN
2.0000 g | INTRAVENOUS | Status: AC
  Administered 2022-02-05 (×2): 2 g via INTRAVENOUS

## 2022-02-05 MED ORDER — HYDROMORPHONE HCL 1 MG/ML IJ SOLN
INTRAMUSCULAR | Status: DC | PRN
  Administered 2022-02-05 (×3): .5 mg via INTRAVENOUS

## 2022-02-05 MED ORDER — DEXAMETHASONE SODIUM PHOSPHATE 10 MG/ML IJ SOLN
INTRAMUSCULAR | Status: AC
  Filled 2022-02-05: qty 3

## 2022-02-05 MED ORDER — ROCURONIUM BROMIDE 10 MG/ML (PF) SYRINGE
PREFILLED_SYRINGE | INTRAVENOUS | Status: AC
  Filled 2022-02-05: qty 60

## 2022-02-05 MED ORDER — BACITRACIN ZINC 500 UNIT/GM EX OINT
TOPICAL_OINTMENT | CUTANEOUS | Status: AC
  Filled 2022-02-05: qty 28.35

## 2022-02-05 MED ORDER — BUPIVACAINE HCL (PF) 0.5 % IJ SOLN
INTRAMUSCULAR | Status: DC | PRN
  Administered 2022-02-05: 30 mL

## 2022-02-05 MED ORDER — THROMBIN 20000 UNITS EX SOLR
CUTANEOUS | Status: AC
  Filled 2022-02-05: qty 20000

## 2022-02-05 MED ORDER — EPHEDRINE 5 MG/ML INJ
INTRAVENOUS | Status: AC
  Filled 2022-02-05: qty 10

## 2022-02-05 MED ORDER — THROMBIN 20000 UNITS EX SOLR: CUTANEOUS | Status: DC | PRN

## 2022-02-05 MED ORDER — ORAL CARE MOUTH RINSE: 15.0000 mL | Freq: Once | OROMUCOSAL | Status: DC

## 2022-02-05 MED ORDER — LIDOCAINE-EPINEPHRINE 0.5 %-1:200000 IJ SOLN
INTRAMUSCULAR | Status: DC | PRN
  Administered 2022-02-05: 10 mL

## 2022-02-05 MED ORDER — FENTANYL CITRATE (PF) 100 MCG/2ML IJ SOLN
25.0000 ug | INTRAMUSCULAR | Status: DC | PRN
  Administered 2022-02-05 (×2): 50 ug via INTRAVENOUS

## 2022-02-05 MED ORDER — MIDAZOLAM HCL 2 MG/2ML IJ SOLN
INTRAMUSCULAR | Status: AC
  Filled 2022-02-05: qty 2

## 2022-02-05 MED ORDER — ROCURONIUM BROMIDE 10 MG/ML (PF) SYRINGE
PREFILLED_SYRINGE | INTRAVENOUS | Status: DC | PRN
  Administered 2022-02-05: 20 mg via INTRAVENOUS
  Administered 2022-02-05: 40 mg via INTRAVENOUS
  Administered 2022-02-05: 20 mg via INTRAVENOUS
  Administered 2022-02-05: 60 mg via INTRAVENOUS

## 2022-02-05 MED ORDER — LIDOCAINE 2% (20 MG/ML) 5 ML SYRINGE
INTRAMUSCULAR | Status: DC | PRN
  Administered 2022-02-05: 100 mg via INTRAVENOUS

## 2022-02-05 MED ORDER — CHLORHEXIDINE GLUCONATE 0.12 % MT SOLN: 15.0000 mL | Freq: Once | OROMUCOSAL | Status: DC

## 2022-02-05 MED ORDER — VANCOMYCIN HCL 1000 MG IV SOLR
INTRAVENOUS | Status: AC
  Filled 2022-02-05: qty 20

## 2022-02-05 MED ORDER — LIDOCAINE-EPINEPHRINE 0.5 %-1:200000 IJ SOLN
INTRAMUSCULAR | Status: AC
  Filled 2022-02-05: qty 1

## 2022-02-05 MED ORDER — FENTANYL CITRATE (PF) 250 MCG/5ML IJ SOLN
INTRAMUSCULAR | Status: DC | PRN
  Administered 2022-02-05 (×2): 50 ug via INTRAVENOUS
  Administered 2022-02-05: 100 ug via INTRAVENOUS
  Administered 2022-02-05: 50 ug via INTRAVENOUS
  Administered 2022-02-05: 100 ug via INTRAVENOUS
  Administered 2022-02-05 (×3): 50 ug via INTRAVENOUS

## 2022-02-05 MED ORDER — PHENYLEPHRINE 80 MCG/ML (10ML) SYRINGE FOR IV PUSH (FOR BLOOD PRESSURE SUPPORT)
PREFILLED_SYRINGE | INTRAVENOUS | Status: DC | PRN
  Administered 2022-02-05: 80 ug via INTRAVENOUS
  Administered 2022-02-05: 160 ug via INTRAVENOUS
  Administered 2022-02-05: 80 ug via INTRAVENOUS

## 2022-02-05 MED ORDER — SUGAMMADEX SODIUM 200 MG/2ML IV SOLN
INTRAVENOUS | Status: DC | PRN
  Administered 2022-02-05: 200 mg via INTRAVENOUS

## 2022-02-05 MED ORDER — PROPOFOL 10 MG/ML IV BOLUS
INTRAVENOUS | Status: AC
  Filled 2022-02-05: qty 20

## 2022-02-05 MED ORDER — ONDANSETRON HCL 4 MG/2ML IJ SOLN
INTRAMUSCULAR | Status: AC
  Filled 2022-02-05: qty 4

## 2022-02-05 MED ORDER — OXYCODONE HCL 5 MG/5ML PO SOLN: 5.0000 mg | Freq: Once | ORAL | Status: DC | PRN

## 2022-02-05 MED ORDER — CEFAZOLIN SODIUM-DEXTROSE 2-4 GM/100ML-% IV SOLN
2.0000 g | Freq: Three times a day (TID) | INTRAVENOUS | Status: DC
  Administered 2022-02-05 – 2022-02-08 (×8): 2 g via INTRAVENOUS
  Filled 2022-02-05 (×9): qty 100

## 2022-02-05 MED ORDER — PROPOFOL 10 MG/ML IV BOLUS
INTRAVENOUS | Status: DC | PRN
  Administered 2022-02-05: 150 mg via INTRAVENOUS

## 2022-02-05 MED ORDER — BACITRACIN ZINC 500 UNIT/GM EX OINT
TOPICAL_OINTMENT | CUTANEOUS | Status: DC | PRN
  Administered 2022-02-05: 1 via TOPICAL

## 2022-02-05 MED ORDER — ONDANSETRON HCL 4 MG/2ML IJ SOLN: 4.0000 mg | Freq: Four times a day (QID) | INTRAMUSCULAR | Status: DC | PRN

## 2022-02-05 SURGICAL SUPPLY — 60 items
ADH SKN CLS APL DERMABOND .7 (GAUZE/BANDAGES/DRESSINGS) ×1
BAG COUNTER SPONGE SURGICOUNT (BAG) ×1 IMPLANT
BAG SPNG CNTER NS LX DISP (BAG) ×2
BIT DRILL PLIF MAS DISP 5.5MM (DRILL) IMPLANT
BLADE CLIPPER SURG (BLADE) IMPLANT
BUR MATCHSTICK NEURO 3.0 LAGG (BURR) ×1 IMPLANT
BUR PRECISION FLUTE 5.0 (BURR) ×1 IMPLANT
CANISTER SUCT 3000ML PPV (MISCELLANEOUS) ×1 IMPLANT
CAP RELINE MOD TULIP RMM (Cap) IMPLANT
CARTRIDGE OIL MAESTRO DRILL (MISCELLANEOUS) ×1 IMPLANT
CNTNR URN SCR LID CUP LEK RST (MISCELLANEOUS) ×1 IMPLANT
CONT SPEC 4OZ STRL OR WHT (MISCELLANEOUS) ×1
COVER BACK TABLE 60X90IN (DRAPES) ×1 IMPLANT
DERMABOND ADVANCED .7 DNX12 (GAUZE/BANDAGES/DRESSINGS) ×1 IMPLANT
DIFFUSER DRILL AIR PNEUMATIC (MISCELLANEOUS) ×1 IMPLANT
DRAPE C-ARM 42X72 X-RAY (DRAPES) ×2 IMPLANT
DRAPE C-ARMOR (DRAPES) IMPLANT
DRAPE LAPAROTOMY 100X72X124 (DRAPES) ×1 IMPLANT
DRILL PLIF MAS DISP 5.5MM (DRILL) ×1
DRSG OPSITE POSTOP 4X10 (GAUZE/BANDAGES/DRESSINGS) IMPLANT
DURAPREP 26ML APPLICATOR (WOUND CARE) ×1 IMPLANT
ELECT REM PT RETURN 9FT ADLT (ELECTROSURGICAL) ×1
ELECTRODE REM PT RTRN 9FT ADLT (ELECTROSURGICAL) ×1 IMPLANT
GAUZE SPONGE 4X4 12PLY STRL (GAUZE/BANDAGES/DRESSINGS) IMPLANT
GLOVE SURG LTX SZ6.5 (GLOVE) ×2 IMPLANT
GOWN STRL REUS W/ TWL LRG LVL3 (GOWN DISPOSABLE) ×2 IMPLANT
GOWN STRL REUS W/ TWL XL LVL3 (GOWN DISPOSABLE) IMPLANT
GOWN STRL REUS W/TWL 2XL LVL3 (GOWN DISPOSABLE) IMPLANT
GOWN STRL REUS W/TWL LRG LVL3 (GOWN DISPOSABLE) ×2
GOWN STRL REUS W/TWL XL LVL3 (GOWN DISPOSABLE) ×2
GRAFT BONE PROTEIOS LRG 5CC (Orthopedic Implant) IMPLANT
GRAFT BONE PROTEIOS MED 2.5CC (Orthopedic Implant) IMPLANT
KIT BASIN OR (CUSTOM PROCEDURE TRAY) ×1 IMPLANT
KIT POSITION SURG JACKSON T1 (MISCELLANEOUS) ×1 IMPLANT
KIT TURNOVER KIT B (KITS) ×1 IMPLANT
NDL HYPO 25X1 1.5 SAFETY (NEEDLE) ×1 IMPLANT
NDL SPNL 18GX3.5 QUINCKE PK (NEEDLE) IMPLANT
NEEDLE HYPO 25X1 1.5 SAFETY (NEEDLE) ×1 IMPLANT
NEEDLE SPNL 18GX3.5 QUINCKE PK (NEEDLE) ×1 IMPLANT
NS IRRIG 1000ML POUR BTL (IV SOLUTION) ×1 IMPLANT
OIL CARTRIDGE MAESTRO DRILL (MISCELLANEOUS) ×1
PACK LAMINECTOMY NEURO (CUSTOM PROCEDURE TRAY) ×1 IMPLANT
PAD ARMBOARD 7.5X6 YLW CONV (MISCELLANEOUS) ×2 IMPLANT
ROD RELINE O COCR 5.0X90MM (Rod) IMPLANT
SCREW LOCK RSS 4.5/5.0MM (Screw) IMPLANT
SCREW SHANK RELINE MOD 5.5X35 (Screw) IMPLANT
SCREW SHANK RELINE O 8.5X3 (Screw) IMPLANT
SPONGE SURGIFOAM ABS GEL 100 (HEMOSTASIS) ×1 IMPLANT
STRIP CLOSURE SKIN 1/2X4 (GAUZE/BANDAGES/DRESSINGS) IMPLANT
SUT ETHILON 3 0 FSL (SUTURE) IMPLANT
SUT VIC AB 0 CT1 18XCR BRD8 (SUTURE) ×1 IMPLANT
SUT VIC AB 0 CT1 8-18 (SUTURE) ×2
SUT VIC AB 2-0 CT1 18 (SUTURE) ×1 IMPLANT
SUT VIC AB 3-0 SH 8-18 (SUTURE) ×1 IMPLANT
SWAB CULTURE ESWAB REG 1ML (MISCELLANEOUS) IMPLANT
SWAB CULTURE LIQ STUART DBL (MISCELLANEOUS) IMPLANT
TOWEL GREEN STERILE (TOWEL DISPOSABLE) ×1 IMPLANT
TOWEL GREEN STERILE FF (TOWEL DISPOSABLE) ×1 IMPLANT
TRAY FOLEY MTR SLVR 16FR STAT (SET/KITS/TRAYS/PACK) ×1 IMPLANT
WATER STERILE IRR 1000ML POUR (IV SOLUTION) ×1 IMPLANT

## 2022-02-05 NOTE — Transfer of Care (Signed)
Immediate Anesthesia Transfer of Care Note  Patient: [name]  Procedure(s) Performed: Removal of Lumbar Two-Three Interbody Cages, Screws and Rods with Placement  of Sacral Screws, Pedicle Screw Arthrodesis Lumbar Two-Lumbar Four, Pedicle Screw Fixation from Lumbar Two-Sacral One (Spine Lumbar)  Patient Location: PACU  Anesthesia Type:General  Level of Consciousness: awake  Airway & Oxygen Therapy: Patient Spontanous Breathing  Post-op Assessment: Report given to RN and Post -op Vital signs reviewed and stable  Post vital signs: Reviewed and stable  Last Vitals:  Vitals Value Taken Time  BP 163/103 02/05/22 2330  Temp 36.7 C 02/05/22 2322  Pulse 103 02/05/22 2335  Resp 13 02/05/22 2335  SpO2 99 % 02/05/22 2335  Vitals shown include unvalidated device data.  Last Pain:  Vitals:   02/05/22 1729  TempSrc:   PainSc: 4       Patients Stated Pain Goal: 0 (90/38/33 3832)  Complications: No notable events documented.

## 2022-02-05 NOTE — Anesthesia Procedure Notes (Signed)
Procedure Name: Intubation Date/Time: 02/05/2022 6:50 PM  Performed by: Darletta Moll, CRNAPre-anesthesia Checklist: Patient identified, Emergency Drugs available, Suction available and Patient being monitored Patient Re-evaluated:Patient Re-evaluated prior to induction Oxygen Delivery Method: Circle system utilized Preoxygenation: Pre-oxygenation with 100% oxygen Induction Type: IV induction Ventilation: Mask ventilation without difficulty Laryngoscope Size: Mac and 3 Grade View: Grade I Tube type: Oral Tube size: 7.0 mm Number of attempts: 1 Airway Equipment and Method: Stylet and Oral airway Placement Confirmation: ETT inserted through vocal cords under direct vision, positive ETCO2 and breath sounds checked- equal and bilateral Secured at: 21 cm Tube secured with: Tape Dental Injury: Teeth and Oropharynx as per pre-operative assessment

## 2022-02-05 NOTE — Progress Notes (Signed)
Patient arrived in short stay from the floor. BP elevated 182/82. Dr. Glennon Mac was notified.

## 2022-02-05 NOTE — Progress Notes (Addendum)
[facility]  Date of Admission:  02/03/2022   Total days of inpatient antibiotics 2  Principal Problem:   Sepsis ([place]) Active Problems:   DM type 2 (diabetes mellitus, type 2) (HCC)   HTN (hypertension)   S/P lumbar spinal fusion   Lumbar adjacent segment disease with spondylolisthesis   Iron deficiency anemia   Bacteremia due to Proteus species          Assessment: 66 year old female who underwent Ll4-L5, L5-S1 fusion on 2019, recently underwent L3-L4 fusion and removal of L4 and S1 pedicle screws, placement of L3-L4 screws on 01/13/2022 admitted for lumbar infection.   #Postop lumbar infection with hardware in place #Psoas abscess #Proteus mirabilis bacteremia - Patient reports her drain came out about a week ago.  She started having worsening pain about a week ago as well.  The drain has just "slacked off".  She also notes left groin pain radiating down her leg. - She is afebrile without leukocytosis on admission. - Blood cultures growing Proteus, 1 out of 2.  Currently on Vanco cefepime and metronidazole. - On 9/13 showed soft tissue abscess extends anteriorly to fusion hardware L3-L2 L4 vertebral body.  The abscess extends into epidural space at L3 and L4 where there is epidural phlegmon resulting in severe canal stenosis.  1.1 collection in the right musculature suspicious for sepsis.  Perihardware fracture of L3 vertebral body. -IR was engaged and psoas collection not accessible.Groin pain persists Recommendations: - Discontinue  cefepime - Start Ancef. I think following OR Cx and HWR removal we may be able to just target the proteus and treat with 8 weeks of antibiotics. Hopefully, the groin pain improves with antibiotics if it is due to the small abscess.  - Follow  blood cultures - OR today with neurosurgery, please obtain Cx  Dr. Linus Salmons will be covering this weekend.   Microbiology:   Antibiotics: Vancomycin 9/13-14 Cefepime  9/13-14 Metronidazole 9/13  Cultures: Blood 9/13 1/2 proteus mirabilis   SUBJECTIVE: Resting in bed. Reports left groin is still painful.  Interval:  afebrile overnight.  Review of Systems: Review of Systems  All other systems reviewed and are negative.    Scheduled Meds:  amLODipine  10 mg Oral Daily   Continuous Infusions:   ceFAZolin (ANCEF) IV 2 g (02/05/22 1103)   lactated ringers Stopped (02/03/22 0723)   methocarbamol (ROBAXIN) IV 500 mg (02/05/22 0513)   PRN Meds:.albuterol, HYDROmorphone (DILAUDID) injection Allergies  Allergen Reactions   Crestor [Rosuvastatin] Other (See Comments)    Myalgia    Robaxin [Methocarbamol] Other (See Comments)    Insomnia   Toradol [Ketorolac Tromethamine] Other (See Comments)   Zocor [Simvastatin] Other (See Comments)    Myalgias    Lipitor [Atorvastatin] Other (See Comments)    Myalgia   Sulfa Antibiotics Hives    OBJECTIVE: Vitals:   02/04/22 1933 02/05/22 0500 02/05/22 0550 02/05/22 0837  BP: (!) 154/82  (!) 184/84 (!) 144/76  Pulse: 92  100 92  Resp: '16  15 13  '$ Temp:   98.4 F (36.9 C) 99.1 F (37.3 C)  TempSrc:   Oral Oral  SpO2:   98% 98%  Weight:  86.5 kg    Height:       Body mass index is 39.86 kg/m.  Physical Exam Constitutional:      Appearance: Normal appearance.  HENT:     Head: Normocephalic and atraumatic.     Right Ear:  Tympanic membrane normal.     Left Ear: Tympanic membrane normal.     Nose: Nose normal.     Mouth/Throat:     Mouth: Mucous membranes are moist.  Eyes:     Extraocular Movements: Extraocular movements intact.     Conjunctiva/sclera: Conjunctivae normal.     Pupils: Pupils are equal, round, and reactive to light.  Cardiovascular:     Rate and Rhythm: Normal rate and regular rhythm.     Heart sounds: No murmur heard.    No friction rub. No gallop.  Pulmonary:     Effort: Pulmonary effort is normal.     Breath sounds: Normal breath sounds.  Abdominal:     General:  Abdomen is flat.     Palpations: Abdomen is soft.  Skin:    General: Skin is warm and dry.  Neurological:     General: No focal deficit present.     Mental Status: She is alert and oriented to person, place, and time.  Psychiatric:        Mood and Affect: Mood normal.       Lab Results Lab Results  Component Value Date   WBC 8.9 02/05/2022   HGB 9.8 (L) 02/05/2022   HCT 30.9 (L) 02/05/2022   MCV 77.8 (L) 02/05/2022   PLT 361 02/05/2022    Lab Results  Component Value Date   CREATININE 0.89 02/05/2022   BUN 9 02/05/2022   NA 140 02/05/2022   K 3.7 02/05/2022   CL 102 02/05/2022   CO2 25 02/05/2022    Lab Results  Component Value Date   ALT 12 02/03/2022   AST 16 02/03/2022   ALKPHOS 71 02/03/2022   BILITOT 0.6 02/03/2022        Laurice Record, [facility] 02/05/2022, 12:30 PM

## 2022-02-05 NOTE — Anesthesia Procedure Notes (Signed)
Arterial Line Insertion Start/End9/15/2023 8:09 PM Performed by: Albertha Ghee, MD, anesthesiologist  Patient location: Pre-op. Preanesthetic checklist: patient identified, IV checked, site marked, risks and benefits discussed, surgical consent, monitors and equipment checked, pre-op evaluation, timeout performed and anesthesia consent Lidocaine 1% used for infiltration Left, radial was placed Catheter size: 20 G Hand hygiene performed  and maximum sterile barriers used   Attempts: 1 Procedure performed using ultrasound guided technique. Ultrasound Notes:anatomy identified, needle tip was noted to be adjacent to the nerve/plexus identified and no ultrasound evidence of intravascular and/or intraneural injection Following insertion, Biopatch and dressing applied. Post procedure assessment: normal  Patient tolerated the procedure well with no immediate complications.

## 2022-02-05 NOTE — Care Management Important Message (Signed)
Important Message  Patient Details  Name: [name] MRN: [id number] Date of Birth: 03-16-1956   Medicare Important Message Given:  Yes     Hannah Beat 02/05/2022, 12:10 PM

## 2022-02-05 NOTE — Progress Notes (Addendum)
NAME:  ARINA TORRY, MRN:  [id number], DOB:  24-Jan-1956, LOS: 2 ADMISSION DATE:  02/03/2022  Subjective  NAEON  Interim history: Patient was seen this AM in bed. Patient's back is not hurting too bad while she rests. She feels that she has all over body aches that is improving. She notes that she is having pain in the right leg that is similar compared to yesterday's assessment. She states that it is still weak due to pain.   Objective   Blood pressure (!) 144/76, pulse 92, temperature 99.1 F (37.3 C), temperature source Oral, resp. rate 13, height '4\' 10"'$  (1.473 m), weight 86.5 kg, SpO2 98 %.     Intake/Output Summary (Last 24 hours) at 02/05/2022 0627 Last data filed at 02/05/2022 0300 Gross per 24 hour  Intake 251.25 ml  Output --  Net 251.25 ml   Filed Weights   02/03/22 0620 02/04/22 0500 02/05/22 0500  Weight: 87.1 kg 84.6 kg 86.5 kg   Physical Exam: General: Pleasant in bed. No acute distress. CV: Grade 4/6 crescendo decrescendo murmur heard at the upper sternal border Pulmonary: Lungs CTAB. Normal effort. No wheezing or rales. Abdominal: Soft, nontender, nondistended. Normal bowel sounds. Extremities: Radial and DP pulses 2+ and symmetric. Weakness of right hip flexion, improving compared to yesterday. At least 3/5 left hip flexion. 5/5 strength of leg extension, foot extension/flexion bilaterally.  Skin: Warm and dry. Neuro: A&O. No focal deficit. Psych: Normal mood and affect   Labs       Latest Ref Rng & Units 02/05/2022    1:15 AM 02/04/2022    1:01 AM 02/03/2022    6:27 AM  CBC  WBC 4.0 - 10.5 K/uL 8.9  8.2  10.0   Hemoglobin 12.0 - 15.0 g/dL 9.8  9.5  9.9   Hematocrit 36.0 - 46.0 % 30.9  29.3  31.1   Platelets 150 - 400 K/uL 361  351  422       Latest Ref Rng & Units 02/05/2022    1:15 AM 02/04/2022    1:01 AM 02/03/2022    6:27 AM  BMP  Glucose 70 - 99 mg/dL 135  136  151   BUN 8 - 23 mg/dL '9  11  26   '$ Creatinine 0.44 - 1.00 mg/dL 0.89  0.92  1.11    Sodium 135 - 145 mmol/L 140  143  139   Potassium 3.5 - 5.1 mmol/L 3.7  3.6  3.7   Chloride 98 - 111 mmol/L 102  102  101   CO2 22 - 32 mmol/L '25  23  24   '$ Calcium 8.9 - 10.3 mg/dL 9.6  9.5  9.9     Blood culture 1/4 bottles: Proteus    Summary  Meggie Laseter is a 66 year old female with past medical history of hypertension, type 2 diabetes, asthma, hyperlipidemia, IDA, aortic stenosis, lumbar spondylolisthesis status post lumbar fusion and screw replacement who was brought to the ED due to altered mental status, admitted for sepsis secondary to Proteus bacteremia and spinal infection. Today she is planned to have surgery for hardware removal.  Assessment & Plan:  Principal Problem:   Sepsis ([place]) Active Problems:   DM type 2 (diabetes mellitus, type 2) ([place])   HTN (hypertension)   S/P lumbar spinal fusion   Lumbar adjacent segment disease with spondylolisthesis   Iron deficiency anemia  Sepsis: 3/4 SIRS criteria with encephalopathy C/f spinal infection, epidural abscess, discitis/osteomyelitis Lumbar spondylolisthesis status post lumbar spinal  fusion Patient is admitted for sepsis secondary to possible spinal infection at the site of recent L3-L4 lumbar fusion with screw placement of L3, L4. WBC remains WNL.  Blood culture shows proteus, treating with cefepime. Lumbar MRI shows severe spinal canal stenosis, discitis/osteomyelitis, and psoas abscess. Neurosurgery will do hardware removal today. -Neurosurgery following, appreciate recs -Consult with ID, appreciate recs -Continue cefepime 2 g IV at 200 mL/hr Q12H -Carb modified diet, N.p.o. at midnight for surgery today -Pain control with Dilaudid and Robaxin IV as needed -Morning BMP  Psoas fluid collection, c/f abscess Patient reporting pain on her right anterior thigh and right lower abdominal region, similar to yesterday. MRI showed small 1.1 x 0.8 cm fluid collection in the right psoas, musculature is suspicious for a psoas  abscess. IR consulted and notes that it is too small for drainage so treatment will be continuing abx at this time and if abscess grows >3 cm, reassess for drainage. -Consult with ID, appreciate recs -Continue cefepime 2 g IV at 200 mL/hr Q12H  Iron deficiency anemia Per notes, patient has IDA from inadequate intake. Patient is taking iron supplement at home. Iron panel shows iron 17, TIBC 241, and ferritin 212 which indicates an element of an anemia of chronic disease superimposed with her chronic iron deficiency anemia. Difficult to interpret in the setting of acute infection. Hgb trend: 9.5>9.8. -Continue to monitor closely -Trend CBCs   Hypertension SBP ranging from 110s-180s, most recently in the 140s. Restarting amlodipine as patient is no longer hypotensive and SBP is increasing.  -Continue amlodipine 10 mg daily   Type 2 diabetes Diabetes well controlled with A1c of 6.2 3 weeks ago.  She only takes metformin at home. -Holding metformin while n.p.o. -CBG daily    Best practice:  Full code Diet: N.p.o. IVF: LR DVT: SCD pending neurosurgery evaluation  Stormy Fabian, MD Internal Medicine Resident PGY-1 PAGER: [phone] 02/05/2022 6:27 AM  If after hours (below), please contact on-call pager: [phone] 5PM-7AM Monday-Friday 1PM-7AM Saturday-Sunday

## 2022-02-05 NOTE — Progress Notes (Signed)
Patient ID: [name], female   DOB: 1955-07-02, 66 y.o.   MRN: [id number] BP (!) 144/76   Pulse 92   Temp 99.1 F (37.3 C) (Oral)   Resp 13   Ht '4\' 10"'$  (1.473 m)   Wt 86.5 kg   SpO2 98%   BMI 39.86 kg/m  Alert and oriented x 4 Moving all extremities, complaining of lower extremity pain.  OR for hardware revision

## 2022-02-05 NOTE — Anesthesia Preprocedure Evaluation (Addendum)
Anesthesia Evaluation  Patient identified by MRN, date of birth, ID band Patient awake    Reviewed: Allergy & Precautions, H&P , NPO status , Patient's Chart, lab work & pertinent test results  History of Anesthesia Complications Negative for: history of anesthetic complications  Airway Mallampati: II   Neck ROM: full    Dental   Pulmonary asthma , COPD,  COPD inhaler,    breath sounds clear to auscultation       Cardiovascular hypertension, Pt. on medications and Pt. on home beta blockers + Valvular Problems/Murmurs AS  Rhythm:regular Rate:Normal  12/2021 ECHO: EF 70-75%, hyperdynamic LVF, mild LVH, normal RVF, mod AV calcification with mild thickening, mild-mod AI, moderated AS with mean gradient 10mHg   Neuro/Psych  Headaches, Anxiety S/p lumbar fusion, now infected:   9/13 showed soft tissue abscess extends anteriorly to fusion hardware L3-L2 L4 vertebral body.  The abscess extends into epidural space at L3 and L4 where there is epidural phlegmon resulting in severe canal stenosis    GI/Hepatic GERD  Medicated,  Endo/Other  diabetes (glu 123), Oral Hypoglycemic AgentsMorbid obesityBMI 39.9  Renal/GU      Musculoskeletal  (+) Arthritis ,   Abdominal   Peds  Hematology  (+) Blood dyscrasia (Hb 9.8), anemia ,   Anesthesia Other Findings   Reproductive/Obstetrics                            Anesthesia Physical Anesthesia Plan  ASA: 3  Anesthesia Plan: General   Post-op Pain Management: Tylenol PO (pre-op)*   Induction: Intravenous  PONV Risk Score and Plan: 3 and Ondansetron, Dexamethasone, Treatment may vary due to age or medical condition and Diphenhydramine  Airway Management Planned: Oral ETT  Additional Equipment: Arterial line  Intra-op Plan:   Post-operative Plan: Extubation in OR  Informed Consent: I have reviewed the patients History and Physical, chart, labs and  discussed the procedure including the risks, benefits and alternatives for the proposed anesthesia with the patient or authorized representative who has indicated his/her understanding and acceptance.     Dental advisory given  Plan Discussed with: CRNA, Anesthesiologist and Surgeon  Anesthesia Plan Comments:        Anesthesia Quick Evaluation

## 2022-02-06 DIAGNOSIS — M4316 Spondylolisthesis, lumbar region: Secondary | ICD-10-CM

## 2022-02-06 DIAGNOSIS — G062 Extradural and subdural abscess, unspecified: Secondary | ICD-10-CM

## 2022-02-06 DIAGNOSIS — D500 Iron deficiency anemia secondary to blood loss (chronic): Secondary | ICD-10-CM

## 2022-02-06 DIAGNOSIS — G9340 Encephalopathy, unspecified: Secondary | ICD-10-CM | POA: Diagnosis not present

## 2022-02-06 DIAGNOSIS — A419 Sepsis, unspecified organism: Secondary | ICD-10-CM | POA: Diagnosis not present

## 2022-02-06 LAB — BASIC METABOLIC PANEL
Anion gap: 14 (ref 5–15)
BUN: 12 mg/dL (ref 8–23)
CO2: 23 mmol/L (ref 22–32)
Calcium: 9.2 mg/dL (ref 8.9–10.3)
Chloride: 100 mmol/L (ref 98–111)
Creatinine, Ser: 0.91 mg/dL (ref 0.44–1.00)
GFR, Estimated: >60 mL/min (ref >60)
Glucose, Bld: 210 mg/dL — ABNORMAL HIGH (ref 70–99)
Potassium: 3.3 mmol/L — ABNORMAL LOW (ref 3.5–5.1)
Sodium: 137 mmol/L (ref 135–145)

## 2022-02-06 LAB — GLUCOSE, CAPILLARY
Glucose-Capillary: 173 mg/dL — ABNORMAL HIGH (ref 70–99)
Glucose-Capillary: 158 mg/dL — ABNORMAL HIGH (ref 70–99)
Glucose-Capillary: 120 mg/dL — ABNORMAL HIGH (ref 70–99)

## 2022-02-06 LAB — CBC
HCT: 28.7 % — ABNORMAL LOW (ref 36.0–46.0)
Hemoglobin: 9 g/dL — ABNORMAL LOW (ref 12.0–15.0)
MCH: 24.5 pg — ABNORMAL LOW (ref 26.0–34.0)
MCHC: 31.4 g/dL (ref 30.0–36.0)
MCV: 78.2 fL — ABNORMAL LOW (ref 80.0–100.0)
Platelets: 328 10*3/uL (ref 150–400)
RBC: 3.67 MIL/uL — ABNORMAL LOW (ref 3.87–5.11)
RDW: 15.9 % — ABNORMAL HIGH (ref 11.5–15.5)
WBC: 10 10*3/uL (ref 4.0–10.5)
nRBC: 0 % (ref 0.0–0.2)

## 2022-02-06 MED ORDER — HEPARIN SODIUM (PORCINE) 5000 UNIT/ML IJ SOLN
5000.0000 [IU] | Freq: Three times a day (TID) | INTRAMUSCULAR | Status: DC
  Administered 2022-02-06 – 2022-02-12 (×18): 5000 [IU] via SUBCUTANEOUS
  Filled 2022-02-06 (×19): qty 1

## 2022-02-06 MED ORDER — ACETAMINOPHEN 500 MG PO TABS
1000.0000 mg | ORAL_TABLET | Freq: Four times a day (QID) | ORAL | Status: DC
  Administered 2022-02-06 (×3): 1000 mg via ORAL
  Filled 2022-02-06 (×3): qty 2

## 2022-02-06 MED ORDER — SODIUM CHLORIDE 0.9 % IV SOLN: 250.0000 mL | INTRAVENOUS | Status: DC

## 2022-02-06 MED ORDER — ZOLPIDEM TARTRATE 5 MG PO TABS
5.0000 mg | ORAL_TABLET | Freq: Every evening | ORAL | Status: DC | PRN
  Administered 2022-02-07 – 2022-02-11 (×5): 5 mg via ORAL
  Filled 2022-02-06 (×5): qty 1

## 2022-02-06 MED ORDER — SENNA 8.6 MG PO TABS
1.0000 | ORAL_TABLET | Freq: Two times a day (BID) | ORAL | Status: DC
  Administered 2022-02-06 – 2022-02-12 (×14): 8.6 mg via ORAL
  Filled 2022-02-06 (×14): qty 1

## 2022-02-06 MED ORDER — DIAZEPAM 5 MG PO TABS
5.0000 mg | ORAL_TABLET | Freq: Four times a day (QID) | ORAL | Status: DC | PRN
  Administered 2022-02-06 – 2022-02-12 (×6): 5 mg via ORAL
  Filled 2022-02-06 (×6): qty 1

## 2022-02-06 MED ORDER — PHENOL 1.4 % MT LIQD: 1.0000 | OROMUCOSAL | Status: DC | PRN

## 2022-02-06 MED ORDER — ACETAMINOPHEN 500 MG PO TABS
1000.0000 mg | ORAL_TABLET | Freq: Three times a day (TID) | ORAL | Status: DC
  Administered 2022-02-06 – 2022-02-12 (×16): 1000 mg via ORAL
  Filled 2022-02-06 (×18): qty 2

## 2022-02-06 MED ORDER — ACETAMINOPHEN 650 MG RE SUPP: 650.0000 mg | RECTAL | Status: DC | PRN

## 2022-02-06 MED ORDER — SENNOSIDES-DOCUSATE SODIUM 8.6-50 MG PO TABS: 1.0000 | ORAL_TABLET | Freq: Every evening | ORAL | Status: DC | PRN

## 2022-02-06 MED ORDER — SODIUM CHLORIDE 0.9% FLUSH: 3.0000 mL | INTRAVENOUS | Status: DC | PRN

## 2022-02-06 MED ORDER — OXYCODONE HCL 5 MG PO TABS: 5.0000 mg | ORAL_TABLET | ORAL | Status: DC | PRN

## 2022-02-06 MED ORDER — DICLOFENAC SODIUM 1 % EX GEL
2.0000 g | Freq: Four times a day (QID) | CUTANEOUS | Status: DC
  Filled 2022-02-06: qty 100

## 2022-02-06 MED ORDER — LIDOCAINE 5 % EX PTCH
1.0000 | MEDICATED_PATCH | CUTANEOUS | Status: DC
  Administered 2022-02-06: 1 via TRANSDERMAL
  Filled 2022-02-06: qty 1

## 2022-02-06 MED ORDER — METHOCARBAMOL 500 MG PO TABS
500.0000 mg | ORAL_TABLET | Freq: Three times a day (TID) | ORAL | Status: DC
  Administered 2022-02-06: 500 mg via ORAL
  Filled 2022-02-06: qty 1

## 2022-02-06 MED ORDER — HYDROMORPHONE HCL 1 MG/ML IJ SOLN
1.0000 mg | Freq: Four times a day (QID) | INTRAMUSCULAR | Status: DC | PRN
  Administered 2022-02-06: 1 mg via INTRAVENOUS
  Filled 2022-02-06 (×2): qty 1

## 2022-02-06 MED ORDER — OXYCODONE HCL ER 15 MG PO T12A
15.0000 mg | EXTENDED_RELEASE_TABLET | Freq: Two times a day (BID) | ORAL | Status: DC
  Administered 2022-02-06 (×2): 15 mg via ORAL
  Filled 2022-02-06 (×2): qty 1

## 2022-02-06 MED ORDER — MORPHINE SULFATE (PF) 2 MG/ML IV SOLN
2.0000 mg | INTRAVENOUS | Status: DC | PRN
  Administered 2022-02-06 (×2): 2 mg via INTRAVENOUS
  Filled 2022-02-06 (×2): qty 1

## 2022-02-06 MED ORDER — BISACODYL 10 MG RE SUPP: 10.0000 mg | Freq: Every day | RECTAL | Status: DC | PRN

## 2022-02-06 MED ORDER — ACETAMINOPHEN 325 MG PO TABS: 650.0000 mg | ORAL_TABLET | ORAL | Status: DC | PRN

## 2022-02-06 MED ORDER — OXYCODONE HCL ER 10 MG PO T12A
20.0000 mg | EXTENDED_RELEASE_TABLET | Freq: Two times a day (BID) | ORAL | Status: DC
  Administered 2022-02-06 – 2022-02-12 (×12): 20 mg via ORAL
  Filled 2022-02-06 (×12): qty 2

## 2022-02-06 MED ORDER — MENTHOL 3 MG MT LOZG: 1.0000 | LOZENGE | OROMUCOSAL | Status: DC | PRN

## 2022-02-06 MED ORDER — POTASSIUM CHLORIDE IN NACL 20-0.9 MEQ/L-% IV SOLN
INTRAVENOUS | Status: DC
  Filled 2022-02-06: qty 1000

## 2022-02-06 MED ORDER — ONDANSETRON HCL 4 MG/2ML IJ SOLN: 4.0000 mg | Freq: Four times a day (QID) | INTRAMUSCULAR | Status: DC | PRN

## 2022-02-06 MED ORDER — MORPHINE SULFATE (PF) 2 MG/ML IV SOLN
2.0000 mg | INTRAVENOUS | Status: DC | PRN
  Administered 2022-02-06 – 2022-02-07 (×2): 2 mg via INTRAVENOUS
  Administered 2022-02-07 (×2): 4 mg via INTRAVENOUS
  Administered 2022-02-07 – 2022-02-09 (×3): 2 mg via INTRAVENOUS
  Administered 2022-02-09: 4 mg via INTRAVENOUS
  Administered 2022-02-10 – 2022-02-11 (×7): 2 mg via INTRAVENOUS
  Filled 2022-02-06 (×5): qty 1
  Filled 2022-02-06 (×2): qty 2
  Filled 2022-02-06: qty 1
  Filled 2022-02-06: qty 2
  Filled 2022-02-06 (×3): qty 1
  Filled 2022-02-06: qty 2
  Filled 2022-02-06: qty 1

## 2022-02-06 MED ORDER — METFORMIN HCL 500 MG PO TABS
1000.0000 mg | ORAL_TABLET | Freq: Two times a day (BID) | ORAL | Status: DC
  Administered 2022-02-06 – 2022-02-12 (×12): 1000 mg via ORAL
  Filled 2022-02-06 (×12): qty 2

## 2022-02-06 MED ORDER — POTASSIUM CHLORIDE CRYS ER 20 MEQ PO TBCR
40.0000 meq | EXTENDED_RELEASE_TABLET | Freq: Once | ORAL | Status: AC
  Administered 2022-02-06: 40 meq via ORAL
  Filled 2022-02-06: qty 2

## 2022-02-06 MED ORDER — OXYCODONE HCL 5 MG PO TABS
5.0000 mg | ORAL_TABLET | Freq: Four times a day (QID) | ORAL | Status: DC | PRN
  Administered 2022-02-06 – 2022-02-12 (×12): 5 mg via ORAL
  Filled 2022-02-06 (×14): qty 1

## 2022-02-06 MED ORDER — OXYCODONE HCL 5 MG PO TABS
10.0000 mg | ORAL_TABLET | ORAL | Status: DC | PRN
  Administered 2022-02-06: 10 mg via ORAL
  Filled 2022-02-06: qty 2

## 2022-02-06 MED ORDER — SODIUM CHLORIDE 0.9% FLUSH
3.0000 mL | Freq: Two times a day (BID) | INTRAVENOUS | Status: DC
  Administered 2022-02-06 (×2): 3 mL via INTRAVENOUS

## 2022-02-06 MED ORDER — ONDANSETRON HCL 4 MG PO TABS: 4.0000 mg | ORAL_TABLET | Freq: Four times a day (QID) | ORAL | Status: DC | PRN

## 2022-02-06 NOTE — Progress Notes (Signed)
NAME:  [name], MRN:  [id number], DOB:  March 03, 1956, LOS: 3 ADMISSION DATE:  02/03/2022  Subjective  NAEON  Interim history: Patient was sitting in a chair this morning. Patient notes feeling okay. Reports back soreness from the surgery and pain on her side similar to yesterday. Patient feels that the pain medication is helping. She was not sure why she was placed on nasal cannula last night as she denied SOB. She notes that PT helped her get in the chair and that it was difficult.  Objective   Blood pressure 138/72, pulse 94, temperature 98.3 F (36.8 C), temperature source Oral, resp. rate 14, height '4\' 10"'$  (1.473 m), weight 86.5 kg, SpO2 97 %.     Intake/Output Summary (Last 24 hours) at 02/06/2022 0544 Last data filed at 02/06/2022 0000 Gross per 24 hour  Intake 1700 ml  Output 645 ml  Net 1055 ml   Filed Weights   02/03/22 0620 02/04/22 0500 02/05/22 0500  Weight: 87.1 kg 84.6 kg 86.5 kg   Physical Exam: General: Pleasant in bed. No acute distress. CV: Grade 4/6 crescendo decrescendo murmur heard at the upper sternal border Pulmonary: Lungs CTAB. Normal effort. No wheezing or rales. Abdominal: Soft, nontender, nondistended. Normal bowel sounds. Extremities: Radial and DP pulses 2+ and symmetric. Weakness of right hip flexion. At least 3/5 left hip flexion. 5/5 strength of leg extension, foot extension/flexion bilaterally.  Skin: Warm and dry. Neuro: A&O. No focal deficit. Psych: Normal mood and affect   Labs       Latest Ref Rng & Units 02/06/2022    1:13 AM 02/05/2022    1:15 AM 02/04/2022    1:01 AM  CBC  WBC 4.0 - 10.5 K/uL 10.0  8.9  8.2   Hemoglobin 12.0 - 15.0 g/dL 9.0  9.8  9.5   Hematocrit 36.0 - 46.0 % 28.7  30.9  29.3   Platelets 150 - 400 K/uL 328  361  351       Latest Ref Rng & Units 02/06/2022    1:13 AM 02/05/2022    1:15 AM 02/04/2022    1:01 AM  BMP  Glucose 70 - 99 mg/dL 210  135  136   BUN 8 - 23 mg/dL '12  9  11   '$ Creatinine 0.44 -  1.00 mg/dL 0.91  0.89  0.92   Sodium 135 - 145 mmol/L 137  140  143   Potassium 3.5 - 5.1 mmol/L 3.3  3.7  3.6   Chloride 98 - 111 mmol/L 100  102  102   CO2 22 - 32 mmol/L '23  25  23   '$ Calcium 8.9 - 10.3 mg/dL 9.2  9.6  9.5     Blood culture 1/4 bottles: Proteus  Aerobic/anaerobic culture and gram stain from surgery shows no organisms, WBC present, culture is pending.   Summary  [name] is a 66 year old female with past medical history of hypertension, type 2 diabetes, asthma, hyperlipidemia, IDA, aortic stenosis, lumbar spondylolisthesis status post lumbar fusion and screw replacement who was brought to the ED due to altered mental status, admitted for sepsis secondary to Proteus bacteremia and spinal infection. POD #1 of hardware removal.   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 WBC remains WNL. Initial blood culture shows proteus, second blood culture shows no growth for 2 days although notably obtained while on antibiotics. Per ID recs, changed abx yesterday from cefepime to cefazolin. Aerobic/anaerobic culture and gram stain from surgery shows no organisms, WBC present, culture is pending. Plan for picc line placement for antibiotic treatment for 8 weeks. Potassium decreased: 3.7>3.3, repleting with 40 mEq KCl today. Pain is controlled at this time, transitioning her from IV  treatment to oral.  -Neurosurgery following, appreciate recs -PT/OT following, appreciate recs -Consult with ID, appreciate recs -Continue cefazoline 2 g IV at 200 mL/hr Q8H -Carb modified diet -Pain control with oral oxycodone 5 mg Q6H PRN, voltaren gel, lidocaine patch, tylenol 100 mg TID, robaxin 500 mg TID, IV dilaudid Q6H PRN,  -Morning BMP  Psoas fluid collection, c/f abscess Patient reporting pain on her right anterior thigh and right lower abdominal region, similar to yesterday. MRI showed small 1.1 x 0.8 cm fluid collection in the right psoas, musculature is suspicious for a psoas abscess. IR consulted and notes that it is too small for drainage so treatment will be continuing abx at this time and if abscess grows >3 cm, reassess for drainage. -Consult with ID, appreciate recs -Continue cefazoline 2 g IV at 200 mL/hr Q8H  Type 2 diabetes Diabetes well controlled with A1c of 6.2 3 weeks ago. CBG in the past 24 hours has ranged from 120s-150s. She only takes metformin at home.  -Restart metformin 100 mg BID with meals -CBG daily  Iron deficiency anemia Per notes, patient has IDA from inadequate intake. Patient is taking iron supplement at home. Iron panel shows iron 17, TIBC 241, and ferritin 212. Hgb trend: 9.8>9.0. MCV stable around 78. -Continue to monitor closely -Trend CBCs   Hypertension SBP ranging from 110s-180s, most recently in the 140s. Restarting amlodipine as patient is no longer hypotensive and SBP is increasing.  -Continue amlodipine 10 mg daily -Holding home Hyzaar       Best practice:  Full code Diet: carb modified IVF: LR DVT: SCD pending neurosurgery evaluation  Stormy Fabian, MD Internal Medicine Resident PGY-1 PAGER: [phone] 02/06/2022 5:44 AM  If after hours (below), please contact on-call pager: [phone] 5PM-7AM Monday-Friday 1PM-7AM Saturday-Sunday

## 2022-02-06 NOTE — Progress Notes (Signed)
Foley catheter removed per order, pt tolerated well. Pt due to void.

## 2022-02-06 NOTE — Progress Notes (Signed)
Pt reported to RN that her bed pad feels wet. When pt turned to right side bed pad noted to be dry. Pt touched her bottom and accidentally scratched her incision site over honeycomb dressing. Pt started bleeding at the incision site, and dressing got completely saturated. Pressure applied over the dressing, and few minutes later dressing removed. No signs of bleeding at the incision at that time. Sterile dressing placed over the incision, covered with abdominal pad and secured with tape.  MD on call notified.

## 2022-02-06 NOTE — Op Note (Signed)
02/05/2022  6:26 PM  PATIENT:  [name]  66 y.o. female Has bilateral pedicle fractures of L3, and anterior migration of both L3/4 interbody cages. She also had increased pain in the right lower extremity. I recommended that she undergo operative hardware revision.  PRE-OPERATIVE DIAGNOSIS:  Painful hardware  POST-OPERATIVE DIAGNOSIS:  Painful hardware  PROCEDURE:  Procedure(s): Removal L3 pedicle screws, removal L3/4 cages Placement L2 pedicle screws(nuvasive) Replacement S1 pedicle screws with 8.76m x 381mscrews x2 Segmental pedicle fixation L2-S1 Posterior arthrodesis L3 to S1   SURGEON:  Surgeon(s): CaAshok PallMD  ASSISTANTS:none  ANESTHESIA:   general  EBL:  Total I/O In: 897.7 [I.V.:794; IV Piggyback:103.7] Out: -   BLOOD ADMINISTERED:none  CELL SAVER GIVEN:none  COUNT:per nursing  DRAINS: none   SPECIMEN:  Source of Specimen:  L3 pedicles  DICTATION: [name] a 89.o. female whom was taken to the operating room intubated, and placed under a general anesthetic without difficulty. A foley catheter was placed under sterile conditions. She was positioned prone on a Jackson table with all pressure points properly padded.  Her  lumbar region was prepped and draped in a sterile manner. I infiltrated 10cc's 1/2%lidocaine/1:2000,000 strength epinephrine into the planned incision. I opened the skin with a 10 blade and took the incision down to the thoracolumbar fascia. I exposed the lamina of L2, and the L3/4 disc space and hardware in a subperiosteal fashion bilaterally. I confirmed my location with an intraoperative xray.  I placed self retaining retractors and started the revision.  I removed the locking caps and rods on each side. I removed the L3 pedicle screws which had no purchase in the fractured pedicles right and left sides. There also appeared to be frank pus but only around these screws. The rest of the operative field was clean, and without  signs of infection, nor was there any purulence noted around any other hardware. I was able to remove the interbody cages using rongeurs of various sizes and with fluoroscopic guidance. I did place bmp in the pedicles of L3, and the L3/4 disc space. I placed pedicle screws at L2, using fluoroscopic guidance. I drilled a pilot hole, then cannulated the pedicle with a drill at each site. I then tapped each pedicle, assessing each site for pedicle violations. No cutouts were appreciated. Screws (Nuvasive relign) were then placed at each site without difficulty. I replaced the S1 screws with 8.75m43m 375m49mrews on both sides. I attached rods and locking caps with the appropriate tools. The locking caps were secured with torque limited screwdrivers. Final films were performed and the final construct appeared to be in good position.  I closed the wound in a layered fashion. I approximated the thoracolumbar fascia, subcutaneous, and subcuticular planes with vicryl sutures. I used dermabond and an occlusive bandage for a sterile dressing.     PLAN OF CARE: Admit to inpatient   PATIENT DISPOSITION:  PACU - hemodynamically stable.   Delay start of Pharmacological VTE agent (>24hrs) due to surgical blood loss or risk of bleeding:  yes

## 2022-02-06 NOTE — Evaluation (Signed)
Occupational Therapy Evaluation Patient Details Name: [name] MRN: [id number] DOB: 11-02-1955 Today's Date: 02/06/2022   History of Present Illness The pt is a 66 yo female presenting 9/13 with AMS. S/p hardware revision 9/15. PMH includes: recent removal of hardware at L3, L4, and S1 with PLIF of L3-4 on 8/23, anxiety, aortic stenosis, obesity, DM II, HTN, and osteoarthritis of L hip.   Clinical Impression   Pt since last dc was able to mobilize with walker and Independent in ADLS but had husband's support as needed. Pt in today's session with medication given at the start of session was limited with pain down RLE. Pt requires mod x2 for bed mobility with increase in time and mod x2 for sit to stand transfers with FW and transfers to [facility] and chair. Pt currently with functional limitations due to the deficits listed below (see OT Problem List).  Pt will benefit from skilled OT to increase their safety and independence with ADL and functional mobility for ADL to facilitate discharge to venue listed below.        Recommendations for follow up therapy are one component of a multi-disciplinary discharge planning process, led by the attending physician.  Recommendations may be updated based on patient status, additional functional criteria and insurance authorization.   Follow Up Recommendations  Skilled nursing-short term rehab (<3 hours/day)    Assistance Recommended at Discharge Frequent or constant Supervision/Assistance  Patient can return home with the following A lot of help with walking and/or transfers;A lot of help with bathing/dressing/bathroom;Assistance with cooking/housework;Assistance with feeding;Assist for transportation    Functional Status Assessment  Patient has had a recent decline in their functional status and demonstrates the ability to make significant improvements in function in a reasonable and predictable amount of time.  Equipment Recommendations  None  recommended by OT    Recommendations for Other Services       Precautions / Restrictions Precautions Precautions: Back;Fall, HR Precaution Comments: reviewed back precautions Required Braces or Orthoses:  (no brace needed) Restrictions Weight Bearing Restrictions: No      Mobility Bed Mobility Overal bed mobility: Needs Assistance Bed Mobility: Rolling, Sidelying to Sit Rolling: Min assist Sidelying to sit: Mod assist, +2 for physical assistance, HOB elevated Supine to sit: Min guard   Sit to sidelying: Min assist General bed mobility comments: minA to complete roll with modA to complete trunk elevation with assist to manage lower of BLE    Transfers Overall transfer level: Needs assistance Equipment used: Rolling walker (2 wheels) Transfers: Sit to/from Stand, Bed to chair/wheelchair/BSC Sit to Stand: Mod assist, +2 physical assistance     Step pivot transfers: Mod assist, +2 physical assistance     General transfer comment: modA of 2 to stand, heavy dependence on LLE and BUE, limited to x2 stand-pivot transfers with modA of 2. limited by pain and HR elevation to 165bpm      Balance Overall balance assessment: Needs assistance Sitting-balance support: Feet supported Sitting balance-[name]: Good     Standing balance support: Bilateral upper extremity supported, During functional activity Standing balance-[name]: Poor Standing balance comment: dependent on BUE support                           ADL either performed or assessed with clinical judgement   ADL Overall ADL's : Needs assistance/impaired Eating/Feeding: Set up;Sitting   Grooming: Wash/dry hands;Min guard   Upper Body Bathing: Minimal assistance;Sitting   Lower  Body Bathing: Maximal assistance;Cueing for safety;Cueing for sequencing   Upper Body Dressing : Minimal assistance;Cueing for safety;Cueing for sequencing   Lower Body Dressing: Maximal assistance;+2 for physical  assistance;+2 for safety/equipment   Toilet Transfer: Moderate assistance;+2 for physical assistance;+2 for safety/equipment;Cueing for safety;Cueing for sequencing   Toileting- Clothing Manipulation and Hygiene: Maximal assistance;Cueing for safety;Cueing for sequencing;Sit to/from stand       Functional mobility during ADLs: Moderate assistance;+2 for physical assistance;+2 for safety/equipment;Rolling walker (2 wheels) General ADL Comments: very lmited side stepping to BSC/chair     Vision Baseline Vision/History: 0 No visual deficits Ability to See in Adequate Light: 0 Adequate Patient Visual Report: No change from baseline Vision Assessment?: No apparent visual deficits     Perception     Praxis      Pertinent Vitals/Pain Pain Assessment Pain Assessment: Faces Faces Pain Scale: Hurts worst Pain Location: back and R leg Pain Descriptors / Indicators: Discomfort, Grimacing, Guarding, Pins and needles Pain Intervention(s): Limited activity within patient's tolerance, Monitored during session, Premedicated before session, Repositioned, Patient requesting pain meds-RN notified     Hand Dominance Right   Extremity/Trunk Assessment Upper Extremity Assessment Upper Extremity Assessment: Generalized weakness (due to pain limited)   Lower Extremity Assessment Lower Extremity Assessment: Defer to PT evaluation RLE Deficits / Details: limited due to pain, unable to complete antigravity movement. reports more pain than sensation loss at this time RLE: Unable to fully assess due to pain RLE Coordination: decreased fine motor;decreased gross motor   Cervical / Trunk Assessment Cervical / Trunk Assessment: Back Surgery   Communication Communication Communication: No difficulties   Cognition Arousal/Alertness: Awake/alert Behavior During Therapy: WFL for tasks assessed/performed Overall Cognitive Status: Difficult to assess Area of Impairment: Memory, Attention                    Current Attention Level: Selective Memory: Decreased recall of precautions, Decreased short-term memory   Safety/Judgement: Decreased awareness of deficits Awareness: Intellectual Problem Solving: Requires verbal cues, Requires tactile cues General Comments: limited attention to task due to pain     General Comments  HR went up to 165 in session    Exercises     Shoulder Instructions      Home Living Family/patient expects to be discharged to:: Private residence Living Arrangements: Spouse/significant other;Other relatives Available Help at Discharge: Family;Available PRN/intermittently Type of Home: House Home Access: Level entry     Home Layout: Two level;Other (Comment) Alternate Level Stairs-Number of Steps: 13 Alternate Level Stairs-Rails: Left Bathroom Shower/Tub: Occupational psychologist: Standard     Home Equipment: Conservation officer, nature (2 wheels);Rollator (4 wheels);Cane - single point;Shower seat;BSC/3in1          Prior Functioning/Environment Prior Level of Function : Independent/Modified Independent;Driving             Mobility Comments: pt reports use of rollator at home since surgery ADLs Comments: pt reports independence with ADLs, husband assists with wound care, independent with meds and finances        OT Problem List: Decreased strength;Decreased coordination;Cardiopulmonary status limiting activity;Pain;Decreased activity tolerance;Decreased safety awareness;Obesity;Impaired balance (sitting and/or standing)      OT Treatment/Interventions: Self-care/ADL training;Therapeutic exercise;Neuromuscular education;Energy conservation;DME and/or AE instruction;Therapeutic activities;Patient/family education;Balance training    OT Goals(Current goals can be found in the care plan section) Acute Rehab OT Goals Patient Stated Goal: to have less pain OT Goal Formulation: With patient Time For Goal Achievement: 02/20/22 Potential to  Achieve Goals: Good  ADL Goals Pt Will Perform Grooming: with modified independence;sitting Pt Will Perform Upper Body Bathing: with set-up;sitting Pt Will Perform Lower Body Bathing: with min assist;sit to/from stand Pt Will Transfer to Toilet: with min assist;bedside commode;ambulating  OT Frequency: Min 2X/week    Co-evaluation PT/OT/SLP Co-Evaluation/Treatment: Yes Reason for Co-Treatment: Complexity of the patient's impairments (multi-system involvement) PT goals addressed during session: Mobility/safety with mobility;Balance;Proper use of DME;Strengthening/ROM OT goals addressed during session: ADL's and self-care      AM-PAC OT "6 Clicks" Daily Activity     Outcome Measure Help from another person eating meals?: A Little Help from another person taking care of personal grooming?: A Little Help from another person toileting, which includes using toliet, bedpan, or urinal?: Total Help from another person bathing (including washing, rinsing, drying)?: A Lot Help from another person to put on and taking off regular upper body clothing?: A Little Help from another person to put on and taking off regular lower body clothing?: A Lot 6 Click Score: 14   End of Session Equipment Utilized During Treatment: Gait belt;Rolling walker (2 wheels) Nurse Communication: Mobility status;Precautions  Activity Tolerance: Patient tolerated treatment well Patient left: in chair;with call bell/phone within reach;with chair alarm set  OT Visit Diagnosis: Unsteadiness on feet (R26.81) Pain - part of body:  (back)                Time: 8887-5797 OT Time Calculation (min): 30 min Charges:  OT General Charges $OT Visit: 1 Visit OT Evaluation $OT Eval Moderate Complexity: 1 Mod  Joeseph Amor OTR/L  Acute Rehab Services  [phone] office number [phone] pager number   Joeseph Amor 02/06/2022, 11:41 AM

## 2022-02-06 NOTE — Progress Notes (Signed)
NEUROSURGERY PROGRESS NOTE  Doing well. Complains of appropriate back soreness. No leg pain, patient sitting on bedside commode currently. Continue efforts in mobilization today.    Temp:  [97.4 F (36.3 C)-98.3 F (36.8 C)] 98.3 F (36.8 C) (09/16 0425) Pulse Rate:  [75-110] 94 (09/16 0425) Resp:  [12-23] 14 (09/16 0455) BP: (138-182)/(72-103) 138/72 (09/16 0425) SpO2:  [85 %-100 %] 97 % (09/16 0425) Weight:  [86.2 kg] 86.2 kg (09/16 0500)    Olivia Chiquito, NP 02/06/2022 8:39 AM

## 2022-02-06 NOTE — Progress Notes (Signed)
Pt arrived to [facility], from PACU. Pt alert and oriented x 4, VS stable, no signs of distress. Family at the bedside, all questions answered, pt instructed to call for assistance, and call light left within pt reach. Pt c/o pain at the surgical site, pain meds given. Will continue to monitor pt and treat per MD orders.

## 2022-02-06 NOTE — Evaluation (Signed)
Physical Therapy Evaluation Patient Details Name: [name] MRN: [id number] DOB: 03-28-1956 Today's Date: 02/06/2022  History of Present Illness  The pt is a 66 yo female presenting 9/13 with AMS. S/p hardware revision 9/15. PMH includes: recent removal of hardware at L3, L4, and S1 with PLIF of L3-4 on 8/23, anxiety, aortic stenosis, obesity, DM II, HTN, and osteoarthritis of L hip.   Clinical Impression  Pt in bed upon arrival of PT, agreeable to evaluation at this time. Prior to admission the pt was mobilizing with use of rollator, independent with ADLs other than her spouse performing wound care. The pt now presents with limitations in functional mobility, strength, power, stability, and activity tolerance due to above dx and resulting pain, and will continue to benefit from skilled PT to address these deficits. The pt required min-modA to complete bed mobility and modA of 2 for all OOB mobility at this time. Gait progression limited by pain in back and RLE as well as HR elevation to 165bpm with pivotal steps to [facility] and recliner. Given significant need for assistance, recommend SNF rehab to facilitate functional recovery and decreased caregiver assist prior to return home.         Recommendations for follow up therapy are one component of a multi-disciplinary discharge planning process, led by the attending physician.  Recommendations may be updated based on patient status, additional functional criteria and insurance authorization.  Follow Up Recommendations Skilled nursing-short term rehab (<3 hours/day) Can patient physically be transported by private vehicle: No    Assistance Recommended at Discharge Frequent or constant Supervision/Assistance  Patient can return home with the following  Assistance with cooking/housework;Assist for transportation;Help with stairs or ramp for entrance;Two people to help with walking and/or transfers;A lot of help with bathing/dressing/bathroom     Equipment Recommendations None recommended by PT  Recommendations for Other Services       Functional Status Assessment Patient has had a recent decline in their functional status and demonstrates the ability to make significant improvements in function in a reasonable and predictable amount of time.     Precautions / Restrictions Precautions Precautions: Back;Fall Precaution Comments: (P) reviewed back precautions Required Braces or Orthoses:  (no brace needed) Restrictions Weight Bearing Restrictions: (P) No      Mobility  Bed Mobility Overal bed mobility: Needs Assistance Bed Mobility: Rolling, Sidelying to Sit Rolling: Min assist Sidelying to sit: Mod assist, +2 for physical assistance, HOB elevated       General bed mobility comments: minA to complete roll with modA to complete trunk elevation with assist to manage lower of BLE    Transfers Overall transfer level: Needs assistance Equipment used: Rolling walker (2 wheels) Transfers: Sit to/from Stand, Bed to chair/wheelchair/BSC Sit to Stand: Mod assist, +2 physical assistance   Step pivot transfers: Mod assist, +2 physical assistance       General transfer comment: modA of 2 to stand, heavy dependence on LLE and BUE, limited to x2 stand-pivot transfers with modA of 2. limited by pain and HR elevation to 165bpm    Ambulation/Gait               General Gait Details: limited to x2 stand-pivot transfers with modA of 2. limited by pain and HR elevation to 165bpm     Balance Overall balance assessment: Needs assistance Sitting-balance support: Feet supported Sitting balance-[name]: Good     Standing balance support: Bilateral upper extremity supported, During functional activity Standing balance-[name]: Poor Standing  balance comment: dependent on BUE support                             Pertinent Vitals/Pain Pain Assessment Pain Assessment: 0-10 Pain Score: 8  Faces Pain Scale:  Hurts worst Pain Location: back and R leg Pain Descriptors / Indicators: Discomfort, Grimacing, Guarding, Pins and needles Pain Intervention(s): Limited activity within patient's tolerance, Monitored during session, Repositioned, Premedicated before session    Home Living Family/patient expects to be discharged to:: (P) Private residence Living Arrangements: (P) Spouse/significant other;Other relatives Available Help at Discharge: (P) Family;Available PRN/intermittently Type of Home: (P) House Home Access: (P) Level entry     Alternate Level Stairs-Number of Steps: (P) 13 Home Layout: (P) Two level;Other (Comment) Home Equipment: (P) Rolling Walker (2 wheels);Rollator (4 wheels);Cane - single point;Shower seat;BSC/3in1      Prior Function Prior Level of Function : Independent/Modified Independent;Driving             Mobility Comments: pt reports use of rollator at home since surgery ADLs Comments: pt reports independence with ADLs, husband assists with wound care, independent with meds and finances     Hand Dominance   Dominant Hand: Right    Extremity/Trunk Assessment   Upper Extremity Assessment Upper Extremity Assessment: Defer to OT evaluation    Lower Extremity Assessment Lower Extremity Assessment: Generalized weakness;RLE deficits/detail RLE Deficits / Details: limited due to pain, unable to complete antigravity movement. reports more pain than sensation loss at this time RLE: Unable to fully assess due to pain RLE Coordination: decreased fine motor;decreased gross motor    Cervical / Trunk Assessment Cervical / Trunk Assessment: Back Surgery  Communication   Communication: No difficulties  Cognition Arousal/Alertness: Awake/alert Behavior During Therapy: WFL for tasks assessed/performed Overall Cognitive Status: Difficult to assess                                 General Comments: limited attention to task due to pain        General  Comments General comments (skin integrity, edema, etc.): HR to 165bpm with stand-pivot     PT Problem List Decreased strength;Decreased activity tolerance;Decreased balance;Decreased mobility;Decreased knowledge of precautions;Obesity;Pain       PT Treatment Interventions DME instruction;Gait training;Stair training;Functional mobility training;Therapeutic activities;Therapeutic exercise;Balance training;Patient/family education;Modalities    PT Goals (Current goals can be found in the Care Plan section)  Acute Rehab PT Goals Patient Stated Goal: get stronger for home PT Goal Formulation: With patient Time For Goal Achievement: 02/20/22 Potential to Achieve Goals: Good    Frequency Min 5X/week     Co-evaluation PT/OT/SLP Co-Evaluation/Treatment: Yes Reason for Co-Treatment: For patient/therapist safety;To address functional/ADL transfers PT goals addressed during session: Mobility/safety with mobility;Balance;Proper use of DME;Strengthening/ROM         AM-PAC PT "6 Clicks" Mobility  Outcome Measure Help needed turning from your back to your side while in a flat bed without using bedrails?: A Little Help needed moving from lying on your back to sitting on the side of a flat bed without using bedrails?: A Lot Help needed moving to and from a bed to a chair (including a wheelchair)?: A Lot Help needed standing up from a chair using your arms (e.g., wheelchair or bedside chair)?: Total Help needed to walk in hospital room?: Total Help needed climbing 3-5 steps with a railing? : Total 6 Click Score: 10  End of Session Equipment Utilized During Treatment: Gait belt Activity Tolerance: Patient limited by fatigue;Treatment limited secondary to medical complications (Comment) Patient left: in chair;with chair alarm set;with call bell/phone within reach Nurse Communication: Mobility status PT Visit Diagnosis: Unsteadiness on feet (R26.81);Other abnormalities of gait and mobility  (R26.89);Muscle weakness (generalized) (M62.81)    Time: 5732-2025 PT Time Calculation (min) (ACUTE ONLY): 32 min   Charges:   PT Evaluation $PT Eval Moderate Complexity: 1 Mod          [place], PT, DPT   Acute Rehabilitation Department  Sandra Cockayne 02/06/2022, 11:33 AM

## 2022-02-07 ENCOUNTER — Inpatient Hospital Stay: Payer: Self-pay

## 2022-02-07 DIAGNOSIS — M4316 Spondylolisthesis, lumbar region: Secondary | ICD-10-CM | POA: Diagnosis not present

## 2022-02-07 DIAGNOSIS — G062 Extradural and subdural abscess, unspecified: Secondary | ICD-10-CM | POA: Diagnosis not present

## 2022-02-07 DIAGNOSIS — A419 Sepsis, unspecified organism: Secondary | ICD-10-CM | POA: Diagnosis not present

## 2022-02-07 DIAGNOSIS — G9340 Encephalopathy, unspecified: Secondary | ICD-10-CM | POA: Diagnosis not present

## 2022-02-07 LAB — CBC
HCT: 24.6 % — ABNORMAL LOW (ref 36.0–46.0)
Hemoglobin: 8 g/dL — ABNORMAL LOW (ref 12.0–15.0)
MCH: 24.9 pg — ABNORMAL LOW (ref 26.0–34.0)
MCHC: 32.5 g/dL (ref 30.0–36.0)
MCV: 76.6 fL — ABNORMAL LOW (ref 80.0–100.0)
Platelets: 337 10*3/uL (ref 150–400)
RBC: 3.21 MIL/uL — ABNORMAL LOW (ref 3.87–5.11)
RDW: 16 % — ABNORMAL HIGH (ref 11.5–15.5)
WBC: 9.7 10*3/uL (ref 4.0–10.5)
nRBC: 0 % (ref 0.0–0.2)

## 2022-02-07 LAB — BASIC METABOLIC PANEL
Anion gap: 9 (ref 5–15)
BUN: 12 mg/dL (ref 8–23)
CO2: 25 mmol/L (ref 22–32)
Calcium: 9 mg/dL (ref 8.9–10.3)
Chloride: 106 mmol/L (ref 98–111)
Creatinine, Ser: 0.79 mg/dL (ref 0.44–1.00)
GFR, Estimated: >60 mL/min (ref >60)
Glucose, Bld: 137 mg/dL — ABNORMAL HIGH (ref 70–99)
Potassium: 3.6 mmol/L (ref 3.5–5.1)
Sodium: 140 mmol/L (ref 135–145)

## 2022-02-07 LAB — GLUCOSE, CAPILLARY
Glucose-Capillary: 135 mg/dL — ABNORMAL HIGH (ref 70–99)
Glucose-Capillary: 108 mg/dL — ABNORMAL HIGH (ref 70–99)

## 2022-02-07 MED ORDER — HYDROCHLOROTHIAZIDE 25 MG PO TABS
25.0000 mg | ORAL_TABLET | Freq: Every day | ORAL | Status: DC
  Administered 2022-02-07 – 2022-02-12 (×6): 25 mg via ORAL
  Filled 2022-02-07 (×6): qty 1

## 2022-02-07 MED ORDER — LOSARTAN POTASSIUM 50 MG PO TABS
100.0000 mg | ORAL_TABLET | Freq: Every day | ORAL | Status: DC
  Administered 2022-02-07 – 2022-02-12 (×6): 100 mg via ORAL
  Filled 2022-02-07 (×6): qty 2

## 2022-02-07 MED ORDER — METOPROLOL SUCCINATE ER 50 MG PO TB24
100.0000 mg | ORAL_TABLET | Freq: Every day | ORAL | Status: DC
  Administered 2022-02-07 – 2022-02-11 (×5): 100 mg via ORAL
  Filled 2022-02-07 (×5): qty 2

## 2022-02-07 MED ORDER — GABAPENTIN 300 MG PO CAPS
300.0000 mg | ORAL_CAPSULE | Freq: Three times a day (TID) | ORAL | Status: DC
  Administered 2022-02-07 – 2022-02-12 (×16): 300 mg via ORAL
  Filled 2022-02-07 (×17): qty 1

## 2022-02-07 MED ORDER — LOSARTAN POTASSIUM-HCTZ 100-25 MG PO TABS: 1.0000 | ORAL_TABLET | Freq: Every day | ORAL | Status: DC

## 2022-02-07 MED ORDER — POLYETHYLENE GLYCOL 3350 17 G PO PACK
17.0000 g | PACK | Freq: Once | ORAL | Status: AC
  Administered 2022-02-07: 17 g via ORAL
  Filled 2022-02-07: qty 1

## 2022-02-07 NOTE — Anesthesia Postprocedure Evaluation (Signed)
Anesthesia Post Note  Patient: Phelicia M Swallows  Procedure(s) Performed: Removal of Lumbar Two-Three Interbody Cages, Screws and Rods with Placement  of Sacral Screws, Pedicle Screw Arthrodesis Lumbar Two-Lumbar Four, Pedicle Screw Fixation from Lumbar Two-Sacral One (Spine Lumbar)     Patient location during evaluation: PACU Anesthesia Type: General Level of consciousness: awake and alert Pain management: pain level controlled Vital Signs Assessment: post-procedure vital signs reviewed and stable Respiratory status: spontaneous breathing, nonlabored ventilation, respiratory function stable and patient connected to nasal cannula oxygen Cardiovascular status: blood pressure returned to baseline and stable Postop Assessment: no apparent nausea or vomiting Anesthetic complications: no   No notable events documented.  Last Vitals:  Vitals:   02/06/22 2205 02/07/22 0436  BP:  (!) 149/68  Pulse: (!) 104 94  Resp: 19 13  Temp:  36.7 C  SpO2: 97% 99%    Last Pain:  Vitals:   02/07/22 0638  TempSrc:   PainSc: [place]

## 2022-02-07 NOTE — Progress Notes (Addendum)
At bedside to place PICC line.  Pt husband and other family members at bedside.  Pt declined PICC placement tonight "I just got settled down" "I have had a lot of pain today"  Pt and husband state prefer to have PICC line placed on day of discharge home.  Current PIV working well.

## 2022-02-07 NOTE — NC FL2 (Signed)
[place] LEVEL OF CARE SCREENING TOOL     IDENTIFICATION  Patient Name: [name] Birthdate: Oct 07, 1955 Sex: female Admission Date (Current Location): 02/03/2022  [facility] and [name] Number:  Herbalist and Address:  The 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 ([place]) 02/05/2022   Bacteremia due to Proteus species    Sepsis ([place]) 02/03/2022   Iron deficiency anemia 02/03/2022   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   S/P lumbar spinal fusion 11/17/2021   Lumbar radiculopathy 04/21/2021   Spondylolisthesis of lumbar region 02/01/2018   Gastroesophageal reflux disease without esophagitis 01/16/2018   Osteoarthritis of left hip 04/07/2014   BMI 40.0-44.9, adult ([place]) 04/07/2014   DM type 2 (diabetes mellitus, type 2) (HCC)    HTN (hypertension)    Hypercholesterolemia    Reflux     Orientation RESPIRATION BLADDER Height & Weight     Self, Time, Situation, Place  Normal Continent Weight: 188 lb 7.9 oz (85.5 kg) Height:  '4\' 10"'$  (147.3 cm)  BEHAVIORAL SYMPTOMS/MOOD NEUROLOGICAL BOWEL NUTRITION STATUS      Continent Diet (see discharge summary)  AMBULATORY STATUS COMMUNICATION OF NEEDS Skin   Limited Assist Verbally Other (Comment) (back incision)                       Personal Care Assistance Level of Assistance  Bathing, Dressing     Dressing Assistance: Limited assistance     Functional Limitations Info             SPECIAL CARE  FACTORS FREQUENCY  PT (By licensed PT), OT (By licensed OT)     PT Frequency: 4-5x/wk OT Frequency: 4-5x/wk            Contractures      Additional Factors Info  Allergies, Code Status Code Status Info: FULL Allergies Info: Methocarbamol, Simvastatin, Sulfa ABX, Rosuvastatin, Atorvastatin           Current Medications (02/07/2022):  This is the current hospital active medication list Current Facility-Administered Medications  Medication Dose Route Frequency Provider Last Rate Last Admin   0.9 % NaCl with KCl 20 mEq/ L  infusion   Intravenous Continuous Ashok Pall, MD 80 mL/hr at 02/06/22 0218 New Bag at 02/06/22 0218   acetaminophen (TYLENOL) tablet 1,000 mg  1,000 mg Oral TID Stormy Fabian, MD   1,000 mg at 02/07/22 0850   albuterol (PROVENTIL) (2.5 MG/3ML) 0.083% nebulizer solution 3 mL  3 mL Inhalation Q4H PRN Ashok Pall, MD       amLODipine (NORVASC) tablet 10 mg  10 mg Oral Daily Ashok Pall, MD   10 mg at 02/07/22 0850   bisacodyl (DULCOLAX) suppository 10 mg  10 mg Rectal Daily PRN Ashok Pall, MD       ceFAZolin (ANCEF) IVPB 2g/100 mL premix  2 g Intravenous Q8H Ashok Pall, MD 200 mL/hr at 02/07/22 0618 2 g at 02/07/22 0618   diazepam (VALIUM) tablet 5  mg  5 mg Oral Q6H PRN Ashok Pall, MD   5 mg at 02/07/22 1638   gabapentin (NEURONTIN) capsule 300 mg  300 mg Oral TID [name], MD   300 mg at 02/07/22 1015   heparin injection 5,000 Units  5,000 Units Subcutaneous Q8H Ashok Pall, MD   5,000 Units at 02/07/22 1319   losartan (COZAAR) tablet 100 mg  100 mg Oral Daily Ashok Pall, MD   100 mg at 02/07/22 4665   And   hydrochlorothiazide (HYDRODIURIL) tablet 25 mg  25 mg Oral Daily Ashok Pall, MD   25 mg at 02/07/22 0854   menthol-cetylpyridinium (CEPACOL) lozenge 3 mg  1 lozenge Oral PRN Ashok Pall, MD       Or   phenol (CHLORASEPTIC) mouth spray 1 spray  1 spray Mouth/Throat PRN Ashok Pall, MD       metFORMIN (GLUCOPHAGE) tablet 1,000  mg  1,000 mg Oral BID WC Stormy Fabian, MD   1,000 mg at 02/07/22 0850   metoprolol succinate (TOPROL-XL) 24 hr tablet 100 mg  100 mg Oral QHS Lacinda Axon, MD       morphine (PF) 2 MG/ML injection 2-4 mg  2-4 mg Intravenous Q1H PRN Ashok Pall, MD   4 mg at 02/07/22 1316   oxyCODONE (Oxy IR/ROXICODONE) immediate release tablet 5 mg  5 mg Oral Q6H PRN Stormy Fabian, MD   5 mg at 02/07/22 0849   oxyCODONE (OXYCONTIN) 12 hr tablet 20 mg  20 mg Oral Q12H Ashok Pall, MD   20 mg at 02/07/22 0849   polyethylene glycol (MIRALAX / GLYCOLAX) packet 17 g  17 g Oral Once Lacinda Axon, MD       senna (SENOKOT) tablet 8.6 mg  1 tablet Oral BID Ashok Pall, MD   8.6 mg at 02/07/22 0850   senna-docusate (Senokot-S) tablet 1 tablet  1 tablet Oral QHS PRN Ashok Pall, MD       zolpidem (AMBIEN) tablet 5 mg  5 mg Oral QHS PRN Ashok Pall, MD         Discharge Medications: Please see discharge summary for a list of discharge medications.  Relevant Imaging Results:  Relevant Lab Results:   Additional Information    [place], LCSWA

## 2022-02-07 NOTE — Progress Notes (Signed)
Subjective:   Hospital day: 4  Overnight event: No acute events overnight.  Interim History: Patient evaluated at the bedside laying slightly uncomfortable in bed. Patient reports that she was unable to sleep very well last night due to the pain. She continues to endorse back pain as well as right lower extremity pain.  States the current pain medicine has not given her much relief.  She denies any fevers, chills, nausea, vomiting or shortness of breath. States she has not had a bowel movement for the past 2 to 3 days.  States she was slowly able to move from the bed to the chair with PT yesterday.  Objective:  Vital signs in last 24 hours: Vitals:   02/06/22 2205 02/07/22 0435 02/07/22 0436 02/07/22 0820  BP:   (!) 149/68 (!) 145/67  Pulse: (!) 104  94 (!) 114  Resp: '19  13 14  '$ Temp:   98 F (36.7 C) 98.1 F (36.7 C)  TempSrc:   Oral Oral  SpO2: 97%  99% 98%  Weight:  85.5 kg    Height:        Filed Weights   02/05/22 0500 02/06/22 0500 02/07/22 0435  Weight: 86.5 kg 86.2 kg 85.5 kg     Intake/Output Summary (Last 24 hours) at 02/07/2022 1142 Last data filed at 02/07/2022 0900 Gross per 24 hour  Intake 897.71 ml  Output 700 ml  Net 197.71 ml   Net IO Since Admission: 5,367.95 mL [02/07/22 1142]  Recent Labs    02/06/22 1155 02/06/22 1645 02/07/22 0440  GLUCAP 158* 120* 135*     Pertinent Labs:    Latest Ref Rng & Units 02/07/2022   12:39 AM 02/06/2022    1:13 AM 02/05/2022    1:15 AM  CBC  WBC 4.0 - 10.5 K/uL 9.7  10.0  8.9   Hemoglobin 12.0 - 15.0 g/dL 8.0  9.0  9.8   Hematocrit 36.0 - 46.0 % 24.6  28.7  30.9   Platelets 150 - 400 K/uL 337  328  361        Latest Ref Rng & Units 02/07/2022   12:39 AM 02/06/2022    1:13 AM 02/05/2022    1:15 AM  CMP  Glucose 70 - 99 mg/dL 137  210  135   BUN 8 - 23 mg/dL '12  12  9   '$ Creatinine 0.44 - 1.00 mg/dL 0.79  0.91  0.89   Sodium 135 - 145 mmol/L 140  137  140   Potassium 3.5 - 5.1 mmol/L 3.6  3.3  3.7    Chloride 98 - 111 mmol/L 106  100  102   CO2 22 - 32 mmol/L '25  23  25   '$ Calcium 8.9 - 10.3 mg/dL 9.0  9.2  9.6     Imaging: [place] EKG SITE RITE  Result Date: 02/07/2022 If Site Rite image not attached, placement could not be confirmed due to current cardiac rhythm.   Physical Exam  General: Pleasant, well-appearing middle-age woman laying in bed.  In mild distress due to pain. CV: Tachycardic. Regular rhythm. IV/VI crescendo decrescendo murmur. No LE edema Pulmonary: Lungs CTAB. Normal effort. No wheezing or rales. Abdominal: Soft, nontender, nondistended. Hypoactive bowel sounds. Extremities: Radial and DP pulses 2+ and symmetric. Mild ttp around the right thigh and knee. Skin: Warm and dry. No obvious rash or lesions. Neuro: A&Ox3. Moves all extremities. Normal sensation to gross touch.  Psych: Normal mood and affect   Assessment/Plan:  [name] MARISELA LINE is a 66 y.o. female with hx of hypertension, type 2 diabetes, asthma, hyperlipidemia, IDA, aortic stenosis, lumbar spondylolisthesis status post lumbar fusion and screw replacement who was brought to the ED due to altered mental status, admitted for sepsis secondary to proteus bacteremia and spinal infection. POD #2 of hardware removal.   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 She is POD #2 of hardware removal. Second blood culture still no growth after 4 days. She continues to be  afebrile with normal white count but continues to be in significant pain. Surgical cultures are still pending but Gram stain shows no organisms. She remains hemodynamically stable. We will continue on pain management with plan for long course of antibiotics per ID.  PT/OT evaluated patient and recommending SNF placement for rehab.  -Neurosurgery following, appreciate recs -Infectious disease following, appreciate recs -Continue cefazolin, PICC line order today for 8 weeks of IV antibiotics -Continue scheduled OxyContin 20 mg q 12 hrs -Continue oxycodone 5 mg q6h prn moderate pain -Continue morphine 2 to 4 mg IV, q1h for severe pain -Continue Valium 5 mg q6h prn for muscle spasms -Start gabapentin 300 mg 3 times daily -Continue bowel regimen with Dulcolax, senna, Senokot-S -Start MiraLAX daily   Psoas fluid collection, c/f abscess MRI on admission showed small 1.1 x 0.8 cm fluid collection in the right psoas, musculature is suspicious for a psoas abscess. This was too small for IR to drain. Would likely require repeat imaging after long-term IV antibiotics management. -Continue antibiotics as above -Continue cefazoline 2 g IV at 200 mL/hr Q8H   Type 2 diabetes Diabetes well controlled with A1c of 6.2 3 weeks ago. CBGs have been stable in the 120s to 150s. -Continue metformin 100 mg BID with meals -CBG daily  Blood loss anemia Iron deficiency anemia Per notes, patient has IDA from inadequate intake. Patient is taking iron supplement at home. Iron panel shows iron 17, TIBC 241, and ferritin 212.  One-point drop in hemoglobin from 9-8 today likely secondary to blood loss from recent surgery.  No evidence of active bleed. Continue to monitor closely. -Trend CBCs   Hypertension Blood pressure elevated overnight with SBP in the 140s to 150s. Kidney function remained stable so we will resume her home Hyzaar and metoprolol and monitor closely. -Continue amlodipine 10 mg daily -Resume Hyzaar  100-25 mg daily -Resume metoprolol 100 mg at bedtime  Diet: CM IVF: None VTE: Heparin CODE: Full  Prior to Admission Living Arrangement: Home Anticipated Discharge Location: SNF Barriers to Discharge: Pain control and SNF placement Dispo: Anticipated discharge in approximately 2-3 day(s).   Signed: Lacinda Axon, MD 02/07/2022, 11:42 AM  Pager: [phone] Internal Medicine Teaching Service After 5pm on weekdays and 1pm on weekends: On Call pager: [phone]

## 2022-02-07 NOTE — TOC Initial Note (Signed)
Transition of Care [facility]) - Initial/Assessment Note    Patient Details  Name: DALINDA HEIDT MRN: [id number] Date of Birth: 1956-01-04  Transition of Care [facility]) CM/SW Contact:    Ina Homes, [place] Phone Number: 02/07/2022, 3:07 PM  Clinical Narrative:                  SW met with pt at bedside. Pt confirmed demographics. Pt reports iADL and has BSC, r/w and rollator. Pt lives with husband. SW discussed PT recs for SNF. Pt hesitant, requests to f/u closer to d/c. Pt agreeable to send out to local SNF's. SW left MCR list at bedside for review.    Expected Discharge Plan: Skilled Nursing Facility Barriers to Discharge: Continued Medical Work up, SNF Pending bed offer, Insurance Authorization   Patient Goals and CMS Choice Patient states their goals for this hospitalization and ongoing recovery are:: return home CMS Medicare.gov Compare Post Acute Care list provided to:: Patient Choice offered to / list presented to : Patient  Expected Discharge Plan and Services Expected Discharge Plan: [place]       Living arrangements for the past 2 months: Single Family Home                                      Prior Living Arrangements/Services Living arrangements for the past 2 months: Single Family Home Lives with:: Spouse Patient language and need for interpreter reviewed:: Yes Do you feel safe going back to the place where you live?: Yes      Need for Family Participation in Patient Care: Yes (Comment) Care giver support system in place?: Yes (comment) Current home services: DME, Other (comment) (BSC, r/w, rollator) Criminal Activity/Legal Involvement Pertinent to Current Situation/Hospitalization: No - Comment as needed  Activities of Daily Living Home Assistive Devices/Equipment: Environmental consultant (specify type), Shower chair without back ADL Screening (condition at time of admission) Patient's cognitive ability adequate to safely complete daily activities?:  Yes Is the patient deaf or have difficulty hearing?: No Does the patient have difficulty seeing, even when wearing glasses/contacts?: No Does the patient have difficulty concentrating, remembering, or making decisions?: No Patient able to express need for assistance with ADLs?: Yes Does the patient have difficulty dressing or bathing?: No Independently performs ADLs?: Yes (appropriate for developmental age) Does the patient have difficulty walking or climbing stairs?: Yes Weakness of Legs: None Weakness of Arms/Hands: None  Permission Sought/Granted   Permission granted to share information with : Yes, Verbal Permission Granted              Emotional Assessment Appearance:: Appears stated age Attitude/Demeanor/Rapport: Engaged Affect (typically observed): Accepting Orientation: : Oriented to Self, Oriented to Place, Oriented to  Time, Oriented to Situation   Psych Involvement: No (comment)  Admission diagnosis:  Confusion [R41.0] SIRS (systemic inflammatory response syndrome) (HCC) [R65.10] Sepsis ([place]) [A41.9] Low back pain, unspecified back pain laterality, unspecified chronicity, unspecified whether sciatica present [M54.50] 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 ([place]) [W23.762G] Patient Active Problem List   Diagnosis Date Noted   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 ([place]) 02/05/2022   Bacteremia due to Proteus species    Sepsis ([place]) 02/03/2022   Iron deficiency anemia 02/03/2022   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   S/P lumbar spinal fusion 11/17/2021   Lumbar radiculopathy 04/21/2021   Spondylolisthesis of lumbar region 02/01/2018   Gastroesophageal  reflux disease without esophagitis 01/16/2018   Osteoarthritis of left hip 04/07/2014   BMI 40.0-44.9, adult ([place]) 04/07/2014   DM type 2 (diabetes mellitus, type 2) ([place])    HTN (hypertension)    Hypercholesterolemia    Reflux    PCP:  Chesley Noon, MD Pharmacy:    [facility] [address] [place] [address] Phone: [phone] Fax: [phone]  [facility] [place] [place] [address] Phone: [phone] Fax: [phone]  [facility] [place], [place] - [address] [place] [address] Phone: [phone] Fax: [phone]     Social Determinants of Health (SDOH) Interventions    Readmission Risk Interventions     No data to display

## 2022-02-07 NOTE — Progress Notes (Signed)
Patient ID: [name], female   DOB: 03-30-1956, 66 y.o.   MRN: [id number] Subjective: Patient reports some back soreness, some R upper thigh pain with "itching."  Objective: Vital signs in last 24 hours: Temp:  [98 F (36.7 C)] 98 F (36.7 C) (09/17 0436) Pulse Rate:  [94-105] 94 (09/17 0436) Resp:  [13-19] 13 (09/17 0436) BP: (149-151)/(66-68) 149/68 (09/17 0436) SpO2:  [97 %-99 %] 99 % (09/17 0436) Weight:  [85.5 kg] 85.5 kg (09/17 0435)  Intake/Output from previous day: 09/16 0701 - 09/17 0700 In: 897.7 [I.V.:794; IV Piggyback:103.7] Out: -  Intake/Output this shift: No intake/output data recorded.  Neurologic: Grossly normal  Lab Results: Lab Results  Component Value Date   WBC 9.7 02/07/2022   HGB 8.0 (L) 02/07/2022   HCT 24.6 (L) 02/07/2022   MCV 76.6 (L) 02/07/2022   PLT 337 02/07/2022   Lab Results  Component Value Date   INR 1.1 02/03/2022   BMET Lab Results  Component Value Date   NA 140 02/07/2022   K 3.6 02/07/2022   CL 106 02/07/2022   CO2 25 02/07/2022   GLUCOSE 137 (H) 02/07/2022   BUN 12 02/07/2022   CREATININE 0.79 02/07/2022   CALCIUM 9.0 02/07/2022    Studies/Results: [place] EKG SITE RITE  Result Date: 02/07/2022 If Site Rite image not attached, placement could not be confirmed due to current cardiac rhythm.  DG Lumbar Spine 2-3 Views  Result Date: 02/05/2022 CLINICAL DATA:  Lumbar revision EXAM: LUMBAR SPINE - 2-3 VIEW COMPARISON:  MRI 02/03/2022, CT 02/03/2022 FINDINGS: Four low resolution intraoperative spot views of the lumbar spine. Total fluoroscopy time was 1 minutes 55 seconds, fluoroscopic dose of 107.57 mGy. Images demonstrate posterior fusion hardware at L4-L5 and S1 with interbody devices at L4-L5 and L5-S1. Removal of interbody device at L3-L4 and pedicular screws at L3. Placement of pedicular screws at L2. IMPRESSION: Intraoperative fluoroscopic assistance provided during lumbar spine surgery Electronically Signed   By: Donavan Foil M.D.   On: 02/05/2022 22:29   DG C-Arm 1-60 Min-No Report  Result Date: 02/05/2022 Fluoroscopy was utilized by the requesting physician.  No radiographic interpretation.   DG C-Arm 1-60 Min-No Report  Result Date: 02/05/2022 Fluoroscopy was utilized by the requesting physician.  No radiographic interpretation.    Assessment/Plan: Likely making the expected recovery thus far. Add neurontin for leg pain. Mobilize as tolerated  Estimated body mass index is 39.4 kg/m as calculated from the following:   Height as of this encounter: '4\' 10"'$  (1.473 m).   Weight as of this encounter: 85.5 kg.    LOS: 4 days    [name] 02/07/2022, 8:51 AM

## 2022-02-08 DIAGNOSIS — R6520 Severe sepsis without septic shock: Secondary | ICD-10-CM | POA: Diagnosis not present

## 2022-02-08 DIAGNOSIS — M4316 Spondylolisthesis, lumbar region: Secondary | ICD-10-CM | POA: Diagnosis not present

## 2022-02-08 DIAGNOSIS — A419 Sepsis, unspecified organism: Secondary | ICD-10-CM | POA: Diagnosis not present

## 2022-02-08 DIAGNOSIS — D500 Iron deficiency anemia secondary to blood loss (chronic): Secondary | ICD-10-CM | POA: Diagnosis not present

## 2022-02-08 DIAGNOSIS — G9340 Encephalopathy, unspecified: Secondary | ICD-10-CM | POA: Diagnosis not present

## 2022-02-08 LAB — CBC
HCT: 27.3 % — ABNORMAL LOW (ref 36.0–46.0)
Hemoglobin: 8.8 g/dL — ABNORMAL LOW (ref 12.0–15.0)
MCH: 24.8 pg — ABNORMAL LOW (ref 26.0–34.0)
MCHC: 32.2 g/dL (ref 30.0–36.0)
MCV: 76.9 fL — ABNORMAL LOW (ref 80.0–100.0)
Platelets: 377 10*3/uL (ref 150–400)
RBC: 3.55 MIL/uL — ABNORMAL LOW (ref 3.87–5.11)
RDW: 16 % — ABNORMAL HIGH (ref 11.5–15.5)
WBC: 6.7 10*3/uL (ref 4.0–10.5)
nRBC: 0.6 % — ABNORMAL HIGH (ref 0.0–0.2)

## 2022-02-08 LAB — CULTURE, BLOOD (ROUTINE X 2): Culture: NO GROWTH

## 2022-02-08 LAB — BASIC METABOLIC PANEL
Anion gap: 10 (ref 5–15)
BUN: 8 mg/dL (ref 8–23)
CO2: 29 mmol/L (ref 22–32)
Calcium: 9.3 mg/dL (ref 8.9–10.3)
Chloride: 99 mmol/L (ref 98–111)
Creatinine, Ser: 0.87 mg/dL (ref 0.44–1.00)
GFR, Estimated: >60 mL/min (ref >60)
Glucose, Bld: 119 mg/dL — ABNORMAL HIGH (ref 70–99)
Potassium: 3.5 mmol/L (ref 3.5–5.1)
Sodium: 138 mmol/L (ref 135–145)

## 2022-02-08 LAB — GLUCOSE, CAPILLARY: Glucose-Capillary: 134 mg/dL — ABNORMAL HIGH (ref 70–99)

## 2022-02-08 MED ORDER — SODIUM CHLORIDE 0.9 % IV SOLN
2.0000 g | INTRAVENOUS | Status: DC
  Administered 2022-02-08 – 2022-02-11 (×4): 2 g via INTRAVENOUS
  Filled 2022-02-08 (×5): qty 20

## 2022-02-08 NOTE — Progress Notes (Addendum)
Physical Therapy Treatment Patient Details Name: [name] MRN: [id number] DOB: December 26, 1955 Today's Date: 02/08/2022   History of Present Illness The pt is a 66 yo female presenting 9/13 with AMS. S/p hardware revision 9/15. PMH includes: recent removal of hardware at L3, L4, and S1 with PLIF of L3-4 on 8/23, anxiety, aortic stenosis, obesity, DM II, HTN, and osteoarthritis of L hip.    PT Comments    Pt remains limited by significant back pain with radiating symptoms to RLE. Pt requiring two person min-moderate assist for functional mobility. Able to complete step pivot transfer to chair using walker, but unable to progress ambulation today due to decreased pain control. RN notified. Will continue efforts.    Recommendations for follow up therapy are one component of a multi-disciplinary discharge planning process, led by the attending physician.  Recommendations may be updated based on patient status, additional functional criteria and insurance authorization.  Follow Up Recommendations  Skilled nursing-short term rehab (<3 hours/day) Can patient physically be transported by private vehicle: No   Assistance Recommended at Discharge Frequent or constant Supervision/Assistance  Patient can return home with the following Assistance with cooking/housework;Assist for transportation;Help with stairs or ramp for entrance;Two people to help with walking and/or transfers;A lot of help with bathing/dressing/bathroom   Equipment Recommendations  None recommended by PT    Recommendations for Other Services       Precautions / Restrictions Precautions Precautions: Back;Fall Restrictions Weight Bearing Restrictions: No     Mobility  Bed Mobility Overal bed mobility: Needs Assistance Bed Mobility: Rolling, Sidelying to Sit Rolling: Min assist Sidelying to sit: Mod assist       General bed mobility comments: Pt initiating roll well, requires minA to execute. Cues for sequencing  and assist for RLE and trunk to sit upright    Transfers Overall transfer level: Needs assistance Equipment used: Rolling walker (2 wheels) Transfers: Sit to/from Stand, Bed to chair/wheelchair/BSC Sit to Stand: Min assist, +2 physical assistance   Step pivot transfers: Min assist, +2 physical assistance       General transfer comment: MinA + 2 to balance; performing stand pivot towards right    Ambulation/Gait                   Stairs             Wheelchair Mobility    Modified Rankin (Stroke Patients Only)       Balance Overall balance assessment: Needs assistance Sitting-balance support: Feet supported Sitting balance-[name]: Good     Standing balance support: Bilateral upper extremity supported, During functional activity Standing balance-[name]: Poor Standing balance comment: dependent on BUE support                            Cognition Arousal/Alertness: Awake/alert Behavior During Therapy: WFL for tasks assessed/performed Overall Cognitive Status: Within Functional Limits for tasks assessed                                 General Comments: WFL for basic mobility        Exercises      General Comments        Pertinent Vitals/Pain Pain Assessment Pain Assessment: 0-10 Pain Score: 10-Worst pain ever Pain Location: back and R leg Pain Descriptors / Indicators: Discomfort, Grimacing, Guarding, Pins and needles Pain Intervention(s): Limited activity within patient's tolerance,  Monitored during session, Premedicated before session, Repositioned, Patient requesting pain meds-RN notified    Home Living                          Prior Function            PT Goals (current goals can now be found in the care plan section) Acute Rehab PT Goals Patient Stated Goal: get stronger for home Potential to Achieve Goals: Good Progress towards PT goals: Progressing toward goals    Frequency     Min 5X/week      PT Plan Current plan remains appropriate    Co-evaluation              AM-PAC PT "6 Clicks" Mobility   Outcome Measure  Help needed turning from your back to your side while in a flat bed without using bedrails?: A Little Help needed moving from lying on your back to sitting on the side of a flat bed without using bedrails?: A Lot Help needed moving to and from a bed to a chair (including a wheelchair)?: A Little Help needed standing up from a chair using your arms (e.g., wheelchair or bedside chair)?: A Little Help needed to walk in hospital room?: Total Help needed climbing 3-5 steps with a railing? : Total 6 Click Score: 13    End of Session Equipment Utilized During Treatment: Gait belt Activity Tolerance: Patient limited by pain Patient left: in chair;with chair alarm set;with call bell/phone within reach Nurse Communication: Mobility status PT Visit Diagnosis: Unsteadiness on feet (R26.81);Other abnormalities of gait and mobility (R26.89);Muscle weakness (generalized) (M62.81)     Time: 7846-9629 PT Time Calculation (min) (ACUTE ONLY): 27 min  Charges:  $Therapeutic Activity: 23-37 mins                     Wyona Almas, PT, DPT Acute Rehabilitation Services Office [phone]    Deno Etienne 02/08/2022, 1:28 PM

## 2022-02-08 NOTE — Progress Notes (Addendum)
Subjective:   Hospital day: 5  Overnight event: No acute events overnight.  Interim History: Patient slept okay. She got up yesterday and felt a lot of pain and was scared that she was going to fall. She is getting better. She got some Ambien yesterday. She understands her pain regimen and that the pain regimen is working for her. Husband at bedside states that she is getting better. They are understanding about the facility and she is agreeable to this plan. Patient is still having that right leg pain and is unable to state if it has gotten better or not. She states that this pain is coming from the thigh and going down to the right leg. Husband states that the right leg is better.    Objective:  Vital signs in last 24 hours: Vitals:   02/07/22 2255 02/08/22 0443 02/08/22 0500 02/08/22 0741  BP:  (!) 143/68  (!) 144/66  Pulse:  100  95  Resp: 16 14    Temp:  98.9 F (37.2 C)  98.7 F (37.1 C)  TempSrc:  Oral  Oral  SpO2:  97%  98%  Weight:   85.4 kg   Height:        Filed Weights   02/06/22 0500 02/07/22 0435 02/08/22 0500  Weight: 86.2 kg 85.5 kg 85.4 kg     Intake/Output Summary (Last 24 hours) at 02/08/2022 1106 Last data filed at 02/08/2022 0300 Gross per 24 hour  Intake --  Output 1400 ml  Net -1400 ml   Net IO Since Admission: 3,967.95 mL [02/08/22 1106]  Recent Labs    02/06/22 1645 02/07/22 0440 02/07/22 2005  GLUCAP 120* 135* 108*     Pertinent Labs:    Latest Ref Rng & Units 02/08/2022    1:09 AM 02/07/2022   12:39 AM 02/06/2022    1:13 AM  CBC  WBC 4.0 - 10.5 K/uL 6.7  9.7  10.0   Hemoglobin 12.0 - 15.0 g/dL 8.8  8.0  9.0   Hematocrit 36.0 - 46.0 % 27.3  24.6  28.7   Platelets 150 - 400 K/uL 377  337  328        Latest Ref Rng & Units 02/08/2022    1:09 AM 02/07/2022   12:39 AM 02/06/2022    1:13 AM  CMP  Glucose 70 - 99 mg/dL 119  137  210   BUN 8 - 23 mg/dL '8  12  12   '$ Creatinine 0.44 - 1.00 mg/dL 0.87  0.79  0.91   Sodium 135 - 145  mmol/L 138  140  137   Potassium 3.5 - 5.1 mmol/L 3.5  3.6  3.3   Chloride 98 - 111 mmol/L 99  106  100   CO2 22 - 32 mmol/L '29  25  23   '$ Calcium 8.9 - 10.3 mg/dL 9.3  9.0  9.2     Imaging: No results found.  Physical Exam  General: Pleasant, well-appearing  in bed. No acute distress. CV: RRR. IV/VI crescendo decrescendo murmur.  No LE edema Pulmonary: Lungs CTAB. Normal effort. No wheezing or rales. Abdominal: Soft, nontender, nondistended. Normal bowel sounds. Extremities: Radial and DP pulses 2+ and symmetric. Right hip flexion 3/5, similar to previous assessment. Right knee flexion is 4/5.   Skin: Warm and dry. No obvious rash or lesions. Neuro: A&Ox3. Moves all extremities. Normal sensation. No focal deficit. Psych: Normal mood and affect   Assessment/Plan: [name] is a 66 y.o.  female with hx of hypertension, type 2 diabetes, asthma, hyperlipidemia, IDA, aortic stenosis, lumbar spondylolisthesis status post lumbar fusion and screw replacement who was brought to the ED due to altered mental status, admitted for sepsis secondary to proteus bacteremia and spinal infection. POD #3 of hardware removal.   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 She is POD #3 of hardware removal. Second blood culture still no growth after 5 days. She remains HDS, afebrile with normal white count and notes improving pain. Surgical cultures shows  rare proteus. Plan for long course of antibiotics per ID.  PT/OT evaluated patient and recommending SNF placement for rehab. Neurosurgery following patient and has assumed patient as primary. IMTS will sign off today after discussing this with NSG.  -IMTS signing off, please re-engage as needed -Infectious disease following, plan for PICC line on day of discharge -Continue cefazolin, will need 8 weeks of IV antibiotics -Continue scheduled OxyContin 20 mg q 12 hrs -Continue oxycodone 5 mg q6h prn moderate pain -Continue morphine 2-4 mg IV, q1h for severe pain -Continue Valium 5 mg q6h prn for muscle spasms -Gabapentin 300 mg 3 times daily -Continue bowel regimen with Dulcolax, senna, Senokot-S   Psoas fluid collection, c/f abscess MRI on admission showed small 1.1 x 0.8 cm fluid collection in the right psoas, musculature is suspicious for a psoas abscess. This was too small for IR to drain. Would likely require repeat imaging after long-term IV antibiotics management. -Continue antibiotics as above -Continue cefazoline 2 g IV at 200 mL/hr Q8H   Type 2 diabetes Diabetes well controlled with A1c of 6.2 3 weeks ago. CBGs have been stable in the 100s to 110s. -Continue metformin 100 mg BID with meals -CBG daily  Blood loss anemia Iron deficiency anemia Per notes, patient has IDA from inadequate intake. Patient is taking iron supplement at home. Iron panel shows iron 17, TIBC 241, and ferritin 212.  Hgb uptrending: 8.0>8.8.  No evidence of active bleed. Continue to monitor closely. -Trend CBCs   Hypertension Blood pressure elevated overnight with SBP in the 140s to 170s. Kidney function remained stable so we resumed her home Hyzaar and metoprolol and monitor closely. -Continue amlodipine 10 mg daily -Resume Hyzaar 100-25 mg daily -Resume metoprolol 100 mg at bedtime  Diet: CM IVF: None VTE: Heparin CODE: Full  Prior to Admission Living Arrangement: Home Anticipated Discharge Location:  SNF Barriers to Discharge: Pain control and SNF placement Dispo: Anticipated discharge in approximately 2-3 day(s).    Signed: Stormy Fabian, MD 02/08/2022, 11:06 AM  Pager: [phone] Internal Medicine Teaching Service After 5pm on weekdays and 1pm on weekends: On Call pager: [phone]

## 2022-02-08 NOTE — Progress Notes (Addendum)
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   #Post-op lumbar infection SP I&D and HW removal with Cx+ Proteus mirabilis  on 9/15 #Psoas abscess #Proteus mirabilis bacteremia - Patient reports her drain came out about a week ago.  She started having worsening pain about a week ago as well.  The drain has just "slacked off".  She also notes left groin pain radiating down her leg. - She is afebrile without leukocytosis on admission. - Blood cultures on 9/13 growing Proteus, 1 out of 2.  Currently on Vanco cefepime and metronidazole. - On 9/13 showed soft tissue abscess extends anteriorly to fusion hardware L3-L2 L4 vertebral body.  The abscess extends into epidural space at L3 and L4 where there is epidural phlegmon resulting in severe canal stenosis.  1.1 collection in the right musculature suspicious for sepsis.  Perihardware fracture of L3 vertebral body. -IR was engaged and psoas collection not accessible.She did not co/o of groin pain today Recommendations: - Discontinue ancef - Start ceftriaxone (OR Proteus cefazolin MIC 8 as such will switch to ceftriaxone) x 8 weeks from OR EOT -Of note although old  hardware was removed, pedicle screws were placed in OR and S1 screws replaced, OR Cx+ Proteus mirabilis as such pt will need atleast 6 months of suppressive antibiotics  with cefadroxil following completion of IV therapy.  -Place PICC orders are in -follow with neurosurgery  ID will sign off, please engage with any question or concerns.   OPAT ORDERS:  Diagnosis: vertebral osteomyelitis with hardware in place  Culture Result: proteus mirabilis  Allergies  Allergen Reactions   Crestor [Rosuvastatin] Other (See Comments)    Myalgia    Robaxin [Methocarbamol] Other (See Comments)    Insomnia   Toradol [Ketorolac Tromethamine] Other (See Comments)   Zocor [Simvastatin] Other (See Comments)    Myalgias    Lipitor [Atorvastatin] Other (See Comments)    Myalgia   Sulfa Antibiotics Hives     Discharge antibiotics to be given via PICC line:  Per pharmacy protocol Ceftriaxone 2gm q 24h    Duration: 8 weeks End Date: 04/02/22  [facility] Care Per Protocol with [employer] Use: Home health RN for IV administration and teaching, line care and labs.    Labs weekly while on IV antibiotics: __ CBC with differential  __ CMP __ CRP __ ESR   __ Please pull PIC at completion of IV antibiotics  Fax weekly labs to [phone]  Clinic Follow Up Appt: 10/9  @ [facility] with Dr.Magan Winnett   Microbiology:   Antibiotics: Vancomycin 9/13-14 Cefepime 9/13-14 Metronidazole 9/13  Cultures: Blood 9/13 1/2 proteus mirabilis   SUBJECTIVE: Resting in bed.. No new complatins Interval:  afebrile overnight.  Review of Systems: Review of Systems  All other systems reviewed and are negative.    Scheduled  Meds:  acetaminophen  1,000 mg Oral TID   amLODipine  10 mg Oral Daily   gabapentin  300 mg Oral TID   heparin injection (subcutaneous)  5,000 Units Subcutaneous Q8H   losartan  100 mg Oral Daily   And   hydrochlorothiazide  25 mg Oral Daily   metFORMIN  1,000 mg Oral BID WC   metoprolol succinate  100 mg Oral QHS   oxyCODONE  20 mg Oral Q12H   senna  1 tablet Oral BID   Continuous Infusions:  0.9 % NaCl with KCl 20 mEq / L 80 mL/hr at 02/06/22 7217    cefTRIAXone (ROCEPHIN)  IV     PRN Meds:.albuterol, bisacodyl, diazepam, menthol-cetylpyridinium **OR** phenol, morphine injection, oxyCODONE, senna-docusate, zolpidem Allergies  Allergen Reactions   Crestor [Rosuvastatin] Other (See Comments)    Myalgia    Robaxin [Methocarbamol] Other (See Comments)    Insomnia   Toradol [Ketorolac Tromethamine] Other (See Comments)   Zocor [Simvastatin] Other (See Comments)    Myalgias    Lipitor [Atorvastatin] Other (See Comments)    Myalgia   Sulfa Antibiotics Hives    OBJECTIVE: Vitals:   02/07/22 2255 02/08/22 0443 02/08/22 0500 02/08/22 0741  BP:  (!) 143/68  (!) 144/66  Pulse:  100  95  Resp: 16 14    Temp:  98.9 F (37.2 C)  98.7 F (37.1 C)  TempSrc:  Oral  Oral  SpO2:  97%  98%  Weight:   85.4 kg   Height:       Body mass index is 39.35 kg/m.  Physical Exam Constitutional:      Appearance: Normal appearance.  HENT:     Head: Normocephalic and atraumatic.     Right Ear: Tympanic membrane normal.     Left Ear: Tympanic membrane normal.     Nose: Nose normal.     Mouth/Throat:     Mouth: Mucous membranes are moist.  Eyes:     Extraocular Movements: Extraocular movements intact.     Conjunctiva/sclera: Conjunctivae normal.     Pupils: Pupils are equal, round, and reactive to light.  Cardiovascular:     Rate and Rhythm: Normal rate and regular rhythm.     Heart sounds: No murmur heard.    No friction rub. No gallop.  Pulmonary:     Effort: Pulmonary effort is normal.     Breath sounds: Normal breath sounds.  Abdominal:     General: Abdomen is flat.     Palpations: Abdomen is soft.  Skin:    General: Skin is warm and dry.  Neurological:     General: No focal deficit present.     Mental Status: She is alert and oriented to person, place, and time.  Psychiatric:        Mood and Affect: Mood normal.       Lab Results Lab Results  Component Value Date   WBC 6.7 02/08/2022   HGB 8.8 (L) 02/08/2022   HCT  27.3 (L) 02/08/2022   MCV 76.9 (L) 02/08/2022   PLT 377 02/08/2022    Lab Results  Component Value Date   CREATININE 0.87 02/08/2022   BUN 8 02/08/2022   NA 138 02/08/2022   K 3.5 02/08/2022   CL 99 02/08/2022   CO2 29 02/08/2022    Lab Results  Component Value Date   ALT 12 02/03/2022   AST 16 02/03/2022   ALKPHOS 71 02/03/2022   BILITOT 0.6 02/03/2022  Laurice Record, MD [facility] 02/08/2022, 3:41 PM

## 2022-02-08 NOTE — Progress Notes (Signed)
PHARMACY CONSULT NOTE FOR:  OUTPATIENT  PARENTERAL ANTIBIOTIC THERAPY (OPAT)  Indication: Proteus post-op lumbar wound infection  Regimen: Ceftriaxone 2 gm IV Q 24 hours   End date: 04/02/22   IV antibiotic discharge orders are pended. To discharging provider:  please sign these orders via discharge navigator,  Select New Orders & click on the button choice - Manage This Unsigned Work.     Thank you for allowing pharmacy to be a part of this patient's care.  Jimmy Footman, PharmD, BCPS, BCIDP Infectious Diseases Clinical Pharmacist Phone: [phone] 02/08/2022, 2:23 PM

## 2022-02-08 NOTE — Progress Notes (Signed)
Patient ID: [name], female   DOB: 29-Mar-1956, 66 y.o.   MRN: [id number] BP 128/64 (BP Location: Right Arm)   Pulse 88   Temp 98.6 F (37 C) (Oral)   Resp 15   Ht '4\' 10"'$  (1.473 m)   Wt 85.4 kg   SpO2 99%   BMI 39.35 kg/m  Currently sleeping. Right lower extremity pain probably due to inflammation in the psoas muscle Working with PT Antibiotics continued ID plan left in chart Slow improvement overall

## 2022-02-08 NOTE — Care Management Important Message (Signed)
Important Message  Patient Details  Name: [name] MRN: [id number] Date of Birth: 1955/10/05   Medicare Important Message Given:  Yes     Hannah Beat 02/08/2022, 10:59 AM

## 2022-02-09 LAB — CBC
HCT: 25.8 % — ABNORMAL LOW (ref 36.0–46.0)
Hemoglobin: 8 g/dL — ABNORMAL LOW (ref 12.0–15.0)
MCH: 24.2 pg — ABNORMAL LOW (ref 26.0–34.0)
MCHC: 31 g/dL (ref 30.0–36.0)
MCV: 77.9 fL — ABNORMAL LOW (ref 80.0–100.0)
Platelets: 365 10*3/uL (ref 150–400)
RBC: 3.31 MIL/uL — ABNORMAL LOW (ref 3.87–5.11)
RDW: 16.1 % — ABNORMAL HIGH (ref 11.5–15.5)
WBC: 9.9 10*3/uL (ref 4.0–10.5)
nRBC: 0.2 % (ref 0.0–0.2)

## 2022-02-09 LAB — GLUCOSE, CAPILLARY: Glucose-Capillary: 120 mg/dL — ABNORMAL HIGH (ref 70–99)

## 2022-02-09 NOTE — TOC Progression Note (Signed)
Transition of Care [facility]) - Progression Note    Patient Details  Name: [name] MRN: [id number] Date of Birth: 20-Mar-1956  Transition of Care [facility]) CM/SW [place], [place] Phone Number: 02/09/2022, 11:40 AM  Clinical Narrative:    CSW met with pt and spouse at bedside. Spouse stating he thinks pt needs rehab and pt is agreeable. Bed offers provided. CSW will follow up for bed choice.   Expected Discharge Plan: [place] Barriers to Discharge: Continued Medical Work up, SNF Pending bed offer, Ship broker  Expected Discharge Plan and Services Expected Discharge Plan: [place] arrangements for the past 2 months: Single Family Home                                       Social Determinants of Health (SDOH) Interventions    Readmission Risk Interventions     No data to display

## 2022-02-09 NOTE — Progress Notes (Signed)
Patient ID: [name], female   DOB: 12-07-1955, 66 y.o.   MRN: [id number] BP (!) 132/59 (BP Location: Right Arm)   Pulse 90   Temp 98.5 F (36.9 C) (Oral)   Resp 16   Ht '4\' 10"'$  (1.473 m)   Wt 86.1 kg   SpO2 98%   BMI 39.67 kg/m  Alert and oriented x 4 Moving all extremities Working with PT, they have recommended SNF, patient seems to be agreeable to.  Continue abx

## 2022-02-09 NOTE — Progress Notes (Signed)
Physical Therapy Treatment Patient Details Name: [name] MRN: [id number] DOB: 04-18-1956 Today's Date: 02/09/2022   History of Present Illness The pt is a 66 yo female presenting 9/13 with AMS. S/p hardware revision 9/15. PMH includes: recent removal of hardware at L3, L4, and S1 with PLIF of L3-4 on 8/23, anxiety, aortic stenosis, obesity, DM II, HTN, and osteoarthritis of L hip.    PT Comments    Pt progressing slowly towards her physical therapy goals. Remains limited by high levels of pain in RLE and back. Pt requiring two person minimal assist for functional mobility. Ambulating ~5 ft with a walker and close chair follow. HR up to 141 bpm. Will progress as tolerated.     Recommendations for follow up therapy are one component of a multi-disciplinary discharge planning process, led by the attending physician.  Recommendations may be updated based on patient status, additional functional criteria and insurance authorization.  Follow Up Recommendations  Skilled nursing-short term rehab (<3 hours/day) Can patient physically be transported by private vehicle: No   Assistance Recommended at Discharge Frequent or constant Supervision/Assistance  Patient can return home with the following Assistance with cooking/housework;Assist for transportation;Help with stairs or ramp for entrance;Two people to help with walking and/or transfers;A lot of help with bathing/dressing/bathroom   Equipment Recommendations  None recommended by PT    Recommendations for Other Services       Precautions / Restrictions Precautions Precautions: Back;Fall Precaution Booklet Issued: Yes (comment) Precaution Comments: reviewed back precautions Restrictions Weight Bearing Restrictions: No     Mobility  Bed Mobility Overal bed mobility: Needs Assistance Bed Mobility: Rolling, Sidelying to Sit Rolling: Min assist Sidelying to sit: Min assist       General bed mobility comments: Pt initiating  rolling to right, assist for RLE management off edge of bed.    Transfers Overall transfer level: Needs assistance Equipment used: Rolling walker (2 wheels) Transfers: Sit to/from Stand Sit to Stand: Min assist           General transfer comment: MinA to rise from edge of bed, pt preferring to push off from walker.    Ambulation/Gait Ambulation/Gait assistance: Min assist, +2 safety/equipment Gait Distance (Feet): 5 Feet Assistive device: Rolling walker (2 wheels) Gait Pattern/deviations: Step-through pattern, Decreased stride length, Shuffle Gait velocity: decreased Gait velocity interpretation: <1.31 ft/sec, indicative of household ambulator   General Gait Details: shuffling gait pattern with decreased foot clearance, chair follow utilized.   Stairs             Wheelchair Mobility    Modified Rankin (Stroke Patients Only)       Balance Overall balance assessment: Needs assistance Sitting-balance support: Feet supported Sitting balance-[name]: Good     Standing balance support: Bilateral upper extremity supported, During functional activity, Reliant on assistive device for balance Standing balance-[name]: Poor Standing balance comment: dependent on BUE support                            Cognition Arousal/Alertness: Awake/alert Behavior During Therapy: WFL for tasks assessed/performed Overall Cognitive Status: Impaired/Different from baseline Area of Impairment: Problem solving, Safety/judgement                         Safety/Judgement: Decreased awareness of deficits   Problem Solving: Requires verbal cues, Requires tactile cues          Exercises General Exercises - Lower Extremity  Quad Sets: Both, 10 reps, Supine Heel Slides: Right, 5 reps, Supine Hip ABduction/ADduction: Right, 5 reps, Supine    General Comments General comments (skin integrity, edema, etc.): HR up to 121 with limited mobility from bedside  chair>bed, resting in 90s at end of session      Pertinent Vitals/Pain Pain Assessment Pain Assessment: Faces Faces Pain Scale: Hurts worst Pain Location: RLE, back Pain Descriptors / Indicators: Discomfort, Grimacing, Pins and needles Pain Intervention(s): Limited activity within patient's tolerance, Monitored during session, Premedicated before session    Home Living                          Prior Function            PT Goals (current goals can now be found in the care plan section) Acute Rehab PT Goals Patient Stated Goal: get stronger for home Potential to Achieve Goals: Good Progress towards PT goals: Progressing toward goals    Frequency    Min 5X/week      PT Plan Current plan remains appropriate    Co-evaluation              AM-PAC PT "6 Clicks" Mobility   Outcome Measure  Help needed turning from your back to your side while in a flat bed without using bedrails?: A Little Help needed moving from lying on your back to sitting on the side of a flat bed without using bedrails?: A Little Help needed moving to and from a bed to a chair (including a wheelchair)?: A Little Help needed standing up from a chair using your arms (e.g., wheelchair or bedside chair)?: A Little Help needed to walk in hospital room?: Total Help needed climbing 3-5 steps with a railing? : Total 6 Click Score: 14    End of Session Equipment Utilized During Treatment: Gait belt Activity Tolerance: Patient tolerated treatment well Patient left: in chair;with chair alarm set;with call bell/phone within reach Nurse Communication: Mobility status PT Visit Diagnosis: Unsteadiness on feet (R26.81);Other abnormalities of gait and mobility (R26.89);Muscle weakness (generalized) (M62.81)     Time: 2423-5361 PT Time Calculation (min) (ACUTE ONLY): 22 min  Charges:  $Therapeutic Activity: 8-22 mins                     Wyona Almas, PT, DPT Acute Rehabilitation  Services Office [phone]    Deno Etienne 02/09/2022, 2:17 PM

## 2022-02-09 NOTE — Progress Notes (Signed)
Occupational Therapy Treatment Patient Details Name: [name] MRN: [id number] DOB: 02-07-1956 Today's Date: 02/09/2022   History of present illness The pt is a 66 yo female presenting 9/13 with AMS. S/p hardware revision 9/15. PMH includes: recent removal of hardware at L3, L4, and S1 with PLIF of L3-4 on 8/23, anxiety, aortic stenosis, obesity, DM II, HTN, and osteoarthritis of L hip.   OT comments  Chart reviewed, pt greeted in chair reporting 10/10 pain requesting to return to bed. Tx session targeted improving activity tolerance for increased independence in ADLs. MIN A required for STS with RW, short amb transfer to bed with MIN A with RW with vcs provided throughout for technique and safety. Bed mobility completed with MOD A for management of BLE, vcs for appropriate body mechanics. Supervision required for grooming tasks with vcs required for optimal positioning. Pt declined further mobility due to pain, RN in room at end of session. Discharge recommendation remains appropriate.    Recommendations for follow up therapy are one component of a multi-disciplinary discharge planning process, led by the attending physician.  Recommendations may be updated based on patient status, additional functional criteria and insurance authorization.    Follow Up Recommendations  Skilled nursing-short term rehab (<3 hours/day)    Assistance Recommended at Discharge Frequent or constant Supervision/Assistance  Patient can return home with the following  A lot of help with walking and/or transfers;A lot of help with bathing/dressing/bathroom;Assistance with cooking/housework;Assistance with feeding;Assist for transportation   Equipment Recommendations  Other (comment) (per next venue of care)    Recommendations for Other Services      Precautions / Restrictions Precautions Precautions: Back;Fall Restrictions Weight Bearing Restrictions: No       Mobility Bed Mobility Overal bed  mobility: Needs Assistance Bed Mobility: Sit to Supine         Sit to sidelying: Mod assist General bed mobility comments: MOD A for BLE management, intermittent vcs for body mechanics    Transfers Overall transfer level: Needs assistance Equipment used: Rolling walker (2 wheels) Transfers: Sit to/from Stand Sit to Stand: Min assist, From elevated surface (pillow on bedside chair)                 Balance Overall balance assessment: Needs assistance Sitting-balance support: Feet supported Sitting balance-[name]: Good     Standing balance support: Bilateral upper extremity supported, During functional activity, Reliant on assistive device for balance Standing balance-[name]: Poor                             ADL either performed or assessed with clinical judgement   ADL Overall ADL's : Needs assistance/impaired     Grooming: Oral care;Sitting;Set up Grooming Details (indicate cue type and reason): vcs for optimal positioning to decrease aspiration risk                 Toilet Transfer: Minimal assistance;Cueing for sequencing;Cueing for safety;Rolling walker (2 wheels) Toilet Transfer Details (indicate cue type and reason): short ambulatory transfer back to bed from beside chair                Extremity/Trunk Assessment              Vision       Perception     Praxis      Cognition Arousal/Alertness: Awake/alert Behavior During Therapy: WFL for tasks assessed/performed Overall Cognitive Status: Within Functional Limits for tasks assessed Area  of Impairment: Problem solving, Safety/judgement                         Safety/Judgement: Decreased awareness of deficits   Problem Solving: Requires verbal cues, Requires tactile cues          Exercises      Shoulder Instructions       General Comments HR up to 121 with limited mobility from bedside chair>bed, resting in 90s at end of session    Pertinent  Vitals/ Pain       Pain Assessment Pain Assessment: 0-10 Pain Score: 10-Worst pain ever Pain Location: RLE Pain Descriptors / Indicators: Discomfort, Grimacing, Pins and needles Pain Intervention(s): Limited activity within patient's tolerance, Monitored during session, Repositioned (RN in room at end of session)  Home Living                                          Prior Functioning/Environment              Frequency  Min 2X/week        Progress Toward Goals  OT Goals(current goals can now be found in the care plan section)  Progress towards OT goals: Progressing toward goals     Plan Discharge plan remains appropriate    Co-evaluation                 AM-PAC OT "6 Clicks" Daily Activity     Outcome Measure   Help from another person eating meals?: A Little Help from another person taking care of personal grooming?: A Little Help from another person toileting, which includes using toliet, bedpan, or urinal?: Total Help from another person bathing (including washing, rinsing, drying)?: A Lot Help from another person to put on and taking off regular upper body clothing?: A Little Help from another person to put on and taking off regular lower body clothing?: A Lot 6 Click Score: 14    End of Session Equipment Utilized During Treatment: Rolling walker (2 wheels)  OT Visit Diagnosis: Unsteadiness on feet (R26.81)   Activity Tolerance Patient tolerated treatment well   Patient Left in bed;with call bell/phone within reach;with bed alarm set   Nurse Communication Mobility status (small amount of drainage from surgical site onto pillow in chair)        Time: 3419-6222 OT Time Calculation (min): 13 min  Charges: OT General Charges $OT Visit: 1 Visit OT Treatments $Therapeutic Activity: 8-22 mins  Shanon Payor, OTD OTR/L  02/09/22, 12:51 PM

## 2022-02-10 LAB — AEROBIC/ANAEROBIC CULTURE W GRAM STAIN (SURGICAL/DEEP WOUND)

## 2022-02-10 LAB — GLUCOSE, CAPILLARY
Glucose-Capillary: 110 mg/dL — ABNORMAL HIGH (ref 70–99)
Glucose-Capillary: 138 mg/dL — ABNORMAL HIGH (ref 70–99)
Glucose-Capillary: 124 mg/dL — ABNORMAL HIGH (ref 70–99)

## 2022-02-10 LAB — CBC
HCT: 26.8 % — ABNORMAL LOW (ref 36.0–46.0)
Hemoglobin: 8.5 g/dL — ABNORMAL LOW (ref 12.0–15.0)
MCH: 24.6 pg — ABNORMAL LOW (ref 26.0–34.0)
MCHC: 31.7 g/dL (ref 30.0–36.0)
MCV: 77.5 fL — ABNORMAL LOW (ref 80.0–100.0)
Platelets: 414 10*3/uL — ABNORMAL HIGH (ref 150–400)
RBC: 3.46 MIL/uL — ABNORMAL LOW (ref 3.87–5.11)
RDW: 16.3 % — ABNORMAL HIGH (ref 11.5–15.5)
WBC: 11.4 10*3/uL — ABNORMAL HIGH (ref 4.0–10.5)
nRBC: 0.2 % (ref 0.0–0.2)

## 2022-02-10 NOTE — Progress Notes (Addendum)
Spoke with primary RN Sam, patient is not going to be d/c today but she will asked Education officer, museum for update. Will follow up.  UPDATE: Per primary RN via secure chat," The patient is discharging tomorrow per social work ".

## 2022-02-10 NOTE — TOC Progression Note (Signed)
Transition of Care [facility]) - Progression Note    Patient Details  Name: REKITA MIOTKE MRN: [id number] Date of Birth: November 18, 1955  Transition of Care [facility]) CM/SW [place], [place] Phone Number: 02/10/2022, 1:51 PM  Clinical Narrative:    CSW met with pt and family in the room, they have decided on [facility]. Facility will have a bed tomorrow. Authorization has been approved: [id number] 02/11/2022-02/15/2022 Plan to DC after 11 tomorrow. TOC will continue to follow for DC needs.  Expected Discharge Plan: [place] Barriers to Discharge: Continued Medical Work up, SNF Pending bed offer, Ship broker  Expected Discharge Plan and Services Expected Discharge Plan: [place] arrangements for the past 2 months: Single Family Home                                       Social Determinants of Health (SDOH) Interventions    Readmission Risk Interventions     No data to display

## 2022-02-10 NOTE — Progress Notes (Signed)
Physical Therapy Treatment Patient Details Name: [name] MRN: [id number] DOB: 13-Mar-1956 Today's Date: 02/10/2022   History of Present Illness The pt is a 66 yo female presenting 9/13 with AMS. S/p hardware revision 9/15. PMH includes: recent removal of hardware at L3, L4, and S1 with PLIF of L3-4 on 8/23, anxiety, aortic stenosis, obesity, DM II, HTN, and osteoarthritis of L hip.    PT Comments    Pt with improved pain control and activity tolerance today. Requiring min assist for transfers and ambulating 15 ft with a walker and chair follow. Continues with RLE weakness and pain. Would benefit from ST SNF to address deficits and maximize functional mobility.    Recommendations for follow up therapy are one component of a multi-disciplinary discharge planning process, led by the attending physician.  Recommendations may be updated based on patient status, additional functional criteria and insurance authorization.  Follow Up Recommendations  Skilled nursing-short term rehab (<3 hours/day) Can patient physically be transported by private vehicle: No   Assistance Recommended at Discharge Frequent or constant Supervision/Assistance  Patient can return home with the following Assistance with cooking/housework;Assist for transportation;Help with stairs or ramp for entrance;Two people to help with walking and/or transfers;A lot of help with bathing/dressing/bathroom   Equipment Recommendations  None recommended by PT    Recommendations for Other Services       Precautions / Restrictions Precautions Precautions: Back;Fall Precaution Booklet Issued: Yes (comment) Precaution Comments: reviewed back precautions Restrictions Weight Bearing Restrictions: No     Mobility  Bed Mobility Overal bed mobility: Needs Assistance Bed Mobility: Rolling, Sidelying to Sit Rolling: Supervision Sidelying to sit: Supervision       General bed mobility comments: No physical assist required  this session. Increased time/effort    Transfers Overall transfer level: Needs assistance Equipment used: Rolling walker (2 wheels) Transfers: Sit to/from Stand, Bed to chair/wheelchair/BSC Sit to Stand: Min assist Stand pivot transfers: Min guard         General transfer comment: MinA to rise from edge of bed, pt preferring to push off from walker. Pivoting to [facility] towards left    Ambulation/Gait Ambulation/Gait assistance: +2 safety/equipment, Min guard Gait Distance (Feet): 15 Feet Assistive device: Rolling walker (2 wheels) Gait Pattern/deviations: Step-through pattern, Decreased stride length, Shuffle Gait velocity: decreased     General Gait Details: shuffling gait pattern with decreased foot clearance, despite cues, chair follow utilized.   Stairs             Wheelchair Mobility    Modified Rankin (Stroke Patients Only)       Balance Overall balance assessment: Needs assistance Sitting-balance support: Feet supported Sitting balance-[name]: Good     Standing balance support: Bilateral upper extremity supported, During functional activity, Reliant on assistive device for balance Standing balance-[name]: Poor Standing balance comment: dependent on BUE support                            Cognition Arousal/Alertness: Awake/alert Behavior During Therapy: WFL for tasks assessed/performed Overall Cognitive Status: Impaired/Different from baseline Area of Impairment: Problem solving, Safety/judgement                         Safety/Judgement: Decreased awareness of deficits   Problem Solving: Requires verbal cues, Requires tactile cues          Exercises General Exercises - Lower Extremity Quad Sets: Both, 10 reps, Supine Heel  Slides: Right, Supine, 10 reps Hip ABduction/ADduction: Right, Supine, 10 reps    General Comments        Pertinent Vitals/Pain Pain Assessment Pain Assessment: Faces Faces Pain Scale:  Hurts whole lot Pain Location: RLE, back Pain Descriptors / Indicators: Discomfort, Grimacing, Pins and needles Pain Intervention(s): Monitored during session, Limited activity within patient's tolerance, Premedicated before session    Home Living                          Prior Function            PT Goals (current goals can now be found in the care plan section) Acute Rehab PT Goals Patient Stated Goal: get stronger for home Potential to Achieve Goals: Good Progress towards PT goals: Progressing toward goals    Frequency    Min 5X/week      PT Plan Current plan remains appropriate    Co-evaluation              AM-PAC PT "6 Clicks" Mobility   Outcome Measure  Help needed turning from your back to your side while in a flat bed without using bedrails?: A Little Help needed moving from lying on your back to sitting on the side of a flat bed without using bedrails?: A Little Help needed moving to and from a bed to a chair (including a wheelchair)?: A Little Help needed standing up from a chair using your arms (e.g., wheelchair or bedside chair)?: A Little Help needed to walk in hospital room?: A Little Help needed climbing 3-5 steps with a railing? : Total 6 Click Score: 16    End of Session Equipment Utilized During Treatment: Gait belt Activity Tolerance: Patient tolerated treatment well Patient left: in chair;with chair alarm set;with call bell/phone within reach Nurse Communication: Mobility status PT Visit Diagnosis: Unsteadiness on feet (R26.81);Other abnormalities of gait and mobility (R26.89);Muscle weakness (generalized) (M62.81)     Time: 1031-1050 PT Time Calculation (min) (ACUTE ONLY): 19 min  Charges:  $Gait Training: 8-22 mins                     [name], PT, DPT Acute Rehabilitation Services Office [phone]    Deno Etienne 02/10/2022, 12:14 PM

## 2022-02-10 NOTE — Plan of Care (Signed)

## 2022-02-10 NOTE — Progress Notes (Signed)
Inpatient Rehabilitation Admissions Coordinator   Rehab consult received. Unable to pursue CIR level rehab with [facility] for current therapy recommendations are for SNF. Noted SW is pursuing SNF recommendation.  Danne Baxter, RN, MSN Rehab Admissions Coordinator [phone] 02/10/2022 1:02 PM

## 2022-02-10 NOTE — Progress Notes (Signed)
Patient ID: [name], female   DOB: [birth year], 66 y.o.   MRN: [id number] BP (!) 116/50 (BP Location: Left Arm)   Pulse 94   Temp 98.2 F (36.8 C) (Oral)   Resp 18   Ht '4\' 10"'$  (1.473 m)   Wt 86.1 kg   SpO2 97%   BMI 39.67 kg/m  Alert and oriented x 4 Speech is clear and fluent Moving lower extremities Due to insurance, therapy recommendations are for a SNF

## 2022-02-11 LAB — GLUCOSE, CAPILLARY
Glucose-Capillary: 129 mg/dL — ABNORMAL HIGH (ref 70–99)
Glucose-Capillary: 119 mg/dL — ABNORMAL HIGH (ref 70–99)
Glucose-Capillary: 114 mg/dL — ABNORMAL HIGH (ref 70–99)
Glucose-Capillary: 133 mg/dL — ABNORMAL HIGH (ref 70–99)

## 2022-02-11 MED ORDER — CEFTRIAXONE IV (FOR PTA / DISCHARGE USE ONLY): 2.0000 g | INTRAVENOUS | 0 refills | Status: AC

## 2022-02-11 MED ORDER — SODIUM CHLORIDE 0.9% FLUSH: 10.0000 mL | INTRAVENOUS | Status: DC | PRN

## 2022-02-11 MED ORDER — OXYCODONE HCL 5 MG PO TABS: 5.0000 mg | ORAL_TABLET | Freq: Four times a day (QID) | ORAL | 0 refills | Status: DC | PRN

## 2022-02-11 MED ORDER — OXYCODONE HCL ER 10 MG PO T12A: 10.0000 mg | EXTENDED_RELEASE_TABLET | Freq: Two times a day (BID) | ORAL | 0 refills | Status: AC

## 2022-02-11 MED ORDER — CHLORHEXIDINE GLUCONATE CLOTH 2 % EX PADS
6.0000 | MEDICATED_PAD | Freq: Every day | CUTANEOUS | Status: DC
  Administered 2022-02-11: 6 via TOPICAL

## 2022-02-11 NOTE — Care Management Important Message (Signed)
Important Message  Patient Details  Name: [name] MRN: [id number] Date of Birth: 1955/09/10   Medicare Important Message Given:  Yes     Hannah Beat 02/11/2022, 11:22 AM

## 2022-02-11 NOTE — TOC Transition Note (Addendum)
Transition of Care [facility]) - CM/SW Discharge Note   Patient Details  Name: [name] MRN: [id number] Date of Birth: November 23, 1955  Transition of Care [facility]) CM/SW Contact:  Coralee Pesa, [place] Phone Number: 02/11/2022, 11:59 AM   Clinical Narrative:    CSW notified [facility] has 20 in line and pt will not get there until very early tomorrow morning. RN to request that MD discharge tonight so pt can be transported first thing in the morning.  Discharge has not been put in yet, CSW left VM for the office. Pt can DC to facility once that is in. RN to call [facility] when pt is discharged.  Daytime      [facility] [phone] After Hours [facility] [phone] RN to notify admissions, Brittney, when discharge is available. [phone]  Pt to be transported to [facility] via [place], pending discharge. [facility] ready for patient. Nurse to call report to [phone].   Final next level of care: Skilled Nursing Facility Barriers to Discharge: SNF Pending discharge summary   Patient Goals and CMS Choice Patient states their goals for this hospitalization and ongoing recovery are:: return home CMS Medicare.gov Compare Post Acute Care list provided to:: Patient Choice offered to / list presented to : Patient  Discharge Placement              Patient chooses bed at: [facility] Patient to be transferred to facility by: [place] Name of family member notified: Spouse Patient and family notified of of transfer: 02/11/22  Discharge Plan and Services                                     Social Determinants of Health (SDOH) Interventions     Readmission Risk Interventions     No data to display

## 2022-02-11 NOTE — Progress Notes (Signed)
Physical Therapy Treatment Patient Details Name: [name] MRN: [id number] DOB: 1956-04-18 Today's Date: 02/11/2022   History of Present Illness The pt is a 66 yo female presenting 9/13 with AMS. S/p hardware revision 9/15. PMH includes: recent removal of hardware at L3, L4, and S1 with PLIF of L3-4 on 8/23, anxiety, aortic stenosis, obesity, DM II, HTN, and osteoarthritis of L hip.    PT Comments    Patient seen to progress mobility. Patient able to increase ambulation distance to 30' x 2 with min guard, RW, and close chair follow. Patient complaining of R LE pain and weakness during mobility and demonstrating decreased foot clearance on R. Continues to require minA for bed mobility due to R LE weakness. Continue to recommend SNF for ongoing Physical Therapy.       Recommendations for follow up therapy are one component of a multi-disciplinary discharge planning process, led by the attending physician.  Recommendations may be updated based on patient status, additional functional criteria and insurance authorization.  Follow Up Recommendations  Skilled nursing-short term rehab (<3 hours/day) Can patient physically be transported by private vehicle: No   Assistance Recommended at Discharge Frequent or constant Supervision/Assistance  Patient can return home with the following Assistance with cooking/housework;Assist for transportation;Help with stairs or ramp for entrance;Two people to help with walking and/or transfers;A lot of help with bathing/dressing/bathroom   Equipment Recommendations  None recommended by PT    Recommendations for Other Services       Precautions / Restrictions Precautions Precautions: Back;Fall Precaution Booklet Issued: Yes (comment) Precaution Comments: reviewed back precautions Restrictions Weight Bearing Restrictions: No     Mobility  Bed Mobility Overal bed mobility: Needs Assistance Bed Mobility: Rolling, Sidelying to Sit, Sit to  Sidelying Rolling: Supervision Sidelying to sit: Min assist     Sit to sidelying: Min assist General bed mobility comments: assist to readjust R LE with supine>sit and bringing LEs back into bed    Transfers Overall transfer level: Needs assistance Equipment used: Rolling [name] (2 wheels) Transfers: Sit to/from Stand Sit to Stand: Min assist, Min guard           General transfer comment: minA to rise from EOB with cues for hand placement. Min guard to stand from recliner    Ambulation/Gait Ambulation/Gait assistance: Min guard, +2 safety/equipment Gait Distance (Feet): 30 Feet (30') Assistive device: Rolling [name] (2 wheels) Gait Pattern/deviations: Step-through pattern, Decreased stride length, Shuffle Gait velocity: decreased     General Gait Details: decreased foot clearance on R due to weakness and pain. Min guard with chair follow for safety. able to take seated rest break then increase ambulation distance.   Stairs             Wheelchair Mobility    Modified Rankin (Stroke Patients Only)       Balance Overall balance assessment: Needs assistance Sitting-balance support: Feet supported Sitting balance-[name]: Good     Standing balance support: Bilateral upper extremity supported, During functional activity, Reliant on assistive device for balance Standing balance-[name]: Poor                              Cognition Arousal/Alertness: Awake/alert Behavior During Therapy: WFL for tasks assessed/performed Overall Cognitive Status: Impaired/Different from baseline Area of Impairment: Problem solving, Safety/judgement  Safety/Judgement: Decreased awareness of deficits   Problem Solving: Requires verbal cues, Requires tactile cues          Exercises      General Comments General comments (skin integrity, edema, etc.): HR up to 150 with mobility      Pertinent Vitals/Pain Pain  Assessment Pain Assessment: 0-10 Pain Score: 7  Pain Location: RLE, back Pain Descriptors / Indicators: Discomfort, Grimacing, Pins and needles Pain Intervention(s): Monitored during session, Repositioned    Home Living                          Prior Function            PT Goals (current goals can now be found in the care plan section) Acute Rehab PT Goals PT Goal Formulation: With patient Time For Goal Achievement: 02/20/22 Potential to Achieve Goals: Good Progress towards PT goals: Progressing toward goals    Frequency    Min 5X/week      PT Plan Current plan remains appropriate    Co-evaluation              AM-PAC PT "6 Clicks" Mobility   Outcome Measure  Help needed turning from your back to your side while in a flat bed without using bedrails?: A Little Help needed moving from lying on your back to sitting on the side of a flat bed without using bedrails?: A Little Help needed moving to and from a bed to a chair (including a wheelchair)?: A Little Help needed standing up from a chair using your arms (e.g., wheelchair or bedside chair)?: A Little Help needed to walk in hospital room?: A Little Help needed climbing 3-5 steps with a railing? : Total 6 Click Score: 16    End of Session Equipment Utilized During Treatment: Gait belt Activity Tolerance: Patient tolerated treatment well Patient left: in bed;with call bell/phone within reach;with nursing/sitter in room (RN present in room, notified that bed alarm not on) Nurse Communication: Mobility status PT Visit Diagnosis: Unsteadiness on feet (R26.81);Other abnormalities of gait and mobility (R26.89);Muscle weakness (generalized) (M62.81)     Time: 1115-5208 PT Time Calculation (min) (ACUTE ONLY): 19 min  Charges:  $Gait Training: 8-22 mins                     [name] A. [name] PT, DPT Acute Rehabilitation Services Office [phone]    [name] 02/11/2022, 5:12 PM

## 2022-02-11 NOTE — Progress Notes (Signed)
Peripherally Inserted Central Catheter Placement  The IV Nurse has discussed with the patient and/or persons authorized to consent for the patient, the purpose of this procedure and the potential benefits and risks involved with this procedure.  The benefits include less needle sticks, lab draws from the catheter, and the patient may be discharged home with the catheter. Risks include, but not limited to, infection, bleeding, blood clot (thrombus formation), and puncture of an artery; nerve damage and irregular heartbeat and possibility to perform a PICC exchange if needed/ordered by physician.  Alternatives to this procedure were also discussed.  Bard Power PICC patient education guide, fact sheet on infection prevention and patient information card has been provided to patient /or left at bedside.    PICC Placement Documentation  PICC Single Lumen 90/30/09 Right Basilic 38 cm 2 cm (Active)  Indication for Insertion or Continuance of Line Home intravenous therapies (PICC only) 02/11/22 1021  Exposed Catheter (cm) 2 cm 02/11/22 1021  Site Assessment Clean, Dry, Intact 02/11/22 1021  Line Status Flushed;Saline locked;Blood return noted 02/11/22 1021  Dressing Type Transparent;Securing device 02/11/22 1021  Dressing Status Antimicrobial disc in place 02/11/22 1021  Safety Lock Not Applicable 23/30/07 6226  Dressing Change Due 02/18/22 02/11/22 Leslie 02/11/2022, 10:25 AM

## 2022-02-11 NOTE — Progress Notes (Signed)
Patient ID: [name], female   DOB: [birth year], 66 y.o.   MRN: [id number] BP (!) 117/54 (BP Location: Left Arm)   Pulse 97   Temp 98.9 F (37.2 C)   Resp 18   Ht '4\' 10"'$  (1.473 m)   Wt 82.1 kg   SpO2 97%   BMI 37.83 kg/m  Alert and oriented moving all extremities Wound is clean, no signs of infection Discharge tomorrow

## 2022-02-11 NOTE — Discharge Summary (Signed)
Physician Discharge Summary  Patient ID: [name] MRN: [id number] DOB/AGE: 1955/05/26 66 y.o.  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 I took to her to the operating room for hardware revision. The L3 hardware had caused pedicle fractures, and both interbody cages were falling out of the disc space anteriorly. She also when admitted complained of severe pain as she had the day prior to her admission when I saw her for followup. Due to unrelenting and increasing pain in the right lower extremity her husband brought her to the hospital. Found to be febrile, confused. Improved with antibiotics. I took her to surgery and revised the hardware. Continued with right lower extremity pain, due to infection I believe in the psoas. Post op has participated with PT, and OT. Pain I believe slowed her down. Will be receiving antibiotics for 8 weeks IV. Transferring to a SNF.  Consults: ID  Significant Diagnostic Studies: radiology: X-Ray: lumbar, MRI: lumbar, and CT scan: lumbar spine  Treatments: antibiotics: ceftriaxone, therapies: PT and OT, and surgery: Removal L3 pedicle screws, removal L3/4 cages Placement L2 pedicle screws(nuvasive) Replacement S1 pedicle screws with 8.3m x 368mscrews x2 Segmental pedicle fixation L2-S1 Posterior arthrodesis L3 to S1  Discharge Exam: Blood pressure (!) 117/54, pulse  97, temperature 98.9 F (37.2 C), resp. rate 18, height 4' 10"  (1.473 m), weight 82.1 kg, SpO2 97 %. General appearance: alert, cooperative, and moderate distress Neurologic: Alert and oriented X 3, normal strength and tone. Normal symmetric reflexes. Normal coordination and gait  Disposition: Discharge disposition: 03-Skilled [facility]     Discharge Instructions     [facility] pharmacist to adjust dose for Vancomycin, Aminoglycosides and other anti-infective therapies as requested by physician.   Complete by: As directed    [facility] to provide Cath Flo 45m73m Complete by: As directed    Administer for PICC line occlusion and as ordered by physician for other access device issues.   Anaphylaxis Kit: Provided to treat any anaphylactic reaction to the medication being provided to the patient if First Dose or when requested by physician   Complete by: As directed    Epinephrine 1mg98m vial / amp: Administer 0.3mg 23m3ml) 56mcutaneously once for moderate to severe anaphylaxis, nurse to call physician and pharmacy when reaction occurs and call 911 if needed for immediate care   Diphenhydramine 50mg/m4m vial: Administer 25-50mg IV74mPRN for first dose reaction, rash, itching, mild reaction, nurse to call physician and pharmacy when reaction occurs   Sodium Chloride 0.9% NS 500ml IV:34minister if needed for hypovolemic blood pressure drop or as ordered by physician after call to physician with anaphylactic reaction   Change dressing on IV access line weekly and PRN   Complete by: As directed    Flush IV access with Sodium Chloride 0.9% and Heparin 10 units/ml or 100 units/ml   Complete by: As directed    Home infusion instructions -  [facility]   Complete by: As directed    Instructions: Flush IV access with Sodium Chloride 0.9% and Heparin 10units/ml or 100units/ml   Change dressing on IV access line: Weekly and PRN   Instructions Cath Flo  53m: Administer for PICC Line occlusion and as ordered by physician for other access device   [facility] pharmacist to adjust dose for: Vancomycin, Aminoglycosides and other anti-infective therapies as requested by physician   Method of administration may be changed at the discretion of home infusion pharmacist based upon assessment of the patient and/or caregiver's ability to self-administer the medication ordered   Complete by: As directed       Allergies as of 02/11/2022       Reactions   Crestor [rosuvastatin] Other (See Comments)   Myalgia    Robaxin [methocarbamol] Other (See Comments)   Insomnia   Toradol [ketorolac Tromethamine] Other (See Comments)   Zocor [simvastatin] Other (See Comments)   Myalgias    Lipitor [atorvastatin] Other (See Comments)   Myalgia   Sulfa Antibiotics Hives        Medication List     STOP taking these medications    diclofenac 75 MG EC tablet Commonly known as: VOLTAREN       TAKE these medications    acetaminophen 500 MG tablet Commonly known as: TYLENOL Take 1,000 mg by mouth at bedtime as needed for mild pain or headache.   albuterol 108 (90 Base) MCG/ACT inhaler Commonly known as: ProAir HFA Inhale 2 puffs into the lungs every 6 (six) hours as needed.   amLODipine 10 MG tablet Commonly known as: NORVASC Take 1 tablet (10 mg total) by mouth daily.   cefTRIAXone  IVPB Commonly known as: ROCEPHIN Inject 2 g into the vein daily. Indication:  Proteus post-op lumbar wound infection  First Dose: Yes Last Day of Therapy:  04/02/22 Labs - Once weekly:  CBC/D and BMP, Labs - Every other week:  ESR and CRP Method of administration: IV Push Method of administration may be changed at the discretion of home infusion pharmacist based upon assessment of the patient and/or caregiver's ability to self-administer the medication ordered.   celecoxib 200 MG capsule Commonly known as: CELEBREX Take 200 mg by mouth 2 (two) times  daily.   cetirizine 10 MG tablet Commonly known as: ZYRTEC Take 1 tablet (10 mg total) by mouth daily.   cyanocobalamin 1000 MCG tablet Commonly known as: VITAMIN B12 Take 1,000 mcg by mouth daily.   docusate sodium 100 MG capsule Commonly known as: COLACE Take 100 mg by mouth 2 (two) times daily.   Ferrex 150 150 MG capsule Generic drug: iron polysaccharides TAKE 1 CAPSULE (150 MG DOSE) BY MOUTH TWICE A DAY What changed:  how much to take how to take this when to take this   gabapentin 600 MG tablet Commonly known as: NEURONTIN Take 600 mg by mouth 3 (three) times daily.   losartan-hydrochlorothiazide 100-25 MG tablet Commonly known as: HYZAAR Take 1 tablet by mouth daily.   metFORMIN 1000 MG tablet Commonly known as: GLUCOPHAGE Take 1 tablet (1,000 mg total) by mouth 2 (two) times daily with a meal.   metoprolol succinate 100 MG 24 hr tablet Commonly known as: TOPROL-XL Take 1 tablet (100 mg total) by mouth daily. What changed: when to take this   oxyCODONE 5 MG immediate release tablet Commonly known as: Oxy IR/ROXICODONE Take 1 tablet (5 mg total) by mouth every 6 (six) hours as  needed for moderate pain ((score 4 to 6)). What changed: Another medication with the same name was added. Make sure you understand how and when to take each.   oxyCODONE 10 mg 12 hr tablet Commonly known as: OXYCONTIN Take 1 tablet (10 mg total) by mouth every 12 (twelve) hours for 7 days. What changed: You were already taking a medication with the same name, and this prescription was added. Make sure you understand how and when to take each.   oxyCODONE 5 MG immediate release tablet Commonly known as: Oxy IR/ROXICODONE Take 1 tablet (5 mg total) by mouth every 6 (six) hours as needed for moderate pain. What changed: You were already taking a medication with the same name, and this prescription was added. Make sure you understand how and when to take each.   pantoprazole 40 MG  tablet Commonly known as: PROTONIX Take 40 mg by mouth daily.   tiZANidine 4 MG tablet Commonly known as: ZANAFLEX Take 4 mg by mouth at bedtime as needed for muscle spasms.               Discharge Care Instructions  (From admission, onward)           Start     Ordered   02/11/22 0000  Change dressing on IV access line weekly and PRN  (Home infusion instructions - [facility] )        02/11/22 2146            Contact information for follow-up providers     Chesley Noon, MD Follow up.   Specialty: Family Medicine Contact information: [place] [address] [phone]         Josue Hector, MD .   Specialty: Cardiology Contact information: [phone] [place] [address] [phone]         Ashok Pall, MD Follow up in 3 week(s).   Specialty: Neurosurgery Why: please call to make the appointment Contact information: [address] [place] [address] [phone]              Contact information for after-discharge care     Destination     [facility]erred [facility] .   Service: Skilled Nursing Contact information: [address] [place] [phone]                     Signed: Ashok Pall 02/11/2022, 9:56 PM

## 2022-02-11 NOTE — Discharge Instructions (Signed)

## 2022-02-12 LAB — GLUCOSE, CAPILLARY: Glucose-Capillary: 118 mg/dL — ABNORMAL HIGH (ref 70–99)

## 2022-02-12 MED ORDER — HEPARIN SOD (PORK) LOCK FLUSH 100 UNIT/ML IV SOLN
250.0000 [IU] | INTRAVENOUS | Status: AC | PRN
  Administered 2022-02-12: 250 [IU]

## 2022-02-12 NOTE — Plan of Care (Signed)
  Problem: Education: Goal: Knowledge of General Education information will improve Description: Including pain rating scale, medication(s)/side effects and non-pharmacologic comfort measures Outcome: Progressing   Problem: Health Behavior/Discharge Planning: Goal: Ability to manage health-related needs will improve Outcome: Progressing   Problem: Clinical Measurements: Goal: Ability to maintain clinical measurements within normal limits will improve Outcome: Progressing Goal: Diagnostic test results will improve Outcome: Progressing   Problem: Activity: Goal: Risk for activity intolerance will decrease Outcome: Progressing   Problem: Nutrition: Goal: Adequate nutrition will be maintained Outcome: Progressing   Problem: Activity: Goal: Ability to tolerate increased activity will improve Outcome: Progressing Goal: Will remain free from falls Outcome: Progressing

## 2022-02-12 NOTE — Progress Notes (Signed)
[facility] admission and report twice but no answer. [facility] has been contacted for transport and they should be here around 8:30 a.m.

## 2022-02-12 NOTE — TOC Transition Note (Signed)
Transition of Care [facility]) - CM/SW Discharge Note   Patient Details  Name: [name] MRN: [id number] Date of Birth: 1955-08-05  Transition of Care [facility]) CM/SW Contact:  Coralee Pesa, [place] Phone Number: 02/12/2022, 8:32 AM   Clinical Narrative:    Pt to be transported to [facility] via Corey Harold, Nurse to call report to [phone]  Final next level of care: [facility] Barriers to Discharge: SNF Pending discharge summary   Patient Goals and CMS Choice Patient states their goals for this hospitalization and ongoing recovery are:: return home CMS Medicare.gov Compare Post Acute Care list provided to:: Patient Choice offered to / list presented to : Patient  Discharge Placement              Patient chooses bed at: [facility] Patient to be transferred to facility by: [facility] Name of family member notified: Spouse Patient and family notified of of transfer: 02/11/22  Discharge Plan and Services                                     Social Determinants of Health (SDOH) Interventions     Readmission Risk Interventions     No data to display

## 2022-03-01 ENCOUNTER — Inpatient Hospital Stay: Payer: Medicare HMO | Admitting: Internal Medicine

## 2022-03-04 ENCOUNTER — Inpatient Hospital Stay: Payer: Medicare HMO | Admitting: Internal Medicine

## 2022-03-09 ENCOUNTER — Emergency Department (HOSPITAL_COMMUNITY)
Admission: EM | Admit: 2022-03-09 | Discharge: 2022-03-09 | Disposition: A | Discharge status: Home / Self Care | Payer: Medicare HMO | Attending: Emergency Medicine | Admitting: Emergency Medicine

## 2022-03-09 ENCOUNTER — Other Ambulatory Visit: Payer: Self-pay

## 2022-03-09 ENCOUNTER — Emergency Department (HOSPITAL_COMMUNITY): Payer: Medicare HMO

## 2022-03-09 ENCOUNTER — Telehealth: Payer: Self-pay

## 2022-03-09 ENCOUNTER — Encounter (HOSPITAL_COMMUNITY): Payer: Self-pay

## 2022-03-09 ENCOUNTER — Emergency Department: Payer: Self-pay

## 2022-03-09 DIAGNOSIS — Z79899 Other long term (current) drug therapy: Secondary | ICD-10-CM | POA: Diagnosis not present

## 2022-03-09 DIAGNOSIS — E119 Type 2 diabetes mellitus without complications: Secondary | ICD-10-CM | POA: Diagnosis not present

## 2022-03-09 DIAGNOSIS — K219 Gastro-esophageal reflux disease without esophagitis: Secondary | ICD-10-CM | POA: Diagnosis not present

## 2022-03-09 DIAGNOSIS — Z7984 Long term (current) use of oral hypoglycemic drugs: Secondary | ICD-10-CM | POA: Diagnosis not present

## 2022-03-09 DIAGNOSIS — E785 Hyperlipidemia, unspecified: Secondary | ICD-10-CM | POA: Diagnosis not present

## 2022-03-09 DIAGNOSIS — I10 Essential (primary) hypertension: Secondary | ICD-10-CM | POA: Insufficient documentation

## 2022-03-09 DIAGNOSIS — I350 Nonrheumatic aortic (valve) stenosis: Secondary | ICD-10-CM | POA: Insufficient documentation

## 2022-03-09 DIAGNOSIS — T82594A Other mechanical complication of infusion catheter, initial encounter: Secondary | ICD-10-CM | POA: Diagnosis present

## 2022-03-09 DIAGNOSIS — Y848 Other medical procedures as the cause of abnormal reaction of the patient, or of later complication, without mention of misadventure at the time of the procedure: Secondary | ICD-10-CM | POA: Insufficient documentation

## 2022-03-09 DIAGNOSIS — T82898A Other specified complication of vascular prosthetic devices, implants and grafts, initial encounter: Secondary | ICD-10-CM

## 2022-03-09 MED ORDER — ALTEPLASE 2 MG IJ SOLR: 2.0000 mg | Freq: Once | INTRAMUSCULAR | Status: DC

## 2022-03-09 NOTE — Discharge Instructions (Signed)
You were seen in the emergency department for a problem with your PICC line.  Our IV team was able to exchange this for you.  Please follow-up with your normal providers, and return to the ER for any complications.

## 2022-03-09 NOTE — ED Triage Notes (Signed)
Pt arrived POV from home c/o a clogged PICC line. Pt has a PICC in her right arm and was goin to give herself an antibiotic around 04-1229 today when it would not flush. Pt denies any pain in the arm or around the PICC.

## 2022-03-09 NOTE — Telephone Encounter (Signed)
Patient's daughter called office to confirm appointment for follow up and to ask questions about antibiotics. Relayed appointment information for 10/19.  Colman Cater states that her mother was discharged from her skilled nursing facility and  was started with home health. Noticed that antibiotics were switched from drip to syringe.  Would like for office to confirm with home health that antibiotic and dose are the same. Patient has noticed nausea after infusing antibiotics from syringe.  Spoke with [facility] team who confirmed patient is on Ceftriaxone 2 g daily.  Relayed this to patient and her daughter. Would like to know if office would be able to send in something for nausea to [facility].  [facility] [place], [place]  Leatrice Jewels, RMA

## 2022-03-09 NOTE — ED Provider Triage Note (Signed)
Emergency Medicine Provider Triage Evaluation Note  [name] , a 66 y.o. female  was evaluated in triage.  Pt complains of difficulty accessing PICC line. Has it for IV antibiotics, needs it until 11/10. Starting at 1230 this afternoon she could not flush the PICC line.  She called the home nurse and they recommended that she come to the ER.  No other complaints.  Review of Systems  Cannot access PICC line  Physical Exam  BP 124/71 (BP Location: Left Arm)   Pulse (!) 110   Temp 98.6 F (37 C) (Oral)   Resp 17   Ht '4\' 10"'$  (1.473 m)   Wt 81.6 kg   SpO2 99%   BMI 37.62 kg/m  Gen:   Awake, no distress   Resp:  Normal effort  MSK:   Moves extremities without difficulty  Other:    Medical Decision Making  Medically screening exam initiated at 2:51 PM.  Appropriate orders placed.  [name] was informed that the remainder of the evaluation will be completed by another provider, this initial triage assessment does not replace that evaluation, and the importance of remaining in the ED until their evaluation is complete.  Consulted IV team to assist in PICC access   [name] T, PA-C 03/09/22 1452

## 2022-03-10 ENCOUNTER — Other Ambulatory Visit: Payer: Self-pay | Admitting: Internal Medicine

## 2022-03-10 ENCOUNTER — Other Ambulatory Visit (HOSPITAL_COMMUNITY): Payer: Self-pay

## 2022-03-10 MED ORDER — ONDANSETRON HCL 4 MG PO TABS
4.0000 mg | ORAL_TABLET | Freq: Every day | ORAL | 1 refills | Status: DC | PRN
  Filled 2022-03-10: qty 30, 30d supply, fill #0
  Filled 2022-04-26: qty 30, 30d supply, fill #1

## 2022-03-10 NOTE — Progress Notes (Signed)
zofran

## 2022-03-10 NOTE — ED Provider Notes (Signed)
[facility] EMERGENCY DEPARTMENT Provider Note   CSN: [id number] Arrival date & time: 03/09/22  1358     History  Chief Complaint  Patient presents with   Vascular Access Problem    PICC line issues    [name] is a 66 y.o. female with history of type 2 diabetes, hypertension, hyperlipidemia, GERD, aortic stenosis.  Pt complains of difficulty accessing PICC line. Has it for IV antibiotics, needs it until 11/10. Starting at 1230 this afternoon she could not flush the PICC line.  She called the home nurse and they recommended that she come to the ER.  No other complaints.   HPI     Home Medications Prior to Admission medications   Medication Sig Start Date End Date Taking? Authorizing Provider  acetaminophen (TYLENOL) 500 MG tablet Take 1,000 mg by mouth at bedtime as needed for mild pain or headache.    [provider]  albuterol (PROAIR HFA) 108 (90 BASE) MCG/ACT inhaler Inhale 2 puffs into the lungs every 6 (six) hours as needed. 07/21/13   Roselee Culver, MD  amLODipine (NORVASC) 10 MG tablet Take 1 tablet (10 mg total) by mouth daily. 11/23/17   [name], PA-C  cefTRIAXone (ROCEPHIN) IVPB Inject 2 g into the vein daily. Indication:  Proteus post-op lumbar wound infection  First Dose: Yes Last Day of Therapy:  04/02/22 Labs - Once weekly:  CBC/D and BMP, Labs - Every other week:  ESR and CRP Method of administration: IV Push Method of administration may be changed at the discretion of home infusion pharmacist based upon assessment of the patient and/or caregiver's ability to self-administer the medication ordered. 02/11/22 04/05/22  Ashok Pall, MD  celecoxib (CELEBREX) 200 MG capsule Take 200 mg by mouth 2 (two) times daily.  10/21/17   [provider]  cetirizine (ZYRTEC) 10 MG tablet Take 1 tablet (10 mg total) by mouth daily. 05/06/17   [name], PA-C  cyanocobalamin (VITAMIN B12) 1000 MCG tablet Take 1,000 mcg by  mouth daily.    [provider]  docusate sodium (COLACE) 100 MG capsule Take 100 mg by mouth 2 (two) times daily.    [provider]  gabapentin (NEURONTIN) 600 MG tablet Take 600 mg by mouth 3 (three) times daily. 12/01/21   [provider]  iron polysaccharides (NIFEREX) 150 MG capsule TAKE 1 CAPSULE (150 MG DOSE) BY MOUTH TWICE A DAY Patient taking differently: Take 150 mg by mouth daily. 12/19/19 04/03/22  Tomasa Hose, NP  losartan-hydrochlorothiazide (HYZAAR) 100-25 MG tablet Take 1 tablet by mouth daily. 11/23/17   [name], PA-C  metFORMIN (GLUCOPHAGE) 1000 MG tablet Take 1 tablet (1,000 mg total) by mouth 2 (two) times daily with a meal. 05/01/21     metoprolol succinate (TOPROL-XL) 100 MG 24 hr tablet Take 1 tablet (100 mg total) by mouth daily. Patient taking differently: Take 100 mg by mouth at bedtime. 02/25/21     oxyCODONE (OXY IR/ROXICODONE) 5 MG immediate release tablet Take 1 tablet (5 mg total) by mouth every 6 (six) hours as needed for moderate pain ((score 4 to 6)). 01/19/22   Ashok Pall, MD  oxyCODONE (OXY IR/ROXICODONE) 5 MG immediate release tablet Take 1 tablet (5 mg total) by mouth every 6 (six) hours as needed for moderate pain. 02/11/22   Ashok Pall, MD  pantoprazole (PROTONIX) 40 MG tablet Take 40 mg by mouth daily. 12/28/21   [provider]  tiZANidine (ZANAFLEX) 4 MG  tablet Take 4 mg by mouth at bedtime as needed for muscle spasms.    [provider]      Allergies    Crestor [rosuvastatin], Robaxin [methocarbamol], Toradol [ketorolac tromethamine], Zocor [simvastatin], Lipitor [atorvastatin], and Sulfa antibiotics    Review of Systems   Review of Systems  All other systems reviewed and are negative.   Physical Exam Updated Vital Signs BP 124/71 (BP Location: Left Arm)   Pulse (!) 110   Temp 98.6 F (37 C) (Oral)   Resp 17   Ht 4' 10"  (1.473 m)   Wt 81.6 kg   SpO2 99%   BMI 37.62 kg/m  Physical  Exam Vitals and nursing note reviewed.  Constitutional:      Appearance: Normal appearance.  HENT:     Head: Normocephalic and atraumatic.  Eyes:     Conjunctiva/sclera: Conjunctivae normal.  Pulmonary:     Effort: Pulmonary effort is normal. No respiratory distress.  Skin:    General: Skin is warm and dry.  Neurological:     Mental Status: She is alert.  Psychiatric:        Mood and Affect: Mood normal.        Behavior: Behavior normal.     ED Results / Procedures / Treatments   Labs (all labs ordered are listed, but only abnormal results are displayed) Labs Reviewed - No data to display  EKG None  Radiology [place] EKG SITE RITE  Result Date: 03/09/2022 If Site Rite image not attached, placement could not be confirmed due to current cardiac rhythm.  DG Chest 1 View  Result Date: 03/09/2022 CLINICAL DATA:  Evaluate PICC line.  PICC line placement. EXAM: CHEST  1 VIEW COMPARISON:  02/03/2022 FINDINGS: Right-sided PICC line is new since the prior exam. Difficult to follow centrally. Only confidently seen to the level of the high SVC. Patient rotated to the left. Midline trachea. Normal heart size for level of inspiration. No pleural effusion or pneumothorax. Clear lungs. IMPRESSION: PICC line is difficult to follow centrally, only well visualized to the level of the high SVC. If definitive localization is desired, consider repeat with increased exposure. No acute findings. Electronically Signed   By: Abigail Miyamoto M.D.   On: 03/09/2022 17:37    Procedures Procedures    Medications Ordered in ED Medications - No data to display  ED Course/ Medical Decision Making/ A&P                           Medical Decision Making Amount and/or Complexity of Data Reviewed Radiology: ordered.   Patient is a 66 year old female with history of type 2 diabetes, hypertension, hyperlipidemia, GERD, aortic stenosis who presents the emergency department with concern for accessing PICC  line.  On exam patient has normal vital signs and is in no acute distress.  IV team was consulted and they evaluated the patient.  After performing their exam and a chest x-ray, they deemed that she needed a PICC line exchange.  This was performed, and patient tolerated procedure without immediate complications.  Patient discharged in stable condition with recommendation to follow-up with her primary doctor, in regards to the PICC exchange and continuing IV antibiotics.  Final Clinical Impression(s) / ED Diagnoses Final diagnoses:  Occlusion of peripherally inserted central catheter (PICC) line, initial encounter ([facility])    Rx / DC Orders ED Discharge Orders     None      Portions of this  report may have been transcribed using voice recognition software. Every effort was made to ensure accuracy; however, inadvertent computerized transcription errors may be present.    Estill Cotta 03/10/22 9774    Davonna Belling, MD 03/13/22 [phone]

## 2022-03-10 NOTE — Telephone Encounter (Signed)
Called patient to update her that Zofran was sent to pharmacy. Is okay with picking it up from [facility].  Did not have any questions at this time. Leatrice Jewels, RMA

## 2022-03-11 ENCOUNTER — Ambulatory Visit: Payer: Medicare HMO | Admitting: Internal Medicine

## 2022-03-11 ENCOUNTER — Other Ambulatory Visit: Payer: Self-pay

## 2022-03-11 ENCOUNTER — Encounter: Payer: Self-pay | Admitting: Internal Medicine

## 2022-03-11 VITALS — BP 138/81 | HR 112 | Temp 97.7°F | Wt 176.0 lb

## 2022-03-11 DIAGNOSIS — T8140XS Infection following a procedure, unspecified, sequela: Secondary | ICD-10-CM

## 2022-03-11 DIAGNOSIS — T8140XD Infection following a procedure, unspecified, subsequent encounter: Secondary | ICD-10-CM

## 2022-03-11 NOTE — Progress Notes (Signed)
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 ([place]) 02/05/2022   Bacteremia due to Proteus species    Sepsis ([place]) 02/03/2022   Iron deficiency anemia 02/03/2022   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   S/P lumbar spinal fusion 11/17/2021   Lumbar radiculopathy 04/21/2021   Spondylolisthesis of lumbar region 02/01/2018   Gastroesophageal reflux disease without esophagitis 01/16/2018   Osteoarthritis of left hip 04/07/2014   BMI 40.0-44.9, adult ([place]) 04/07/2014   DM type 2 (diabetes mellitus, type 2) (HCC)    HTN (hypertension)    Hypercholesterolemia    Reflux      Subjective:  [name] is a 66 y.o. PMHx as below presents with hospital f/u.  She was admitted to [facility] 9/13 - 9/22 for lumbar infection.  She had undergone L4-L5, L5-S1 fusion on 2019, on 01/13/2022 underwent L3-L4 fusion and removal of L4 and S1 pedicle screws, placement of L3-L4 screws.  She reported that drain came out of her wound about a week prior to admission.  She had worsening pain which led to her presentation.  Blood cultures on admission grew 1/2 Proteus mirabilis.  On 9/13 MRI lumbar spine showed concern for intra and ectraspinal infection at the level spinal fusion hardware.  Abscess likely extending to epidural space at L3-L4 vertebral body where there is epidural phlegmon, concern for L3/L4 osteomyelitis.  She underwent I&D with hardware removal cultures growing Proteus mirabilis on 9/15 with Dr. Christella Noa.  ID was engaged and she was transitioned from broad-spectrum antibiotics vancomycin, cefepime, metronidazole to ceftriaxone(OR Proteus cefazolin MIC 8 as such will switch to ceftriaxone) x 8 weeks from OR EOT  11/10.  Since discharge she reports that she has not seen Dr. Christella Noa as she missed her last appointment.  She has no concerns in regards to the wound.  She reports 100% adherence to antibiotics, denies fevers and chills.  She does note that her tongue has been burning  when consuming spicy foods and drinking sodas.  She reports that started with antibiotics. Review of Systems  All other systems reviewed and are negative.   Past Medical History:  Diagnosis Date   Allergy    Anemia    Anxiety    Aortic stenosis    moderate AS by echo 12/2021   Arthritis    Asthma    BMI 40.0-44.9, adult ([place]) 04/07/2014   DM type 2 (diabetes mellitus, type 2) (HCC)    Dyspnea    GERD (gastroesophageal reflux disease)    Heart murmur    HTN (hypertension)    Hypercholesterolemia    Osteoarthritis of left hip 04/07/2014   Reflux    Spinal headache    with C-Section and with spinal fusion in 2019    Outpatient Medications Prior to Visit  Medication Sig Dispense Refill   acetaminophen (TYLENOL) 500 MG tablet Take 1,000 mg by mouth at bedtime as needed for mild pain or headache.     albuterol (PROAIR HFA) 108 (90 BASE) MCG/ACT inhaler Inhale 2 puffs into the lungs every 6 (six) hours as needed. 1 Inhaler 12   amLODipine (NORVASC) 10 MG tablet Take 1 tablet (10 mg total) by  mouth daily. 90 tablet 1   cefTRIAXone (ROCEPHIN) IVPB Inject 2 g into the vein daily. Indication:  Proteus post-op lumbar wound infection  First Dose: Yes Last Day of Therapy:  04/02/22 Labs - Once weekly:  CBC/D and BMP, Labs - Every other week:  ESR and CRP Method of administration: IV Push Method of administration may be changed at the discretion of home infusion pharmacist based upon assessment of the patient and/or caregiver's ability to self-administer the medication ordered. 53 Units 0   celecoxib (CELEBREX) 200 MG capsule Take 200 mg by mouth 2 (two) times daily.   1   cetirizine (ZYRTEC) 10 MG tablet Take 1 tablet (10 mg  total) by mouth daily. 30 tablet 11   cyanocobalamin (VITAMIN B12) 1000 MCG tablet Take 1,000 mcg by mouth daily.     docusate sodium (COLACE) 100 MG capsule Take 100 mg by mouth 2 (two) times daily.     gabapentin (NEURONTIN) 600 MG tablet Take 600 mg by mouth 3 (three) times daily.     iron polysaccharides (NIFEREX) 150 MG capsule TAKE 1 CAPSULE (150 MG DOSE) BY MOUTH TWICE A DAY (Patient taking differently: Take 150 mg by mouth daily.) 180 capsule 1   losartan-hydrochlorothiazide (HYZAAR) 100-25 MG tablet Take 1 tablet by mouth daily. 90 tablet 1   metFORMIN (GLUCOPHAGE) 1000 MG tablet Take 1 tablet (1,000 mg total) by mouth 2 (two) times daily with a meal. 180 tablet 1   metoprolol succinate (TOPROL-XL) 100 MG 24 hr tablet Take 1 tablet (100 mg total) by mouth daily. (Patient taking differently: Take 100 mg by mouth at bedtime.) 90 tablet 1   ondansetron (ZOFRAN) 4 MG tablet Take 1 tablet (4 mg total) by mouth daily as needed for nausea or vomiting. Take 30 minutes prior to IV antibiotic. 30 tablet 1   oxyCODONE (OXY IR/ROXICODONE) 5 MG immediate release tablet Take 1 tablet (5 mg total) by mouth every 6 (six) hours as needed for moderate pain ((score 4 to 6)). 30 tablet 0   oxyCODONE (OXY IR/ROXICODONE) 5 MG immediate release tablet Take 1 tablet (5 mg total) by mouth every 6 (six) hours as needed for moderate pain. 30 tablet 0   pantoprazole (PROTONIX) 40 MG tablet Take 40 mg by mouth daily.     tiZANidine (ZANAFLEX) 4 MG tablet Take 4 mg by mouth at bedtime as needed for muscle spasms.     No facility-administered medications prior to visit.     Allergies  Allergen Reactions   Crestor [Rosuvastatin] Other (See Comments)    Myalgia    Robaxin [Methocarbamol] Other (See Comments)    Insomnia   Toradol [Ketorolac Tromethamine] Other (See Comments)   Zocor [Simvastatin] Other (See Comments)    Myalgias    Lipitor [Atorvastatin] Other (See Comments)    Myalgia   Sulfa Antibiotics Hives     Social History   Tobacco Use   Smoking status: Never   Smokeless tobacco: Never  Vaping Use   Vaping Use: Never used  Substance Use Topics   Alcohol use: No    Alcohol/week: 0.0 standard drinks of alcohol   Drug use: No    Family History  Problem Relation Age of Onset   Hypertension Mother    Stroke Mother    Heart disease Father    Stroke Brother    Multiple sclerosis Brother    Multiple sclerosis Sister    Colon cancer Neg Hx    Colon polyps Neg Hx  Esophageal cancer Neg Hx    Rectal cancer Neg Hx    Stomach cancer Neg Hx     Objective:   Vitals:   03/11/22 1517  BP: 138/81  Pulse: (!) 112  Temp: 97.7 F (36.5 C)  TempSrc: Oral  SpO2: 100%  Weight: 176 lb (79.8 kg)   Body mass index is 36.78 kg/m.  Physical Exam Constitutional:      Appearance: Normal appearance.  HENT:     Head: Normocephalic and atraumatic.     Right Ear: Tympanic membrane normal.     Left Ear: Tympanic membrane normal.     Nose: Nose normal.     Mouth/Throat:     Mouth: Mucous membranes are moist.  Eyes:     Extraocular Movements: Extraocular movements intact.     Conjunctiva/sclera: Conjunctivae normal.     Pupils: Pupils are equal, round, and reactive to light.  Cardiovascular:     Rate and Rhythm: Normal rate and regular rhythm.     Heart sounds: No murmur heard.    No friction rub. No gallop.  Pulmonary:     Effort: Pulmonary effort is normal.     Breath sounds: Normal breath sounds.  Abdominal:     General: Abdomen is flat.     Palpations: Abdomen is soft.  Musculoskeletal:        General: Normal range of motion.  Skin:    General: Skin is warm and dry.  Neurological:     General: No focal deficit present.     Mental Status: She is alert and oriented to person, place, and time.  Psychiatric:        Mood and Affect: Mood normal.        Lab Results: Lab Results  Component Value Date   WBC 11.4 (H) 02/10/2022   HGB 8.5 (L) 02/10/2022   HCT 26.8 (L)  02/10/2022   MCV 77.5 (L) 02/10/2022   PLT 414 (H) 02/10/2022    Lab Results  Component Value Date   CREATININE 0.87 02/08/2022   BUN 8 02/08/2022   NA 138 02/08/2022   K 3.5 02/08/2022   CL 99 02/08/2022   CO2 29 02/08/2022    Lab Results  Component Value Date   ALT 12 02/03/2022   AST 16 02/03/2022   ALKPHOS 71 02/03/2022   BILITOT 0.6 02/03/2022     Assessment & Plan:  #Post-op lumbar infection SP I&D and HW removal with Cx+ Proteus mirabilis  on 9/15 #Psoas abscess #Proteus mirabilis bacteremia #Tongue burning -9/13 blood cultures 1/2 Proteus mirabilis.  Initially started on vancomycin, cefepime, metronidazole. -MRI L-spine showed epidural abscess extending anteriorly from soft tissue to fusion hardware L3-L4. 1.1x0.8 cm focal area in right psoas muscle suspicious for small abscess - Underwent I&D with hardware removal on 9/15 with neurosurgery Dr. Christella Noa.  Of note all the old hardware was removed new pedicle screws were placed.  ID was engaged and plan for 8 weeks of antibiotics more.  OR cultures grew Proteus mirabilis with cefazolin MIC 8  as such switched  cefazolin to ceftriaxone.   -Pt sees [facility] , Dr. Cyndy Freeze on Monday for first Post-op visit -Labs from 10/16: wbc 12.3, Scr 0.96, ESR 106(116 on 10/13), crp 185(231 on 10/13) -Pt reports tongue"burning" while eating spicy foods/drinking soda after antibiotics started. Glossitis is a possible adverse effect of ceftriaxone. I examined her tongue and there were no signs of inflammation. With that said , her symptoms could be attributed to ceftriaxone, as  it is a symptom she can tolerate we will continue antibiotics. Plan: -Continue ceftriaxone to complete 8 weeks of antibiotics EOT 04/02/22 -Will order MRI L spine as pt had a pretty extensive infection with new HW/small psoas abscess. -F/U in ID clinic on 11/10 with Dr. Gale Journey for END of IV treatment visit with MRI -After completion of IV antibiotics start cefadroxil 500 mg PO  bid( old  hardware was removed, pedicle screws were placed in OR and S1 screws replaced).  Anticipate at least 6 to 12 months of suppressive antibiotics. -Monitor for any side effects.  I have personally spent 67 minutes involved in face-to-face and non-face-to-face activities for this patient on the day of the visit. Professional time spent includes the following activities: Preparing to see the patient (review of tests), Obtaining and/or reviewing separately obtained history (admission/discharge record), Performing a medically appropriate examination and/or evaluation , Ordering medications/tests/procedures, referring and communicating with other health care professionals, Documenting clinical information in the EMR, Independently interpreting results (not separately reported), Communicating results to the patient/family/caregiver, Counseling and educating the patient/family/caregiver and Care coordination (not separately reported).   Laurice Record, MD [place] [facility]   03/11/22  3:21 PM

## 2022-03-15 ENCOUNTER — Telehealth: Payer: Self-pay

## 2022-03-15 NOTE — Telephone Encounter (Signed)
Called and rescheduled patient per message from Dr. Candiss Norse. Patient has an MRI scheduled at [facility] on 03/31/22. Moved follow-up appointment with Dr. Gale Journey to 04/07/22.  Per verbal order from Dr. Candiss Norse, extend IV antibiotics until 04/07/22. Epic community message sent to [employer].  Patient stated understanding and confirmed all upcoming appointments. All questions answered.  Routed to [place].  Binnie Kand, RN

## 2022-03-31 ENCOUNTER — Ambulatory Visit (HOSPITAL_COMMUNITY)
Admission: RE | Admit: 2022-03-31 | Discharge: 2022-03-31 | Disposition: A | Discharge status: Home / Self Care | Payer: Medicare HMO | Source: Ambulatory Visit | Attending: Internal Medicine | Admitting: Internal Medicine

## 2022-03-31 DIAGNOSIS — T8140XS Infection following a procedure, unspecified, sequela: Secondary | ICD-10-CM | POA: Insufficient documentation

## 2022-03-31 MED ORDER — GADOBUTROL 1 MMOL/ML IV SOLN
8.0000 mL | Freq: Once | INTRAVENOUS | Status: AC | PRN
  Administered 2022-03-31: 8 mL via INTRAVENOUS

## 2022-04-02 ENCOUNTER — Ambulatory Visit: Payer: Medicare HMO | Admitting: Internal Medicine

## 2022-04-04 ENCOUNTER — Telehealth: Payer: Self-pay | Admitting: Internal Medicine

## 2022-04-04 NOTE — Telephone Encounter (Signed)
Patient called saying she is supposed to keep abtx through Wednesday when she is going to have picc line pulled but her picc line has become significantly dislogded" pulled out.   Will arrange to have patient remove picc line.

## 2022-04-05 ENCOUNTER — Telehealth: Payer: Self-pay

## 2022-04-05 MED ORDER — CEFADROXIL 1 G PO TABS: 1.0000 g | ORAL_TABLET | Freq: Two times a day (BID) | ORAL | 0 refills | Status: DC

## 2022-04-05 NOTE — Telephone Encounter (Signed)
It is ok to start cefadroxil 1 gram twice daily  Picc can be removed  Thanks

## 2022-04-05 NOTE — Telephone Encounter (Signed)
Thanks

## 2022-04-05 NOTE — Telephone Encounter (Signed)
Called and spoke with patient per request from Dr. Baxter Flattery.  Patient states she attempted to give herself a dose of her IV medication on Sunday at approximately 1 pm. Was unable to give dose, and discovered the PICC line had migrated out of her arm.  Patient states she is feeling fine. Denies pain, shortness of breath, and palpitations. States her hand is warm and denies numbness/tingling.  States her home health RN is scheduled to come out and see her today at 11am.  Confirmed follow up appointment at [facility] on 11/15.  Routed to providers and [facility] pharmacists. Epic community message sent to [employer] with update.  Binnie Kand, RN

## 2022-04-05 NOTE — Telephone Encounter (Signed)
[facility] staff aware PICC line will stay out. RX for Cefadroxil 1 gram BID sent to pharmacy. Patient aware and verbalized her understanding.     [place], CMA

## 2022-04-05 NOTE — Telephone Encounter (Signed)
Error

## 2022-04-06 ENCOUNTER — Other Ambulatory Visit (HOSPITAL_COMMUNITY): Payer: Self-pay

## 2022-04-07 ENCOUNTER — Other Ambulatory Visit: Payer: Self-pay

## 2022-04-07 ENCOUNTER — Encounter: Payer: Self-pay | Admitting: Internal Medicine

## 2022-04-07 ENCOUNTER — Ambulatory Visit (INDEPENDENT_AMBULATORY_CARE_PROVIDER_SITE_OTHER): Payer: Medicare HMO | Admitting: Internal Medicine

## 2022-04-07 VITALS — BP 148/80 | HR 74 | Temp 97.9°F | Ht <= 58 in | Wt 173.0 lb

## 2022-04-07 DIAGNOSIS — M8668 Other chronic osteomyelitis, other site: Secondary | ICD-10-CM

## 2022-04-07 DIAGNOSIS — T847XXD Infection and inflammatory reaction due to other internal orthopedic prosthetic devices, implants and grafts, subsequent encounter: Secondary | ICD-10-CM

## 2022-04-07 MED ORDER — SULFAMETHOXAZOLE-TRIMETHOPRIM 400-80 MG PO TABS: 1.0000 | ORAL_TABLET | Freq: Two times a day (BID) | ORAL | 11 refills | Status: DC

## 2022-04-07 NOTE — Patient Instructions (Signed)
Start bactrim 1 tablet twice a day  If any rash, breathing issue let me know immediately  See me in 3 months  F/u nsg to remove stitch  Labs today

## 2022-04-07 NOTE — Progress Notes (Signed)
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 ([place]) 02/05/2022   Bacteremia due to Proteus species    Sepsis ([place]) 02/03/2022   Iron deficiency anemia 02/03/2022   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   S/P lumbar spinal fusion 11/17/2021   Lumbar radiculopathy 04/21/2021   Spondylolisthesis of lumbar region 02/01/2018   Gastroesophageal reflux disease without esophagitis 01/16/2018   Osteoarthritis of left hip 04/07/2014   BMI 40.0-44.9, adult ([place]) 04/07/2014   DM type 2 (diabetes mellitus, type 2) (HCC)    HTN (hypertension)    Hypercholesterolemia    Reflux       Subjective:    Patient ID: [name], female    DOB: June 14, 1955, 66 y.o.   MRN: [id number]  Chief Complaint  Patient presents with   Follow-up    Has not been able to get cefadroxil from the pharmacy    HPI:  [name] is a 66 y.o. female here for f/u lumbar hardware associated spine infection  She had associated bacteremia proteus mirabilis blood stream infection 02/03/2022 S/p I&D with all old hardware removed but new screws placed on 9/15  The plan was to do 8 weeks iv abx until 11/10 which she had done (ceftriaxone; cefazolin mic is high at 8)  I discussed with our id pharmacist and will not use cefadroxil. Will try bactrim ss bid for now   She walks with fww. She is sore in lower back bilaterally when moving but nothing in the middle. This is chronic. 7 at worst; 2/10 resting   No fever, chill, diarrhea    Allergies  Allergen Reactions   Crestor [Rosuvastatin] Other (See Comments)    Myalgia    Robaxin [Methocarbamol] Other (See Comments)    Insomnia   Toradol [Ketorolac Tromethamine] Other (See Comments)   Zocor [Simvastatin] Other (See Comments)    Myalgias    Lipitor [Atorvastatin] Other (See Comments)    Myalgia   Sulfa Antibiotics Hives       Outpatient Medications Prior to Visit  Medication Sig Dispense Refill   acetaminophen (TYLENOL) 500 MG tablet Take 1,000 mg by mouth at bedtime as needed for mild pain or headache.     albuterol (PROAIR HFA) 108 (90 BASE) MCG/ACT inhaler Inhale 2 puffs into the lungs every 6 (six) hours as needed. 1 Inhaler 12   amLODipine (NORVASC) 10 MG tablet Take 1 tablet (10 mg total) by mouth daily. 90 tablet 1   celecoxib (CELEBREX) 200 MG capsule Take 200 mg by mouth 2 (two) times daily.   1   cetirizine (ZYRTEC) 10 MG tablet Take 1 tablet (10 mg total) by mouth daily. 30 tablet 11   docusate sodium (COLACE) 100 MG capsule Take 100 mg by mouth 2 (two) times daily.     gabapentin (NEURONTIN) 600 MG tablet Take 600 mg by mouth 3 (three) times daily.     iron polysaccharides (NIFEREX) 150 MG capsule TAKE 1 CAPSULE (150 MG DOSE) BY MOUTH TWICE A DAY (Patient taking differently: Take 150 mg by mouth daily.) 180 capsule 1   losartan-hydrochlorothiazide (HYZAAR) 100-25 MG tablet Take 1 tablet by mouth daily. 90 tablet 1   metFORMIN (GLUCOPHAGE) 1000 MG tablet Take 1 tablet (1,000 mg total) by mouth 2 (two) times daily with a meal. 180 tablet 1   metoprolol succinate (TOPROL-XL) 100 MG 24 hr  tablet Take 1 tablet (100 mg total) by mouth daily. (Patient taking differently: Take 100 mg by mouth at bedtime.) 90 tablet 1   ondansetron (ZOFRAN) 4 MG tablet Take 1 tablet (4 mg total) by mouth daily as needed for nausea or vomiting. Take 30 minutes prior to IV antibiotic. 30 tablet 1   oxyCODONE (OXY IR/ROXICODONE) 5 MG immediate release tablet Take 1 tablet (5 mg total) by mouth every 6 (six) hours as needed for moderate pain. 30 tablet 0   pantoprazole (PROTONIX) 40 MG tablet Take 40 mg by mouth daily.     cefadroxil (DURICEF) 1 g tablet Take 1 tablet (1 g total) by mouth 2 (two) times daily. (Patient not taking: Reported on 04/07/2022) 60 tablet 0   cyanocobalamin (VITAMIN B12) 1000 MCG tablet Take 1,000 mcg by  mouth daily. (Patient not taking: Reported on 04/07/2022)     oxyCODONE (OXY IR/ROXICODONE) 5 MG immediate release tablet Take 1 tablet (5 mg total) by mouth every 6 (six) hours as needed for moderate pain ((score 4 to 6)). 30 tablet 0   tiZANidine (ZANAFLEX) 4 MG tablet Take 4 mg by mouth at bedtime as needed for muscle spasms. (Patient not taking: Reported on 04/07/2022)     No facility-administered medications prior to visit.     Social History   Socioeconomic History   Marital status: Married    Spouse name: Not on file   Number of children: Not on file   Years of education: Not on file   Highest education level: Not on file  Occupational History   Not on file  Tobacco Use   Smoking status: Never   Smokeless tobacco: Never  Vaping Use   Vaping Use: Never used  Substance and Sexual Activity   Alcohol use: No    Alcohol/week: 0.0 standard drinks of alcohol   Drug use: No   Sexual activity: Yes  Other Topics Concern   Not on file  Social History Narrative   Not on file   Social Determinants of Health   Financial Resource Strain: Not on file  Food Insecurity: No Food Insecurity (02/03/2022)   Hunger Vital Sign    Worried About Running Out of Food in the Last Year: Never true    Ran Out of Food in the Last Year: Never true  Transportation Needs: No Transportation Needs (02/03/2022)   PRAPARE - Hydrologist (Medical): No    Lack of Transportation (Non-Medical): No  Physical Activity: Not on file  Stress: Not on file  Social Connections: Not on file  Intimate Partner Violence: Not At Risk (02/03/2022)   Humiliation, Afraid, Rape, and Kick questionnaire    Fear of Current or Ex-Partner: No    Emotionally Abused: No    Physically Abused: No    Sexually Abused: No      Review of Systems     Objective:    BP (!) 148/80   Pulse 74   Temp 97.9 F (36.6 C) (Oral)   Ht _0  (1.473 m)   Wt 173 lb (78.5 kg)   SpO2 100%   BMI 36.16  kg/m  Nursing note and vital signs reviewed.  Physical Exam     General/constitutional: no distress, pleasant HEENT: Normocephalic, PER, Conj Clear, EOMI, Oropharynx clear Neck supple CV: rrr no mrg Lungs: clear to auscultation, normal respiratory effort Abd: Soft, Nontender Ext: no edema Skin: No Rash Neuro: nonfocal MSK: midline scar all healed stitches still there; keloid  changes top of incision; no fluctuance/tenderness  Labs: Reviewed opat labs previously  Micro:  Serology:  Imaging:  Assessment & Plan:   Problem List Items Addressed This Visit   None Visit Diagnoses     Hardware complicating wound infection, subsequent encounter    -  Primary   Chronic osteomyelitis of lumbar spine ([place])             No orders of the defined types were placed in this encounter.    #Post-op lumbar infection SP I&D and HW removal with Cx+ Proteus mirabilis  on 9/15 #Psoas abscess #Proteus mirabilis bacteremia #Tongue burning -9/13 blood cultures 1/2 Proteus mirabilis.  Initially started on vancomycin, cefepime, metronidazole. -MRI L-spine showed epidural abscess extending anteriorly from soft tissue to fusion hardware L3-L4. 1.1x0.8 cm focal area in right psoas muscle suspicious for small abscess - Underwent I&D with hardware removal on 9/15 with neurosurgery Dr. Christella Noa.  Of note all the old hardware was removed new pedicle screws were placed.  ID was engaged and plan for 8 weeks of antibiotics more.  OR cultures grew Proteus mirabilis with cefazolin MIC 8  as such switched  cefazolin to ceftriaxone.   -Pt sees NSY , Dr. Cyndy Freeze on Monday for first Post-op visit -Labs from 10/16: wbc 12.3, Scr 0.96, ESR 106(116 on 10/13), crp 185(231 on 10/13) -Pt reports tongue"burning" while eating spicy foods/drinking soda after antibiotics started. Glossitis is a possible adverse effect of ceftriaxone. I examined her tongue and there were no signs of inflammation. With that said , her  symptoms could be attributed to ceftriaxone, as it is a symptom she can tolerate we will continue antibiotics.  -finished ceftriaxone on 11/10  -due to high cefazolin mic, will use bactrim for maintenace phase -discuss at least 6 more months treatment if not indefinitely -labs today -chart allergy sulfa abx but patient doesn't recall. Will try bactrim and see; precaution discussed -f/u in 4 weeks   Follow-up: Return in about 4 weeks (around 05/05/2022).      Jabier Mutton, [place] for [facility] 04/07/2022, 3:16 PM

## 2022-04-08 ENCOUNTER — Telehealth: Payer: Self-pay

## 2022-04-08 NOTE — Telephone Encounter (Signed)
Patient called, is wondering if she should take Bactrim as she is allergic to sulfa.   Explained that Dr. Gale Journey discussed this allergy with her at her appointment yesterday and Bactrim was determined to be low-risk. She states she does not recall this conversation and reports that she gets welts as well as facial and lip swelling when she takes sulfa.   Advised her not to take the Bactrim until she hears back from our office about how to proceed. Patient verbalized understanding and has no further questions.   Beryle Flock, RN

## 2022-04-09 NOTE — Telephone Encounter (Signed)
[facility] Could you see if it was a particular med like lasix that she had rxm to. She denied it was bactrim    I would rather avoid cipro for her  Thanks

## 2022-04-09 NOTE — Telephone Encounter (Signed)
Discussed plan with patient and Dr. Gale Journey. She denies every taking Bactrim. Has taken Lasix and tolerated well. States she remembers taking an OTC medication (cannot recall name) that had sulfa in it and had welts all over her mouth when she had a cold. Reviewed that I cannot find any OTC medications containing sulfa to my knowledge. Denies taking sulfadiazine or sulfasalazine. With all of the information above, we all agree that trialing Bactrim is appropriate and safe. She agrees to start taking it today. Informed her to please call us with any more concerns. Estill [place]

## 2022-04-26 ENCOUNTER — Other Ambulatory Visit (HOSPITAL_COMMUNITY): Payer: Self-pay

## 2022-04-30 ENCOUNTER — Other Ambulatory Visit (HOSPITAL_COMMUNITY): Payer: Self-pay

## 2022-05-05 ENCOUNTER — Other Ambulatory Visit: Payer: Self-pay

## 2022-05-05 ENCOUNTER — Other Ambulatory Visit (HOSPITAL_COMMUNITY): Payer: Self-pay

## 2022-05-05 ENCOUNTER — Encounter: Payer: Self-pay | Admitting: Internal Medicine

## 2022-05-05 ENCOUNTER — Ambulatory Visit (INDEPENDENT_AMBULATORY_CARE_PROVIDER_SITE_OTHER): Payer: Medicare HMO | Admitting: Internal Medicine

## 2022-05-05 VITALS — BP 155/83 | HR 80 | Temp 98.1°F | Wt 176.0 lb

## 2022-05-05 DIAGNOSIS — M8668 Other chronic osteomyelitis, other site: Secondary | ICD-10-CM

## 2022-05-05 DIAGNOSIS — R110 Nausea: Secondary | ICD-10-CM

## 2022-05-05 DIAGNOSIS — K146 Glossodynia: Secondary | ICD-10-CM | POA: Diagnosis not present

## 2022-05-05 DIAGNOSIS — G5711 Meralgia paresthetica, right lower limb: Secondary | ICD-10-CM

## 2022-05-05 DIAGNOSIS — T847XXD Infection and inflammatory reaction due to other internal orthopedic prosthetic devices, implants and grafts, subsequent encounter: Secondary | ICD-10-CM

## 2022-05-05 MED ORDER — ONDANSETRON HCL 8 MG PO TABS
8.0000 mg | ORAL_TABLET | Freq: Three times a day (TID) | ORAL | 3 refills | Status: DC | PRN
  Filled 2022-05-05: qty 20, 7d supply, fill #0
  Filled 2022-05-31: qty 20, 7d supply, fill #1
  Filled 2022-06-25: qty 20, 7d supply, fill #2
  Filled 2022-07-15: qty 20, 7d supply, fill #3

## 2022-05-05 NOTE — Progress Notes (Signed)
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[place]) 02/05/2022   Bacteremia due to Proteus species    Sepsis ([place]) 02/03/2022   Iron deficiency anemia 02/03/2022   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   S/P lumbar spinal fusion 11/17/2021   Lumbar radiculopathy 04/21/2021   Spondylolisthesis of lumbar region 02/01/2018   Gastroesophageal reflux disease without esophagitis 01/16/2018   Osteoarthritis of left hip 04/07/2014   BMI 40.0-44.9, adult ([place]) 04/07/2014   DM type 2 (diabetes mellitus, type 2) (HCC)    HTN (hypertension)    Hypercholesterolemia    Reflux       Subjective:    Patient ID: [name], female    DOB: [birth year], 66 y.o.   MRN: [id number]  No chief complaint on file.   HPI:  [name] is a 66 y.o. female here for f/u hx 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  She had associated bacteremia proteus mirabilis blood stream infection 02/03/2022, in setting mri imaging lumbar surgical site abscess/cellulitis. S/p I&D with all old hardware removed but new screws placed on 9/15  04/07/22 id clinic visit She had finished iv ceftriaxone of 8 weeks by 04/02/2022 and transitioned to bactrim ss bid; the cefazolin mic was 8  She walks with fww. She is sore in lower back bilaterally when moving but nothing in the middle. This is chronic. 7 at worst; 2/10 resting   No fever, chill, diarrhea   05/05/22 id clinic visit She had chart hx of sulfa allergy although she doesn't recall any bactrim use or other sulfa product use in the past She tolerated bactrim ss bid fine without n/v/diarrhea/rash Back pain is stable No f/c  She complains of 3-4 weeks right anterior thigh numbness/tingling. She wears tight clothing now and then but not consistently.   She has right lower ext weakness  after back surgery but that is getting better. Lower back pain 3/10 not bad. Bilateral lower back sore/stiff after pt/ot session though. Overall since back surgery 01/2022 leg weakness/back pain better. Walks with walker still  Also complains of chronic tongue burning with things like toothpaste, spicy food. Sensation transient. Doesn't disturb her in other ways. This was heard while she was on ceftriaxone as well.    Allergies  Allergen Reactions   Crestor [Rosuvastatin] Other (See Comments)    Myalgia    Robaxin [Methocarbamol] Other (See Comments)    Insomnia   Toradol [Ketorolac Tromethamine] Other (See Comments)   Zocor [Simvastatin] Other (See Comments)    Myalgias    Lipitor [Atorvastatin] Other (See Comments)    Myalgia   Sulfa Antibiotics Hives      Outpatient Medications Prior to Visit  Medication Sig Dispense Refill   acetaminophen (TYLENOL) 500 MG tablet Take 1,000 mg by mouth at bedtime as needed for mild pain or headache.     albuterol (PROAIR HFA) 108 (90 BASE) MCG/ACT inhaler Inhale 2 puffs into the lungs every 6 (six) hours as needed. 1 Inhaler 12   amLODipine (NORVASC) 10 MG tablet Take 1 tablet (10 mg total) by mouth daily. 90 tablet 1   cefadroxil (DURICEF) 1 g tablet Take 1 tablet (1 g total) by mouth 2 (two) times daily. (Patient not taking: Reported on 04/07/2022) 60 tablet 0   celecoxib (CELEBREX) 200 MG capsule Take 200 mg by mouth 2 (two)  times daily.   1   cetirizine (ZYRTEC) 10 MG tablet Take 1 tablet (10 mg total) by mouth daily. 30 tablet 11   cyanocobalamin (VITAMIN B12) 1000 MCG tablet Take 1,000 mcg by mouth daily. (Patient not taking: Reported on 04/07/2022)     docusate sodium (COLACE) 100 MG capsule Take 100 mg by mouth 2 (two) times daily.     gabapentin (NEURONTIN) 600 MG tablet Take 600 mg by mouth 3 (three) times daily.     iron polysaccharides (NIFEREX) 150 MG capsule TAKE 1 CAPSULE (150 MG DOSE) BY MOUTH TWICE A DAY (Patient taking differently:  Take 150 mg by mouth daily.) 180 capsule 1   losartan-hydrochlorothiazide (HYZAAR) 100-25 MG tablet Take 1 tablet by mouth daily. 90 tablet 1   metFORMIN (GLUCOPHAGE) 1000 MG tablet Take 1 tablet (1,000 mg total) by mouth 2 (two) times daily with a meal. 180 tablet 1   metoprolol succinate (TOPROL-XL) 100 MG 24 hr tablet Take 1 tablet (100 mg total) by mouth daily. (Patient taking differently: Take 100 mg by mouth at bedtime.) 90 tablet 1   ondansetron (ZOFRAN) 4 MG tablet Take 1 tablet (4 mg total) by mouth daily as needed for nausea or vomiting. Take 30 minutes prior to IV antibiotic. 30 tablet 1   oxyCODONE (OXY IR/ROXICODONE) 5 MG immediate release tablet Take 1 tablet (5 mg total) by mouth every 6 (six) hours as needed for moderate pain ((score 4 to 6)). 30 tablet 0   oxyCODONE (OXY IR/ROXICODONE) 5 MG immediate release tablet Take 1 tablet (5 mg total) by mouth every 6 (six) hours as needed for moderate pain. 30 tablet 0   pantoprazole (PROTONIX) 40 MG tablet Take 40 mg by mouth daily.     sulfamethoxazole-trimethoprim (BACTRIM) 400-80 MG tablet Take 1 tablet by mouth 2 (two) times daily. 60 tablet 11   tiZANidine (ZANAFLEX) 4 MG tablet Take 4 mg by mouth at bedtime as needed for muscle spasms. (Patient not taking: Reported on 04/07/2022)     No facility-administered medications prior to visit.     Social History   Socioeconomic History   Marital status: Married    Spouse name: Not on file   Number of children: Not on file   Years of education: Not on file   Highest education level: Not on file  Occupational History   Not on file  Tobacco Use   Smoking status: Never   Smokeless tobacco: Never  Vaping Use   Vaping Use: Never used  Substance and Sexual Activity   Alcohol use: No    Alcohol/week: 0.0 standard drinks of alcohol   Drug use: No   Sexual activity: Yes  Other Topics Concern   Not on file  Social History Narrative   Not on file   Social Determinants of Health    Financial Resource Strain: Not on file  Food Insecurity: No Food Insecurity (02/03/2022)   Hunger Vital Sign    Worried About Running Out of Food in the Last Year: Never true    Ran Out of Food in the Last Year: Never true  Transportation Needs: No Transportation Needs (02/03/2022)   PRAPARE - Hydrologist (Medical): No    Lack of Transportation (Non-Medical): No  Physical Activity: Not on file  Stress: Not on file  Social Connections: Not on file  Intimate Partner Violence: Not At Risk (02/03/2022)   Humiliation, Afraid, Rape, and Kick questionnaire    Fear of Current or  Ex-Partner: No    Emotionally Abused: No    Physically Abused: No    Sexually Abused: No      Review of Systems    All other ros negative Objective:    BP (!) 155/83   Pulse 80   Temp 98.1 F (36.7 C) (Oral)   Wt 176 lb (79.8 kg)   SpO2 99%   BMI 36.78 kg/m  Nursing note and vital signs reviewed.  Physical Exam  General/constitutional: no distress, pleasant; walks with walker HEENT: Normocephalic, PER, Conj Clear, EOMI, Oropharynx clear Neck supple CV: rrr no mrg Lungs: clear to auscultation, normal respiratory effort Abd: Soft, Nontender Ext: no edema Skin: No Rash Neuro: slight 4-5/5 strength grossly symmetric although patient reports right LE weaker proximally; intact sensation to light touch bilateral LE; slow gait MSK: no peripheral joint swelling/tenderness/warmth; back spines nontender     Labs: Lab Results  Component Value Date   WBC 11.4 (H) 02/10/2022   HGB 8.5 (L) 02/10/2022   HCT 26.8 (L) 02/10/2022   MCV 77.5 (L) 02/10/2022   PLT 414 (H) 27/11/8673   Last metabolic panel Lab Results  Component Value Date   GLUCOSE 119 (H) 02/08/2022   NA 138 02/08/2022   K 3.5 02/08/2022   CL 99 02/08/2022   CO2 29 02/08/2022   BUN 8 02/08/2022   CREATININE 0.87 02/08/2022   GFRNONAA >60 02/08/2022   CALCIUM 9.3 02/08/2022   PROT 7.8 02/03/2022    ALBUMIN 3.6 02/03/2022   LABGLOB 2.9 08/19/2017   AGRATIO 1.7 08/19/2017   BILITOT 0.6 02/03/2022   ALKPHOS 71 02/03/2022   AST 16 02/03/2022   ALT 12 02/03/2022   ANIONGAP 10 02/08/2022     Micro:  Serology:  Imaging: 03/31/22 mri lumbar spine 1. Postoperative changes since the prior MRI. No findings suspicious for persistent discitis, epidural abscess or paraspinal abscess. 2. New marrow signal abnormality in the L2 and L3 vertebral bodies and subsequent enhancement but no enhancement in the disc space to suggest persistent discitis. This could be remodeling changes related to the recent surgery and new hardware. 3. Some residual prevertebral inflammatory changes but no rim enhancing abscess.  Assessment & Plan:   Problem List Items Addressed This Visit   None Visit Diagnoses     Hardware complicating wound infection, subsequent encounter    -  Primary   Relevant Orders   CBC w/Diff   COMPLETE METABOLIC PANEL WITH GFR   C-reactive protein   Chronic osteomyelitis of lumbar spine (HCC)       Relevant Orders   CBC w/Diff   COMPLETE METABOLIC PANEL WITH GFR   C-reactive protein        No orders of the defined types were placed in this encounter.    #Post-op lumbar infection SP I&D and HW removal with Cx+ Proteus mirabilis  on 9/15; and placement of new screws into infected spine #Psoas abscess #Proteus mirabilis bacteremia  -9/13 blood cultures 1/2 Proteus mirabilis.  Initially started on vancomycin, cefepime, metronidazole. - initial MRI L-spine showed epidural abscess extending anteriorly from soft tissue to fusion hardware L3-L4. 1.1x0.8 cm focal area in right psoas muscle suspicious for small abscess - Underwent I&D with hardware removal on 9/15 with neurosurgery Dr. Christella Noa.  Of note all the old hardware was removed new pedicle screws were placed.  ID was engaged and plan for 8 weeks of antibiotics more.  OR cultures grew Proteus mirabilis with cefazolin  MIC 8  as such switched  cefazolin to ceftriaxone.   - s/p 8 weeks ceftriaxone by 11/10, switched to bactrim ss bid chronically  05/05/22 id assessment Repeat mri lumbar spine 03/31/22 showed no further abscess Clinically also improving -Meralgia paresthetica and tongue burning not related to abx -- I suggest she avoid tight clothing and try capsaicin for the former issue, and also to discuss with pcp/neurology about this; at this age, possibility of b12 deficiency although presenting sx are rather early and prior cbc profile not too suggestive -refill zofran at request for nausea -prn ice pack/tylenol for muscle stiffness post pt/ot -labs today to assess toxicity of bactrim -f/u 3 months   Follow-up: Return in about 3 months (around 08/04/2022).      Jabier Mutton, [facility] 05/05/2022, 2:13 PM

## 2022-05-05 NOTE — Patient Instructions (Signed)
I have rx'ed you more zofran  For the right thigh nunmbess, avoid wearing tight clothing. But you can also try capsaicin cream over the counter as needed to see if that would help  Together with the tongue burning sensation, maybe you should talk to your primary care or neurologist to workup potentially neuropathy if there are more areas in your body that begin to show abnormal sensation. At this time I don't see something obvious. Vitamin b12 deficiency can occur though via acquired mean as you age so that could be checked with your primary care doc if he/she thinks it is appropriate  Today let's check lab and make sure your body is tolerating bactrim.

## 2022-05-06 LAB — COMPLETE METABOLIC PANEL WITH GFR
AG Ratio: 1.4 (calc) (ref 1.0–2.5)
ALT: 9 U/L (ref 6–29)
AST: 13 U/L (ref 10–35)
Albumin: 4.5 g/dL (ref 3.6–5.1)
Alkaline phosphatase (APISO): 63 U/L (ref 37–153)
BUN/Creatinine Ratio: 13 (calc) (ref 6–22)
BUN: 18 mg/dL (ref 7–25)
CO2: 23 mmol/L (ref 20–32)
Calcium: 10.2 mg/dL (ref 8.6–10.4)
Chloride: 107 mmol/L (ref 98–110)
Creat: 1.38 mg/dL — ABNORMAL HIGH (ref 0.50–1.05)
Globulin: 3.2 g/dL (calc) (ref 1.9–3.7)
Glucose, Bld: 113 mg/dL — ABNORMAL HIGH (ref 65–99)
Potassium: 4.3 mmol/L (ref 3.5–5.3)
Sodium: 142 mmol/L (ref 135–146)
Total Bilirubin: 0.2 mg/dL (ref 0.2–1.2)
Total Protein: 7.7 g/dL (ref 6.1–8.1)
eGFR: 43 mL/min/{1.73_m2} — ABNORMAL LOW (ref >=60)

## 2022-05-06 LAB — CBC WITH DIFFERENTIAL/PLATELET
Absolute Monocytes: 488 cells/uL (ref 200–950)
Basophils Absolute: 52 cells/uL (ref 0–200)
Basophils Relative: 0.8 %
Eosinophils Absolute: 306 cells/uL (ref 15–500)
Eosinophils Relative: 4.7 %
HCT: 31.3 % — ABNORMAL LOW (ref 35.0–45.0)
Hemoglobin: 9.6 g/dL — ABNORMAL LOW (ref 11.7–15.5)
Lymphs Abs: 2243 cells/uL (ref 850–3900)
MCH: 23 pg — ABNORMAL LOW (ref 27.0–33.0)
MCHC: 30.7 g/dL — ABNORMAL LOW (ref 32.0–36.0)
MCV: 75.1 fL — ABNORMAL LOW (ref 80.0–100.0)
MPV: 10.9 fL (ref 7.5–12.5)
Monocytes Relative: 7.5 %
Neutro Abs: 3413 cells/uL (ref 1500–7800)
Neutrophils Relative %: 52.5 %
Platelets: 464 10*3/uL — ABNORMAL HIGH (ref 140–400)
RBC: 4.17 10*6/uL (ref 3.80–5.10)
RDW: 18.7 % — ABNORMAL HIGH (ref 11.0–15.0)
Total Lymphocyte: 34.5 %
WBC: 6.5 10*3/uL (ref 3.8–10.8)

## 2022-05-06 LAB — C-REACTIVE PROTEIN: CRP: 20.8 mg/L — ABNORMAL HIGH (ref <8.0)

## 2022-05-14 ENCOUNTER — Other Ambulatory Visit (HOSPITAL_COMMUNITY): Payer: Self-pay

## 2022-06-25 ENCOUNTER — Other Ambulatory Visit (HOSPITAL_COMMUNITY): Payer: Self-pay

## 2022-07-08 DIAGNOSIS — N1831 Chronic kidney disease, stage 3a: Secondary | ICD-10-CM | POA: Insufficient documentation

## 2022-07-08 DIAGNOSIS — E66812 Obesity, class 2: Secondary | ICD-10-CM | POA: Insufficient documentation

## 2022-07-08 DIAGNOSIS — M1711 Unilateral primary osteoarthritis, right knee: Secondary | ICD-10-CM | POA: Insufficient documentation

## 2022-07-15 ENCOUNTER — Other Ambulatory Visit (HOSPITAL_COMMUNITY): Payer: Self-pay

## 2022-07-20 ENCOUNTER — Telehealth: Payer: Self-pay | Admitting: Cardiovascular Disease

## 2022-07-20 DIAGNOSIS — R072 Precordial pain: Secondary | ICD-10-CM

## 2022-07-20 DIAGNOSIS — I350 Nonrheumatic aortic (valve) stenosis: Secondary | ICD-10-CM

## 2022-07-20 NOTE — Telephone Encounter (Signed)
Per Dr. Johnsie Cancel, haven't seen her in over two year have her see primary and get on proton pump inhibitor needs f/u echo for AS and PET/CT scan to r/o CAD has not had stress test in recent past Can f/u with Olivia Mackie who saw her last

## 2022-07-20 NOTE — Telephone Encounter (Signed)
Pt c/o of Chest Pain: STAT if CP now or developed within 24 hours  1. Are you having CP right now? No   2. Are you experiencing any other symptoms (ex. SOB, nausea, vomiting, sweating)? No  3. How long have you been experiencing CP? Last couple of weeks   4. Is your CP continuous or coming and going? Coming and going after she eats   5. Have you taken Nitroglycerin? No ?

## 2022-07-20 NOTE — Telephone Encounter (Signed)
Patient stated for the past two weeks she has been experiencing chest pain after each meal. Pt did not contact PCP pertaining to symptoms. Currently asymptomatic, will forward to MD and nurse for advise.

## 2022-07-21 NOTE — Telephone Encounter (Signed)
Patient agreed to recommendations. Orders placed for testing. Will send instructions for cardiac PET/CT through mychart.

## 2022-08-02 ENCOUNTER — Telehealth (HOSPITAL_COMMUNITY): Payer: Self-pay | Admitting: Emergency Medicine

## 2022-08-02 DIAGNOSIS — I2510 Atherosclerotic heart disease of native coronary artery without angina pectoris: Secondary | ICD-10-CM

## 2022-08-02 NOTE — Telephone Encounter (Signed)
Attempted to call patient regarding upcoming cardiac PET appointment. Left message on voicemail with name and callback number Arville Postlewaite RN Navigator Cardiac Imaging [facility] [phone] Office [phone] Cell  

## 2022-08-03 ENCOUNTER — Ambulatory Visit (INDEPENDENT_AMBULATORY_CARE_PROVIDER_SITE_OTHER): Payer: Medicare HMO | Admitting: Internal Medicine

## 2022-08-03 ENCOUNTER — Telehealth (HOSPITAL_COMMUNITY): Payer: Self-pay | Admitting: *Deleted

## 2022-08-03 ENCOUNTER — Other Ambulatory Visit: Payer: Self-pay

## 2022-08-03 VITALS — BP 124/71 | HR 92 | Temp 97.1°F | Ht <= 58 in | Wt 180.0 lb

## 2022-08-03 DIAGNOSIS — M8668 Other chronic osteomyelitis, other site: Secondary | ICD-10-CM

## 2022-08-03 NOTE — Telephone Encounter (Signed)
Patient returning call about her upcoming cardiac imaging study; pt verbalizes understanding of appt date/time, parking situation and where to check in, and verified current allergies; name and call back number provided for further questions should they arise  Gordy Clement RN Navigator Cardiac Imaging [facility] [phone] office [phone] cell  Patient aware to avoid caffeine 12 hours prior to her cardiac PET scan.

## 2022-08-03 NOTE — Progress Notes (Signed)
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[place]) 02/05/2022   Bacteremia due to Proteus species    Sepsis ([place]) 02/03/2022   Iron deficiency anemia 02/03/2022   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   S/P lumbar spinal fusion 11/17/2021   Lumbar radiculopathy 04/21/2021   Spondylolisthesis of lumbar region 02/01/2018   Gastroesophageal reflux disease without esophagitis 01/16/2018   Osteoarthritis of left hip 04/07/2014   BMI 40.0-44.9, adult ([place]) 04/07/2014   DM type 2 (diabetes mellitus, type 2) (HCC)    HTN (hypertension)    Hypercholesterolemia    Reflux       Subjective:    Patient ID: [name], female    DOB: August 25, 1955, 67 y.o.   MRN: [id number]  Chief Complaint  Patient presents with   Follow-up     HPI:  [name] is a 67 y.o. female here for f/u hx 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  She had associated bacteremia proteus mirabilis blood stream infection 02/03/2022, in setting mri imaging lumbar surgical site abscess/cellulitis. S/p I&D with all old hardware removed but new screws placed on 9/15  04/07/22 id clinic visit She had finished iv ceftriaxone of 8 weeks by 04/02/2022 and transitioned to bactrim ss bid; the cefazolin mic was 8  She walks with fww. She is sore in lower back bilaterally when moving but nothing in the middle. This is chronic. 7 at worst; 2/10 resting   No fever, chill, diarrhea   05/05/22 id clinic visit She had chart hx of sulfa allergy although she doesn't recall any bactrim use or other sulfa product use in the past She tolerated bactrim ss bid fine without n/v/diarrhea/rash Back pain is stable No f/c  She complains of 3-4 weeks right anterior thigh numbness/tingling. She wears tight clothing now and then but not consistently.   She has  right lower ext weakness after back surgery but that is getting better. Lower back pain 3/10 not bad. Bilateral lower back sore/stiff after pt/ot session though. Overall since back surgery 01/2022 leg weakness/back pain better. Walks with walker still  Also complains of chronic tongue burning with things like toothpaste, spicy food. Sensation transient. Doesn't disturb her in other ways. This was heard while she was on ceftriaxone as well.    08/03/22 id clinic f/u Patient doing very well minimal pain (2/10) No rash, n/v/diarrhea  Allergies  Allergen Reactions   Crestor [Rosuvastatin] Other (See Comments)    Myalgia    Robaxin [Methocarbamol] Other (See Comments)    Insomnia   Toradol [Ketorolac Tromethamine] Other (See Comments)   Zocor [Simvastatin] Other (See Comments)    Myalgias    Lipitor [Atorvastatin] Other (See Comments)    Myalgia   Sulfa Antibiotics Hives      Outpatient Medications Prior to Visit  Medication Sig Dispense Refill   acetaminophen (TYLENOL) 500 MG tablet Take 1,000 mg by mouth at bedtime as needed for mild pain or headache.     albuterol (PROAIR HFA) 108 (90 BASE) MCG/ACT inhaler Inhale 2 puffs into the lungs every 6 (six) hours as needed. 1 Inhaler 12   amLODipine (NORVASC) 10 MG tablet Take 1 tablet (10 mg total) by mouth daily. 90 tablet 1   celecoxib (CELEBREX) 200 MG capsule Take 200 mg by mouth 2 (two) times daily.   1  cetirizine (ZYRTEC) 10 MG tablet Take 1 tablet (10 mg total) by mouth daily. 30 tablet 11   docusate sodium (COLACE) 100 MG capsule Take 100 mg by mouth 2 (two) times daily.     gabapentin (NEURONTIN) 600 MG tablet Take 600 mg by mouth 3 (three) times daily.     iron polysaccharides (NIFEREX) 150 MG capsule TAKE 1 CAPSULE (150 MG DOSE) BY MOUTH TWICE A DAY (Patient taking differently: Take 150 mg by mouth daily.) 180 capsule 1   losartan-hydrochlorothiazide (HYZAAR) 100-25 MG tablet Take 1 tablet by mouth daily. 90 tablet 1   metFORMIN  (GLUCOPHAGE) 1000 MG tablet Take 1 tablet (1,000 mg total) by mouth 2 (two) times daily with a meal. 180 tablet 1   metoprolol succinate (TOPROL-XL) 100 MG 24 hr tablet Take 1 tablet (100 mg total) by mouth daily. (Patient taking differently: Take 100 mg by mouth at bedtime.) 90 tablet 1   ondansetron (ZOFRAN) 8 MG tablet Take 1 tablet (8 mg total) by mouth every 8 (eight) hours as needed for nausea or vomiting. 20 tablet 3   pantoprazole (PROTONIX) 40 MG tablet Take 40 mg by mouth daily.     sulfamethoxazole-trimethoprim (BACTRIM) 400-80 MG tablet Take 1 tablet by mouth 2 (two) times daily. 60 tablet 11   tiZANidine (ZANAFLEX) 4 MG tablet Take 4 mg by mouth at bedtime as needed for muscle spasms.     cefadroxil (DURICEF) 1 g tablet Take 1 tablet (1 g total) by mouth 2 (two) times daily. (Patient not taking: Reported on 04/07/2022) 60 tablet 0   cyanocobalamin (VITAMIN B12) 1000 MCG tablet Take 1,000 mcg by mouth daily. (Patient not taking: Reported on 04/07/2022)     oxyCODONE (OXY IR/ROXICODONE) 5 MG immediate release tablet Take 1 tablet (5 mg total) by mouth every 6 (six) hours as needed for moderate pain ((score 4 to 6)). (Patient not taking: Reported on 08/03/2022) 30 tablet 0   oxyCODONE (OXY IR/ROXICODONE) 5 MG immediate release tablet Take 1 tablet (5 mg total) by mouth every 6 (six) hours as needed for moderate pain. (Patient not taking: Reported on 08/03/2022) 30 tablet 0   No facility-administered medications prior to visit.     Social History   Socioeconomic History   Marital status: Married    Spouse name: Not on file   Number of children: Not on file   Years of education: Not on file   Highest education level: Not on file  Occupational History   Not on file  Tobacco Use   Smoking status: Never   Smokeless tobacco: Never  Vaping Use   Vaping Use: Never used  Substance and Sexual Activity   Alcohol use: No    Alcohol/week: 0.0 standard drinks of alcohol   Drug use: No    Sexual activity: Yes  Other Topics Concern   Not on file  Social History Narrative   Not on file   Social Determinants of Health   Financial Resource Strain: Not on file  Food Insecurity: No Food Insecurity (02/03/2022)   Hunger Vital Sign    Worried About Running Out of Food in the Last Year: Never true    Ran Out of Food in the Last Year: Never true  Transportation Needs: No Transportation Needs (02/03/2022)   PRAPARE - Hydrologist (Medical): No    Lack of Transportation (Non-Medical): No  Physical Activity: Not on file  Stress: Not on file  Social Connections: Not on file  Intimate Partner Violence: Not At Risk (02/03/2022)   Humiliation, Afraid, Rape, and Kick questionnaire    Fear of Current or Ex-Partner: No    Emotionally Abused: No    Physically Abused: No    Sexually Abused: No      Review of Systems    All other ros negative Objective:    BP 124/71   Pulse 92   Temp (!) 97.1 F (36.2 C) (Temporal)   Ht '4\' 9"'$  (1.448 m)   Wt 180 lb (81.6 kg)   SpO2 99%   BMI 38.95 kg/m  Nursing note and vital signs reviewed.  Physical Exam  General/constitutional: no distress, pleasant; walks with cane HEENT: Normocephalic, PER, Conj Clear, EOMI, Oropharynx clear Neck supple CV: rrr no mrg Lungs: clear to auscultation, normal respiratory effort Abd: Soft, Nontender Ext: no edema Skin: No Rash Neuro: nonfocal MSK: no peripheral joint swelling/tenderness/warmth; back spines nontender     Labs: Lab Results  Component Value Date   WBC 6.5 05/05/2022   HGB 9.6 (L) 05/05/2022   HCT 31.3 (L) 05/05/2022   MCV 75.1 (L) 05/05/2022   PLT 464 (H) 123XX123   Last metabolic panel Lab Results  Component Value Date   GLUCOSE 113 (H) 05/05/2022   NA 142 05/05/2022   K 4.3 05/05/2022   CL 107 05/05/2022   CO2 23 05/05/2022   BUN 18 05/05/2022   CREATININE 1.38 (H) 05/05/2022   GFRNONAA >60 02/08/2022   CALCIUM 10.2 05/05/2022   PROT  7.7 05/05/2022   ALBUMIN 3.6 02/03/2022   LABGLOB 2.9 08/19/2017   AGRATIO 1.7 08/19/2017   BILITOT 0.2 05/05/2022   ALKPHOS 71 02/03/2022   AST 13 05/05/2022   ALT 9 05/05/2022   ANIONGAP 10 02/08/2022     Micro:  Serology:  Imaging: 03/31/22 mri lumbar spine 1. Postoperative changes since the prior MRI. No findings suspicious for persistent discitis, epidural abscess or paraspinal abscess. 2. New marrow signal abnormality in the L2 and L3 vertebral bodies and subsequent enhancement but no enhancement in the disc space to suggest persistent discitis. This could be remodeling changes related to the recent surgery and new hardware. 3. Some residual prevertebral inflammatory changes but no rim enhancing abscess.  Assessment & Plan:   Problem List Items Addressed This Visit   None Visit Diagnoses     Chronic osteomyelitis of lumbar spine (HCC)    -  Primary   Relevant Orders   C-reactive protein   CBC with Differential/Platelet   Comp Met (CMET)        No orders of the defined types were placed in this encounter.    #Post-op lumbar infection SP I&D and HW removal with Cx+ Proteus mirabilis  on 9/15; and placement of new screws into infected spine #Psoas abscess #Proteus mirabilis bacteremia  -9/13 blood cultures 1/2 Proteus mirabilis.  Initially started on vancomycin, cefepime, metronidazole. - initial MRI L-spine showed epidural abscess extending anteriorly from soft tissue to fusion hardware L3-L4. 1.1x0.8 cm focal area in right psoas muscle suspicious for small abscess - Underwent I&D with hardware removal on 9/15 with neurosurgery Dr. Christella Noa.  Of note all the old hardware was removed new pedicle screws were placed.  ID was engaged and plan for 8 weeks of antibiotics more.  OR cultures grew Proteus mirabilis with cefazolin MIC 8  as such switched  cefazolin to ceftriaxone.   - s/p 8 weeks ceftriaxone by 11/10, switched to bactrim ss bid chronically  05/05/22  id assessment  Repeat mri lumbar spine 03/31/22 showed no further abscess Clinically also improving -Meralgia paresthetica and tongue burning not related to abx -- I suggest she avoid tight clothing and try capsaicin for the former issue, and also to discuss with pcp/neurology about this; at this age, possibility of b12 deficiency although presenting sx are rather early and prior cbc profile not too suggestive -refill zofran at request for nausea -prn ice pack/tylenol for muscle stiffness post pt/ot -labs today to assess toxicity of bactrim -f/u 3 months  08/03/22 id clinic assessment Doing very well Discuss with her trial off abx Labs today and f/u 6-8 weeks when she'll decide on abx continuation     Follow-up: No follow-ups on file.      Jabier Mutton, [facility] 08/03/2022, 2:20 PM

## 2022-08-04 ENCOUNTER — Encounter (HOSPITAL_COMMUNITY)
Admission: RE | Admit: 2022-08-04 | Discharge: 2022-08-04 | Disposition: A | Discharge status: Home / Self Care | Payer: Medicare HMO | Source: Ambulatory Visit | Attending: Cardiovascular Disease | Admitting: Cardiovascular Disease

## 2022-08-04 ENCOUNTER — Telehealth: Payer: Self-pay

## 2022-08-04 ENCOUNTER — Other Ambulatory Visit (HOSPITAL_COMMUNITY): Payer: Self-pay

## 2022-08-04 DIAGNOSIS — R072 Precordial pain: Secondary | ICD-10-CM | POA: Diagnosis present

## 2022-08-04 DIAGNOSIS — R110 Nausea: Secondary | ICD-10-CM

## 2022-08-04 DIAGNOSIS — I350 Nonrheumatic aortic (valve) stenosis: Secondary | ICD-10-CM

## 2022-08-04 LAB — COMPREHENSIVE METABOLIC PANEL
AG Ratio: 1.5 (calc) (ref 1.0–2.5)
ALT: 9 U/L (ref 6–29)
AST: 10 U/L (ref 10–35)
Albumin: 4.5 g/dL (ref 3.6–5.1)
Alkaline phosphatase (APISO): 71 U/L (ref 37–153)
BUN/Creatinine Ratio: 23 (calc) — ABNORMAL HIGH (ref 6–22)
BUN: 31 mg/dL — ABNORMAL HIGH (ref 7–25)
CO2: 25 mmol/L (ref 20–32)
Calcium: 10.2 mg/dL (ref 8.6–10.4)
Chloride: 107 mmol/L (ref 98–110)
Creat: 1.36 mg/dL — ABNORMAL HIGH (ref 0.50–1.05)
Globulin: 3 g/dL (calc) (ref 1.9–3.7)
Glucose, Bld: 104 mg/dL — ABNORMAL HIGH (ref 65–99)
Potassium: 4.5 mmol/L (ref 3.5–5.3)
Sodium: 142 mmol/L (ref 135–146)
Total Bilirubin: 0.2 mg/dL (ref 0.2–1.2)
Total Protein: 7.5 g/dL (ref 6.1–8.1)

## 2022-08-04 LAB — C-REACTIVE PROTEIN: CRP: 26.3 mg/L — ABNORMAL HIGH (ref <8.0)

## 2022-08-04 LAB — CBC WITH DIFFERENTIAL/PLATELET
Absolute Monocytes: 601 cells/uL (ref 200–950)
Basophils Absolute: 62 cells/uL (ref 0–200)
Basophils Relative: 0.8 %
Eosinophils Absolute: 154 cells/uL (ref 15–500)
Eosinophils Relative: 2 %
HCT: 35.1 % (ref 35.0–45.0)
Hemoglobin: 10.7 g/dL — ABNORMAL LOW (ref 11.7–15.5)
Lymphs Abs: 2533 cells/uL (ref 850–3900)
MCH: 22.3 pg — ABNORMAL LOW (ref 27.0–33.0)
MCHC: 30.5 g/dL — ABNORMAL LOW (ref 32.0–36.0)
MCV: 73.1 fL — ABNORMAL LOW (ref 80.0–100.0)
MPV: 10.1 fL (ref 7.5–12.5)
Monocytes Relative: 7.8 %
Neutro Abs: 4351 cells/uL (ref 1500–7800)
Neutrophils Relative %: 56.5 %
Platelets: 391 10*3/uL (ref 140–400)
RBC: 4.8 10*6/uL (ref 3.80–5.10)
RDW: 17.5 % — ABNORMAL HIGH (ref 11.0–15.0)
Total Lymphocyte: 32.9 %
WBC: 7.7 10*3/uL (ref 3.8–10.8)

## 2022-08-04 LAB — NM PET CT CARDIAC PERFUSION MULTI W/ABSOLUTE BLOODFLOW
LV dias vol: 74 mL (ref 46–106)
LV sys vol: 28 mL
MBFR: 2.22
Nuc Rest EF: 62 %
Nuc Stress EF: 63 %
Rest MBF: 1.16 ml/g/min
Rest Nuclear Isotope Dose: 21 mCi
ST Depression (mm): 0 mm
Stress MBF: 2.57 ml/g/min
Stress Nuclear Isotope Dose: 21.2 mCi

## 2022-08-04 MED ORDER — REGADENOSON 0.4 MG/5ML IV SOLN
INTRAVENOUS | Status: AC
  Filled 2022-08-04: qty 5

## 2022-08-04 MED ORDER — RUBIDIUM RB82 GENERATOR (RUBYFILL)
21.2000 | PACK | Freq: Once | INTRAVENOUS | Status: AC
  Administered 2022-08-04: 21.2 via INTRAVENOUS

## 2022-08-04 MED ORDER — REGADENOSON 0.4 MG/5ML IV SOLN
0.4000 mg | Freq: Once | INTRAVENOUS | Status: AC
  Administered 2022-08-04: 0.4 mg via INTRAVENOUS

## 2022-08-04 MED ORDER — RUBIDIUM RB82 GENERATOR (RUBYFILL)
21.0000 | PACK | Freq: Once | INTRAVENOUS | Status: AC
  Administered 2022-08-04: 21 via INTRAVENOUS

## 2022-08-04 MED ORDER — ONDANSETRON HCL 8 MG PO TABS
8.0000 mg | ORAL_TABLET | Freq: Three times a day (TID) | ORAL | 3 refills | Status: DC | PRN
  Filled 2022-08-04: qty 20, 7d supply, fill #0
  Filled 2022-08-30: qty 20, 7d supply, fill #1
  Filled 2022-09-21: qty 20, 7d supply, fill #2
  Filled 2022-10-22: qty 20, 7d supply, fill #3

## 2022-08-04 NOTE — Addendum Note (Signed)
Addended by: Lucie Leather D on: 08/04/2022 11:14 AM   Modules accepted: Orders

## 2022-08-04 NOTE — Telephone Encounter (Signed)
Patient called front desk, requesting refills of antibiotic and ondansetron.   Beryle Flock, RN

## 2022-08-04 NOTE — Telephone Encounter (Signed)
Called patient to let her know that refill of ondansetron will be sent in, but that she should have plenty of refills on file for Bactrim, no answer. Left HIPAA compliant voicemail requesting callback.   Beryle Flock, RN

## 2022-08-18 ENCOUNTER — Ambulatory Visit (HOSPITAL_COMMUNITY): Payer: Medicare HMO | Attending: Cardiovascular Disease

## 2022-08-18 ENCOUNTER — Encounter (HOSPITAL_COMMUNITY): Payer: Self-pay | Admitting: Cardiovascular Disease

## 2022-08-18 NOTE — Progress Notes (Deleted)
Office Visit    Patient Name: [name] Date of Encounter: 08/18/2022  PCP:  Chesley Noon, MD   [facility]  Cardiologist:  Jenkins Rouge, MD  Advanced Practice Provider:  No care team member to display Electrophysiologist:  None  HPI    [name] is a 67 y.o. female with past medical history significant for moderate aortic stenosis, anxiety, asthma, obesity, diabetes mellitus type 2, GERD, hypertension, hyperlipidemia presents today for overdue follow-up appointment.  She was last seen in 03/2020 and at that time she had been on disability since her spinal fusion in 2019.  She had a history of moderate aortic stenosis with TTE done 03/13/2020 with mean gradient 31 mmHg peak 48 mmHg and mild AR.  She had no history of CAD Myoview done 03/26/2019 with no ischemia EF 60%.   Today, she states she is not having any cardiovascular symptoms today.  She does have a blood pressure cuff at home and tries to monitor.  She states next week she is having back surgery.  Our office has not received any cardiac clearance for her surgery.  However, we did go through the [facility] today and she barely met our 4 METS minimum requirement.  She scored a 4.31 METS.  It appears that her back limits her more than her cardiovascular health.  She uses a walker. She has a bad slipped disk   Past Medical History    Past Medical History:  Diagnosis Date   Allergy    Anemia    Anxiety    Aortic stenosis    moderate AS by echo 12/2021   Arthritis    Asthma    BMI 40.0-44.9, adult ([facility]) 04/07/2014   DM type 2 (diabetes mellitus, type 2) (HCC)    Dyspnea    GERD (gastroesophageal reflux disease)    Heart murmur    HTN (hypertension)    Hypercholesterolemia    Osteoarthritis of left hip 04/07/2014   Reflux    Spinal headache    with C-Section and with spinal fusion in 2019   Past Surgical History:  Procedure Laterality Date   CESAREAN SECTION     x3    COLONOSCOPY     LAMINECTOMY  03/2021   SPINAL FUSION  2019   TUBAL LIGATION      Allergies  Allergies  Allergen Reactions   Crestor [Rosuvastatin] Other (See Comments)    Myalgia    Robaxin [Methocarbamol] Other (See Comments)    Insomnia   Toradol [Ketorolac Tromethamine] Other (See Comments)   Zocor [Simvastatin] Other (See Comments)    Myalgias    Lipitor [Atorvastatin] Other (See Comments)    Myalgia   Sulfa Antibiotics Hives     EKGs/Labs/Other Studies Reviewed:   The following studies were reviewed today:  Nuclear med PET/CT scan 08/04/2022  LV perfusion is normal. There is no evidence of ischemia. There is no evidence of infarction.   Rest left ventricular function is normal. Rest EF: 62 %. Stress left ventricular function is normal. Stress EF: 63 %. End diastolic cavity size is normal.   Myocardial blood flow was computed to be 1.89ml/g/min at rest and 2.69ml/g/min at stress. Global myocardial blood flow reserve was 2.22 and was normal.   Coronary calcium was present on the attenuation correction CT images. Mild coronary calcifications were present. Coronary calcifications were present in the left anterior descending artery, left circumflex artery and right coronary artery distribution(s).   The study  is normal. The study is low risk.  Echocardiogram 01/2021  IMPRESSIONS     1. Left ventricular ejection fraction, by estimation, is 70 to 75%. The  left ventricle has hyperdynamic function. The left ventricle has no  regional wall motion abnormalities. There is mild concentric left  ventricular hypertrophy. Left ventricular  diastolic parameters were normal.   2. Right ventricular systolic function is normal. The right ventricular  size is normal.   3. The mitral valve is normal in structure. Trivial mitral valve  regurgitation. No evidence of mitral stenosis.   4. The aortic valve has an indeterminant number of cusps. There is  moderate calcification of the  aortic valve. There is mild thickening of  the aortic valve. Aortic valve regurgitation is mild. Moderate aortic  valve stenosis. Aortic valve area, by VTI  measures 1.14 cm. Aortic valve mean gradient measures 19.7 mmHg. Aortic  valve Vmax measures 3.04 m/s.   5. The inferior vena cava is normal in size with greater than 50%  respiratory variability, suggesting right atrial pressure of 3 mmHg.   6. Intra-atrial shunting cannot be excluded.   Comparison(s): No significant change from prior study. Prior echo moderate  AS; mean gradient slightly higher at 31 mmHg on prior study.   EKG:  EKG is not ordered today.  Recent Labs: 08/03/2022: ALT 9; BUN 31; Creat 1.36; Hemoglobin 10.7; Platelets 391; Potassium 4.5; Sodium 142  Recent Lipid Panel    Component Value Date/Time   CHOL 247 (H) 09/30/2017 1554   TRIG 179 (H) 09/30/2017 1554   HDL 50 09/30/2017 1554   CHOLHDL 4.9 (H) 09/30/2017 1554   CHOLHDL 3.2 11/12/2015 1436   VLDL 28 11/12/2015 1436   LDLCALC 161 (H) 09/30/2017 1554    Home Medications   No outpatient medications have been marked as taking for the 08/19/22 encounter (Appointment) with Elgie Collard, PA-C.     Review of Systems      All other systems reviewed and are otherwise negative except as noted above.  Physical Exam    VS:  There were no vitals taken for this visit. , BMI There is no height or weight on file to calculate BMI.  Wt Readings from Last 3 Encounters:  08/03/22 180 lb (81.6 kg)  05/05/22 176 lb (79.8 kg)  04/07/22 173 lb (78.5 kg)     GEN: Well nourished, well developed, in no acute distress. HEENT: normal. Neck: Supple, no JVD, carotid bruits, or masses. Cardiac: RRR, + 5/6 systolic murmur radiates to the carotids, rubs, or gallops. No clubbing, cyanosis, edema.  Radials/PT 2+ and equal bilaterally.  Respiratory:  Respirations regular and unlabored, clear to auscultation bilaterally. GI: Soft, nontender, nondistended. MS: No deformity or  atrophy. Skin: Warm and dry, no rash. Neuro:  Strength and sensation are intact. Psych: Normal affect.  Assessment & Plan    Chest pain after a meal -She called the office and Dr. Roosvelt Harps recommended follow-up NM PET CT, echocardiogram, and initiation of PPI by PCP.  Hypertension -Well-controlled today in the clinic -Continue current medication regimen which includes metoprolol succinate 100 mg daily, Hyzaar 100-25 mg daily, and Norvasc 10 mg daily  Hyperlipidemia -She will need a updated lipid panel when she is next in the office -last lipid panel 2020, LDL 69  Moderate AS -follow-up echo 8/18 reviewed with the patient  No BP recorded.  {Refresh Note OR Click here to enter BP  :1}***   ADDENDUM: I discussed her echocardiogram with Dr. Johnsie Cancel  and he has released her to get her surgery done on Wednesday I will fax this to the neurosurgeons office.   Disposition: Follow up 1 year with Jenkins Rouge, MD or APP.  Signed, Elgie Collard, PA-C 08/18/2022, 7:43 PM [facility]

## 2022-08-19 ENCOUNTER — Ambulatory Visit: Payer: Medicare HMO | Admitting: Physician Assistant

## 2022-08-19 DIAGNOSIS — I10 Essential (primary) hypertension: Secondary | ICD-10-CM

## 2022-08-19 DIAGNOSIS — I350 Nonrheumatic aortic (valve) stenosis: Secondary | ICD-10-CM

## 2022-08-19 DIAGNOSIS — R079 Chest pain, unspecified: Secondary | ICD-10-CM

## 2022-08-19 DIAGNOSIS — E785 Hyperlipidemia, unspecified: Secondary | ICD-10-CM

## 2022-08-30 ENCOUNTER — Other Ambulatory Visit (HOSPITAL_COMMUNITY): Payer: Self-pay

## 2022-09-02 ENCOUNTER — Other Ambulatory Visit (HOSPITAL_COMMUNITY): Payer: Self-pay

## 2022-09-02 MED ORDER — OZEMPIC (1 MG/DOSE) 4 MG/3ML ~~LOC~~ SOPN
1.0000 mg | PEN_INJECTOR | SUBCUTANEOUS | 2 refills | Status: DC
  Filled 2022-09-02: qty 3, 28d supply, fill #0

## 2022-09-06 ENCOUNTER — Other Ambulatory Visit (HOSPITAL_COMMUNITY): Payer: Self-pay

## 2022-09-20 ENCOUNTER — Ambulatory Visit (HOSPITAL_COMMUNITY): Payer: Medicare HMO | Attending: Cardiovascular Disease

## 2022-09-20 DIAGNOSIS — R072 Precordial pain: Secondary | ICD-10-CM

## 2022-09-20 DIAGNOSIS — I350 Nonrheumatic aortic (valve) stenosis: Secondary | ICD-10-CM

## 2022-09-20 LAB — ECHOCARDIOGRAM COMPLETE
AR max vel: 0.84 cm2
AV Area VTI: 1.13 cm2
AV Area mean vel: 0.85 cm2
AV Mean grad: 31.5 mmHg
AV Peak grad: 61.5 mmHg
Ao pk vel: 3.92 m/s
Area-P 1/2: 4.71 cm2
S' Lateral: 2.2 cm

## 2022-09-21 ENCOUNTER — Other Ambulatory Visit (HOSPITAL_COMMUNITY): Payer: Self-pay

## 2022-09-22 ENCOUNTER — Telehealth: Payer: Self-pay | Admitting: Cardiovascular Disease

## 2022-09-22 NOTE — Telephone Encounter (Signed)
Left voicemail for patient to return call to office. 

## 2022-09-22 NOTE — Telephone Encounter (Signed)
Patient is returning call to discuss echo results. °

## 2022-09-24 ENCOUNTER — Encounter (HOSPITAL_COMMUNITY): Payer: Self-pay

## 2022-09-24 ENCOUNTER — Telehealth: Payer: Self-pay

## 2022-09-24 DIAGNOSIS — I350 Nonrheumatic aortic (valve) stenosis: Secondary | ICD-10-CM

## 2022-09-24 NOTE — Telephone Encounter (Signed)
The patient has been notified of the result and verbalized understanding.  All questions (if any) were answered. Cindi Carbon Cody, RN 09/24/2022 10:37 AM   Will order echo to have done in 6 months since patient is feeling good.

## 2022-09-24 NOTE — Telephone Encounter (Signed)
-----   Message from Wendall Stade, MD sent at 09/20/2022  4:25 PM EDT ----- AS moderate to severe stable gradients f/u TTE 6 months if feeling good

## 2022-09-27 NOTE — Telephone Encounter (Signed)
See result note. Patient aware of results of recent echo.

## 2022-09-30 ENCOUNTER — Ambulatory Visit: Payer: Medicare HMO | Admitting: Internal Medicine

## 2022-10-05 ENCOUNTER — Other Ambulatory Visit (HOSPITAL_COMMUNITY): Payer: Self-pay

## 2022-10-05 MED ORDER — OZEMPIC (1 MG/DOSE) 4 MG/3ML ~~LOC~~ SOPN
1.0000 mg | PEN_INJECTOR | SUBCUTANEOUS | 2 refills | Status: DC
  Filled 2022-10-05: qty 3, 28d supply, fill #0
  Filled 2022-11-04: qty 3, 28d supply, fill #1
  Filled 2022-12-03: qty 3, 28d supply, fill #2

## 2022-10-06 ENCOUNTER — Ambulatory Visit: Payer: Medicare HMO | Admitting: Internal Medicine

## 2022-10-06 ENCOUNTER — Other Ambulatory Visit (HOSPITAL_COMMUNITY): Payer: Self-pay

## 2022-10-09 ENCOUNTER — Other Ambulatory Visit: Payer: Self-pay | Admitting: Physician Assistant

## 2022-10-09 ENCOUNTER — Ambulatory Visit (HOSPITAL_COMMUNITY)
Admission: EM | Admit: 2022-10-09 | Discharge: 2022-10-09 | Disposition: A | Discharge status: Home / Self Care | Payer: Medicare HMO | Attending: Physician Assistant | Admitting: Physician Assistant

## 2022-10-09 ENCOUNTER — Encounter (HOSPITAL_COMMUNITY): Payer: Self-pay

## 2022-10-09 DIAGNOSIS — J302 Other seasonal allergic rhinitis: Secondary | ICD-10-CM

## 2022-10-09 MED ORDER — FLUTICASONE PROPIONATE 50 MCG/ACT NA SUSP: 2.0000 | Freq: Every day | NASAL | 0 refills | Status: AC

## 2022-10-09 MED ORDER — CETIRIZINE HCL 10 MG PO TABS: 10.0000 mg | ORAL_TABLET | Freq: Every day | ORAL | 0 refills | Status: AC

## 2022-10-09 NOTE — ED Triage Notes (Signed)
Patient c/o "cold sweat", nasal congestion and sore throat x 2 days.  Patient denies taking any medication for her symptoms.

## 2022-10-09 NOTE — ED Provider Notes (Signed)
[name]   MRN: [id number] DOB: [birth year]  Subjective:   [name] is a 67 y.o. female presenting for nasal congestion, runny nose, scratchy throat, cold sweats for the last few days.  Denies any high fevers.  No cough, shortness of breath, chest pain, nausea, vomiting, headache.  No sick contacts.  She has not been around children or public work spaces.  She has not tried anything for her symptoms yet.  No current facility-administered medications for this encounter.  Current Outpatient Medications:    fluticasone (FLONASE) 50 MCG/ACT nasal spray, Place 2 sprays into both nostrils daily., Disp: 16 g, Rfl: 0   acetaminophen (TYLENOL) 500 MG tablet, Take 1,000 mg by mouth at bedtime as needed for mild pain or headache., Disp: , Rfl:    albuterol (PROAIR HFA) 108 (90 BASE) MCG/ACT inhaler, Inhale 2 puffs into the lungs every 6 (six) hours as needed., Disp: 1 Inhaler, Rfl: 12   amLODipine (NORVASC) 10 MG tablet, Take 1 tablet (10 mg total) by mouth daily., Disp: 90 tablet, Rfl: 1   cefadroxil (DURICEF) 1 g tablet, Take 1 tablet (1 g total) by mouth 2 (two) times daily. (Patient not taking: Reported on 04/07/2022), Disp: 60 tablet, Rfl: 0   celecoxib (CELEBREX) 200 MG capsule, Take 200 mg by mouth 2 (two) times daily. , Disp: , Rfl: 1   cetirizine (ZYRTEC) 10 MG tablet, Take 1 tablet (10 mg total) by mouth daily., Disp: 30 tablet, Rfl: 0   cyanocobalamin (VITAMIN B12) 1000 MCG tablet, Take 1,000 mcg by mouth daily. (Patient not taking: Reported on 04/07/2022), Disp: , Rfl:    docusate sodium (COLACE) 100 MG capsule, Take 100 mg by mouth 2 (two) times daily., Disp: , Rfl:    gabapentin (NEURONTIN) 600 MG tablet, Take 600 mg by mouth 3 (three) times daily., Disp: , Rfl:    iron polysaccharides (NIFEREX) 150 MG capsule, TAKE 1 CAPSULE (150 MG DOSE) BY MOUTH TWICE A DAY (Patient taking differently: Take 150 mg by mouth daily.), Disp: 180 capsule, Rfl: 1    losartan-hydrochlorothiazide (HYZAAR) 100-25 MG tablet, Take 1 tablet by mouth daily., Disp: 90 tablet, Rfl: 1   metFORMIN (GLUCOPHAGE) 1000 MG tablet, Take 1 tablet (1,000 mg total) by mouth 2 (two) times daily with a meal., Disp: 180 tablet, Rfl: 1   metoprolol succinate (TOPROL-XL) 100 MG 24 hr tablet, Take 1 tablet (100 mg total) by mouth daily. (Patient taking differently: Take 100 mg by mouth at bedtime.), Disp: 90 tablet, Rfl: 1   ondansetron (ZOFRAN) 8 MG tablet, Take 1 tablet (8 mg total) by mouth every 8 (eight) hours as needed for nausea or vomiting., Disp: 20 tablet, Rfl: 3   oxyCODONE (OXY IR/ROXICODONE) 5 MG immediate release tablet, Take 1 tablet (5 mg total) by mouth every 6 (six) hours as needed for moderate pain ((score 4 to 6)). (Patient not taking: Reported on 08/03/2022), Disp: 30 tablet, Rfl: 0   oxyCODONE (OXY IR/ROXICODONE) 5 MG immediate release tablet, Take 1 tablet (5 mg total) by mouth every 6 (six) hours as needed for moderate pain. (Patient not taking: Reported on 08/03/2022), Disp: 30 tablet, Rfl: 0   pantoprazole (PROTONIX) 40 MG tablet, Take 40 mg by mouth daily., Disp: , Rfl:    Semaglutide, 1 MG/DOSE, (OZEMPIC, 1 MG/DOSE,) 4 MG/3ML SOPN, Inject 1 mg into the skin once a week., Disp: 3 mL, Rfl: 2   Semaglutide, 1 MG/DOSE, (OZEMPIC, 1 MG/DOSE,) 4 MG/3ML SOPN, Inject  1 mg into the skin once a week., Disp: 3 mL, Rfl: 2   sulfamethoxazole-trimethoprim (BACTRIM) 400-80 MG tablet, Take 1 tablet by mouth 2 (two) times daily., Disp: 60 tablet, Rfl: 11   tiZANidine (ZANAFLEX) 4 MG tablet, Take 4 mg by mouth at bedtime as needed for muscle spasms., Disp: , Rfl:    Allergies  Allergen Reactions   Crestor [Rosuvastatin] Other (See Comments)    Myalgia    Robaxin [Methocarbamol] Other (See Comments)    Insomnia   Toradol [Ketorolac Tromethamine] Other (See Comments)   Zocor [Simvastatin] Other (See Comments)    Myalgias    Lipitor [Atorvastatin] Other (See Comments)     Myalgia   Sulfa Antibiotics Hives    Past Medical History:  Diagnosis Date   Allergy    Anemia    Anxiety    Aortic stenosis    moderate AS by echo 12/2021   Arthritis    Asthma    BMI 40.0-44.9, adult (HCC) 04/07/2014   DM type 2 (diabetes mellitus, type 2) (HCC)    Dyspnea    GERD (gastroesophageal reflux disease)    Heart murmur    HTN (hypertension)    Hypercholesterolemia    Osteoarthritis of left hip 04/07/2014   Reflux    Spinal headache    with C-Section and with spinal fusion in 2019     Past Surgical History:  Procedure Laterality Date   CESAREAN SECTION     x3   COLONOSCOPY     LAMINECTOMY  03/2021   SPINAL FUSION  2019   TUBAL LIGATION      Family History  Problem Relation Age of Onset   Hypertension Mother    Stroke Mother    Heart disease Father    Stroke Brother    Multiple sclerosis Brother    Multiple sclerosis Sister    Colon cancer Neg Hx    Colon polyps Neg Hx    Esophageal cancer Neg Hx    Rectal cancer Neg Hx    Stomach cancer Neg Hx     Social History   Tobacco Use   Smoking status: Never   Smokeless tobacco: Never  Vaping Use   Vaping Use: Never used  Substance Use Topics   Alcohol use: No    Alcohol/week: 0.0 standard drinks of alcohol   Drug use: No    ROS REFER TO HPI FOR PERTINENT POSITIVES AND NEGATIVES   Objective:   Vitals: BP (!) 155/83 (BP Location: Right Arm)   Pulse 81   Temp 98.7 F (37.1 C) (Oral)   Resp 16   SpO2 97%   Physical Exam Vitals and nursing note reviewed.  Constitutional:      General: She is not in acute distress.    Appearance: Normal appearance. She is not ill-appearing.  HENT:     Head: Normocephalic.     Right Ear: Tympanic membrane, ear canal and external ear normal.     Left Ear: Tympanic membrane, ear canal and external ear normal.     Nose: Congestion present.     Mouth/Throat:     Mouth: Mucous membranes are moist.     Pharynx: No oropharyngeal exudate or posterior  oropharyngeal erythema.  Eyes:     Extraocular Movements: Extraocular movements intact.     Conjunctiva/sclera: Conjunctivae normal.     Pupils: Pupils are equal, round, and reactive to light.  Cardiovascular:     Rate and Rhythm: Normal rate and regular rhythm.  Pulses: Normal pulses.     Heart sounds: Murmur (previously known) heard.  Pulmonary:     Effort: Pulmonary effort is normal. No respiratory distress.     Breath sounds: Normal breath sounds. No wheezing.  Musculoskeletal:     Cervical back: Normal range of motion.  Skin:    General: Skin is warm.     Findings: No rash.  Neurological:     Mental Status: She is alert and oriented to person, place, and time.  Psychiatric:        Mood and Affect: Mood normal.        Behavior: Behavior normal.     No results found for this or any previous visit (from the past 24 hour(s)).  Assessment and Plan :   PDMP not reviewed this encounter.  1. Seasonal allergies    Reassured patient that symptoms are consistent with seasonal allergies or possibly viral etiology.  Plan to treat conservatively at this time with Zyrtec 10 mg and Flonase as directed.  She can use nasal saline.  She can use Tylenol or ibuprofen if needed.  Return precautions discussed with patient.  She is agreeable and understanding to plan.     AllwardtCrist Infante, PA-C 10/09/22 1228

## 2022-10-09 NOTE — Discharge Instructions (Signed)
Good to meet you today.  Your symptoms are consistent with seasonal allergies.  Please take the cetirizine 10 mg as directed.  Use the Flonase nasal spray, aim upward and outward in the nasal canal.  Be sure to stay well-hydrated.  You can take Tylenol or ibuprofen if you need for aches or chills.  Return to care if worsening or persistent symptoms.

## 2022-10-11 NOTE — Progress Notes (Unsigned)
Office Visit    Patient Name: [name] Date of Encounter: 10/12/2022  PCP:  [name], MD    [facility]  Cardiologist:  Charlton Haws, MD  Advanced Practice Provider:  No care team member to display Electrophysiologist:  None  HPI    [name] is a 67 y.o. female with past medical history significant for moderate aortic stenosis, anxiety, asthma, obesity, diabetes mellitus type 2, GERD, hypertension, hyperlipidemia presents today for overdue follow-up appointment.  She was last seen in 03/2020 and at that time she had been on disability since her spinal fusion in 2019.  She had a history of moderate aortic stenosis with TTE done 03/13/2020 with mean gradient 31 mmHg peak 48 mmHg and mild AR.  She had no history of CAD Myoview done 03/26/2019 with no ischemia EF 60%.  She was seen by me 8/23 for cardiac clearance, she states she is not having any cardiovascular symptoms today.  She does have a blood pressure cuff at home and tries to monitor.  She states next week she is having back surgery.  Our office has not received any cardiac clearance for her surgery.  However, we did go through the [facility] today and she barely met our 4 METS minimum requirement.  She scored a 4.31 METS.  It appears that her back limits her more than her cardiovascular health.  She uses a walker. She has a bad slipped disk  Today, she tells me that her BP has been staying high. [facility]. She has been doing a seated ellipital and different resistance machines. She has been making an effort to stay active. Otherwise, we reviewed her PET/CT and her echo today.  Reports no shortness of breath nor dyspnea on exertion. Reports no chest pain, pressure, or tightness. No edema, orthopnea, PND. Reports no palpitations.   Past Medical History    Past Medical History:  Diagnosis Date   Allergy    Anemia    Anxiety     Aortic stenosis    moderate AS by echo 12/2021   Arthritis    Asthma    BMI 40.0-44.9, adult (HCC) 04/07/2014   DM type 2 (diabetes mellitus, type 2) (HCC)    Dyspnea    GERD (gastroesophageal reflux disease)    Heart murmur    HTN (hypertension)    Hypercholesterolemia    Osteoarthritis of left hip 04/07/2014   Reflux    Spinal headache    with C-Section and with spinal fusion in 2019   Past Surgical History:  Procedure Laterality Date   CESAREAN SECTION     x3   COLONOSCOPY     LAMINECTOMY  03/2021   SPINAL FUSION  2019   TUBAL LIGATION      Allergies  Allergies  Allergen Reactions   Crestor [Rosuvastatin] Other (See Comments)    Myalgia    Robaxin [Methocarbamol] Other (See Comments)    Insomnia   Toradol [Ketorolac Tromethamine] Other (See Comments)   Zocor [Simvastatin] Other (See Comments)    Myalgias    Lipitor [Atorvastatin] Other (See Comments)    Myalgia   Sulfa Antibiotics Hives     EKGs/Labs/Other Studies Reviewed:   The following studies were reviewed today:  Echocardiogram 01/2021  IMPRESSIONS     1. Left ventricular ejection fraction, by estimation, is 70 to 75%. The  left ventricle has hyperdynamic function. The left ventricle has  no  regional wall motion abnormalities. There is mild concentric left  ventricular hypertrophy. Left ventricular  diastolic parameters were normal.   2. Right ventricular systolic function is normal. The right ventricular  size is normal.   3. The mitral valve is normal in structure. Trivial mitral valve  regurgitation. No evidence of mitral stenosis.   4. The aortic valve has an indeterminant number of cusps. There is  moderate calcification of the aortic valve. There is mild thickening of  the aortic valve. Aortic valve regurgitation is mild. Moderate aortic  valve stenosis. Aortic valve area, by VTI  measures 1.14 cm. Aortic valve mean gradient measures 19.7 mmHg. Aortic  valve Vmax measures 3.04 m/s.    5. The inferior vena cava is normal in size with greater than 50%  respiratory variability, suggesting right atrial pressure of 3 mmHg.   6. Intra-atrial shunting cannot be excluded.   Comparison(s): No significant change from prior study. Prior echo moderate  AS; mean gradient slightly higher at 31 mmHg on prior study.   EKG:  EKG is not ordered today.  Recent Labs: 08/03/2022: ALT 9; BUN 31; Creat 1.36; Hemoglobin 10.7; Platelets 391; Potassium 4.5; Sodium 142  Recent Lipid Panel    Component Value Date/Time   CHOL 247 (H) 09/30/2017 1554   TRIG 179 (H) 09/30/2017 1554   HDL 50 09/30/2017 1554   CHOLHDL 4.9 (H) 09/30/2017 1554   CHOLHDL 3.2 11/12/2015 1436   VLDL 28 11/12/2015 1436   LDLCALC 161 (H) 09/30/2017 1554    Home Medications   Current Meds  Medication Sig   acetaminophen (TYLENOL) 500 MG tablet Take 1,000 mg by mouth at bedtime as needed for mild pain or headache.   albuterol (PROAIR HFA) 108 (90 BASE) MCG/ACT inhaler Inhale 2 puffs into the lungs every 6 (six) hours as needed.   amLODipine (NORVASC) 10 MG tablet Take 1 tablet (10 mg total) by mouth daily.   celecoxib (CELEBREX) 200 MG capsule Take 200 mg by mouth 2 (two) times daily.    cetirizine (ZYRTEC) 10 MG tablet Take 1 tablet (10 mg total) by mouth daily.   docusate sodium (COLACE) 100 MG capsule Take 100 mg by mouth 2 (two) times daily.   fluticasone (FLONASE) 50 MCG/ACT nasal spray Place 2 sprays into both nostrils daily.   gabapentin (NEURONTIN) 600 MG tablet Take 600 mg by mouth 3 (three) times daily.   metFORMIN (GLUCOPHAGE) 1000 MG tablet Take 1 tablet (1,000 mg total) by mouth 2 (two) times daily with a meal.   metoprolol succinate (TOPROL-XL) 100 MG 24 hr tablet Take 1 tablet (100 mg total) by mouth daily. (Patient taking differently: Take 100 mg by mouth at bedtime.)   ondansetron (ZOFRAN) 8 MG tablet Take 1 tablet (8 mg total) by mouth every 8 (eight) hours as needed for nausea or vomiting.    pantoprazole (PROTONIX) 40 MG tablet Take 40 mg by mouth daily.   Semaglutide, 1 MG/DOSE, (OZEMPIC, 1 MG/DOSE,) 4 MG/3ML SOPN Inject 1 mg into the skin once a week.   sulfamethoxazole-trimethoprim (BACTRIM) 400-80 MG tablet Take 1 tablet by mouth 2 (two) times daily.   tiZANidine (ZANAFLEX) 4 MG tablet Take 4 mg by mouth at bedtime as needed for muscle spasms.   valsartan-hydrochlorothiazide (DIOVAN HCT) 160-25 MG tablet Take 1 tablet by mouth daily.   [DISCONTINUED] losartan-hydrochlorothiazide (HYZAAR) 100-25 MG tablet Take 1 tablet by mouth daily.     Review of Systems      All other  systems reviewed and are otherwise negative except as noted above.  Physical Exam    VS:  BP (!) 158/82   Pulse 89   Ht 4\' 9"  (1.448 m)   Wt 186 lb (84.4 kg)   SpO2 97%   BMI 40.25 kg/m  , BMI Body mass index is 40.25 kg/m.  Wt Readings from Last 3 Encounters:  10/12/22 186 lb (84.4 kg)  08/03/22 180 lb (81.6 kg)  05/05/22 176 lb (79.8 kg)     GEN: Well nourished, well developed, in no acute distress. HEENT: normal. Neck: Supple, no JVD, carotid bruits, or masses. Cardiac: RRR, + 5/6 systolic murmur radiates to the carotids, rubs, or gallops. No clubbing, cyanosis, edema.  Radials/PT 2+ and equal bilaterally.  Respiratory:  Respirations regular and unlabored, clear to auscultation bilaterally. GI: Soft, nontender, nondistended. MS: No deformity or atrophy. Skin: Warm and dry, no rash. Neuro:  Strength and sensation are intact. Psych: Normal affect.  Assessment & Plan   Hypertension -Well-controlled today in the clinic -Continue current medication regimen which includes metoprolol succinate 100 mg daily, and Norvasc 10 mg daily -change Hyzaar to Diovan -please keep track of BP for 2 weeks an hour after morning medications and send those values to me  Hyperlipidemia -She will need a updated lipid panel when she is next in the office -fasting today lipid panel, LFTs ordered  Moderate  AS -echo reviewed with patient -Oct repeat echo  -she is asymptomatic at this time  Disposition: Follow up 6 months with Charlton Haws, MD or APP.  Signed, Sharlene Dory, PA-C 10/12/2022, 1:19 PM  [facility]

## 2022-10-12 ENCOUNTER — Ambulatory Visit: Payer: Medicare HMO | Attending: Physician Assistant | Admitting: Physician Assistant

## 2022-10-12 ENCOUNTER — Other Ambulatory Visit (HOSPITAL_COMMUNITY): Payer: Self-pay

## 2022-10-12 ENCOUNTER — Encounter: Payer: Self-pay | Admitting: Physician Assistant

## 2022-10-12 VITALS — BP 158/82 | HR 89 | Ht <= 58 in | Wt 186.0 lb

## 2022-10-12 DIAGNOSIS — I350 Nonrheumatic aortic (valve) stenosis: Secondary | ICD-10-CM

## 2022-10-12 DIAGNOSIS — E782 Mixed hyperlipidemia: Secondary | ICD-10-CM

## 2022-10-12 DIAGNOSIS — I10 Essential (primary) hypertension: Secondary | ICD-10-CM

## 2022-10-12 DIAGNOSIS — I2510 Atherosclerotic heart disease of native coronary artery without angina pectoris: Secondary | ICD-10-CM

## 2022-10-12 DIAGNOSIS — R072 Precordial pain: Secondary | ICD-10-CM | POA: Diagnosis not present

## 2022-10-12 LAB — LIPID PANEL
Chol/HDL Ratio: 5.7 ratio — ABNORMAL HIGH (ref 0.0–4.4)
Cholesterol, Total: 263 mg/dL — ABNORMAL HIGH (ref 100–199)
HDL: 46 mg/dL (ref >39)
LDL Chol Calc (NIH): 193 mg/dL — ABNORMAL HIGH (ref 0–99)
Triglycerides: 131 mg/dL (ref 0–149)
VLDL Cholesterol Cal: 24 mg/dL (ref 5–40)

## 2022-10-12 LAB — HEPATIC FUNCTION PANEL
ALT: 15 IU/L (ref 0–32)
AST: 17 IU/L (ref 0–40)
Albumin: 4.7 g/dL (ref 3.9–4.9)
Alkaline Phosphatase: 88 IU/L (ref 44–121)
Bilirubin Total: <0.2 mg/dL (ref 0.0–1.2)
Bilirubin, Direct: <0.1 mg/dL (ref 0.00–0.40)
Total Protein: 7.8 g/dL (ref 6.0–8.5)

## 2022-10-12 MED ORDER — VALSARTAN-HYDROCHLOROTHIAZIDE 160-25 MG PO TABS
1.0000 | ORAL_TABLET | Freq: Every day | ORAL | 3 refills | Status: DC
  Filled 2022-10-12: qty 90, 90d supply, fill #0

## 2022-10-12 NOTE — Patient Instructions (Signed)
Medication Instructions:  1.Stop Hyzaar 2.Start Diovan 160-25 mg daily *If you need a refill on your cardiac medications before your next appointment, please call your pharmacy*  Lab Work: Lipids and lft's--TODAY If you have labs (blood work) drawn today and your tests are completely normal, you will receive your results only by: MyChart Message (if you have MyChart) OR A paper copy in the mail If you have any lab test that is abnormal or we need to change your treatment, we will call you to review the results.  Testing/Procedures: Your physician has requested that you have an echocardiogram in October 2024. Echocardiography is a painless test that uses sound waves to create images of your heart. It provides your doctor with information about the size and shape of your heart and how well your heart's chambers and valves are working. This procedure takes approximately one hour. There are no restrictions for this procedure. Please do NOT wear cologne, perfume, aftershave, or lotions (deodorant is allowed). Please arrive 15 minutes prior to your appointment time.    Follow-Up: At [facility], you and your health needs are our priority.  As part of our continuing mission to provide you with exceptional heart care, we have created designated Provider Care Teams.  These Care Teams include your primary Cardiologist (physician) and Advanced Practice Providers (APPs -  Physician Assistants and Nurse Practitioners) who all work together to provide you with the care you need, when you need it.  Your next appointment:   6 month(s)  Provider:   Charlton Haws, MD   Other Instructions Check your blood pressure daily, 1 hr after morning medications for 2 weeks, keep a log and send [place] the readings through mychart at the end of the 2 weeks.   Low-Sodium Eating Plan Sodium, which is an element that makes up salt, helps you maintain a healthy balance of fluids in your body. Too much sodium can  increase your blood pressure and cause fluid and waste to be held in your body. Your health care provider or dietitian may recommend following this plan if you have high blood pressure (hypertension), kidney disease, liver disease, or heart failure. Eating less sodium can help lower your blood pressure, reduce swelling, and protect your heart, liver, and kidneys. What are tips for following this plan? Reading food labels The Nutrition Facts label lists the amount of sodium in one serving of the food. If you eat more than one serving, you must multiply the listed amount of sodium by the number of servings. Choose foods with less than 140 mg of sodium per serving. Avoid foods with 300 mg of sodium or more per serving. Shopping  Look for lower-sodium products, often labeled as "low-sodium" or "no salt added." Always check the sodium content, even if foods are labeled as "unsalted" or "no salt added." Buy fresh foods. Avoid canned foods and pre-made or frozen meals. Avoid canned, cured, or processed meats. Buy breads that have less than 80 mg of sodium per slice. Cooking  Eat more home-cooked food and less restaurant, buffet, and fast food. Avoid adding salt when cooking. Use salt-free seasonings or herbs instead of table salt or sea salt. Check with your health care provider or pharmacist before using salt substitutes. Cook with plant-based oils, such as canola, sunflower, or olive oil. Meal planning When eating at a restaurant, ask that your food be prepared with less salt or no salt, if possible. Avoid dishes labeled as brined, pickled, cured, smoked, or made with  soy sauce, miso, or teriyaki sauce. Avoid foods that contain MSG (monosodium glutamate). MSG is sometimes added to [place] food, bouillon, and some canned foods. Make meals that can be grilled, baked, poached, roasted, or steamed. These are generally made with less sodium. General information Most people on this plan should limit  their sodium intake to 1,500-2,000 mg (milligrams) of sodium each day. What foods should I eat? Fruits Fresh, frozen, or canned fruit. Fruit juice. Vegetables Fresh or frozen vegetables. "No salt added" canned vegetables. "No salt added" tomato sauce and paste. Low-sodium or reduced-sodium tomato and vegetable juice. Grains Low-sodium cereals, including oats, puffed wheat and rice, and shredded wheat. Low-sodium crackers. Unsalted rice. Unsalted pasta. Low-sodium bread. Whole-grain breads and whole-grain pasta. Meats and other proteins Fresh or frozen (no salt added) meat, poultry, seafood, and fish. Low-sodium canned tuna and salmon. Unsalted nuts. Dried peas, beans, and lentils without added salt. Unsalted canned beans. Eggs. Unsalted nut butters. Dairy Milk. Soy milk. Cheese that is naturally low in sodium, such as ricotta cheese, fresh mozzarella, or Swiss cheese. Low-sodium or reduced-sodium cheese. Cream cheese. Yogurt. Seasonings and condiments Fresh and dried herbs and spices. Salt-free seasonings. Low-sodium mustard and ketchup. Sodium-free salad dressing. Sodium-free light mayonnaise. Fresh or refrigerated horseradish. Lemon juice. Vinegar. Other foods Homemade, reduced-sodium, or low-sodium soups. Unsalted popcorn and pretzels. Low-salt or salt-free chips. The items listed above may not be a complete list of foods and beverages you can eat. Contact a dietitian for more information. What foods should I avoid? Vegetables Sauerkraut, pickled vegetables, and relishes. Olives. [place] fries. Onion rings. Regular canned vegetables (not low-sodium or reduced-sodium). Regular canned tomato sauce and paste (not low-sodium or reduced-sodium). Regular tomato and vegetable juice (not low-sodium or reduced-sodium). Frozen vegetables in sauces. Grains Instant hot cereals. Bread stuffing, pancake, and biscuit mixes. Croutons. Seasoned rice or pasta mixes. Noodle soup cups. Boxed or frozen macaroni  and cheese. Regular salted crackers. Self-rising flour. Meats and other proteins Meat or fish that is salted, canned, smoked, spiced, or pickled. Precooked or cured meat, such as sausages or meat loaves. Tomasa Blase. Ham. Pepperoni. Hot dogs. Corned beef. Chipped beef. Salt pork. Jerky. Pickled herring. Anchovies and sardines. Regular canned tuna. Salted nuts. Dairy Processed cheese and cheese spreads. Hard cheeses. Cheese curds. Blue cheese. Feta cheese. String cheese. Regular cottage cheese. Buttermilk. Canned milk. Fats and oils Salted butter. Regular margarine. Ghee. Bacon fat. Seasonings and condiments Onion salt, garlic salt, seasoned salt, table salt, and sea salt. Canned and packaged gravies. Worcestershire sauce. Tartar sauce. Barbecue sauce. Teriyaki sauce. Soy sauce, including reduced-sodium. Steak sauce. Fish sauce. Oyster sauce. Cocktail sauce. Horseradish that you find on the shelf. Regular ketchup and mustard. Meat flavorings and tenderizers. Bouillon cubes. Hot sauce. Pre-made or packaged marinades. Pre-made or packaged taco seasonings. Relishes. Regular salad dressings. Salsa. Other foods Salted popcorn and pretzels. Corn chips and puffs. Potato and tortilla chips. Canned or dried soups. Pizza. Frozen entrees and pot pies. The items listed above may not be a complete list of foods and beverages you should avoid. Contact a dietitian for more information. Summary Eating less sodium can help lower your blood pressure, reduce swelling, and protect your heart, liver, and kidneys. Most people on this plan should limit their sodium intake to 1,500-2,000 mg (milligrams) of sodium each day. Canned, boxed, and frozen foods are high in sodium. Restaurant foods, fast foods, and pizza are also very high in sodium. You also get sodium by adding salt to food. Try to cook  at home, eat more fresh fruits and vegetables, and eat less fast food and canned, processed, or prepared foods. This information is  not intended to replace advice given to you by your health care provider. Make sure you discuss any questions you have with your health care provider. Document Revised: 04/16/2019 Document Reviewed: 04/11/2019 Elsevier Patient Education  2023 [employer]   Heart-Healthy Eating Plan Many factors influence your heart health, including eating and exercise habits. Heart health is also called coronary health. Coronary risk increases with abnormal blood fat (lipid) levels. A heart-healthy eating plan includes limiting unhealthy fats, increasing healthy fats, limiting salt (sodium) intake, and making other diet and lifestyle changes. What is my plan? Your health care provider may recommend that: You limit your fat intake to _________% or less of your total calories each day. You limit your saturated fat intake to _________% or less of your total calories each day. You limit the amount of cholesterol in your diet to less than _________ mg per day. You limit the amount of sodium in your diet to less than _________ mg per day. What are tips for following this plan? Cooking Cook foods using methods other than frying. Baking, boiling, grilling, and broiling are all good options. Other ways to reduce fat include: Removing the skin from poultry. Removing all visible fats from meats. Steaming vegetables in water or broth. Meal planning  At meals, imagine dividing your plate into fourths: Fill one-half of your plate with vegetables and green salads. Fill one-fourth of your plate with whole grains. Fill one-fourth of your plate with lean protein foods. Eat 2-4 cups of vegetables per day. One cup of vegetables equals 1 cup (91 g) broccoli or cauliflower florets, 2 medium carrots, 1 large bell pepper, 1 large sweet potato, 1 large tomato, 1 medium white potato, 2 cups (150 g) raw leafy greens. Eat 1-2 cups of fruit per day. One cup of fruit equals 1 small apple, 1 large banana, 1 cup (237 g) mixed fruit,  1 large orange,  cup (82 g) dried fruit, 1 cup (240 mL) 100% fruit juice. Eat more foods that contain soluble fiber. Examples include apples, broccoli, carrots, beans, peas, and barley. Aim to get 25-30 g of fiber per day. Increase your consumption of legumes, nuts, and seeds to 4-5 servings per week. One serving of dried beans or legumes equals  cup (90 g) cooked, 1 serving of nuts is  oz (12 almonds, 24 pistachios, or 7 walnut halves), and 1 serving of seeds equals  oz (8 g). Fats Choose healthy fats more often. Choose monounsaturated and polyunsaturated fats, such as olive and canola oils, avocado oil, flaxseeds, walnuts, almonds, and seeds. Eat more omega-3 fats. Choose salmon, mackerel, sardines, tuna, flaxseed oil, and ground flaxseeds. Aim to eat fish at least 2 times each week. Check food labels carefully to identify foods with trans fats or high amounts of saturated fat. Limit saturated fats. These are found in animal products, such as meats, butter, and cream. Plant sources of saturated fats include palm oil, palm kernel oil, and coconut oil. Avoid foods with partially hydrogenated oils in them. These contain trans fats. Examples are stick margarine, some tub margarines, cookies, crackers, and other baked goods. Avoid fried foods. General information Eat more home-cooked food and less restaurant, buffet, and fast food. Limit or avoid alcohol. Limit foods that are high in added sugar and simple starches such as foods made using white refined flour (white breads, pastries, sweets). Lose weight  if you are overweight. Losing just 5-10% of your body weight can help your overall health and prevent diseases such as diabetes and heart disease. Monitor your sodium intake, especially if you have high blood pressure. Talk with your health care provider about your sodium intake. Try to incorporate more vegetarian meals weekly. What foods should I eat? Fruits All fresh, canned (in natural juice),  or frozen fruits. Vegetables Fresh or frozen vegetables (raw, steamed, roasted, or grilled). Green salads. Grains Most grains. Choose whole wheat and whole grains most of the time. Rice and pasta, including brown rice and pastas made with whole wheat. Meats and other proteins Lean, well-trimmed beef, veal, pork, and lamb. Chicken and [place] without skin. All fish and shellfish. Wild duck, rabbit, pheasant, and venison. Egg whites or low-cholesterol egg substitutes. Dried beans, peas, lentils, and tofu. Seeds and most nuts. Dairy Low-fat or nonfat cheeses, including ricotta and mozzarella. Skim or 1% milk (liquid, powdered, or evaporated). Buttermilk made with low-fat milk. Nonfat or low-fat yogurt. Fats and oils Non-hydrogenated (trans-free) margarines. Vegetable oils, including soybean, sesame, sunflower, olive, avocado, peanut, safflower, corn, canola, and cottonseed. Salad dressings or mayonnaise made with a vegetable oil. Beverages Water (mineral or sparkling). Coffee and tea. Unsweetened ice tea. Diet beverages. Sweets and desserts Sherbet, gelatin, and fruit ice. Small amounts of dark chocolate. Limit all sweets and desserts. Seasonings and condiments All seasonings and condiments. The items listed above may not be a complete list of foods and beverages you can eat. Contact a dietitian for more options. What foods should I avoid? Fruits Canned fruit in heavy syrup. Fruit in cream or butter sauce. Fried fruit. Limit coconut. Vegetables Vegetables cooked in cheese, cream, or butter sauce. Fried vegetables. Grains Breads made with saturated or trans fats, oils, or whole milk. Croissants. Sweet rolls. Donuts. High-fat crackers, such as cheese crackers and chips. Meats and other proteins Fatty meats, such as hot dogs, ribs, sausage, bacon, rib-eye roast or steak. High-fat deli meats, such as salami and bologna. Caviar. Domestic duck and goose. Organ meats, such as liver. Dairy Cream,  sour cream, cream cheese, and creamed cottage cheese. Whole-milk cheeses. Whole or 2% milk (liquid, evaporated, or condensed). Whole buttermilk. Cream sauce or high-fat cheese sauce. Whole-milk yogurt. Fats and oils Meat fat, or shortening. Cocoa butter, hydrogenated oils, palm oil, coconut oil, palm kernel oil. Solid fats and shortenings, including bacon fat, salt pork, lard, and butter. Nondairy cream substitutes. Salad dressings with cheese or sour cream. Beverages Regular sodas and any drinks with added sugar. Sweets and desserts Frosting. Pudding. Cookies. Cakes. Pies. Milk chocolate or white chocolate. Buttered syrups. Full-fat ice cream or ice cream drinks. The items listed above may not be a complete list of foods and beverages to avoid. Contact a dietitian for more information. Summary Heart-healthy meal planning includes limiting unhealthy fats, increasing healthy fats, limiting salt (sodium) intake and making other diet and lifestyle changes. Lose weight if you are overweight. Losing just 5-10% of your body weight can help your overall health and prevent diseases such as diabetes and heart disease. Focus on eating a balance of foods, including fruits and vegetables, low-fat or nonfat dairy, lean protein, nuts and legumes, whole grains, and heart-healthy oils and fats. This information is not intended to replace advice given to you by your health care provider. Make sure you discuss any questions you have with your health care provider. Document Revised: 06/15/2021 Document Reviewed: 06/15/2021 Elsevier Patient Education  2023 [employer].

## 2022-10-22 ENCOUNTER — Other Ambulatory Visit (HOSPITAL_COMMUNITY): Payer: Self-pay

## 2022-10-26 ENCOUNTER — Other Ambulatory Visit: Payer: Self-pay

## 2022-10-26 ENCOUNTER — Ambulatory Visit: Payer: Medicare HMO | Admitting: Internal Medicine

## 2022-10-26 VITALS — BP 153/84 | HR 85 | Temp 98.7°F | Resp 16 | Wt 187.0 lb

## 2022-10-26 DIAGNOSIS — T847XXD Infection and inflammatory reaction due to other internal orthopedic prosthetic devices, implants and grafts, subsequent encounter: Secondary | ICD-10-CM

## 2022-10-26 DIAGNOSIS — M4626 Osteomyelitis of vertebra, lumbar region: Secondary | ICD-10-CM | POA: Diagnosis not present

## 2022-10-26 DIAGNOSIS — T8149XA Infection following a procedure, other surgical site, initial encounter: Secondary | ICD-10-CM

## 2022-10-26 DIAGNOSIS — M4620 Osteomyelitis of vertebra, site unspecified: Secondary | ICD-10-CM

## 2022-10-26 LAB — COMPLETE METABOLIC PANEL WITH GFR
AG Ratio: 1.6 (calc) (ref 1.0–2.5)
ALT: 12 U/L (ref 6–29)
Creat: 1.58 mg/dL — ABNORMAL HIGH (ref 0.50–1.05)
Globulin: 3 g/dL (calc) (ref 1.9–3.7)
Glucose, Bld: 82 mg/dL (ref 65–99)
Potassium: 5.1 mmol/L (ref 3.5–5.3)
Total Protein: 7.7 g/dL (ref 6.1–8.1)

## 2022-10-26 LAB — CBC: Platelets: 338 10*3/uL (ref 140–400)

## 2022-10-26 MED ORDER — ONDANSETRON HCL 8 MG PO TABS: 8.0000 mg | ORAL_TABLET | Freq: Three times a day (TID) | ORAL | 5 refills | Status: AC | PRN

## 2022-10-26 NOTE — Patient Instructions (Signed)
Lets continue bactrim as is, until we have normal crp number over 6 months   I have refilled zofran for you in higher amount of pills each time   Labs today    See me in 6 months

## 2022-10-26 NOTE — Progress Notes (Signed)
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   Bacteremia due to Proteus species    Sepsis (HCC) 02/03/2022   Iron deficiency anemia 02/03/2022   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   S/P lumbar spinal fusion 11/17/2021   Lumbar radiculopathy 04/21/2021   Spondylolisthesis of lumbar region 02/01/2018   Gastroesophageal reflux disease without esophagitis 01/16/2018   Osteoarthritis of left hip 04/07/2014   BMI 40.0-44.9, adult (HCC) 04/07/2014   DM type 2 (diabetes mellitus, type 2) (HCC)    HTN (hypertension)    Hypercholesterolemia    Reflux       Subjective:    Patient ID: [name], female    DOB: December 08, 1955, 67 y.o.   MRN: [id number]  Chief Complaint  Patient presents with   Follow-up    Chronic osteomyelitis of lumbar spine     HPI:  [name] is a 67 y.o. female here for f/u hx 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  She had associated bacteremia proteus mirabilis blood stream infection 02/03/2022, in setting mri imaging lumbar surgical site abscess/cellulitis. S/p I&D with all old hardware removed but new screws placed on 9/15  04/07/22 id clinic visit She had finished iv ceftriaxone of 8 weeks by 04/02/2022 and transitioned to bactrim ss bid; the cefazolin mic was 8  She walks with fww. She is sore in lower back bilaterally when moving but nothing in the middle. This is chronic. 7 at worst; 2/10 resting   No fever, chill, diarrhea   05/05/22 id clinic visit She had chart hx of sulfa allergy although she doesn't recall any bactrim use or other sulfa product use in the past She tolerated bactrim ss bid fine without n/v/diarrhea/rash Back pain is stable No f/c  She complains of 3-4 weeks right anterior thigh numbness/tingling. She wears tight clothing now  and then but not consistently.   She has right lower ext weakness after back surgery but that is getting better. Lower back pain 3/10 not bad. Bilateral lower back sore/stiff after pt/ot session though. Overall since back surgery 01/2022 leg weakness/back pain better. Walks with walker still  Also complains of chronic tongue burning with things like toothpaste, spicy food. Sensation transient. Doesn't disturb her in other ways. This was heard while she was on ceftriaxone as well.      10/26/22 id clinic f/u Crp has been high still. She continues on bactrim ds 1 tab bid She still have significant nausea but controlled with zofran Back pain 2/10 minimal like before We spoke about trial off medication abx 07/2022 but given crp will keep going, until we get 2 normal over 6 months span before considering it again    Allergies  Allergen Reactions   Crestor [Rosuvastatin] Other (See Comments)    Myalgia    Robaxin [Methocarbamol] Other (See Comments)    Insomnia   Toradol [Ketorolac Tromethamine] Other (See Comments)   Zocor [Simvastatin] Other (See Comments)    Myalgias    Lipitor [Atorvastatin] Other (See Comments)    Myalgia   Sulfa Antibiotics Hives      Outpatient Medications Prior to Visit  Medication Sig Dispense Refill   acetaminophen (TYLENOL) 500 MG tablet Take 1,000 mg by mouth at bedtime as needed for mild pain or headache.     albuterol (PROAIR HFA) 108 (90 BASE)  MCG/ACT inhaler Inhale 2 puffs into the lungs every 6 (six) hours as needed. 1 Inhaler 12   amLODipine (NORVASC) 10 MG tablet Take 1 tablet (10 mg total) by mouth daily. 90 tablet 1   celecoxib (CELEBREX) 200 MG capsule Take 200 mg by mouth 2 (two) times daily.   1   cetirizine (ZYRTEC) 10 MG tablet Take 1 tablet (10 mg total) by mouth daily. 30 tablet 0   docusate sodium (COLACE) 100 MG capsule Take 100 mg by mouth 2 (two) times daily.     fluticasone (FLONASE) 50 MCG/ACT nasal spray Place 2 sprays into both  nostrils daily. 16 g 0   gabapentin (NEURONTIN) 600 MG tablet Take 600 mg by mouth 3 (three) times daily.     iron polysaccharides (NIFEREX) 150 MG capsule TAKE 1 CAPSULE (150 MG DOSE) BY MOUTH TWICE A DAY (Patient taking differently: Take 150 mg by mouth daily.) 180 capsule 1   metFORMIN (GLUCOPHAGE) 1000 MG tablet Take 1 tablet (1,000 mg total) by mouth 2 (two) times daily with a meal. 180 tablet 1   metoprolol succinate (TOPROL-XL) 100 MG 24 hr tablet Take 1 tablet (100 mg total) by mouth daily. (Patient taking differently: Take 100 mg by mouth at bedtime.) 90 tablet 1   ondansetron (ZOFRAN) 8 MG tablet Take 1 tablet (8 mg total) by mouth every 8 (eight) hours as needed for nausea or vomiting. 20 tablet 3   pantoprazole (PROTONIX) 40 MG tablet Take 40 mg by mouth daily.     Semaglutide, 1 MG/DOSE, (OZEMPIC, 1 MG/DOSE,) 4 MG/3ML SOPN Inject 1 mg into the skin once a week. 3 mL 2   sulfamethoxazole-trimethoprim (BACTRIM) 400-80 MG tablet Take 1 tablet by mouth 2 (two) times daily. 60 tablet 11   tiZANidine (ZANAFLEX) 4 MG tablet Take 4 mg by mouth at bedtime as needed for muscle spasms.     valsartan-hydrochlorothiazide (DIOVAN HCT) 160-25 MG tablet Take 1 tablet by mouth daily. 90 tablet 3   No facility-administered medications prior to visit.     Social History   Socioeconomic History   Marital status: Married    Spouse name: Not on file   Number of children: Not on file   Years of education: Not on file   Highest education level: Not on file  Occupational History   Not on file  Tobacco Use   Smoking status: Never   Smokeless tobacco: Never  Vaping Use   Vaping Use: Never used  Substance and Sexual Activity   Alcohol use: No    Alcohol/week: 0.0 standard drinks of alcohol   Drug use: No   Sexual activity: Yes  Other Topics Concern   Not on file  Social History Narrative   Not on file   Social Determinants of Health   Financial Resource Strain: Not on file  Food  Insecurity: No Food Insecurity (02/03/2022)   Hunger Vital Sign    Worried About Running Out of Food in the Last Year: Never true    Ran Out of Food in the Last Year: Never true  Transportation Needs: No Transportation Needs (02/03/2022)   PRAPARE - Administrator, Civil Service (Medical): No    Lack of Transportation (Non-Medical): No  Physical Activity: Not on file  Stress: Not on file  Social Connections: Not on file  Intimate Partner Violence: Not At Risk (02/03/2022)   Humiliation, Afraid, Rape, and Kick questionnaire    Fear of Current or Ex-Partner: No  Emotionally Abused: No    Physically Abused: No    Sexually Abused: No      Review of Systems    All other ros negative Objective:    There were no vitals taken for this visit. Nursing note and vital signs reviewed.  Physical Exam  General/constitutional: no distress, pleasant; walks with cane HEENT: Normocephalic, PER, Conj Clear, EOMI, Oropharynx clear Neck supple CV: rrr no mrg Lungs: clear to auscultation, normal respiratory effort Abd: Soft, Nontender Ext: no edema Skin: No Rash Neuro: nonfocal MSK: no peripheral joint swelling/tenderness/warmth; back spines nontender      Labs: Lab Results  Component Value Date   WBC 7.7 08/03/2022   HGB 10.7 (L) 08/03/2022   HCT 35.1 08/03/2022   MCV 73.1 (L) 08/03/2022   PLT 391 08/03/2022   Last metabolic panel Lab Results  Component Value Date   GLUCOSE 104 (H) 08/03/2022   NA 142 08/03/2022   K 4.5 08/03/2022   CL 107 08/03/2022   CO2 25 08/03/2022   BUN 31 (H) 08/03/2022   CREATININE 1.36 (H) 08/03/2022   GFRNONAA >60 02/08/2022   CALCIUM 10.2 08/03/2022   PROT 7.8 10/12/2022   ALBUMIN 4.7 10/12/2022   LABGLOB 2.9 08/19/2017   AGRATIO 1.7 08/19/2017   BILITOT <0.2 10/12/2022   ALKPHOS 88 10/12/2022   AST 17 10/12/2022   ALT 15 10/12/2022   ANIONGAP 10 02/08/2022   Crp: 08/03/22     26.3   (<8) 04/2022    20.8    (<8)   Micro:  Serology:  Imaging: 03/31/22 mri lumbar spine 1. Postoperative changes since the prior MRI. No findings suspicious for persistent discitis, epidural abscess or paraspinal abscess. 2. New marrow signal abnormality in the L2 and L3 vertebral bodies and subsequent enhancement but no enhancement in the disc space to suggest persistent discitis. This could be remodeling changes related to the recent surgery and new hardware. 3. Some residual prevertebral inflammatory changes but no rim enhancing abscess.  Assessment & Plan:   Problem List Items Addressed This Visit   None Visit Diagnoses     Surgical site infection    -  Primary   Vertebral osteomyelitis (HCC)       Hardware complicating wound infection, subsequent encounter       Relevant Orders   C-reactive protein   CBC   COMPLETE METABOLIC PANEL WITH GFR       No orders of the defined types were placed in this encounter.    #Post-op lumbar infection SP I&D and HW removal with Cx+ Proteus mirabilis  on 9/15; and placement of new screws into infected spine #Psoas abscess #Proteus mirabilis bacteremia  -02/03/22 blood cultures 1/2 Proteus mirabilis.  Initially started on vancomycin, cefepime, metronidazole. - initial MRI L-spine showed epidural abscess extending anteriorly from soft tissue to fusion hardware L3-L4. 1.1x0.8 cm focal area in right psoas muscle suspicious for small abscess - Underwent I&D with hardware removal on 9/15 with neurosurgery Dr. Franky Macho.  Of note all the old hardware was removed new pedicle screws were placed.  ID was engaged and plan for 8 weeks of antibiotics more.  OR cultures grew Proteus mirabilis with cefazolin MIC 8  as such switched  cefazolin to ceftriaxone.   - s/p 8 weeks ceftriaxone by 11/10, switched to bactrim ss bid chronically  05/05/22 id assessment Repeat mri lumbar spine 03/31/22 showed no further abscess Clinically also improving -Meralgia paresthetica and tongue  burning not related to abx -- I  suggest she avoid tight clothing and try capsaicin for the former issue, and also to discuss with pcp/neurology about this; at this age, possibility of b12 deficiency although presenting sx are rather early and prior cbc profile not too suggestive -refill zofran at request for nausea -prn ice pack/tylenol for muscle stiffness post pt/ot -labs today to assess toxicity of bactrim -f/u 3 months  08/03/22 id clinic assessment Doing very well Discuss with her trial off abx Labs today and f/u 6-8 weeks when she'll decide on abx continuation  10/26/22 id clinic assessment Due to still elevated crp without obvious explanation, and despite minimal back pain, will keep going with bactrim for now until we have 2 normal at least 6 months apart She would like more zofran amount in hand for her nausea with bactrim Labs today for toxicity monitoring and crp monitoring Follow up 6 months Continue bactrim 1 ds tablet twice a day    Follow-up: Return in about 6 months (around 04/27/2023).      Raymondo Band, MD [facility] 10/26/2022, 10:01 AM

## 2022-10-27 ENCOUNTER — Telehealth: Payer: Self-pay

## 2022-10-27 DIAGNOSIS — M4620 Osteomyelitis of vertebra, site unspecified: Secondary | ICD-10-CM

## 2022-10-27 LAB — C-REACTIVE PROTEIN: CRP: 18.5 mg/L — ABNORMAL HIGH (ref <8.0)

## 2022-10-27 LAB — COMPLETE METABOLIC PANEL WITH GFR
AST: 15 U/L (ref 10–35)
Albumin: 4.7 g/dL (ref 3.6–5.1)
Alkaline phosphatase (APISO): 76 U/L (ref 37–153)
BUN/Creatinine Ratio: 18 (calc) (ref 6–22)
BUN: 28 mg/dL — ABNORMAL HIGH (ref 7–25)
CO2: 28 mmol/L (ref 20–32)
Calcium: 10.5 mg/dL — ABNORMAL HIGH (ref 8.6–10.4)
Chloride: 104 mmol/L (ref 98–110)
Sodium: 140 mmol/L (ref 135–146)
Total Bilirubin: 0.3 mg/dL (ref 0.2–1.2)
eGFR: 36 mL/min/{1.73_m2} — ABNORMAL LOW (ref >=60)

## 2022-10-27 LAB — CBC
HCT: 32.3 % — ABNORMAL LOW (ref 35.0–45.0)
Hemoglobin: 10.1 g/dL — ABNORMAL LOW (ref 11.7–15.5)
MCH: 23.7 pg — ABNORMAL LOW (ref 27.0–33.0)
MCHC: 31.3 g/dL — ABNORMAL LOW (ref 32.0–36.0)
MCV: 75.8 fL — ABNORMAL LOW (ref 80.0–100.0)
MPV: 10.6 fL (ref 7.5–12.5)
RBC: 4.26 10*6/uL (ref 3.80–5.10)
RDW: 17 % — ABNORMAL HIGH (ref 11.0–15.0)
WBC: 5.4 10*3/uL (ref 3.8–10.8)

## 2022-10-27 NOTE — Telephone Encounter (Signed)
-----   Message from Raymondo Band, MD sent at 10/27/2022 12:07 PM EDT ----- Hi team  I was going to see her several months from now, but let's see her again in 4 weeks rather to recheck kidney function and calcium level again. Its' slowly trending up  Please see if she would be able to do just labs (no need for visit) say cbc/cmp 3-4 weeks from now   Thank you

## 2022-10-27 NOTE — Telephone Encounter (Signed)
Spoke with [place], discussed upward trend in kidney function and calcium. She will come in 7/2 for repeat cbc and cmp.   Sandie Ano, RN

## 2022-10-31 ENCOUNTER — Other Ambulatory Visit: Payer: Self-pay | Admitting: Physician Assistant

## 2022-11-03 ENCOUNTER — Other Ambulatory Visit: Payer: Self-pay | Admitting: *Deleted

## 2022-11-03 DIAGNOSIS — E782 Mixed hyperlipidemia: Secondary | ICD-10-CM

## 2022-11-04 ENCOUNTER — Other Ambulatory Visit (HOSPITAL_COMMUNITY): Payer: Self-pay

## 2022-11-08 ENCOUNTER — Ambulatory Visit: Payer: Medicare HMO | Attending: Physician Assistant

## 2022-11-08 DIAGNOSIS — E782 Mixed hyperlipidemia: Secondary | ICD-10-CM

## 2022-11-09 LAB — LIPOPROTEIN A (LPA): Lipoprotein (a): 128.3 nmol/L — ABNORMAL HIGH (ref <75.0)

## 2022-11-23 ENCOUNTER — Other Ambulatory Visit: Payer: Medicare HMO

## 2022-11-23 ENCOUNTER — Other Ambulatory Visit: Payer: Self-pay

## 2022-11-23 DIAGNOSIS — M4620 Osteomyelitis of vertebra, site unspecified: Secondary | ICD-10-CM

## 2022-11-24 LAB — CBC WITH DIFFERENTIAL/PLATELET
Absolute Monocytes: 553 cells/uL (ref 200–950)
Basophils Absolute: 52 cells/uL (ref 0–200)
Basophils Relative: 0.8 %
Eosinophils Absolute: 150 cells/uL (ref 15–500)
Eosinophils Relative: 2.3 %
HCT: 33.3 % — ABNORMAL LOW (ref 35.0–45.0)
Hemoglobin: 10.3 g/dL — ABNORMAL LOW (ref 11.7–15.5)
Lymphs Abs: 2178 cells/uL (ref 850–3900)
MCH: 23.4 pg — ABNORMAL LOW (ref 27.0–33.0)
MCHC: 30.9 g/dL — ABNORMAL LOW (ref 32.0–36.0)
MCV: 75.7 fL — ABNORMAL LOW (ref 80.0–100.0)
MPV: 11 fL (ref 7.5–12.5)
Monocytes Relative: 8.5 %
Neutro Abs: 3569 cells/uL (ref 1500–7800)
Neutrophils Relative %: 54.9 %
Platelets: 406 10*3/uL — ABNORMAL HIGH (ref 140–400)
RBC: 4.4 10*6/uL (ref 3.80–5.10)
RDW: 16.3 % — ABNORMAL HIGH (ref 11.0–15.0)
Total Lymphocyte: 33.5 %
WBC: 6.5 10*3/uL (ref 3.8–10.8)

## 2022-11-24 LAB — COMPLETE METABOLIC PANEL WITH GFR
AG Ratio: 1.7 (calc) (ref 1.0–2.5)
ALT: 12 U/L (ref 6–29)
AST: 13 U/L (ref 10–35)
Albumin: 4.7 g/dL (ref 3.6–5.1)
Alkaline phosphatase (APISO): 78 U/L (ref 37–153)
BUN/Creatinine Ratio: 17 (calc) (ref 6–22)
BUN: 25 mg/dL (ref 7–25)
CO2: 27 mmol/L (ref 20–32)
Calcium: 10.5 mg/dL — ABNORMAL HIGH (ref 8.6–10.4)
Chloride: 100 mmol/L (ref 98–110)
Creat: 1.45 mg/dL — ABNORMAL HIGH (ref 0.50–1.05)
Globulin: 2.8 g/dL (calc) (ref 1.9–3.7)
Glucose, Bld: 86 mg/dL (ref 65–99)
Potassium: 4.6 mmol/L (ref 3.5–5.3)
Sodium: 139 mmol/L (ref 135–146)
Total Bilirubin: 0.3 mg/dL (ref 0.2–1.2)
Total Protein: 7.5 g/dL (ref 6.1–8.1)
eGFR: 40 mL/min/{1.73_m2} — ABNORMAL LOW (ref >=60)

## 2022-12-03 ENCOUNTER — Other Ambulatory Visit (HOSPITAL_COMMUNITY): Payer: Self-pay

## 2022-12-06 ENCOUNTER — Ambulatory Visit: Payer: Medicare HMO | Attending: Cardiovascular Disease | Admitting: Pharmacist

## 2022-12-06 VITALS — BP 138/82 | HR 87

## 2022-12-06 DIAGNOSIS — I10 Essential (primary) hypertension: Secondary | ICD-10-CM | POA: Diagnosis not present

## 2022-12-06 DIAGNOSIS — E7800 Pure hypercholesterolemia, unspecified: Secondary | ICD-10-CM | POA: Diagnosis not present

## 2022-12-06 MED ORDER — CARVEDILOL 12.5 MG PO TABS: 12.5000 mg | ORAL_TABLET | Freq: Two times a day (BID) | ORAL | 0 refills | Status: DC

## 2022-12-06 MED ORDER — ROSUVASTATIN CALCIUM 5 MG PO TABS: 5.0000 mg | ORAL_TABLET | ORAL | 0 refills | Status: DC

## 2022-12-06 NOTE — Progress Notes (Signed)
Patient ID: [name]                 DOB: [birth year]                    MRN: [id number]      HPI: [name] is a 67 y.o. female patient referred to lipid clinic by Olivia Favre, PA. PMH is significant for moderate aortic stenosis, anxiety, asthma, obesity, diabetes mellitus type 2, GERD, hypertension, hyperlipidemia. Last LDL-C resulted at 193 LP(a) 128.  Patient was referred to lipid clinic to discuss and she has tried 3 statins previous.  Patient presents today to Pharm.D. clinic.  At last visit with [facility] her blood pressure was elevated and she was changed from Hyzaar to valsartan HCTZ.  Patient reports that the valsartan made her very sleepy and she stopped it after several days and went back to losartan.  She does have a blood pressure cuff at home but states that she does not check.  Patient has previously tried atorvastatin 10 mg, simvastatin 40 mg and rosuvastatin 10 mg.  These were stopped due to leg cramps.  She takes magnesium which she says helps her leg cramps and she has not had any cramps since as long she takes her magnesium.  Has not been on anything for her cholesterol in over a year.  Patient is on Medicare and is just that the coverage gap.  She is on Ozempic and paid $190 for her last refill.  Concerned about how she is going to pay for it for the remainder of the year.  She has [employer] Medicare advantage.  She goes to the gym 3 times a week at the senior center and rides the recumbent elliptical like machine and does some free weights.  Current Medications: None Intolerances: atorvastatin 10mg , simvastatin 40mg , rosuvastatin 10mg  (leg cramps) Risk Factors: DM, HTN, LDL-C >190 LDL-C goal: <70, consider less than 55 due to LP(a) greater than 125  Diet:  On ozempic- eats less than before Breakfast: cereal- cherrios w/ banana Lunch: doesn't eat Dinner: chicken, hamburger, hot dogs Snack: not since increasing to 1mg  of ozempic Drink: water w/ sugar free  packets, diet soda  Exercise: senior center- sit down elipical 30 min x 3 times per week, some weights  Family History:  Family History  Problem Relation Age of Onset   Hypertension Mother    Stroke Mother    Heart disease Father    Stroke Brother    Multiple sclerosis Brother    Multiple sclerosis Sister    Colon cancer Neg Hx    Colon polyps Neg Hx    Esophageal cancer Neg Hx    Rectal cancer Neg Hx    Stomach cancer Neg Hx      Social History: no tobacco, no ETOH  Labs: Lipid Panel     Component Value Date/Time   CHOL 263 (H) 10/12/2022 1235   TRIG 131 10/12/2022 1235   HDL 46 10/12/2022 1235   CHOLHDL 5.7 (H) 10/12/2022 1235   CHOLHDL 3.2 11/12/2015 1436   VLDL 28 11/12/2015 1436   LDLCALC 193 (H) 10/12/2022 1235   LABVLDL 24 10/12/2022 1235    Past Medical History:  Diagnosis Date   Allergy    Anemia    Anxiety    Aortic stenosis    moderate AS by echo 12/2021   Arthritis    Asthma    BMI 40.0-44.9, adult (HCC) 04/07/2014   DM type 2 (diabetes  mellitus, type 2) (HCC)    Dyspnea    GERD (gastroesophageal reflux disease)    Heart murmur    HTN (hypertension)    Hypercholesterolemia    Osteoarthritis of left hip 04/07/2014   Reflux    Spinal headache    with C-Section and with spinal fusion in 2019    Current Outpatient Medications on File Prior to Visit  Medication Sig Dispense Refill   losartan-hydrochlorothiazide (HYZAAR) 100-25 MG tablet Take 1 tablet by mouth daily.     acetaminophen (TYLENOL) 500 MG tablet Take 1,000 mg by mouth at bedtime as needed for mild pain or headache.     albuterol (PROAIR HFA) 108 (90 BASE) MCG/ACT inhaler Inhale 2 puffs into the lungs every 6 (six) hours as needed. 1 Inhaler 12   amLODipine (NORVASC) 10 MG tablet Take 1 tablet (10 mg total) by mouth daily. 90 tablet 1   celecoxib (CELEBREX) 200 MG capsule Take 200 mg by mouth 2 (two) times daily.   1   cetirizine (ZYRTEC) 10 MG tablet Take 1 tablet (10 mg total) by  mouth daily. 30 tablet 0   docusate sodium (COLACE) 100 MG capsule Take 100 mg by mouth 2 (two) times daily.     fluticasone (FLONASE) 50 MCG/ACT nasal spray Place 2 sprays into both nostrils daily. 16 g 0   gabapentin (NEURONTIN) 600 MG tablet Take 600 mg by mouth 3 (three) times daily.     iron polysaccharides (NIFEREX) 150 MG capsule TAKE 1 CAPSULE (150 MG DOSE) BY MOUTH TWICE A DAY (Patient taking differently: Take 150 mg by mouth daily.) 180 capsule 1   metFORMIN (GLUCOPHAGE) 1000 MG tablet Take 1 tablet (1,000 mg total) by mouth 2 (two) times daily with a meal. 180 tablet 1   ondansetron (ZOFRAN) 8 MG tablet Take 1 tablet (8 mg total) by mouth every 8 (eight) hours as needed for nausea or vomiting. 60 tablet 5   pantoprazole (PROTONIX) 40 MG tablet Take 40 mg by mouth daily.     Semaglutide, 1 MG/DOSE, (OZEMPIC, 1 MG/DOSE,) 4 MG/3ML SOPN Inject 1 mg into the skin once a week. 3 mL 2   sulfamethoxazole-trimethoprim (BACTRIM) 400-80 MG tablet Take 1 tablet by mouth 2 (two) times daily. 60 tablet 11   tiZANidine (ZANAFLEX) 4 MG tablet Take 4 mg by mouth at bedtime as needed for muscle spasms.     No current facility-administered medications on file prior to visit.    Allergies  Allergen Reactions   Crestor [Rosuvastatin] Other (See Comments)    Myalgia    Robaxin [Methocarbamol] Other (See Comments)    Insomnia   Toradol [Ketorolac Tromethamine] Other (See Comments)   Zocor [Simvastatin] Other (See Comments)    Myalgias    Lipitor [Atorvastatin] Other (See Comments)    Myalgia   Sulfa Antibiotics Hives    Assessment/Plan:  1. Hyperlipidemia -  HTN (hypertension) Assessment: Blood pressure is above goal in clinic Patient does not check her blood pressure at home but does have a blood pressure cuff Reports her blood pressure being about 150/80 at a few other doctors appointments Reported fatigue with valsartan/HCTZ and has resumed losartan/HCTZ with improvement in her  fatigue We discussed options including trying a different ARB, trying chlorthalidone instead of HCTZ or swapping out metoprolol for carvedilol  Plan: Will stop metoprolol and start carvedilol 12.5 mg twice daily.  Will be conservative in the swap over to avoid fatigue I have asked the patient to check her blood  pressure at home and bring her readings and her blood pressure cuff to her next appointment She is currently exercising about 90 minutes/week.  I have encouraged her to increase her aerobic activity to 150 minutes/week Follow-up in clinic in 3 weeks  Hypercholesterolemia Assessment: LDL-C is above goal of less than 70 Could certainly consider a goal less than 55 due to her elevated LP(a) above 125 She has been intolerant to atorvastatin, simvastatin and rosuvastatin with reports of muscle cramps However, patient with muscle cramps not on statins which improves greatly when taking magnesium Currently exercising 3 times a week.  We discussed trying to increase to 4 and eventually 5 days/week Discussed decreasing saturated fat from diet and reviewed common sources of saturated fat Reviewed both statin and nonstatin options (PCSK9, Leqvio, Nexletol, Zetia) with patient Currently no healthwell grant available.  Patient will need multiple medications to get LDL to goal most likely Cost is most certainly a concern for patient  Plan: Patient agreeable to rechallenge with rosuvastatin.  I advised that she could start with rosuvastatin 5 mg every other day however patient wanted to try daily Recheck lipid panel and LFTs in 2 months At that time we will assess her LDL-C and discuss additional therapy is needed.  Hopefully at that time there will be some assistance financially available   Thank you,  Olene Floss, Pharm.D, BCACP, BCPS, CPP [facility] A [facility] of [facility] [address], [place], [place] [address]  Phone: [phone]; Fax: [phone]

## 2022-12-06 NOTE — Patient Instructions (Addendum)
Your blood pressure goal is < 130/22mmHg START taking rosuvastatin 5mg  daily  STOP metoprolol  Please START taking carvedilol 12.5mg  twice a day Start checking blood pressure at home 1-2 times a day. Please bring your machine and readings to your next visit Please call me at [phone] with any questions Fasting labs on 9/12  Important lifestyle changes to control high blood pressure  Intervention  Effect on the BP   Weight loss Weight loss is one of the most effective lifestyle changes for controlling blood pressure. If you're overweight or obese, losing even a small amount of weight can help reduce blood pressure.    Blood pressure can decrease by 1 millimeter of mercury (mmHg) with each kilogram (about 2.2 pounds) of weight lost.   Exercise regularly As a general goal, aim for 30 minutes of moderate physical activity every day.    Regular physical activity can lower blood pressure by 5 - 8 mmHg.   Eat a healthy diet Eat a diet rich in whole grains, fruits, vegetables, lean meat, and low-fat dairy products. Limit processed foods, saturated fat, and sweets.    A heart-healthy diet can lower high blood pressure by 10 mmHg.   Reduce salt (sodium) in your diet Aim for 000mg  of sodium each day. Avoid deli meats, canned food, and frozen microwave meals which are high in sodium.     Limiting sodium can reduce blood pressure by 5 mmHg.   Limit alcohol One drink equals 12 ounces of beer, 5 ounces of wine, or 1.5 ounces of 80-proof liquor.    Limiting alcohol to < 1 drink a day for women or < 2 drinks a day for men can help lower blood pressure by about 4 mmHg.   To check your pressure at home you will need to:   Sit up in a chair, with feet flat on the floor and back supported. Do not cross your ankles or legs. Rest your left arm so that the cuff is about heart level. If the cuff goes on your upper arm, then just relax your arm on the table, arm of the chair, or your lap. If  you have a wrist cuff, hold your wrist against your chest at heart level. Place the cuff snugly around your arm, about 1 inch above the crease of your elbow. The cords should be inside the groove of your elbow.  Sit quietly, with the cuff in place, for about 5 minutes. Then press the power button to start a reading. Do not talk or move while the reading is taking place.  Record your readings on a sheet of paper. Although most cuffs have a memory, it is often easier to see a pattern developing when the numbers are all in front of you.  You can repeat the reading after 1-3 minutes if it is recommended.   Make sure your bladder is empty and you have not had caffeine or tobacco within the last 30 minutes   Always bring your blood pressure log with you to your appointments. If you have not brought your monitor in to be double checked for accuracy, please bring it to your next appointment.   You can find a list of validated (accurate) blood pressure cuffs at: validatebp.org

## 2022-12-06 NOTE — Assessment & Plan Note (Addendum)
Assessment: LDL-C is above goal of less than 70 Could certainly consider a goal less than 55 due to her elevated LP(a) above 125 She has been intolerant to atorvastatin, simvastatin and rosuvastatin with reports of muscle cramps However, patient with muscle cramps not on statins which improves greatly when taking magnesium Currently exercising 3 times a week.  We discussed trying to increase to 4 and eventually 5 days/week Discussed decreasing saturated fat from diet and reviewed common sources of saturated fat Reviewed both statin and nonstatin options (PCSK9, Leqvio, Nexletol, Zetia) with patient Currently no healthwell grant available.  Patient will need multiple medications to get LDL to goal most likely Cost is most certainly a concern for patient  Plan: Patient agreeable to rechallenge with rosuvastatin.  I advised that she could start with rosuvastatin 5 mg every other day however patient wanted to try daily Recheck lipid panel and LFTs in 2 months At that time we will assess her LDL-C and discuss additional therapy is needed.  Hopefully at that time there will be some assistance financially available

## 2022-12-06 NOTE — Assessment & Plan Note (Signed)
Assessment: Blood pressure is above goal in clinic Patient does not check her blood pressure at home but does have a blood pressure cuff Reports her blood pressure being about 150/80 at a few other doctors appointments Reported fatigue with valsartan/HCTZ and has resumed losartan/HCTZ with improvement in her fatigue We discussed options including trying a different ARB, trying chlorthalidone instead of HCTZ or swapping out metoprolol for carvedilol  Plan: Will stop metoprolol and start carvedilol 12.5 mg twice daily.  Will be conservative in the swap over to avoid fatigue I have asked the patient to check her blood pressure at home and bring her readings and her blood pressure cuff to her next appointment She is currently exercising about 90 minutes/week.  I have encouraged her to increase her aerobic activity to 150 minutes/week Follow-up in clinic in 3 weeks

## 2022-12-17 ENCOUNTER — Other Ambulatory Visit: Payer: Self-pay | Admitting: Internal Medicine

## 2022-12-17 ENCOUNTER — Other Ambulatory Visit (HOSPITAL_COMMUNITY): Payer: Self-pay

## 2022-12-17 DIAGNOSIS — R110 Nausea: Secondary | ICD-10-CM

## 2022-12-17 MED ORDER — OZEMPIC (1 MG/DOSE) 4 MG/3ML ~~LOC~~ SOPN
1.0000 mg | PEN_INJECTOR | SUBCUTANEOUS | 2 refills | Status: DC
  Filled 2022-12-17: qty 3, 28d supply, fill #0

## 2022-12-28 ENCOUNTER — Ambulatory Visit: Payer: Medicare HMO

## 2023-01-05 ENCOUNTER — Ambulatory Visit: Payer: Medicare HMO | Admitting: Cardiovascular Disease

## 2023-01-06 ENCOUNTER — Other Ambulatory Visit (HOSPITAL_COMMUNITY): Payer: Self-pay

## 2023-01-06 ENCOUNTER — Other Ambulatory Visit: Payer: Self-pay | Admitting: Internal Medicine

## 2023-01-06 MED ORDER — MOUNJARO 5 MG/0.5ML ~~LOC~~ SOAJ
5.0000 mg | SUBCUTANEOUS | 3 refills | Status: DC
Start: 2023-01-06 — End: 2023-01-11
  Filled 2023-01-06: qty 2, 28d supply, fill #0

## 2023-01-06 NOTE — Telephone Encounter (Signed)
Patient has refills on file at [facility].   Sandie Ano, RN

## 2023-01-10 ENCOUNTER — Other Ambulatory Visit (HOSPITAL_COMMUNITY): Payer: Self-pay

## 2023-01-11 ENCOUNTER — Other Ambulatory Visit: Payer: Self-pay | Admitting: Physician Assistant

## 2023-01-11 NOTE — Telephone Encounter (Signed)
Patient of Dr. Nishan. Please review for refill. Thank you!  

## 2023-01-12 NOTE — Progress Notes (Signed)
Patient ID: [name]                 DOB: [birth year]                    MRN: [id number]      HPI: [name] is a 67 y.o. female patient referred to lipid/HTN clinic by Olivia Favre, PA. PMH is significant for moderate aortic stenosis, anxiety, asthma, obesity, diabetes mellitus type 2, GERD, hypertension, hyperlipidemia. Last LDL-C resulted at 193 LP(a) 128. Marland Kitchen  At last visit with [name] on 10/12/22 her blood pressure was elevated and she was changed from Hyzaar to valsartan HCTZ. At last visit with Pharm.D. on 12/06/22 BP was elevated 138/82. She was not checking BP at home although does have a BP cuff. Reported fatigue with valsartan/hydrochlorothiazide so she had gone back to taking losartan/hydrochlorothiazide. Amlodipine 10 mg daily and losartan/hydrochlorothiazide 100-25 mg were continued. Metoprolol succinate 100 mg was switched to carvedilol 12.5 mg BID. She was counseled to start checking BP at home and bring log to next visit. Conservative on dosing when switching to avoid fatigue.  Patient was also seen for lipid management. LDL in May was 193, above goal <70. She has a hx of intolerances to atorvastatin 10 mg, simvastatin 40 mg and rosuvastatin 10 mg due to muscle cramps. Had not been on any cholesterol medications in over a year. Reported taking magnesium for muscle cramps. Does not get muscle cramps as long as she takes the magnesium. Cost was a major concern for medications as she was in the Medicare coverage gap. Had paid $190 for last Ozempic refill and was unsure how she would be able to afford it for the rest of the year. Restarted rosuvastatin at a reduced dose of 5 mg daily to see if she is able to tolerate. Recommended trying every other day dosing, but pt preferred to try daily.  Today ***  Current Cholesterol Medications: rosuvastatin 5 mg daily Intolerances: atorvastatin 10mg , simvastatin 40mg , rosuvastatin 10mg  (leg cramps) Risk Factors: DM, HTN, LDL-C >190,  elevated Lp(a) LDL-C goal: <70, consider less than 55 due to LP(a) greater than 125  Current HTN Medications: carvedilol 12.5 mg BID, amlodipine 10 mg daily, losartan/hydrochlorothiazide 100-25 mg daily Intolerances: valsartan/hydrochlorothiazide (fatigue) BP Goal: <130/80  Diet:  On ozempic- eats less than before Breakfast: cereal- cherrios w/ banana Lunch: doesn't eat Dinner: chicken, hamburger, hot dogs Snack: not since increasing to 1mg  of ozempic Drink: water w/ sugar free packets, diet soda  Ex[facility]: senior center- sit down elipical 30 min x 3 times per week, some weights  Family History:  Family History  Problem Relation Age of Onset   Hypertension Mother    Stroke Mother    Heart disease Father    Stroke Brother    Multiple sclerosis Brother    Multiple sclerosis Sister    Colon cancer Neg Hx    Colon polyps Neg Hx    Esophageal cancer Neg Hx    Rectal cancer Neg Hx    Stomach cancer Neg Hx      Social History: no tobacco, no ETOH  Labs: Lipid Panel     Component Value Date/Time   CHOL 263 (H) 10/12/2022 1235   TRIG 131 10/12/2022 1235   HDL 46 10/12/2022 1235   CHOLHDL 5.7 (H) 10/12/2022 1235   CHOLHDL 3.2 11/12/2015 1436   VLDL 28 11/12/2015 1436   LDLCALC 193 (H) 10/12/2022 1235   LABVLDL 24 10/12/2022 1235  Past Medical History:  Diagnosis Date   Allergy    Anemia    Anxiety    Aortic stenosis    moderate AS by echo 12/2021   Arthritis    Asthma    BMI 40.0-44.9, adult (HCC) 04/07/2014   DM type 2 (diabetes mellitus, type 2) (HCC)    Dyspnea    GERD (gastroesophageal reflux disease)    Heart murmur    HTN (hypertension)    Hypercholesterolemia    Osteoarthritis of left hip 04/07/2014   Reflux    Spinal headache    with C-Section and with spinal fusion in 2019    Current Outpatient Medications on File Prior to Visit  Medication Sig Dispense Refill   acetaminophen (TYLENOL) 500 MG tablet Take 1,000 mg by mouth at bedtime as needed  for mild pain or headache.     albuterol (PROAIR HFA) 108 (90 BASE) MCG/ACT inhaler Inhale 2 puffs into the lungs every 6 (six) hours as needed. 1 Inhaler 12   amLODipine (NORVASC) 10 MG tablet Take 1 tablet (10 mg total) by mouth daily. 90 tablet 1   carvedilol (COREG) 12.5 MG tablet Take 1 tablet (12.5 mg total) by mouth in the morning and at bedtime. 180 tablet 3   celecoxib (CELEBREX) 200 MG capsule Take 200 mg by mouth 2 (two) times daily.   1   cetirizine (ZYRTEC) 10 MG tablet Take 1 tablet (10 mg total) by mouth daily. 30 tablet 0   docusate sodium (COLACE) 100 MG capsule Take 100 mg by mouth 2 (two) times daily.     fluticasone (FLONASE) 50 MCG/ACT nasal spray Place 2 sprays into both nostrils daily. 16 g 0   gabapentin (NEURONTIN) 600 MG tablet Take 600 mg by mouth 3 (three) times daily.     iron polysaccharides (NIFEREX) 150 MG capsule TAKE 1 CAPSULE (150 MG DOSE) BY MOUTH TWICE A DAY (Patient taking differently: Take 150 mg by mouth daily.) 180 capsule 1   losartan-hydrochlorothiazide (HYZAAR) 100-25 MG tablet Take 1 tablet by mouth daily.     metFORMIN (GLUCOPHAGE) 1000 MG tablet Take 1 tablet (1,000 mg total) by mouth 2 (two) times daily with a meal. 180 tablet 1   ondansetron (ZOFRAN) 8 MG tablet Take 1 tablet (8 mg total) by mouth every 8 (eight) hours as needed for nausea or vomiting. 60 tablet 5   pantoprazole (PROTONIX) 40 MG tablet Take 40 mg by mouth daily.     rosuvastatin (CRESTOR) 5 MG tablet Take 1 tablet (5 mg total) by mouth every other day. 30 tablet 0   sulfamethoxazole-trimethoprim (BACTRIM) 400-80 MG tablet Take 1 tablet by mouth 2 (two) times daily. 60 tablet 11   tiZANidine (ZANAFLEX) 4 MG tablet Take 4 mg by mouth at bedtime as needed for muscle spasms.     No current facility-administered medications on file prior to visit.    Allergies  Allergen Reactions   Crestor [Rosuvastatin] Other (See Comments)    Myalgia    Robaxin [Methocarbamol] Other (See  Comments)    Insomnia   Toradol [Ketorolac Tromethamine] Other (See Comments)   Zocor [Simvastatin] Other (See Comments)    Myalgias    Lipitor [Atorvastatin] Other (See Comments)    Myalgia   Sulfa Antibiotics Hives    Assessment/Plan:  1. Hyperlipidemia -  Assessment: LDL-C is above goal of less than 70 Could certainly consider a goal less than 55 due to her elevated LP(a) above 125 She has been intolerant to atorvastatin,  simvastatin and rosuvastatin with reports of muscle cramps However, patient with muscle cramps not on statins which improves greatly when taking magnesium Currently exercising 3 times a week.  We discussed trying to increase to 4 and eventually 5 days/week Discussed decreasing saturated fat from diet and reviewed common sources of saturated fat Reviewed both statin and nonstatin options (PCSK9, Leqvio, Nexletol, Zetia) with patient Currently no [employer] grant available.  Patient will need multiple medications to get LDL to goal most likely Cost is most certainly a concern for patient   Plan: Patient agreeable to rechallenge with rosuvastatin.  I advised that she could start with rosuvastatin 5 mg every other day however patient wanted to try daily Recheck lipid panel and LFTs in 2 months At that time we will assess her LDL-C and discuss additional therapy is needed.  Hopefully at that time there will be some assistance financially available No problem-specific Assessment & Plan notes found for this encounter.  HTN Assessment: Blood pressure is above goal in clinic Patient does not check her blood pressure at home but does have a blood pressure cuff Reports her blood pressure being about 150/80 at a few other doctors appointments Reported fatigue with valsartan/HCTZ and has resumed losartan/HCTZ with improvement in her fatigue We discussed options including trying a different ARB, trying chlorthalidone instead of HCTZ or swapping out metoprolol for  carvedilol   Plan: Will stop metoprolol and start carvedilol 12.5 mg twice daily.  Will be conservative in the swap over to avoid fatigue I have asked the patient to check her blood pressure at home and bring her readings and her blood pressure cuff to her next appointment She is currently exercising about 90 minutes/week.  I have encouraged her to increase her aerobic activity to 150 minutes/week Follow-up in clinic in 3 weeks   Melissa D Maccia, Pharm.D, BCACP, BCPS, CPP [facility] A Division of [facility] [address] [place], [place] [address]  Phone: [phone]; Fax: [phone]

## 2023-01-13 ENCOUNTER — Other Ambulatory Visit (HOSPITAL_COMMUNITY): Payer: Self-pay

## 2023-01-13 ENCOUNTER — Ambulatory Visit: Payer: Medicare HMO | Attending: Cardiovascular Disease | Admitting: Pharmacist

## 2023-01-13 VITALS — BP 140/70 | HR 90

## 2023-01-13 DIAGNOSIS — E7800 Pure hypercholesterolemia, unspecified: Secondary | ICD-10-CM | POA: Diagnosis not present

## 2023-01-13 DIAGNOSIS — I10 Essential (primary) hypertension: Secondary | ICD-10-CM

## 2023-01-13 MED ORDER — CARVEDILOL 25 MG PO TABS
25.0000 mg | ORAL_TABLET | Freq: Two times a day (BID) | ORAL | 0 refills | Status: DC
  Filled 2023-01-13: qty 60, 30d supply, fill #0

## 2023-01-13 NOTE — Patient Instructions (Addendum)
Increase carvedilol to 25 mg twice a day.  Continue taking losartan/hydrochlorothiazide 100-25 mg daily and amlodipine 10 mg daily  Continue taking rosuvastatin 5 mg every other day.   Follow up in 3-weeks on Thursday 9/12 at 9:30 AM for a blood pressure check and we will also recheck your cholesterol. Please arrive fasting for labs.  Your blood pressure goal is < 130/4mmHg    Important lifestyle changes to control high blood pressure  Intervention  Effect on the BP   Weight loss Weight loss is one of the most effective lifestyle changes for controlling blood pressure. If you're overweight or obese, losing even a small amount of weight can help reduce blood pressure.    Blood pressure can decrease by 1 millimeter of mercury (mmHg) with each kilogram (about 2.2 pounds) of weight lost.   Exercise regularly As a general goal, aim for 30 minutes of moderate physical activity every day.    Regular physical activity can lower blood pressure by 5 - 8 mmHg.   Eat a healthy diet Eat a diet rich in whole grains, fruits, vegetables, lean meat, and low-fat dairy products. Limit processed foods, saturated fat, and sweets.    A heart-healthy diet can lower high blood pressure by 10 mmHg.   Reduce salt (sodium) in your diet Aim for 000mg  of sodium each day. Avoid deli meats, canned food, and frozen microwave meals which are high in sodium.     Limiting sodium can reduce blood pressure by 5 mmHg.   Limit alcohol One drink equals 12 ounces of beer, 5 ounces of wine, or 1.5 ounces of 80-proof liquor.    Limiting alcohol to < 1 drink a day for women or < 2 drinks a day for men can help lower blood pressure by about 4 mmHg.   To check your pressure at home you will need to:   Sit up in a chair, with feet flat on the floor and back supported. Do not cross your ankles or legs. Rest your left arm so that the cuff is about heart level. If the cuff goes on your upper arm, then just relax  your arm on the table, arm of the chair, or your lap. If you have a wrist cuff, hold your wrist against your chest at heart level. Place the cuff snugly around your arm, about 1 inch above the crease of your elbow. The cords should be inside the groove of your elbow.  Sit quietly, with the cuff in place, for about 5 minutes. Then press the power button to start a reading. Do not talk or move while the reading is taking place.  Record your readings on a sheet of paper. Although most cuffs have a memory, it is often easier to see a pattern developing when the numbers are all in front of you.  You can repeat the reading after 1-3 minutes if it is recommended.   Make sure your bladder is empty and you have not had caffeine or tobacco within the last 30 minutes   Always bring your blood pressure log with you to your appointments. If you have not brought your monitor in to be double checked for accuracy, please bring it to your next appointment.   You can find a list of validated (accurate) blood pressure cuffs at: validatebp.org

## 2023-01-17 ENCOUNTER — Other Ambulatory Visit (HOSPITAL_COMMUNITY): Payer: Self-pay

## 2023-01-25 DIAGNOSIS — M8668 Other chronic osteomyelitis, other site: Secondary | ICD-10-CM | POA: Insufficient documentation

## 2023-01-25 DIAGNOSIS — D509 Iron deficiency anemia, unspecified: Secondary | ICD-10-CM | POA: Insufficient documentation

## 2023-01-25 DIAGNOSIS — N1832 Chronic kidney disease, stage 3b: Secondary | ICD-10-CM | POA: Insufficient documentation

## 2023-01-26 ENCOUNTER — Other Ambulatory Visit: Payer: Self-pay | Admitting: Family Medicine

## 2023-01-26 DIAGNOSIS — Z1231 Encounter for screening mammogram for malignant neoplasm of breast: Secondary | ICD-10-CM

## 2023-02-03 ENCOUNTER — Ambulatory Visit: Payer: Medicare HMO | Admitting: Pharmacist

## 2023-02-03 ENCOUNTER — Ambulatory Visit: Payer: Medicare HMO | Attending: Physician Assistant

## 2023-02-03 VITALS — BP 120/62 | HR 77

## 2023-02-03 DIAGNOSIS — E7800 Pure hypercholesterolemia, unspecified: Secondary | ICD-10-CM

## 2023-02-03 DIAGNOSIS — I10 Essential (primary) hypertension: Secondary | ICD-10-CM

## 2023-02-03 MED ORDER — ROSUVASTATIN CALCIUM 10 MG PO TABS: 10.0000 mg | ORAL_TABLET | ORAL | 3 refills | Status: DC

## 2023-02-03 NOTE — Progress Notes (Signed)
Patient ID: [name]                 DOB: [birth year]                    MRN: [id number]      HPI: [name] is a 67 y.o. female patient referred to lipid/HTN clinic by Jari Favre, PA. PMH is significant for moderate aortic stenosis, anxiety, asthma, obesity, diabetes mellitus type 2, GERD, hypertension, hyperlipidemia. Last LDL-C resulted at 193 LP(a) 128.   At last visit with Tessa on 10/12/22 her blood pressure was elevated and she was changed from Hyzaar to valsartan HCTZ. At last visit with Pharm.D. on 12/06/22 BP was elevated 138/82. She was not checking BP at home although does have a BP cuff. Reported fatigue with valsartan/hydrochlorothiazide so she had gone back to taking losartan/hydrochlorothiazide. Amlodipine 10 mg daily and losartan/hydrochlorothiazide 100-25 mg were continued. Metoprolol succinate 100 mg was switched to carvedilol 12.5 mg BID. She was counseled to start checking BP at home and bring log to next visit. Conservative on dosing when switching to avoid fatigue.   Patient was also seen for lipid management. LDL in May was 193, above goal <70. She has a hx of intolerances to atorvastatin 10 mg, simvastatin 40 mg and rosuvastatin 10 mg due to muscle cramps. Had not been on any cholesterol medications in over a year. Reported taking magnesium for muscle cramps. Does not get muscle cramps as long as she takes the magnesium. Cost was a major concern for medications as she was in the Medicare coverage gap. Had paid $190 for last Ozempic refill and was unsure how she would be able to afford it for the rest of the year. Restarted rosuvastatin at a reduced dose of 5 mg every other day on 12/06/22.  At last visit 8/22 patient was tolerating rosuvastatin 5mg  every other day well. BP was 140/70 in clinic. Carvedilol was increased to 25mg  bid.   Patient presents today for follow-up.  She denies any significant dizziness or lightheadedness.  Had a headache the other day but  took some Tylenol and it went away.  Denies any blurred vision or swelling.  She still reports blood pressures 153/77 at home.  Has only checked 3 times.  She is using her right arm.  Patient reports [facility] did not fill her rosuvastatin but she has been taking the 10 mg tablets she had at home every other day for the past 2 weeks and is doing well.  [facility] not [facility] the senior center to [facility] in about a week because her iron was low and she was tired but she states she is going to get back to going.  Lipid labs drawn today also a CMP as patient's renal function has declined since being on Bactrim.  Current Cholesterol Medications: rosuvastatin 5 mg daily every other day Intolerances: atorvastatin 10mg , simvastatin 40mg , rosuvastatin 10mg  (leg cramps) Risk Factors: DM, HTN, LDL-C >190, elevated Lp(a) LDL-C goal: <70, consider less than 55 due to LP(a) greater than 125  Current HTN Medications: carvedilol 25 mg BID, amlodipine 10 mg daily (PM), losartan/hydrochlorothiazide 100-25 mg daily (AM) Intolerances: valsartan/hydrochlorothiazide (fatigue) BP Goal: <130/80  Diet:  On ozempic- eats less than before Breakfast: cereal- cherrios w/ banana Lunch: doesn't eat Dinner: chicken, hamburger, hot dogs Snack: not since increasing to 1mg  of ozempic Drink: water w/ sugar free packets, diet soda  Exercise: senior center- sit down elipical 30 min x 3 times  per week, some weights  Home BP readings: 153/77   Family History:  Family History  Problem Relation Age of Onset   Hypertension Mother    Stroke Mother    Heart disease Father    Stroke Brother    Multiple sclerosis Brother    Multiple sclerosis Sister    Colon cancer Neg Hx    Colon polyps Neg Hx    Esophageal cancer Neg Hx    Rectal cancer Neg Hx    Stomach cancer Neg Hx     Social History: no tobacco, no ETOH  Labs: Lipid Panel     Component Value Date/Time   CHOL 263 (H) 10/12/2022 1235   TRIG 131 10/12/2022 1235   HDL  46 10/12/2022 1235   CHOLHDL 5.7 (H) 10/12/2022 1235   CHOLHDL 3.2 11/12/2015 1436   VLDL 28 11/12/2015 1436   LDLCALC 193 (H) 10/12/2022 1235   LABVLDL 24 10/12/2022 1235   There were no vitals filed for this visit.    Past Medical History:  Diagnosis Date   Allergy    Anemia    Anxiety    Aortic stenosis    moderate AS by echo 12/2021   Arthritis    Asthma    BMI 40.0-44.9, adult (HCC) 04/07/2014   DM type 2 (diabetes mellitus, type 2) (HCC)    Dyspnea    GERD (gastroesophageal reflux disease)    Heart murmur    HTN (hypertension)    Hypercholesterolemia    Osteoarthritis of left hip 04/07/2014   Reflux    Spinal headache    with C-Section and with spinal fusion in 2019    Current Outpatient Medications on File Prior to Visit  Medication Sig Dispense Refill   acetaminophen (TYLENOL) 500 MG tablet Take 1,000 mg by mouth at bedtime as needed for mild pain or headache.     albuterol (PROAIR HFA) 108 (90 BASE) MCG/ACT inhaler Inhale 2 puffs into the lungs every 6 (six) hours as needed. 1 Inhaler 12   amLODipine (NORVASC) 10 MG tablet Take 1 tablet (10 mg total) by mouth daily. 90 tablet 1   carvedilol (COREG) 25 MG tablet Take 1 tablet (25 mg total) by mouth 2 (two) times daily. 60 tablet 0   celecoxib (CELEBREX) 200 MG capsule Take 200 mg by mouth 2 (two) times daily.   1   cetirizine (ZYRTEC) 10 MG tablet Take 1 tablet (10 mg total) by mouth daily. 30 tablet 0   docusate sodium (COLACE) 100 MG capsule Take 100 mg by mouth 2 (two) times daily.     fluticasone (FLONASE) 50 MCG/ACT nasal spray Place 2 sprays into both nostrils daily. 16 g 0   gabapentin (NEURONTIN) 600 MG tablet Take 600 mg by mouth 3 (three) times daily.     iron polysaccharides (NIFEREX) 150 MG capsule TAKE 1 CAPSULE (150 MG DOSE) BY MOUTH TWICE A DAY (Patient taking differently: Take 150 mg by mouth daily.) 180 capsule 1   losartan-hydrochlorothiazide (HYZAAR) 100-25 MG tablet Take 1 tablet by mouth daily.      metFORMIN (GLUCOPHAGE) 1000 MG tablet Take 1 tablet (1,000 mg total) by mouth 2 (two) times daily with a meal. 180 tablet 1   ondansetron (ZOFRAN) 8 MG tablet Take 1 tablet (8 mg total) by mouth every 8 (eight) hours as needed for nausea or vomiting. 60 tablet 5   pantoprazole (PROTONIX) 40 MG tablet Take 40 mg by mouth daily.     rosuvastatin (CRESTOR) 5  MG tablet Take 1 tablet (5 mg total) by mouth every other day. 30 tablet 0   sulfamethoxazole-trimethoprim (BACTRIM) 400-80 MG tablet Take 1 tablet by mouth 2 (two) times daily. 60 tablet 11   tiZANidine (ZANAFLEX) 4 MG tablet Take 4 mg by mouth at bedtime as needed for muscle spasms.     No current facility-administered medications on file prior to visit.    Allergies  Allergen Reactions   Crestor [Rosuvastatin] Other (See Comments)    Myalgia    Robaxin [Methocarbamol] Other (See Comments)    Insomnia   Toradol [Ketorolac Tromethamine] Other (See Comments)   Zocor [Simvastatin] Other (See Comments)    Myalgias    Lipitor [Atorvastatin] Other (See Comments)    Myalgia   Sulfa Antibiotics Hives    Assessment/Plan:  1. Hyperlipidemia -  Uncontrolled based on LDL 193, above goal <70 mg/dl. Could consider even lower goal of <55 mg/dl due to her elevated Lp(a). Currently taking 10 mg of rosuvastatin every other day.  I sent in a prescription for this Checking labs today Plan to follow after labs result   2. HTN - Assessment:  Blood pressure well-controlled in clinic today Home blood pressures are drastically different than readings taken today in clinic Multiple readings at goal today in clinic Blood pressure from heme-onc was 136/83 Do not know the accuracy of her home blood pressure cuff Patient did not really wish for another repeat visit  Plan: Patient has appointment in October with Dr. Eden Emms I have asked patient to bring in her home blood pressure cuff to be checked at that visit.  I suspect her home cuff is  inaccurate or technique is inaccurate Continue carvedilol 25 mg twice a day, amlodipine 10 mg daily, losartan/hydrochlorothiazide 100 mg / 25 mg daily Follow-up as needed   Olene Floss, Pharm.D, BCACP, BCPS, CPP [facility] A Division of [facility] [address] [place], [place] [address]  Phone: [phone]; Fax: [phone]

## 2023-02-03 NOTE — Patient Instructions (Addendum)
Continue carvediolol 25mg  twice a day, amlodipine 10mg  daily and losartan/hydrochlorothiazide 100/25mg  daily  Please bring your home cuff to your visit with Dr. Eden Emms to make sure its accurate  Please call me at [phone] with any questions

## 2023-02-04 ENCOUNTER — Telehealth: Payer: Self-pay | Admitting: Pharmacist

## 2023-02-04 DIAGNOSIS — E7800 Pure hypercholesterolemia, unspecified: Secondary | ICD-10-CM

## 2023-02-04 LAB — COMPREHENSIVE METABOLIC PANEL
ALT: 12 IU/L (ref 0–32)
AST: 15 IU/L (ref 0–40)
Albumin: 4.5 g/dL (ref 3.9–4.9)
Alkaline Phosphatase: 82 IU/L (ref 44–121)
BUN/Creatinine Ratio: 16 (ref 12–28)
BUN: 23 mg/dL (ref 8–27)
Bilirubin Total: <0.2 mg/dL (ref 0.0–1.2)
CO2: 28 mmol/L (ref 20–29)
Calcium: 10 mg/dL (ref 8.7–10.3)
Chloride: 100 mmol/L (ref 96–106)
Creatinine, Ser: 1.47 mg/dL — ABNORMAL HIGH (ref 0.57–1.00)
Globulin, Total: 2.7 g/dL (ref 1.5–4.5)
Glucose: 92 mg/dL (ref 70–99)
Potassium: 4.4 mmol/L (ref 3.5–5.2)
Sodium: 140 mmol/L (ref 134–144)
Total Protein: 7.2 g/dL (ref 6.0–8.5)
eGFR: 39 mL/min/{1.73_m2} — ABNORMAL LOW (ref >59)

## 2023-02-04 LAB — LIPID PANEL
Chol/HDL Ratio: 3.6 ratio (ref 0.0–4.4)
Cholesterol, Total: 153 mg/dL (ref 100–199)
HDL: 43 mg/dL (ref >39)
LDL Chol Calc (NIH): 86 mg/dL (ref 0–99)
Triglycerides: 135 mg/dL (ref 0–149)
VLDL Cholesterol Cal: 24 mg/dL (ref 5–40)

## 2023-02-04 NOTE — Telephone Encounter (Signed)
Spoke with patient about her cholesterol labs.  LDL-C is 83 and above goal of less than 70 (could consider less than 55 due to elevated LP(a)) but much improvement from baseline.  She is tolerating rosuvastatin 10 mg every other day.  Will add ezetimibe 10 mg daily.  Recheck labs in 10 weeks. Kidney function is stable.

## 2023-02-24 ENCOUNTER — Ambulatory Visit
Admission: RE | Admit: 2023-02-24 | Discharge: 2023-02-24 | Disposition: A | Discharge status: Home / Self Care | Payer: Medicare HMO | Source: Ambulatory Visit | Attending: Family Medicine | Admitting: Family Medicine

## 2023-02-24 DIAGNOSIS — Z1231 Encounter for screening mammogram for malignant neoplasm of breast: Secondary | ICD-10-CM

## 2023-03-01 NOTE — Progress Notes (Signed)
Office Visit    Patient Name: [name] Date of Encounter: 03/04/2023  PCP:  [name], MD   [facility]  Cardiologist:  Charlton Haws, MD  Advanced Practice Provider:  No care team member to display Electrophysiologist:  None  HPI    [name] is a 67 y.o. female with past medical history significant for moderate aortic stenosis, anxiety, asthma, obesity, diabetes mellitus type 2, GERD, hypertension, hyperlipidemia    On disability since her spinal fusion in 2019.  She had a history of moderate aortic stenosis with TTE done 03/13/2020 with mean gradient 31 mmHg peak 48 mmHg and mild AR.  She had no history of CAD Myoview done 03/26/2019 with no ischemia EF 60%.  She was seen by PA 8/23 for cardiac clearance prior to back surgery She uses a walker. She has a bad slipped disk Do to poor functional capacity she had PET/CT to clear Done 08/04/22 Normal pefusion EF 62% MBFR 2.22 mild 3 vessel calcium Had uneventful back surgery Dr Franky Macho with revision of L204 pedicle screws and fixation   TTE 09/20/22 EF 65-70% mean gradient 31.5 peak 61.5 DVI 0.25 and AVA 1.1 cm2   Reports no shortness of breath nor dyspnea on exertion. Reports no chest pain, pressure, or tightness. No edema, orthopnea, PND. Reports no palpitations.   Updated 6 month echo being done next week She has some exertional dyspnea but this may be from body habitus Has 13/17 yo kids at home No chest pain, syncope  BP up in office repeat manual 140/85 mmHg  Her forearm cuff runs a bit high  Past Medical History    Past Medical History:  Diagnosis Date   Allergy    Anemia    Anxiety    Aortic stenosis    moderate AS by echo 12/2021   Arthritis    Asthma    BMI 40.0-44.9, adult (HCC) 04/07/2014   DM type 2 (diabetes mellitus, type 2) (HCC)    Dyspnea    GERD (gastroesophageal reflux disease)    Heart murmur    HTN (hypertension)    Hypercholesterolemia    Osteoarthritis of  left hip 04/07/2014   Reflux    Spinal headache    with C-Section and with spinal fusion in 2019   Past Surgical History:  Procedure Laterality Date   CESAREAN SECTION     x3   COLONOSCOPY     LAMINECTOMY  03/2021   SPINAL FUSION  2019   TUBAL LIGATION      Allergies  Allergies  Allergen Reactions   Crestor [Rosuvastatin] Other (See Comments)    Myalgia    Robaxin [Methocarbamol] Other (See Comments)    Insomnia   Toradol [Ketorolac Tromethamine] Other (See Comments)   Zocor [Simvastatin] Other (See Comments)    Myalgias    Lipitor [Atorvastatin] Other (See Comments)    Myalgia   Sulfa Antibiotics Hives     EKGs/Labs/Other Studies Reviewed:   The following studies were reviewed today:  Echocardiogram 01/2021  IMPRESSIONS     1. Left ventricular ejection fraction, by estimation, is 70 to 75%. The  left ventricle has hyperdynamic function. The left ventricle has no  regional wall motion abnormalities. There is mild concentric left  ventricular hypertrophy. Left ventricular  diastolic parameters were normal.   2. Right ventricular systolic function is normal. The right ventricular  size is normal.   3. The mitral valve is normal in structure. Trivial mitral  valve  regurgitation. No evidence of mitral stenosis.   4. The aortic valve has an indeterminant number of cusps. There is  moderate calcification of the aortic valve. There is mild thickening of  the aortic valve. Aortic valve regurgitation is mild. Moderate aortic  valve stenosis. Aortic valve area, by VTI  measures 1.14 cm. Aortic valve mean gradient measures 19.7 mmHg. Aortic  valve Vmax measures 3.04 m/s.   5. The inferior vena cava is normal in size with greater than 50%  respiratory variability, suggesting right atrial pressure of 3 mmHg.   6. Intra-atrial shunting cannot be excluded.   Comparison(s): No significant change from prior study. Prior echo moderate  AS; mean gradient slightly higher at  31 mmHg on prior study.   EKG:  EKG is not ordered today.  Recent Labs: 11/23/2022: Hemoglobin 10.3; Platelets 406 02/03/2023: ALT 12; BUN 23; Creatinine, Ser 1.47; Potassium 4.4; Sodium 140  Recent Lipid Panel    Component Value Date/Time   CHOL 153 02/03/2023 0847   TRIG 135 02/03/2023 0847   HDL 43 02/03/2023 0847   CHOLHDL 3.6 02/03/2023 0847   CHOLHDL 3.2 11/12/2015 1436   VLDL 28 11/12/2015 1436   LDLCALC 86 02/03/2023 0847    Home Medications   Current Meds  Medication Sig   acetaminophen (TYLENOL) 500 MG tablet Take 1,000 mg by mouth at bedtime as needed for mild pain or headache.   albuterol (PROAIR HFA) 108 (90 BASE) MCG/ACT inhaler Inhale 2 puffs into the lungs every 6 (six) hours as needed.   amLODipine (NORVASC) 10 MG tablet Take 1 tablet (10 mg total) by mouth daily.   carvedilol (COREG) 25 MG tablet Take 1 tablet (25 mg total) by mouth 2 (two) times daily.   celecoxib (CELEBREX) 200 MG capsule Take 200 mg by mouth 2 (two) times daily.    cetirizine (ZYRTEC) 10 MG tablet Take 1 tablet (10 mg total) by mouth daily.   docusate sodium (COLACE) 100 MG capsule Take 100 mg by mouth 2 (two) times daily.   fluticasone (FLONASE) 50 MCG/ACT nasal spray Place 2 sprays into both nostrils daily.   gabapentin (NEURONTIN) 600 MG tablet Take 600 mg by mouth 3 (three) times daily.   losartan-hydrochlorothiazide (HYZAAR) 100-25 MG tablet Take 1 tablet by mouth daily.   metFORMIN (GLUCOPHAGE) 1000 MG tablet Take 1 tablet (1,000 mg total) by mouth 2 (two) times daily with a meal.   ondansetron (ZOFRAN) 8 MG tablet Take 1 tablet (8 mg total) by mouth every 8 (eight) hours as needed for nausea or vomiting.   pantoprazole (PROTONIX) 40 MG tablet Take 40 mg by mouth daily.   rosuvastatin (CRESTOR) 10 MG tablet Take 1 tablet (10 mg total) by mouth every other day.   sulfamethoxazole-trimethoprim (BACTRIM) 400-80 MG tablet Take 1 tablet by mouth 2 (two) times daily.   tiZANidine (ZANAFLEX) 4  MG tablet Take 4 mg by mouth at bedtime as needed for muscle spasms.     Review of Systems      All other systems reviewed and are otherwise negative except as noted above.  Physical Exam    VS:  BP (!) 161/83 (BP Location: Right Arm, Patient Position: Sitting) Comment: automatic  Pulse 82   Ht 4\' 11"  (1.499 m)   Wt 191 lb (86.6 kg)   SpO2 98%   BMI 38.58 kg/m  , BMI Body mass index is 38.58 kg/m.  Wt Readings from Last 3 Encounters:  03/04/23 191 lb (86.6 kg)  10/26/22 187 lb (84.8 kg)  10/12/22 186 lb (84.4 kg)     Affect appropriate Chronically ill black female  HEENT: normal Neck supple with no adenopathy JVP normal no bruits no thyromegaly Lungs clear with no wheezing and good diaphragmatic motion Heart:  S1/S2 preserved mid peaking AS murmur, no rub, gallop or click PMI normal Abdomen: benighn, BS positve, no tenderness, no AAA no bruit.  No HSM or HJR Distal pulses intact with no bruits No edema Neuro non-focal Skin warm and dry Prior lumbar surgery x 2    Assessment & Plan   Hypertension -Well-controlled today in the clinic -Continue current medication regimen which includes metoprolol succinate 100 mg daily, and Norvasc 10 mg daily and diovan  Hyperlipidemia -On crestor every other day Zetia added by Pharm D 9/20204 LDL 86 update labs December   Moderate AS -echo reviewed with patient -update TTE  -she is asymptomatic at this time - Discussed likely need for AVR in next 2-3 years at most   Disposition: Follow up 6 months with Charlton Haws, MD or APP.  Lipid / Liver December  TTE for AS   Signed, Charlton Haws, MD 03/04/2023, 2:22 PM [facility]

## 2023-03-04 ENCOUNTER — Encounter: Payer: Self-pay | Admitting: Cardiovascular Disease

## 2023-03-04 ENCOUNTER — Ambulatory Visit: Payer: Medicare HMO | Attending: Cardiovascular Disease | Admitting: Cardiovascular Disease

## 2023-03-04 VITALS — BP 161/83 | HR 82 | Ht 59.0 in | Wt 191.0 lb

## 2023-03-04 DIAGNOSIS — I350 Nonrheumatic aortic (valve) stenosis: Secondary | ICD-10-CM | POA: Diagnosis not present

## 2023-03-04 DIAGNOSIS — I10 Essential (primary) hypertension: Secondary | ICD-10-CM

## 2023-03-04 NOTE — Patient Instructions (Addendum)
Medication Instructions:  Your physician recommends that you continue on your current medications as directed. Please refer to the Current Medication list given to you today.  *If you need a refill on your cardiac medications before your next appointment, please call your pharmacy*  Lab Work: Your physician recommends that you return for lab work on 04/15/23 for fasting lipid and liver panel  If you have labs (blood work) drawn today and your tests are completely normal, you will receive your results only by: MyChart Message (if you have MyChart) OR A paper copy in the mail If you have any lab test that is abnormal or we need to change your treatment, we will call you to review the results.  Testing/Procedures: Your physician has requested that you have an echocardiogram on 03/15/23. Echocardiography is a painless test that uses sound waves to create images of your heart. It provides your doctor with information about the size and shape of your heart and how well your heart's chambers and valves are working. This procedure takes approximately one hour. There are no restrictions for this procedure. Please do NOT wear cologne, perfume, aftershave, or lotions (deodorant is allowed). Please arrive 15 minutes prior to your appointment time.  Follow-Up: At [facility], you and your health needs are our priority.  As part of our continuing mission to provide you with exceptional heart care, we have created designated Provider Care Teams.  These Care Teams include your primary Cardiologist (physician) and Advanced Practice Providers (APPs -  Physician Assistants and Nurse Practitioners) who all work together to provide you with the care you need, when you need it.  We recommend signing up for the patient portal called "MyChart".  Sign up information is provided on this After Visit Summary.  MyChart is used to connect with patients for Virtual Visits (Telemedicine).  Patients are able to view  lab/test results, encounter notes, upcoming appointments, etc.  Non-urgent messages can be sent to your provider as well.   To learn more about what you can do with MyChart, go to [email or web address].    Your next appointment:   6 month(s)  Provider:   Charlton Haws, MD

## 2023-03-15 ENCOUNTER — Ambulatory Visit (HOSPITAL_COMMUNITY): Payer: Medicare HMO | Attending: Internal Medicine

## 2023-03-15 DIAGNOSIS — I350 Nonrheumatic aortic (valve) stenosis: Secondary | ICD-10-CM | POA: Insufficient documentation

## 2023-03-15 LAB — ECHOCARDIOGRAM COMPLETE
AR max vel: 0.4 cm2
AV Area VTI: 0.48 cm2
AV Area mean vel: 0.44 cm2
AV Mean grad: 50 mm[Hg]
AV Peak grad: 89.9 mm[Hg]
Ao pk vel: 4.74 m/s
Area-P 1/2: 3.65 cm2
P 1/2 time: 608 ms
S' Lateral: 2.3 cm

## 2023-03-17 ENCOUNTER — Telehealth: Payer: Self-pay

## 2023-03-17 NOTE — Telephone Encounter (Signed)
Patient is returning call. Please advise? 

## 2023-03-17 NOTE — Telephone Encounter (Signed)
-----   Message from Charlton Haws sent at 03/16/2023  5:17 PM EDT ----- I am gone next 2 weeks she has severe AS now. Would set her up for right and left cath with [facility] or [facility] and decide on TAVR/SAVR. She is young but poor functional capacity would have her see CVTS [facility] or [facility] after cath ----- Message ----- From: Bunnie Domino Sent: 03/16/2023  12:37 PM EDT To: Wendall Stade, MD; [address] Triage  Ms. Kohlmeyer,   You have a hyperdynamic heart.  You have some thickening of your left ventricle.  Moderate leak of your mitral valve.  Severe aortic stenosis and mild leak of your aortic valve.  Your aortic valve area is now measuring 0.48 cm.  This is significantly tighter than your last echocardiogram.  I have sent these results up to Dr. Eden Emms.  We will most likely need to refer you to Dr. Excell Seltzer to discuss possible TAVR procedure for your valve.  If you have questions please feel free to reach out.  Sharlene Dory, PA-C

## 2023-03-17 NOTE — Telephone Encounter (Signed)
Left message for patient to call back. Dr. Lynnette Caffey and Dr. Excell Seltzer have openings for structural consults next week. Will try to get her scheduled with one of them to discuss cath, get EKG, and lab work at that visit.

## 2023-03-22 NOTE — Telephone Encounter (Signed)
Patient is seeing Dr. Excell Seltzer next week.

## 2023-03-29 ENCOUNTER — Ambulatory Visit: Payer: Medicare HMO | Attending: Cardiovascular Disease | Admitting: Cardiovascular Disease

## 2023-03-29 ENCOUNTER — Encounter: Payer: Self-pay | Admitting: Cardiovascular Disease

## 2023-03-29 VITALS — BP 128/84 | HR 75 | Ht 59.0 in | Wt 190.2 lb

## 2023-03-29 DIAGNOSIS — I350 Nonrheumatic aortic (valve) stenosis: Secondary | ICD-10-CM

## 2023-03-29 DIAGNOSIS — Z01810 Encounter for preprocedural cardiovascular examination: Secondary | ICD-10-CM | POA: Diagnosis not present

## 2023-03-29 NOTE — Patient Instructions (Signed)
Lab Work: CBC, BMET today If you have labs (blood work) drawn today and your tests are completely normal, you will receive your results only by: MyChart Message (if you have MyChart) OR A paper copy in the mail If you have any lab test that is abnormal or we need to change your treatment, we will call you to review the results.  Testing/Procedures: L Heart Catheterization Your physician has requested that you have a cardiac catheterization. Cardiac catheterization is used to diagnose and/or treat various heart conditions. Doctors may recommend this procedure for a number of different reasons. The most common reason is to evaluate chest pain. Chest pain can be a symptom of coronary artery disease (CAD), and cardiac catheterization can show whether plaque is narrowing or blocking your heart's arteries. This procedure is also used to evaluate the valves, as well as measure the blood flow and oxygen levels in different parts of your heart. For further information please visit [email or web address] Please follow instruction sheet, as given.  TAVR CT's and Surgical evaluation (you will be called to schedule)  Follow-Up: At [facility], you and your health needs are our priority.  As part of our continuing mission to provide you with exceptional heart care, we have created designated Provider Care Teams.  These Care Teams include your primary Cardiologist (physician) and Advanced Practice Providers (APPs -  Physician Assistants and Nurse Practitioners) who all work together to provide you with the care you need, when you need it.  Your next appointment:   Structural team will follow-up  Provider:   Tonny Bollman, MD     Other Instructions       Cardiac/Peripheral Catheterization   You are scheduled for a Cardiac Catheterization on Wednesday, November 13 with Dr. Tonny Bollman.  1. Please arrive at the [facility] (Main Entrance A) at [facility]: [address]  [place], [place] [address] at 8:00 AM (This time is five hour(s) before your procedure to ensure your preparation). Free valet parking service is available. You will check in at ADMITTING. The support person will be asked to wait in the waiting room.  It is OK to have someone drop you off and come back when you are ready to be discharged.        Special note: Every effort is made to have your procedure done on time. Please understand that emergencies sometimes delay scheduled procedures.  2. Diet: Do not eat solid foods after midnight.  You may have clear liquids until 5 AM the day of the procedure.  3. Labs: TODAY  4. Medication instructions in preparation for your procedure:   Contrast Allergy: No  DO NOT TAKE Losartan/hydrochlorothiazide the day before or day of procedure (last Monday, resume Thursday)  DO NOT TAKE Metformin the day before and HOLD for 2 days after procedure (last Monday, resume Friday)  On the morning of your procedure, take Aspirin 81 mg and any morning medicines NOT listed above.  You may use sips of water.  5. Plan to go home the same day, you will only stay overnight if medically necessary. 6. You MUST have a responsible adult to drive you home. 7. An adult MUST be with you the first 24 hours after you arrive home. 8. Bring a current list of your medications, and the last time and date medication taken. 9. Bring ID and current insurance cards. 10.Please wear clothes that are easy to get on and off and wear slip-on shoes.  Thank you for  allowing [place] to care for you!   -- [facility]

## 2023-03-29 NOTE — Progress Notes (Addendum)
Pre Surgical Assessment: 5 M Walk Test  60M=16.34ft  5 Meter Walk Test- trial 1: 8.62 seconds 5 Meter Walk Test- trial 2: 7.51 seconds 5 Meter Walk Test- trial 3: 7.31 seconds 5 Meter Walk Test Average: 7.81 seconds    _________________________  Procedure Type: Isolated AVR  Perioperative Outcome Estimate % Operative Mortality 2.51% Morbidity & Mortality 7.87% Stroke 0.887% Renal Failure 1.72% Reoperation 2.39% Prolonged Ventilation 5.25% Deep Sternal Wound Infection 0.108% Long Hospital Stay (>14 days) 3.62% Short Hospital Stay (<6 days)* 50.4%

## 2023-03-29 NOTE — Progress Notes (Signed)
Cardiology Office Note:    Date:  03/29/2023   ID:  Lajean, Boese Mar 04, 1956, MRN [id number]  PCP:  [name], MD   [facility] Providers Cardiologist:  Charlton Haws, MD     Referring MD: [name], MD   Chief Complaint  Patient presents with   Shortness of Breath    History of Present Illness:    [name] is a 68 y.o. female referred by Dr Eden Emms for evaluation of severe aortic stenosis.  The patient is here with her husband today.  She reports heart murmur since she was in her teens.  She has developed progressive aortic stenosis with a recent echocardiogram meeting criteria for severe aortic stenosis with a mean transvalvular gradient greater than 40 mmHg and a peak transaortic velocity greater than 4 m/s, calculated aortic valve area 0.4 to 0.5 cm.  The patient is disabled from low back problems.  She has had 4 back surgeries including removal of hardware for an infection and reimplantation of new hardware last August 2023.  She states that she has been on chronic suppressive antibiotics since that time.  She has not had any fevers or chills.  The patient ambulates with a walker.  States that she has had good control of her blood pressure.  She complains of exertional dyspnea and fatigue.  She is short of breath with less than 1 flight of stairs, walking less than 1 city block, or light household activities.  She has some atypical chest discomfort that is sharp and radiates down the left arm but it is fleeting in nature.  She denies orthopnea, PND, or leg swelling.  She admits to lightheadedness.  She sees the dentist regularly, has some partial plates, but denies any current issues with her teeth or gums.   Current Medications: Current Meds  Medication Sig   acetaminophen (TYLENOL) 500 MG tablet Take 1,000 mg by mouth at bedtime as needed for mild pain or headache.   albuterol (PROAIR HFA) 108 (90 BASE) MCG/ACT inhaler Inhale 2 puffs into  the lungs every 6 (six) hours as needed.   amLODipine (NORVASC) 10 MG tablet Take 1 tablet (10 mg total) by mouth daily.   carvedilol (COREG) 25 MG tablet Take 1 tablet (25 mg total) by mouth 2 (two) times daily.   celecoxib (CELEBREX) 200 MG capsule Take 200 mg by mouth 2 (two) times daily.    cetirizine (ZYRTEC) 10 MG tablet Take 1 tablet (10 mg total) by mouth daily.   docusate sodium (COLACE) 100 MG capsule Take 100 mg by mouth 2 (two) times daily.   fluticasone (FLONASE) 50 MCG/ACT nasal spray Place 2 sprays into both nostrils daily.   gabapentin (NEURONTIN) 600 MG tablet Take 600 mg by mouth 3 (three) times daily.   losartan-hydrochlorothiazide (HYZAAR) 100-25 MG tablet Take 1 tablet by mouth daily.   metFORMIN (GLUCOPHAGE) 1000 MG tablet Take 1 tablet (1,000 mg total) by mouth 2 (two) times daily with a meal.   ondansetron (ZOFRAN) 8 MG tablet Take 1 tablet (8 mg total) by mouth every 8 (eight) hours as needed for nausea or vomiting.   pantoprazole (PROTONIX) 40 MG tablet Take 40 mg by mouth daily.   rosuvastatin (CRESTOR) 10 MG tablet Take 1 tablet (10 mg total) by mouth every other day.   sulfamethoxazole-trimethoprim (BACTRIM) 400-80 MG tablet Take 1 tablet by mouth 2 (two) times daily.   tiZANidine (ZANAFLEX) 4 MG tablet Take 4 mg by mouth at  bedtime as needed for muscle spasms.     Allergies:   Crestor [rosuvastatin], Robaxin [methocarbamol], Toradol [ketorolac tromethamine], Zocor [simvastatin], Lipitor [atorvastatin], and Sulfa antibiotics   ROS:   Please see the history of present illness.    All other systems reviewed and are negative.  EKGs/Labs/Other Studies Reviewed:    The following studies were reviewed today: Cardiac Studies & Procedures     STRESS TESTS  NM PET CT CARDIAC PERFUSION MULTI W/ABSOLUTE BLOODFLOW 08/04/2022  Narrative   LV perfusion is normal. There is no evidence of ischemia. There is no evidence of infarction.   Rest left ventricular function is  normal. Rest EF: 62 %. Stress left ventricular function is normal. Stress EF: 63 %. End diastolic cavity size is normal.   Myocardial blood flow was computed to be 1.80ml/g/min at rest and 2.70ml/g/min at stress. Global myocardial blood flow reserve was 2.22 and was normal.   Coronary calcium was present on the attenuation correction CT images. Mild coronary calcifications were present. Coronary calcifications were present in the left anterior descending artery, left circumflex artery and right coronary artery distribution(s).   The study is normal. The study is low risk.  CLINICAL DATA:  This over-read does not include interpretation of cardiac or coronary anatomy or pathology. The Cardiac PET CT interpretation by the cardiologist is attached.  COMPARISON:  Chest radiograph 02/03/2022  FINDINGS: No pleural fluid.  Clear imaged lungs.  Aortic atherosclerosis.  Aortic valve calcification.  No imaged thoracic adenopathy.  Tiny hiatal hernia.  1.2 cm gallstone. Normal imaged portions of the liver, spleen, pancreas, adrenal glands, kidneys.  No acute osseous abnormality.  IMPRESSION: 1.  No acute findings in the imaged extracardiac chest. 2.  Aortic Atherosclerosis (ICD10-I70.0). 3. Aortic valvular calcifications. Consider echocardiography to evaluate for valvular dysfunction. 4.  Tiny hiatal hernia. 5. Cholelithiasis   Electronically Signed By: Jeronimo Greaves M.D. On: 08/04/2022 12:07   ECHOCARDIOGRAM  ECHOCARDIOGRAM COMPLETE 03/15/2023  Narrative ECHOCARDIOGRAM REPORT    Patient Name:   KAYELA HUMPHRES Date of Exam: 03/15/2023 Medical Rec #:  [id number]         Height:       59.0 in Accession #:    [id number]        Weight:       191.0 lb Date of Birth:  [birth year]         BSA:          1.809 m Patient Age:    67 years          BP:           161/83 mmHg Patient Gender: F                 HR:           79 bpm. Exam Location:  [address]  Procedure: 2D Echo, Cardiac  Doppler and Color Doppler  Indications:    I35.0 Aortic Stenosis  History:        Patient has prior history of Echocardiogram examinations, most recent 09/20/2022. Signs/Symptoms:Murmur and Dyspnea; Risk Factors:Hypertension, Diabetes and Dyslipidemia.  Sonographer:    Sedonia Small Rodgers-Jones RDCS Referring Phys: [id number] TESSA N CONTE  IMPRESSIONS   1. Hypertrophy best assessed in the short axis; papillary hypertrophy noted. Left ventricular ejection fraction, by estimation, is 70 to 75%. The left ventricle has hyperdynamic function. The left ventricle has no regional wall motion abnormalities. There is moderate concentric left ventricular hypertrophy. Left ventricular diastolic parameters are indeterminate.  2. Right ventricular systolic function is normal. The right ventricular size is normal. 3. The mitral valve is abnormal. Moderate mitral valve regurgitation. 4. Aortic valve gradients best assessed from right upper sternal border; suboptimal LVOT PW Doppler but severe AS by other criteria. The aortic valve is calcified. Aortic valve regurgitation is mild. Aortic regurgitation PHT measures 608 msec. Aortic valve mean gradient measures 50.0 mmHg. Aortic valve Vmax measures 4.74 m/s.  Comparison(s): Prior images reviewed side by side. Aortic valve gradients are worse (including assessment at the RUSB). MR is new.  FINDINGS Left Ventricle: Hypertrophy best assessed in the short axis; papillary hypertrophy noted. Left ventricular ejection fraction, by estimation, is 70 to 75%. The left ventricle has hyperdynamic function. The left ventricle has no regional wall motion abnormalities. The left ventricular internal cavity size was normal in size. There is moderate concentric left ventricular hypertrophy. Left ventricular diastolic parameters are indeterminate.  Right Ventricle: The right ventricular size is normal. No increase in right ventricular wall thickness. Right ventricular systolic function  is normal.  Left Atrium: Left atrial size was normal in size.  Right Atrium: Right atrial size was normal in size.  Pericardium: There is no evidence of pericardial effusion.  Mitral Valve: The mitral valve is abnormal. Moderate mitral valve regurgitation.  Tricuspid Valve: The tricuspid valve is normal in structure. Tricuspid valve regurgitation is not demonstrated. No evidence of tricuspid stenosis.  Aortic Valve: Aortic valve gradients best assessed from right upper sternal border; suboptimal LVOT PW Doppler but severe AS by other criteria. The aortic valve is calcified. Aortic valve regurgitation is mild. Aortic regurgitation PHT measures 608 msec. Aortic valve mean gradient measures 50.0 mmHg. Aortic valve peak gradient measures 89.9 mmHg. Aortic valve area, by VTI measures 0.48 cm.  Pulmonic Valve: The pulmonic valve was not well visualized. Pulmonic valve regurgitation is not visualized. No evidence of pulmonic stenosis.  Aorta: The aortic root and ascending aorta are structurally normal, with no evidence of dilitation.  IAS/Shunts: The interatrial septum appears to be lipomatous. No atrial level shunt detected by color flow Doppler.   LEFT VENTRICLE PLAX 2D LVIDd:         3.90 cm   Diastology LVIDs:         2.30 cm   LV e' medial:    6.73 cm/s LV PW:         0.90 cm   LV E/e' medial:  17.3 LV IVS:        0.90 cm   LV e' lateral:   7.46 cm/s LVOT diam:     1.50 cm   LV E/e' lateral: 15.6 LV SV:         43 LV SV Index:   24 LVOT Area:     1.77 cm   RIGHT VENTRICLE             IVC RV Basal diam:  3.50 cm     IVC diam: 1.00 cm RV S prime:     13.50 cm/s TAPSE (M-mode): 3.3 cm  LEFT ATRIUM             Index        RIGHT ATRIUM          Index LA diam:        4.80 cm 2.65 cm/m   RA Area:     9.03 cm LA Vol (A2C):   44.2 ml 24.44 ml/m  RA Volume:   17.50 ml 9.68 ml/m LA Vol (A4C):  58.4 ml 32.29 ml/m LA Biplane Vol: 52.4 ml 28.97 ml/m AORTIC VALVE AV Area (Vmax):     0.40 cm AV Area (Vmean):   0.44 cm AV Area (VTI):     0.48 cm AV Vmax:           474.00 cm/s AV Vmean:          295.800 cm/s AV VTI:            0.908 m AV Peak Grad:      89.9 mmHg AV Mean Grad:      50.0 mmHg LVOT Vmax:         106.50 cm/s LVOT Vmean:        72.850 cm/s LVOT VTI:          0.246 m LVOT/AV VTI ratio: 0.27 AI PHT:            608 msec  AORTA Ao Root diam: 2.50 cm Ao Asc diam:  3.10 cm  MITRAL VALVE MV Area (PHT): 3.65 cm     SHUNTS MV Decel Time: 208 msec     Systemic VTI:  0.25 m MV E velocity: 116.50 cm/s  Systemic Diam: 1.50 cm MV A velocity: 117.00 cm/s MV E/A ratio:  1.00  Riley Lam MD Electronically signed by Riley Lam MD Signature Date/Time: 03/15/2023/3:47:21 PM    Final             EKG:   EKG Interpretation Date/Time:  Tuesday March 29 2023 09:29:01 EST Ventricular Rate:  80 PR Interval:  152 QRS Duration:  68 QT Interval:  366 QTC Calculation: 422 R Axis:   58  Text Interpretation: Normal sinus rhythm Nonspecific T wave abnormality When compared with ECG of 04-Mar-2023 14:16, T wave inversion now evident in Inferior leads Nonspecific T wave abnormality, improved in Lateral leads Confirmed by Tonny Bollman [phone]) on 03/29/2023 9:33:04 AM    Recent Labs: 11/23/2022: Hemoglobin 10.3; Platelets 406 02/03/2023: ALT 12; BUN 23; Creatinine, Ser 1.47; Potassium 4.4; Sodium 140  Recent Lipid Panel    Component Value Date/Time   CHOL 153 02/03/2023 0847   TRIG 135 02/03/2023 0847   HDL 43 02/03/2023 0847   CHOLHDL 3.6 02/03/2023 0847   CHOLHDL 3.2 11/12/2015 1436   VLDL 28 11/12/2015 1436   LDLCALC 86 02/03/2023 0847     Risk Assessment/Calculations:                Physical Exam:    VS:  BP 128/84   Pulse 75   Ht 4\' 11"  (1.499 m)   Wt 190 lb 3.2 oz (86.3 kg)   SpO2 97%   BMI 38.42 kg/m     Wt Readings from Last 3 Encounters:  03/29/23 190 lb 3.2 oz (86.3 kg)  03/04/23 191 lb (86.6 kg)   10/26/22 187 lb (84.8 kg)     GEN:  Well nourished, well developed pleasant obese woman in no acute distress HEENT: Normal NECK: No JVD; No carotid bruits LYMPHATICS: No lymphadenopathy CARDIAC: RRR, 3/6 crescendo decrescendo murmur at the right upper sternal border RESPIRATORY:  Clear to auscultation without rales, wheezing or rhonchi  ABDOMEN: Soft, non-tender, non-distended MUSCULOSKELETAL:  No edema; No deformity  SKIN: Warm and dry NEUROLOGIC:  Alert and oriented x 3 PSYCHIATRIC:  Normal affect   Assessment & Plan Nonrheumatic aortic (valve) stenosis The patient has severe, stage D1 aortic stenosis with NYHA functional class II symptoms of exertional dyspnea and fatigue.  I have personally reviewed her echo  images which show moderate concentric LVH and normal LVEF with no regional wall motion abnormalities.  The patient's peak transaortic velocity is 4.7 m/s with peak and mean gradients of 90 and 50 mmHg, respectively.  The dimensionless index is 0.27 and calculated aortic valve area ranges between 0.4 and 0.48 cm.  The aortic valve is moderately calcified and severely restricted on 2D imaging. I have reviewed the natural history of aortic stenosis with the patient and their family members who are present today. We have discussed the limitations of medical therapy and the poor prognosis associated with symptomatic aortic stenosis. We have reviewed potential treatment options, including palliative medical therapy, conventional surgical aortic valve replacement, and transcatheter aortic valve replacement. We discussed treatment options in the context of the patient's specific comorbid medical conditions.  Her primary comorbid conditions include functional disability from low back problems, for prior lumbar surgeries, and history of hardware infection in 2023 on chronic suppressive antibiotics.  Other comorbidities include stage IIIb chronic kidney disease, hypertension, and type 2 diabetes.   The patient would like to proceed with further evaluation and will require cardiac catheterization to evaluate for obstructive CAD. I have reviewed the risks, indications, and alternatives to cardiac catheterization, possible angioplasty, and stenting with the patient. Risks include but are not limited to bleeding, infection, vascular injury, stroke, myocardial infection, arrhythmia, kidney injury, radiation-related injury in the case of prolonged fluoroscopy use, emergency cardiac surgery, and death. The patient understands the risks of serious complication is 1-2 in 1000 with diagnostic cardiac cath and 1-2% or less with angioplasty/stenting.  She will also undergo CT angiography studies of the heart as well as the chest, abdomen, and pelvis to evaluate anatomic suitability for TAVR.  Once her cardiac catheterization and CTA studies are completed, she will be referred for formal cardiac surgical consultation as part of a multidisciplinary heart team approach to her care.  During her evaluation today, I demonstrated a procedural animation of the TAVR surgery, discussed the steps and potential complications, and reviewed some of the potential advantages and disadvantages of TAVR versus surgical AVR.  Pre-procedural cardiovascular examination As outlined above, she will be prehydrated in the context of her chronic kidney disease.       Informed Consent   Shared Decision Making/Informed Consent The risks [stroke (1 in 1000), death (1 in 1000), kidney failure [usually temporary] (1 in 500), bleeding (1 in 200), allergic reaction [possibly serious] (1 in 200)], benefits (diagnostic support and management of coronary artery disease) and alternatives of a cardiac catheterization were discussed in detail with Ms. Boesel and she is willing to proceed.       Medication Adjustments/Labs and Tests Ordered: Current medicines are reviewed at length with the patient today.  Concerns regarding medicines are outlined  above.  Orders Placed This Encounter  Procedures   CBC   Basic metabolic panel   EKG 12-Lead   No orders of the defined types were placed in this encounter.   Patient Instructions  Lab Work: CBC, BMET today If you have labs (blood work) drawn today and your tests are completely normal, you will receive your results only by: MyChart Message (if you have MyChart) OR A paper copy in the mail If you have any lab test that is abnormal or we need to change your treatment, we will call you to review the results.  Testing/Procedures: L Heart Catheterization Your physician has requested that you have a cardiac catheterization. Cardiac catheterization is used to diagnose and/or treat various  heart conditions. Doctors may recommend this procedure for a number of different reasons. The most common reason is to evaluate chest pain. Chest pain can be a symptom of coronary artery disease (CAD), and cardiac catheterization can show whether plaque is narrowing or blocking your heart's arteries. This procedure is also used to evaluate the valves, as well as measure the blood flow and oxygen levels in different parts of your heart. For further information please visit [email or web address] Please follow instruction sheet, as given.  TAVR CT's and Surgical evaluation (you will be called to schedule)  Follow-Up: At [facility], you and your health needs are our priority.  As part of our continuing mission to provide you with exceptional heart care, we have created designated Provider Care Teams.  These Care Teams include your primary Cardiologist (physician) and Advanced Practice Providers (APPs -  Physician Assistants and Nurse Practitioners) who all work together to provide you with the care you need, when you need it.  Your next appointment:   Structural team will follow-up  Provider:   Tonny Bollman, MD     Other Instructions       Cardiac/Peripheral Catheterization   You are  scheduled for a Cardiac Catheterization on Wednesday, November 13 with Dr. Tonny Bollman.  1. Please arrive at the [facility] (Main Entrance A) at [facility]: [address] [place], [place] [address] at 8:00 AM (This time is five hour(s) before your procedure to ensure your preparation). Free valet parking service is available. You will check in at ADMITTING. The support person will be asked to wait in the waiting room.  It is OK to have someone drop you off and come back when you are ready to be discharged.        Special note: Every effort is made to have your procedure done on time. Please understand that emergencies sometimes delay scheduled procedures.  2. Diet: Do not eat solid foods after midnight.  You may have clear liquids until 5 AM the day of the procedure.  3. Labs: TODAY  4. Medication instructions in preparation for your procedure:   Contrast Allergy: No  DO NOT TAKE Losartan/hydrochlorothiazide the day before or day of procedure (last Monday, resume Thursday)  DO NOT TAKE Metformin the day before and HOLD for 2 days after procedure (last Monday, resume Friday)  On the morning of your procedure, take Aspirin 81 mg and any morning medicines NOT listed above.  You may use sips of water.  5. Plan to go home the same day, you will only stay overnight if medically necessary. 6. You MUST have a responsible adult to drive you home. 7. An adult MUST be with you the first 24 hours after you arrive home. 8. Bring a current list of your medications, and the last time and date medication taken. 9. Bring ID and current insurance cards. 10.Please wear clothes that are easy to get on and off and wear slip-on shoes.  Thank you for allowing [place] to care for you!   -- [facility] Invasive Cardiovascular services    Signed, Tonny Bollman, MD  03/29/2023 9:33 AM    [facility]

## 2023-03-29 NOTE — H&P (View-Only) (Signed)
 Cardiology Office Note:    Date:  03/29/2023   ID:  [name], [name] Mar 04, 1956, MRN [id number]  PCP:  Olivia Inch, MD   [facility] Providers Cardiologist:  Charlton Haws, MD     Referring MD: Olivia Inch, MD   Chief Complaint  Patient presents with   Shortness of Breath    History of Present Illness:    [name] is a 68 y.o. female referred by Dr [name] for evaluation of severe aortic stenosis.  The patient is here with her husband today.  She reports heart murmur since she was in her teens.  She has developed progressive aortic stenosis with a recent echocardiogram meeting criteria for severe aortic stenosis with a mean transvalvular gradient greater than 40 mmHg and a peak transaortic velocity greater than 4 m/s, calculated aortic valve area 0.4 to 0.5 cm.  The patient is disabled from low back problems.  She has had 4 back surgeries including removal of hardware for an infection and reimplantation of new hardware last August 2023.  She states that she has been on chronic suppressive antibiotics since that time.  She has not had any fevers or chills.  The patient ambulates with a walker.  States that she has had good control of her blood pressure.  She complains of exertional dyspnea and fatigue.  She is short of breath with less than 1 flight of stairs, walking less than 1 city block, or light household activities.  She has some atypical chest discomfort that is sharp and radiates down the left arm but it is fleeting in nature.  She denies orthopnea, PND, or leg swelling.  She admits to lightheadedness.  She sees the dentist regularly, has some partial plates, but denies any current issues with her teeth or gums.   Current Medications: Current Meds  Medication Sig   acetaminophen (TYLENOL) 500 MG tablet Take 1,000 mg by mouth at bedtime as needed for mild pain or headache.   albuterol (PROAIR HFA) 108 (90 BASE) MCG/ACT inhaler Inhale 2 puffs into  the lungs every 6 (six) hours as needed.   amLODipine (NORVASC) 10 MG tablet Take 1 tablet (10 mg total) by mouth daily.   carvedilol (COREG) 25 MG tablet Take 1 tablet (25 mg total) by mouth 2 (two) times daily.   celecoxib (CELEBREX) 200 MG capsule Take 200 mg by mouth 2 (two) times daily.    cetirizine (ZYRTEC) 10 MG tablet Take 1 tablet (10 mg total) by mouth daily.   docusate sodium (COLACE) 100 MG capsule Take 100 mg by mouth 2 (two) times daily.   fluticasone (FLONASE) 50 MCG/ACT nasal spray Place 2 sprays into both nostrils daily.   gabapentin (NEURONTIN) 600 MG tablet Take 600 mg by mouth 3 (three) times daily.   losartan-hydrochlorothiazide (HYZAAR) 100-25 MG tablet Take 1 tablet by mouth daily.   metFORMIN (GLUCOPHAGE) 1000 MG tablet Take 1 tablet (1,000 mg total) by mouth 2 (two) times daily with a meal.   ondansetron (ZOFRAN) 8 MG tablet Take 1 tablet (8 mg total) by mouth every 8 (eight) hours as needed for nausea or vomiting.   pantoprazole (PROTONIX) 40 MG tablet Take 40 mg by mouth daily.   rosuvastatin (CRESTOR) 10 MG tablet Take 1 tablet (10 mg total) by mouth every other day.   sulfamethoxazole-trimethoprim (BACTRIM) 400-80 MG tablet Take 1 tablet by mouth 2 (two) times daily.   tiZANidine (ZANAFLEX) 4 MG tablet Take 4 mg by mouth at  bedtime as needed for muscle spasms.     Allergies:   Crestor [rosuvastatin], Robaxin [methocarbamol], Toradol [ketorolac tromethamine], Zocor [simvastatin], Lipitor [atorvastatin], and Sulfa antibiotics   ROS:   Please see the history of present illness.    All other systems reviewed and are negative.  EKGs/Labs/Other Studies Reviewed:    The following studies were reviewed today: Cardiac Studies & Procedures     STRESS TESTS  NM PET CT CARDIAC PERFUSION MULTI W/ABSOLUTE BLOODFLOW 08/04/2022  Narrative   LV perfusion is normal. There is no evidence of ischemia. There is no evidence of infarction.   Rest left ventricular function is  normal. Rest EF: 62 %. Stress left ventricular function is normal. Stress EF: 63 %. End diastolic cavity size is normal.   Myocardial blood flow was computed to be 1.80ml/g/min at rest and 2.70ml/g/min at stress. Global myocardial blood flow reserve was 2.22 and was normal.   Coronary calcium was present on the attenuation correction CT images. Mild coronary calcifications were present. Coronary calcifications were present in the left anterior descending artery, left circumflex artery and right coronary artery distribution(s).   The study is normal. The study is low risk.  CLINICAL DATA:  This over-read does not include interpretation of cardiac or coronary anatomy or pathology. The Cardiac PET CT interpretation by the cardiologist is attached.  COMPARISON:  Chest radiograph 02/03/2022  FINDINGS: No pleural fluid.  Clear imaged lungs.  Aortic atherosclerosis.  Aortic valve calcification.  No imaged thoracic adenopathy.  Tiny hiatal hernia.  1.2 cm gallstone. Normal imaged portions of the liver, spleen, pancreas, adrenal glands, kidneys.  No acute osseous abnormality.  IMPRESSION: 1.  No acute findings in the imaged extracardiac chest. 2.  Aortic Atherosclerosis (ICD10-I70.0). 3. Aortic valvular calcifications. Consider echocardiography to evaluate for valvular dysfunction. 4.  Tiny hiatal hernia. 5. Cholelithiasis   Electronically Signed By: [name] M.D. On: 08/04/2022 12:07   ECHOCARDIOGRAM  ECHOCARDIOGRAM COMPLETE 03/15/2023  Narrative ECHOCARDIOGRAM REPORT    Patient Name:   [name] Date of Exam: 03/15/2023 Medical Rec #:  [id number]         Height:       59.0 in Accession #:    [id number]        Weight:       191.0 lb Date of Birth:  [birth year]         BSA:          1.809 m Patient Age:    67 years          BP:           161/83 mmHg Patient Gender: F                 HR:           79 bpm. Exam Location:  [address]  Procedure: 2D Echo, Cardiac  Doppler and Color Doppler  Indications:    I35.0 Aortic Stenosis  History:        Patient has prior history of Echocardiogram examinations, most recent 09/20/2022. Signs/Symptoms:Murmur and Dyspnea; Risk Factors:Hypertension, Diabetes and Dyslipidemia.  Sonographer:    Sedonia Small Rodgers-Jones RDCS Referring Phys: [id number] TESSA N CONTE  IMPRESSIONS   1. Hypertrophy best assessed in the short axis; papillary hypertrophy noted. Left ventricular ejection fraction, by estimation, is 70 to 75%. The left ventricle has hyperdynamic function. The left ventricle has no regional wall motion abnormalities. There is moderate concentric left ventricular hypertrophy. Left ventricular diastolic parameters are indeterminate.  2. Right ventricular systolic function is normal. The right ventricular size is normal. 3. The mitral valve is abnormal. Moderate mitral valve regurgitation. 4. Aortic valve gradients best assessed from right upper sternal border; suboptimal LVOT PW Doppler but severe AS by other criteria. The aortic valve is calcified. Aortic valve regurgitation is mild. Aortic regurgitation PHT measures 608 msec. Aortic valve mean gradient measures 50.0 mmHg. Aortic valve Vmax measures 4.74 m/s.  Comparison(s): Prior images reviewed side by side. Aortic valve gradients are worse (including assessment at the RUSB). MR is new.  FINDINGS Left Ventricle: Hypertrophy best assessed in the short axis; papillary hypertrophy noted. Left ventricular ejection fraction, by estimation, is 70 to 75%. The left ventricle has hyperdynamic function. The left ventricle has no regional wall motion abnormalities. The left ventricular internal cavity size was normal in size. There is moderate concentric left ventricular hypertrophy. Left ventricular diastolic parameters are indeterminate.  Right Ventricle: The right ventricular size is normal. No increase in right ventricular wall thickness. Right ventricular systolic function  is normal.  Left Atrium: Left atrial size was normal in size.  Right Atrium: Right atrial size was normal in size.  Pericardium: There is no evidence of pericardial effusion.  Mitral Valve: The mitral valve is abnormal. Moderate mitral valve regurgitation.  Tricuspid Valve: The tricuspid valve is normal in structure. Tricuspid valve regurgitation is not demonstrated. No evidence of tricuspid stenosis.  Aortic Valve: Aortic valve gradients best assessed from right upper sternal border; suboptimal LVOT PW Doppler but severe AS by other criteria. The aortic valve is calcified. Aortic valve regurgitation is mild. Aortic regurgitation PHT measures 608 msec. Aortic valve mean gradient measures 50.0 mmHg. Aortic valve peak gradient measures 89.9 mmHg. Aortic valve area, by VTI measures 0.48 cm.  Pulmonic Valve: The pulmonic valve was not well visualized. Pulmonic valve regurgitation is not visualized. No evidence of pulmonic stenosis.  Aorta: The aortic root and ascending aorta are structurally normal, with no evidence of dilitation.  IAS/Shunts: The interatrial septum appears to be lipomatous. No atrial level shunt detected by color flow Doppler.   LEFT VENTRICLE PLAX 2D LVIDd:         3.90 cm   Diastology LVIDs:         2.30 cm   LV e' medial:    6.73 cm/s LV PW:         0.90 cm   LV E/e' medial:  17.3 LV IVS:        0.90 cm   LV e' lateral:   7.46 cm/s LVOT diam:     1.50 cm   LV E/e' lateral: 15.6 LV SV:         43 LV SV Index:   24 LVOT Area:     1.77 cm   RIGHT VENTRICLE             IVC RV Basal diam:  3.50 cm     IVC diam: 1.00 cm RV S prime:     13.50 cm/s TAPSE (M-mode): 3.3 cm  LEFT ATRIUM             Index        RIGHT ATRIUM          Index LA diam:        4.80 cm 2.65 cm/m   RA Area:     9.03 cm LA Vol (A2C):   44.2 ml 24.44 ml/m  RA Volume:   17.50 ml 9.68 ml/m LA Vol (A4C):  58.4 ml 32.29 ml/m LA Biplane Vol: 52.4 ml 28.97 ml/m AORTIC VALVE AV Area (Vmax):     0.40 cm AV Area (Vmean):   0.44 cm AV Area (VTI):     0.48 cm AV Vmax:           474.00 cm/s AV Vmean:          295.800 cm/s AV VTI:            0.908 m AV Peak Grad:      89.9 mmHg AV Mean Grad:      50.0 mmHg LVOT Vmax:         106.50 cm/s LVOT Vmean:        72.850 cm/s LVOT VTI:          0.246 m LVOT/AV VTI ratio: 0.27 AI PHT:            608 msec  AORTA Ao Root diam: 2.50 cm Ao Asc diam:  3.10 cm  MITRAL VALVE MV Area (PHT): 3.65 cm     SHUNTS MV Decel Time: 208 msec     Systemic VTI:  0.25 m MV E velocity: 116.50 cm/s  Systemic Diam: 1.50 cm MV A velocity: 117.00 cm/s MV E/A ratio:  1.00  Riley Lam MD Electronically signed by Riley Lam MD Signature Date/Time: 03/15/2023/3:47:21 PM    Final             EKG:   EKG Interpretation Date/Time:  Tuesday March 29 2023 09:29:01 EST Ventricular Rate:  80 PR Interval:  152 QRS Duration:  68 QT Interval:  366 QTC Calculation: 422 R Axis:   58  Text Interpretation: Normal sinus rhythm Nonspecific T wave abnormality When compared with ECG of 04-Mar-2023 14:16, T wave inversion now evident in Inferior leads Nonspecific T wave abnormality, improved in Lateral leads Confirmed by Tonny Bollman [phone]) on 03/29/2023 9:33:04 AM    Recent Labs: 11/23/2022: Hemoglobin 10.3; Platelets 406 02/03/2023: ALT 12; BUN 23; Creatinine, Ser 1.47; Potassium 4.4; Sodium 140  Recent Lipid Panel    Component Value Date/Time   CHOL 153 02/03/2023 0847   TRIG 135 02/03/2023 0847   HDL 43 02/03/2023 0847   CHOLHDL 3.6 02/03/2023 0847   CHOLHDL 3.2 11/12/2015 1436   VLDL 28 11/12/2015 1436   LDLCALC 86 02/03/2023 0847     Risk Assessment/Calculations:                Physical Exam:    VS:  BP 128/84   Pulse 75   Ht 4\' 11"  (1.499 m)   Wt 190 lb 3.2 oz (86.3 kg)   SpO2 97%   BMI 38.42 kg/m     Wt Readings from Last 3 Encounters:  03/29/23 190 lb 3.2 oz (86.3 kg)  03/04/23 191 lb (86.6 kg)   10/26/22 187 lb (84.8 kg)     GEN:  Well nourished, well developed pleasant obese woman in no acute distress HEENT: Normal NECK: No JVD; No carotid bruits LYMPHATICS: No lymphadenopathy CARDIAC: RRR, 3/6 crescendo decrescendo murmur at the right upper sternal border RESPIRATORY:  Clear to auscultation without rales, wheezing or rhonchi  ABDOMEN: Soft, non-tender, non-distended MUSCULOSKELETAL:  No edema; No deformity  SKIN: Warm and dry NEUROLOGIC:  Alert and oriented x 3 PSYCHIATRIC:  Normal affect   Assessment & Plan Nonrheumatic aortic (valve) stenosis The patient has severe, stage D1 aortic stenosis with NYHA functional class II symptoms of exertional dyspnea and fatigue.  I have personally reviewed her echo  images which show moderate concentric LVH and normal LVEF with no regional wall motion abnormalities.  The patient's peak transaortic velocity is 4.7 m/s with peak and mean gradients of 90 and 50 mmHg, respectively.  The dimensionless index is 0.27 and calculated aortic valve area ranges between 0.4 and 0.48 cm.  The aortic valve is moderately calcified and severely restricted on 2D imaging. I have reviewed the natural history of aortic stenosis with the patient and their family members who are present today. We have discussed the limitations of medical therapy and the poor prognosis associated with symptomatic aortic stenosis. We have reviewed potential treatment options, including palliative medical therapy, conventional surgical aortic valve replacement, and transcatheter aortic valve replacement. We discussed treatment options in the context of the patient's specific comorbid medical conditions.  Her primary comorbid conditions include functional disability from low back problems, for prior lumbar surgeries, and history of hardware infection in 2023 on chronic suppressive antibiotics.  Other comorbidities include stage IIIb chronic kidney disease, hypertension, and type 2 diabetes.   The patient would like to proceed with further evaluation and will require cardiac catheterization to evaluate for obstructive CAD. I have reviewed the risks, indications, and alternatives to cardiac catheterization, possible angioplasty, and stenting with the patient. Risks include but are not limited to bleeding, infection, vascular injury, stroke, myocardial infection, arrhythmia, kidney injury, radiation-related injury in the case of prolonged fluoroscopy use, emergency cardiac surgery, and death. The patient understands the risks of serious complication is 1-2 in 1000 with diagnostic cardiac cath and 1-2% or less with angioplasty/stenting.  She will also undergo CT angiography studies of the heart as well as the chest, abdomen, and pelvis to evaluate anatomic suitability for TAVR.  Once her cardiac catheterization and CTA studies are completed, she will be referred for formal cardiac surgical consultation as part of a multidisciplinary heart team approach to her care.  During her evaluation today, I demonstrated a procedural animation of the TAVR surgery, discussed the steps and potential complications, and reviewed some of the potential advantages and disadvantages of TAVR versus surgical AVR.  Pre-procedural cardiovascular examination As outlined above, she will be prehydrated in the context of her chronic kidney disease.       Informed Consent   Shared Decision Making/Informed Consent The risks [stroke (1 in 1000), death (1 in 1000), kidney failure [usually temporary] (1 in 500), bleeding (1 in 200), allergic reaction [possibly serious] (1 in 200)], benefits (diagnostic support and management of coronary artery disease) and alternatives of a cardiac catheterization were discussed in detail with Ms. Boesel and she is willing to proceed.       Medication Adjustments/Labs and Tests Ordered: Current medicines are reviewed at length with the patient today.  Concerns regarding medicines are outlined  above.  Orders Placed This Encounter  Procedures   CBC   Basic metabolic panel   EKG 12-Lead   No orders of the defined types were placed in this encounter.   Patient Instructions  Lab Work: CBC, BMET today If you have labs (blood work) drawn today and your tests are completely normal, you will receive your results only by: MyChart Message (if you have MyChart) OR A paper copy in the mail If you have any lab test that is abnormal or we need to change your treatment, we will call you to review the results.  Testing/Procedures: L Heart Catheterization Your physician has requested that you have a cardiac catheterization. Cardiac catheterization is used to diagnose and/or treat various  heart conditions. Doctors may recommend this procedure for a number of different reasons. The most common reason is to evaluate chest pain. Chest pain can be a symptom of coronary artery disease (CAD), and cardiac catheterization can show whether plaque is narrowing or blocking your heart's arteries. This procedure is also used to evaluate the valves, as well as measure the blood flow and oxygen levels in different parts of your heart. For further information please visit [email or web address] Please follow instruction sheet, as given.  TAVR CT's and Surgical evaluation (you will be called to schedule)  Follow-Up: At [facility], you and your health needs are our priority.  As part of our continuing mission to provide you with exceptional heart care, we have created designated Provider Care Teams.  These Care Teams include your primary Cardiologist (physician) and Advanced Practice Providers (APPs -  Physician Assistants and Nurse Practitioners) who all work together to provide you with the care you need, when you need it.  Your next appointment:   Structural team will follow-up  Provider:   Tonny Bollman, MD     Other Instructions       Cardiac/Peripheral Catheterization   You are  scheduled for a Cardiac Catheterization on Wednesday, November 13 with Dr. Tonny Bollman.  1. Please arrive at the [facility] (Main Entrance A) at [facility]: [address] [place], [place] [address] at 8:00 AM (This time is five hour(s) before your procedure to ensure your preparation). Free valet parking service is available. You will check in at ADMITTING. The support person will be asked to wait in the waiting room.  It is OK to have someone drop you off and come back when you are ready to be discharged.        Special note: Every effort is made to have your procedure done on time. Please understand that emergencies sometimes delay scheduled procedures.  2. Diet: Do not eat solid foods after midnight.  You may have clear liquids until 5 AM the day of the procedure.  3. Labs: TODAY  4. Medication instructions in preparation for your procedure:   Contrast Allergy: No  DO NOT TAKE Losartan/hydrochlorothiazide the day before or day of procedure (last Monday, resume Thursday)  DO NOT TAKE Metformin the day before and HOLD for 2 days after procedure (last Monday, resume Friday)  On the morning of your procedure, take Aspirin 81 mg and any morning medicines NOT listed above.  You may use sips of water.  5. Plan to go home the same day, you will only stay overnight if medically necessary. 6. You MUST have a responsible adult to drive you home. 7. An adult MUST be with you the first 24 hours after you arrive home. 8. Bring a current list of your medications, and the last time and date medication taken. 9. Bring ID and current insurance cards. 10.Please wear clothes that are easy to get on and off and wear slip-on shoes.  Thank you for allowing [place] to care for you!   -- [facility] Invasive Cardiovascular services    Signed, Tonny Bollman, MD  03/29/2023 9:33 AM    [facility]

## 2023-03-30 ENCOUNTER — Other Ambulatory Visit: Payer: Self-pay | Admitting: Internal Medicine

## 2023-03-30 ENCOUNTER — Other Ambulatory Visit (HOSPITAL_COMMUNITY): Payer: Medicare HMO

## 2023-03-30 LAB — CBC
Hematocrit: 34.7 % (ref 34.0–46.6)
Hemoglobin: 10.4 g/dL — ABNORMAL LOW (ref 11.1–15.9)
MCH: 23.6 pg — ABNORMAL LOW (ref 26.6–33.0)
MCHC: 30 g/dL — ABNORMAL LOW (ref 31.5–35.7)
MCV: 79 fL (ref 79–97)
Platelets: 313 10*3/uL (ref 150–450)
RBC: 4.41 x10E6/uL (ref 3.77–5.28)
RDW: 16.6 % — ABNORMAL HIGH (ref 11.7–15.4)
WBC: 6.2 10*3/uL (ref 3.4–10.8)

## 2023-03-30 LAB — BASIC METABOLIC PANEL
BUN/Creatinine Ratio: 21 (ref 12–28)
BUN: 22 mg/dL (ref 8–27)
CO2: 26 mmol/L (ref 20–29)
Calcium: 10.1 mg/dL (ref 8.7–10.3)
Chloride: 105 mmol/L (ref 96–106)
Creatinine, Ser: 1.05 mg/dL — ABNORMAL HIGH (ref 0.57–1.00)
Glucose: 104 mg/dL — ABNORMAL HIGH (ref 70–99)
Potassium: 5.1 mmol/L (ref 3.5–5.2)
Sodium: 142 mmol/L (ref 134–144)
eGFR: 59 mL/min/{1.73_m2} — ABNORMAL LOW (ref >59)

## 2023-03-30 NOTE — Telephone Encounter (Signed)
DDI with metformin and Bactrim, okay to proceed?  Thanks

## 2023-03-30 NOTE — Telephone Encounter (Signed)
Continue Bactrim until she sees you next month?

## 2023-04-05 ENCOUNTER — Ambulatory Visit: Payer: Medicare HMO | Admitting: Cardiovascular Disease

## 2023-04-05 ENCOUNTER — Telehealth: Payer: Self-pay | Admitting: *Deleted

## 2023-04-05 NOTE — Telephone Encounter (Signed)
Cardiac Catheterization scheduled at [facility] for: April 06, 2023 1 PM Arrival time [facility] Main Entrance A at: 8 AM-pre-procedure hydration  Nothing to eat after midnight prior to procedure, clear liquids until 5 AM day of procedure.  Medication instructions: -Hold:  Celebrex/Hyzaar-day before and day of procedure-per protocol GFR <60  Metformin-day of procedure and 48 hours post procedure -Other usual morning medications can be taken with sips of water including aspirin 81 mg.  Patient reports she has not taken Ozempic starting the month of October.  Plan to go home the same day, you will only stay overnight if medically necessary.  You must have responsible adult to drive you home.  Someone must be with you the first 24 hours after you arrive home.  Reviewed procedure instructions with patient.

## 2023-04-06 ENCOUNTER — Other Ambulatory Visit: Payer: Self-pay

## 2023-04-06 ENCOUNTER — Encounter (HOSPITAL_COMMUNITY)
Admission: RE | Disposition: A | Discharge status: Home / Self Care | Payer: Self-pay | Source: Ambulatory Visit | Attending: Cardiovascular Disease

## 2023-04-06 ENCOUNTER — Ambulatory Visit (HOSPITAL_COMMUNITY)
Admission: RE | Admit: 2023-04-06 | Discharge: 2023-04-06 | Disposition: A | Discharge status: Home / Self Care | Payer: Medicare HMO | Source: Ambulatory Visit | Attending: Cardiovascular Disease | Admitting: Cardiovascular Disease

## 2023-04-06 DIAGNOSIS — I1310 Hypertensive heart and chronic kidney disease without heart failure, with stage 1 through stage 4 chronic kidney disease, or unspecified chronic kidney disease: Secondary | ICD-10-CM | POA: Insufficient documentation

## 2023-04-06 DIAGNOSIS — I350 Nonrheumatic aortic (valve) stenosis: Secondary | ICD-10-CM | POA: Insufficient documentation

## 2023-04-06 DIAGNOSIS — E1122 Type 2 diabetes mellitus with diabetic chronic kidney disease: Secondary | ICD-10-CM | POA: Diagnosis not present

## 2023-04-06 DIAGNOSIS — N1832 Chronic kidney disease, stage 3b: Secondary | ICD-10-CM | POA: Insufficient documentation

## 2023-04-06 DIAGNOSIS — R0602 Shortness of breath: Secondary | ICD-10-CM | POA: Diagnosis not present

## 2023-04-06 HISTORY — PX: LEFT HEART CATH AND CORONARY ANGIOGRAPHY: CATH118249

## 2023-04-06 LAB — GLUCOSE, CAPILLARY
Glucose-Capillary: 107 mg/dL — ABNORMAL HIGH (ref 70–99)
Glucose-Capillary: 96 mg/dL (ref 70–99)

## 2023-04-06 SURGERY — LEFT HEART CATH AND CORONARY ANGIOGRAPHY
Anesthesia: LOCAL

## 2023-04-06 MED ORDER — SODIUM CHLORIDE 0.9% FLUSH: 3.0000 mL | INTRAVENOUS | Status: DC | PRN

## 2023-04-06 MED ORDER — SODIUM CHLORIDE 0.9 % IV SOLN: 250.0000 mL | INTRAVENOUS | Status: DC | PRN

## 2023-04-06 MED ORDER — LIDOCAINE HCL (PF) 1 % IJ SOLN
INTRAMUSCULAR | Status: DC | PRN
  Administered 2023-04-06: 2 mL

## 2023-04-06 MED ORDER — VERAPAMIL HCL 2.5 MG/ML IV SOLN
INTRAVENOUS | Status: AC
  Filled 2023-04-06: qty 2

## 2023-04-06 MED ORDER — VERAPAMIL HCL 2.5 MG/ML IV SOLN
INTRAVENOUS | Status: DC | PRN
  Administered 2023-04-06: 10 mL via INTRA_ARTERIAL

## 2023-04-06 MED ORDER — SODIUM CHLORIDE 0.9 % WEIGHT BASED INFUSION: 1.0000 mL/kg/h | INTRAVENOUS | Status: DC

## 2023-04-06 MED ORDER — LABETALOL HCL 5 MG/ML IV SOLN: 10.0000 mg | INTRAVENOUS | Status: DC | PRN

## 2023-04-06 MED ORDER — HYDRALAZINE HCL 20 MG/ML IJ SOLN: 10.0000 mg | INTRAMUSCULAR | Status: DC | PRN

## 2023-04-06 MED ORDER — FENTANYL CITRATE (PF) 100 MCG/2ML IJ SOLN
INTRAMUSCULAR | Status: AC
  Filled 2023-04-06: qty 2

## 2023-04-06 MED ORDER — HEPARIN SODIUM (PORCINE) 1000 UNIT/ML IJ SOLN
INTRAMUSCULAR | Status: AC
Start: 2023-04-06 — End: ?
  Filled 2023-04-06: qty 10

## 2023-04-06 MED ORDER — ONDANSETRON HCL 4 MG/2ML IJ SOLN: 4.0000 mg | Freq: Four times a day (QID) | INTRAMUSCULAR | Status: DC | PRN

## 2023-04-06 MED ORDER — SODIUM CHLORIDE 0.9% FLUSH: 3.0000 mL | Freq: Two times a day (BID) | INTRAVENOUS | Status: DC

## 2023-04-06 MED ORDER — SODIUM CHLORIDE 0.9 % WEIGHT BASED INFUSION
3.0000 mL/kg/h | INTRAVENOUS | Status: AC
  Administered 2023-04-06: 3 mL/kg/h via INTRAVENOUS

## 2023-04-06 MED ORDER — ASPIRIN 81 MG PO CHEW: 81.0000 mg | CHEWABLE_TABLET | ORAL | Status: DC

## 2023-04-06 MED ORDER — LIDOCAINE HCL (PF) 1 % IJ SOLN
INTRAMUSCULAR | Status: AC
  Filled 2023-04-06: qty 30

## 2023-04-06 MED ORDER — HEPARIN SODIUM (PORCINE) 1000 UNIT/ML IJ SOLN
INTRAMUSCULAR | Status: DC | PRN
  Administered 2023-04-06: 5000 [IU] via INTRAVENOUS

## 2023-04-06 MED ORDER — HEPARIN (PORCINE) IN NACL 1000-0.9 UT/500ML-% IV SOLN
INTRAVENOUS | Status: DC | PRN
  Administered 2023-04-06 (×2): 500 mL

## 2023-04-06 MED ORDER — ACETAMINOPHEN 325 MG PO TABS: 650.0000 mg | ORAL_TABLET | ORAL | Status: DC | PRN

## 2023-04-06 MED ORDER — SODIUM CHLORIDE 0.9 % IV SOLN: INTRAVENOUS | Status: DC

## 2023-04-06 MED ORDER — FENTANYL CITRATE (PF) 100 MCG/2ML IJ SOLN
INTRAMUSCULAR | Status: DC | PRN
  Administered 2023-04-06: 25 ug via INTRAVENOUS

## 2023-04-06 MED ORDER — IOHEXOL 350 MG/ML SOLN
INTRAVENOUS | Status: DC | PRN
  Administered 2023-04-06: 35 mL via INTRA_ARTERIAL

## 2023-04-06 MED ORDER — MIDAZOLAM HCL 2 MG/2ML IJ SOLN
INTRAMUSCULAR | Status: AC
  Filled 2023-04-06: qty 2

## 2023-04-06 MED ORDER — MIDAZOLAM HCL 2 MG/2ML IJ SOLN
INTRAMUSCULAR | Status: DC | PRN
  Administered 2023-04-06: 2 mg via INTRAVENOUS

## 2023-04-06 SURGICAL SUPPLY — 10 items
CATH 5FR JL3.5 JR4 ANG PIG MP (CATHETERS) IMPLANT
DEVICE RAD COMP TR BAND LRG (VASCULAR PRODUCTS) IMPLANT
GLIDESHEATH SLEND SS 6F .021 (SHEATH) IMPLANT
GUIDEWIRE INQWIRE 1.5J.035X260 (WIRE) IMPLANT
INQWIRE 1.5J .035X260CM (WIRE) ×1
KIT ENCORE 26 ADVANTAGE (KITS) IMPLANT
KIT ESSENTIALS PG (KITS) IMPLANT
PACK CARDIAC CATHETERIZATION (CUSTOM PROCEDURE TRAY) ×1 IMPLANT
SET ATX-X65L (MISCELLANEOUS) IMPLANT
SHEATH PROBE COVER 6X72 (BAG) IMPLANT

## 2023-04-06 NOTE — Discharge Instructions (Signed)
Radial Site Care  This sheet gives you information about how to care for yourself after your procedure. Your health care provider may also give you more specific instructions. If you have problems or questions, contact your health care provider. What can I expect after the procedure? After the procedure, it is common to have: Bruising and tenderness at the catheter insertion area. Follow these instructions at home: Medicines Take over-the-counter and prescription medicines only as told by your health care provider. Insertion site care Follow instructions from your health care provider about how to take care of your insertion site. Make sure you: Wash your hands with soap and water before you remove your bandage (dressing). If soap and water are not available, use hand sanitizer. May remove dressing in 24 hours. Check your insertion site every day for signs of infection. Check for: Redness, swelling, or pain. Fluid or blood. Pus or a bad smell. Warmth. Do no take baths, swim, or use a hot tub for 5 days. You may shower 24-48 hours after the procedure. Remove the dressing and gently wash the site with plain soap and water. Pat the area dry with a clean towel. Do not rub the site. That could cause bleeding. Do not apply powder or lotion to the site. Activity  For 24 hours after the procedure, or as directed by your health care provider: Do not flex or bend the affected arm. Do not push or pull heavy objects with the affected arm. Do not drive yourself home from the hospital or clinic. You may drive 24 hours after the procedure. Do not operate machinery or power tools. KEEP ARM ELEVATED THE REMAINDER OF THE DAY. Do not push, pull or lift anything that is heavier than 10 lb for 5 days. Ask your health care provider when it is okay to: Return to work or school. Resume usual physical activities or sports. Resume sexual activity. General instructions If the catheter site starts to  bleed, raise your arm and put firm pressure on the site. If the bleeding does not stop, get help right away. This is a medical emergency. DRINK PLENTY OF FLUIDS FOR THE NEXT 2-3 DAYS. No alcohol consumption for 24 hours after receiving sedation. If you went home on the same day as your procedure, a responsible adult should be with you for the first 24 hours after you arrive home. Keep all follow-up visits as told by your health care provider. This is important. Contact a health care provider if: You have a fever. You have redness, swelling, or yellow drainage around your insertion site. Get help right away if: You have unusual pain at the radial site. The catheter insertion area swells very fast. The insertion area is bleeding, and the bleeding does not stop when you hold steady pressure on the area. Your arm or hand becomes pale, cool, tingly, or numb. These symptoms may represent a serious problem that is an emergency. Do not wait to see if the symptoms will go away. Get medical help right away. Call your local emergency services (911 in the [place] Do not drive yourself to the hospital. Summary After the procedure, it is common to have bruising and tenderness at the site. Follow instructions from your health care provider about how to take care of your radial site wound. Check the wound every day for signs of infection.  This information is not intended to replace advice given to you by your health care provider. Make sure you discuss any questions you have with   your health care provider. Document Revised: 06/15/2017 Document Reviewed: 06/15/2017 Elsevier Patient Education  2020 [employer]  

## 2023-04-06 NOTE — Interval H&P Note (Signed)
History and Physical Interval Note:  04/06/2023 2:37 PM  [name]  has presented today for surgery, with the diagnosis of aortic stenosis.  The various methods of treatment have been discussed with the patient and family. After consideration of risks, benefits and other options for treatment, the patient has consented to  Procedure(s): LEFT HEART CATH AND CORONARY ANGIOGRAPHY (N/A) as a surgical intervention.  The patient's history has been reviewed, patient examined, no change in status, stable for surgery.  I have reviewed the patient's chart and labs.  Questions were answered to the patient's satisfaction.     Tonny Bollman

## 2023-04-07 ENCOUNTER — Encounter (HOSPITAL_COMMUNITY): Payer: Self-pay | Admitting: Cardiovascular Disease

## 2023-04-08 ENCOUNTER — Other Ambulatory Visit: Payer: Self-pay

## 2023-04-08 DIAGNOSIS — I350 Nonrheumatic aortic (valve) stenosis: Secondary | ICD-10-CM

## 2023-04-14 ENCOUNTER — Encounter: Payer: Self-pay | Admitting: Physician Assistant

## 2023-04-14 ENCOUNTER — Ambulatory Visit: Payer: Medicare HMO | Attending: Pharmacist

## 2023-04-14 DIAGNOSIS — E7800 Pure hypercholesterolemia, unspecified: Secondary | ICD-10-CM

## 2023-04-14 DIAGNOSIS — I350 Nonrheumatic aortic (valve) stenosis: Secondary | ICD-10-CM | POA: Insufficient documentation

## 2023-04-15 ENCOUNTER — Ambulatory Visit (HOSPITAL_COMMUNITY)
Admission: RE | Admit: 2023-04-15 | Discharge: 2023-04-15 | Disposition: A | Discharge status: Home / Self Care | Payer: Medicare HMO | Source: Ambulatory Visit | Attending: Cardiovascular Disease | Admitting: Cardiovascular Disease

## 2023-04-15 DIAGNOSIS — I350 Nonrheumatic aortic (valve) stenosis: Secondary | ICD-10-CM | POA: Insufficient documentation

## 2023-04-15 MED ORDER — IOHEXOL 350 MG/ML SOLN
100.0000 mL | Freq: Once | INTRAVENOUS | Status: AC | PRN
  Administered 2023-04-15: 100 mL via INTRAVENOUS

## 2023-04-26 ENCOUNTER — Other Ambulatory Visit: Payer: Self-pay

## 2023-04-26 ENCOUNTER — Encounter: Payer: Self-pay | Admitting: Cardiovascular Disease

## 2023-04-26 ENCOUNTER — Ambulatory Visit: Payer: Medicare HMO | Admitting: Internal Medicine

## 2023-04-26 ENCOUNTER — Encounter: Payer: Self-pay | Admitting: Internal Medicine

## 2023-04-26 ENCOUNTER — Telehealth: Payer: Self-pay

## 2023-04-26 VITALS — BP 111/59 | HR 76 | Resp 16 | Ht <= 58 in | Wt 205.2 lb

## 2023-04-26 DIAGNOSIS — T847XXD Infection and inflammatory reaction due to other internal orthopedic prosthetic devices, implants and grafts, subsequent encounter: Secondary | ICD-10-CM | POA: Diagnosis not present

## 2023-04-26 DIAGNOSIS — M4620 Osteomyelitis of vertebra, site unspecified: Secondary | ICD-10-CM | POA: Diagnosis not present

## 2023-04-26 DIAGNOSIS — R0600 Dyspnea, unspecified: Secondary | ICD-10-CM

## 2023-04-26 DIAGNOSIS — Z79899 Other long term (current) drug therapy: Secondary | ICD-10-CM

## 2023-04-26 NOTE — Telephone Encounter (Signed)
Call to patient to discuss shortness of breath. Patient states that she has noticed she is more SOB for the past 2 weeks. She states any exertion, any household chores requires her to sit down and rest. She has an inhaler and states she has tried using it when she feels sob without and relief. Patient does not check her HR and BP at home but denies any dizziness, head ache, palpitations, chest pain. She states when she saw Dr. Excell Seltzer on 03/29/23 about her aortic valve, she did note some SOB on exertion at that time but it has worsened since then. She saw Dr. Eden Emms in early October and was not experiencing SOB at that time.   Patient currently has an TCTS appt scheduled for 05/06/23 and states "I think my SOB may be related to my bad valve" but is asking if there is anything else she can do to alleviate her SOB on exertion besides resting when it occurs.

## 2023-04-26 NOTE — Telephone Encounter (Signed)
Call to patient to discuss Dr. Fabio Bering recommendations. Patient agrees to chest xray, order placed. Labs scheduled for tomorrow as I was unable to reach [facility] to add on lab. Patient verbalizes understanding that based on results of CXR and BNP, Dr. Eden Emms may order a course of lasix to improve her breathing.

## 2023-04-26 NOTE — Addendum Note (Signed)
Addended by: Luellen Pucker on: 04/26/2023 02:51 PM   Modules accepted: Orders

## 2023-04-26 NOTE — Progress Notes (Signed)
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   Bacteremia due to Proteus species    Sepsis (HCC) 02/03/2022   Iron deficiency anemia 02/03/2022   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   S/P lumbar spinal fusion 11/17/2021   Lumbar radiculopathy 04/21/2021   Type 2 diabetes mellitus without complication, without long-term current use of insulin (HCC) 12/22/2018   Hypertriglyceridemia 10/19/2018   Spondylolisthesis of lumbar region 02/01/2018   Gastroesophageal reflux disease without esophagitis 01/16/2018   Skin inflammation 08/25/2017   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   Essential hypertension 03/06/2017   Osteoarthritis of left hip 04/07/2014   BMI 40.0-44.9, adult (HCC) 04/07/2014   DM type 2 (diabetes mellitus, type 2) (HCC)    HTN (hypertension)    Hypercholesterolemia    Reflux       Subjective:    Patient ID: Mannie Stabile, female    DOB: [birth year], 67 y.o.   MRN: [id number]  Chief Complaint  Patient presents with   Follow-up    Heart procedure in January 2025.     HPI:  [name] is a 67 y.o. female here for f/u hx 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  She had associated bacteremia proteus mirabilis blood stream infection 02/03/2022, in setting mri imaging lumbar surgical site abscess/cellulitis. S/p I&D with all old hardware removed but new  screws placed on 9/15  04/07/22 id clinic visit She had finished iv ceftriaxone of 8 weeks by 04/02/2022 and transitioned to bactrim ss bid; the cefazolin mic was 8  She walks with fww. She is sore in lower back bilaterally when moving but nothing in the middle. This is chronic. 7 at worst; 2/10 resting   No fever, chill, diarrhea   05/05/22 id clinic visit She had chart hx of sulfa allergy although she doesn't recall any bactrim use or other sulfa product use in the past She tolerated bactrim ss bid fine without n/v/diarrhea/rash Back pain is stable No f/c  She complains of 3-4 weeks right anterior thigh numbness/tingling. She wears tight clothing now and then but not consistently.   She has right lower ext weakness after back surgery but that is getting better. Lower back pain 3/10 not bad. Bilateral lower back sore/stiff after pt/ot session though. Overall since back surgery 01/2022 leg weakness/back pain better. Walks with walker still  Also complains of chronic tongue burning with things like toothpaste, spicy food. Sensation transient. Doesn't disturb her in other ways. This was heard while she was on ceftriaxone as well.      10/26/22 id clinic f/u Crp has been high still. She continues on bactrim ds 1 tab bid She still have significant nausea but controlled with zofran Back pain 2/10 minimal like before We spoke about trial off medication abx 07/2022 but given crp will keep going, until we get 2 normal over 6 months span before considering it again  04/26/23 id clinic f/u    Component Value Date/Time   CRP 18.5 (H) 10/26/2022 1044   CRP 26.3 (H) 08/03/2022 0212   CRP 20.8 (H) 05/05/2022 1436   Lab Results  Component Value Date   CREATININE 1.05 (H) 03/29/2023   Patient is here for f/u today She has been seeing cardiology for aortic stenosis planned for January Patient still taking bactrim suppressive abx She said she has gained a lot of weight and more dyspnea  since seeing cardiology 2-3 weeks ago -- she hasn't spoken with them yet  She doesnot take any loop diuretics  "The back is good". Right leg anterior pain stable/chronic since surgery No fever/chill  Appetite is "too good."        Allergies: Allergies  Allergen Reactions   Crestor [Rosuvastatin] Other (See Comments)    Myalgia    Robaxin [Methocarbamol] Other (See Comments)    Insomnia   Toradol [Ketorolac Tromethamine] Other (See Comments)   Zocor [Simvastatin] Other (See Comments)    Myalgias    Lipitor [Atorvastatin] Other (See Comments)    Myalgia   Sulfa Antibiotics Hives      Outpatient Medications Prior to Visit  Medication Sig Dispense Refill   acetaminophen (TYLENOL) 500 MG tablet Take 1,000 mg by mouth at bedtime as needed for mild pain or headache.     albuterol (PROAIR HFA) 108 (90 BASE) MCG/ACT inhaler Inhale 2 puffs into the lungs every 6 (six) hours as needed. 1 Inhaler 12   amLODipine (NORVASC) 10 MG tablet Take 1 tablet (10 mg total) by mouth daily. 90 tablet 1   ascorbic acid (VITAMIN C) 500 MG tablet Take 500 mg by mouth daily.     carvedilol (COREG) 25 MG tablet Take 1 tablet (25 mg total) by mouth 2 (two) times daily. 60 tablet 0   celecoxib (CELEBREX) 200 MG capsule Take 200 mg by mouth 2 (two) times daily.   1   cetirizine (ZYRTEC) 10 MG tablet Take 1 tablet (10 mg total) by mouth daily. 30 tablet 0   docusate sodium (COLACE) 100 MG capsule Take 100 mg by mouth 2 (two) times daily.     fluticasone (FLONASE) 50 MCG/ACT nasal spray Place 2 sprays into both nostrils daily. (Patient taking differently: Place 2 sprays into both nostrils daily as needed for rhinitis.) 16 g 0   gabapentin (NEURONTIN) 600 MG tablet Take 600 mg by mouth 3 (three) times daily.     iron polysaccharides (NIFEREX) 150 MG capsule TAKE 1 CAPSULE (150 MG DOSE) BY MOUTH TWICE A DAY (Patient taking differently: Take 150 mg by mouth daily.) 180 capsule 1   losartan-hydrochlorothiazide  (HYZAAR) 100-25 MG tablet Take 1 tablet by mouth daily.     metFORMIN (GLUCOPHAGE) 1000 MG tablet Take 1 tablet (1,000 mg total) by mouth 2 (two) times daily with a meal. 180 tablet 1   pantoprazole (PROTONIX) 40 MG tablet Take 40 mg by mouth daily.     polyvinyl alcohol (LIQUIFILM TEARS) 1.4 % ophthalmic solution Place 1 drop into both eyes as needed for dry eyes.     rosuvastatin (CRESTOR) 10 MG tablet Take 1 tablet (10 mg total) by mouth every other day. 45 tablet 3   Semaglutide, 2 MG/DOSE, (OZEMPIC, 2 MG/DOSE,) 8 MG/3ML SOPN Inject 2 mg into the skin once a week.     sulfamethoxazole-trimethoprim (BACTRIM) 400-80 MG tablet TAKE 1 TABLET BY MOUTH TWICE DAILY 60 tablet 1   No facility-administered medications prior to visit.  Social History   Socioeconomic History   Marital status: Married    Spouse name: Not on file   Number of children: Not on file   Years of education: Not on file   Highest education level: Not on file  Occupational History   Not on file  Tobacco Use   Smoking status: Never   Smokeless tobacco: Never  Vaping Use   Vaping status: Never Used  Substance and Sexual Activity   Alcohol use: No    Alcohol/week: 0.0 standard drinks of alcohol   Drug use: No   Sexual activity: Yes  Other Topics Concern   Not on file  Social History Narrative   Not on file   Social Determinants of Health   Financial Resource Strain: Low Risk  (04/13/2023)   Received from [facility]   Overall Financial Resource Strain (CARDIA)    Difficulty of Paying Living Expenses: Not hard at all  Food Insecurity: No Food Insecurity (04/13/2023)   [facility]   Hunger Vital Sign    Worried About Running Out of Food in the Last Year: Never true    Ran Out of Food in the Last Year: Never true  Transportation Needs: No Transportation Needs (04/13/2023)   Received from [facility] - Transportation    Lack of Transportation (Medical): No    Lack of  Transportation (Non-Medical): No  Physical Activity: Inactive (04/13/2023)   Received from [facility]   Exercise Vital Sign    Days of Exercise per Week: 0 days    Minutes of Exercise per Session: 30 min  Stress: No Stress Concern Present (04/13/2023)   Received from [facility] - Occupational Stress Questionnaire    Feeling of Stress : Only a little  Social Connections: Somewhat Isolated (04/13/2023)   Received [facility]   Social Network    How would you rate your social network (family, work, friends)?: Restricted participation with some degree of social isolation  Intimate Partner Violence: Not At Risk (04/13/2023)   Received from [facility]   HITS    Over the last 12 months how often did your partner physically hurt you?: Never    Over the last 12 months how often did your partner insult you or talk down to you?: Never    Over the last 12 months how often did your partner threaten you with physical harm?: Never    Over the last 12 months how often did your partner scream or curse at you?: Never      Review of Systems    All other ros negative Objective:    BP (!) 111/59   Pulse 76   Resp 16   Ht 4\' 10"  (1.473 m)   Wt 205 lb 3.2 oz (93.1 kg)   SpO2 96%   BMI 42.89 kg/m  Nursing note and vital signs reviewed.  Physical Exam  General/constitutional: no distress, pleasant; walks with fww HEENT: Normocephalic, PER, Conj Clear, EOMI, Oropharynx clear Neck supple CV: rrr no mrg Lungs: clear to auscultation, normal respiratory effort Abd: Soft, Nontender Ext: facial plethora; bilateral LE trace edema Skin: No Rash Neuro: nonfocal MSK: no peripheral joint swelling/tenderness/warmth; back spines nontender      Labs: Lab Results  Component Value Date   WBC 6.2 03/29/2023   HGB 10.4 (L) 03/29/2023   HCT 34.7 03/29/2023   MCV 79 03/29/2023   PLT 313 03/29/2023   Last metabolic  panel Lab Results   Component Value Date   GLUCOSE 104 (H) 03/29/2023   NA 142 03/29/2023   K 5.1 03/29/2023   CL 105 03/29/2023   CO2 26 03/29/2023   BUN 22 03/29/2023   CREATININE 1.05 (H) 03/29/2023   GFRNONAA >60 02/08/2022   CALCIUM 10.1 03/29/2023   PROT 7.2 02/03/2023   ALBUMIN 4.5 02/03/2023   LABGLOB 2.7 02/03/2023   AGRATIO 1.7 08/19/2017   BILITOT <0.2 02/03/2023   ALKPHOS 82 02/03/2023   AST 15 02/03/2023   ALT 12 02/03/2023   ANIONGAP 10 02/08/2022   Crp: 08/03/22     26.3   (<8) 04/2022    20.8   (<8)   Micro:  Serology:  Imaging: 03/31/22 mri lumbar spine 1. Postoperative changes since the prior MRI. No findings suspicious for persistent discitis, epidural abscess or paraspinal abscess. 2. New marrow signal abnormality in the L2 and L3 vertebral bodies and subsequent enhancement but no enhancement in the disc space to suggest persistent discitis. This could be remodeling changes related to the recent surgery and new hardware. 3. Some residual prevertebral inflammatory changes but no rim enhancing abscess.  Assessment & Plan:   Problem List Items Addressed This Visit   None Visit Diagnoses     Hardware complicating wound infection, subsequent encounter    -  Primary   Relevant Orders   C-reactive protein   COMPLETE METABOLIC PANEL WITH GFR   CBC w/Diff   Vertebral osteomyelitis (HCC)            No orders of the defined types were placed in this encounter.    #Post-op lumbar infection SP I&D and HW removal with Cx+ Proteus mirabilis  on 02/05/22; and placement of new screws into infected spine #Psoas abscess #Proteus mirabilis bacteremia  -02/03/22 blood cultures 1/2 Proteus mirabilis.  Initially started on vancomycin, cefepime, metronidazole. - initial MRI L-spine showed epidural abscess extending anteriorly from soft tissue to fusion hardware L3-L4. 1.1x0.8 cm focal area in right psoas muscle suspicious for small abscess - Underwent I&D with hardware  removal on 9/15 with neurosurgery Dr. Franky Macho.  Of note all the old hardware was removed new pedicle screws were placed.  ID was engaged and plan for 8 weeks of antibiotics more.  OR cultures grew Proteus mirabilis with cefazolin MIC 8  as such switched  cefazolin to ceftriaxone.   - s/p 8 weeks ceftriaxone by 11/10, switched to bactrim ss bid chronically  05/05/22 id assessment Repeat mri lumbar spine 03/31/22 showed no further abscess Clinically also improving   08/03/22 id clinic assessment Doing very well Discuss with her trial off abx Labs today and f/u 6-8 weeks when she'll decide on abx continuation  10/26/22 id clinic assessment Due to still elevated crp without obvious explanation, and despite minimal back pain, will keep going with bactrim for now until we have 2 normal at least 6 months apart She would like more zofran amount in hand for her nausea with bactrim Labs today for toxicity monitoring and crp monitoring Follow up 6 months Continue bactrim 1 ds tablet twice a day  04/26/23 id clinic assessment Back seems to be doing well AS awaiting tavr -- her infection is suppressed and shouldn't be a contraindication for tavr if needed Labs today Will revisit trial off abx at the appropriate time (after tavr and only with persistently normal crp) Continue bactrim SS bid suppression   Advise patient to call cardiology clinic regarding recent more rapid weight gain/dyspnea.  Follow-up: Return in about 3 months (around 07/25/2023).      Raymondo Band, MD [facility] 04/26/2023, 10:36 AM

## 2023-04-26 NOTE — Telephone Encounter (Signed)
Patient does state resting relieves her SOB.

## 2023-04-26 NOTE — Addendum Note (Signed)
Addended by: Rexene Edison L on: 04/26/2023 03:00 PM   Modules accepted: Orders

## 2023-04-26 NOTE — Patient Instructions (Signed)
See me 3 months from now to repeat labs   Labs today   Continue bactrim tablet twice a day for suppresion  I would like to wait at least after TAVR and make sure you have normal CRP numbers before I consider taking you off antibiotics   Please call your cardiology clinic about weight gain and dyspnea

## 2023-04-27 ENCOUNTER — Emergency Department (HOSPITAL_BASED_OUTPATIENT_CLINIC_OR_DEPARTMENT_OTHER): Payer: Medicare HMO

## 2023-04-27 ENCOUNTER — Other Ambulatory Visit: Payer: Self-pay

## 2023-04-27 ENCOUNTER — Encounter (HOSPITAL_BASED_OUTPATIENT_CLINIC_OR_DEPARTMENT_OTHER): Payer: Self-pay | Admitting: Internal Medicine

## 2023-04-27 ENCOUNTER — Inpatient Hospital Stay (HOSPITAL_BASED_OUTPATIENT_CLINIC_OR_DEPARTMENT_OTHER)
Admission: EM | Admit: 2023-04-27 | Discharge: 2023-04-30 | DRG: 291 | Disposition: A | Discharge status: Home / Self Care | Payer: Medicare HMO | Attending: Internal Medicine | Admitting: Internal Medicine

## 2023-04-27 ENCOUNTER — Other Ambulatory Visit: Payer: Self-pay | Admitting: Pharmacist

## 2023-04-27 DIAGNOSIS — E1122 Type 2 diabetes mellitus with diabetic chronic kidney disease: Secondary | ICD-10-CM | POA: Diagnosis present

## 2023-04-27 DIAGNOSIS — I350 Nonrheumatic aortic (valve) stenosis: Secondary | ICD-10-CM | POA: Diagnosis present

## 2023-04-27 DIAGNOSIS — I130 Hypertensive heart and chronic kidney disease with heart failure and stage 1 through stage 4 chronic kidney disease, or unspecified chronic kidney disease: Principal | ICD-10-CM | POA: Diagnosis present

## 2023-04-27 DIAGNOSIS — F419 Anxiety disorder, unspecified: Secondary | ICD-10-CM | POA: Diagnosis present

## 2023-04-27 DIAGNOSIS — N1831 Chronic kidney disease, stage 3a: Secondary | ICD-10-CM | POA: Diagnosis present

## 2023-04-27 DIAGNOSIS — I509 Heart failure, unspecified: Secondary | ICD-10-CM

## 2023-04-27 DIAGNOSIS — I5032 Chronic diastolic (congestive) heart failure: Secondary | ICD-10-CM | POA: Diagnosis present

## 2023-04-27 DIAGNOSIS — K219 Gastro-esophageal reflux disease without esophagitis: Secondary | ICD-10-CM | POA: Diagnosis present

## 2023-04-27 DIAGNOSIS — Z981 Arthrodesis status: Secondary | ICD-10-CM

## 2023-04-27 DIAGNOSIS — Z6841 Body Mass Index (BMI) 40.0 and over, adult: Secondary | ICD-10-CM

## 2023-04-27 DIAGNOSIS — I5021 Acute systolic (congestive) heart failure: Secondary | ICD-10-CM

## 2023-04-27 DIAGNOSIS — Z882 Allergy status to sulfonamides status: Secondary | ICD-10-CM

## 2023-04-27 DIAGNOSIS — Z792 Long term (current) use of antibiotics: Secondary | ICD-10-CM

## 2023-04-27 DIAGNOSIS — Z888 Allergy status to other drugs, medicaments and biological substances status: Secondary | ICD-10-CM

## 2023-04-27 DIAGNOSIS — T8141XA Infection following a procedure, superficial incisional surgical site, initial encounter: Secondary | ICD-10-CM | POA: Diagnosis present

## 2023-04-27 DIAGNOSIS — Z7984 Long term (current) use of oral hypoglycemic drugs: Secondary | ICD-10-CM

## 2023-04-27 DIAGNOSIS — X58XXXA Exposure to other specified factors, initial encounter: Secondary | ICD-10-CM | POA: Clinically undetermined

## 2023-04-27 DIAGNOSIS — E7800 Pure hypercholesterolemia, unspecified: Secondary | ICD-10-CM

## 2023-04-27 DIAGNOSIS — Z881 Allergy status to other antibiotic agents status: Secondary | ICD-10-CM

## 2023-04-27 DIAGNOSIS — D631 Anemia in chronic kidney disease: Secondary | ICD-10-CM | POA: Diagnosis present

## 2023-04-27 DIAGNOSIS — Z713 Dietary counseling and surveillance: Secondary | ICD-10-CM

## 2023-04-27 DIAGNOSIS — Z8249 Family history of ischemic heart disease and other diseases of the circulatory system: Secondary | ICD-10-CM

## 2023-04-27 DIAGNOSIS — Z7985 Long-term (current) use of injectable non-insulin antidiabetic drugs: Secondary | ICD-10-CM

## 2023-04-27 DIAGNOSIS — Z79899 Other long term (current) drug therapy: Secondary | ICD-10-CM

## 2023-04-27 DIAGNOSIS — I5033 Acute on chronic diastolic (congestive) heart failure: Secondary | ICD-10-CM

## 2023-04-27 DIAGNOSIS — N179 Acute kidney failure, unspecified: Secondary | ICD-10-CM | POA: Diagnosis present

## 2023-04-27 DIAGNOSIS — J9601 Acute respiratory failure with hypoxia: Principal | ICD-10-CM | POA: Diagnosis present

## 2023-04-27 DIAGNOSIS — M1612 Unilateral primary osteoarthritis, left hip: Secondary | ICD-10-CM | POA: Diagnosis present

## 2023-04-27 DIAGNOSIS — I161 Hypertensive emergency: Secondary | ICD-10-CM | POA: Diagnosis present

## 2023-04-27 LAB — COMPLETE METABOLIC PANEL WITH GFR
AG Ratio: 1.5 (calc) (ref 1.0–2.5)
ALT: 14 U/L (ref 6–29)
AST: 17 U/L (ref 10–35)
Albumin: 4.4 g/dL (ref 3.6–5.1)
Alkaline phosphatase (APISO): 76 U/L (ref 37–153)
BUN/Creatinine Ratio: 16 (calc) (ref 6–22)
BUN: 23 mg/dL (ref 7–25)
CO2: 30 mmol/L (ref 20–32)
Calcium: 9.8 mg/dL (ref 8.6–10.4)
Chloride: 103 mmol/L (ref 98–110)
Creat: 1.4 mg/dL — ABNORMAL HIGH (ref 0.50–1.05)
Globulin: 2.9 g/dL (ref 1.9–3.7)
Glucose, Bld: 117 mg/dL — ABNORMAL HIGH (ref 65–99)
Potassium: 4.8 mmol/L (ref 3.5–5.3)
Sodium: 141 mmol/L (ref 135–146)
Total Bilirubin: 0.3 mg/dL (ref 0.2–1.2)
Total Protein: 7.3 g/dL (ref 6.1–8.1)
eGFR: 42 mL/min/{1.73_m2} — ABNORMAL LOW (ref >=60)

## 2023-04-27 LAB — CBC WITH DIFFERENTIAL/PLATELET
Abs Immature Granulocytes: 0.52 10*3/uL — ABNORMAL HIGH (ref 0.00–0.07)
Absolute Lymphocytes: 1937 {cells}/uL (ref 850–3900)
Absolute Monocytes: 540 {cells}/uL (ref 200–950)
Basophils Absolute: 72 {cells}/uL (ref 0–200)
Basophils Absolute: 0.1 10*3/uL (ref 0.0–0.1)
Basophils Relative: 1.1 %
Basophils Relative: 1 %
Eosinophils Absolute: 319 {cells}/uL (ref 15–500)
Eosinophils Absolute: 0.4 10*3/uL (ref 0.0–0.5)
Eosinophils Relative: 4.9 %
Eosinophils Relative: 3 %
HCT: 30.4 % — ABNORMAL LOW (ref 35.0–45.0)
HCT: 32.4 % — ABNORMAL LOW (ref 36.0–46.0)
Hemoglobin: 9.2 g/dL — ABNORMAL LOW (ref 11.7–15.5)
Hemoglobin: 9.8 g/dL — ABNORMAL LOW (ref 12.0–15.0)
Immature Granulocytes: 5 %
Lymphocytes Relative: 28 %
Lymphs Abs: 3.2 10*3/uL (ref 0.7–4.0)
MCH: 23.7 pg — ABNORMAL LOW (ref 27.0–33.0)
MCH: 24.3 pg — ABNORMAL LOW (ref 26.0–34.0)
MCHC: 30.3 g/dL — ABNORMAL LOW (ref 32.0–36.0)
MCHC: 30.2 g/dL (ref 30.0–36.0)
MCV: 78.1 fL — ABNORMAL LOW (ref 80.0–100.0)
MCV: 80.2 fL (ref 80.0–100.0)
MPV: 11.3 fL (ref 7.5–12.5)
Monocytes Absolute: 0.9 10*3/uL (ref 0.1–1.0)
Monocytes Relative: 8.3 %
Monocytes Relative: 8 %
Neutro Abs: 3634 {cells}/uL (ref 1500–7800)
Neutro Abs: 6.1 10*3/uL (ref 1.7–7.7)
Neutrophils Relative %: 55.9 %
Neutrophils Relative %: 55 %
Platelets: 319 10*3/uL (ref 140–400)
Platelets: 371 10*3/uL (ref 150–400)
RBC: 3.89 10*6/uL (ref 3.80–5.10)
RBC: 4.04 MIL/uL (ref 3.87–5.11)
RDW: 16.7 % — ABNORMAL HIGH (ref 11.0–15.0)
RDW: 19.1 % — ABNORMAL HIGH (ref 11.5–15.5)
Total Lymphocyte: 29.8 %
WBC: 6.5 10*3/uL (ref 3.8–10.8)
WBC: 11.2 10*3/uL — ABNORMAL HIGH (ref 4.0–10.5)
nRBC: 0.4 % — ABNORMAL HIGH (ref 0.0–0.2)

## 2023-04-27 LAB — BASIC METABOLIC PANEL
Anion gap: 11 (ref 5–15)
BUN: 25 mg/dL — ABNORMAL HIGH (ref 8–23)
CO2: 26 mmol/L (ref 22–32)
Calcium: 10.4 mg/dL — ABNORMAL HIGH (ref 8.9–10.3)
Chloride: 103 mmol/L (ref 98–111)
Creatinine, Ser: 1.34 mg/dL — ABNORMAL HIGH (ref 0.44–1.00)
GFR, Estimated: 44 mL/min — ABNORMAL LOW (ref >60)
Glucose, Bld: 143 mg/dL — ABNORMAL HIGH (ref 70–99)
Potassium: 4.1 mmol/L (ref 3.5–5.1)
Sodium: 140 mmol/L (ref 135–145)

## 2023-04-27 LAB — BRAIN NATRIURETIC PEPTIDE: B Natriuretic Peptide: 134.9 pg/mL — ABNORMAL HIGH (ref 0.0–100.0)

## 2023-04-27 LAB — C-REACTIVE PROTEIN: CRP: 30.7 mg/L — ABNORMAL HIGH (ref <8.0)

## 2023-04-27 LAB — TROPONIN I (HIGH SENSITIVITY)
Troponin I (High Sensitivity): 18 ng/L — ABNORMAL HIGH (ref <18)
Troponin I (High Sensitivity): 25 ng/L — ABNORMAL HIGH (ref <18)

## 2023-04-27 MED ORDER — NITROGLYCERIN IN D5W 200-5 MCG/ML-% IV SOLN
0.0000 ug/min | INTRAVENOUS | Status: DC
  Administered 2023-04-27: 5 ug/min via INTRAVENOUS
  Filled 2023-04-27: qty 250

## 2023-04-27 MED ORDER — AMLODIPINE BESYLATE 10 MG PO TABS
10.0000 mg | ORAL_TABLET | Freq: Every day | ORAL | Status: DC
  Administered 2023-04-28 – 2023-04-30 (×3): 10 mg via ORAL
  Filled 2023-04-27 (×3): qty 1

## 2023-04-27 MED ORDER — CARVEDILOL 25 MG PO TABS
25.0000 mg | ORAL_TABLET | Freq: Two times a day (BID) | ORAL | Status: DC
  Administered 2023-04-28 – 2023-04-30 (×5): 25 mg via ORAL
  Filled 2023-04-27 (×5): qty 1

## 2023-04-27 MED ORDER — HYDROCHLOROTHIAZIDE 25 MG PO TABS: 25.0000 mg | ORAL_TABLET | Freq: Every day | ORAL | Status: DC

## 2023-04-27 MED ORDER — LOSARTAN POTASSIUM 50 MG PO TABS
100.0000 mg | ORAL_TABLET | Freq: Every day | ORAL | Status: DC
Start: 2023-04-28 — End: 2023-04-28

## 2023-04-27 MED ORDER — LOSARTAN POTASSIUM-HCTZ 100-25 MG PO TABS: 1.0000 | ORAL_TABLET | Freq: Every day | ORAL | Status: DC

## 2023-04-27 MED ORDER — FUROSEMIDE 10 MG/ML IJ SOLN
40.0000 mg | Freq: Once | INTRAMUSCULAR | Status: AC
  Administered 2023-04-27: 40 mg via INTRAVENOUS
  Filled 2023-04-27: qty 4

## 2023-04-27 NOTE — ED Notes (Signed)
ED Provider at bedside. 

## 2023-04-27 NOTE — ED Triage Notes (Signed)
Pt POV from home reporting SOB that began this morning and worsened tonight. Unable to speak full sentences. Pt 85% on 4L

## 2023-04-27 NOTE — ED Notes (Signed)
ED TO INPATIENT HANDOFF REPORT  ED Nurse Name and Phone #:  Cynitha Berte o rn S Name/Age/Gender [name] 67 y.o. female Room/Bed: DB001/DB001  Code Status   Code Status: Prior  Home/SNF/Other Home Patient oriented to: self, place, time, and situation Is this baseline? Yes   Triage Complete: Triage complete  Chief Complaint Acute exacerbation of CHF (congestive heart failure) (HCC) [I50.9]  Triage Note Pt POV from home reporting SOB that began this morning and worsened tonight. Unable to speak full sentences. Pt 85% on 4L   Allergies Allergies  Allergen Reactions   Crestor [Rosuvastatin] Other (See Comments)    Myalgia    Robaxin [Methocarbamol] Other (See Comments)    Insomnia   Toradol [Ketorolac Tromethamine] Other (See Comments)   Zocor [Simvastatin] Other (See Comments)    Myalgias    Lipitor [Atorvastatin] Other (See Comments)    Myalgia   Sulfa Antibiotics Hives    Level of Care/Admitting Diagnosis ED Disposition     ED Disposition  Admit   Condition  --   Comment  Hospital Area: [facility] [[id number]]  Level of Care: Progressive [102]  Admit to Progressive based on following criteria: CARDIOVASCULAR & THORACIC of moderate stability with acute coronary syndrome symptoms/low risk myocardial infarction/hypertensive urgency/arrhythmias/heart failure potentially compromising stability and stable post cardiovascular intervention patients.  May admit patient to [name] or [facility] if equivalent level of care is available:: No  Interfacility transfer: Yes  Covid Evaluation: Asymptomatic - no recent exposure (last 10 days) testing not required  Diagnosis: Acute exacerbation of CHF (congestive heart failure) [facility]) [[id number]]  Admitting Physician: Tereasa Coop [[id number]]  Attending Physician: Tereasa Coop [[id number]]  Certification:: I certify this patient will need inpatient services for at least 2 midnights  Expected Medical  Readiness: 05/02/2023          B Medical/Surgery History Past Medical History:  Diagnosis Date   Allergy    Anemia    Anxiety    Arthritis    Asthma    BMI 40.0-44.9, adult (HCC) 04/07/2014   DM type 2 (diabetes mellitus, type 2) (HCC)    GERD (gastroesophageal reflux disease)    HTN (hypertension)    Hypercholesterolemia    Osteoarthritis of left hip 04/07/2014   Reflux    Severe aortic stenosis    Spinal headache    with C-Section and with spinal fusion in 2019   Past Surgical History:  Procedure Laterality Date   CESAREAN SECTION     x3   COLONOSCOPY     LAMINECTOMY  03/2021   LEFT HEART CATH AND CORONARY ANGIOGRAPHY N/A 04/06/2023   Procedure: LEFT HEART CATH AND CORONARY ANGIOGRAPHY;  Surgeon: Tonny Bollman, MD;  Location: [facility] INVASIVE CV LAB;  Service: Cardiovascular;  Laterality: N/A;   SPINAL FUSION  2019   TUBAL LIGATION       A IV Location/Drains/Wounds Patient Lines/Drains/Airways Status     Active Line/Drains/Airways     Name Placement date Placement time Site Days   Peripheral IV 04/27/23 20 G Left Antecubital 04/27/23  1845  Antecubital  less than 1   External Urinary Catheter 04/27/23  2030  --  less than 1   External Urinary Catheter 04/27/23  2022  --  less than 1            Intake/Output Last 24 hours No intake or output data in the 24 hours ending 04/27/23 2124  Labs/Imaging Results for orders placed or performed during the  hospital encounter of 04/27/23 (from the past 48 hour(s))  CBC with Differential     Status: Abnormal   Collection Time: 04/27/23  6:45 PM  Result Value Ref Range   WBC 11.2 (H) 4.0 - 10.5 K/uL   RBC 4.04 3.87 - 5.11 MIL/uL   Hemoglobin 9.8 (L) 12.0 - 15.0 g/dL   HCT 08.6 (L) 57.8 - 46.9 %   MCV 80.2 80.0 - 100.0 fL   MCH 24.3 (L) 26.0 - 34.0 pg   MCHC 30.2 30.0 - 36.0 g/dL   RDW 62.9 (H) 52.8 - 41.3 %   Platelets 371 150 - 400 K/uL   nRBC 0.4 (H) 0.0 - 0.2 %   Neutrophils Relative % 55 %   Neutro Abs  6.1 1.7 - 7.7 K/uL   Lymphocytes Relative 28 %   Lymphs Abs 3.2 0.7 - 4.0 K/uL   Monocytes Relative 8 %   Monocytes Absolute 0.9 0.1 - 1.0 K/uL   Eosinophils Relative 3 %   Eosinophils Absolute 0.4 0.0 - 0.5 K/uL   Basophils Relative 1 %   Basophils Absolute 0.1 0.0 - 0.1 K/uL   Immature Granulocytes 5 %   Abs Immature Granulocytes 0.52 (H) 0.00 - 0.07 K/uL    Comment: Performed at [facility], [address] [place], [place] [address]  Basic metabolic panel     Status: Abnormal   Collection Time: 04/27/23  6:45 PM  Result Value Ref Range   Sodium 140 135 - 145 mmol/L   Potassium 4.1 3.5 - 5.1 mmol/L   Chloride 103 98 - 111 mmol/L   CO2 26 22 - 32 mmol/L   Glucose, Bld 143 (H) 70 - 99 mg/dL    Comment: Glucose reference range applies only to samples taken after fasting for at least 8 hours.   BUN 25 (H) 8 - 23 mg/dL   Creatinine, Ser 0.27 (H) 0.44 - 1.00 mg/dL   Calcium 25.3 (H) 8.9 - 10.3 mg/dL   GFR, Estimated 44 (L) >60 mL/min    Comment: (NOTE) Calculated using the CKD-EPI Creatinine Equation (2021)    Anion gap 11 5 - 15    Comment: Performed at [facility], [address], [place], [place] [address]  Troponin I (High Sensitivity)     Status: Abnormal   Collection Time: 04/27/23  6:45 PM  Result Value Ref Range   Troponin I (High Sensitivity) 18 (H) <18 ng/L    Comment: (NOTE) Elevated high sensitivity troponin I (hsTnI) values and significant  changes across serial measurements may suggest ACS but many other  chronic and acute conditions are known to elevate hsTnI results.  Refer to the "Links" section for chest pain algorithms and additional  guidance. Performed at [facility], [address], [place], [place] [address]   Brain natriuretic peptide     Status: Abnormal   Collection Time: 04/27/23  6:45 PM  Result Value Ref Range   B Natriuretic Peptide 134.9 (H) 0.0 - 100.0 pg/mL    Comment: Performed  at [facility], [address] [place], [place] [address]  Troponin I (High Sensitivity)     Status: Abnormal   Collection Time: 04/27/23  8:25 PM  Result Value Ref Range   Troponin I (High Sensitivity) 25 (H) <18 ng/L    Comment: (NOTE) Elevated high sensitivity troponin I (hsTnI) values and significant  changes across serial measurements may suggest ACS but many other  chronic and acute conditions are known to  elevate hsTnI results.  Refer to the "Links" section for chest pain algorithms and additional  guidance. Performed at [facility], [address] [place], [place] [address]    DG Chest Portable 1 View  Result Date: 04/27/2023 CLINICAL DATA:  Shortness of breath EXAM: PORTABLE CHEST 1 VIEW COMPARISON:  03/09/2022 FINDINGS: Cardiomegaly with vascular congestion and probable small pleural effusions. Mild diffuse hazy pulmonary density, probably edema. No pneumothorax IMPRESSION: Cardiomegaly with vascular congestion and probable small pleural effusions. Mild diffuse hazy pulmonary density, probably edema. Electronically Signed   By: Jasmine Pang M.D.   On: 04/27/2023 19:54    Pending Labs Unresulted Labs (From admission, onward)     Start     Ordered   04/27/23 2041  Blood gas, arterial  ONCE - STAT,   STAT        04/27/23 2040            Vitals/Pain Today's Vitals   04/27/23 1930 04/27/23 2054 04/27/23 2055 04/27/23 2108  BP: 130/61   (!) 158/75  Pulse: 91   85  Resp: 18   20  SpO2: 100%   100%  Weight:   93.1 kg   Height:   4\' 10"  (1.473 m)   PainSc:  0-No pain      Isolation Precautions No active isolations  Medications Medications  nitroGLYCERIN 50 mg in dextrose 5 % 250 mL (0.2 mg/mL) infusion (0 mcg/min Intravenous Stopped 04/27/23 1946)  furosemide (LASIX) injection 40 mg (40 mg Intravenous Given 04/27/23 2021)    Mobility walks     Focused Assessments Cardiac Assessment Handoff:  Cardiac Rhythm: Normal  sinus rhythm Lab Results  Component Value Date   CKTOTAL 326 (H) 08/24/2016   No results found for: "DDIMER" Does the Patient currently have chest pain? No    R Recommendations: See Admitting Provider Note  Report given to:   Additional Notes: currently on bipap, purewick, no pain

## 2023-04-27 NOTE — ED Notes (Signed)
Pt placed on 4L NC in triage.

## 2023-04-27 NOTE — ED Notes (Signed)
Patient currently on Bipap 10/5 40%, tolerating well at this time. RR 24, SpO2 100%.

## 2023-04-27 NOTE — Progress Notes (Addendum)
Plan of Care Note for accepted transfer  Patient: [name]              GNF:[id number]  DOA: 04/27/2023     Facility requesting transfer: [facility] emergency department Requesting Provider: Dr. Doran Durand  Reason for transfer: CHF exacerbation and acute hypoxic respiratory failure  Facility course: 67 year old female history of DM type II, CKD stage IIIa, severe aortic stenosis and essential hypertension presented to emergency department for complaint of shortness of breath.  Patient unable to speak full sentence.  O2 sat 85% on 4 L oxygen.  Patient has been placed on BiPAP and O2 sat has been improved to 100% - At presentation to ED patient found hypertensive 223/93, tachycardic 125 which has been improved to 91, respiratory improved 26-18 and O2 sat 100% on BiPAP. Elevated BNP 134. Troponin 18.  EKG unremarkable. BMP unremarkable. CBC showing mild leukocytosis 11.2 hemoglobin dropped 9.8 from 10.4.  Chest x-ray showing Cardiomegaly with vascular congestion and probable small pleural effusions. Mild diffuse hazy pulmonary density, probably edema.   In the ED patient has been started on nitro drip and lasix 40 mg. Starting the nitro drip and BiPAP patient blood pressure has been significantly improved and nitro drip has been stopped 2 hours ago. Patient's blood pressure has been improved to 130/61.  Hospitalist has been contacted for further evaluation management of acute hyper respiratory failure in the context of acute CHF exacerbation.  Requested emergency medicine physician to consult cardiology for further recommendation as well.  Update, Dr. Doran Durand consulted cardiology who recommended once patient will be transferred to [facility] need to inform cardiology for formal consult..  Plan of care: The patient is accepted for admission for inpatient status to Progressive unit, at [facility].  Check [email or web address] for on-call coverage.  [facility] will assume  care on arrival to accepting facility. Until arrival, medical decision making responsibilities remain with the EDP.  However, [facility] available 24/7 for questions and assistance.   Nursing staff please page [facility] Admits and Consults [phone]) as soon as the patient arrives to the hospital.    Author: Tereasa Coop, MD  04/27/2023  [facility]

## 2023-04-27 NOTE — ED Provider Notes (Signed)
[place] EMERGENCY DEPARTMENT AT [facility] Provider Note   CSN: [id number] Arrival date & time: 04/27/23  1829     History Chief Complaint  Patient presents with   Shortness of Breath    HPI [name] is a 67 y.o. female presenting for chief complaint of shortness of breath.  History of aortic stenosis, hypertension, hyperlipidemia, diabetes.  Per family, she has been being evaluated by her cardiologist in the outpatient setting for worsening dyspnea on exertion over the last week but today from 12-4 became acutely cyanotic and unable to communicate. Brought in on 4 L nasal cannula still satting 85%.  I was called emergently to bedside to evaluate..   Patient's recorded medical, surgical, social, medication list and allergies were reviewed in the Snapshot window as part of the initial history.   Review of Systems   Review of Systems  Constitutional:  Negative for chills and fever.  HENT:  Negative for ear pain and sore throat.   Eyes:  Negative for pain and visual disturbance.  Respiratory:  Positive for cough and shortness of breath.   Cardiovascular:  Positive for chest pain. Negative for palpitations.  Gastrointestinal:  Negative for abdominal pain and vomiting.  Genitourinary:  Negative for dysuria and hematuria.  Musculoskeletal:  Negative for arthralgias and back pain.  Skin:  Negative for color change and rash.  Neurological:  Negative for seizures and syncope.  All other systems reviewed and are negative.   Physical Exam Updated Vital Signs BP (!) 170/77   Pulse 87   Resp 20   Ht 4\' 10"  (1.473 m)   Wt 93.1 kg   SpO2 100%   BMI 42.89 kg/m  Physical Exam Vitals and nursing note reviewed.  Constitutional:      General: She is not in acute distress.    Appearance: She is well-developed.  HENT:     Head: Normocephalic and atraumatic.  Eyes:     Conjunctiva/sclera: Conjunctivae normal.  Cardiovascular:     Rate and Rhythm: Normal rate and  regular rhythm.     Heart sounds: No murmur heard. Pulmonary:     Effort: Tachypnea, accessory muscle usage and respiratory distress present.     Breath sounds: Normal breath sounds.  Abdominal:     Palpations: Abdomen is soft.     Tenderness: There is no abdominal tenderness.  Musculoskeletal:        General: No swelling.     Cervical back: Neck supple.  Skin:    General: Skin is warm and dry.     Capillary Refill: Capillary refill takes less than 2 seconds.  Neurological:     Mental Status: She is alert.  Psychiatric:        Mood and Affect: Mood normal.      ED Course/ Medical Decision Making/ A&P    Procedures .Critical Care  Performed by: Glyn Ade, MD Authorized by: Glyn Ade, MD   Critical care provider statement:    Critical care time (minutes):  95   Critical care was necessary to treat or prevent imminent or life-threatening deterioration of the following conditions:  Circulatory failure and respiratory failure   Critical care was time spent personally by me on the following activities:  Development of treatment plan with patient or surrogate, discussions with consultants, evaluation of patient's response to treatment, examination of patient, ordering and review of laboratory studies, ordering and review of radiographic studies, ordering and performing treatments and interventions, pulse oximetry, re-evaluation of patient's condition  and review of old charts   Care discussed with: admitting provider      Medications Ordered in ED Medications  nitroGLYCERIN 50 mg in dextrose 5 % 250 mL (0.2 mg/mL) infusion (0 mcg/min Intravenous Stopped 04/27/23 1946)  amLODipine (NORVASC) tablet 10 mg (has no administration in time range)  carvedilol (COREG) tablet 25 mg (has no administration in time range)  furosemide (LASIX) injection 40 mg (40 mg Intravenous Given 04/27/23 2021)    Medical Decision Making:    [name] is a 67 y.o. female who  presented to the ED today with acute respiratory distress detailed above.     Additional history discussed with patient's family/caregivers.  Patient placed on continuous vitals and telemetry monitoring while in ED which was reviewed periodically.   Complete initial physical exam performed, notably the patient  was satting 85% on a new 4 L oxygen addition.      Reviewed and confirmed nursing documentation for past medical history, family history, social history.    Initial Assessment:   With the patient's presentation of shortness of breath, most likely diagnosis is acute heart failure with acute exacerbation, hypertensive crisis, acute pulmonary edema from alternative pathology. Other diagnoses were considered including (but not limited to) pulmonary embolism, ACS, pericardial disease. These are considered less likely due to history of present illness and physical exam findings.   This is most consistent with an acute life/limb threatening illness complicated by underlying chronic conditions.  Initial Plan:  Screening labs including CBC and Metabolic panel to evaluate for infectious or metabolic etiology of disease.  Urinalysis with reflex culture ordered to evaluate for UTI or relevant urologic/nephrologic pathology.  CXR to evaluate for structural/infectious intrathoracic pathology.  Troponin/BNP/EKG to evaluate for cardiac pathology. Objective evaluation as below reviewed with plan for close reassessment  Initial Study Results:   Laboratory  Positive BNP EKG EKG was reviewed independently. Rate, rhythm, axis, intervals all examined and without medically relevant abnormality. ST segments without concerns for elevations.    Radiology  All images reviewed independently. Agree with radiology report at this time.   DG Chest Portable 1 View  Result Date: 04/27/2023 CLINICAL DATA:  Shortness of breath EXAM: PORTABLE CHEST 1 VIEW COMPARISON:  03/09/2022 FINDINGS: Cardiomegaly with vascular  congestion and probable small pleural effusions. Mild diffuse hazy pulmonary density, probably edema. No pneumothorax IMPRESSION: Cardiomegaly with vascular congestion and probable small pleural effusions. Mild diffuse hazy pulmonary density, probably edema. Electronically Signed   By: Jasmine Pang M.D.   On: 04/27/2023 19:54     Consults:  Case discussed with hospitalist and cardiology per hospitalist request.   Reassessment and Plan:   Patient stabilized on BiPAP and nitroglycerin drip.  Home medications ordered.  Agree with admission to stepdown unit for further care and management.  Started diuresis on Lasix due to clear evidence of volume overload on exam.   Disposition:   Based on the above findings, I believe this patient is stable for admission.    Patient/family educated about specific findings on our evaluation and explained exact reasons for admission.  Patient/family educated about clinical situation and time was allowed to answer questions.   Admission team communicated with and agreed with need for admission. Patient admitted. Patient  ready to move at this time.     Emergency Department Medication Summary:   Medications  nitroGLYCERIN 50 mg in dextrose 5 % 250 mL (0.2 mg/mL) infusion (0 mcg/min Intravenous Stopped 04/27/23 1946)  amLODipine (NORVASC) tablet 10 mg (has  no administration in time range)  carvedilol (COREG) tablet 25 mg (has no administration in time range)  furosemide (LASIX) injection 40 mg (40 mg Intravenous Given 04/27/23 2021)         Clinical Impression:  1. Acute hypoxic respiratory failure (HCC)      Admit   Final Clinical Impression(s) / ED Diagnoses Final diagnoses:  Acute hypoxic respiratory failure [facility])    Rx / DC Orders ED Discharge Orders     None         Glyn Ade, MD 04/27/23 2258

## 2023-04-27 NOTE — ED Notes (Signed)
Unsuccessful ABG attempt x 2

## 2023-04-28 ENCOUNTER — Inpatient Hospital Stay (HOSPITAL_COMMUNITY): Payer: Medicare HMO

## 2023-04-28 DIAGNOSIS — I130 Hypertensive heart and chronic kidney disease with heart failure and stage 1 through stage 4 chronic kidney disease, or unspecified chronic kidney disease: Secondary | ICD-10-CM | POA: Diagnosis present

## 2023-04-28 DIAGNOSIS — N179 Acute kidney failure, unspecified: Secondary | ICD-10-CM

## 2023-04-28 DIAGNOSIS — J9601 Acute respiratory failure with hypoxia: Secondary | ICD-10-CM | POA: Diagnosis present

## 2023-04-28 DIAGNOSIS — X58XXXA Exposure to other specified factors, initial encounter: Secondary | ICD-10-CM | POA: Diagnosis not present

## 2023-04-28 DIAGNOSIS — I5031 Acute diastolic (congestive) heart failure: Secondary | ICD-10-CM | POA: Diagnosis not present

## 2023-04-28 DIAGNOSIS — I5033 Acute on chronic diastolic (congestive) heart failure: Secondary | ICD-10-CM | POA: Diagnosis present

## 2023-04-28 DIAGNOSIS — Z7984 Long term (current) use of oral hypoglycemic drugs: Secondary | ICD-10-CM | POA: Diagnosis not present

## 2023-04-28 DIAGNOSIS — Z881 Allergy status to other antibiotic agents status: Secondary | ICD-10-CM | POA: Diagnosis not present

## 2023-04-28 DIAGNOSIS — I509 Heart failure, unspecified: Secondary | ICD-10-CM | POA: Diagnosis present

## 2023-04-28 DIAGNOSIS — F419 Anxiety disorder, unspecified: Secondary | ICD-10-CM | POA: Diagnosis present

## 2023-04-28 DIAGNOSIS — T8141XA Infection following a procedure, superficial incisional surgical site, initial encounter: Secondary | ICD-10-CM | POA: Diagnosis present

## 2023-04-28 DIAGNOSIS — D631 Anemia in chronic kidney disease: Secondary | ICD-10-CM | POA: Diagnosis present

## 2023-04-28 DIAGNOSIS — I161 Hypertensive emergency: Secondary | ICD-10-CM | POA: Diagnosis present

## 2023-04-28 DIAGNOSIS — E1122 Type 2 diabetes mellitus with diabetic chronic kidney disease: Secondary | ICD-10-CM | POA: Diagnosis present

## 2023-04-28 DIAGNOSIS — E7800 Pure hypercholesterolemia, unspecified: Secondary | ICD-10-CM | POA: Diagnosis present

## 2023-04-28 DIAGNOSIS — Z79899 Other long term (current) drug therapy: Secondary | ICD-10-CM | POA: Diagnosis not present

## 2023-04-28 DIAGNOSIS — Z981 Arthrodesis status: Secondary | ICD-10-CM | POA: Diagnosis not present

## 2023-04-28 DIAGNOSIS — I5021 Acute systolic (congestive) heart failure: Secondary | ICD-10-CM | POA: Diagnosis not present

## 2023-04-28 DIAGNOSIS — I350 Nonrheumatic aortic (valve) stenosis: Secondary | ICD-10-CM | POA: Diagnosis present

## 2023-04-28 DIAGNOSIS — Z792 Long term (current) use of antibiotics: Secondary | ICD-10-CM | POA: Diagnosis not present

## 2023-04-28 DIAGNOSIS — M1612 Unilateral primary osteoarthritis, left hip: Secondary | ICD-10-CM | POA: Diagnosis present

## 2023-04-28 DIAGNOSIS — I5023 Acute on chronic systolic (congestive) heart failure: Secondary | ICD-10-CM | POA: Diagnosis not present

## 2023-04-28 DIAGNOSIS — Z882 Allergy status to sulfonamides status: Secondary | ICD-10-CM | POA: Diagnosis not present

## 2023-04-28 DIAGNOSIS — Z6841 Body Mass Index (BMI) 40.0 and over, adult: Secondary | ICD-10-CM | POA: Diagnosis not present

## 2023-04-28 DIAGNOSIS — K219 Gastro-esophageal reflux disease without esophagitis: Secondary | ICD-10-CM | POA: Diagnosis present

## 2023-04-28 DIAGNOSIS — Z888 Allergy status to other drugs, medicaments and biological substances status: Secondary | ICD-10-CM | POA: Diagnosis not present

## 2023-04-28 DIAGNOSIS — Z8249 Family history of ischemic heart disease and other diseases of the circulatory system: Secondary | ICD-10-CM | POA: Diagnosis not present

## 2023-04-28 DIAGNOSIS — N1831 Chronic kidney disease, stage 3a: Secondary | ICD-10-CM | POA: Diagnosis present

## 2023-04-28 LAB — CBC WITH DIFFERENTIAL/PLATELET
Abs Immature Granulocytes: 0.34 10*3/uL — ABNORMAL HIGH (ref 0.00–0.07)
Basophils Absolute: 0.1 10*3/uL (ref 0.0–0.1)
Basophils Relative: 1 %
Eosinophils Absolute: 0.3 10*3/uL (ref 0.0–0.5)
Eosinophils Relative: 3 %
HCT: 30.3 % — ABNORMAL LOW (ref 36.0–46.0)
Hemoglobin: 9 g/dL — ABNORMAL LOW (ref 12.0–15.0)
Immature Granulocytes: 4 %
Lymphocytes Relative: 25 %
Lymphs Abs: 2.1 10*3/uL (ref 0.7–4.0)
MCH: 23.6 pg — ABNORMAL LOW (ref 26.0–34.0)
MCHC: 29.7 g/dL — ABNORMAL LOW (ref 30.0–36.0)
MCV: 79.5 fL — ABNORMAL LOW (ref 80.0–100.0)
Monocytes Absolute: 0.6 10*3/uL (ref 0.1–1.0)
Monocytes Relative: 7 %
Neutro Abs: 4.9 10*3/uL (ref 1.7–7.7)
Neutrophils Relative %: 60 %
Platelets: 322 10*3/uL (ref 150–400)
RBC: 3.81 MIL/uL — ABNORMAL LOW (ref 3.87–5.11)
RDW: 18.6 % — ABNORMAL HIGH (ref 11.5–15.5)
WBC: 8.2 10*3/uL (ref 4.0–10.5)
nRBC: 0 % (ref 0.0–0.2)

## 2023-04-28 LAB — ECHOCARDIOGRAM COMPLETE
AR max vel: 0.77 cm2
AV Area VTI: 0.93 cm2
AV Area mean vel: 0.85 cm2
AV Mean grad: 30.6 mm[Hg]
AV Peak grad: 69 mm[Hg]
Ao pk vel: 4.15 m/s
Area-P 1/2: 3.6 cm2
Calc EF: 72.5 %
Height: 58 in
MV VTI: 2.12 cm2
P 1/2 time: 432 ms
S' Lateral: 1.8 cm
Single Plane A2C EF: 68.5 %
Single Plane A4C EF: 72.6 %
Weight: 3164.04 [oz_av]

## 2023-04-28 LAB — TSH: TSH: 4.11 u[IU]/mL (ref 0.350–4.500)

## 2023-04-28 LAB — RESPIRATORY PANEL BY PCR

## 2023-04-28 LAB — HEMOGLOBIN A1C
Hgb A1c MFr Bld: 6.2 % — ABNORMAL HIGH (ref 4.8–5.6)
Mean Plasma Glucose: 131.24 mg/dL

## 2023-04-28 LAB — RETICULOCYTES
Immature Retic Fract: 32 % — ABNORMAL HIGH (ref 2.3–15.9)
RBC.: 3.68 MIL/uL — ABNORMAL LOW (ref 3.87–5.11)
Retic Count, Absolute: 80.2 10*3/uL (ref 19.0–186.0)
Retic Ct Pct: 2.2 % (ref 0.4–3.1)

## 2023-04-28 LAB — GLUCOSE, CAPILLARY
Glucose-Capillary: 104 mg/dL — ABNORMAL HIGH (ref 70–99)
Glucose-Capillary: 107 mg/dL — ABNORMAL HIGH (ref 70–99)
Glucose-Capillary: 119 mg/dL — ABNORMAL HIGH (ref 70–99)
Glucose-Capillary: 116 mg/dL — ABNORMAL HIGH (ref 70–99)
Glucose-Capillary: 120 mg/dL — ABNORMAL HIGH (ref 70–99)

## 2023-04-28 LAB — COMPREHENSIVE METABOLIC PANEL
ALT: 18 U/L (ref 0–44)
AST: 17 U/L (ref 15–41)
Albumin: 4.1 g/dL (ref 3.5–5.0)
Alkaline Phosphatase: 67 U/L (ref 38–126)
Anion gap: 10 (ref 5–15)
BUN: 21 mg/dL (ref 8–23)
CO2: 28 mmol/L (ref 22–32)
Calcium: 9.6 mg/dL (ref 8.9–10.3)
Chloride: 102 mmol/L (ref 98–111)
Creatinine, Ser: 1.3 mg/dL — ABNORMAL HIGH (ref 0.44–1.00)
GFR, Estimated: 46 mL/min — ABNORMAL LOW (ref >60)
Glucose, Bld: 121 mg/dL — ABNORMAL HIGH (ref 70–99)
Potassium: 3.9 mmol/L (ref 3.5–5.1)
Sodium: 140 mmol/L (ref 135–145)
Total Bilirubin: 0.5 mg/dL (ref <1.2)
Total Protein: 7.4 g/dL (ref 6.5–8.1)

## 2023-04-28 LAB — IRON AND TIBC
Iron: 29 ug/dL (ref 28–170)
Saturation Ratios: 8 % — ABNORMAL LOW (ref 10.4–31.8)
TIBC: 389 ug/dL (ref 250–450)
UIBC: 360 ug/dL

## 2023-04-28 LAB — PRO B NATRIURETIC PEPTIDE: NT-Pro BNP: 315 pg/mL — ABNORMAL HIGH (ref 0–301)

## 2023-04-28 LAB — VITAMIN B12: Vitamin B-12: 282 pg/mL (ref 180–914)

## 2023-04-28 LAB — MAGNESIUM: Magnesium: 2.1 mg/dL (ref 1.7–2.4)

## 2023-04-28 LAB — BRAIN NATRIURETIC PEPTIDE: B Natriuretic Peptide: 162.1 pg/mL — ABNORMAL HIGH (ref 0.0–100.0)

## 2023-04-28 LAB — LDL CHOLESTEROL, DIRECT: LDL Direct: 106 mg/dL — ABNORMAL HIGH (ref 0–99)

## 2023-04-28 LAB — FOLATE: Folate: 15 ng/mL (ref >5.9)

## 2023-04-28 LAB — HIV ANTIBODY (ROUTINE TESTING W REFLEX): HIV Screen 4th Generation wRfx: NONREACTIVE

## 2023-04-28 LAB — FERRITIN: Ferritin: 35 ng/mL (ref 11–307)

## 2023-04-28 MED ORDER — GLUCAGON HCL RDNA (DIAGNOSTIC) 1 MG IJ SOLR: 1.0000 mg | INTRAMUSCULAR | Status: DC | PRN

## 2023-04-28 MED ORDER — ONDANSETRON HCL 4 MG/2ML IJ SOLN: 4.0000 mg | Freq: Four times a day (QID) | INTRAMUSCULAR | Status: DC | PRN

## 2023-04-28 MED ORDER — IPRATROPIUM-ALBUTEROL 0.5-2.5 (3) MG/3ML IN SOLN: 3.0000 mL | RESPIRATORY_TRACT | Status: DC | PRN

## 2023-04-28 MED ORDER — ROSUVASTATIN CALCIUM 5 MG PO TABS
10.0000 mg | ORAL_TABLET | ORAL | Status: DC
  Administered 2023-04-28 – 2023-04-30 (×2): 10 mg via ORAL
  Filled 2023-04-28 (×4): qty 2

## 2023-04-28 MED ORDER — ALBUTEROL SULFATE (2.5 MG/3ML) 0.083% IN NEBU: 2.5000 mg | INHALATION_SOLUTION | RESPIRATORY_TRACT | Status: DC | PRN

## 2023-04-28 MED ORDER — HEPARIN SODIUM (PORCINE) 5000 UNIT/ML IJ SOLN
5000.0000 [IU] | Freq: Three times a day (TID) | INTRAMUSCULAR | Status: DC
  Administered 2023-04-28 – 2023-04-30 (×7): 5000 [IU] via SUBCUTANEOUS
  Filled 2023-04-28 (×7): qty 1

## 2023-04-28 MED ORDER — GABAPENTIN 300 MG PO CAPS
600.0000 mg | ORAL_CAPSULE | Freq: Three times a day (TID) | ORAL | Status: DC
  Administered 2023-04-28 – 2023-04-29 (×3): 600 mg via ORAL
  Filled 2023-04-28 (×3): qty 2

## 2023-04-28 MED ORDER — SENNOSIDES-DOCUSATE SODIUM 8.6-50 MG PO TABS: 1.0000 | ORAL_TABLET | Freq: Every evening | ORAL | Status: DC | PRN

## 2023-04-28 MED ORDER — GUAIFENESIN 100 MG/5ML PO LIQD: 5.0000 mL | ORAL | Status: DC | PRN

## 2023-04-28 MED ORDER — DOCUSATE SODIUM 100 MG PO CAPS
100.0000 mg | ORAL_CAPSULE | Freq: Two times a day (BID) | ORAL | Status: DC
  Administered 2023-04-28 – 2023-04-30 (×5): 100 mg via ORAL
  Filled 2023-04-28 (×5): qty 1

## 2023-04-28 MED ORDER — ONDANSETRON HCL 4 MG PO TABS: 4.0000 mg | ORAL_TABLET | Freq: Four times a day (QID) | ORAL | Status: DC | PRN

## 2023-04-28 MED ORDER — TRAZODONE HCL 50 MG PO TABS
50.0000 mg | ORAL_TABLET | Freq: Every evening | ORAL | Status: DC | PRN
  Administered 2023-04-28 – 2023-04-29 (×2): 50 mg via ORAL
  Filled 2023-04-28 (×2): qty 1

## 2023-04-28 MED ORDER — PANTOPRAZOLE SODIUM 40 MG PO TBEC
40.0000 mg | DELAYED_RELEASE_TABLET | Freq: Every day | ORAL | Status: DC
  Administered 2023-04-28 – 2023-04-30 (×3): 40 mg via ORAL
  Filled 2023-04-28 (×3): qty 1

## 2023-04-28 MED ORDER — HYDRALAZINE HCL 20 MG/ML IJ SOLN: 10.0000 mg | INTRAMUSCULAR | Status: DC | PRN

## 2023-04-28 MED ORDER — FUROSEMIDE 20 MG PO TABS: 10.0000 mg | ORAL_TABLET | Freq: Every day | ORAL | 3 refills | Status: DC | PRN

## 2023-04-28 MED ORDER — ACETAMINOPHEN 325 MG PO TABS
650.0000 mg | ORAL_TABLET | Freq: Four times a day (QID) | ORAL | Status: DC | PRN
  Administered 2023-04-28 – 2023-04-29 (×2): 650 mg via ORAL
  Filled 2023-04-28 (×2): qty 2

## 2023-04-28 MED ORDER — METOPROLOL TARTRATE 5 MG/5ML IV SOLN: 5.0000 mg | INTRAVENOUS | Status: DC | PRN

## 2023-04-28 MED ORDER — ACETAMINOPHEN 650 MG RE SUPP: 650.0000 mg | Freq: Four times a day (QID) | RECTAL | Status: DC | PRN

## 2023-04-28 MED ORDER — FUROSEMIDE 10 MG/ML IJ SOLN
40.0000 mg | Freq: Two times a day (BID) | INTRAMUSCULAR | Status: DC
  Administered 2023-04-28 – 2023-04-30 (×5): 40 mg via INTRAVENOUS
  Filled 2023-04-28 (×5): qty 4

## 2023-04-28 MED ORDER — INSULIN ASPART 100 UNIT/ML IJ SOLN: 0.0000 [IU] | Freq: Three times a day (TID) | INTRAMUSCULAR | Status: DC

## 2023-04-28 MED ORDER — SULFAMETHOXAZOLE-TRIMETHOPRIM 400-80 MG PO TABS
1.0000 | ORAL_TABLET | Freq: Two times a day (BID) | ORAL | Status: DC
  Administered 2023-04-28 – 2023-04-30 (×5): 1 via ORAL
  Filled 2023-04-28 (×6): qty 1

## 2023-04-28 NOTE — ED Notes (Signed)
Report given to [facility]. 

## 2023-04-28 NOTE — Progress Notes (Signed)
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  Upon arrival noted to be hypoxic requiring 4 L nasal cannula and placed on BiPAP.  Chest x-ray showed signs of volume overload with vascular congestion.  She was also in hypertensive emergency with systolics greater than 200 requiring nitroglycerin drip.   Assessment & Plan:  Principal Problem:   Acute exacerbation of CHF (congestive heart failure) (HCC) Active Problems:   Acute CHF (congestive heart failure) (HCC)   Acute hypoxic respiratory failure requiring BiPAP Acute congestive heart failure with preserved EF, 75% Severe aortic stenosis -Patient follows outpatient with [facility] cardiology.  Recently underwent left heart catheterization which showed severe AS but widely patent coronary arteries with minimal irregularities.  Given severe AS could be leading to her CHF exacerbation, cardiology team consulted and following.  Hypertensive emergency, improved -Initially placed on nitroglycerin drip and diuretics.  Resuming home medications at this time: Norvasc, Coreg.  Cardiology team will help with medication management   CKD stage III A -Creatinine currently at baseline of 1.4.  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  Currently on chronic suppressive therapy, Bactrim   Anxiety -no active issues   Anemia of chronic disease Hemoglobin stable around 9   GERD -PPI    HLD -continue on statin    Asthma -Not an active exacerbation.  As needed  bronchodilators   Obesity Morbid obesity  Complicates overall prognosis and care Lifestyle modification and exercise has been discussed with patient in detail   DMII  -Slight scale and Accu-Cheks  DVT prophylaxis: heparin injection 5,000 Units Start: 04/28/23 0600 Code Status: Full code Family Communication:   Status is: Inpatient Remains inpatient appropriate because: Continue hospital stay for diuresis    Subjective: Overall feeling better this morning but still having some exertional dyspnea.   Examination:  General exam: Appears calm and comfortable  Respiratory system: Clear to auscultation. Respiratory effort normal. Cardiovascular system: S1 & S2 heard, RRR. No JVD, murmurs, rubs, gallops or clicks. No pedal edema. Gastrointestinal system: Abdomen is nondistended, soft and nontender. No organomegaly or masses felt. Normal bowel sounds heard. Central nervous system: Alert and oriented. No focal neurological deficits. Extremities: Symmetric 5 x 5 power. Skin: No rashes, lesions or ulcers Psychiatry: Judgement and insight appear normal. Mood & affect appropriate.                Diet Orders (From admission, onward)     Start     Ordered   04/28/23 0228  Diet heart healthy/carb modified Room service appropriate? Yes; Fluid consistency: Thin  Diet effective now       Question Answer Comment  Diet-HS Snack? Nothing   Room service appropriate? Yes   Fluid consistency: Thin      04/28/23 0230            Objective: Vitals:   04/28/23 0145 04/28/23 0224 04/28/23 0910 04/28/23 1130  BP:   (!) 141/68 118/61  Pulse:   87 77  Resp:  16  18  Temp: 98.5 F (36.9  C) 98.1 F (36.7 C)  98.1 F (36.7 C)  TempSrc: Oral Oral  Oral  SpO2:   100% 100%  Weight:  89.7 kg    Height:        Intake/Output Summary (Last 24 hours) at 04/28/2023 1222 Last data filed at 04/28/2023 0149 Gross per 24 hour  Intake 1.21 ml  Output 1200 ml  Net -1198.79 ml   Filed  Weights   04/27/23 2055 04/28/23 0224  Weight: 93.1 kg 89.7 kg    Scheduled Meds:  amLODipine  10 mg Oral Daily   carvedilol  25 mg Oral BID   docusate sodium  100 mg Oral BID   furosemide  40 mg Intravenous Q12H   heparin  5,000 Units Subcutaneous Q8H   insulin aspart  0-6 Units Subcutaneous TID WC   pantoprazole  40 mg Oral Daily   rosuvastatin  10 mg Oral QODAY   sulfamethoxazole-trimethoprim  1 tablet Oral BID   Continuous Infusions:  Nutritional status     Body mass index is 41.33 kg/m.  Data Reviewed:   CBC: Recent Labs  Lab 04/26/23 1053 04/27/23 1845 04/28/23 0246  WBC 6.5 11.2* 8.2  NEUTROABS 3,634 6.1 4.9  HGB 9.2* 9.8* 9.0*  HCT 30.4* 32.4* 30.3*  MCV 78.1* 80.2 79.5*  PLT 319 371 322   Basic Metabolic Panel: Recent Labs  Lab 04/26/23 1053 04/27/23 1845 04/28/23 0246  NA 141 140 140  K 4.8 4.1 3.9  CL 103 103 102  CO2 30 26 28   GLUCOSE 117* 143* 121*  BUN 23 25* 21  CREATININE 1.40* 1.34* 1.30*  CALCIUM 9.8 10.4* 9.6  MG  --   --  2.1   GFR: Estimated Creatinine Clearance: 41.1 mL/min (A) (by C-G formula based on SCr of 1.3 mg/dL (H)). Liver Function Tests: Recent Labs  Lab 04/26/23 1053 04/28/23 0246  AST 17 17  ALT 14 18  ALKPHOS  --  67  BILITOT 0.3 0.5  PROT 7.3 7.4  ALBUMIN  --  4.1   No results for input(s): "LIPASE", "AMYLASE" in the last 168 hours. No results for input(s): "AMMONIA" in the last 168 hours. Coagulation Profile: No results for input(s): "INR", "PROTIME" in the last 168 hours. Cardiac Enzymes: No results for input(s): "CKTOTAL", "CKMB", "CKMBINDEX", "TROPONINI" in the last 168 hours. BNP (last 3 results) Recent Labs    04/27/23 1113  PROBNP 315*   HbA1C: Recent Labs    04/28/23 0246  HGBA1C 6.2*   CBG: Recent Labs  Lab 04/28/23 0238 04/28/23 0748 04/28/23 1129  GLUCAP 104* 107* 119*   Lipid Profile: Recent Labs    04/27/23 1114  LDLDIRECT 106*   Thyroid Function Tests: Recent Labs     04/28/23 0246  TSH 4.110   Anemia Panel: Recent Labs    04/28/23 0925  VITAMINB12 282  FOLATE 15.0  FERRITIN 35  TIBC 389  IRON 29  RETICCTPCT 2.2   Sepsis Labs: No results for input(s): "PROCALCITON", "LATICACIDVEN" in the last 168 hours.  Recent Results (from the past 240 hour(s))  Respiratory (~20 pathogens) panel by PCR     Status: None   Collection Time: 04/28/23  2:28 AM   Specimen: Nasopharyngeal Swab; Respiratory  Result Value Ref Range Status   Adenovirus NOT DETECTED NOT DETECTED Final   Coronavirus 229E NOT DETECTED NOT DETECTED Final    Comment: (NOTE) The Coronavirus on the Respiratory Panel, DOES NOT test for the novel  Coronavirus (2019 nCoV)  Coronavirus HKU1 NOT DETECTED NOT DETECTED Final   Coronavirus NL63 NOT DETECTED NOT DETECTED Final   Coronavirus OC43 NOT DETECTED NOT DETECTED Final   Metapneumovirus NOT DETECTED NOT DETECTED Final   Rhinovirus / Enterovirus NOT DETECTED NOT DETECTED Final   Influenza A NOT DETECTED NOT DETECTED Final   Influenza B NOT DETECTED NOT DETECTED Final   Parainfluenza Virus 1 NOT DETECTED NOT DETECTED Final   Parainfluenza Virus 2 NOT DETECTED NOT DETECTED Final   Parainfluenza Virus 3 NOT DETECTED NOT DETECTED Final   Parainfluenza Virus 4 NOT DETECTED NOT DETECTED Final   Respiratory Syncytial Virus NOT DETECTED NOT DETECTED Final   Bordetella pertussis NOT DETECTED NOT DETECTED Final   Bordetella Parapertussis NOT DETECTED NOT DETECTED Final   Chlamydophila pneumoniae NOT DETECTED NOT DETECTED Final   Mycoplasma pneumoniae NOT DETECTED NOT DETECTED Final    Comment: Performed at [facility], [address] [place], [place] [address]         Radiology Studies: ECHOCARDIOGRAM COMPLETE  Result Date: 04/28/2023    ECHOCARDIOGRAM REPORT   Patient Name:   [name] Date of Exam: 04/28/2023 Medical Rec #:  [id number]         Height:       58.0 in Accession #:    [id number]        Weight:       197.8  lb Date of Birth:  May 11, 1956         BSA:          1.812 m Patient Age:    67 years          BP:           141/69 mmHg Patient Gender: F                 HR:           85 bpm. Exam Location:  Inpatient Procedure: 2D Echo, Cardiac Doppler and Color Doppler Indications:    CHF-acute systolic  History:        Patient has prior history of Echocardiogram examinations, most                 recent 03/15/2023. CHF, Signs/Symptoms:Murmur and Dyspnea; Risk                 Factors:Hypertension, Diabetes and Dyslipidemia.  Sonographer:    Vern Claude Referring Phys: [id number] SARA-MAIZ A THOMAS IMPRESSIONS  1. Left ventricular ejection fraction, by estimation, is 60 to 65%. The left ventricle has normal function. The left ventricle has no regional wall motion abnormalities. There is mild concentric left ventricular hypertrophy. Left ventricular diastolic parameters are consistent with Grade II diastolic dysfunction (pseudonormalization).  2. Right ventricular systolic function is normal. The right ventricular size is normal.  3. The mitral valve is normal in structure. Mild mitral valve regurgitation. No evidence of mitral stenosis.  4. The aortic valve is severely calcified but otherwise not well visualized. Aortic valve regurgitation is mild to moderate. Severe aortic valve stenosis with mean gradient of (manually measured) and AVA by VTI less than 1cm2.  5. The inferior vena cava is normal in size with greater than 50% respiratory variability, suggesting right atrial pressure of 3 mmHg. FINDINGS  Left Ventricle: Left ventricular ejection fraction, by estimation, is 60 to 65%. The left ventricle has normal function. The left ventricle has no regional wall motion abnormalities. The left ventricular internal cavity size was normal in size. There  is  mild concentric left ventricular hypertrophy. Left ventricular diastolic parameters are consistent with Grade II diastolic dysfunction (pseudonormalization). Right Ventricle:  The right ventricular size is normal. No increase in right ventricular wall thickness. Right ventricular systolic function is normal. Left Atrium: Left atrial size was normal in size. Right Atrium: Right atrial size was normal in size. Pericardium: There is no evidence of pericardial effusion. Mitral Valve: The mitral valve is normal in structure. Mild mitral valve regurgitation. No evidence of mitral valve stenosis. MV peak gradient, 6.8 mmHg. The mean mitral valve gradient is 3.5 mmHg. Tricuspid Valve: The tricuspid valve is normal in structure. Tricuspid valve regurgitation is trivial. No evidence of tricuspid stenosis. Aortic Valve: The aortic valve is calcified. Aortic valve regurgitation is mild to moderate. Aortic regurgitation PHT measures 432 msec. Severe aortic stenosis is present. Aortic valve mean gradient measures 30.6 mmHg. Aortic valve peak gradient measures  69.0 mmHg. Aortic valve area, by VTI measures 0.93 cm. Pulmonic Valve: The pulmonic valve was normal in structure. Pulmonic valve regurgitation is not visualized. No evidence of pulmonic stenosis. Aorta: The aortic root is normal in size and structure. Venous: The inferior vena cava is normal in size with greater than 50% respiratory variability, suggesting right atrial pressure of 3 mmHg. IAS/Shunts: No atrial level shunt detected by color flow Doppler.  LEFT VENTRICLE PLAX 2D LVIDd:         3.40 cm      Diastology LVIDs:         1.80 cm      LV e' medial:    6.20 cm/s LV PW:         0.90 cm      LV E/e' medial:  20.6 LV IVS:        1.00 cm      LV e' lateral:   5.11 cm/s LVOT diam:     1.80 cm      LV E/e' lateral: 25.0 LV SV:         69 LV SV Index:   38 LVOT Area:     2.54 cm  LV Volumes (MOD) LV vol d, MOD A2C: 92.7 ml LV vol d, MOD A4C: 144.0 ml LV vol s, MOD A2C: 29.2 ml LV vol s, MOD A4C: 39.4 ml LV SV MOD A2C:     63.5 ml LV SV MOD A4C:     144.0 ml LV SV MOD BP:      94.9 ml RIGHT VENTRICLE             IVC RV Basal diam:  3.10 cm      IVC diam: 1.20 cm RV Mid diam:    2.70 cm RV S prime:     14.60 cm/s TAPSE (M-mode): 3.3 cm LEFT ATRIUM             Index        RIGHT ATRIUM          Index LA diam:        3.10 cm 1.71 cm/m   RA Area:     9.52 cm LA Vol (A2C):   34.0 ml 18.76 ml/m  RA Volume:   18.50 ml 10.21 ml/m LA Vol (A4C):   39.5 ml 21.79 ml/m LA Biplane Vol: 39.9 ml 22.02 ml/m  AORTIC VALVE                     PULMONIC VALVE AV Area (Vmax):    0.77 cm  PV Vmax:       1.25 m/s AV Area (Vmean):   0.85 cm      PV Peak grad:  6.3 mmHg AV Area (VTI):     0.93 cm AV Vmax:           415.40 cm/s AV Vmean:          236.600 cm/s AV VTI:            0.747 m AV Peak Grad:      69.0 mmHg AV Mean Grad:      30.6 mmHg LVOT Vmax:         126.00 cm/s LVOT Vmean:        78.800 cm/s LVOT VTI:          0.273 m LVOT/AV VTI ratio: 0.37 AI PHT:            432 msec  AORTA Ao Root diam: 2.40 cm Ao Asc diam:  2.90 cm MITRAL VALVE MV Area (PHT): 3.60 cm     SHUNTS MV Area VTI:   2.12 cm     Systemic VTI:  0.27 m MV Peak grad:  6.8 mmHg     Systemic Diam: 1.80 cm MV Mean grad:  3.5 mmHg MV Vmax:       1.30 m/s MV Vmean:      83.6 cm/s MV Decel Time: 211 msec MV E velocity: 128.00 cm/s MV A velocity: 129.00 cm/s MV E/A ratio:  0.99 Aditya Sabharwal Electronically signed by Dorthula Nettles Signature Date/Time: 04/28/2023/11:29:58 AM    Final    DG Chest Portable 1 View  Result Date: 04/27/2023 CLINICAL DATA:  Shortness of breath EXAM: PORTABLE CHEST 1 VIEW COMPARISON:  03/09/2022 FINDINGS: Cardiomegaly with vascular congestion and probable small pleural effusions. Mild diffuse hazy pulmonary density, probably edema. No pneumothorax IMPRESSION: Cardiomegaly with vascular congestion and probable small pleural effusions. Mild diffuse hazy pulmonary density, probably edema. Electronically Signed   By: Jasmine Pang M.D.   On: 04/27/2023 19:54           LOS: 0 days   Time spent= 35 mins    Miguel Rota, MD [facility]  If 7PM-7AM,  please contact night-coverage  04/28/2023, 12:22 PM

## 2023-04-28 NOTE — Telephone Encounter (Signed)
Called patient with her lab results. Per Dr. Eden Emms,  BNP upper normal good dyspnea still likely from valve can have PRN lasix 10 mg. Patient is currently in ED. Patient stated she was so SOB last night that she could not breathe and ended up in ED after chest xray.  Will send in Lasix 10 mg prn for patient to have once she is home. Will send message to Dr. Eden Emms, Dr. Excell Seltzer and his structural team, so they are aware patient is in ED.

## 2023-04-28 NOTE — H&P (Signed)
History and Physical    [name] ZOX:[id number] DOB: 15-Nov-1955 DOA: 04/27/2023  PCP: [name], MD  Patient coming from: home  I have personally briefly reviewed patient's old medical records in [facility]  Chief Complaint: sob  HPI: [name] is a 67 y.o. 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  Patient on arrival to EDO2 sat 85% on 4 L oxygen. Patient was thereafter  placed on BiPAP and O2 sat has been improved to 100% .  ED Course:  Vitals:BP 223/93 , HR125 - 91, rr 26-18 and O2 sat 100% on BiPAP. Elevated BNP 162 Troponin 18.  EKG unremarkable. BMP unremarkable. CBC showing mild leukocytosis 11.2 hemoglobin dropped 9.8 from 10.4.  hgb 9 around baseline Cr1.3 9 base 1.05 Chest x-ray showing Cardiomegaly with vascular congestion and probable small pleural effusions. Mild diffuse hazy pulmonary density, probably edema.   WJX:BJYNW tachycardia TX nitro drip which was able to be weaned as bp stablized 130/61. lasix 40 mg.  Patient is admitted for treatment  acute CHFpef  Case was discussed with cardiology who will follow patient   Review of Systems: As per HPI otherwise 10 point review of systems negative.   Past Medical History:  Diagnosis Date   Allergy    Anemia    Anxiety    Arthritis    Asthma    BMI 40.0-44.9, adult (HCC) 04/07/2014   DM type 2 (diabetes mellitus, type 2) (HCC)    GERD (gastroesophageal reflux disease)    HTN (hypertension)    Hypercholesterolemia    Osteoarthritis of left hip 04/07/2014   Reflux    Severe aortic stenosis    Spinal headache    with C-Section and with spinal fusion in 2019    Past Surgical History:  Procedure  Laterality Date   CESAREAN SECTION     x3   COLONOSCOPY     LAMINECTOMY  03/2021   LEFT HEART CATH AND CORONARY ANGIOGRAPHY N/A 04/06/2023   Procedure: LEFT HEART CATH AND CORONARY ANGIOGRAPHY;  Surgeon: Tonny Bollman, MD;  Location: [facility] INVASIVE CV LAB;  Service: Cardiovascular;  Laterality: N/A;   SPINAL FUSION  2019   TUBAL LIGATION       reports that she has never smoked. She has never used smokeless tobacco. She reports that she does not drink alcohol and does not use drugs.  Allergies  Allergen Reactions   Crestor [Rosuvastatin] Other (See Comments)    Myalgia    Robaxin [Methocarbamol] Other (See Comments)    Insomnia   Toradol [Ketorolac Tromethamine] Other (See Comments)   Zocor [Simvastatin] Other (See Comments)    Myalgias    Lipitor [Atorvastatin] Other (See Comments)    Myalgia   Sulfa Antibiotics Hives    Family History  Problem Relation Age of Onset   Hypertension Mother    Stroke Mother    Heart disease Father    Stroke Brother    Multiple sclerosis Brother    Multiple sclerosis Sister    Colon cancer Neg Hx    Colon polyps Neg Hx    Esophageal cancer Neg Hx    Rectal cancer Neg Hx    Stomach cancer Neg Hx     Prior to Admission medications   Medication Sig  Start Date End Date Taking? Authorizing Provider  acetaminophen (TYLENOL) 500 MG tablet Take 1,000 mg by mouth at bedtime as needed for mild pain or headache.    [provider]  albuterol (PROAIR HFA) 108 (90 BASE) MCG/ACT inhaler Inhale 2 puffs into the lungs every 6 (six) hours as needed. 07/21/13   Carmelina Dane, MD  amLODipine (NORVASC) 10 MG tablet Take 1 tablet (10 mg total) by mouth daily. 11/23/17   Wallis Bamberg, PA-C  ascorbic acid (VITAMIN C) 500 MG tablet Take 500 mg by mouth daily.    [provider]  carvedilol (COREG) 25 MG tablet Take 1 tablet (25 mg total) by mouth 2 (two) times daily. 01/13/23   Sharlene Dory, PA-C  celecoxib (CELEBREX) 200 MG capsule Take 200  mg by mouth 2 (two) times daily.  10/21/17   [provider]  cetirizine (ZYRTEC) 10 MG tablet Take 1 tablet (10 mg total) by mouth daily. 10/09/22   Allwardt, Crist Infante, PA-C  docusate sodium (COLACE) 100 MG capsule Take 100 mg by mouth 2 (two) times daily.    [provider]  fluticasone (FLONASE) 50 MCG/ACT nasal spray Place 2 sprays into both nostrils daily. Patient taking differently: Place 2 sprays into both nostrils daily as needed for rhinitis. 10/09/22   Allwardt, Crist Infante, PA-C  gabapentin (NEURONTIN) 600 MG tablet Take 600 mg by mouth 3 (three) times daily. 12/01/21   [provider]  iron polysaccharides (NIFEREX) 150 MG capsule TAKE 1 CAPSULE (150 MG DOSE) BY MOUTH TWICE A DAY Patient taking differently: Take 150 mg by mouth daily. 12/19/19 04/26/23  Jettie Pagan, NP  losartan-hydrochlorothiazide (HYZAAR) 100-25 MG tablet Take 1 tablet by mouth daily. 12/06/22   Sharlene Dory, PA-C  metFORMIN (GLUCOPHAGE) 1000 MG tablet Take 1 tablet (1,000 mg total) by mouth 2 (two) times daily with a meal. 05/01/21     pantoprazole (PROTONIX) 40 MG tablet Take 40 mg by mouth daily. 12/28/21   [provider]  polyvinyl alcohol (LIQUIFILM TEARS) 1.4 % ophthalmic solution Place 1 drop into both eyes as needed for dry eyes.    [provider]  rosuvastatin (CRESTOR) 10 MG tablet Take 1 tablet (10 mg total) by mouth every other day. 02/03/23   Sharlene Dory, PA-C  Semaglutide, 2 MG/DOSE, (OZEMPIC, 2 MG/DOSE,) 8 MG/3ML SOPN Inject 2 mg into the skin once a week.    [provider]  sulfamethoxazole-trimethoprim (BACTRIM) 400-80 MG tablet TAKE 1 TABLET BY MOUTH TWICE DAILY 03/30/23   Raymondo Band, MD    Physical Exam: Vitals:   04/28/23 0115 04/28/23 0130 04/28/23 0145 04/28/23 0224  BP: (!) 156/70 (!) 141/69    Pulse: 72 74    Resp: 14 18  16   Temp:   98.5 F (36.9 C) 98.1 F (36.7 C)  TempSrc:   Oral Oral  SpO2: 100% 100%    Weight:    89.7 kg   Height:        Constitutional: NAD, calm, comfortable Vitals:   04/28/23 0115 04/28/23 0130 04/28/23 0145 04/28/23 0224  BP: (!) 156/70 (!) 141/69    Pulse: 72 74    Resp: 14 18  16   Temp:   98.5 F (36.9 C) 98.1 F (36.7 C)  TempSrc:   Oral Oral  SpO2: 100% 100%    Weight:    89.7 kg  Height:       Eyes: PERRL, lids and conjunctivae normal ENMT: Mucous  membranes are moist. Posterior pharynx clear of any exudate or lesions.Normal dentition.  Neck: normal, supple, no masses, no thyromegaly Respiratory: clear to auscultation bilaterally, no wheezing, no crackles. Normal respiratory effort. No accessory muscle use.  Cardiovascular: Regular rate and rhythm, no murmurs / rubs / gallops. No extremity edema. 2+ pedal pulses.  Abdomen: no tenderness, no masses palpated. No hepatosplenomegaly. Bowel sounds positive.  Musculoskeletal: no clubbing / cyanosis. No joint deformity upper and lower extremities. Good ROM, no contractures. Normal muscle tone.  Skin: no rashes, lesions, ulcers. No induration Neurologic: CN 2-12 grossly intact. Sensation intact,  MAE x 4  Psychiatric: Normal judgment and insight. Alert and oriented x 3. Normal mood.    Labs on Admission: I have personally reviewed following labs and imaging studies  CBC: Recent Labs  Lab 04/26/23 1053 04/27/23 1845  WBC 6.5 11.2*  NEUTROABS 3,634 6.1  HGB 9.2* 9.8*  HCT 30.4* 32.4*  MCV 78.1* 80.2  PLT 319 371   Basic Metabolic Panel: Recent Labs  Lab 04/26/23 1053 04/27/23 1845  NA 141 140  K 4.8 4.1  CL 103 103  CO2 30 26  GLUCOSE 117* 143*  BUN 23 25*  CREATININE 1.40* 1.34*  CALCIUM 9.8 10.4*   GFR: Estimated Creatinine Clearance: 39.9 mL/min (A) (by C-G formula based on SCr of 1.34 mg/dL (H)). Liver Function Tests: Recent Labs  Lab 04/26/23 1053  AST 17  ALT 14  BILITOT 0.3  PROT 7.3   No results for input(s): "LIPASE", "AMYLASE" in the last 168 hours. No results for input(s): "AMMONIA" in the  last 168 hours. Coagulation Profile: No results for input(s): "INR", "PROTIME" in the last 168 hours. Cardiac Enzymes: No results for input(s): "CKTOTAL", "CKMB", "CKMBINDEX", "TROPONINI" in the last 168 hours. BNP (last 3 results) No results for input(s): "PROBNP" in the last 8760 hours. HbA1C: No results for input(s): "HGBA1C" in the last 72 hours. CBG: No results for input(s): "GLUCAP" in the last 168 hours. Lipid Profile: Recent Labs    04/27/23 1114  LDLDIRECT 106*   Thyroid Function Tests: No results for input(s): "TSH", "T4TOTAL", "FREET4", "T3FREE", "THYROIDAB" in the last 72 hours. Anemia Panel: No results for input(s): "VITAMINB12", "FOLATE", "FERRITIN", "TIBC", "IRON", "RETICCTPCT" in the last 72 hours. Urine analysis:    Component Value Date/Time   COLORURINE YELLOW 02/03/2022 0815   APPEARANCEUR CLEAR 02/03/2022 0815   LABSPEC 1.013 02/03/2022 0815   PHURINE 5.0 02/03/2022 0815   GLUCOSEU NEGATIVE 02/03/2022 0815   HGBUR MODERATE (A) 02/03/2022 0815   BILIRUBINUR NEGATIVE 02/03/2022 0815   BILIRUBINUR negative 02/28/2017 1709   BILIRUBINUR neg 07/21/2013 1139   KETONESUR NEGATIVE 02/03/2022 0815   PROTEINUR 30 (A) 02/03/2022 0815   UROBILINOGEN 0.2 02/28/2017 1709   NITRITE NEGATIVE 02/03/2022 0815   LEUKOCYTESUR NEGATIVE 02/03/2022 0815    Radiological Exams on Admission: DG Chest Portable 1 View  Result Date: 04/27/2023 CLINICAL DATA:  Shortness of breath EXAM: PORTABLE CHEST 1 VIEW COMPARISON:  03/09/2022 FINDINGS: Cardiomegaly with vascular congestion and probable small pleural effusions. Mild diffuse hazy pulmonary density, probably edema. No pneumothorax IMPRESSION: Cardiomegaly with vascular congestion and probable small pleural effusions. Mild diffuse hazy pulmonary density, probably edema. Electronically Signed   By: Jasmine Pang M.D.   On: 04/27/2023 19:54    EKG: Independently reviewed.see above  Assessment/Plan Acute CHF with associated  hypoxic respiratory failure -in setting of diastolic dysfunction and severe Aortic valve stenosis  -of note patient has planned aortic valve repair 05/2023 with  pre-op 05/05/23 -in ed initially treated with bipap  but was able to be weaned to [place] -lasix 60 mg iv bid  -ekg without hyperacute st -twave changes  -cardiology fellow consulted , await further cardiology recs   Hypertensive emergency - s/p nitroglycerine drip /lasix  -now weaned or drip with stable bp  -resume home bp regimen   AKI on CKDIII -presumed due to poor flow/insetting of arb use -hold nephrotoxic medications -continue to monitor on diuresis   Hx 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  -continue chronic suppressive abx,  Anxiety -no active issues   Anemia -stable around 9 -will check anemia panel   GERD -ppi   HLD -continue on statin   Asthma -no exacerbation  -prn nebs   Obesity Morbid obesity  Complicates overall prognosis and care Lifestyle modification and exercise has been discussed with patient in detail   DMII  -iss/fs  DVT prophylaxis: heparin Code Status: full/ as discussed per patient wishes in event of cardiac arrest  Family Communication:  none at bedside Disposition Plan: patient  expected to be admitted greater than 2 midnights  Consults called: Cardiology  Admission status: progressive care    Lurline Del MD [facility] If 7PM-7AM, please contact night-coverage [email or web address] Password [email or web address]  04/28/2023, 2:30 AM

## 2023-04-28 NOTE — Hospital Course (Addendum)
Brief Narrative:  67 year old with history of lumbar surgical site OM with hardware infection, chronic suppressive antibiotic, severe AAS with plans of repair in January 2025, anxiety, GERD, HLD, DM 2, CHF with preserved EF, CKD 3 AA, HTN, asthma admitted to the hospital for progressive dyspnea on exertion.  Upon arrival noted to be hypoxic requiring 4 L nasal cannula and placed on BiPAP.  Chest x-ray showed signs of volume overload with vascular congestion.  She was also in hypertensive emergency with systolics greater than 200 requiring nitroglycerin drip. Eventually she was diuresed in the hospital, nitroglycerin was weaned off.  She was transitioned to p.o. Lasix. Medically stable for discharge today.  Assessment & Plan:  Principal Problem:   Acute exacerbation of CHF (congestive heart failure) (HCC) Active Problems:   Acute CHF (congestive heart failure) (HCC)   Acute hypoxic respiratory failure requiring BiPAP Acute congestive heart failure with preserved EF, 75% Severe aortic stenosis -Patient follows outpatient with [facility] cardiology.  Recently underwent left heart catheterization which showed severe AS but widely patent coronary arteries with minimal irregularities.  Given severe AS could be leading to her CHF exacerbation, seen by cardiology team.  She has been diuresed well.  Now will transition to Lasix 40 mg p.o. daily and to be discharged home.  Has outpatient appointment with CT surgery next week.  Cardiology team will also have structural meeting regarding her further care.  Hypertensive emergency, improved -Initially placed on nitroglycerin drip and diuretics.  Resuming home medications at this time: Norvasc, Coreg.  Cardiology team will help with medication management   CKD stage III A -Creatinine currently at baseline of 1.4.  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   Currently on chronic suppressive therapy, Bactrim   Anxiety -no active issues   Anemia of chronic disease Hemoglobin stable around 9   GERD -PPI    HLD -continue on statin    Asthma -Not an active exacerbation.  As needed bronchodilators   Obesity Morbid obesity  Complicates overall prognosis and care Lifestyle modification and exercise has been discussed with patient in detail   DMII  -Slight scale and Accu-Cheks  DVT prophylaxis: heparin injection 5,000 Units Start: 04/28/23 0600 Code Status: Full code Family Communication:   Status is: Inpatient Remains inpatient appropriate because: Discharge today  Subjective: Ambulating well in the hallway.  Wishing to go home  Examination:  General exam: Appears calm and comfortable  Respiratory system: Clear to auscultation. Respiratory effort normal. Cardiovascular system: S1 & S2 heard, RRR. No JVD, murmurs, rubs, gallops or clicks. No pedal edema. Gastrointestinal system: Abdomen is nondistended, soft and nontender. No organomegaly or masses felt. Normal bowel sounds heard. Central nervous system: Alert and oriented. No focal neurological deficits. Extremities: Symmetric 5 x 5 power. Skin: No rashes, lesions or ulcers Psychiatry: Judgement and insight appear normal. Mood & affect appropriate.

## 2023-04-28 NOTE — Telephone Encounter (Signed)
Patient is currently in ED. Patient stated she was so SOB last night that she could not breathe and ended up in ED after chest xray.

## 2023-04-28 NOTE — Plan of Care (Signed)
  Problem: Education: Goal: Knowledge of General Education information will improve Description: Including pain rating scale, medication(s)/side effects and non-pharmacologic comfort measures Outcome: Progressing   Problem: Clinical Measurements: Goal: Respiratory complications will improve Outcome: Progressing   Problem: Coping: Goal: Level of anxiety will decrease Outcome: Progressing   Problem: Elimination: Goal: Will not experience complications related to urinary retention Outcome: Progressing   Problem: Safety: Goal: Ability to remain free from injury will improve Outcome: Progressing   Problem: Activity: Goal: Capacity to carry out activities will improve Outcome: Progressing   Problem: Coping: Goal: Ability to adjust to condition or change in health will improve Outcome: Progressing

## 2023-04-28 NOTE — ED Notes (Signed)
Patient removed from Bipap at this time and placed on 3LNC. Patient tolerating well at this time.

## 2023-04-28 NOTE — Addendum Note (Signed)
Addended by: Virl Axe, Rutha Melgoza L on: 04/28/2023 10:50 AM   Modules accepted: Orders

## 2023-04-28 NOTE — Consult Note (Signed)
CARDIOLOGY CONSULT NOTE       Patient ID: [name] MRN: [id number] DOB/AGE: 65-Jan-1957 65 y.o.  Admit date: 04/27/2023 Referring Physician: Maisie Fus Primary Physician: [name], MD Primary Cardiologist: Eden Emms Reason for Consultation: CHF  Principal Problem:   Acute exacerbation of CHF (congestive heart failure) (HCC) Active Problems:   Acute CHF (congestive heart failure) (HCC)   HPI:  67 y.o. admitted with CHF by hospitalist. She has known severe AS and is being w/u for TAVR vs SAVR. She has poor mobility from chronic back problems with multiple surgeries. She can ambulate some with walker/cane She is obese Has had more dyspnea past week. Lasix was called in from office yesterday but she presented to ER. CXR with CHF. BNP mildy elevated. Good response to lasix. Sats were 85% on presentation Currently laying flat in bed getting TTE. She had cath with Dr Excell Seltzer 04/06/23 showing no obstructive CAD. F/U echo today showed EF 60-65% with severe AS mean gradient 44 mmHg with mild/mod AR. Unfortunately her cardiac CTA done 04/19/23 showed her valve sizes to only a 20 mm Sapien 3 valve. This would likely give her quite a prosthetic/patient mismatch and high post op gradient. He sinus diameter and heights not ideal for 23 Medtronic device either. Needless to say patient prefers TAVR over SAVR. She has an appointment with Dr Leafy Ro 05/05/23 There may be a spot to do TAVR on 12/17 if traditional surgery decided against.   ROS All other systems reviewed and negative except as noted above  Past Medical History:  Diagnosis Date   Allergy    Anemia    Anxiety    Arthritis    Asthma    BMI 40.0-44.9, adult (HCC) 04/07/2014   DM type 2 (diabetes mellitus, type 2) (HCC)    GERD (gastroesophageal reflux disease)    HTN (hypertension)    Hypercholesterolemia    Osteoarthritis of left hip 04/07/2014   Reflux    Severe aortic stenosis    Spinal headache    with C-Section and with  spinal fusion in 2019    Family History  Problem Relation Age of Onset   Hypertension Mother    Stroke Mother    Heart disease Father    Stroke Brother    Multiple sclerosis Brother    Multiple sclerosis Sister    Colon cancer Neg Hx    Colon polyps Neg Hx    Esophageal cancer Neg Hx    Rectal cancer Neg Hx    Stomach cancer Neg Hx     Social History   Socioeconomic History   Marital status: Married    Spouse name: Not on file   Number of children: Not on file   Years of education: Not on file   Highest education level: Not on file  Occupational History   Not on file  Tobacco Use   Smoking status: Never   Smokeless tobacco: Never  Vaping Use   Vaping status: Never Used  Substance and Sexual Activity   Alcohol use: No    Alcohol/week: 0.0 standard drinks of alcohol   Drug use: No   Sexual activity: Yes  Other Topics Concern   Not on file  Social History Narrative   Not on file   Social Determinants of Health   Financial Resource Strain: Low Risk  (04/13/2023)   Received [facility]   Overall Financial Resource Strain (CARDIA)    Difficulty of Paying Living Expenses: Not hard at all  Food  Insecurity: No Food Insecurity (04/28/2023)   Hunger Vital Sign    Worried About Running Out of Food in the Last Year: Never true    Ran Out of Food in the Last Year: Never true  Transportation Needs: No Transportation Needs (04/28/2023)   PRAPARE - Administrator, Civil Service (Medical): No    Lack of Transportation (Non-Medical): No  Physical Activity: Inactive (04/13/2023)   Received from [facility]   Exercise Vital Sign    Days of Exercise per Week: 0 days    Minutes of Exercise per Session: 30 min  Stress: No Stress Concern Present (04/13/2023)   Received from [facility] - Occupational Stress Questionnaire    Feeling of Stress : Only a little  Social Connections: Somewhat Isolated (04/13/2023)    Received from [facility]   Social Network    How would you rate your social network (family, work, friends)?: Restricted participation with some degree of social isolation  Intimate Partner Violence: Not At Risk (04/28/2023)   Humiliation, Afraid, Rape, and Kick questionnaire    Fear of Current or Ex-Partner: No    Emotionally Abused: No    Physically Abused: No    Sexually Abused: No    Past Surgical History:  Procedure Laterality Date   CESAREAN SECTION     x3   COLONOSCOPY     LAMINECTOMY  03/2021   LEFT HEART CATH AND CORONARY ANGIOGRAPHY N/A 04/06/2023   Procedure: LEFT HEART CATH AND CORONARY ANGIOGRAPHY;  Surgeon: Tonny Bollman, MD;  Location: [facility] INVASIVE CV LAB;  Service: Cardiovascular;  Laterality: N/A;   SPINAL FUSION  2019   TUBAL LIGATION        Current Facility-Administered Medications:    acetaminophen (TYLENOL) tablet 650 mg, 650 mg, Oral, Q6H PRN, 650 mg at 04/28/23 1005 **OR** acetaminophen (TYLENOL) suppository 650 mg, 650 mg, Rectal, Q6H PRN, Lurline Del, MD   amLODipine (NORVASC) tablet 10 mg, 10 mg, Oral, Daily, Countryman, Chase, MD, 10 mg at 04/28/23 0906   carvedilol (COREG) tablet 25 mg, 25 mg, Oral, BID, Countryman, Chase, MD, 25 mg at 04/28/23 8295   docusate sodium (COLACE) capsule 100 mg, 100 mg, Oral, BID, Skip Mayer A, MD, 100 mg at 04/28/23 0906   furosemide (LASIX) injection 40 mg, 40 mg, Intravenous, Q12H, Skip Mayer A, MD, 40 mg at 04/28/23 0906   glucagon (human recombinant) (GLUCAGEN) injection 1 mg, 1 mg, Intravenous, PRN, Amin, Ankit C, MD   guaiFENesin (ROBITUSSIN) 100 MG/5ML liquid 5 mL, 5 mL, Oral, Q4H PRN, Amin, Ankit C, MD   heparin injection 5,000 Units, 5,000 Units, Subcutaneous, Q8H, Skip Mayer A, MD, 5,000 Units at 04/28/23 0523   hydrALAZINE (APRESOLINE) injection 10 mg, 10 mg, Intravenous, Q4H PRN, Amin, Ankit C, MD   insulin aspart (novoLOG) injection 0-6 Units, 0-6 Units, Subcutaneous, TID WC,  Thomas, Sara-Maiz A, MD   ipratropium-albuterol (DUONEB) 0.5-2.5 (3) MG/3ML nebulizer solution 3 mL, 3 mL, Nebulization, Q4H PRN, Amin, Ankit C, MD   metoprolol tartrate (LOPRESSOR) injection 5 mg, 5 mg, Intravenous, Q4H PRN, Amin, Ankit C, MD   ondansetron (ZOFRAN) injection 4 mg, 4 mg, Intravenous, Q6H PRN, Amin, Ankit C, MD   ondansetron (ZOFRAN) tablet 4 mg, 4 mg, Oral, Q6H PRN **OR** [DISCONTINUED] ondansetron (ZOFRAN) injection 4 mg, 4 mg, Intravenous, Q6H PRN, Skip Mayer A, MD   pantoprazole (PROTONIX) EC tablet 40 mg, 40 mg, Oral, Daily, Lurline Del, MD, 40  mg at 04/28/23 0906   rosuvastatin (CRESTOR) tablet 10 mg, 10 mg, Oral, Buren Kos A, MD, 10 mg at 04/28/23 1610   senna-docusate (Senokot-S) tablet 1 tablet, 1 tablet, Oral, QHS PRN, Amin, Ankit C, MD   sulfamethoxazole-trimethoprim (BACTRIM) 400-80 MG per tablet 1 tablet, 1 tablet, Oral, BID, Skip Mayer A, MD, 1 tablet at 04/28/23 1005   traZODone (DESYREL) tablet 50 mg, 50 mg, Oral, QHS PRN, Amin, Ankit C, MD  amLODipine  10 mg Oral Daily   carvedilol  25 mg Oral BID   docusate sodium  100 mg Oral BID   furosemide  40 mg Intravenous Q12H   heparin  5,000 Units Subcutaneous Q8H   insulin aspart  0-6 Units Subcutaneous TID WC   pantoprazole  40 mg Oral Daily   rosuvastatin  10 mg Oral QODAY   sulfamethoxazole-trimethoprim  1 tablet Oral BID     Physical Exam: Blood pressure (!) 141/69, pulse 74, temperature 98.1 F (36.7 C), temperature source Oral, resp. rate 16, height 4\' 10"  (1.473 m), weight 89.7 kg, SpO2 100%.    Obese heavy chested black female Basilar crackles AS/AR murmurs  Abdomen benign Plus one edema Palpable pedal pulses   Labs:   Lab Results  Component Value Date   WBC 8.2 04/28/2023   HGB 9.0 (L) 04/28/2023   HCT 30.3 (L) 04/28/2023   MCV 79.5 (L) 04/28/2023   PLT 322 04/28/2023    Recent Labs  Lab 04/28/23 0246  NA 140  K 3.9  CL 102  CO2 28  BUN 21   CREATININE 1.30*  CALCIUM 9.6  PROT 7.4  BILITOT 0.5  ALKPHOS 67  ALT 18  AST 17  GLUCOSE 121*   Lab Results  Component Value Date   CKTOTAL 326 (H) 08/24/2016    Lab Results  Component Value Date   CHOL 153 02/03/2023   CHOL 263 (H) 10/12/2022   CHOL 247 (H) 09/30/2017   Lab Results  Component Value Date   HDL 43 02/03/2023   HDL 46 10/12/2022   HDL 50 09/30/2017   Lab Results  Component Value Date   LDLCALC 86 02/03/2023   LDLCALC 193 (H) 10/12/2022   LDLCALC 161 (H) 09/30/2017   Lab Results  Component Value Date   TRIG 135 02/03/2023   TRIG 131 10/12/2022   TRIG 179 (H) 09/30/2017   Lab Results  Component Value Date   CHOLHDL 3.6 02/03/2023   CHOLHDL 5.7 (H) 10/12/2022   CHOLHDL 4.9 (H) 09/30/2017   Lab Results  Component Value Date   LDLDIRECT 106 (H) 04/27/2023      Radiology: ECHOCARDIOGRAM COMPLETE  Result Date: 04/28/2023    ECHOCARDIOGRAM REPORT   Patient Name:   DANESE BERNABEI Date of Exam: 04/28/2023 Medical Rec #:  [id number]         Height:       58.0 in Accession #:    [id number]        Weight:       197.8 lb Date of Birth:  29-Jul-1955         BSA:          1.812 m Patient Age:    67 years          BP:           141/69 mmHg Patient Gender: F                 HR:  85 bpm. Exam Location:  Inpatient Procedure: 2D Echo, Cardiac Doppler and Color Doppler Indications:    CHF-acute systolic  History:        Patient has prior history of Echocardiogram examinations, most                 recent 03/15/2023. CHF, Signs/Symptoms:Murmur and Dyspnea; Risk                 Factors:Hypertension, Diabetes and Dyslipidemia.  Sonographer:    Vern Claude Referring Phys: [id number] SARA-MAIZ A THOMAS IMPRESSIONS  1. Left ventricular ejection fraction, by estimation, is 60 to 65%. The left ventricle has normal function. The left ventricle has no regional wall motion abnormalities. There is mild concentric left ventricular hypertrophy. Left ventricular diastolic  parameters are consistent with Grade II diastolic dysfunction (pseudonormalization).  2. Right ventricular systolic function is normal. The right ventricular size is normal.  3. The mitral valve is normal in structure. Mild mitral valve regurgitation. No evidence of mitral stenosis.  4. The aortic valve is severely calcified but otherwise not well visualized. Aortic valve regurgitation is mild to moderate. Severe aortic valve stenosis with mean gradient of (manually measured) and AVA by VTI less than 1cm2.  5. The inferior vena cava is normal in size with greater than 50% respiratory variability, suggesting right atrial pressure of 3 mmHg. FINDINGS  Left Ventricle: Left ventricular ejection fraction, by estimation, is 60 to 65%. The left ventricle has normal function. The left ventricle has no regional wall motion abnormalities. The left ventricular internal cavity size was normal in size. There is  mild concentric left ventricular hypertrophy. Left ventricular diastolic parameters are consistent with Grade II diastolic dysfunction (pseudonormalization). Right Ventricle: The right ventricular size is normal. No increase in right ventricular wall thickness. Right ventricular systolic function is normal. Left Atrium: Left atrial size was normal in size. Right Atrium: Right atrial size was normal in size. Pericardium: There is no evidence of pericardial effusion. Mitral Valve: The mitral valve is normal in structure. Mild mitral valve regurgitation. No evidence of mitral valve stenosis. MV peak gradient, 6.8 mmHg. The mean mitral valve gradient is 3.5 mmHg. Tricuspid Valve: The tricuspid valve is normal in structure. Tricuspid valve regurgitation is trivial. No evidence of tricuspid stenosis. Aortic Valve: The aortic valve is calcified. Aortic valve regurgitation is mild to moderate. Aortic regurgitation PHT measures 432 msec. Severe aortic stenosis is present. Aortic valve mean gradient measures 30.6 mmHg.  Aortic valve peak gradient measures  69.0 mmHg. Aortic valve area, by VTI measures 0.93 cm. Pulmonic Valve: The pulmonic valve was normal in structure. Pulmonic valve regurgitation is not visualized. No evidence of pulmonic stenosis. Aorta: The aortic root is normal in size and structure. Venous: The inferior vena cava is normal in size with greater than 50% respiratory variability, suggesting right atrial pressure of 3 mmHg. IAS/Shunts: No atrial level shunt detected by color flow Doppler.  LEFT VENTRICLE PLAX 2D LVIDd:         3.40 cm      Diastology LVIDs:         1.80 cm      LV e' medial:    6.20 cm/s LV PW:         0.90 cm      LV E/e' medial:  20.6 LV IVS:        1.00 cm      LV e' lateral:   5.11 cm/s LVOT diam:     1.80  cm      LV E/e' lateral: 25.0 LV SV:         69 LV SV Index:   38 LVOT Area:     2.54 cm  LV Volumes (MOD) LV vol d, MOD A2C: 92.7 ml LV vol d, MOD A4C: 144.0 ml LV vol s, MOD A2C: 29.2 ml LV vol s, MOD A4C: 39.4 ml LV SV MOD A2C:     63.5 ml LV SV MOD A4C:     144.0 ml LV SV MOD BP:      94.9 ml RIGHT VENTRICLE             IVC RV Basal diam:  3.10 cm     IVC diam: 1.20 cm RV Mid diam:    2.70 cm RV S prime:     14.60 cm/s TAPSE (M-mode): 3.3 cm LEFT ATRIUM             Index        RIGHT ATRIUM          Index LA diam:        3.10 cm 1.71 cm/m   RA Area:     9.52 cm LA Vol (A2C):   34.0 ml 18.76 ml/m  RA Volume:   18.50 ml 10.21 ml/m LA Vol (A4C):   39.5 ml 21.79 ml/m LA Biplane Vol: 39.9 ml 22.02 ml/m  AORTIC VALVE                     PULMONIC VALVE AV Area (Vmax):    0.77 cm      PV Vmax:       1.25 m/s AV Area (Vmean):   0.85 cm      PV Peak grad:  6.3 mmHg AV Area (VTI):     0.93 cm AV Vmax:           415.40 cm/s AV Vmean:          236.600 cm/s AV VTI:            0.747 m AV Peak Grad:      69.0 mmHg AV Mean Grad:      30.6 mmHg LVOT Vmax:         126.00 cm/s LVOT Vmean:        78.800 cm/s LVOT VTI:          0.273 m LVOT/AV VTI ratio: 0.37 AI PHT:            432 msec  AORTA Ao  Root diam: 2.40 cm Ao Asc diam:  2.90 cm MITRAL VALVE MV Area (PHT): 3.60 cm     SHUNTS MV Area VTI:   2.12 cm     Systemic VTI:  0.27 m MV Peak grad:  6.8 mmHg     Systemic Diam: 1.80 cm MV Mean grad:  3.5 mmHg MV Vmax:       1.30 m/s MV Vmean:      83.6 cm/s MV Decel Time: 211 msec MV E velocity: 128.00 cm/s MV A velocity: 129.00 cm/s MV E/A ratio:  0.99 Aditya Sabharwal Electronically signed by Dorthula Nettles Signature Date/Time: 04/28/2023/11:29:58 AM    Final    DG Chest Portable 1 View  Result Date: 04/27/2023 CLINICAL DATA:  Shortness of breath EXAM: PORTABLE CHEST 1 VIEW COMPARISON:  03/09/2022 FINDINGS: Cardiomegaly with vascular congestion and probable small pleural effusions. Mild diffuse hazy pulmonary density, probably edema. No pneumothorax IMPRESSION: Cardiomegaly with vascular congestion and probable small  pleural effusions. Mild diffuse hazy pulmonary density, probably edema. Electronically Signed   By: Jasmine Pang M.D.   On: 04/27/2023 19:54   CT CORONARY MORPH W/CTA COR W/SCORE W/CA W/CM &/OR WO/CM  Addendum Date: 04/19/2023   ADDENDUM REPORT: 04/19/2023 07:50 ADDENDUM: Please refer to separate TAVR dictation from examination performed same date. Electronically Signed   By: Simonne Come M.D.   On: 04/19/2023 07:50   Result Date: 04/19/2023 CLINICAL DATA:  Severe Aortic Stenosis. EXAM: Cardiac TAVR CT TECHNIQUE: A non-contrast, gated CT scan was obtained with axial slices of 3 mm through the heart for aortic valve calcium scoring. A 120 kV retrospective, gated, contrast cardiac scan was obtained. Gantry rotation speed was 250 msecs and collimation was 0.6 mm. Nitroglycerin was not given. The 3D data set was reconstructed in 5% intervals of the 0-95% of the R-R cycle. Systolic and diastolic phases were analyzed on a dedicated workstation using MPR, MIP, and VRT modes. The patient received 100 cc of contrast. FINDINGS: Image quality: Excellent. Noise artifact is: Limited. Valve  Morphology: Tricuspid aortic valve with severely calcified and thickened leaflets. Restricted leaflet motion in systole. Aortic Valve Calcium score: 1014 Aortic annular dimension: Phase assessed: 25% Annular area: 287 mm2 Annular perimeter: 62.1 mm Max diameter: 22.1 mm Min diameter: 16.8 mm Annular and subannular calcification: None. Membranous septum length: 7.5 mm Optimal coplanar projection: LAO 1 CRA 1 Coronary Artery Height above Annulus: Left Main: 8.3 mm Right Coronary: 13.1 mm Sinus of Valsalva Measurements: Non-coronary: 22.7 mm Right-coronary: 25.2 mm Left-coronary: 26.8 mm Sinus of Valsalva Height: Non-coronary: 13.5 mm Right-coronary: 17.1 mm Left-coronary: 14.4 mm Sinotubular Junction: 22.7 mm Ascending Thoracic Aorta: 28.6 mm Coronary Arteries: Normal coronary origin. Right dominance. The study was performed without use of NTG and is insufficient for plaque evaluation. Please refer to recent cardiac catheterization for coronary assessment. 3-vessel coronary calcifications. Cardiac Morphology: Right Atrium: Right atrial size is within normal limits. Right Ventricle: The right ventricular cavity is within normal limits. Left Atrium: Left atrial size is normal in size with no left atrial appendage filling defect. Left Ventricle: The ventricular cavity size is within normal limits. Pulmonary arteries: Normal in size without proximal filling defect. Pulmonary veins: Normal pulmonary venous drainage. Pericardium: Normal thickness with no significant effusion or calcium present. Mitral Valve: The mitral valve is normal structure without significant calcification. Extra-cardiac findings: See attached radiology report for non-cardiac structures. IMPRESSION: 1. Small aortic annulus (287 mm2). 2. Annular measurements support a 20 mm S3, but shallow left main coronary height. Sinus heights are not favorable for a 23 mm Evolut Pro. Structural heart team discussion recommended. 3. No significant annular or  subannular calcifications. 4. Low left main coronary artery height, 8.3 mm. 5. Optimal Fluoroscopic Angle for Delivery: LAO 1 CRA 1 Olean T. Flora Lipps, MD Electronically Signed: By: Lennie Odor M.D. On: 04/18/2023 09:57   CT ANGIO ABDOMEN PELVIS  W & WO CONTRAST  Result Date: 04/18/2023 CLINICAL DATA:  Preoperative examination prior to TAVR EXAM: CT ANGIOGRAPHY CHEST, ABDOMEN AND PELVIS TECHNIQUE: Non-contrast CT of the chest was initially obtained. Multidetector CT imaging through the chest, abdomen and pelvis was performed using the standard protocol during bolus administration of intravenous contrast. Multiplanar reconstructed images and MIPs were obtained and reviewed to evaluate the vascular anatomy. RADIATION DOSE REDUCTION: This exam was performed according to the departmental dose-optimization program which includes automated exposure control, adjustment of the mA and/or kV according to patient size and/or use of iterative reconstruction technique.  CONTRAST:  OMNIPAQUE IOHEXOL 350 MG/ML SOLN COMPARISON:  Lumbar spine CT-02/03/2022; lumbar spine MRI-03/31/2022 FINDINGS: CTA CHEST FINDINGS Vascular Findings: No evidence of thoracic aortic aneurysm or dissection with measurements as follows. Scattered atherosclerotic plaque within the thoracic aorta, not resulting in a hemodynamically significant stenosis. No perivascular stranding. Conventional configuration of the aortic arch. The branch vessels of the aortic arch appear patent throughout their imaged courses. The descending thoracic aorta is of normal caliber and widely patent without a hemodynamically significant narrowing. Cardiomegaly. Coronary artery calcifications. Calcifications involving the aortic valve leaflets. No pericardial effusion. Although this examination was not tailored for the evaluation the pulmonary arteries, there are no discrete filling defects within the central pulmonary arterial tree to suggest central pulmonary  embolism. Normal caliber of the main pulmonary artery. ------------------------------------------------------------- Thoracic aortic measurements: SINOTUBULAR JUNCTION: 23 mm as measured in greatest oblique short axis coronal dimension. PROXIMAL ASCENDING THORACIC AORTA: 29 mm as measured in greatest oblique short axis axial dimension (axial image 54, series 6) at the level of the main pulmonary artery and approximately 29 mm as measured in greatest oblique short axis coronal dimension (coronal image 91, series 8) AORTIC ARCH: 22 mm as measured in greatest oblique short axis sagittal dimension. PROXIMAL DESCENDING THORACIC AORTA: 24 mm as measured in greatest oblique short axis axial dimension at the level of the main pulmonary artery. DISTAL DESCENDING THORACIC AORTA: 22 mm as measured in greatest oblique short axis axial dimension at the level of the diaphragmatic hiatus. Review of the MIP images confirms the above findings. ------------------------------------------------------------- Non-Vascular Findings: Mediastinum/Lymph Nodes: Mildly prominent though non pathologically enlarged bilateral axillary lymph nodes, presumably reactive in etiology. No bulky mediastinal, hilar or axillary lymphadenopathy Lungs/Pleura: Minimal dependent subpleural ground-glass atelectasis. No discrete focal airspace opacities. No air bronchograms. No pleural effusion or pneumothorax. The central pulmonary airways appear widely patent. No discrete pulmonary nodules. Musculoskeletal: Regional soft tissues appear normal. Normal appearance of the thyroid gland. _________________________________________________________ _________________________________________________________ CTA ABDOMEN AND PELVIS FINDINGS VASCULAR Aorta: Scattered atherosclerotic plaque within a normal caliber abdominal aorta, not resulting in a hemodynamically significant narrowing. No evidence of abdominal aortic dissection or perivascular stranding. Celiac: Widely  patent without hemodynamically significant narrowing. Conventional branching pattern. SMA: Widely patent without hemodynamically significant narrowing. Conventional branching pattern. The distal tributaries of the SMA appear widely patent without discrete intraluminal filling defect to suggest distal embolism. Renals: Solitary bilaterally; the bilateral renal arteries are widely patent without a hemodynamically significant narrowing. No vessel irregularity to suggest FMD. IMA: Widely patent without hemodynamically significant narrowing Inflow: The bilateral common, external and internal iliac arteries are of normal caliber and widely patent without hemodynamically significant narrowing. Veins: The IVC and pelvic venous systems appear patent on this arterial phase examination Review of the MIP images confirms the above findings. _________________________________________________________ NON-VASCULAR Evaluation of the abdominal organs is limited to the arterial phase of enhancement. Hepatobiliary: Normal hepatic contour. No discrete enhancing hepatic lesions. There is a 1.6 cm radiopaque gallstone within otherwise normal-appearing gallbladder. No gallbladder wall thickening or pericholecystic stranding. No intra or extrahepatic biliary ductal dilatation. No ascites. Pancreas: Normal appearance of the pancreas. Spleen: Normal early arterial phase appearance of the spleen. Adrenals/Urinary Tract: There is symmetric enhancement of the bilateral kidneys. No evidence of nephrolithiasis on this postcontrast examination. No discrete renal lesions. There is a minimal amount of age and body habitus related perinephric stranding. No urinary obstruction Normal appearance of the bilateral adrenal glands. Normal appearance of the urinary bladder given degree of distention. Stomach/Bowel:  Scattered colonic diverticulosis without evidence superimposed acute diverticulitis. Large colonic stool burden without evidence of enteric  obstruction. Normal appearance of the terminal ileum and the retrocecal appendix. Small hiatal hernia. No pneumoperitoneum, pneumatosis or portal venous gas. Lymphatic: No bulky retroperitoneal, mesenteric, pelvic or inguinal lymph adenopathy. Reproductive: Normal appearance of the pelvic organs. No free fluid in the pelvic cul-de-sac. Other: Subcutaneous edema about the midline of the low back. Musculoskeletal: No acute or aggressive osseous abnormalities. Post L2-S1 paraspinal fusion with intervertebral disc space cage device is seen at L4-L5 and L5-S1. Grade 1 anterolisthesis of L3 upon L4. There is apparent lucency surrounding the bilateral L2 pedicular screws, as could be seen in the setting of hardware loosening. Review of the MIP images confirms the above findings. IMPRESSION: Vascular Impression: 1. No evidence of thoracic or abdominal aortic aneurysm or dissection. 2. Cardiomegaly with dystrophic calcifications involving the aortic valve leaflets compatible with indication of impending TAVR. 3. Coronary artery calcifications. Aortic Atherosclerosis (ICD10-I70.0). Nonvascular Impression: 1. Cholelithiasis without evidence superimposed acute cholecystitis. 2. Small hiatal hernia. 3. Post L2-S1 paraspinal fusion with apparent lucency surrounding the bilateral L2 pedicular screws, as could be seen in the setting of hardware loosening. Clinical correlation is advised. Electronically Signed   By: Simonne Come M.D.   On: 04/18/2023 16:57   CT ANGIO CHEST AORTA W/CM & OR WO/CM  Result Date: 04/18/2023 CLINICAL DATA:  Preoperative examination prior to TAVR EXAM: CT ANGIOGRAPHY CHEST, ABDOMEN AND PELVIS TECHNIQUE: Non-contrast CT of the chest was initially obtained. Multidetector CT imaging through the chest, abdomen and pelvis was performed using the standard protocol during bolus administration of intravenous contrast. Multiplanar reconstructed images and MIPs were obtained and reviewed to evaluate the vascular  anatomy. RADIATION DOSE REDUCTION: This exam was performed according to the departmental dose-optimization program which includes automated exposure control, adjustment of the mA and/or kV according to patient size and/or use of iterative reconstruction technique. CONTRAST:  OMNIPAQUE IOHEXOL 350 MG/ML SOLN COMPARISON:  Lumbar spine CT-02/03/2022; lumbar spine MRI-03/31/2022 FINDINGS: CTA CHEST FINDINGS Vascular Findings: No evidence of thoracic aortic aneurysm or dissection with measurements as follows. Scattered atherosclerotic plaque within the thoracic aorta, not resulting in a hemodynamically significant stenosis. No perivascular stranding. Conventional configuration of the aortic arch. The branch vessels of the aortic arch appear patent throughout their imaged courses. The descending thoracic aorta is of normal caliber and widely patent without a hemodynamically significant narrowing. Cardiomegaly. Coronary artery calcifications. Calcifications involving the aortic valve leaflets. No pericardial effusion. Although this examination was not tailored for the evaluation the pulmonary arteries, there are no discrete filling defects within the central pulmonary arterial tree to suggest central pulmonary embolism. Normal caliber of the main pulmonary artery. ------------------------------------------------------------- Thoracic aortic measurements: SINOTUBULAR JUNCTION: 23 mm as measured in greatest oblique short axis coronal dimension. PROXIMAL ASCENDING THORACIC AORTA: 29 mm as measured in greatest oblique short axis axial dimension (axial image 54, series 6) at the level of the main pulmonary artery and approximately 29 mm as measured in greatest oblique short axis coronal dimension (coronal image 91, series 8) AORTIC ARCH: 22 mm as measured in greatest oblique short axis sagittal dimension. PROXIMAL DESCENDING THORACIC AORTA: 24 mm as measured in greatest oblique short axis axial dimension at the level of  the main pulmonary artery. DISTAL DESCENDING THORACIC AORTA: 22 mm as measured in greatest oblique short axis axial dimension at the level of the diaphragmatic hiatus. Review of the MIP images confirms the above findings. ------------------------------------------------------------- Non-Vascular Findings:  Mediastinum/Lymph Nodes: Mildly prominent though non pathologically enlarged bilateral axillary lymph nodes, presumably reactive in etiology. No bulky mediastinal, hilar or axillary lymphadenopathy Lungs/Pleura: Minimal dependent subpleural ground-glass atelectasis. No discrete focal airspace opacities. No air bronchograms. No pleural effusion or pneumothorax. The central pulmonary airways appear widely patent. No discrete pulmonary nodules. Musculoskeletal: Regional soft tissues appear normal. Normal appearance of the thyroid gland. _________________________________________________________ _________________________________________________________ CTA ABDOMEN AND PELVIS FINDINGS VASCULAR Aorta: Scattered atherosclerotic plaque within a normal caliber abdominal aorta, not resulting in a hemodynamically significant narrowing. No evidence of abdominal aortic dissection or perivascular stranding. Celiac: Widely patent without hemodynamically significant narrowing. Conventional branching pattern. SMA: Widely patent without hemodynamically significant narrowing. Conventional branching pattern. The distal tributaries of the SMA appear widely patent without discrete intraluminal filling defect to suggest distal embolism. Renals: Solitary bilaterally; the bilateral renal arteries are widely patent without a hemodynamically significant narrowing. No vessel irregularity to suggest FMD. IMA: Widely patent without hemodynamically significant narrowing Inflow: The bilateral common, external and internal iliac arteries are of normal caliber and widely patent without hemodynamically significant narrowing. Veins: The IVC and pelvic  venous systems appear patent on this arterial phase examination Review of the MIP images confirms the above findings. _________________________________________________________ NON-VASCULAR Evaluation of the abdominal organs is limited to the arterial phase of enhancement. Hepatobiliary: Normal hepatic contour. No discrete enhancing hepatic lesions. There is a 1.6 cm radiopaque gallstone within otherwise normal-appearing gallbladder. No gallbladder wall thickening or pericholecystic stranding. No intra or extrahepatic biliary ductal dilatation. No ascites. Pancreas: Normal appearance of the pancreas. Spleen: Normal early arterial phase appearance of the spleen. Adrenals/Urinary Tract: There is symmetric enhancement of the bilateral kidneys. No evidence of nephrolithiasis on this postcontrast examination. No discrete renal lesions. There is a minimal amount of age and body habitus related perinephric stranding. No urinary obstruction Normal appearance of the bilateral adrenal glands. Normal appearance of the urinary bladder given degree of distention. Stomach/Bowel: Scattered colonic diverticulosis without evidence superimposed acute diverticulitis. Large colonic stool burden without evidence of enteric obstruction. Normal appearance of the terminal ileum and the retrocecal appendix. Small hiatal hernia. No pneumoperitoneum, pneumatosis or portal venous gas. Lymphatic: No bulky retroperitoneal, mesenteric, pelvic or inguinal lymph adenopathy. Reproductive: Normal appearance of the pelvic organs. No free fluid in the pelvic cul-de-sac. Other: Subcutaneous edema about the midline of the low back. Musculoskeletal: No acute or aggressive osseous abnormalities. Post L2-S1 paraspinal fusion with intervertebral disc space cage device is seen at L4-L5 and L5-S1. Grade 1 anterolisthesis of L3 upon L4. There is apparent lucency surrounding the bilateral L2 pedicular screws, as could be seen in the setting of hardware loosening.  Review of the MIP images confirms the above findings. IMPRESSION: Vascular Impression: 1. No evidence of thoracic or abdominal aortic aneurysm or dissection. 2. Cardiomegaly with dystrophic calcifications involving the aortic valve leaflets compatible with indication of impending TAVR. 3. Coronary artery calcifications. Aortic Atherosclerosis (ICD10-I70.0). Nonvascular Impression: 1. Cholelithiasis without evidence superimposed acute cholecystitis. 2. Small hiatal hernia. 3. Post L2-S1 paraspinal fusion with apparent lucency surrounding the bilateral L2 pedicular screws, as could be seen in the setting of hardware loosening. Clinical correlation is advised. Electronically Signed   By: Simonne Come M.D.   On: 04/18/2023 16:57   CARDIAC CATHETERIZATION  Result Date: 04/06/2023 1.  Severe aortic stenosis with mean transvalvular gradient of 40 mmHg 2.  Widely patent coronary arteries with minimal irregularities and no significant stenosis Recommendations: Continue evaluation for aortic valve replacement    EKG: ST poor  R wave progression    ASSESSMENT AND PLAN:   CHF:  related to severe AS with mild/mod AR. EF normal Good symptomatic relief with iv 40 mg bid lasix. Have discussed with Dr Excell Seltzer. Concerns about putting a small 20 mm Sapien valve in her. Will sent CTA to [employer] to see if they think we can place a 23 with ? Less than 100% inflation. Suspect she will need 48 hours med adjustment and diuresis and then d/c with outpatient evaluation 12/12 by Dr Leafy Ro HTN:  also presented with HTN urgency Improved continue diuretic norvasc and coreg    Signed: Charlton Haws 04/28/2023, 11:36 AM

## 2023-04-28 NOTE — Telephone Encounter (Signed)
-----   Message from Charlton Haws sent at 04/28/2023  7:41 AM EST ----- BNP upper normal good dyspnea still likely from valve can have PRN lasix 10 mg

## 2023-04-28 NOTE — ED Notes (Signed)
Report given to [facility] RT at this time.

## 2023-04-28 NOTE — Progress Notes (Signed)
  Echocardiogram 2D Echocardiogram has been performed.  Ocie Doyne RDCS 04/28/2023, 11:06 AM

## 2023-04-28 NOTE — Plan of Care (Signed)
  Problem: Education: Goal: Knowledge of General Education information will improve Description: Including pain rating scale, medication(s)/side effects and non-pharmacologic comfort measures Outcome: Progressing   Problem: Health Behavior/Discharge Planning: Goal: Ability to manage health-related needs will improve Outcome: Progressing   Problem: Clinical Measurements: Goal: Ability to maintain clinical measurements within normal limits will improve Outcome: Progressing Goal: Will remain free from infection Outcome: Progressing Goal: Diagnostic test results will improve Outcome: Progressing Goal: Respiratory complications will improve Outcome: Progressing Goal: Cardiovascular complication will be avoided Outcome: Progressing   Problem: Activity: Goal: Risk for activity intolerance will decrease Outcome: Progressing   Problem: Nutrition: Goal: Adequate nutrition will be maintained Outcome: Progressing   Problem: Coping: Goal: Level of anxiety will decrease Outcome: Progressing   Problem: Elimination: Goal: Will not experience complications related to bowel motility Outcome: Progressing Goal: Will not experience complications related to urinary retention Outcome: Progressing   Problem: Pain Management: Goal: General experience of comfort will improve Outcome: Progressing   Problem: Safety: Goal: Ability to remain free from injury will improve Outcome: Progressing   Problem: Skin Integrity: Goal: Risk for impaired skin integrity will decrease Outcome: Progressing   Problem: Education: Goal: Ability to demonstrate management of disease process will improve Outcome: Progressing Goal: Ability to verbalize understanding of medication therapies will improve Outcome: Progressing   Problem: Activity: Goal: Capacity to carry out activities will improve Outcome: Progressing   Problem: Cardiac: Goal: Ability to achieve and maintain adequate cardiopulmonary perfusion  will improve Outcome: Progressing   Problem: Coping: Goal: Ability to adjust to condition or change in health will improve Outcome: Progressing   Problem: Fluid Volume: Goal: Ability to maintain a balanced intake and output will improve Outcome: Progressing   Problem: Health Behavior/Discharge Planning: Goal: Ability to identify and utilize available resources and services will improve Outcome: Progressing Goal: Ability to manage health-related needs will improve Outcome: Progressing   Problem: Metabolic: Goal: Ability to maintain appropriate glucose levels will improve Outcome: Progressing   Problem: Nutritional: Goal: Maintenance of adequate nutrition will improve Outcome: Progressing Goal: Progress toward achieving an optimal weight will improve Outcome: Progressing   Problem: Skin Integrity: Goal: Risk for impaired skin integrity will decrease Outcome: Progressing   Problem: Tissue Perfusion: Goal: Adequacy of tissue perfusion will improve Outcome: Progressing

## 2023-04-28 NOTE — TOC Initial Note (Signed)
Transition of Care [facility]) - Initial/Assessment Note    Patient Details  Name: [name] MRN: [id number] Date of Birth: 17-Nov-1955  [facility]) CM/SW Contact:    Leone Haven, RN Phone Number: 04/28/2023, 12:22 PM  Clinical Narrative:                 From home with spouse, has PCP and insurance on file, states has no HH services in place at this time , has walker and a cane at home.  States spouse will transport her home at [employer] and he is support system, states gets medications from [facility] on [address] and [place]  Pta self ambulatory with cane.  Expected Discharge Plan: Home/Self Care Barriers to Discharge: Continued Medical Work up   Patient Goals and CMS Choice Patient states their goals for this hospitalization and ongoing recovery are:: return home   Choice offered to / list presented to : NA      Expected Discharge Plan and Services In-house Referral: NA Discharge Planning Services: CM Consult Post Acute Care Choice: NA Living arrangements for the past 2 months: Single Family Home                 DME Arranged: N/A DME Agency: NA       HH Arranged: NA          Prior Living Arrangements/Services Living arrangements for the past 2 months: Single Family Home Lives with:: Spouse Patient language and need for interpreter reviewed:: Yes Do you feel safe going back to the place where you live?: Yes      Need for Family Participation in Patient Care: Yes (Comment) Care giver support system in place?: Yes (comment) Current home services: DME (cane, walker) Criminal Activity/Legal Involvement Pertinent to Current Situation/Hospitalization: No - Comment as needed  Activities of Daily Living   ADL Screening (condition at time of admission) Independently performs ADLs?: Yes (appropriate for developmental age) Is the patient deaf or have difficulty hearing?: No Does the patient have difficulty seeing, even when wearing glasses/contacts?:  No Does the patient have difficulty concentrating, remembering, or making decisions?: No  Permission Sought/Granted Permission sought to share information with : Case Manager Permission granted to share information with : Yes, Verbal Permission Granted  Share Information with NAME: spouse           Emotional Assessment Appearance:: Appears stated age Attitude/Demeanor/Rapport: Engaged Affect (typically observed): Appropriate Orientation: : Oriented to Self, Oriented to Place, Oriented to  Time, Oriented to Situation Alcohol / Substance Use: Not Applicable Psych Involvement: No (comment)  Admission diagnosis:  Acute exacerbation of CHF (congestive heart failure) (HCC) [I50.9] Acute hypoxic respiratory failure (HCC) [J96.01] Acute CHF (congestive heart failure) (HCC) [I50.9] Patient Active Problem List   Diagnosis Date Noted   Acute CHF (congestive heart failure) (HCC) 04/28/2023   Acute exacerbation of CHF (congestive heart failure) (HCC) 04/27/2023   Severe aortic stenosis    Stage 3b chronic kidney disease (HCC) 01/25/2023   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   Microcytic anemia 01/25/2023   Chronic kidney disease, stage 3a (HCC) 07/08/2022   Class 2 severe obesity due to excess calories with serious comorbidity and body mass index (BMI) of 38.0 to 38.9 in adult (HCC) 07/08/2022   Primary osteoarthritis of right knee 07/08/2022   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   Bacteremia due to Proteus species    Sepsis (HCC) 02/03/2022   Iron deficiency anemia 02/03/2022  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   S/P lumbar spinal fusion 11/17/2021   Lumbar radiculopathy 04/21/2021   Type 2 diabetes mellitus without complication, without long-term current use of insulin (HCC) 12/22/2018   Hypertriglyceridemia 10/19/2018   Spondylolisthesis of lumbar region 02/01/2018   Gastroesophageal reflux disease without  esophagitis 01/16/2018   Skin inflammation 08/25/2017   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   Essential hypertension 03/06/2017   Osteoarthritis of left hip 04/07/2014   BMI 40.0-44.9, adult (HCC) 04/07/2014   DM type 2 (diabetes mellitus, type 2) (HCC)    HTN (hypertension)    Hypercholesterolemia    Reflux    PCP:  [name], MD Pharmacy:   [id number] - [place], [place] - [address] AT [facility] [address] & [facility] [address] [place] [address] Phone: [phone] Fax: [phone]     Social Determinants of Health (SDOH) Social History: SDOH Screenings   Food Insecurity: No Food Insecurity (04/28/2023)  Housing: Low Risk  (04/28/2023)  Transportation Needs: No Transportation Needs (04/28/2023)  Utilities: Not At Risk (04/28/2023)  Depression (PHQ2-9): Low Risk  (04/26/2023)  Financial Resource Strain: Low Risk  (04/13/2023)   Received from [facility]  Physical Activity: Inactive (04/13/2023)   Received from [facility]  Social Connections: Somewhat Isolated (04/13/2023)   Received from [facility]  Stress: No Stress Concern Present (04/13/2023)   Received from [facility]  Tobacco Use: Low Risk  (04/27/2023)   SDOH Interventions:     Readmission Risk Interventions     No data to display

## 2023-04-28 NOTE — Progress Notes (Signed)
Heart Failure Navigator Progress Note  Assessed for Heart & Vascular TOC clinic readiness.  Patient EF 70-75%, scheduled for a TAVR consult on 05/05/2023. .   Navigator will sign off at this time.   Rhae Hammock, BSN, Scientist, clinical (histocompatibility and immunogenetics) Only

## 2023-04-29 DIAGNOSIS — I5023 Acute on chronic systolic (congestive) heart failure: Secondary | ICD-10-CM | POA: Diagnosis not present

## 2023-04-29 DIAGNOSIS — I5031 Acute diastolic (congestive) heart failure: Secondary | ICD-10-CM | POA: Diagnosis not present

## 2023-04-29 DIAGNOSIS — I350 Nonrheumatic aortic (valve) stenosis: Secondary | ICD-10-CM | POA: Diagnosis not present

## 2023-04-29 LAB — MAGNESIUM: Magnesium: 2 mg/dL (ref 1.7–2.4)

## 2023-04-29 LAB — BASIC METABOLIC PANEL
Anion gap: 13 (ref 5–15)
BUN: 26 mg/dL — ABNORMAL HIGH (ref 8–23)
CO2: 27 mmol/L (ref 22–32)
Calcium: 9.9 mg/dL (ref 8.9–10.3)
Chloride: 101 mmol/L (ref 98–111)
Creatinine, Ser: 1.45 mg/dL — ABNORMAL HIGH (ref 0.44–1.00)
GFR, Estimated: 40 mL/min — ABNORMAL LOW (ref >60)
Glucose, Bld: 120 mg/dL — ABNORMAL HIGH (ref 70–99)
Potassium: 3.7 mmol/L (ref 3.5–5.1)
Sodium: 141 mmol/L (ref 135–145)

## 2023-04-29 LAB — CBC
HCT: 29.7 % — ABNORMAL LOW (ref 36.0–46.0)
Hemoglobin: 9 g/dL — ABNORMAL LOW (ref 12.0–15.0)
MCH: 23.9 pg — ABNORMAL LOW (ref 26.0–34.0)
MCHC: 30.3 g/dL (ref 30.0–36.0)
MCV: 79 fL — ABNORMAL LOW (ref 80.0–100.0)
Platelets: 325 10*3/uL (ref 150–400)
RBC: 3.76 MIL/uL — ABNORMAL LOW (ref 3.87–5.11)
RDW: 18.6 % — ABNORMAL HIGH (ref 11.5–15.5)
WBC: 7.5 10*3/uL (ref 4.0–10.5)
nRBC: 0.3 % — ABNORMAL HIGH (ref 0.0–0.2)

## 2023-04-29 LAB — GLUCOSE, CAPILLARY
Glucose-Capillary: 134 mg/dL — ABNORMAL HIGH (ref 70–99)
Glucose-Capillary: 114 mg/dL — ABNORMAL HIGH (ref 70–99)
Glucose-Capillary: 81 mg/dL (ref 70–99)
Glucose-Capillary: 117 mg/dL — ABNORMAL HIGH (ref 70–99)

## 2023-04-29 LAB — PHOSPHORUS: Phosphorus: 5.1 mg/dL — ABNORMAL HIGH (ref 2.5–4.6)

## 2023-04-29 MED ORDER — POTASSIUM CHLORIDE CRYS ER 20 MEQ PO TBCR
40.0000 meq | EXTENDED_RELEASE_TABLET | Freq: Once | ORAL | Status: AC
  Administered 2023-04-29: 40 meq via ORAL
  Filled 2023-04-29: qty 2

## 2023-04-29 MED ORDER — GABAPENTIN 300 MG PO CAPS
300.0000 mg | ORAL_CAPSULE | Freq: Three times a day (TID) | ORAL | Status: DC
  Administered 2023-04-29 – 2023-04-30 (×3): 300 mg via ORAL
  Filled 2023-04-29 (×3): qty 1

## 2023-04-29 NOTE — Progress Notes (Signed)
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  Upon arrival noted to be hypoxic requiring 4 L nasal cannula and placed on BiPAP.  Chest x-ray showed signs of volume overload with vascular congestion.  She was also in hypertensive emergency with systolics greater than 200 requiring nitroglycerin drip.   Assessment & Plan:  Principal Problem:   Acute exacerbation of CHF (congestive heart failure) (HCC) Active Problems:   Acute CHF (congestive heart failure) (HCC)   Acute hypoxic respiratory failure requiring BiPAP Acute congestive heart failure with preserved EF, 75% Severe aortic stenosis -Patient follows outpatient with [facility] cardiology.  Recently underwent left heart catheterization which showed severe AS but widely patent coronary arteries with minimal irregularities.  Given severe AS could be leading to her CHF exacerbation, cardiology team assisting with medication management and diuresis.  Hypertensive emergency, improved -Initially placed on nitroglycerin drip and diuretics.  Resuming home medications at this time: Norvasc, Coreg.  Cardiology team will help with medication management   CKD stage III A -Creatinine currently at baseline of 1.4.  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  Currently on chronic suppressive therapy, Bactrim   Anxiety -no active issues   Anemia of chronic disease Hemoglobin stable around 9   GERD -PPI    HLD -continue on statin    Asthma -Not an active  exacerbation.  As needed bronchodilators   Obesity Morbid obesity  Complicates overall prognosis and care Lifestyle modification and exercise has been discussed with patient in detail   DMII  -Slight scale and Accu-Cheks  DVT prophylaxis: heparin injection 5,000 Units Start: 04/28/23 0600 Code Status: Full code Family Communication:   Status is: Inpatient Remains inpatient appropriate because: Discharge once cleared by cardiology    Subjective: Feeling better but still having some exertional dyspnea.  Examination:  General exam: Appears calm and comfortable  Respiratory system: Clear to auscultation. Respiratory effort normal. Cardiovascular system: S1 & S2 heard, RRR. No JVD, murmurs, rubs, gallops or clicks. No pedal edema. Gastrointestinal system: Abdomen is nondistended, soft and nontender. No organomegaly or masses felt. Normal bowel sounds heard. Central nervous system: Alert and oriented. No focal neurological deficits. Extremities: Symmetric 5 x 5 power. Skin: No rashes, lesions or ulcers Psychiatry: Judgement and insight appear normal. Mood & affect appropriate.                Diet Orders (From admission, onward)     Start     Ordered   04/28/23 0228  Diet heart healthy/carb modified Room service appropriate? Yes; Fluid consistency: Thin  Diet effective now       Question Answer Comment  Diet-HS Snack? Nothing   Room service appropriate? Yes   Fluid consistency: Thin      04/28/23 0230            Objective: Vitals:   04/29/23 0101 04/29/23 0242 04/29/23 0311 04/29/23 0807  BP: (!) 101/51  122/64 116/69  Pulse: 73 79 77 80  Resp: 20  18 18   Temp: 97.8 F (36.6 C)  98.1 F (36.7 C) 98.1 F (36.7 C)  TempSrc: Oral  Oral Oral  SpO2: 94% 95% 97% 95%  Weight:  87.4 kg    Height:        Intake/Output Summary (Last 24 hours) at 04/29/2023 1130 Last data filed at 04/28/2023 2000 Gross per 24 hour  Intake 480 ml  Output 450 ml  Net 30 ml    Filed Weights   04/27/23 2055 04/28/23 0224 04/29/23 0242  Weight: 93.1 kg 89.7 kg 87.4 kg    Scheduled Meds:  amLODipine  10 mg Oral Daily   carvedilol  25 mg Oral BID   docusate sodium  100 mg Oral BID   furosemide  40 mg Intravenous Q12H   gabapentin  300 mg Oral TID   heparin  5,000 Units Subcutaneous Q8H   insulin aspart  0-6 Units Subcutaneous TID WC   pantoprazole  40 mg Oral Daily   rosuvastatin  10 mg Oral QODAY   sulfamethoxazole-trimethoprim  1 tablet Oral BID   Continuous Infusions:  Nutritional status     Body mass index is 40.27 kg/m.  Data Reviewed:   CBC: Recent Labs  Lab 04/26/23 1053 04/27/23 1845 04/28/23 0246 04/29/23 0257  WBC 6.5 11.2* 8.2 7.5  NEUTROABS 3,634 6.1 4.9  --   HGB 9.2* 9.8* 9.0* 9.0*  HCT 30.4* 32.4* 30.3* 29.7*  MCV 78.1* 80.2 79.5* 79.0*  PLT 319 371 322 325   Basic Metabolic Panel: Recent Labs  Lab 04/26/23 1053 04/27/23 1845 04/28/23 0246 04/29/23 0257  NA 141 140 140 141  K 4.8 4.1 3.9 3.7  CL 103 103 102 101  CO2 30 26 28 27   GLUCOSE 117* 143* 121* 120*  BUN 23 25* 21 26*  CREATININE 1.40* 1.34* 1.30* 1.45*  CALCIUM 9.8 10.4* 9.6 9.9  MG  --   --  2.1 2.0  PHOS  --   --   --  5.1*   GFR: Estimated Creatinine Clearance: 36.3 mL/min (A) (by C-G formula based on SCr of 1.45 mg/dL (H)). Liver Function Tests: Recent Labs  Lab 04/26/23 1053 04/28/23 0246  AST 17 17  ALT 14 18  ALKPHOS  --  67  BILITOT 0.3 0.5  PROT 7.3 7.4  ALBUMIN  --  4.1   No results for input(s): "LIPASE", "AMYLASE" in the last 168 hours. No results for input(s): "AMMONIA" in the last 168 hours. Coagulation Profile: No results for input(s): "INR", "PROTIME" in the last 168 hours. Cardiac Enzymes: No results for input(s): "CKTOTAL", "CKMB", "CKMBINDEX", "TROPONINI" in the last 168 hours. BNP (last 3 results) Recent Labs    04/27/23 1113  PROBNP 315*   HbA1C: Recent Labs    04/28/23 0246  HGBA1C 6.2*   CBG: Recent  Labs  Lab 04/28/23 1129 04/28/23 1555 04/28/23 2028 04/29/23 0627 04/29/23 1107  GLUCAP 119* 116* 120* 134* 114*   Lipid Profile: Recent Labs    04/27/23 1114  LDLDIRECT 106*   Thyroid Function Tests: Recent Labs    04/28/23 0246  TSH 4.110   Anemia Panel: Recent Labs    04/28/23 0925  VITAMINB12 282  FOLATE 15.0  FERRITIN 35  TIBC 389  IRON 29  RETICCTPCT 2.2   Sepsis Labs: No results for input(s): "PROCALCITON", "LATICACIDVEN" in the last 168 hours.  Recent Results (from the past 240 hour(s))  Respiratory (~20 pathogens) panel by PCR     Status: None   Collection Time: 04/28/23  2:28 AM   Specimen:  Nasopharyngeal Swab; Respiratory  Result Value Ref Range Status   Adenovirus NOT DETECTED NOT DETECTED Final   Coronavirus 229E NOT DETECTED NOT DETECTED Final    Comment: (NOTE) The Coronavirus on the Respiratory Panel, DOES NOT test for the novel  Coronavirus (2019 nCoV)    Coronavirus HKU1 NOT DETECTED NOT DETECTED Final   Coronavirus NL63 NOT DETECTED NOT DETECTED Final   Coronavirus OC43 NOT DETECTED NOT DETECTED Final   Metapneumovirus NOT DETECTED NOT DETECTED Final   Rhinovirus / Enterovirus NOT DETECTED NOT DETECTED Final   Influenza A NOT DETECTED NOT DETECTED Final   Influenza B NOT DETECTED NOT DETECTED Final   Parainfluenza Virus 1 NOT DETECTED NOT DETECTED Final   Parainfluenza Virus 2 NOT DETECTED NOT DETECTED Final   Parainfluenza Virus 3 NOT DETECTED NOT DETECTED Final   Parainfluenza Virus 4 NOT DETECTED NOT DETECTED Final   Respiratory Syncytial Virus NOT DETECTED NOT DETECTED Final   Bordetella pertussis NOT DETECTED NOT DETECTED Final   Bordetella Parapertussis NOT DETECTED NOT DETECTED Final   Chlamydophila pneumoniae NOT DETECTED NOT DETECTED Final   Mycoplasma pneumoniae NOT DETECTED NOT DETECTED Final    Comment: Performed at [facility], [address], [place], [place] [address]         Radiology  Studies: ECHOCARDIOGRAM COMPLETE  Result Date: 04/28/2023    ECHOCARDIOGRAM REPORT   Patient Name:   [name] Date of Exam: 04/28/2023 Medical Rec #:  [id number]         Height:       58.0 in Accession #:    [id number]        Weight:       197.8 lb Date of Birth:  1955-09-15         BSA:          1.812 m Patient Age:    67 years          BP:           141/69 mmHg Patient Gender: F                 HR:           85 bpm. Exam Location:  Inpatient Procedure: 2D Echo, Cardiac Doppler and Color Doppler Indications:    CHF-acute systolic  History:        Patient has prior history of Echocardiogram examinations, most                 recent 03/15/2023. CHF, Signs/Symptoms:Murmur and Dyspnea; Risk                 Factors:Hypertension, Diabetes and Dyslipidemia.  Sonographer:    Vern Claude Referring Phys: [id number] SARA-MAIZ A THOMAS IMPRESSIONS  1. Left ventricular ejection fraction, by estimation, is 60 to 65%. The left ventricle has normal function. The left ventricle has no regional wall motion abnormalities. There is mild concentric left ventricular hypertrophy. Left ventricular diastolic parameters are consistent with Grade II diastolic dysfunction (pseudonormalization).  2. Right ventricular systolic function is normal. The right ventricular size is normal.  3. The mitral valve is normal in structure. Mild mitral valve regurgitation. No evidence of mitral stenosis.  4. The aortic valve is severely calcified but otherwise not well visualized. Aortic valve regurgitation is mild to moderate. Severe aortic valve stenosis with mean gradient of (manually measured) and AVA by VTI less than 1cm2.  5. The inferior vena cava is normal in size with greater than  50% respiratory variability, suggesting right atrial pressure of 3 mmHg. FINDINGS  Left Ventricle: Left ventricular ejection fraction, by estimation, is 60 to 65%. The left ventricle has normal function. The left ventricle has no regional wall motion  abnormalities. The left ventricular internal cavity size was normal in size. There is  mild concentric left ventricular hypertrophy. Left ventricular diastolic parameters are consistent with Grade II diastolic dysfunction (pseudonormalization). Right Ventricle: The right ventricular size is normal. No increase in right ventricular wall thickness. Right ventricular systolic function is normal. Left Atrium: Left atrial size was normal in size. Right Atrium: Right atrial size was normal in size. Pericardium: There is no evidence of pericardial effusion. Mitral Valve: The mitral valve is normal in structure. Mild mitral valve regurgitation. No evidence of mitral valve stenosis. MV peak gradient, 6.8 mmHg. The mean mitral valve gradient is 3.5 mmHg. Tricuspid Valve: The tricuspid valve is normal in structure. Tricuspid valve regurgitation is trivial. No evidence of tricuspid stenosis. Aortic Valve: The aortic valve is calcified. Aortic valve regurgitation is mild to moderate. Aortic regurgitation PHT measures 432 msec. Severe aortic stenosis is present. Aortic valve mean gradient measures 30.6 mmHg. Aortic valve peak gradient measures  69.0 mmHg. Aortic valve area, by VTI measures 0.93 cm. Pulmonic Valve: The pulmonic valve was normal in structure. Pulmonic valve regurgitation is not visualized. No evidence of pulmonic stenosis. Aorta: The aortic root is normal in size and structure. Venous: The inferior vena cava is normal in size with greater than 50% respiratory variability, suggesting right atrial pressure of 3 mmHg. IAS/Shunts: No atrial level shunt detected by color flow Doppler.  LEFT VENTRICLE PLAX 2D LVIDd:         3.40 cm      Diastology LVIDs:         1.80 cm      LV e' medial:    6.20 cm/s LV PW:         0.90 cm      LV E/e' medial:  20.6 LV IVS:        1.00 cm      LV e' lateral:   5.11 cm/s LVOT diam:     1.80 cm      LV E/e' lateral: 25.0 LV SV:         69 LV SV Index:   38 LVOT Area:     2.54 cm  LV  Volumes (MOD) LV vol d, MOD A2C: 92.7 ml LV vol d, MOD A4C: 144.0 ml LV vol s, MOD A2C: 29.2 ml LV vol s, MOD A4C: 39.4 ml LV SV MOD A2C:     63.5 ml LV SV MOD A4C:     144.0 ml LV SV MOD BP:      94.9 ml RIGHT VENTRICLE             IVC RV Basal diam:  3.10 cm     IVC diam: 1.20 cm RV Mid diam:    2.70 cm RV S prime:     14.60 cm/s TAPSE (M-mode): 3.3 cm LEFT ATRIUM             Index        RIGHT ATRIUM          Index LA diam:        3.10 cm 1.71 cm/m   RA Area:     9.52 cm LA Vol (A2C):   34.0 ml 18.76 ml/m  RA Volume:   18.50 ml 10.21 ml/m  LA Vol (A4C):   39.5 ml 21.79 ml/m LA Biplane Vol: 39.9 ml 22.02 ml/m  AORTIC VALVE                     PULMONIC VALVE AV Area (Vmax):    0.77 cm      PV Vmax:       1.25 m/s AV Area (Vmean):   0.85 cm      PV Peak grad:  6.3 mmHg AV Area (VTI):     0.93 cm AV Vmax:           415.40 cm/s AV Vmean:          236.600 cm/s AV VTI:            0.747 m AV Peak Grad:      69.0 mmHg AV Mean Grad:      30.6 mmHg LVOT Vmax:         126.00 cm/s LVOT Vmean:        78.800 cm/s LVOT VTI:          0.273 m LVOT/AV VTI ratio: 0.37 AI PHT:            432 msec  AORTA Ao Root diam: 2.40 cm Ao Asc diam:  2.90 cm MITRAL VALVE MV Area (PHT): 3.60 cm     SHUNTS MV Area VTI:   2.12 cm     Systemic VTI:  0.27 m MV Peak grad:  6.8 mmHg     Systemic Diam: 1.80 cm MV Mean grad:  3.5 mmHg MV Vmax:       1.30 m/s MV Vmean:      83.6 cm/s MV Decel Time: 211 msec MV E velocity: 128.00 cm/s MV A velocity: 129.00 cm/s MV E/A ratio:  0.99 Aditya Sabharwal Electronically signed by Dorthula Nettles Signature Date/Time: 04/28/2023/11:29:58 AM    Final    DG Chest Portable 1 View  Result Date: 04/27/2023 CLINICAL DATA:  Shortness of breath EXAM: PORTABLE CHEST 1 VIEW COMPARISON:  03/09/2022 FINDINGS: Cardiomegaly with vascular congestion and probable small pleural effusions. Mild diffuse hazy pulmonary density, probably edema. No pneumothorax IMPRESSION: Cardiomegaly with vascular congestion and probable  small pleural effusions. Mild diffuse hazy pulmonary density, probably edema. Electronically Signed   By: Jasmine Pang M.D.   On: 04/27/2023 19:54           LOS: 1 day   Time spent= 35 mins    Miguel Rota, MD [facility]  If 7PM-7AM, please contact night-coverage  04/29/2023, 11:30 AM

## 2023-04-29 NOTE — Progress Notes (Signed)
Pt. Refused cpap. Pt stated she doesn't use one at home and doesn't want to start.

## 2023-04-29 NOTE — Progress Notes (Signed)
Mobility Specialist Progress Note:    04/29/23 1420  Mobility  Activity Ambulated with assistance in hallway  Level of Assistance Standby assist, set-up cues, supervision of patient - no hands on  Assistive Device Front wheel walker  Distance Ambulated (ft) 400 ft  Activity Response Tolerated well  Mobility Referral Yes  Mobility visit 1 Mobility  Mobility Specialist Start Time (ACUTE ONLY) 1348  Mobility Specialist Stop Time (ACUTE ONLY) 1405  Mobility Specialist Time Calculation (min) (ACUTE ONLY) 17 min   Received pt supine in bed having no complaints and agreeable to mobility. Pt was asymptomatic throughout ambulation and returned to room w/o fault. Left sitting EOB w/ call bell in reach and all needs met.   Thompson Grayer Mobility Specialist  Please contact vis Secure Chat or  Rehab Office [phone]

## 2023-04-29 NOTE — Plan of Care (Signed)

## 2023-04-29 NOTE — Plan of Care (Signed)
  Problem: Education: Goal: Knowledge of General Education information will improve Description: Including pain rating scale, medication(s)/side effects and non-pharmacologic comfort measures Outcome: Progressing   Problem: Clinical Measurements: Goal: Will remain free from infection Outcome: Progressing Goal: Respiratory complications will improve Outcome: Progressing   Problem: Activity: Goal: Risk for activity intolerance will decrease Outcome: Progressing   Problem: Elimination: Goal: Will not experience complications related to urinary retention Outcome: Progressing   Problem: Safety: Goal: Ability to remain free from injury will improve Outcome: Progressing

## 2023-04-29 NOTE — Progress Notes (Signed)
Subjective:  Denies SSCP, palpitations or Dyspnea Feels better after lasix Has only been OOB to bathroom  Objective:  Vitals:   04/29/23 0101 04/29/23 0242 04/29/23 0311 04/29/23 0807  BP: (!) 101/51  122/64 116/69  Pulse: 73 79 77 80  Resp: 20  18 18   Temp: 97.8 F (36.6 C)  98.1 F (36.7 C) 98.1 F (36.7 C)  TempSrc: Oral  Oral Oral  SpO2: 94% 95% 97% 95%  Weight:  87.4 kg    Height:        Intake/Output from previous day:  Intake/Output Summary (Last 24 hours) at 04/29/2023 0854 Last data filed at 04/28/2023 2000 Gross per 24 hour  Intake 480 ml  Output 450 ml  Net 30 ml    Physical Exam: Obese black female Lungs basilar atelectasis AS murmur with diminished S2 Abdomen benign Plus one edema  Lab Results: Basic Metabolic Panel: Recent Labs    04/28/23 0246 04/29/23 0257  NA 140 141  K 3.9 3.7  CL 102 101  CO2 28 27  GLUCOSE 121* 120*  BUN 21 26*  CREATININE 1.30* 1.45*  CALCIUM 9.6 9.9  MG 2.1 2.0  PHOS  --  5.1*   Liver Function Tests: Recent Labs    04/26/23 1053 04/28/23 0246  AST 17 17  ALT 14 18  ALKPHOS  --  67  BILITOT 0.3 0.5  PROT 7.3 7.4  ALBUMIN  --  4.1   No results for input(s): "LIPASE", "AMYLASE" in the last 72 hours. CBC: Recent Labs    04/27/23 1845 04/28/23 0246 04/29/23 0257  WBC 11.2* 8.2 7.5  NEUTROABS 6.1 4.9  --   HGB 9.8* 9.0* 9.0*  HCT 32.4* 30.3* 29.7*  MCV 80.2 79.5* 79.0*  PLT 371 322 325   Cardiac Enzymes: No results for input(s): "CKTOTAL", "CKMB", "CKMBINDEX", "TROPONINI" in the last 72 hours. BNP: Invalid input(s): "POCBNP" D-Dimer: No results for input(s): "DDIMER" in the last 72 hours. Hemoglobin A1C: Recent Labs    04/28/23 0246  HGBA1C 6.2*   Fasting Lipid Panel: Recent Labs    04/27/23 1114  LDLDIRECT 106*   Thyroid Function Tests: Recent Labs    04/28/23 0246  TSH 4.110   Anemia Panel: Recent Labs    04/28/23 0925  VITAMINB12 282  FOLATE 15.0  FERRITIN 35  TIBC  389  IRON 29  RETICCTPCT 2.2    Imaging: ECHOCARDIOGRAM COMPLETE  Result Date: 04/28/2023    ECHOCARDIOGRAM REPORT   Patient Name:   GENESA ARGENTO Date of Exam: 04/28/2023 Medical Rec #:  [id number]         Height:       58.0 in Accession #:    [id number]        Weight:       197.8 lb Date of Birth:  26-Apr-1956         BSA:          1.812 m Patient Age:    67 years          BP:           141/69 mmHg Patient Gender: F                 HR:           85 bpm. Exam Location:  Inpatient Procedure: 2D Echo, Cardiac Doppler and Color Doppler Indications:    CHF-acute systolic  History:        Patient has prior history  of Echocardiogram examinations, most                 recent 03/15/2023. CHF, Signs/Symptoms:Murmur and Dyspnea; Risk                 Factors:Hypertension, Diabetes and Dyslipidemia.  Sonographer:    Vern Claude Referring Phys: [id number] SARA-MAIZ A THOMAS IMPRESSIONS  1. Left ventricular ejection fraction, by estimation, is 60 to 65%. The left ventricle has normal function. The left ventricle has no regional wall motion abnormalities. There is mild concentric left ventricular hypertrophy. Left ventricular diastolic parameters are consistent with Grade II diastolic dysfunction (pseudonormalization).  2. Right ventricular systolic function is normal. The right ventricular size is normal.  3. The mitral valve is normal in structure. Mild mitral valve regurgitation. No evidence of mitral stenosis.  4. The aortic valve is severely calcified but otherwise not well visualized. Aortic valve regurgitation is mild to moderate. Severe aortic valve stenosis with mean gradient of (manually measured) and AVA by VTI less than 1cm2.  5. The inferior vena cava is normal in size with greater than 50% respiratory variability, suggesting right atrial pressure of 3 mmHg. FINDINGS  Left Ventricle: Left ventricular ejection fraction, by estimation, is 60 to 65%. The left ventricle has normal function. The left  ventricle has no regional wall motion abnormalities. The left ventricular internal cavity size was normal in size. There is  mild concentric left ventricular hypertrophy. Left ventricular diastolic parameters are consistent with Grade II diastolic dysfunction (pseudonormalization). Right Ventricle: The right ventricular size is normal. No increase in right ventricular wall thickness. Right ventricular systolic function is normal. Left Atrium: Left atrial size was normal in size. Right Atrium: Right atrial size was normal in size. Pericardium: There is no evidence of pericardial effusion. Mitral Valve: The mitral valve is normal in structure. Mild mitral valve regurgitation. No evidence of mitral valve stenosis. MV peak gradient, 6.8 mmHg. The mean mitral valve gradient is 3.5 mmHg. Tricuspid Valve: The tricuspid valve is normal in structure. Tricuspid valve regurgitation is trivial. No evidence of tricuspid stenosis. Aortic Valve: The aortic valve is calcified. Aortic valve regurgitation is mild to moderate. Aortic regurgitation PHT measures 432 msec. Severe aortic stenosis is present. Aortic valve mean gradient measures 30.6 mmHg. Aortic valve peak gradient measures  69.0 mmHg. Aortic valve area, by VTI measures 0.93 cm. Pulmonic Valve: The pulmonic valve was normal in structure. Pulmonic valve regurgitation is not visualized. No evidence of pulmonic stenosis. Aorta: The aortic root is normal in size and structure. Venous: The inferior vena cava is normal in size with greater than 50% respiratory variability, suggesting right atrial pressure of 3 mmHg. IAS/Shunts: No atrial level shunt detected by color flow Doppler.  LEFT VENTRICLE PLAX 2D LVIDd:         3.40 cm      Diastology LVIDs:         1.80 cm      LV e' medial:    6.20 cm/s LV PW:         0.90 cm      LV E/e' medial:  20.6 LV IVS:        1.00 cm      LV e' lateral:   5.11 cm/s LVOT diam:     1.80 cm      LV E/e' lateral: 25.0 LV SV:         69 LV SV  Index:   38 LVOT Area:  2.54 cm  LV Volumes (MOD) LV vol d, MOD A2C: 92.7 ml LV vol d, MOD A4C: 144.0 ml LV vol s, MOD A2C: 29.2 ml LV vol s, MOD A4C: 39.4 ml LV SV MOD A2C:     63.5 ml LV SV MOD A4C:     144.0 ml LV SV MOD BP:      94.9 ml RIGHT VENTRICLE             IVC RV Basal diam:  3.10 cm     IVC diam: 1.20 cm RV Mid diam:    2.70 cm RV S prime:     14.60 cm/s TAPSE (M-mode): 3.3 cm LEFT ATRIUM             Index        RIGHT ATRIUM          Index LA diam:        3.10 cm 1.71 cm/m   RA Area:     9.52 cm LA Vol (A2C):   34.0 ml 18.76 ml/m  RA Volume:   18.50 ml 10.21 ml/m LA Vol (A4C):   39.5 ml 21.79 ml/m LA Biplane Vol: 39.9 ml 22.02 ml/m  AORTIC VALVE                     PULMONIC VALVE AV Area (Vmax):    0.77 cm      PV Vmax:       1.25 m/s AV Area (Vmean):   0.85 cm      PV Peak grad:  6.3 mmHg AV Area (VTI):     0.93 cm AV Vmax:           415.40 cm/s AV Vmean:          236.600 cm/s AV VTI:            0.747 m AV Peak Grad:      69.0 mmHg AV Mean Grad:      30.6 mmHg LVOT Vmax:         126.00 cm/s LVOT Vmean:        78.800 cm/s LVOT VTI:          0.273 m LVOT/AV VTI ratio: 0.37 AI PHT:            432 msec  AORTA Ao Root diam: 2.40 cm Ao Asc diam:  2.90 cm MITRAL VALVE MV Area (PHT): 3.60 cm     SHUNTS MV Area VTI:   2.12 cm     Systemic VTI:  0.27 m MV Peak grad:  6.8 mmHg     Systemic Diam: 1.80 cm MV Mean grad:  3.5 mmHg MV Vmax:       1.30 m/s MV Vmean:      83.6 cm/s MV Decel Time: 211 msec MV E velocity: 128.00 cm/s MV A velocity: 129.00 cm/s MV E/A ratio:  0.99 Aditya Sabharwal Electronically signed by Dorthula Nettles Signature Date/Time: 04/28/2023/11:29:58 AM    Final    DG Chest Portable 1 View  Result Date: 04/27/2023 CLINICAL DATA:  Shortness of breath EXAM: PORTABLE CHEST 1 VIEW COMPARISON:  03/09/2022 FINDINGS: Cardiomegaly with vascular congestion and probable small pleural effusions. Mild diffuse hazy pulmonary density, probably edema. No pneumothorax IMPRESSION:  Cardiomegaly with vascular congestion and probable small pleural effusions. Mild diffuse hazy pulmonary density, probably edema. Electronically Signed   By: Jasmine Pang M.D.   On: 04/27/2023 19:54    Cardiac Studies:  ECG: ST poor R  wave progression    Telemetry:  NSR   Echo: 12/5 EF 60-65% mild/mod AR severe AS mean gradient 44 mmHg   Medications:    amLODipine  10 mg Oral Daily   carvedilol  25 mg Oral BID   docusate sodium  100 mg Oral BID   furosemide  40 mg Intravenous Q12H   gabapentin  600 mg Oral TID   heparin  5,000 Units Subcutaneous Q8H   insulin aspart  0-6 Units Subcutaneous TID WC   pantoprazole  40 mg Oral Daily   rosuvastatin  10 mg Oral QODAY   sulfamethoxazole-trimethoprim  1 tablet Oral BID      Assessment/Plan:   Severe AS:  difficult situation Need TAVR/SAVR. Presents with CHF improved with lasix K 3.7 supplement Cr up a bit 1.45 give lasix 40 mg iv today change to PO in am. Has appointment with Dr Leafy Ro 12/12 She is young and would benefit from [facility] but very poor functional status due to chronic back issues. CTA shows not ideal candidate for TAVR Can only fit 20 mm Sapien valve which would lead to patient prosthetic mis-match and sinuses small for 23 mm [employer]onic valve No CAD on cath 04/06/23 Will be discussed at structural meeting 12/10 and then see surgeon.  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

## 2023-04-30 ENCOUNTER — Other Ambulatory Visit (HOSPITAL_COMMUNITY): Payer: Self-pay

## 2023-04-30 DIAGNOSIS — I5033 Acute on chronic diastolic (congestive) heart failure: Secondary | ICD-10-CM | POA: Diagnosis not present

## 2023-04-30 DIAGNOSIS — I350 Nonrheumatic aortic (valve) stenosis: Secondary | ICD-10-CM | POA: Diagnosis not present

## 2023-04-30 LAB — CBC
HCT: 30.4 % — ABNORMAL LOW (ref 36.0–46.0)
Hemoglobin: 9.3 g/dL — ABNORMAL LOW (ref 12.0–15.0)
MCH: 23.9 pg — ABNORMAL LOW (ref 26.0–34.0)
MCHC: 30.6 g/dL (ref 30.0–36.0)
MCV: 78.1 fL — ABNORMAL LOW (ref 80.0–100.0)
Platelets: 326 10*3/uL (ref 150–400)
RBC: 3.89 MIL/uL (ref 3.87–5.11)
RDW: 18.5 % — ABNORMAL HIGH (ref 11.5–15.5)
WBC: 8.9 10*3/uL (ref 4.0–10.5)
nRBC: 0.3 % — ABNORMAL HIGH (ref 0.0–0.2)

## 2023-04-30 LAB — MAGNESIUM: Magnesium: 2 mg/dL (ref 1.7–2.4)

## 2023-04-30 LAB — GLUCOSE, CAPILLARY
Glucose-Capillary: 96 mg/dL (ref 70–99)
Glucose-Capillary: 96 mg/dL (ref 70–99)

## 2023-04-30 LAB — BASIC METABOLIC PANEL
Anion gap: 14 (ref 5–15)
BUN: 39 mg/dL — ABNORMAL HIGH (ref 8–23)
CO2: 25 mmol/L (ref 22–32)
Calcium: 9.6 mg/dL (ref 8.9–10.3)
Chloride: 99 mmol/L (ref 98–111)
Creatinine, Ser: 1.83 mg/dL — ABNORMAL HIGH (ref 0.44–1.00)
GFR, Estimated: 30 mL/min — ABNORMAL LOW (ref >60)
Glucose, Bld: 125 mg/dL — ABNORMAL HIGH (ref 70–99)
Potassium: 4 mmol/L (ref 3.5–5.1)
Sodium: 138 mmol/L (ref 135–145)

## 2023-04-30 MED ORDER — FUROSEMIDE 20 MG PO TABS
40.0000 mg | ORAL_TABLET | Freq: Every day | ORAL | 0 refills | Status: DC
  Filled 2023-04-30: qty 30, 15d supply, fill #0

## 2023-04-30 NOTE — Progress Notes (Signed)
Mobility Specialist Progress Note:   04/30/23 0905  Mobility  Activity Ambulated with assistance in hallway  Level of Assistance Standby assist, set-up cues, supervision of patient - no hands on  Assistive Device None  Distance Ambulated (ft) 300 ft  Activity Response Tolerated well  Mobility Referral Yes  Mobility visit 1 Mobility  Mobility Specialist Start Time (ACUTE ONLY) 0905  Mobility Specialist Stop Time (ACUTE ONLY) 0915  Mobility Specialist Time Calculation (min) (ACUTE ONLY) 10 min   Pt agreeable to mobility session. Required no physical assistance throughout, only supervision. No AD needed. Pt asx throughout, back in bed with all needs met.   Addison Lank Mobility Specialist Please contact via SecureChat or  Rehab office at [phone]

## 2023-04-30 NOTE — Plan of Care (Signed)
Pt voices understanding regarding prognosis and future treatment plan

## 2023-04-30 NOTE — Discharge Summary (Signed)
Physician Discharge Summary  ANNAELIZABETH RAISBECK NFA:[id number] DOB: 04/15/1956 DOA: 04/27/2023  PCP: [name], MD  Admit date: 04/27/2023 Discharge date: 04/30/2023  Admitted From: Home Disposition: Home  Recommendations for Outpatient Follow-up:  Follow up with PCP in 1-2 weeks Please obtain BMP/CBC in one week your next doctors visit.  Lasix 40 mg p.o. 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  Upon arrival noted to be hypoxic requiring 4 L nasal cannula and placed on BiPAP.  Chest x-ray showed signs of volume overload with vascular congestion.  She was also in hypertensive emergency with systolics greater than 200 requiring nitroglycerin drip. Eventually she was diuresed in the hospital, nitroglycerin was weaned off.  She was transitioned to p.o. Lasix. Medically stable for discharge today.  Assessment & Plan:  Principal Problem:   Acute exacerbation of CHF (congestive heart failure) (HCC) Active Problems:   Acute CHF (congestive heart failure) (HCC)   Acute hypoxic respiratory failure requiring BiPAP Acute congestive heart failure with preserved EF, 75% Severe aortic stenosis -Patient follows outpatient with [facility] cardiology.  Recently underwent left heart catheterization which showed severe AS but widely patent coronary arteries with minimal irregularities.  Given severe AS could be leading to her CHF exacerbation, seen by cardiology team.  She has been diuresed well.  Now will transition to Lasix 40 mg p.o. daily and to be discharged  home.  Has outpatient appointment with CT surgery next week.  Cardiology team will also have structural meeting regarding her further care.  Hypertensive emergency, improved -Initially placed on nitroglycerin drip and diuretics.  Resuming home medications at this time: Norvasc, Coreg.  Cardiology team will help with medication management   CKD stage III A -Creatinine currently at baseline of 1.4.  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  Currently on chronic suppressive therapy, Bactrim   Anxiety -no active issues   Anemia of chronic disease Hemoglobin stable around 9   GERD -PPI    HLD -continue on statin    Asthma -Not an active exacerbation.  As needed bronchodilators   Obesity Morbid obesity  Complicates overall prognosis and care Lifestyle modification and exercise has been discussed with patient in detail   DMII  -Slight scale and Accu-Cheks  DVT prophylaxis: heparin injection 5,000 Units Start: 04/28/23 0600 Code Status: Full code Family Communication:   Status is: Inpatient Remains inpatient appropriate because: Discharge today  Subjective: Ambulating well in the hallway.  Wishing to go home  Examination:  General exam: Appears calm and comfortable  Respiratory system: Clear to auscultation. Respiratory effort normal. Cardiovascular system: S1 & S2 heard, RRR. No JVD, murmurs, rubs, gallops or clicks. No pedal edema. Gastrointestinal system: Abdomen is nondistended, soft and nontender. No organomegaly or masses felt. Normal bowel sounds heard. Central nervous system: Alert and oriented. No focal neurological deficits. Extremities: Symmetric 5 x 5 power. Skin: No rashes, lesions or ulcers Psychiatry: Judgement and insight appear normal. Mood & affect appropriate.    Discharge Diagnoses:  Principal Problem:  Acute exacerbation of CHF  (congestive heart failure) (HCC) Active Problems:   Acute CHF (congestive heart failure) (HCC)        Discharge Exam: Vitals:   04/30/23 0606 04/30/23 0750  BP: 129/64 (!) 127/52  Pulse: 76 81  Resp: 18 16  Temp: 98 F (36.7 C) 98.4 F (36.9 C)  SpO2: 98% 95%   Vitals:   04/29/23 0807 04/29/23 1957 04/30/23 0606 04/30/23 0750  BP: 116/69 (!) 125/57 129/64 (!) 127/52  Pulse: 80 76 76 81  Resp: 18 18 18 16   Temp: 98.1 F (36.7 C) 98.3 F (36.8 C) 98 F (36.7 C) 98.4 F (36.9 C)  TempSrc: Oral Oral Oral Oral  SpO2: 95% 97% 98% 95%  Weight:   87.4 kg   Height:        Discharge Instructions   Allergies as of 04/30/2023       Reactions   Crestor [rosuvastatin] Other (See Comments)   Myalgia    Robaxin [methocarbamol] Other (See Comments)   Insomnia   Toradol [ketorolac Tromethamine] Other (See Comments)   Zocor [simvastatin] Other (See Comments)   Myalgias    Lipitor [atorvastatin] Other (See Comments)   Myalgia   Sulfa Antibiotics Hives        Medication List     TAKE these medications    acetaminophen 500 MG tablet Commonly known as: TYLENOL Take 1,000 mg by mouth every 6 (six) hours as needed for mild pain (pain score 1-3) or headache.   albuterol 108 (90 Base) MCG/ACT inhaler Commonly known as: ProAir HFA Inhale 2 puffs into the lungs every 6 (six) hours as needed.   amLODipine 10 MG tablet Commonly known as: NORVASC Take 1 tablet (10 mg total) by mouth daily. What changed: when to take this   ascorbic acid 500 MG tablet Commonly known as: VITAMIN C Take 500 mg by mouth daily.   carvedilol 25 MG tablet Commonly known as: COREG Take 1 tablet (25 mg total) by mouth 2 (two) times daily. What changed: Another medication with the same name was removed. Continue taking this medication, and follow the directions you see here.   celecoxib 200 MG capsule Commonly known as: CELEBREX Take 200 mg by mouth 2 (two) times daily.   cetirizine 10 MG  tablet Commonly known as: ZYRTEC Take 1 tablet (10 mg total) by mouth daily.   docusate sodium 100 MG capsule Commonly known as: COLACE Take 200-300 mg by mouth at bedtime.   Ferrex 150 150 MG capsule Generic drug: iron polysaccharides TAKE 1 CAPSULE (150 MG DOSE) BY MOUTH TWICE A DAY What changed:  how much to take how to take this when to take this   fluticasone 50 MCG/ACT nasal spray Commonly known as: FLONASE Place 2 sprays into both nostrils daily. What changed:  when to take this reasons to take this   furosemide 20 MG tablet Commonly known as: LASIX Take 2 tablets (40 mg total) by mouth daily.   gabapentin 600 MG tablet Commonly known as: NEURONTIN Take 600 mg by mouth 3 (three) times daily.   Hyzaar 100-25 MG tablet Generic drug: losartan-hydrochlorothiazide Take 1 tablet by mouth daily.   metFORMIN 1000 MG tablet Commonly known as: GLUCOPHAGE Take 1 tablet (1,000 mg total) by mouth 2 (two) times daily with a meal. What changed: when to take this   Ozempic (2 MG/DOSE) 8 MG/3ML Sopn Generic drug: Semaglutide (2 MG/DOSE) Inject 2 mg into the skin once a week.   pantoprazole  40 MG tablet Commonly known as: PROTONIX Take 40 mg by mouth daily.   polyvinyl alcohol 1.4 % ophthalmic solution Commonly known as: LIQUIFILM TEARS Place 1 drop into both eyes as needed for dry eyes.   rosuvastatin 10 MG tablet Commonly known as: CRESTOR Take 1 tablet (10 mg total) by mouth every other day.   sulfamethoxazole-trimethoprim 400-80 MG tablet Commonly known as: BACTRIM TAKE 1 TABLET BY MOUTH TWICE DAILY        Allergies  Allergen Reactions   Crestor [Rosuvastatin] Other (See Comments)    Myalgia    Robaxin [Methocarbamol] Other (See Comments)    Insomnia   Toradol [Ketorolac Tromethamine] Other (See Comments)   Zocor [Simvastatin] Other (See Comments)    Myalgias    Lipitor [Atorvastatin] Other (See Comments)    Myalgia   Sulfa Antibiotics Hives     You were cared for by a hospitalist during your hospital stay. If you have any questions about your discharge medications or the care you received while you were in the hospital after you are discharged, you can call the unit and asked to speak with the hospitalist on call if the hospitalist that took care of you is not available. Once you are discharged, your primary care physician will handle any further medical issues. Please note that no refills for any discharge medications will be authorized once you are discharged, as it is imperative that you return to your primary care physician (or establish a relationship with a primary care physician if you do not have one) for your aftercare needs so that they can reassess your need for medications and monitor your lab values.  You were cared for by a hospitalist during your hospital stay. If you have any questions about your discharge medications or the care you received while you were in the hospital after you are discharged, you can call the unit and asked to speak with the hospitalist on call if the hospitalist that took care of you is not available. Once you are discharged, your primary care physician will handle any further medical issues. Please note that NO REFILLS for any discharge medications will be authorized once you are discharged, as it is imperative that you return to your primary care physician (or establish a relationship with a primary care physician if you do not have one) for your aftercare needs so that they can reassess your need for medications and monitor your lab values.  Please request your Prim.MD to go over all Hospital Tests and Procedure/Radiological results at the follow up, please get all Hospital records sent to your Prim MD by signing hospital release before you go home.  Get CBC, CMP, 2 view Chest X ray checked  by Primary MD during your next visit or SNF MD in 5-7 days ( we routinely change or add medications that can  affect your baseline labs and fluid status, therefore we recommend that you get the mentioned basic workup next visit with your PCP, your PCP may decide not to get them or add new tests based on their clinical decision)  On your next visit with your primary care physician please Get Medicines reviewed and adjusted.  If you experience worsening of your admission symptoms, develop shortness of breath, life threatening emergency, suicidal or homicidal thoughts you must seek medical attention immediately by calling 911 or calling your MD immediately  if symptoms less severe.  You Must read complete instructions/literature along with all the possible adverse reactions/side effects for all  the Medicines you take and that have been prescribed to you. Take any new Medicines after you have completely understood and accpet all the possible adverse reactions/side effects.   Do not drive, operate heavy machinery, perform activities at heights, swimming or participation in water activities or provide baby sitting services if your were admitted for syncope or siezures until you have seen by Primary MD or a Neurologist and advised to do so again.  Do not drive when taking Pain medications.   Procedures/Studies: ECHOCARDIOGRAM COMPLETE  Result Date: 04/28/2023    ECHOCARDIOGRAM REPORT   Patient Name:   LAKASHA BEATIE Date of Exam: 04/28/2023 Medical Rec #:  [id number]         Height:       58.0 in Accession #:    [id number]        Weight:       197.8 lb Date of Birth:  09/15/1955         BSA:          1.812 m Patient Age:    67 years          BP:           141/69 mmHg Patient Gender: F                 HR:           85 bpm. Exam Location:  Inpatient Procedure: 2D Echo, Cardiac Doppler and Color Doppler Indications:    CHF-acute systolic  History:        Patient has prior history of Echocardiogram examinations, most                 recent 03/15/2023. CHF, Signs/Symptoms:Murmur and Dyspnea; Risk                  Factors:Hypertension, Diabetes and Dyslipidemia.  Sonographer:    Vern Claude Referring Phys: [id number] SARA-MAIZ A THOMAS IMPRESSIONS  1. Left ventricular ejection fraction, by estimation, is 60 to 65%. The left ventricle has normal function. The left ventricle has no regional wall motion abnormalities. There is mild concentric left ventricular hypertrophy. Left ventricular diastolic parameters are consistent with Grade II diastolic dysfunction (pseudonormalization).  2. Right ventricular systolic function is normal. The right ventricular size is normal.  3. The mitral valve is normal in structure. Mild mitral valve regurgitation. No evidence of mitral stenosis.  4. The aortic valve is severely calcified but otherwise not well visualized. Aortic valve regurgitation is mild to moderate. Severe aortic valve stenosis with mean gradient of (manually measured) and AVA by VTI less than 1cm2.  5. The inferior vena cava is normal in size with greater than 50% respiratory variability, suggesting right atrial pressure of 3 mmHg. FINDINGS  Left Ventricle: Left ventricular ejection fraction, by estimation, is 60 to 65%. The left ventricle has normal function. The left ventricle has no regional wall motion abnormalities. The left ventricular internal cavity size was normal in size. There is  mild concentric left ventricular hypertrophy. Left ventricular diastolic parameters are consistent with Grade II diastolic dysfunction (pseudonormalization). Right Ventricle: The right ventricular size is normal. No increase in right ventricular wall thickness. Right ventricular systolic function is normal. Left Atrium: Left atrial size was normal in size. Right Atrium: Right atrial size was normal in size. Pericardium: There is no evidence of pericardial effusion. Mitral Valve: The mitral valve is normal in structure. Mild mitral valve regurgitation. No evidence of mitral valve stenosis. MV peak gradient,  6.8 mmHg. The mean mitral  valve gradient is 3.5 mmHg. Tricuspid Valve: The tricuspid valve is normal in structure. Tricuspid valve regurgitation is trivial. No evidence of tricuspid stenosis. Aortic Valve: The aortic valve is calcified. Aortic valve regurgitation is mild to moderate. Aortic regurgitation PHT measures 432 msec. Severe aortic stenosis is present. Aortic valve mean gradient measures 30.6 mmHg. Aortic valve peak gradient measures  69.0 mmHg. Aortic valve area, by VTI measures 0.93 cm. Pulmonic Valve: The pulmonic valve was normal in structure. Pulmonic valve regurgitation is not visualized. No evidence of pulmonic stenosis. Aorta: The aortic root is normal in size and structure. Venous: The inferior vena cava is normal in size with greater than 50% respiratory variability, suggesting right atrial pressure of 3 mmHg. IAS/Shunts: No atrial level shunt detected by color flow Doppler.  LEFT VENTRICLE PLAX 2D LVIDd:         3.40 cm      Diastology LVIDs:         1.80 cm      LV e' medial:    6.20 cm/s LV PW:         0.90 cm      LV E/e' medial:  20.6 LV IVS:        1.00 cm      LV e' lateral:   5.11 cm/s LVOT diam:     1.80 cm      LV E/e' lateral: 25.0 LV SV:         69 LV SV Index:   38 LVOT Area:     2.54 cm  LV Volumes (MOD) LV vol d, MOD A2C: 92.7 ml LV vol d, MOD A4C: 144.0 ml LV vol s, MOD A2C: 29.2 ml LV vol s, MOD A4C: 39.4 ml LV SV MOD A2C:     63.5 ml LV SV MOD A4C:     144.0 ml LV SV MOD BP:      94.9 ml RIGHT VENTRICLE             IVC RV Basal diam:  3.10 cm     IVC diam: 1.20 cm RV Mid diam:    2.70 cm RV S prime:     14.60 cm/s TAPSE (M-mode): 3.3 cm LEFT ATRIUM             Index        RIGHT ATRIUM          Index LA diam:        3.10 cm 1.71 cm/m   RA Area:     9.52 cm LA Vol (A2C):   34.0 ml 18.76 ml/m  RA Volume:   18.50 ml 10.21 ml/m LA Vol (A4C):   39.5 ml 21.79 ml/m LA Biplane Vol: 39.9 ml 22.02 ml/m  AORTIC VALVE                     PULMONIC VALVE AV Area (Vmax):    0.77 cm      PV Vmax:       1.25 m/s  AV Area (Vmean):   0.85 cm      PV Peak grad:  6.3 mmHg AV Area (VTI):     0.93 cm AV Vmax:           415.40 cm/s AV Vmean:          236.600 cm/s AV VTI:            0.747 m AV Peak Grad:  69.0 mmHg AV Mean Grad:      30.6 mmHg LVOT Vmax:         126.00 cm/s LVOT Vmean:        78.800 cm/s LVOT VTI:          0.273 m LVOT/AV VTI ratio: 0.37 AI PHT:            432 msec  AORTA Ao Root diam: 2.40 cm Ao Asc diam:  2.90 cm MITRAL VALVE MV Area (PHT): 3.60 cm     SHUNTS MV Area VTI:   2.12 cm     Systemic VTI:  0.27 m MV Peak grad:  6.8 mmHg     Systemic Diam: 1.80 cm MV Mean grad:  3.5 mmHg MV Vmax:       1.30 m/s MV Vmean:      83.6 cm/s MV Decel Time: 211 msec MV E velocity: 128.00 cm/s MV A velocity: 129.00 cm/s MV E/A ratio:  0.99 Aditya Sabharwal Electronically signed by Dorthula Nettles Signature Date/Time: 04/28/2023/11:29:58 AM    Final    DG Chest Portable 1 View  Result Date: 04/27/2023 CLINICAL DATA:  Shortness of breath EXAM: PORTABLE CHEST 1 VIEW COMPARISON:  03/09/2022 FINDINGS: Cardiomegaly with vascular congestion and probable small pleural effusions. Mild diffuse hazy pulmonary density, probably edema. No pneumothorax IMPRESSION: Cardiomegaly with vascular congestion and probable small pleural effusions. Mild diffuse hazy pulmonary density, probably edema. Electronically Signed   By: Jasmine Pang M.D.   On: 04/27/2023 19:54   CT CORONARY MORPH W/CTA COR W/SCORE W/CA W/CM &/OR WO/CM  Addendum Date: 04/19/2023   ADDENDUM REPORT: 04/19/2023 07:50 ADDENDUM: Please refer to separate TAVR dictation from examination performed same date. Electronically Signed   By: Simonne Come M.D.   On: 04/19/2023 07:50   Result Date: 04/19/2023 CLINICAL DATA:  Severe Aortic Stenosis. EXAM: Cardiac TAVR CT TECHNIQUE: A non-contrast, gated CT scan was obtained with axial slices of 3 mm through the heart for aortic valve calcium scoring. A 120 kV retrospective, gated, contrast cardiac scan was obtained. Gantry  rotation speed was 250 msecs and collimation was 0.6 mm. Nitroglycerin was not given. The 3D data set was reconstructed in 5% intervals of the 0-95% of the R-R cycle. Systolic and diastolic phases were analyzed on a dedicated workstation using MPR, MIP, and VRT modes. The patient received 100 cc of contrast. FINDINGS: Image quality: Excellent. Noise artifact is: Limited. Valve Morphology: Tricuspid aortic valve with severely calcified and thickened leaflets. Restricted leaflet motion in systole. Aortic Valve Calcium score: 1014 Aortic annular dimension: Phase assessed: 25% Annular area: 287 mm2 Annular perimeter: 62.1 mm Max diameter: 22.1 mm Min diameter: 16.8 mm Annular and subannular calcification: None. Membranous septum length: 7.5 mm Optimal coplanar projection: LAO 1 CRA 1 Coronary Artery Height above Annulus: Left Main: 8.3 mm Right Coronary: 13.1 mm Sinus of Valsalva Measurements: Non-coronary: 22.7 mm Right-coronary: 25.2 mm Left-coronary: 26.8 mm Sinus of Valsalva Height: Non-coronary: 13.5 mm Right-coronary: 17.1 mm Left-coronary: 14.4 mm Sinotubular Junction: 22.7 mm Ascending Thoracic Aorta: 28.6 mm Coronary Arteries: Normal coronary origin. Right dominance. The study was performed without use of NTG and is insufficient for plaque evaluation. Please refer to recent cardiac catheterization for coronary assessment. 3-vessel coronary calcifications. Cardiac Morphology: Right Atrium: Right atrial size is within normal limits. Right Ventricle: The right ventricular cavity is within normal limits. Left Atrium: Left atrial size is normal in size with no left atrial appendage filling defect. Left Ventricle: The ventricular cavity size  is within normal limits. Pulmonary arteries: Normal in size without proximal filling defect. Pulmonary veins: Normal pulmonary venous drainage. Pericardium: Normal thickness with no significant effusion or calcium present. Mitral Valve: The mitral valve is normal structure  without significant calcification. Extra-cardiac findings: See attached radiology report for non-cardiac structures. IMPRESSION: 1. Small aortic annulus (287 mm2). 2. Annular measurements support a 20 mm S3, but shallow left main coronary height. Sinus heights are not favorable for a 23 mm Evolut Pro. Structural heart team discussion recommended. 3. No significant annular or subannular calcifications. 4. Low left main coronary artery height, 8.3 mm. 5. Optimal Fluoroscopic Angle for Delivery: LAO 1 CRA 1 [place] T. Flora Lipps, MD Electronically Signed: By: Lennie Odor M.D. On: 04/18/2023 09:57   CT ANGIO ABDOMEN PELVIS  W & WO CONTRAST  Result Date: 04/18/2023 CLINICAL DATA:  Preoperative examination prior to TAVR EXAM: CT ANGIOGRAPHY CHEST, ABDOMEN AND PELVIS TECHNIQUE: Non-contrast CT of the chest was initially obtained. Multidetector CT imaging through the chest, abdomen and pelvis was performed using the standard protocol during bolus administration of intravenous contrast. Multiplanar reconstructed images and MIPs were obtained and reviewed to evaluate the vascular anatomy. RADIATION DOSE REDUCTION: This exam was performed according to the departmental dose-optimization program which includes automated exposure control, adjustment of the mA and/or kV according to patient size and/or use of iterative reconstruction technique. CONTRAST:  OMNIPAQUE IOHEXOL 350 MG/ML SOLN COMPARISON:  Lumbar spine CT-02/03/2022; lumbar spine MRI-03/31/2022 FINDINGS: CTA CHEST FINDINGS Vascular Findings: No evidence of thoracic aortic aneurysm or dissection with measurements as follows. Scattered atherosclerotic plaque within the thoracic aorta, not resulting in a hemodynamically significant stenosis. No perivascular stranding. Conventional configuration of the aortic arch. The branch vessels of the aortic arch appear patent throughout their imaged courses. The descending thoracic aorta is of normal caliber and widely  patent without a hemodynamically significant narrowing. Cardiomegaly. Coronary artery calcifications. Calcifications involving the aortic valve leaflets. No pericardial effusion. Although this examination was not tailored for the evaluation the pulmonary arteries, there are no discrete filling defects within the central pulmonary arterial tree to suggest central pulmonary embolism. Normal caliber of the main pulmonary artery. ------------------------------------------------------------- Thoracic aortic measurements: SINOTUBULAR JUNCTION: 23 mm as measured in greatest oblique short axis coronal dimension. PROXIMAL ASCENDING THORACIC AORTA: 29 mm as measured in greatest oblique short axis axial dimension (axial image 54, series 6) at the level of the main pulmonary artery and approximately 29 mm as measured in greatest oblique short axis coronal dimension (coronal image 91, series 8) AORTIC ARCH: 22 mm as measured in greatest oblique short axis sagittal dimension. PROXIMAL DESCENDING THORACIC AORTA: 24 mm as measured in greatest oblique short axis axial dimension at the level of the main pulmonary artery. DISTAL DESCENDING THORACIC AORTA: 22 mm as measured in greatest oblique short axis axial dimension at the level of the diaphragmatic hiatus. Review of the MIP images confirms the above findings. ------------------------------------------------------------- Non-Vascular Findings: Mediastinum/Lymph Nodes: Mildly prominent though non pathologically enlarged bilateral axillary lymph nodes, presumably reactive in etiology. No bulky mediastinal, hilar or axillary lymphadenopathy Lungs/Pleura: Minimal dependent subpleural ground-glass atelectasis. No discrete focal airspace opacities. No air bronchograms. No pleural effusion or pneumothorax. The central pulmonary airways appear widely patent. No discrete pulmonary nodules. Musculoskeletal: Regional soft tissues appear normal. Normal appearance of the thyroid gland.  _________________________________________________________ _________________________________________________________ CTA ABDOMEN AND PELVIS FINDINGS VASCULAR Aorta: Scattered atherosclerotic plaque within a normal caliber abdominal aorta, not resulting in a hemodynamically significant narrowing. No evidence of abdominal aortic  dissection or perivascular stranding. Celiac: Widely patent without hemodynamically significant narrowing. Conventional branching pattern. SMA: Widely patent without hemodynamically significant narrowing. Conventional branching pattern. The distal tributaries of the SMA appear widely patent without discrete intraluminal filling defect to suggest distal embolism. Renals: Solitary bilaterally; the bilateral renal arteries are widely patent without a hemodynamically significant narrowing. No vessel irregularity to suggest FMD. IMA: Widely patent without hemodynamically significant narrowing Inflow: The bilateral common, external and internal iliac arteries are of normal caliber and widely patent without hemodynamically significant narrowing. Veins: The IVC and pelvic venous systems appear patent on this arterial phase examination Review of the MIP images confirms the above findings. _________________________________________________________ NON-VASCULAR Evaluation of the abdominal organs is limited to the arterial phase of enhancement. Hepatobiliary: Normal hepatic contour. No discrete enhancing hepatic lesions. There is a 1.6 cm radiopaque gallstone within otherwise normal-appearing gallbladder. No gallbladder wall thickening or pericholecystic stranding. No intra or extrahepatic biliary ductal dilatation. No ascites. Pancreas: Normal appearance of the pancreas. Spleen: Normal early arterial phase appearance of the spleen. Adrenals/Urinary Tract: There is symmetric enhancement of the bilateral kidneys. No evidence of nephrolithiasis on this postcontrast examination. No discrete renal lesions.  There is a minimal amount of age and body habitus related perinephric stranding. No urinary obstruction Normal appearance of the bilateral adrenal glands. Normal appearance of the urinary bladder given degree of distention. Stomach/Bowel: Scattered colonic diverticulosis without evidence superimposed acute diverticulitis. Large colonic stool burden without evidence of enteric obstruction. Normal appearance of the terminal ileum and the retrocecal appendix. Small hiatal hernia. No pneumoperitoneum, pneumatosis or portal venous gas. Lymphatic: No bulky retroperitoneal, mesenteric, pelvic or inguinal lymph adenopathy. Reproductive: Normal appearance of the pelvic organs. No free fluid in the pelvic cul-de-sac. Other: Subcutaneous edema about the midline of the low back. Musculoskeletal: No acute or aggressive osseous abnormalities. Post L2-S1 paraspinal fusion with intervertebral disc space cage device is seen at L4-L5 and L5-S1. Grade 1 anterolisthesis of L3 upon L4. There is apparent lucency surrounding the bilateral L2 pedicular screws, as could be seen in the setting of hardware loosening. Review of the MIP images confirms the above findings. IMPRESSION: Vascular Impression: 1. No evidence of thoracic or abdominal aortic aneurysm or dissection. 2. Cardiomegaly with dystrophic calcifications involving the aortic valve leaflets compatible with indication of impending TAVR. 3. Coronary artery calcifications. Aortic Atherosclerosis (ICD10-I70.0). Nonvascular Impression: 1. Cholelithiasis without evidence superimposed acute cholecystitis. 2. Small hiatal hernia. 3. Post L2-S1 paraspinal fusion with apparent lucency surrounding the bilateral L2 pedicular screws, as could be seen in the setting of hardware loosening. Clinical correlation is advised. Electronically Signed   By: Simonne Come M.D.   On: 04/18/2023 16:57   CT ANGIO CHEST AORTA W/CM & OR WO/CM  Result Date: 04/18/2023 CLINICAL DATA:  Preoperative  examination prior to TAVR EXAM: CT ANGIOGRAPHY CHEST, ABDOMEN AND PELVIS TECHNIQUE: Non-contrast CT of the chest was initially obtained. Multidetector CT imaging through the chest, abdomen and pelvis was performed using the standard protocol during bolus administration of intravenous contrast. Multiplanar reconstructed images and MIPs were obtained and reviewed to evaluate the vascular anatomy. RADIATION DOSE REDUCTION: This exam was performed according to the departmental dose-optimization program which includes automated exposure control, adjustment of the mA and/or kV according to patient size and/or use of iterative reconstruction technique. CONTRAST:  OMNIPAQUE IOHEXOL 350 MG/ML SOLN COMPARISON:  Lumbar spine CT-02/03/2022; lumbar spine MRI-03/31/2022 FINDINGS: CTA CHEST FINDINGS Vascular Findings: No evidence of thoracic aortic aneurysm or dissection with measurements as follows.  Scattered atherosclerotic plaque within the thoracic aorta, not resulting in a hemodynamically significant stenosis. No perivascular stranding. Conventional configuration of the aortic arch. The branch vessels of the aortic arch appear patent throughout their imaged courses. The descending thoracic aorta is of normal caliber and widely patent without a hemodynamically significant narrowing. Cardiomegaly. Coronary artery calcifications. Calcifications involving the aortic valve leaflets. No pericardial effusion. Although this examination was not tailored for the evaluation the pulmonary arteries, there are no discrete filling defects within the central pulmonary arterial tree to suggest central pulmonary embolism. Normal caliber of the main pulmonary artery. ------------------------------------------------------------- Thoracic aortic measurements: SINOTUBULAR JUNCTION: 23 mm as measured in greatest oblique short axis coronal dimension. PROXIMAL ASCENDING THORACIC AORTA: 29 mm as measured in greatest oblique short axis axial  dimension (axial image 54, series 6) at the level of the main pulmonary artery and approximately 29 mm as measured in greatest oblique short axis coronal dimension (coronal image 91, series 8) AORTIC ARCH: 22 mm as measured in greatest oblique short axis sagittal dimension. PROXIMAL DESCENDING THORACIC AORTA: 24 mm as measured in greatest oblique short axis axial dimension at the level of the main pulmonary artery. DISTAL DESCENDING THORACIC AORTA: 22 mm as measured in greatest oblique short axis axial dimension at the level of the diaphragmatic hiatus. Review of the MIP images confirms the above findings. ------------------------------------------------------------- Non-Vascular Findings: Mediastinum/Lymph Nodes: Mildly prominent though non pathologically enlarged bilateral axillary lymph nodes, presumably reactive in etiology. No bulky mediastinal, hilar or axillary lymphadenopathy Lungs/Pleura: Minimal dependent subpleural ground-glass atelectasis. No discrete focal airspace opacities. No air bronchograms. No pleural effusion or pneumothorax. The central pulmonary airways appear widely patent. No discrete pulmonary nodules. Musculoskeletal: Regional soft tissues appear normal. Normal appearance of the thyroid gland. _________________________________________________________ _________________________________________________________ CTA ABDOMEN AND PELVIS FINDINGS VASCULAR Aorta: Scattered atherosclerotic plaque within a normal caliber abdominal aorta, not resulting in a hemodynamically significant narrowing. No evidence of abdominal aortic dissection or perivascular stranding. Celiac: Widely patent without hemodynamically significant narrowing. Conventional branching pattern. SMA: Widely patent without hemodynamically significant narrowing. Conventional branching pattern. The distal tributaries of the SMA appear widely patent without discrete intraluminal filling defect to suggest distal embolism. Renals: Solitary  bilaterally; the bilateral renal arteries are widely patent without a hemodynamically significant narrowing. No vessel irregularity to suggest FMD. IMA: Widely patent without hemodynamically significant narrowing Inflow: The bilateral common, external and internal iliac arteries are of normal caliber and widely patent without hemodynamically significant narrowing. Veins: The IVC and pelvic venous systems appear patent on this arterial phase examination Review of the MIP images confirms the above findings. _________________________________________________________ NON-VASCULAR Evaluation of the abdominal organs is limited to the arterial phase of enhancement. Hepatobiliary: Normal hepatic contour. No discrete enhancing hepatic lesions. There is a 1.6 cm radiopaque gallstone within otherwise normal-appearing gallbladder. No gallbladder wall thickening or pericholecystic stranding. No intra or extrahepatic biliary ductal dilatation. No ascites. Pancreas: Normal appearance of the pancreas. Spleen: Normal early arterial phase appearance of the spleen. Adrenals/Urinary Tract: There is symmetric enhancement of the bilateral kidneys. No evidence of nephrolithiasis on this postcontrast examination. No discrete renal lesions. There is a minimal amount of age and body habitus related perinephric stranding. No urinary obstruction Normal appearance of the bilateral adrenal glands. Normal appearance of the urinary bladder given degree of distention. Stomach/Bowel: Scattered colonic diverticulosis without evidence superimposed acute diverticulitis. Large colonic stool burden without evidence of enteric obstruction. Normal appearance of the terminal ileum and the retrocecal appendix. Small hiatal hernia. No pneumoperitoneum, pneumatosis or  portal venous gas. Lymphatic: No bulky retroperitoneal, mesenteric, pelvic or inguinal lymph adenopathy. Reproductive: Normal appearance of the pelvic organs. No free fluid in the pelvic  cul-de-sac. Other: Subcutaneous edema about the midline of the low back. Musculoskeletal: No acute or aggressive osseous abnormalities. Post L2-S1 paraspinal fusion with intervertebral disc space cage device is seen at L4-L5 and L5-S1. Grade 1 anterolisthesis of L3 upon L4. There is apparent lucency surrounding the bilateral L2 pedicular screws, as could be seen in the setting of hardware loosening. Review of the MIP images confirms the above findings. IMPRESSION: Vascular Impression: 1. No evidence of thoracic or abdominal aortic aneurysm or dissection. 2. Cardiomegaly with dystrophic calcifications involving the aortic valve leaflets compatible with indication of impending TAVR. 3. Coronary artery calcifications. Aortic Atherosclerosis (ICD10-I70.0). Nonvascular Impression: 1. Cholelithiasis without evidence superimposed acute cholecystitis. 2. Small hiatal hernia. 3. Post L2-S1 paraspinal fusion with apparent lucency surrounding the bilateral L2 pedicular screws, as could be seen in the setting of hardware loosening. Clinical correlation is advised. Electronically Signed   By: Simonne Come M.D.   On: 04/18/2023 16:57   CARDIAC CATHETERIZATION  Result Date: 04/06/2023 1.  Severe aortic stenosis with mean transvalvular gradient of 40 mmHg 2.  Widely patent coronary arteries with minimal irregularities and no significant stenosis Recommendations: Continue evaluation for aortic valve replacement     The results of significant diagnostics from this hospitalization (including imaging, microbiology, ancillary and laboratory) are listed below for reference.     Microbiology: Recent Results (from the past 240 hour(s))  Respiratory (~20 pathogens) panel by PCR     Status: None   Collection Time: 04/28/23  2:28 AM   Specimen: Nasopharyngeal Swab; Respiratory  Result Value Ref Range Status   Adenovirus NOT DETECTED NOT DETECTED Final   Coronavirus 229E NOT DETECTED NOT DETECTED Final    Comment: (NOTE) The  Coronavirus on the Respiratory Panel, DOES NOT test for the novel  Coronavirus (2019 nCoV)    Coronavirus HKU1 NOT DETECTED NOT DETECTED Final   Coronavirus NL63 NOT DETECTED NOT DETECTED Final   Coronavirus OC43 NOT DETECTED NOT DETECTED Final   Metapneumovirus NOT DETECTED NOT DETECTED Final   Rhinovirus / Enterovirus NOT DETECTED NOT DETECTED Final   Influenza A NOT DETECTED NOT DETECTED Final   Influenza B NOT DETECTED NOT DETECTED Final   Parainfluenza Virus 1 NOT DETECTED NOT DETECTED Final   Parainfluenza Virus 2 NOT DETECTED NOT DETECTED Final   Parainfluenza Virus 3 NOT DETECTED NOT DETECTED Final   Parainfluenza Virus 4 NOT DETECTED NOT DETECTED Final   Respiratory Syncytial Virus NOT DETECTED NOT DETECTED Final   Bordetella pertussis NOT DETECTED NOT DETECTED Final   Bordetella Parapertussis NOT DETECTED NOT DETECTED Final   Chlamydophila pneumoniae NOT DETECTED NOT DETECTED Final   Mycoplasma pneumoniae NOT DETECTED NOT DETECTED Final    Comment: Performed at [facility], [address] [place], [place] [address]     Labs: BNP (last 3 results) Recent Labs    04/27/23 1845 04/28/23 0246  BNP 134.9* 162.1*   Basic Metabolic Panel: Recent Labs  Lab 04/26/23 1053 04/27/23 1845 04/28/23 0246 04/29/23 0257 04/30/23 0217  NA 141 140 140 141 138  K 4.8 4.1 3.9 3.7 4.0  CL 103 103 102 101 99  CO2 30 26 28 27 25   GLUCOSE 117* 143* 121* 120* 125*  BUN 23 25* 21 26* 39*  CREATININE 1.40* 1.34* 1.30* 1.45* 1.83*  CALCIUM 9.8 10.4* 9.6 9.9 9.6  MG  --   --  2.1 2.0 2.0  PHOS  --   --   --  5.1*  --    Liver Function Tests: Recent Labs  Lab 04/26/23 1053 04/28/23 0246  AST 17 17  ALT 14 18  ALKPHOS  --  67  BILITOT 0.3 0.5  PROT 7.3 7.4  ALBUMIN  --  4.1   No results for input(s): "LIPASE", "AMYLASE" in the last 168 hours. No results for input(s): "AMMONIA" in the last 168 hours. CBC: Recent Labs  Lab 04/26/23 1053 04/27/23 1845 04/28/23 0246  04/29/23 0257 04/30/23 0217  WBC 6.5 11.2* 8.2 7.5 8.9  NEUTROABS 3,634 6.1 4.9  --   --   HGB 9.2* 9.8* 9.0* 9.0* 9.3*  HCT 30.4* 32.4* 30.3* 29.7* 30.4*  MCV 78.1* 80.2 79.5* 79.0* 78.1*  PLT 319 371 322 325 326   Cardiac Enzymes: No results for input(s): "CKTOTAL", "CKMB", "CKMBINDEX", "TROPONINI" in the last 168 hours. BNP: Invalid input(s): "POCBNP" CBG: Recent Labs  Lab 04/29/23 0627 04/29/23 1107 04/29/23 1609 04/29/23 2056 04/30/23 0610  GLUCAP 134* 114* 81 117* 96   D-Dimer No results for input(s): "DDIMER" in the last 72 hours. Hgb A1c Recent Labs    04/28/23 0246  HGBA1C 6.2*   Lipid Profile No results for input(s): "CHOL", "HDL", "LDLCALC", "TRIG", "CHOLHDL", "LDLDIRECT" in the last 72 hours. Thyroid function studies Recent Labs    04/28/23 0246  TSH 4.110   Anemia work up Recent Labs    04/28/23 0925  VITAMINB12 282  FOLATE 15.0  FERRITIN 35  TIBC 389  IRON 29  RETICCTPCT 2.2   Urinalysis    Component Value Date/Time   COLORURINE YELLOW 02/03/2022 0815   APPEARANCEUR CLEAR 02/03/2022 0815   LABSPEC 1.013 02/03/2022 0815   PHURINE 5.0 02/03/2022 0815   GLUCOSEU NEGATIVE 02/03/2022 0815   HGBUR MODERATE (A) 02/03/2022 0815   BILIRUBINUR NEGATIVE 02/03/2022 0815   BILIRUBINUR negative 02/28/2017 1709   BILIRUBINUR neg 07/21/2013 1139   KETONESUR NEGATIVE 02/03/2022 0815   PROTEINUR 30 (A) 02/03/2022 0815   UROBILINOGEN 0.2 02/28/2017 1709   NITRITE NEGATIVE 02/03/2022 0815   LEUKOCYTESUR NEGATIVE 02/03/2022 0815   Sepsis Labs Recent Labs  Lab 04/27/23 1845 04/28/23 0246 04/29/23 0257 04/30/23 0217  WBC 11.2* 8.2 7.5 8.9   Microbiology Recent Results (from the past 240 hour(s))  Respiratory (~20 pathogens) panel by PCR     Status: None   Collection Time: 04/28/23  2:28 AM   Specimen: Nasopharyngeal Swab; Respiratory  Result Value Ref Range Status   Adenovirus NOT DETECTED NOT DETECTED Final   Coronavirus 229E NOT DETECTED  NOT DETECTED Final    Comment: (NOTE) The Coronavirus on the Respiratory Panel, DOES NOT test for the novel  Coronavirus (2019 nCoV)    Coronavirus HKU1 NOT DETECTED NOT DETECTED Final   Coronavirus NL63 NOT DETECTED NOT DETECTED Final   Coronavirus OC43 NOT DETECTED NOT DETECTED Final   Metapneumovirus NOT DETECTED NOT DETECTED Final   Rhinovirus / Enterovirus NOT DETECTED NOT DETECTED Final   Influenza A NOT DETECTED NOT DETECTED Final   Influenza B NOT DETECTED NOT DETECTED Final   Parainfluenza Virus 1 NOT DETECTED NOT DETECTED Final   Parainfluenza Virus 2 NOT DETECTED NOT DETECTED Final   Parainfluenza Virus 3 NOT DETECTED NOT DETECTED Final   Parainfluenza Virus 4 NOT DETECTED NOT DETECTED Final   Respiratory Syncytial Virus NOT DETECTED NOT DETECTED Final   Bordetella pertussis NOT DETECTED  NOT DETECTED Final   Bordetella Parapertussis NOT DETECTED NOT DETECTED Final   Chlamydophila pneumoniae NOT DETECTED NOT DETECTED Final   Mycoplasma pneumoniae NOT DETECTED NOT DETECTED Final    Comment: Performed at [facility], [address] [place], [place] [address]     Time coordinating discharge:  I have spent 35 minutes face to face with the patient and on the ward discussing the patients care, assessment, plan and disposition with other care givers. >50% of the time was devoted counseling the patient about the risks and benefits of treatment/Discharge disposition and coordinating care.   SIGNED:   Miguel Rota, MD  [facility] 04/30/2023, 11:14 AM   If 7PM-7AM, please contact night-coverage

## 2023-04-30 NOTE — Plan of Care (Signed)
Care plan reviewed and adequate for discharge.

## 2023-04-30 NOTE — Progress Notes (Signed)
Subjective:  Denies SSCP, palpitations or Dyspnea Feels better after lasix Ok for d/c   Objective:  Vitals:   04/29/23 0807 04/29/23 1957 04/30/23 0606 04/30/23 0750  BP: 116/69 (!) 125/57 129/64 (!) 127/52  Pulse: 80 76 76 81  Resp: 18 18 18 16   Temp: 98.1 F (36.7 C) 98.3 F (36.8 C) 98 F (36.7 C) 98.4 F (36.9 C)  TempSrc: Oral Oral Oral Oral  SpO2: 95% 97% 98% 95%  Weight:   87.4 kg   Height:        Intake/Output from previous day:  Intake/Output Summary (Last 24 hours) at 04/30/2023 4098 Last data filed at 04/30/2023 0700 Gross per 24 hour  Intake --  Output 200 ml  Net -200 ml    Physical Exam: Obese black female Lungs basilar atelectasis AS murmur with diminished S2 Abdomen benign Plus one edema  Lab Results: Basic Metabolic Panel: Recent Labs    04/29/23 0257 04/30/23 0217  NA 141 138  K 3.7 4.0  CL 101 99  CO2 27 25  GLUCOSE 120* 125*  BUN 26* 39*  CREATININE 1.45* 1.83*  CALCIUM 9.9 9.6  MG 2.0 2.0  PHOS 5.1*  --    Liver Function Tests: Recent Labs    04/28/23 0246  AST 17  ALT 18  ALKPHOS 67  BILITOT 0.5  PROT 7.4  ALBUMIN 4.1   No results for input(s): "LIPASE", "AMYLASE" in the last 72 hours. CBC: Recent Labs    04/27/23 1845 04/28/23 0246 04/29/23 0257 04/30/23 0217  WBC 11.2* 8.2 7.5 8.9  NEUTROABS 6.1 4.9  --   --   HGB 9.8* 9.0* 9.0* 9.3*  HCT 32.4* 30.3* 29.7* 30.4*  MCV 80.2 79.5* 79.0* 78.1*  PLT 371 322 325 326   Cardiac Enzymes: No results for input(s): "CKTOTAL", "CKMB", "CKMBINDEX", "TROPONINI" in the last 72 hours. BNP: Invalid input(s): "POCBNP" D-Dimer: No results for input(s): "DDIMER" in the last 72 hours. Hemoglobin A1C: Recent Labs    04/28/23 0246  HGBA1C 6.2*   Fasting Lipid Panel: Recent Labs    04/27/23 1114  LDLDIRECT 106*   Thyroid Function Tests: Recent Labs    04/28/23 0246  TSH 4.110   Anemia Panel: Recent Labs    04/28/23 0925  VITAMINB12 282  FOLATE 15.0   FERRITIN 35  TIBC 389  IRON 29  RETICCTPCT 2.2    Imaging: ECHOCARDIOGRAM COMPLETE  Result Date: 04/28/2023    ECHOCARDIOGRAM REPORT   Patient Name:   [name] Date of Exam: 04/28/2023 Medical Rec #:  [id number]         Height:       58.0 in Accession #:    [id number]        Weight:       197.8 lb Date of Birth:  [birth year]         BSA:          1.812 m Patient Age:    67 years          BP:           141/69 mmHg Patient Gender: F                 HR:           85 bpm. Exam Location:  Inpatient Procedure: 2D Echo, Cardiac Doppler and Color Doppler Indications:    CHF-acute systolic  History:        Patient has prior history of  Echocardiogram examinations, most                 recent 03/15/2023. CHF, Signs/Symptoms:Murmur and Dyspnea; Risk                 Factors:Hypertension, Diabetes and Dyslipidemia.  Sonographer:    Vern Claude Referring Phys: [id number] SARA-MAIZ A THOMAS IMPRESSIONS  1. Left ventricular ejection fraction, by estimation, is 60 to 65%. The left ventricle has normal function. The left ventricle has no regional wall motion abnormalities. There is mild concentric left ventricular hypertrophy. Left ventricular diastolic parameters are consistent with Grade II diastolic dysfunction (pseudonormalization).  2. Right ventricular systolic function is normal. The right ventricular size is normal.  3. The mitral valve is normal in structure. Mild mitral valve regurgitation. No evidence of mitral stenosis.  4. The aortic valve is severely calcified but otherwise not well visualized. Aortic valve regurgitation is mild to moderate. Severe aortic valve stenosis with mean gradient of (manually measured) and AVA by VTI less than 1cm2.  5. The inferior vena cava is normal in size with greater than 50% respiratory variability, suggesting right atrial pressure of 3 mmHg. FINDINGS  Left Ventricle: Left ventricular ejection fraction, by estimation, is 60 to 65%. The left ventricle has normal  function. The left ventricle has no regional wall motion abnormalities. The left ventricular internal cavity size was normal in size. There is  mild concentric left ventricular hypertrophy. Left ventricular diastolic parameters are consistent with Grade II diastolic dysfunction (pseudonormalization). Right Ventricle: The right ventricular size is normal. No increase in right ventricular wall thickness. Right ventricular systolic function is normal. Left Atrium: Left atrial size was normal in size. Right Atrium: Right atrial size was normal in size. Pericardium: There is no evidence of pericardial effusion. Mitral Valve: The mitral valve is normal in structure. Mild mitral valve regurgitation. No evidence of mitral valve stenosis. MV peak gradient, 6.8 mmHg. The mean mitral valve gradient is 3.5 mmHg. Tricuspid Valve: The tricuspid valve is normal in structure. Tricuspid valve regurgitation is trivial. No evidence of tricuspid stenosis. Aortic Valve: The aortic valve is calcified. Aortic valve regurgitation is mild to moderate. Aortic regurgitation PHT measures 432 msec. Severe aortic stenosis is present. Aortic valve mean gradient measures 30.6 mmHg. Aortic valve peak gradient measures  69.0 mmHg. Aortic valve area, by VTI measures 0.93 cm. Pulmonic Valve: The pulmonic valve was normal in structure. Pulmonic valve regurgitation is not visualized. No evidence of pulmonic stenosis. Aorta: The aortic root is normal in size and structure. Venous: The inferior vena cava is normal in size with greater than 50% respiratory variability, suggesting right atrial pressure of 3 mmHg. IAS/Shunts: No atrial level shunt detected by color flow Doppler.  LEFT VENTRICLE PLAX 2D LVIDd:         3.40 cm      Diastology LVIDs:         1.80 cm      LV e' medial:    6.20 cm/s LV PW:         0.90 cm      LV E/e' medial:  20.6 LV IVS:        1.00 cm      LV e' lateral:   5.11 cm/s LVOT diam:     1.80 cm      LV E/e' lateral: 25.0 LV SV:          69 LV SV Index:   38 LVOT Area:     2.54  cm  LV Volumes (MOD) LV vol d, MOD A2C: 92.7 ml LV vol d, MOD A4C: 144.0 ml LV vol s, MOD A2C: 29.2 ml LV vol s, MOD A4C: 39.4 ml LV SV MOD A2C:     63.5 ml LV SV MOD A4C:     144.0 ml LV SV MOD BP:      94.9 ml RIGHT VENTRICLE             IVC RV Basal diam:  3.10 cm     IVC diam: 1.20 cm RV Mid diam:    2.70 cm RV S prime:     14.60 cm/s TAPSE (M-mode): 3.3 cm LEFT ATRIUM             Index        RIGHT ATRIUM          Index LA diam:        3.10 cm 1.71 cm/m   RA Area:     9.52 cm LA Vol (A2C):   34.0 ml 18.76 ml/m  RA Volume:   18.50 ml 10.21 ml/m LA Vol (A4C):   39.5 ml 21.79 ml/m LA Biplane Vol: 39.9 ml 22.02 ml/m  AORTIC VALVE                     PULMONIC VALVE AV Area (Vmax):    0.77 cm      PV Vmax:       1.25 m/s AV Area (Vmean):   0.85 cm      PV Peak grad:  6.3 mmHg AV Area (VTI):     0.93 cm AV Vmax:           415.40 cm/s AV Vmean:          236.600 cm/s AV VTI:            0.747 m AV Peak Grad:      69.0 mmHg AV Mean Grad:      30.6 mmHg LVOT Vmax:         126.00 cm/s LVOT Vmean:        78.800 cm/s LVOT VTI:          0.273 m LVOT/AV VTI ratio: 0.37 AI PHT:            432 msec  AORTA Ao Root diam: 2.40 cm Ao Asc diam:  2.90 cm MITRAL VALVE MV Area (PHT): 3.60 cm     SHUNTS MV Area VTI:   2.12 cm     Systemic VTI:  0.27 m MV Peak grad:  6.8 mmHg     Systemic Diam: 1.80 cm MV Mean grad:  3.5 mmHg MV Vmax:       1.30 m/s MV Vmean:      83.6 cm/s MV Decel Time: 211 msec MV E velocity: 128.00 cm/s MV A velocity: 129.00 cm/s MV E/A ratio:  0.99 Aditya Sabharwal Electronically signed by Dorthula Nettles Signature Date/Time: 04/28/2023/11:29:58 AM    Final     Cardiac Studies:  ECG: ST poor R wave progression    Telemetry:  NSR   Echo: 12/5 EF 60-65% mild/mod AR severe AS mean gradient 44 mmHg   Medications:    amLODipine  10 mg Oral Daily   carvedilol  25 mg Oral BID   docusate sodium  100 mg Oral BID   furosemide  40 mg Intravenous Q12H    gabapentin  300 mg Oral TID   heparin  5,000 Units Subcutaneous Q8H  insulin aspart  0-6 Units Subcutaneous TID WC   pantoprazole  40 mg Oral Daily   rosuvastatin  10 mg Oral QODAY   sulfamethoxazole-trimethoprim  1 tablet Oral BID      Assessment/Plan:   Severe AS:  difficult situation Need TAVR/SAVR. Presents with CHF improved with lasix K 3.7 supplement Cr up a bit 1.45 give lasix 40 mg iv today change to PO in am. Has appointment with Dr Leafy Ro 12/12 She is young and would benefit from [facility] but very poor functional status due to chronic back issues. CTA shows not ideal candidate for TAVR Can only fit 20 mm Sapien valve which would lead to patient prosthetic mis-match and sinuses small for 23 mm Medtronic valve No CAD on cath 04/06/23 Will be discussed at structural meeting 12/10 and then see surgeon.  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

## 2023-05-03 ENCOUNTER — Other Ambulatory Visit: Payer: Self-pay

## 2023-05-03 DIAGNOSIS — I350 Nonrheumatic aortic (valve) stenosis: Secondary | ICD-10-CM

## 2023-05-03 NOTE — Progress Notes (Unsigned)
[address]       [place] [address]             [phone]           SHANDALYN MUNSEN [facility] Medical Record #[id number] Date of Birth: 09/19/1955  Wendall Stade, MD [name], MD  Chief Complaint:   DOE and AS  History of Present Illness:     Pt is a 67 yo female who has been suffering from DOE and has been found to have severe AS with a mean gradient of and EF of 70%. Pt recently admitted with CHF at [facility] and was discharged. She has had a work up with echo and cath (no CAD) and on CTA work up was found to have very small annulus which is prohibitive for a sapien valve and for a medtronic would risk coronary obstruction. She is here to discuss options. She has had back surgeries which needed hardware removal and now walks with a cane at home      Past Medical History:  Diagnosis Date   Allergy    Anemia    Anxiety    Arthritis    Asthma    BMI 40.0-44.9, adult (HCC) 04/07/2014   DM type 2 (diabetes mellitus, type 2) (HCC)    GERD (gastroesophageal reflux disease)    HTN (hypertension)    Hypercholesterolemia    Osteoarthritis of left hip 04/07/2014   Reflux    Severe aortic stenosis    Spinal headache    with C-Section and with spinal fusion in 2019    Past Surgical History:  Procedure Laterality Date   CESAREAN SECTION     x3   COLONOSCOPY     LAMINECTOMY  03/2021   LEFT HEART CATH AND CORONARY ANGIOGRAPHY N/A 04/06/2023   Procedure: LEFT HEART CATH AND CORONARY ANGIOGRAPHY;  Surgeon: Tonny Bollman, MD;  Location: [facility] INVASIVE CV LAB;  Service: Cardiovascular;  Laterality: N/A;   SPINAL FUSION  2019   TUBAL LIGATION      Social History   Tobacco Use  Smoking Status Never  Smokeless Tobacco Never    Social History   Substance and Sexual Activity  Alcohol Use No   Alcohol/week: 0.0 standard drinks of alcohol    Social History   Socioeconomic History   Marital status: Married    Spouse name: Not  on file   Number of children: Not on file   Years of education: Not on file   Highest education level: Not on file  Occupational History   Not on file  Tobacco Use   Smoking status: Never   Smokeless tobacco: Never  Vaping Use   Vaping status: Never Used  Substance and Sexual Activity   Alcohol use: No    Alcohol/week: 0.0 standard drinks of alcohol   Drug use: No   Sexual activity: Yes  Other Topics Concern   Not on file  Social History Narrative   Not on file   Social Determinants of Health   Financial Resource Strain: Low Risk  (04/13/2023)   Received from [facility]   Overall Financial Resource Strain (CARDIA)    Difficulty of Paying Living Expenses: Not hard at all  Food Insecurity: No Food Insecurity (04/28/2023)   Hunger Vital Sign    Worried About Running Out of Food in the Last Year: Never true    Ran Out of Food in the Last Year: Never true  Transportation  Needs: No Transportation Needs (04/28/2023)   PRAPARE - Administrator, Civil Service (Medical): No    Lack of Transportation (Non-Medical): No  Physical Activity: Inactive (04/13/2023)   Received from [facility]   Exercise Vital Sign    Days of Exercise per Week: 0 days    Minutes of Exercise per Session: 30 min  Stress: No Stress Concern Present (04/13/2023)   Received from [facility] - Occupational Stress Questionnaire    Feeling of Stress : Only a little  Social Connections: Somewhat Isolated (04/13/2023)   Received from [facility]   Social Network    How would you rate your social network (family, work, friends)?: Restricted participation with some degree of social isolation  Intimate Partner Violence: Not At Risk (04/28/2023)   Humiliation, Afraid, Rape, and Kick questionnaire    Fear of Current or Ex-Partner: No    Emotionally Abused: No    Physically Abused: No    Sexually Abused: No    Allergies  Allergen Reactions   Crestor  [Rosuvastatin] Other (See Comments)    Myalgia    Robaxin [Methocarbamol] Other (See Comments)    Insomnia   Toradol [Ketorolac Tromethamine] Other (See Comments)   Zocor [Simvastatin] Other (See Comments)    Myalgias    Lipitor [Atorvastatin] Other (See Comments)    Myalgia   Sulfa Antibiotics Hives    Current Outpatient Medications  Medication Sig Dispense Refill   acetaminophen (TYLENOL) 500 MG tablet Take 1,000 mg by mouth every 6 (six) hours as needed for mild pain (pain score 1-3) or headache.     albuterol (PROAIR HFA) 108 (90 BASE) MCG/ACT inhaler Inhale 2 puffs into the lungs every 6 (six) hours as needed. 1 Inhaler 12   amLODipine (NORVASC) 10 MG tablet Take 1 tablet (10 mg total) by mouth daily. (Patient taking differently: Take 10 mg by mouth at bedtime.) 90 tablet 1   ascorbic acid (VITAMIN C) 500 MG tablet Take 500 mg by mouth daily.     carvedilol (COREG) 25 MG tablet Take 1 tablet (25 mg total) by mouth 2 (two) times daily. (Patient not taking: Reported on 04/28/2023) 60 tablet 0   celecoxib (CELEBREX) 200 MG capsule Take 200 mg by mouth 2 (two) times daily.   1   cetirizine (ZYRTEC) 10 MG tablet Take 1 tablet (10 mg total) by mouth daily. 30 tablet 0   docusate sodium (COLACE) 100 MG capsule Take 200-300 mg by mouth at bedtime.     fluticasone (FLONASE) 50 MCG/ACT nasal spray Place 2 sprays into both nostrils daily. (Patient taking differently: Place 2 sprays into both nostrils daily as needed for rhinitis.) 16 g 0   furosemide (LASIX) 20 MG tablet Take 2 tablets (40 mg total) by mouth daily. 30 tablet 0   gabapentin (NEURONTIN) 600 MG tablet Take 600 mg by mouth 3 (three) times daily.     iron polysaccharides (NIFEREX) 150 MG capsule TAKE 1 CAPSULE (150 MG DOSE) BY MOUTH TWICE A DAY (Patient taking differently: Take 150 mg by mouth daily.) 180 capsule 1   losartan-hydrochlorothiazide (HYZAAR) 100-25 MG tablet Take 1 tablet by mouth daily.     metFORMIN (GLUCOPHAGE) 1000 MG  tablet Take 1 tablet (1,000 mg total) by mouth 2 (two) times daily with a meal. (Patient taking differently: Take 1,000 mg by mouth daily.) 180 tablet 1   pantoprazole (PROTONIX) 40 MG tablet Take 40 mg by mouth daily.  polyvinyl alcohol (LIQUIFILM TEARS) 1.4 % ophthalmic solution Place 1 drop into both eyes as needed for dry eyes.     rosuvastatin (CRESTOR) 10 MG tablet Take 1 tablet (10 mg total) by mouth every other day. 45 tablet 3   Semaglutide, 2 MG/DOSE, (OZEMPIC, 2 MG/DOSE,) 8 MG/3ML SOPN Inject 2 mg into the skin once a week.     sulfamethoxazole-trimethoprim (BACTRIM) 400-80 MG tablet TAKE 1 TABLET BY MOUTH TWICE DAILY 60 tablet 1   No current facility-administered medications for this visit.     Family History  Problem Relation Age of Onset   Hypertension Mother    Stroke Mother    Heart disease Father    Stroke Brother    Multiple sclerosis Brother    Multiple sclerosis Sister    Colon cancer Neg Hx    Colon polyps Neg Hx    Esophageal cancer Neg Hx    Rectal cancer Neg Hx    Stomach cancer Neg Hx        Physical Exam: Lungs: clear Card: RR with harsh systolic murmur Ext: Warm Neuro intact Teeth in good repair     Diagnostic Studies & Laboratory data: I have personally reviewed the following studies and agree with the findings   TTE (04/2023) IMPRESSIONS     1. Left ventricular ejection fraction, by estimation, is 60 to 65%. The  left ventricle has normal function. The left ventricle has no regional  wall motion abnormalities. There is mild concentric left ventricular  hypertrophy. Left ventricular diastolic  parameters are consistent with Grade II diastolic dysfunction  (pseudonormalization).   2. Right ventricular systolic function is normal. The right ventricular  size is normal.   3. The mitral valve is normal in structure. Mild mitral valve  regurgitation. No evidence of mitral stenosis.   4. The aortic valve is severely calcified but  otherwise not well  visualized. Aortic valve regurgitation is mild to moderate. Severe aortic  valve stenosis with mean gradient of (manually measured) and AVA by  VTI less than 1cm2.   5. The inferior vena cava is normal in size with greater than 50%  respiratory variability, suggesting right atrial pressure of 3 mmHg.   FINDINGS   Left Ventricle: Left ventricular ejection fraction, by estimation, is 60  to 65%. The left ventricle has normal function. The left ventricle has no  regional wall motion abnormalities. The left ventricular internal cavity  size was normal in size. There is   mild concentric left ventricular hypertrophy. Left ventricular diastolic  parameters are consistent with Grade II diastolic dysfunction  (pseudonormalization).   Right Ventricle: The right ventricular size is normal. No increase in  right ventricular wall thickness. Right ventricular systolic function is  normal.   Left Atrium: Left atrial size was normal in size.   Right Atrium: Right atrial size was normal in size.   Pericardium: There is no evidence of pericardial effusion.   Mitral Valve: The mitral valve is normal in structure. Mild mitral valve  regurgitation. No evidence of mitral valve stenosis. MV peak gradient, 6.8  mmHg. The mean mitral valve gradient is 3.5 mmHg.   Tricuspid Valve: The tricuspid valve is normal in structure. Tricuspid  valve regurgitation is trivial. No evidence of tricuspid stenosis.   Aortic Valve: The aortic valve is calcified. Aortic valve regurgitation is  mild to moderate. Aortic regurgitation PHT measures 432 msec. Severe  aortic stenosis is present. Aortic valve mean gradient measures 30.6 mmHg.  Aortic valve  peak gradient measures   69.0 mmHg. Aortic valve area, by VTI measures 0.93 cm.   Pulmonic Valve: The pulmonic valve was normal in structure. Pulmonic valve  regurgitation is not visualized. No evidence of pulmonic stenosis.   Aorta: The  aortic root is normal in size and structure.   Venous: The inferior vena cava is normal in size with greater than 50%  respiratory variability, suggesting right atrial pressure of 3 mmHg.   IAS/Shunts: No atrial level shunt detected by color flow Doppler.     LEFT VENTRICLE  PLAX 2D  LVIDd:         3.40 cm      Diastology  LVIDs:         1.80 cm      LV e' medial:    6.20 cm/s  LV PW:         0.90 cm      LV E/e' medial:  20.6  LV IVS:        1.00 cm      LV e' lateral:   5.11 cm/s  LVOT diam:     1.80 cm      LV E/e' lateral: 25.0  LV SV:         69  LV SV Index:   38  LVOT Area:     2.54 cm    LV Volumes (MOD)  LV vol d, MOD A2C: 92.7 ml  LV vol d, MOD A4C: 144.0 ml  LV vol s, MOD A2C: 29.2 ml  LV vol s, MOD A4C: 39.4 ml  LV SV MOD A2C:     63.5 ml  LV SV MOD A4C:     144.0 ml  LV SV MOD BP:      94.9 ml   RIGHT VENTRICLE             IVC  RV Basal diam:  3.10 cm     IVC diam: 1.20 cm  RV Mid diam:    2.70 cm  RV S prime:     14.60 cm/s  TAPSE (M-mode): 3.3 cm   LEFT ATRIUM             Index        RIGHT ATRIUM          Index  LA diam:        3.10 cm 1.71 cm/m   RA Area:     9.52 cm  LA Vol (A2C):   34.0 ml 18.76 ml/m  RA Volume:   18.50 ml 10.21 ml/m  LA Vol (A4C):   39.5 ml 21.79 ml/m  LA Biplane Vol: 39.9 ml 22.02 ml/m   AORTIC VALVE                     PULMONIC VALVE  AV Area (Vmax):    0.77 cm      PV Vmax:       1.25 m/s  AV Area (Vmean):   0.85 cm      PV Peak grad:  6.3 mmHg  AV Area (VTI):     0.93 cm  AV Vmax:           415.40 cm/s  AV Vmean:          236.600 cm/s  AV VTI:            0.747 m  AV Peak Grad:      69.0 mmHg  AV Mean Grad:  30.6 mmHg  LVOT Vmax:         126.00 cm/s  LVOT Vmean:        78.800 cm/s  LVOT VTI:          0.273 m  LVOT/AV VTI ratio: 0.37  AI PHT:            432 msec    AORTA  Ao Root diam: 2.40 cm  Ao Asc diam:  2.90 cm   MITRAL VALVE  MV Area (PHT): 3.60 cm     SHUNTS  MV Area VTI:   2.12 cm     Systemic  VTI:  0.27 m  MV Peak grad:  6.8 mmHg     Systemic Diam: 1.80 cm  MV Mean grad:  3.5 mmHg  MV Vmax:       1.30 m/s  MV Vmean:      83.6 cm/s  MV Decel Time: 211 msec  MV E velocity: 128.00 cm/s  MV A velocity: 129.00 cm/s  MV E/A ratio:  0.99   CATH (03/2023) Conclusion  1.  Severe aortic stenosis with mean transvalvular gradient of 40 mmHg 2.  Widely patent coronary arteries with minimal irregularities and no significant stenosis   Recent Radiology Findings:   CTA (03/2023) FINDINGS: Image quality: Excellent.   Noise artifact is: Limited.   Valve Morphology: Tricuspid aortic valve with severely calcified and thickened leaflets. Restricted leaflet motion in systole.   Aortic Valve Calcium score: 1014   Aortic annular dimension:   Phase assessed: 25%   Annular area: 287 mm2   Annular perimeter: 62.1 mm   Max diameter: 22.1 mm   Min diameter: 16.8 mm   Annular and subannular calcification: None.   Membranous septum length: 7.5 mm   Optimal coplanar projection: LAO 1 CRA 1   Coronary Artery Height above Annulus:   Left Main: 8.3 mm   Right Coronary: 13.1 mm   Sinus of Valsalva Measurements:   Non-coronary: 22.7 mm   Right-coronary: 25.2 mm   Left-coronary: 26.8 mm   Sinus of Valsalva Height:   Non-coronary: 13.5 mm   Right-coronary: 17.1 mm   Left-coronary: 14.4 mm   Sinotubular Junction: 22.7 mm   Ascending Thoracic Aorta: 28.6 mm   Coronary Arteries: Normal coronary origin. Right dominance. The study was performed without use of NTG and is insufficient for plaque evaluation. Please refer to recent cardiac catheterization for coronary assessment. 3-vessel coronary calcifications.   Cardiac Morphology:   Right Atrium: Right atrial size is within normal limits.   Right Ventricle: The right ventricular cavity is within normal limits.   Left Atrium: Left atrial size is normal in size with no left atrial appendage filling defect.   Left  Ventricle: The ventricular cavity size is within normal limits.   Pulmonary arteries: Normal in size without proximal filling defect.   Pulmonary veins: Normal pulmonary venous drainage.   Pericardium: Normal thickness with no significant effusion or calcium present.   Mitral Valve: The mitral valve is normal structure without significant calcification.   Extra-cardiac findings: See attached radiology report for non-cardiac structures.   IMPRESSION: 1. Small aortic annulus (287 mm2).   2. Annular measurements support a 20 mm S3, but shallow left main coronary height. Sinus heights are not favorable for a 23 mm Evolut Pro. Structural heart team discussion recommended.   3. No significant annular or subannular calcifications.   4. Low left main coronary artery height, 8.3 mm.   5. Optimal Fluoroscopic Angle  for Delivery: LAO 1 CRA 1      Recent Lab Findings: Lab Results  Component Value Date   WBC 8.9 04/30/2023   HGB 9.3 (L) 04/30/2023   HCT 30.4 (L) 04/30/2023   PLT 326 04/30/2023   GLUCOSE 125 (H) 04/30/2023   CHOL 153 02/03/2023   TRIG 135 02/03/2023   HDL 43 02/03/2023   LDLDIRECT 106 (H) 04/27/2023   LDLCALC 86 02/03/2023   ALT 18 04/28/2023   AST 17 04/28/2023   NA 138 04/30/2023   K 4.0 04/30/2023   CL 99 04/30/2023   CREATININE 1.83 (H) 04/30/2023   BUN 39 (H) 04/30/2023   CO2 25 04/30/2023   TSH 4.110 04/28/2023   INR 1.1 02/03/2022   HGBA1C 6.2 (H) 04/28/2023      Assessment / Plan:     Pt is a 67 yo female with NYHA class 3 symptoms of severe AS with normal LV function and no CAD> She unfortunately has a very small annulus which makes TAVR higher risk for coronary obstruction. Her body habitus and immobility would make surgery higher risk which will need root replacement or annulus enlarging but overall I feel would be the best path. She understands and wishes to proceed after the new year. All the risks and goals and recovery were discussed.  Surgery will be 1/3   I have spent 60 min in review of the records, viewing studies and in face to face with patient and in coordination of future care    Eugenio Hoes 05/03/2023 6:23 PM

## 2023-05-04 ENCOUNTER — Encounter: Payer: Self-pay | Admitting: Thoracic Surgery (Cardiothoracic Vascular Surgery)

## 2023-05-04 ENCOUNTER — Other Ambulatory Visit: Payer: Self-pay

## 2023-05-04 ENCOUNTER — Encounter: Payer: Self-pay | Admitting: *Deleted

## 2023-05-04 ENCOUNTER — Other Ambulatory Visit: Payer: Self-pay | Admitting: *Deleted

## 2023-05-04 ENCOUNTER — Institutional Professional Consult (permissible substitution): Payer: Medicare HMO | Admitting: Thoracic Surgery (Cardiothoracic Vascular Surgery)

## 2023-05-04 VITALS — BP 127/77 | HR 89 | Resp 18 | Ht <= 58 in | Wt 192.0 lb

## 2023-05-04 DIAGNOSIS — I350 Nonrheumatic aortic (valve) stenosis: Secondary | ICD-10-CM

## 2023-05-04 NOTE — Patient Instructions (Signed)
AVR with possible root replacement 1/3

## 2023-05-05 ENCOUNTER — Encounter: Payer: Medicare HMO | Admitting: Thoracic Surgery (Cardiothoracic Vascular Surgery)

## 2023-05-06 ENCOUNTER — Other Ambulatory Visit (HOSPITAL_COMMUNITY): Payer: Medicare HMO

## 2023-05-10 ENCOUNTER — Encounter (HOSPITAL_COMMUNITY): Payer: Medicare HMO

## 2023-05-10 ENCOUNTER — Inpatient Hospital Stay (HOSPITAL_COMMUNITY): Admit: 2023-05-10 | Payer: Medicare HMO | Admitting: Cardiovascular Disease

## 2023-05-10 DIAGNOSIS — I350 Nonrheumatic aortic (valve) stenosis: Secondary | ICD-10-CM

## 2023-05-10 SURGERY — TRANSCATHETER AORTIC VALVE REPLACEMENT, TRANSFEMORAL (CATHLAB)
Anesthesia: Monitor Anesthesia Care

## 2023-05-23 NOTE — Pre-Procedure Instructions (Signed)
Surgical Instructions   Your procedure is scheduled on Friday, January 3rd. Report to [name] "A" at 05:30 A.M., then check in with the Admitting office. Any questions or running late day of surgery: call [phone]  Questions prior to your surgery date: call [phone], Monday-Friday, 8am-4pm. If you experience any cold or flu symptoms such as cough, fever, chills, shortness of breath, etc. between now and your scheduled surgery, please notify us at the above number.     Remember:  Do not eat or drink after midnight the night before your surgery    Take these medicines the morning of surgery with A SIP OF WATER carvedilol (COREG)  cetirizine (ZYRTEC)  gabapentin (NEURONTIN)  pantoprazole (PROTONIX)  rosuvastatin (CRESTOR)  sulfamethoxazole-trimethoprim (BACTRIM)   May take these medicines IF NEEDED: acetaminophen (TYLENOL)  albuterol (PROAIR HFA)- bring inhaler with you on day of surgery fluticasone (FLONASE)  polyvinyl alcohol (LIQUIFILM TEARS) eye drops   One week prior to surgery, STOP taking any Aspirin (unless otherwise instructed by your surgeon) Aleve, Naproxen, Ibuprofen, Motrin, Advil, Goody's, BC's, all herbal medications, fish oil, and non-prescription vitamins. This includes celecoxib (CELEBREX).  WHAT DO I DO ABOUT MY DIABETES MEDICATION?   Do not take metFORMIN (GLUCOPHAGE) the morning of surgery.  Stop taking Semaglutide (OZEMPIC) 7 days prior to surgery. Last dose on or before 12/26.     HOW TO MANAGE YOUR DIABETES BEFORE AND AFTER SURGERY  Why is it important to control my blood sugar before and after surgery? Improving blood sugar levels before and after surgery helps healing and can limit problems. A way of improving blood sugar control is eating a healthy diet by:  Eating less sugar and carbohydrates  Increasing activity/exercise  Talking with your doctor about reaching your blood sugar goals High blood sugars (greater than 180  mg/dL) can raise your risk of infections and slow your recovery, so you will need to focus on controlling your diabetes during the weeks before surgery. Make sure that the doctor who takes care of your diabetes knows about your planned surgery including the date and location.  How do I manage my blood sugar before surgery? Check your blood sugar at least 4 times a day, starting 2 days before surgery, to make sure that the level is not too high or low.  Check your blood sugar the morning of your surgery when you wake up and every 2 hours until you get to the Short Stay unit.  If your blood sugar is less than 70 mg/dL, you will need to treat for low blood sugar: Do not take insulin. Treat a low blood sugar (less than 70 mg/dL) with  cup of clear juice (cranberry or apple), 4 glucose tablets, OR glucose gel. Recheck blood sugar in 15 minutes after treatment (to make sure it is greater than 70 mg/dL). If your blood sugar is not greater than 70 mg/dL on recheck, call [phone] for further instructions. Report your blood sugar to the short stay nurse when you get to Short Stay.  If you are admitted to the hospital after surgery: Your blood sugar will be checked by the staff and you will probably be given insulin after surgery (instead of oral diabetes medicines) to make sure you have good blood sugar levels. The goal for blood sugar control after surgery is 80-180 mg/dL.                     Do NOT Smoke (Tobacco/Vaping) for 24 hours  prior to your procedure.  If you use a CPAP at night, you may bring your mask/headgear for your overnight stay.   You will be asked to remove any contacts, glasses, piercing's, hearing aid's, dentures/partials prior to surgery. Please bring cases for these items if needed.    Patients discharged the day of surgery will not be allowed to drive home, and someone needs to stay with them for 24 hours.  SURGICAL WAITING ROOM VISITATION Patients may have no more than 2  support people in the waiting area - these visitors may rotate.   Pre-op nurse will coordinate an appropriate time for 1 ADULT support person, who may not rotate, to accompany patient in pre-op.  Children under the age of 17 must have an adult with them who is not the patient and must remain in the main waiting area with an adult.  If the patient needs to stay at the hospital during part of their recovery, the visitor guidelines for inpatient rooms apply.  Please refer to the [facility] website for the visitor guidelines for any additional information.   If you received a COVID test during your pre-op visit  it is requested that you wear a mask when out in public, stay away from anyone that may not be feeling well and notify your surgeon if you develop symptoms. If you have been in contact with anyone that has tested positive in the last 10 days please notify you surgeon.      Pre-operative CHG Bathing Instructions   You can play a key role in reducing the risk of infection after surgery. Your skin needs to be as free of germs as possible. You can reduce the number of germs on your skin by washing with CHG (chlorhexidine gluconate) soap before surgery. CHG is an antiseptic soap that kills germs and continues to kill germs even after washing.   DO NOT use if you have an allergy to chlorhexidine/CHG or antibacterial soaps. If your skin becomes reddened or irritated, stop using the CHG and notify one of our RNs at [phone].              TAKE A SHOWER THE NIGHT BEFORE SURGERY AND THE DAY OF SURGERY    Please keep in mind the following:  DO NOT shave, including legs and underarms, 48 hours prior to surgery.   You may shave your face before/day of surgery.  Place clean sheets on your bed the night before surgery Use a clean washcloth (not used since being washed) for each shower. DO NOT sleep with pet's night before surgery.  CHG Shower Instructions:  Wash your face and private area with  normal soap. If you choose to wash your hair, wash first with your normal shampoo.  After you use shampoo/soap, rinse your hair and body thoroughly to remove shampoo/soap residue.  Turn the water OFF and apply half the bottle of CHG soap to a CLEAN washcloth.  Apply CHG soap ONLY FROM YOUR NECK DOWN TO YOUR TOES (washing for 3-5 minutes)  DO NOT use CHG soap on face, private areas, open wounds, or sores.  Pay special attention to the area where your surgery is being performed.  If you are having back surgery, having someone wash your back for you may be helpful. Wait 2 minutes after CHG soap is applied, then you may rinse off the CHG soap.  Pat dry with a clean towel  Put on clean pajamas    Additional instructions for the day  of surgery: DO NOT APPLY any lotions, deodorants, cologne, or perfumes.   Do not wear jewelry or makeup Do not wear nail polish, gel polish, artificial nails, or any other type of covering on natural nails (fingers and toes) Do not bring valuables to the hospital. [facility] is not responsible for valuables/personal belongings. Put on clean/comfortable clothes.  Please brush your teeth.  Ask your nurse before applying any prescription medications to the skin.

## 2023-05-24 ENCOUNTER — Encounter (HOSPITAL_COMMUNITY): Payer: Self-pay

## 2023-05-24 ENCOUNTER — Encounter (HOSPITAL_COMMUNITY)
Admission: RE | Admit: 2023-05-24 | Discharge: 2023-05-24 | Disposition: A | Discharge status: Home / Self Care | Payer: Medicare HMO | Source: Ambulatory Visit | Attending: Thoracic Surgery (Cardiothoracic Vascular Surgery) | Admitting: Thoracic Surgery (Cardiothoracic Vascular Surgery)

## 2023-05-24 ENCOUNTER — Ambulatory Visit (HOSPITAL_COMMUNITY)
Admission: RE | Admit: 2023-05-24 | Discharge: 2023-05-24 | Disposition: A | Discharge status: Home / Self Care | Payer: Medicare HMO | Source: Ambulatory Visit | Attending: Thoracic Surgery (Cardiothoracic Vascular Surgery) | Admitting: Thoracic Surgery (Cardiothoracic Vascular Surgery)

## 2023-05-24 ENCOUNTER — Other Ambulatory Visit: Payer: Self-pay

## 2023-05-24 VITALS — BP 128/58 | HR 90 | Temp 98.4°F | Resp 17 | Ht <= 58 in | Wt 200.0 lb

## 2023-05-24 DIAGNOSIS — I350 Nonrheumatic aortic (valve) stenosis: Secondary | ICD-10-CM | POA: Insufficient documentation

## 2023-05-24 DIAGNOSIS — Z9889 Other specified postprocedural states: Secondary | ICD-10-CM | POA: Diagnosis not present

## 2023-05-24 DIAGNOSIS — I10 Essential (primary) hypertension: Secondary | ICD-10-CM | POA: Insufficient documentation

## 2023-05-24 DIAGNOSIS — Z01812 Encounter for preprocedural laboratory examination: Secondary | ICD-10-CM | POA: Diagnosis present

## 2023-05-24 DIAGNOSIS — Z981 Arthrodesis status: Secondary | ICD-10-CM | POA: Diagnosis not present

## 2023-05-24 DIAGNOSIS — E119 Type 2 diabetes mellitus without complications: Secondary | ICD-10-CM | POA: Insufficient documentation

## 2023-05-24 DIAGNOSIS — Z01810 Encounter for preprocedural cardiovascular examination: Secondary | ICD-10-CM | POA: Diagnosis present

## 2023-05-24 DIAGNOSIS — Z01818 Encounter for other preprocedural examination: Secondary | ICD-10-CM | POA: Insufficient documentation

## 2023-05-24 DIAGNOSIS — I2510 Atherosclerotic heart disease of native coronary artery without angina pectoris: Secondary | ICD-10-CM | POA: Insufficient documentation

## 2023-05-24 DIAGNOSIS — E785 Hyperlipidemia, unspecified: Secondary | ICD-10-CM | POA: Diagnosis not present

## 2023-05-24 LAB — URINALYSIS, ROUTINE W REFLEX MICROSCOPIC
Bilirubin Urine: NEGATIVE
Glucose, UA: NEGATIVE mg/dL
Hgb urine dipstick: NEGATIVE
Ketones, ur: NEGATIVE mg/dL
Nitrite: NEGATIVE
Protein, ur: NEGATIVE mg/dL
Specific Gravity, Urine: 1.015 (ref 1.005–1.030)
pH: 7 (ref 5.0–8.0)

## 2023-05-24 LAB — GLUCOSE, CAPILLARY: Glucose-Capillary: 98 mg/dL (ref 70–99)

## 2023-05-24 LAB — CBC
HCT: 34.1 % — ABNORMAL LOW (ref 36.0–46.0)
Hemoglobin: 10.2 g/dL — ABNORMAL LOW (ref 12.0–15.0)
MCH: 24.1 pg — ABNORMAL LOW (ref 26.0–34.0)
MCHC: 29.9 g/dL — ABNORMAL LOW (ref 30.0–36.0)
MCV: 80.6 fL (ref 80.0–100.0)
Platelets: 299 10*3/uL (ref 150–400)
RBC: 4.23 MIL/uL (ref 3.87–5.11)
RDW: 18.3 % — ABNORMAL HIGH (ref 11.5–15.5)
WBC: 4.7 10*3/uL (ref 4.0–10.5)
nRBC: 0 % (ref 0.0–0.2)

## 2023-05-24 LAB — COMPREHENSIVE METABOLIC PANEL
ALT: 19 U/L (ref 0–44)
AST: 19 U/L (ref 15–41)
Albumin: 4.1 g/dL (ref 3.5–5.0)
Alkaline Phosphatase: 71 U/L (ref 38–126)
Anion gap: 9 (ref 5–15)
BUN: 23 mg/dL (ref 8–23)
CO2: 27 mmol/L (ref 22–32)
Calcium: 9.8 mg/dL (ref 8.9–10.3)
Chloride: 103 mmol/L (ref 98–111)
Creatinine, Ser: 1.41 mg/dL — ABNORMAL HIGH (ref 0.44–1.00)
GFR, Estimated: 41 mL/min — ABNORMAL LOW (ref >60)
Glucose, Bld: 96 mg/dL (ref 70–99)
Potassium: 4.1 mmol/L (ref 3.5–5.1)
Sodium: 139 mmol/L (ref 135–145)
Total Bilirubin: 0.4 mg/dL (ref 0.0–1.2)
Total Protein: 7.9 g/dL (ref 6.5–8.1)

## 2023-05-24 LAB — PROTIME-INR
INR: 1 (ref 0.8–1.2)
Prothrombin Time: 13.2 s (ref 11.4–15.2)

## 2023-05-24 LAB — TYPE AND SCREEN
ABO/RH(D): O POS
Antibody Screen: NEGATIVE

## 2023-05-24 LAB — HEMOGLOBIN A1C
Hgb A1c MFr Bld: 5.9 % — ABNORMAL HIGH (ref 4.8–5.6)
Mean Plasma Glucose: 123 mg/dL

## 2023-05-24 LAB — SURGICAL PCR SCREEN
MRSA, PCR: NEGATIVE
Staphylococcus aureus: NEGATIVE

## 2023-05-24 LAB — APTT: aPTT: 39 s — ABNORMAL HIGH (ref 24–36)

## 2023-05-24 NOTE — Progress Notes (Signed)
Darius Bump, RN, notified about abnormal UA results

## 2023-05-24 NOTE — Progress Notes (Signed)
 PCP - Dr. Ozell Reilly Cardiologist - Dr. Maude Emmer  PPM/ICD - denies   Chest x-ray - 05/24/23 EKG - 05/24/23 Stress Test - 08/04/22 ECHO - 04/28/23 Cardiac Cath - 04/06/23  Sleep Study - denies   Fasting Blood Sugar - 100-110 Checks Blood Sugar twice per week  Last dose of GLP1 agonist-  05/21/23 Glenetta Sprang, RN, aware that pt took last dose on 12/28) GLP1 instructions: Pt aware not to take any further doses prior to surgery  ASA/Blood Thinner Instructions: n/a   ERAS Protcol - no, NPO   COVID TEST- 05/24/23   Anesthesia review: yes, cardiac hx (hx of spinal headache with C-sections and spinal fusion in 2019)  Patient denies shortness of breath, fever, cough and chest pain at PAT appointment   All instructions explained to the patient, with a verbal understanding of the material. Patient agrees to go over the instructions while at home for a better understanding. Patient also instructed to wear a mask in public after being tested for COVID-19. The opportunity to ask questions was provided.

## 2023-05-25 LAB — SARS CORONAVIRUS 2 (TAT 6-24 HRS): SARS Coronavirus 2: NEGATIVE

## 2023-05-26 ENCOUNTER — Other Ambulatory Visit: Payer: Self-pay | Admitting: Internal Medicine

## 2023-05-26 MED ORDER — MANNITOL 20 % IV SOLN
INTRAVENOUS | Status: AC
  Filled 2023-05-26: qty 13

## 2023-05-26 MED ORDER — VANCOMYCIN HCL 1000 MG IV SOLR
INTRAVENOUS | Status: AC
  Filled 2023-05-26: qty 20

## 2023-05-26 MED ORDER — VANCOMYCIN HCL 1500 MG/300ML IV SOLN
1500.0000 mg | INTRAVENOUS | Status: AC
  Filled 2023-05-26 (×3): qty 300

## 2023-05-26 MED ORDER — TRANEXAMIC ACID (OHS) BOLUS VIA INFUSION
15.0000 mg/kg | INTRAVENOUS | Status: AC
  Filled 2023-05-26: qty 1361

## 2023-05-26 MED ORDER — EPINEPHRINE HCL 5 MG/250ML IV SOLN IN NS
0.0000 ug/min | INTRAVENOUS | Status: AC
  Filled 2023-05-26: qty 250

## 2023-05-26 MED ORDER — NITROGLYCERIN IN D5W 200-5 MCG/ML-% IV SOLN
2.0000 ug/min | INTRAVENOUS | Status: AC
  Filled 2023-05-26: qty 250

## 2023-05-26 MED ORDER — MILRINONE LACTATE IN DEXTROSE 20-5 MG/100ML-% IV SOLN
0.3000 ug/kg/min | INTRAVENOUS | Status: AC
  Filled 2023-05-26: qty 100

## 2023-05-26 MED ORDER — HEPARIN 30,000 UNITS/1000 ML (OHS) CELLSAVER SOLUTION
Status: AC
  Filled 2023-05-26: qty 1000

## 2023-05-26 MED ORDER — INSULIN REGULAR(HUMAN) IN NACL 100-0.9 UT/100ML-% IV SOLN
INTRAVENOUS | Status: AC
  Filled 2023-05-26: qty 100

## 2023-05-26 MED ORDER — TRANEXAMIC ACID (OHS) PUMP PRIME SOLUTION
2.0000 mg/kg | INTRAVENOUS | Status: AC
  Filled 2023-05-26: qty 1.81

## 2023-05-26 MED ORDER — VANCOMYCIN HCL 1.5 G IV SOLR
1500.0000 mg | INTRAVENOUS | Status: DC
  Filled 2023-05-26: qty 30

## 2023-05-26 MED ORDER — CEFAZOLIN SODIUM-DEXTROSE 2-4 GM/100ML-% IV SOLN
2.0000 g | INTRAVENOUS | Status: AC
  Filled 2023-05-26: qty 100

## 2023-05-26 MED ORDER — PLASMA-LYTE A IV SOLN
INTRAVENOUS | Status: AC
  Filled 2023-05-26: qty 2.5

## 2023-05-26 MED ORDER — POTASSIUM CHLORIDE 2 MEQ/ML IV SOLN
80.0000 meq | INTRAVENOUS | Status: AC
  Filled 2023-05-26: qty 40

## 2023-05-26 MED ORDER — DEXMEDETOMIDINE HCL IN NACL 400 MCG/100ML IV SOLN
0.1000 ug/kg/h | INTRAVENOUS | Status: AC
  Filled 2023-05-26: qty 100

## 2023-05-26 MED ORDER — TRANEXAMIC ACID 1000 MG/10ML IV SOLN
1.5000 mg/kg/h | INTRAVENOUS | Status: AC
  Filled 2023-05-26: qty 25

## 2023-05-26 MED ORDER — PHENYLEPHRINE HCL-NACL 20-0.9 MG/250ML-% IV SOLN
30.0000 ug/min | INTRAVENOUS | Status: AC
  Filled 2023-05-26: qty 250

## 2023-05-26 MED ORDER — NOREPINEPHRINE 4 MG/250ML-% IV SOLN
0.0000 ug/min | INTRAVENOUS | Status: AC
  Filled 2023-05-26: qty 250

## 2023-05-26 NOTE — H&P (Signed)
 [address]       [place] [address]             [phone]                                   [name] [facility] Medical Record #[id number] Date of Birth: 1955-10-31   Wendall Stade, MD [name], MD   Chief Complaint:   DOE and AS   History of Present Illness:     Pt is a 68 yo female who has been suffering from DOE and has been found to have severe AS with a mean gradient of and EF of 70%. Pt recently admitted with CHF at [facility] and was discharged. She has had a work up with echo and cath (no CAD) and on CTA work up was found to have very small annulus which is prohibitive for a sapien valve and for a medtronic would risk coronary obstruction. She is here to discuss options. She has had back surgeries which needed hardware removal and now walks with a cane at home             Past Medical History:  Diagnosis Date   Allergy     Anemia     Anxiety     Arthritis     Asthma     BMI 40.0-44.9, adult (HCC) 04/07/2014   DM type 2 (diabetes mellitus, type 2) (HCC)     GERD (gastroesophageal reflux disease)     HTN (hypertension)     Hypercholesterolemia     Osteoarthritis of left hip 04/07/2014   Reflux     Severe aortic stenosis     Spinal headache      with C-Section and with spinal fusion in 2019               Past Surgical History:  Procedure Laterality Date   CESAREAN SECTION        x3   COLONOSCOPY       LAMINECTOMY   03/2021   LEFT HEART CATH AND CORONARY ANGIOGRAPHY N/A 04/06/2023    Procedure: LEFT HEART CATH AND CORONARY ANGIOGRAPHY;  Surgeon: Tonny Bollman, MD;  Location: [facility] INVASIVE CV LAB;  Service: Cardiovascular;  Laterality: N/A;   SPINAL FUSION   2019   TUBAL LIGATION              Tobacco Use History  Social History       Tobacco Use  Smoking Status Never  Smokeless Tobacco Never      Social History        Substance and Sexual Activity  Alcohol Use No   Alcohol/week: 0.0  standard drinks of alcohol      Social History         Socioeconomic History   Marital status: Married      Spouse name: Not on file   Number of children: Not on file   Years of education: Not on file   Highest education level: Not on file  Occupational History   Not on file  Tobacco Use   Smoking status: Never   Smokeless tobacco: Never  Vaping Use   Vaping status: Never Used  Substance and Sexual Activity   Alcohol use: No      Alcohol/week: 0.0 standard drinks of alcohol   Drug use: No  Sexual activity: Yes  Other Topics Concern   Not on file  Social History Narrative   Not on file    Social Determinants of Health        Financial Resource Strain: Low Risk  (04/13/2023)    Received from [facility]    Overall Financial Resource Strain (CARDIA)     Difficulty of Paying Living Expenses: Not hard at all  Food Insecurity: No Food Insecurity (04/28/2023)    Hunger Vital Sign     Worried About Running Out of Food in the Last Year: Never true     Ran Out of Food in the Last Year: Never true  Transportation Needs: No Transportation Needs (04/28/2023)    PRAPARE - Therapist, art (Medical): No     Lack of Transportation (Non-Medical): No  Physical Activity: Inactive (04/13/2023)    Received from [facility]    Exercise Vital Sign     Days of Exercise per Week: 0 days     Minutes of Exercise per Session: 30 min  Stress: No Stress Concern Present (04/13/2023)    Received from [facility] - Occupational Stress Questionnaire     Feeling of Stress : Only a little  Social Connections: Somewhat Isolated (04/13/2023)    Received from [facility]    Social Network     How would you rate your social network (family, work, friends)?: Restricted participation with some degree of social isolation  Intimate Partner Violence: Not At Risk (04/28/2023)    Humiliation, Afraid, Rape, and Kick questionnaire      Fear of Current or Ex-Partner: No     Emotionally Abused: No     Physically Abused: No     Sexually Abused: No      Allergies       Allergies  Allergen Reactions   Crestor [Rosuvastatin] Other (See Comments)      Myalgia    Robaxin [Methocarbamol] Other (See Comments)      Insomnia   Toradol [Ketorolac Tromethamine] Other (See Comments)   Zocor [Simvastatin] Other (See Comments)      Myalgias    Lipitor [Atorvastatin] Other (See Comments)      Myalgia   Sulfa Antibiotics Hives              Current Outpatient Medications  Medication Sig Dispense Refill   acetaminophen (TYLENOL) 500 MG tablet Take 1,000 mg by mouth every 6 (six) hours as needed for mild pain (pain score 1-3) or headache.       albuterol (PROAIR HFA) 108 (90 BASE) MCG/ACT inhaler Inhale 2 puffs into the lungs every 6 (six) hours as needed. 1 Inhaler 12   amLODipine (NORVASC) 10 MG tablet Take 1 tablet (10 mg total) by mouth daily. (Patient taking differently: Take 10 mg by mouth at bedtime.) 90 tablet 1   ascorbic acid (VITAMIN C) 500 MG tablet Take 500 mg by mouth daily.       carvedilol (COREG) 25 MG tablet Take 1 tablet (25 mg total) by mouth 2 (two) times daily. (Patient not taking: Reported on 04/28/2023) 60 tablet 0   celecoxib (CELEBREX) 200 MG capsule Take 200 mg by mouth 2 (two) times daily.    1   cetirizine (ZYRTEC) 10 MG tablet Take 1 tablet (10 mg total) by mouth daily. 30 tablet 0   docusate sodium (COLACE) 100 MG capsule Take 200-300 mg by mouth at bedtime.  fluticasone (FLONASE) 50 MCG/ACT nasal spray Place 2 sprays into both nostrils daily. (Patient taking differently: Place 2 sprays into both nostrils daily as needed for rhinitis.) 16 g 0   furosemide (LASIX) 20 MG tablet Take 2 tablets (40 mg total) by mouth daily. 30 tablet 0   gabapentin (NEURONTIN) 600 MG tablet Take 600 mg by mouth 3 (three) times daily.       iron polysaccharides (NIFEREX) 150 MG capsule TAKE 1 CAPSULE (150 MG DOSE) BY  MOUTH TWICE A DAY (Patient taking differently: Take 150 mg by mouth daily.) 180 capsule 1   losartan-hydrochlorothiazide (HYZAAR) 100-25 MG tablet Take 1 tablet by mouth daily.       metFORMIN (GLUCOPHAGE) 1000 MG tablet Take 1 tablet (1,000 mg total) by mouth 2 (two) times daily with a meal. (Patient taking differently: Take 1,000 mg by mouth daily.) 180 tablet 1   pantoprazole (PROTONIX) 40 MG tablet Take 40 mg by mouth daily.       polyvinyl alcohol (LIQUIFILM TEARS) 1.4 % ophthalmic solution Place 1 drop into both eyes as needed for dry eyes.       rosuvastatin (CRESTOR) 10 MG tablet Take 1 tablet (10 mg total) by mouth every other day. 45 tablet 3   Semaglutide, 2 MG/DOSE, (OZEMPIC, 2 MG/DOSE,) 8 MG/3ML SOPN Inject 2 mg into the skin once a week.       sulfamethoxazole-trimethoprim (BACTRIM) 400-80 MG tablet TAKE 1 TABLET BY MOUTH TWICE DAILY 60 tablet 1      No current facility-administered medications for this visit.               Family History  Problem Relation Age of Onset   Hypertension Mother     Stroke Mother     Heart disease Father     Stroke Brother     Multiple sclerosis Brother     Multiple sclerosis Sister     Colon cancer Neg Hx     Colon polyps Neg Hx     Esophageal cancer Neg Hx     Rectal cancer Neg Hx     Stomach cancer Neg Hx                  Physical Exam: Lungs: clear Card: RR with harsh systolic murmur Ext: Warm Neuro intact Teeth in good repair         Diagnostic Studies & Laboratory data: I have personally reviewed the following studies and agree with the findings   TTE (04/2023) IMPRESSIONS     1. Left ventricular ejection fraction, by estimation, is 60 to 65%. The  left ventricle has normal function. The left ventricle has no regional  wall motion abnormalities. There is mild concentric left ventricular  hypertrophy. Left ventricular diastolic  parameters are consistent with Grade II diastolic dysfunction  (pseudonormalization).    2. Right ventricular systolic function is normal. The right ventricular  size is normal.   3. The mitral valve is normal in structure. Mild mitral valve  regurgitation. No evidence of mitral stenosis.   4. The aortic valve is severely calcified but otherwise not well  visualized. Aortic valve regurgitation is mild to moderate. Severe aortic  valve stenosis with mean gradient of (manually measured) and AVA by  VTI less than 1cm2.   5. The inferior vena cava is normal in size with greater than 50%  respiratory variability, suggesting right atrial pressure of 3 mmHg.   FINDINGS   Left Ventricle: Left ventricular ejection  fraction, by estimation, is 60  to 65%. The left ventricle has normal function. The left ventricle has no  regional wall motion abnormalities. The left ventricular internal cavity  size was normal in size. There is   mild concentric left ventricular hypertrophy. Left ventricular diastolic  parameters are consistent with Grade II diastolic dysfunction  (pseudonormalization).   Right Ventricle: The right ventricular size is normal. No increase in  right ventricular wall thickness. Right ventricular systolic function is  normal.   Left Atrium: Left atrial size was normal in size.   Right Atrium: Right atrial size was normal in size.   Pericardium: There is no evidence of pericardial effusion.   Mitral Valve: The mitral valve is normal in structure. Mild mitral valve  regurgitation. No evidence of mitral valve stenosis. MV peak gradient, 6.8  mmHg. The mean mitral valve gradient is 3.5 mmHg.   Tricuspid Valve: The tricuspid valve is normal in structure. Tricuspid  valve regurgitation is trivial. No evidence of tricuspid stenosis.   Aortic Valve: The aortic valve is calcified. Aortic valve regurgitation is  mild to moderate. Aortic regurgitation PHT measures 432 msec. Severe  aortic stenosis is present. Aortic valve mean gradient measures 30.6 mmHg.  Aortic  valve peak gradient measures   69.0 mmHg. Aortic valve area, by VTI measures 0.93 cm.   Pulmonic Valve: The pulmonic valve was normal in structure. Pulmonic valve  regurgitation is not visualized. No evidence of pulmonic stenosis.   Aorta: The aortic root is normal in size and structure.   Venous: The inferior vena cava is normal in size with greater than 50%  respiratory variability, suggesting right atrial pressure of 3 mmHg.   IAS/Shunts: No atrial level shunt detected by color flow Doppler.     LEFT VENTRICLE  PLAX 2D  LVIDd:         3.40 cm      Diastology  LVIDs:         1.80 cm      LV e' medial:    6.20 cm/s  LV PW:         0.90 cm      LV E/e' medial:  20.6  LV IVS:        1.00 cm      LV e' lateral:   5.11 cm/s  LVOT diam:     1.80 cm      LV E/e' lateral: 25.0  LV SV:         69  LV SV Index:   38  LVOT Area:     2.54 cm    LV Volumes (MOD)  LV vol d, MOD A2C: 92.7 ml  LV vol d, MOD A4C: 144.0 ml  LV vol s, MOD A2C: 29.2 ml  LV vol s, MOD A4C: 39.4 ml  LV SV MOD A2C:     63.5 ml  LV SV MOD A4C:     144.0 ml  LV SV MOD BP:      94.9 ml   RIGHT VENTRICLE             IVC  RV Basal diam:  3.10 cm     IVC diam: 1.20 cm  RV Mid diam:    2.70 cm  RV S prime:     14.60 cm/s  TAPSE (M-mode): 3.3 cm   LEFT ATRIUM             Index        RIGHT ATRIUM  Index  LA diam:        3.10 cm 1.71 cm/m   RA Area:     9.52 cm  LA Vol (A2C):   34.0 ml 18.76 ml/m  RA Volume:   18.50 ml 10.21 ml/m  LA Vol (A4C):   39.5 ml 21.79 ml/m  LA Biplane Vol: 39.9 ml 22.02 ml/m   AORTIC VALVE                     PULMONIC VALVE  AV Area (Vmax):    0.77 cm      PV Vmax:       1.25 m/s  AV Area (Vmean):   0.85 cm      PV Peak grad:  6.3 mmHg  AV Area (VTI):     0.93 cm  AV Vmax:           415.40 cm/s  AV Vmean:          236.600 cm/s  AV VTI:            0.747 m  AV Peak Grad:      69.0 mmHg  AV Mean Grad:      30.6 mmHg  LVOT Vmax:         126.00 cm/s  LVOT Vmean:         78.800 cm/s  LVOT VTI:          0.273 m  LVOT/AV VTI ratio: 0.37  AI PHT:            432 msec    AORTA  Ao Root diam: 2.40 cm  Ao Asc diam:  2.90 cm   MITRAL VALVE  MV Area (PHT): 3.60 cm     SHUNTS  MV Area VTI:   2.12 cm     Systemic VTI:  0.27 m  MV Peak grad:  6.8 mmHg     Systemic Diam: 1.80 cm  MV Mean grad:  3.5 mmHg  MV Vmax:       1.30 m/s  MV Vmean:      83.6 cm/s  MV Decel Time: 211 msec  MV E velocity: 128.00 cm/s  MV A velocity: 129.00 cm/s  MV E/A ratio:  0.99    CATH (03/2023) Conclusion   1.  Severe aortic stenosis with mean transvalvular gradient of 40 mmHg 2.  Widely patent coronary arteries with minimal irregularities and no significant stenosis    Recent Radiology Findings:   CTA (03/2023) FINDINGS: Image quality: Excellent.   Noise artifact is: Limited.   Valve Morphology: Tricuspid aortic valve with severely calcified and thickened leaflets. Restricted leaflet motion in systole.   Aortic Valve Calcium score: 1014   Aortic annular dimension:   Phase assessed: 25%   Annular area: 287 mm2   Annular perimeter: 62.1 mm   Max diameter: 22.1 mm   Min diameter: 16.8 mm   Annular and subannular calcification: None.   Membranous septum length: 7.5 mm   Optimal coplanar projection: LAO 1 CRA 1   Coronary Artery Height above Annulus:   Left Main: 8.3 mm   Right Coronary: 13.1 mm   Sinus of Valsalva Measurements:   Non-coronary: 22.7 mm   Right-coronary: 25.2 mm   Left-coronary: 26.8 mm   Sinus of Valsalva Height:   Non-coronary: 13.5 mm   Right-coronary: 17.1 mm   Left-coronary: 14.4 mm   Sinotubular Junction: 22.7 mm   Ascending Thoracic Aorta: 28.6 mm   Coronary Arteries: Normal coronary  origin. Right dominance. The study was performed without use of NTG and is insufficient for plaque evaluation. Please refer to recent cardiac catheterization for coronary assessment. 3-vessel coronary calcifications.   Cardiac  Morphology:   Right Atrium: Right atrial size is within normal limits.   Right Ventricle: The right ventricular cavity is within normal limits.   Left Atrium: Left atrial size is normal in size with no left atrial appendage filling defect.   Left Ventricle: The ventricular cavity size is within normal limits.   Pulmonary arteries: Normal in size without proximal filling defect.   Pulmonary veins: Normal pulmonary venous drainage.   Pericardium: Normal thickness with no significant effusion or calcium present.   Mitral Valve: The mitral valve is normal structure without significant calcification.   Extra-cardiac findings: See attached radiology report for non-cardiac structures.   IMPRESSION: 1. Small aortic annulus (287 mm2).   2. Annular measurements support a 20 mm S3, but shallow left main coronary height. Sinus heights are not favorable for a 23 mm Evolut Pro. Structural heart team discussion recommended.   3. No significant annular or subannular calcifications.   4. Low left main coronary artery height, 8.3 mm.   5. Optimal Fluoroscopic Angle for Delivery: LAO 1 CRA 1       Recent Lab Findings: Recent Labs       Lab Results  Component Value Date    WBC 8.9 04/30/2023    HGB 9.3 (L) 04/30/2023    HCT 30.4 (L) 04/30/2023    PLT 326 04/30/2023    GLUCOSE 125 (H) 04/30/2023    CHOL 153 02/03/2023    TRIG 135 02/03/2023    HDL 43 02/03/2023    LDLDIRECT 106 (H) 04/27/2023    LDLCALC 86 02/03/2023    ALT 18 04/28/2023    AST 17 04/28/2023    NA 138 04/30/2023    K 4.0 04/30/2023    CL 99 04/30/2023    CREATININE 1.83 (H) 04/30/2023    BUN 39 (H) 04/30/2023    CO2 25 04/30/2023    TSH 4.110 04/28/2023    INR 1.1 02/03/2022    HGBA1C 6.2 (H) 04/28/2023            Assessment / Plan:     Pt is a 68 yo female with NYHA class 3 symptoms of severe AS with normal LV function and no CAD> She unfortunately has a very small annulus which makes TAVR higher  risk for coronary obstruction. Her body habitus and immobility would make surgery higher risk which will need root replacement or annulus enlarging but overall I feel would be the best path. She understands and wishes to proceed after the new year. All the risks and goals and recovery were discussed. Surgery will be 1/3

## 2023-05-27 ENCOUNTER — Other Ambulatory Visit: Payer: Self-pay | Admitting: *Deleted

## 2023-05-27 DIAGNOSIS — I350 Nonrheumatic aortic (valve) stenosis: Secondary | ICD-10-CM

## 2023-05-27 NOTE — Progress Notes (Signed)
 On 05-27-23 @ 03:38 Dr. Leafy Ro called to cancel the surgery. I called [name] and left her a message informing her of the cancellation.

## 2023-05-27 NOTE — Progress Notes (Signed)
 Per Bernardino Sprang, RN,  Dr. Maryjane had to cancel pt's case today and r/s her for Tuesday 1/21.  Dr. Maryjane said do not bring her in for another PAT appt, just use the one form 12/31.  RB Pt will get same day T&S, CBC and BMET day of surgery but that's all per Dr. Maryjane

## 2023-06-13 MED ORDER — MANNITOL 20 % IV SOLN
INTRAVENOUS | Status: DC
  Filled 2023-06-13: qty 13

## 2023-06-13 MED ORDER — HEPARIN 30,000 UNITS/1000 ML (OHS) CELLSAVER SOLUTION
Status: DC
  Filled 2023-06-13: qty 1000

## 2023-06-13 MED ORDER — PLASMA-LYTE A IV SOLN
INTRAVENOUS | Status: DC
  Filled 2023-06-13: qty 2.5

## 2023-06-13 MED ORDER — NOREPINEPHRINE 4 MG/250ML-% IV SOLN
0.0000 ug/min | INTRAVENOUS | Status: DC
  Filled 2023-06-13: qty 250

## 2023-06-13 MED ORDER — INSULIN REGULAR(HUMAN) IN NACL 100-0.9 UT/100ML-% IV SOLN
INTRAVENOUS | Status: AC
  Administered 2023-06-14: 1.5 [IU]/h via INTRAVENOUS
  Filled 2023-06-13: qty 100

## 2023-06-13 MED ORDER — TRANEXAMIC ACID 1000 MG/10ML IV SOLN
1.5000 mg/kg/h | INTRAVENOUS | Status: AC
  Administered 2023-06-14: 1.5 mg/kg/h via INTRAVENOUS
  Filled 2023-06-13: qty 25

## 2023-06-13 MED ORDER — TRANEXAMIC ACID (OHS) PUMP PRIME SOLUTION
2.0000 mg/kg | INTRAVENOUS | Status: DC
  Filled 2023-06-13: qty 1.81

## 2023-06-13 MED ORDER — CEFAZOLIN SODIUM-DEXTROSE 2-4 GM/100ML-% IV SOLN
2.0000 g | INTRAVENOUS | Status: AC
  Administered 2023-06-14 (×2): 2 g via INTRAVENOUS
  Filled 2023-06-13: qty 100

## 2023-06-13 MED ORDER — VANCOMYCIN HCL 1500 MG/300ML IV SOLN
1500.0000 mg | INTRAVENOUS | Status: AC
  Administered 2023-06-14: 1500 mg via INTRAVENOUS
  Filled 2023-06-13: qty 300

## 2023-06-13 MED ORDER — NITROGLYCERIN IN D5W 200-5 MCG/ML-% IV SOLN
2.0000 ug/min | INTRAVENOUS | Status: DC
  Filled 2023-06-13: qty 250

## 2023-06-13 MED ORDER — TRANEXAMIC ACID (OHS) BOLUS VIA INFUSION
15.0000 mg/kg | INTRAVENOUS | Status: AC
Start: 2023-06-14 — End: 2023-06-15
  Administered 2023-06-14: 1360.5 mg via INTRAVENOUS
  Filled 2023-06-13: qty 1361

## 2023-06-13 MED ORDER — DEXMEDETOMIDINE HCL IN NACL 400 MCG/100ML IV SOLN
0.1000 ug/kg/h | INTRAVENOUS | Status: AC
  Administered 2023-06-14: .3 ug/kg/h via INTRAVENOUS
  Filled 2023-06-13: qty 100

## 2023-06-13 MED ORDER — POTASSIUM CHLORIDE 2 MEQ/ML IV SOLN
80.0000 meq | INTRAVENOUS | Status: DC
  Filled 2023-06-13: qty 40

## 2023-06-13 MED ORDER — MILRINONE LACTATE IN DEXTROSE 20-5 MG/100ML-% IV SOLN
0.3000 ug/kg/min | INTRAVENOUS | Status: DC
  Filled 2023-06-13: qty 100

## 2023-06-13 MED ORDER — CEFAZOLIN SODIUM-DEXTROSE 2-4 GM/100ML-% IV SOLN
2.0000 g | INTRAVENOUS | Status: DC
  Filled 2023-06-13: qty 100

## 2023-06-13 MED ORDER — VANCOMYCIN HCL 1000 MG IV SOLR
INTRAVENOUS | Status: DC
  Filled 2023-06-13: qty 20

## 2023-06-13 MED ORDER — VANCOMYCIN HCL 1.5 G IV SOLR
1500.0000 mg | INTRAVENOUS | Status: DC
  Filled 2023-06-13: qty 30

## 2023-06-13 MED ORDER — EPINEPHRINE HCL 5 MG/250ML IV SOLN IN NS
0.0000 ug/min | INTRAVENOUS | Status: AC
  Administered 2023-06-14: 2 ug/min via INTRAVENOUS
  Filled 2023-06-13: qty 250

## 2023-06-13 MED ORDER — PHENYLEPHRINE HCL-NACL 20-0.9 MG/250ML-% IV SOLN
30.0000 ug/min | INTRAVENOUS | Status: AC
  Administered 2023-06-14: 20 ug/min via INTRAVENOUS
  Filled 2023-06-13: qty 250

## 2023-06-13 NOTE — H&P (Signed)
[address]       [place] [address]             [phone]                                   [name] [facility] Medical Record #[id number] Date of Birth: 1955-10-31   Wendall Stade, MD [name], MD   Chief Complaint:   DOE and AS   History of Present Illness:     Pt is a 68 yo female who has been suffering from DOE and has been found to have severe AS with a mean gradient of and EF of 70%. Pt recently admitted with CHF at [facility] and was discharged. She has had a work up with echo and cath (no CAD) and on CTA work up was found to have very small annulus which is prohibitive for a sapien valve and for a medtronic would risk coronary obstruction. She is here to discuss options. She has had back surgeries which needed hardware removal and now walks with a cane at home             Past Medical History:  Diagnosis Date   Allergy     Anemia     Anxiety     Arthritis     Asthma     BMI 40.0-44.9, adult (HCC) 04/07/2014   DM type 2 (diabetes mellitus, type 2) (HCC)     GERD (gastroesophageal reflux disease)     HTN (hypertension)     Hypercholesterolemia     Osteoarthritis of left hip 04/07/2014   Reflux     Severe aortic stenosis     Spinal headache      with C-Section and with spinal fusion in 2019               Past Surgical History:  Procedure Laterality Date   CESAREAN SECTION        x3   COLONOSCOPY       LAMINECTOMY   03/2021   LEFT HEART CATH AND CORONARY ANGIOGRAPHY N/A 04/06/2023    Procedure: LEFT HEART CATH AND CORONARY ANGIOGRAPHY;  Surgeon: Tonny Bollman, MD;  Location: [facility] INVASIVE CV LAB;  Service: Cardiovascular;  Laterality: N/A;   SPINAL FUSION   2019   TUBAL LIGATION              Tobacco Use History  Social History       Tobacco Use  Smoking Status Never  Smokeless Tobacco Never      Social History        Substance and Sexual Activity  Alcohol Use No   Alcohol/week: 0.0  standard drinks of alcohol      Social History         Socioeconomic History   Marital status: Married      Spouse name: Not on file   Number of children: Not on file   Years of education: Not on file   Highest education level: Not on file  Occupational History   Not on file  Tobacco Use   Smoking status: Never   Smokeless tobacco: Never  Vaping Use   Vaping status: Never Used  Substance and Sexual Activity   Alcohol use: No      Alcohol/week: 0.0 standard drinks of alcohol   Drug use: No  Sexual activity: Yes  Other Topics Concern   Not on file  Social History Narrative   Not on file    Social Determinants of Health        Financial Resource Strain: Low Risk  (04/13/2023)    Received from [facility]    Overall Financial Resource Strain (CARDIA)     Difficulty of Paying Living Expenses: Not hard at all  Food Insecurity: No Food Insecurity (04/28/2023)    Hunger Vital Sign     Worried About Running Out of Food in the Last Year: Never true     Ran Out of Food in the Last Year: Never true  Transportation Needs: No Transportation Needs (04/28/2023)    PRAPARE - Therapist, art (Medical): No     Lack of Transportation (Non-Medical): No  Physical Activity: Inactive (04/13/2023)    Received from [facility]    Exercise Vital Sign     Days of Exercise per Week: 0 days     Minutes of Exercise per Session: 30 min  Stress: No Stress Concern Present (04/13/2023)    Received from [facility] - Occupational Stress Questionnaire     Feeling of Stress : Only a little  Social Connections: Somewhat Isolated (04/13/2023)    Received from [facility]    Social Network     How would you rate your social network (family, work, friends)?: Restricted participation with some degree of social isolation  Intimate Partner Violence: Not At Risk (04/28/2023)    Humiliation, Afraid, Rape, and Kick questionnaire      Fear of Current or Ex-Partner: No     Emotionally Abused: No     Physically Abused: No     Sexually Abused: No      Allergies       Allergies  Allergen Reactions   Crestor [Rosuvastatin] Other (See Comments)      Myalgia    Robaxin [Methocarbamol] Other (See Comments)      Insomnia   Toradol [Ketorolac Tromethamine] Other (See Comments)   Zocor [Simvastatin] Other (See Comments)      Myalgias    Lipitor [Atorvastatin] Other (See Comments)      Myalgia   Sulfa Antibiotics Hives              Current Outpatient Medications  Medication Sig Dispense Refill   acetaminophen (TYLENOL) 500 MG tablet Take 1,000 mg by mouth every 6 (six) hours as needed for mild pain (pain score 1-3) or headache.       albuterol (PROAIR HFA) 108 (90 BASE) MCG/ACT inhaler Inhale 2 puffs into the lungs every 6 (six) hours as needed. 1 Inhaler 12   amLODipine (NORVASC) 10 MG tablet Take 1 tablet (10 mg total) by mouth daily. (Patient taking differently: Take 10 mg by mouth at bedtime.) 90 tablet 1   ascorbic acid (VITAMIN C) 500 MG tablet Take 500 mg by mouth daily.       carvedilol (COREG) 25 MG tablet Take 1 tablet (25 mg total) by mouth 2 (two) times daily. (Patient not taking: Reported on 04/28/2023) 60 tablet 0   celecoxib (CELEBREX) 200 MG capsule Take 200 mg by mouth 2 (two) times daily.    1   cetirizine (ZYRTEC) 10 MG tablet Take 1 tablet (10 mg total) by mouth daily. 30 tablet 0   docusate sodium (COLACE) 100 MG capsule Take 200-300 mg by mouth at bedtime.  fluticasone (FLONASE) 50 MCG/ACT nasal spray Place 2 sprays into both nostrils daily. (Patient taking differently: Place 2 sprays into both nostrils daily as needed for rhinitis.) 16 g 0   furosemide (LASIX) 20 MG tablet Take 2 tablets (40 mg total) by mouth daily. 30 tablet 0   gabapentin (NEURONTIN) 600 MG tablet Take 600 mg by mouth 3 (three) times daily.       iron polysaccharides (NIFEREX) 150 MG capsule TAKE 1 CAPSULE (150 MG DOSE) BY  MOUTH TWICE A DAY (Patient taking differently: Take 150 mg by mouth daily.) 180 capsule 1   losartan-hydrochlorothiazide (HYZAAR) 100-25 MG tablet Take 1 tablet by mouth daily.       metFORMIN (GLUCOPHAGE) 1000 MG tablet Take 1 tablet (1,000 mg total) by mouth 2 (two) times daily with a meal. (Patient taking differently: Take 1,000 mg by mouth daily.) 180 tablet 1   pantoprazole (PROTONIX) 40 MG tablet Take 40 mg by mouth daily.       polyvinyl alcohol (LIQUIFILM TEARS) 1.4 % ophthalmic solution Place 1 drop into both eyes as needed for dry eyes.       rosuvastatin (CRESTOR) 10 MG tablet Take 1 tablet (10 mg total) by mouth every other day. 45 tablet 3   Semaglutide, 2 MG/DOSE, (OZEMPIC, 2 MG/DOSE,) 8 MG/3ML SOPN Inject 2 mg into the skin once a week.       sulfamethoxazole-trimethoprim (BACTRIM) 400-80 MG tablet TAKE 1 TABLET BY MOUTH TWICE DAILY 60 tablet 1      No current facility-administered medications for this visit.               Family History  Problem Relation Age of Onset   Hypertension Mother     Stroke Mother     Heart disease Father     Stroke Brother     Multiple sclerosis Brother     Multiple sclerosis Sister     Colon cancer Neg Hx     Colon polyps Neg Hx     Esophageal cancer Neg Hx     Rectal cancer Neg Hx     Stomach cancer Neg Hx                  Physical Exam: Lungs: clear Card: RR with harsh systolic murmur Ext: Warm Neuro intact Teeth in good repair         Diagnostic Studies & Laboratory data: I have personally reviewed the following studies and agree with the findings   TTE (04/2023) IMPRESSIONS     1. Left ventricular ejection fraction, by estimation, is 60 to 65%. The  left ventricle has normal function. The left ventricle has no regional  wall motion abnormalities. There is mild concentric left ventricular  hypertrophy. Left ventricular diastolic  parameters are consistent with Grade II diastolic dysfunction  (pseudonormalization).    2. Right ventricular systolic function is normal. The right ventricular  size is normal.   3. The mitral valve is normal in structure. Mild mitral valve  regurgitation. No evidence of mitral stenosis.   4. The aortic valve is severely calcified but otherwise not well  visualized. Aortic valve regurgitation is mild to moderate. Severe aortic  valve stenosis with mean gradient of (manually measured) and AVA by  VTI less than 1cm2.   5. The inferior vena cava is normal in size with greater than 50%  respiratory variability, suggesting right atrial pressure of 3 mmHg.   FINDINGS   Left Ventricle: Left ventricular ejection  fraction, by estimation, is 60  to 65%. The left ventricle has normal function. The left ventricle has no  regional wall motion abnormalities. The left ventricular internal cavity  size was normal in size. There is   mild concentric left ventricular hypertrophy. Left ventricular diastolic  parameters are consistent with Grade II diastolic dysfunction  (pseudonormalization).   Right Ventricle: The right ventricular size is normal. No increase in  right ventricular wall thickness. Right ventricular systolic function is  normal.   Left Atrium: Left atrial size was normal in size.   Right Atrium: Right atrial size was normal in size.   Pericardium: There is no evidence of pericardial effusion.   Mitral Valve: The mitral valve is normal in structure. Mild mitral valve  regurgitation. No evidence of mitral valve stenosis. MV peak gradient, 6.8  mmHg. The mean mitral valve gradient is 3.5 mmHg.   Tricuspid Valve: The tricuspid valve is normal in structure. Tricuspid  valve regurgitation is trivial. No evidence of tricuspid stenosis.   Aortic Valve: The aortic valve is calcified. Aortic valve regurgitation is  mild to moderate. Aortic regurgitation PHT measures 432 msec. Severe  aortic stenosis is present. Aortic valve mean gradient measures 30.6 mmHg.  Aortic  valve peak gradient measures   69.0 mmHg. Aortic valve area, by VTI measures 0.93 cm.   Pulmonic Valve: The pulmonic valve was normal in structure. Pulmonic valve  regurgitation is not visualized. No evidence of pulmonic stenosis.   Aorta: The aortic root is normal in size and structure.   Venous: The inferior vena cava is normal in size with greater than 50%  respiratory variability, suggesting right atrial pressure of 3 mmHg.   IAS/Shunts: No atrial level shunt detected by color flow Doppler.     LEFT VENTRICLE  PLAX 2D  LVIDd:         3.40 cm      Diastology  LVIDs:         1.80 cm      LV e' medial:    6.20 cm/s  LV PW:         0.90 cm      LV E/e' medial:  20.6  LV IVS:        1.00 cm      LV e' lateral:   5.11 cm/s  LVOT diam:     1.80 cm      LV E/e' lateral: 25.0  LV SV:         69  LV SV Index:   38  LVOT Area:     2.54 cm    LV Volumes (MOD)  LV vol d, MOD A2C: 92.7 ml  LV vol d, MOD A4C: 144.0 ml  LV vol s, MOD A2C: 29.2 ml  LV vol s, MOD A4C: 39.4 ml  LV SV MOD A2C:     63.5 ml  LV SV MOD A4C:     144.0 ml  LV SV MOD BP:      94.9 ml   RIGHT VENTRICLE             IVC  RV Basal diam:  3.10 cm     IVC diam: 1.20 cm  RV Mid diam:    2.70 cm  RV S prime:     14.60 cm/s  TAPSE (M-mode): 3.3 cm   LEFT ATRIUM             Index        RIGHT ATRIUM  Index  LA diam:        3.10 cm 1.71 cm/m   RA Area:     9.52 cm  LA Vol (A2C):   34.0 ml 18.76 ml/m  RA Volume:   18.50 ml 10.21 ml/m  LA Vol (A4C):   39.5 ml 21.79 ml/m  LA Biplane Vol: 39.9 ml 22.02 ml/m   AORTIC VALVE                     PULMONIC VALVE  AV Area (Vmax):    0.77 cm      PV Vmax:       1.25 m/s  AV Area (Vmean):   0.85 cm      PV Peak grad:  6.3 mmHg  AV Area (VTI):     0.93 cm  AV Vmax:           415.40 cm/s  AV Vmean:          236.600 cm/s  AV VTI:            0.747 m  AV Peak Grad:      69.0 mmHg  AV Mean Grad:      30.6 mmHg  LVOT Vmax:         126.00 cm/s  LVOT Vmean:         78.800 cm/s  LVOT VTI:          0.273 m  LVOT/AV VTI ratio: 0.37  AI PHT:            432 msec    AORTA  Ao Root diam: 2.40 cm  Ao Asc diam:  2.90 cm   MITRAL VALVE  MV Area (PHT): 3.60 cm     SHUNTS  MV Area VTI:   2.12 cm     Systemic VTI:  0.27 m  MV Peak grad:  6.8 mmHg     Systemic Diam: 1.80 cm  MV Mean grad:  3.5 mmHg  MV Vmax:       1.30 m/s  MV Vmean:      83.6 cm/s  MV Decel Time: 211 msec  MV E velocity: 128.00 cm/s  MV A velocity: 129.00 cm/s  MV E/A ratio:  0.99    CATH (03/2023) Conclusion   1.  Severe aortic stenosis with mean transvalvular gradient of 40 mmHg 2.  Widely patent coronary arteries with minimal irregularities and no significant stenosis    Recent Radiology Findings:   CTA (03/2023) FINDINGS: Image quality: Excellent.   Noise artifact is: Limited.   Valve Morphology: Tricuspid aortic valve with severely calcified and thickened leaflets. Restricted leaflet motion in systole.   Aortic Valve Calcium score: 1014   Aortic annular dimension:   Phase assessed: 25%   Annular area: 287 mm2   Annular perimeter: 62.1 mm   Max diameter: 22.1 mm   Min diameter: 16.8 mm   Annular and subannular calcification: None.   Membranous septum length: 7.5 mm   Optimal coplanar projection: LAO 1 CRA 1   Coronary Artery Height above Annulus:   Left Main: 8.3 mm   Right Coronary: 13.1 mm   Sinus of Valsalva Measurements:   Non-coronary: 22.7 mm   Right-coronary: 25.2 mm   Left-coronary: 26.8 mm   Sinus of Valsalva Height:   Non-coronary: 13.5 mm   Right-coronary: 17.1 mm   Left-coronary: 14.4 mm   Sinotubular Junction: 22.7 mm   Ascending Thoracic Aorta: 28.6 mm   Coronary Arteries: Normal coronary  origin. Right dominance. The study was performed without use of NTG and is insufficient for plaque evaluation. Please refer to recent cardiac catheterization for coronary assessment. 3-vessel coronary calcifications.   Cardiac  Morphology:   Right Atrium: Right atrial size is within normal limits.   Right Ventricle: The right ventricular cavity is within normal limits.   Left Atrium: Left atrial size is normal in size with no left atrial appendage filling defect.   Left Ventricle: The ventricular cavity size is within normal limits.   Pulmonary arteries: Normal in size without proximal filling defect.   Pulmonary veins: Normal pulmonary venous drainage.   Pericardium: Normal thickness with no significant effusion or calcium present.   Mitral Valve: The mitral valve is normal structure without significant calcification.   Extra-cardiac findings: See attached radiology report for non-cardiac structures.   IMPRESSION: 1. Small aortic annulus (287 mm2).   2. Annular measurements support a 20 mm S3, but shallow left main coronary height. Sinus heights are not favorable for a 23 mm Evolut Pro. Structural heart team discussion recommended.   3. No significant annular or subannular calcifications.   4. Low left main coronary artery height, 8.3 mm.   5. Optimal Fluoroscopic Angle for Delivery: LAO 1 CRA 1       Recent Lab Findings: Recent Labs       Lab Results  Component Value Date    WBC 8.9 04/30/2023    HGB 9.3 (L) 04/30/2023    HCT 30.4 (L) 04/30/2023    PLT 326 04/30/2023    GLUCOSE 125 (H) 04/30/2023    CHOL 153 02/03/2023    TRIG 135 02/03/2023    HDL 43 02/03/2023    LDLDIRECT 106 (H) 04/27/2023    LDLCALC 86 02/03/2023    ALT 18 04/28/2023    AST 17 04/28/2023    NA 138 04/30/2023    K 4.0 04/30/2023    CL 99 04/30/2023    CREATININE 1.83 (H) 04/30/2023    BUN 39 (H) 04/30/2023    CO2 25 04/30/2023    TSH 4.110 04/28/2023    INR 1.1 02/03/2022    HGBA1C 6.2 (H) 04/28/2023            Assessment / Plan:     Pt is a 68 yo female with NYHA class 3 symptoms of severe AS with normal LV function and no CAD> She unfortunately has a very small annulus which makes TAVR higher  risk for coronary obstruction. Her body habitus and immobility would make surgery higher risk which will need root replacement or annulus enlarging but overall I feel would be the best path. She understands and wishes to proceed after the new year. All the risks and goals and recovery were discussed. Surgery will be 1/3

## 2023-06-14 ENCOUNTER — Inpatient Hospital Stay (HOSPITAL_COMMUNITY): Payer: Medicare HMO

## 2023-06-14 ENCOUNTER — Other Ambulatory Visit: Payer: Self-pay

## 2023-06-14 ENCOUNTER — Inpatient Hospital Stay (HOSPITAL_COMMUNITY): Payer: Medicare HMO | Admitting: Anesthesiology

## 2023-06-14 ENCOUNTER — Inpatient Hospital Stay (HOSPITAL_COMMUNITY): Payer: Medicare HMO | Admitting: Physician Assistant

## 2023-06-14 ENCOUNTER — Inpatient Hospital Stay (HOSPITAL_COMMUNITY)
Admission: RE | Admit: 2023-06-14 | Discharge: 2023-06-24 | DRG: 220 | Disposition: A | Discharge status: Home Health | Payer: Medicare HMO | Attending: Thoracic Surgery (Cardiothoracic Vascular Surgery) | Admitting: Thoracic Surgery (Cardiothoracic Vascular Surgery)

## 2023-06-14 ENCOUNTER — Encounter (HOSPITAL_COMMUNITY)
Admission: RE | Disposition: A | Discharge status: Home Health | Payer: Self-pay | Source: Home / Self Care | Attending: Thoracic Surgery (Cardiothoracic Vascular Surgery)

## 2023-06-14 DIAGNOSIS — J45909 Unspecified asthma, uncomplicated: Secondary | ICD-10-CM | POA: Diagnosis present

## 2023-06-14 DIAGNOSIS — M109 Gout, unspecified: Secondary | ICD-10-CM | POA: Diagnosis present

## 2023-06-14 DIAGNOSIS — D631 Anemia in chronic kidney disease: Secondary | ICD-10-CM | POA: Diagnosis present

## 2023-06-14 DIAGNOSIS — I130 Hypertensive heart and chronic kidney disease with heart failure and stage 1 through stage 4 chronic kidney disease, or unspecified chronic kidney disease: Secondary | ICD-10-CM

## 2023-06-14 DIAGNOSIS — Z7985 Long-term (current) use of injectable non-insulin antidiabetic drugs: Secondary | ICD-10-CM | POA: Diagnosis not present

## 2023-06-14 DIAGNOSIS — I161 Hypertensive emergency: Secondary | ICD-10-CM | POA: Diagnosis present

## 2023-06-14 DIAGNOSIS — R509 Fever, unspecified: Secondary | ICD-10-CM

## 2023-06-14 DIAGNOSIS — I97190 Other postprocedural cardiac functional disturbances following cardiac surgery: Secondary | ICD-10-CM | POA: Diagnosis not present

## 2023-06-14 DIAGNOSIS — Z7984 Long term (current) use of oral hypoglycemic drugs: Secondary | ICD-10-CM

## 2023-06-14 DIAGNOSIS — N179 Acute kidney failure, unspecified: Secondary | ICD-10-CM

## 2023-06-14 DIAGNOSIS — E7800 Pure hypercholesterolemia, unspecified: Secondary | ICD-10-CM | POA: Diagnosis present

## 2023-06-14 DIAGNOSIS — Z604 Social exclusion and rejection: Secondary | ICD-10-CM | POA: Diagnosis present

## 2023-06-14 DIAGNOSIS — Z823 Family history of stroke: Secondary | ICD-10-CM

## 2023-06-14 DIAGNOSIS — E66813 Obesity, class 3: Secondary | ICD-10-CM | POA: Diagnosis present

## 2023-06-14 DIAGNOSIS — J158 Pneumonia due to other specified bacteria: Secondary | ICD-10-CM | POA: Diagnosis not present

## 2023-06-14 DIAGNOSIS — M159 Polyosteoarthritis, unspecified: Secondary | ICD-10-CM | POA: Diagnosis present

## 2023-06-14 DIAGNOSIS — Z7982 Long term (current) use of aspirin: Secondary | ICD-10-CM

## 2023-06-14 DIAGNOSIS — N1832 Chronic kidney disease, stage 3b: Secondary | ICD-10-CM | POA: Diagnosis present

## 2023-06-14 DIAGNOSIS — R739 Hyperglycemia, unspecified: Secondary | ICD-10-CM

## 2023-06-14 DIAGNOSIS — Z981 Arthrodesis status: Secondary | ICD-10-CM

## 2023-06-14 DIAGNOSIS — D72829 Elevated white blood cell count, unspecified: Secondary | ICD-10-CM | POA: Diagnosis not present

## 2023-06-14 DIAGNOSIS — Z888 Allergy status to other drugs, medicaments and biological substances status: Secondary | ICD-10-CM

## 2023-06-14 DIAGNOSIS — Z882 Allergy status to sulfonamides status: Secondary | ICD-10-CM

## 2023-06-14 DIAGNOSIS — I350 Nonrheumatic aortic (valve) stenosis: Secondary | ICD-10-CM

## 2023-06-14 DIAGNOSIS — E785 Hyperlipidemia, unspecified: Secondary | ICD-10-CM | POA: Diagnosis not present

## 2023-06-14 DIAGNOSIS — I4892 Unspecified atrial flutter: Secondary | ICD-10-CM | POA: Diagnosis present

## 2023-06-14 DIAGNOSIS — I5032 Chronic diastolic (congestive) heart failure: Secondary | ICD-10-CM | POA: Diagnosis present

## 2023-06-14 DIAGNOSIS — A498 Other bacterial infections of unspecified site: Secondary | ICD-10-CM | POA: Diagnosis not present

## 2023-06-14 DIAGNOSIS — Z6841 Body Mass Index (BMI) 40.0 and over, adult: Secondary | ICD-10-CM

## 2023-06-14 DIAGNOSIS — F419 Anxiety disorder, unspecified: Secondary | ICD-10-CM | POA: Diagnosis present

## 2023-06-14 DIAGNOSIS — N1831 Chronic kidney disease, stage 3a: Secondary | ICD-10-CM | POA: Diagnosis not present

## 2023-06-14 DIAGNOSIS — I472 Ventricular tachycardia, unspecified: Secondary | ICD-10-CM | POA: Diagnosis present

## 2023-06-14 DIAGNOSIS — I4891 Unspecified atrial fibrillation: Secondary | ICD-10-CM | POA: Diagnosis not present

## 2023-06-14 DIAGNOSIS — Z952 Presence of prosthetic heart valve: Principal | ICD-10-CM

## 2023-06-14 DIAGNOSIS — Z820 Family history of epilepsy and other diseases of the nervous system: Secondary | ICD-10-CM

## 2023-06-14 DIAGNOSIS — M25572 Pain in left ankle and joints of left foot: Secondary | ICD-10-CM | POA: Diagnosis not present

## 2023-06-14 DIAGNOSIS — Z79899 Other long term (current) drug therapy: Secondary | ICD-10-CM

## 2023-06-14 DIAGNOSIS — M25571 Pain in right ankle and joints of right foot: Secondary | ICD-10-CM | POA: Diagnosis not present

## 2023-06-14 DIAGNOSIS — I442 Atrioventricular block, complete: Secondary | ICD-10-CM

## 2023-06-14 DIAGNOSIS — Z8249 Family history of ischemic heart disease and other diseases of the circulatory system: Secondary | ICD-10-CM

## 2023-06-14 DIAGNOSIS — E1165 Type 2 diabetes mellitus with hyperglycemia: Secondary | ICD-10-CM | POA: Diagnosis present

## 2023-06-14 DIAGNOSIS — E119 Type 2 diabetes mellitus without complications: Secondary | ICD-10-CM | POA: Diagnosis not present

## 2023-06-14 DIAGNOSIS — I480 Paroxysmal atrial fibrillation: Secondary | ICD-10-CM | POA: Diagnosis present

## 2023-06-14 DIAGNOSIS — I10 Essential (primary) hypertension: Secondary | ICD-10-CM | POA: Diagnosis not present

## 2023-06-14 DIAGNOSIS — E1122 Type 2 diabetes mellitus with diabetic chronic kidney disease: Secondary | ICD-10-CM | POA: Diagnosis present

## 2023-06-14 DIAGNOSIS — D696 Thrombocytopenia, unspecified: Secondary | ICD-10-CM | POA: Diagnosis not present

## 2023-06-14 DIAGNOSIS — D62 Acute posthemorrhagic anemia: Secondary | ICD-10-CM | POA: Diagnosis not present

## 2023-06-14 DIAGNOSIS — K219 Gastro-esophageal reflux disease without esophagitis: Secondary | ICD-10-CM | POA: Diagnosis present

## 2023-06-14 DIAGNOSIS — M79604 Pain in right leg: Secondary | ICD-10-CM | POA: Diagnosis not present

## 2023-06-14 DIAGNOSIS — I509 Heart failure, unspecified: Secondary | ICD-10-CM

## 2023-06-14 HISTORY — PX: TEE WITHOUT CARDIOVERSION: SHX5443

## 2023-06-14 HISTORY — PX: ASCENDING AORTIC ROOT REPLACEMENT: SHX5729

## 2023-06-14 LAB — POCT I-STAT EG7
Acid-Base Excess: 2 mmol/L (ref 0.0–2.0)
Bicarbonate: 28.2 mmol/L — ABNORMAL HIGH (ref 20.0–28.0)
Calcium, Ion: 1.11 mmol/L — ABNORMAL LOW (ref 1.15–1.40)
HCT: 20 % — ABNORMAL LOW (ref 36.0–46.0)
Hemoglobin: 6.8 g/dL — CL (ref 12.0–15.0)
O2 Saturation: 63 %
Potassium: 5.3 mmol/L — ABNORMAL HIGH (ref 3.5–5.1)
Sodium: 139 mmol/L (ref 135–145)
TCO2: 30 mmol/L (ref 22–32)
pCO2, Ven: 51.7 mm[Hg] (ref 44–60)
pH, Ven: 7.344 (ref 7.25–7.43)
pO2, Ven: 35 mm[Hg] (ref 32–45)

## 2023-06-14 LAB — POCT I-STAT 7, (LYTES, BLD GAS, ICA,H+H)
Acid-Base Excess: 3 mmol/L — ABNORMAL HIGH (ref 0.0–2.0)
Acid-Base Excess: 2 mmol/L (ref 0.0–2.0)
Acid-Base Excess: 1 mmol/L (ref 0.0–2.0)
Acid-Base Excess: 0 mmol/L (ref 0.0–2.0)
Acid-base deficit: 2 mmol/L (ref 0.0–2.0)
Acid-base deficit: 3 mmol/L — ABNORMAL HIGH (ref 0.0–2.0)
Acid-base deficit: 1 mmol/L (ref 0.0–2.0)
Acid-base deficit: 3 mmol/L — ABNORMAL HIGH (ref 0.0–2.0)
Bicarbonate: 28.6 mmol/L — ABNORMAL HIGH (ref 20.0–28.0)
Bicarbonate: 26.6 mmol/L (ref 20.0–28.0)
Bicarbonate: 25.1 mmol/L (ref 20.0–28.0)
Bicarbonate: 24.4 mmol/L (ref 20.0–28.0)
Bicarbonate: 24.9 mmol/L (ref 20.0–28.0)
Bicarbonate: 26.2 mmol/L (ref 20.0–28.0)
Bicarbonate: 24 mmol/L (ref 20.0–28.0)
Bicarbonate: 26.5 mmol/L (ref 20.0–28.0)
Calcium, Ion: 1.08 mmol/L — ABNORMAL LOW (ref 1.15–1.40)
Calcium, Ion: 1.13 mmol/L — ABNORMAL LOW (ref 1.15–1.40)
Calcium, Ion: 1.13 mmol/L — ABNORMAL LOW (ref 1.15–1.40)
Calcium, Ion: 1.13 mmol/L — ABNORMAL LOW (ref 1.15–1.40)
Calcium, Ion: 1.16 mmol/L (ref 1.15–1.40)
Calcium, Ion: 1.18 mmol/L (ref 1.15–1.40)
Calcium, Ion: 1.19 mmol/L (ref 1.15–1.40)
Calcium, Ion: 1.14 mmol/L — ABNORMAL LOW (ref 1.15–1.40)
HCT: 22 % — ABNORMAL LOW (ref 36.0–46.0)
HCT: 22 % — ABNORMAL LOW (ref 36.0–46.0)
HCT: 24 % — ABNORMAL LOW (ref 36.0–46.0)
HCT: 21 % — ABNORMAL LOW (ref 36.0–46.0)
HCT: 31 % — ABNORMAL LOW (ref 36.0–46.0)
HCT: 33 % — ABNORMAL LOW (ref 36.0–46.0)
HCT: 32 % — ABNORMAL LOW (ref 36.0–46.0)
HCT: 30 % — ABNORMAL LOW (ref 36.0–46.0)
Hemoglobin: 7.5 g/dL — ABNORMAL LOW (ref 12.0–15.0)
Hemoglobin: 7.5 g/dL — ABNORMAL LOW (ref 12.0–15.0)
Hemoglobin: 8.2 g/dL — ABNORMAL LOW (ref 12.0–15.0)
Hemoglobin: 7.1 g/dL — ABNORMAL LOW (ref 12.0–15.0)
Hemoglobin: 10.5 g/dL — ABNORMAL LOW (ref 12.0–15.0)
Hemoglobin: 11.2 g/dL — ABNORMAL LOW (ref 12.0–15.0)
Hemoglobin: 10.9 g/dL — ABNORMAL LOW (ref 12.0–15.0)
Hemoglobin: 10.2 g/dL — ABNORMAL LOW (ref 12.0–15.0)
O2 Saturation: 100 %
O2 Saturation: 100 %
O2 Saturation: 100 %
O2 Saturation: 100 %
O2 Saturation: 87 %
O2 Saturation: 97 %
O2 Saturation: 98 %
O2 Saturation: 98 %
Patient temperature: 36
Patient temperature: 36.3
Patient temperature: 36.6
Patient temperature: 37.1
Potassium: 4 mmol/L (ref 3.5–5.1)
Potassium: 4.9 mmol/L (ref 3.5–5.1)
Potassium: 4.8 mmol/L (ref 3.5–5.1)
Potassium: 5 mmol/L (ref 3.5–5.1)
Potassium: 4.4 mmol/L (ref 3.5–5.1)
Potassium: 4.9 mmol/L (ref 3.5–5.1)
Potassium: 4.7 mmol/L (ref 3.5–5.1)
Potassium: 5.7 mmol/L — ABNORMAL HIGH (ref 3.5–5.1)
Sodium: 140 mmol/L (ref 135–145)
Sodium: 137 mmol/L (ref 135–145)
Sodium: 137 mmol/L (ref 135–145)
Sodium: 137 mmol/L (ref 135–145)
Sodium: 140 mmol/L (ref 135–145)
Sodium: 139 mmol/L (ref 135–145)
Sodium: 139 mmol/L (ref 135–145)
Sodium: 139 mmol/L (ref 135–145)
TCO2: 30 mmol/L (ref 22–32)
TCO2: 28 mmol/L (ref 22–32)
TCO2: 26 mmol/L (ref 22–32)
TCO2: 26 mmol/L (ref 22–32)
TCO2: 27 mmol/L (ref 22–32)
TCO2: 28 mmol/L (ref 22–32)
TCO2: 25 mmol/L (ref 22–32)
TCO2: 28 mmol/L (ref 22–32)
pCO2 arterial: 45.6 mm[Hg] (ref 32–48)
pCO2 arterial: 38.2 mm[Hg] (ref 32–48)
pCO2 arterial: 37.5 mm[Hg] (ref 32–48)
pCO2 arterial: 50.1 mm[Hg] — ABNORMAL HIGH (ref 32–48)
pCO2 arterial: 54.5 mm[Hg] — ABNORMAL HIGH (ref 32–48)
pCO2 arterial: 50.6 mm[Hg] — ABNORMAL HIGH (ref 32–48)
pCO2 arterial: 48.8 mm[Hg] — ABNORMAL HIGH (ref 32–48)
pCO2 arterial: 52.4 mm[Hg] — ABNORMAL HIGH (ref 32–48)
pH, Arterial: 7.404 (ref 7.35–7.45)
pH, Arterial: 7.45 (ref 7.35–7.45)
pH, Arterial: 7.434 (ref 7.35–7.45)
pH, Arterial: 7.295 — ABNORMAL LOW (ref 7.35–7.45)
pH, Arterial: 7.263 — ABNORMAL LOW (ref 7.35–7.45)
pH, Arterial: 7.319 — ABNORMAL LOW (ref 7.35–7.45)
pH, Arterial: 7.297 — ABNORMAL LOW (ref 7.35–7.45)
pH, Arterial: 7.313 — ABNORMAL LOW (ref 7.35–7.45)
pO2, Arterial: 299 mm[Hg] — ABNORMAL HIGH (ref 83–108)
pO2, Arterial: 318 mm[Hg] — ABNORMAL HIGH (ref 83–108)
pO2, Arterial: 301 mm[Hg] — ABNORMAL HIGH (ref 83–108)
pO2, Arterial: 227 mm[Hg] — ABNORMAL HIGH (ref 83–108)
pO2, Arterial: 59 mm[Hg] — ABNORMAL LOW (ref 83–108)
pO2, Arterial: 98 mm[Hg] (ref 83–108)
pO2, Arterial: 111 mm[Hg] — ABNORMAL HIGH (ref 83–108)
pO2, Arterial: 110 mm[Hg] — ABNORMAL HIGH (ref 83–108)

## 2023-06-14 LAB — POCT I-STAT, CHEM 8
BUN: 17 mg/dL (ref 8–23)
BUN: 18 mg/dL (ref 8–23)
BUN: 17 mg/dL (ref 8–23)
BUN: 18 mg/dL (ref 8–23)
BUN: 18 mg/dL (ref 8–23)
BUN: 20 mg/dL (ref 8–23)
Calcium, Ion: 1.25 mmol/L (ref 1.15–1.40)
Calcium, Ion: 1.25 mmol/L (ref 1.15–1.40)
Calcium, Ion: 1.12 mmol/L — ABNORMAL LOW (ref 1.15–1.40)
Calcium, Ion: 1.14 mmol/L — ABNORMAL LOW (ref 1.15–1.40)
Calcium, Ion: 1.12 mmol/L — ABNORMAL LOW (ref 1.15–1.40)
Calcium, Ion: 1.13 mmol/L — ABNORMAL LOW (ref 1.15–1.40)
Chloride: 107 mmol/L (ref 98–111)
Chloride: 104 mmol/L (ref 98–111)
Chloride: 102 mmol/L (ref 98–111)
Chloride: 103 mmol/L (ref 98–111)
Chloride: 103 mmol/L (ref 98–111)
Chloride: 104 mmol/L (ref 98–111)
Creatinine, Ser: 1.3 mg/dL — ABNORMAL HIGH (ref 0.44–1.00)
Creatinine, Ser: 1.3 mg/dL — ABNORMAL HIGH (ref 0.44–1.00)
Creatinine, Ser: 1.2 mg/dL — ABNORMAL HIGH (ref 0.44–1.00)
Creatinine, Ser: 1.3 mg/dL — ABNORMAL HIGH (ref 0.44–1.00)
Creatinine, Ser: 1.3 mg/dL — ABNORMAL HIGH (ref 0.44–1.00)
Creatinine, Ser: 1.3 mg/dL — ABNORMAL HIGH (ref 0.44–1.00)
Glucose, Bld: 111 mg/dL — ABNORMAL HIGH (ref 70–99)
Glucose, Bld: 126 mg/dL — ABNORMAL HIGH (ref 70–99)
Glucose, Bld: 125 mg/dL — ABNORMAL HIGH (ref 70–99)
Glucose, Bld: 191 mg/dL — ABNORMAL HIGH (ref 70–99)
Glucose, Bld: 187 mg/dL — ABNORMAL HIGH (ref 70–99)
Glucose, Bld: 239 mg/dL — ABNORMAL HIGH (ref 70–99)
HCT: 29 % — ABNORMAL LOW (ref 36.0–46.0)
HCT: 27 % — ABNORMAL LOW (ref 36.0–46.0)
HCT: 23 % — ABNORMAL LOW (ref 36.0–46.0)
HCT: 24 % — ABNORMAL LOW (ref 36.0–46.0)
HCT: 23 % — ABNORMAL LOW (ref 36.0–46.0)
HCT: 26 % — ABNORMAL LOW (ref 36.0–46.0)
Hemoglobin: 9.9 g/dL — ABNORMAL LOW (ref 12.0–15.0)
Hemoglobin: 9.2 g/dL — ABNORMAL LOW (ref 12.0–15.0)
Hemoglobin: 7.8 g/dL — ABNORMAL LOW (ref 12.0–15.0)
Hemoglobin: 8.2 g/dL — ABNORMAL LOW (ref 12.0–15.0)
Hemoglobin: 7.8 g/dL — ABNORMAL LOW (ref 12.0–15.0)
Hemoglobin: 8.8 g/dL — ABNORMAL LOW (ref 12.0–15.0)
Potassium: 3.9 mmol/L (ref 3.5–5.1)
Potassium: 3.8 mmol/L (ref 3.5–5.1)
Potassium: 4.9 mmol/L (ref 3.5–5.1)
Potassium: 5.2 mmol/L — ABNORMAL HIGH (ref 3.5–5.1)
Potassium: 5.1 mmol/L (ref 3.5–5.1)
Potassium: 5.7 mmol/L — ABNORMAL HIGH (ref 3.5–5.1)
Sodium: 142 mmol/L (ref 135–145)
Sodium: 140 mmol/L (ref 135–145)
Sodium: 140 mmol/L (ref 135–145)
Sodium: 137 mmol/L (ref 135–145)
Sodium: 136 mmol/L (ref 135–145)
Sodium: 137 mmol/L (ref 135–145)
TCO2: 26 mmol/L (ref 22–32)
TCO2: 27 mmol/L (ref 22–32)
TCO2: 27 mmol/L (ref 22–32)
TCO2: 26 mmol/L (ref 22–32)
TCO2: 23 mmol/L (ref 22–32)
TCO2: 25 mmol/L (ref 22–32)

## 2023-06-14 LAB — COMPREHENSIVE METABOLIC PANEL
ALT: 21 U/L (ref 0–44)
AST: 22 U/L (ref 15–41)
Albumin: 3.8 g/dL (ref 3.5–5.0)
Alkaline Phosphatase: 69 U/L (ref 38–126)
Anion gap: 8 (ref 5–15)
BUN: 16 mg/dL (ref 8–23)
CO2: 27 mmol/L (ref 22–32)
Calcium: 9.5 mg/dL (ref 8.9–10.3)
Chloride: 106 mmol/L (ref 98–111)
Creatinine, Ser: 1.34 mg/dL — ABNORMAL HIGH (ref 0.44–1.00)
GFR, Estimated: 44 mL/min — ABNORMAL LOW (ref >60)
Glucose, Bld: 115 mg/dL — ABNORMAL HIGH (ref 70–99)
Potassium: 3.8 mmol/L (ref 3.5–5.1)
Sodium: 141 mmol/L (ref 135–145)
Total Bilirubin: 0.4 mg/dL (ref 0.0–1.2)
Total Protein: 7 g/dL (ref 6.5–8.1)

## 2023-06-14 LAB — CBC
HCT: 33.8 % — ABNORMAL LOW (ref 36.0–46.0)
HCT: 32 % — ABNORMAL LOW (ref 36.0–46.0)
HCT: 33.4 % — ABNORMAL LOW (ref 36.0–46.0)
Hemoglobin: 10.3 g/dL — ABNORMAL LOW (ref 12.0–15.0)
Hemoglobin: 10 g/dL — ABNORMAL LOW (ref 12.0–15.0)
Hemoglobin: 10.6 g/dL — ABNORMAL LOW (ref 12.0–15.0)
MCH: 24.3 pg — ABNORMAL LOW (ref 26.0–34.0)
MCH: 25.4 pg — ABNORMAL LOW (ref 26.0–34.0)
MCH: 25.4 pg — ABNORMAL LOW (ref 26.0–34.0)
MCHC: 30.5 g/dL (ref 30.0–36.0)
MCHC: 31.3 g/dL (ref 30.0–36.0)
MCHC: 31.7 g/dL (ref 30.0–36.0)
MCV: 79.9 fL — ABNORMAL LOW (ref 80.0–100.0)
MCV: 81.2 fL (ref 80.0–100.0)
MCV: 80.1 fL (ref 80.0–100.0)
Platelets: 270 10*3/uL (ref 150–400)
Platelets: 188 10*3/uL (ref 150–400)
Platelets: 181 10*3/uL (ref 150–400)
RBC: 4.23 MIL/uL (ref 3.87–5.11)
RBC: 3.94 MIL/uL (ref 3.87–5.11)
RBC: 4.17 MIL/uL (ref 3.87–5.11)
RDW: 18.1 % — ABNORMAL HIGH (ref 11.5–15.5)
RDW: 16.9 % — ABNORMAL HIGH (ref 11.5–15.5)
RDW: 17.2 % — ABNORMAL HIGH (ref 11.5–15.5)
WBC: 5.1 10*3/uL (ref 4.0–10.5)
WBC: 19.9 10*3/uL — ABNORMAL HIGH (ref 4.0–10.5)
WBC: 12.8 10*3/uL — ABNORMAL HIGH (ref 4.0–10.5)
nRBC: 0 % (ref 0.0–0.2)
nRBC: 0 % (ref 0.0–0.2)
nRBC: 0 % (ref 0.0–0.2)

## 2023-06-14 LAB — BASIC METABOLIC PANEL
Anion gap: 10 (ref 5–15)
BUN: 15 mg/dL (ref 8–23)
CO2: 23 mmol/L (ref 22–32)
Calcium: 8.4 mg/dL — ABNORMAL LOW (ref 8.9–10.3)
Chloride: 106 mmol/L (ref 98–111)
Creatinine, Ser: 1.31 mg/dL — ABNORMAL HIGH (ref 0.44–1.00)
GFR, Estimated: 45 mL/min — ABNORMAL LOW (ref >60)
Glucose, Bld: 120 mg/dL — ABNORMAL HIGH (ref 70–99)
Potassium: 4.7 mmol/L (ref 3.5–5.1)
Sodium: 139 mmol/L (ref 135–145)

## 2023-06-14 LAB — GLUCOSE, CAPILLARY
Glucose-Capillary: 112 mg/dL — ABNORMAL HIGH (ref 70–99)
Glucose-Capillary: 117 mg/dL — ABNORMAL HIGH (ref 70–99)
Glucose-Capillary: 120 mg/dL — ABNORMAL HIGH (ref 70–99)
Glucose-Capillary: 121 mg/dL — ABNORMAL HIGH (ref 70–99)
Glucose-Capillary: 122 mg/dL — ABNORMAL HIGH (ref 70–99)
Glucose-Capillary: 129 mg/dL — ABNORMAL HIGH (ref 70–99)
Glucose-Capillary: 136 mg/dL — ABNORMAL HIGH (ref 70–99)
Glucose-Capillary: 144 mg/dL — ABNORMAL HIGH (ref 70–99)
Glucose-Capillary: 147 mg/dL — ABNORMAL HIGH (ref 70–99)
Glucose-Capillary: 149 mg/dL — ABNORMAL HIGH (ref 70–99)
Glucose-Capillary: 153 mg/dL — ABNORMAL HIGH (ref 70–99)

## 2023-06-14 LAB — ECHO INTRAOPERATIVE TEE
AV Vena cont: 0.56 cm
Height: 57 in
Weight: 3168 [oz_av]

## 2023-06-14 LAB — APTT: aPTT: 37 s — ABNORMAL HIGH (ref 24–36)

## 2023-06-14 LAB — HEMOGLOBIN AND HEMATOCRIT, BLOOD
HCT: 21.2 % — ABNORMAL LOW (ref 36.0–46.0)
Hemoglobin: 6.5 g/dL — CL (ref 12.0–15.0)

## 2023-06-14 LAB — MAGNESIUM: Magnesium: 3.8 mg/dL — ABNORMAL HIGH (ref 1.7–2.4)

## 2023-06-14 LAB — PREPARE RBC (CROSSMATCH)

## 2023-06-14 LAB — PROTIME-INR
INR: 1.4 — ABNORMAL HIGH (ref 0.8–1.2)
Prothrombin Time: 17.3 s — ABNORMAL HIGH (ref 11.4–15.2)

## 2023-06-14 LAB — PLATELET COUNT: Platelets: 188 10*3/uL (ref 150–400)

## 2023-06-14 SURGERY — ASCENDING AORTIC ROOT REPLACEMENT
Anesthesia: General | Site: Chest

## 2023-06-14 MED ORDER — ALBUMIN HUMAN 5 % IV SOLN: INTRAVENOUS | Status: DC | PRN

## 2023-06-14 MED ORDER — MIDAZOLAM HCL (PF) 10 MG/2ML IJ SOLN
INTRAMUSCULAR | Status: AC
  Filled 2023-06-14: qty 2

## 2023-06-14 MED ORDER — LACTATED RINGERS IV SOLN: INTRAVENOUS | Status: DC | PRN

## 2023-06-14 MED ORDER — ONDANSETRON HCL 4 MG/2ML IJ SOLN
INTRAMUSCULAR | Status: DC | PRN
  Administered 2023-06-14: 4 mg via INTRAVENOUS

## 2023-06-14 MED ORDER — ONDANSETRON HCL 4 MG/2ML IJ SOLN
INTRAMUSCULAR | Status: AC
  Filled 2023-06-14: qty 2

## 2023-06-14 MED ORDER — CEFAZOLIN SODIUM-DEXTROSE 2-4 GM/100ML-% IV SOLN
2.0000 g | Freq: Three times a day (TID) | INTRAVENOUS | Status: AC
  Administered 2023-06-14 – 2023-06-16 (×6): 2 g via INTRAVENOUS
  Filled 2023-06-14 (×6): qty 100

## 2023-06-14 MED ORDER — POTASSIUM CHLORIDE 10 MEQ/50ML IV SOLN
10.0000 meq | INTRAVENOUS | Status: AC
Start: 2023-06-14 — End: 2023-06-14

## 2023-06-14 MED ORDER — SODIUM CHLORIDE 0.45 % IV SOLN
INTRAVENOUS | Status: AC | PRN
Start: 2023-06-14 — End: 2023-06-15

## 2023-06-14 MED ORDER — ACETAMINOPHEN 500 MG PO TABS
1000.0000 mg | ORAL_TABLET | Freq: Four times a day (QID) | ORAL | Status: AC
  Administered 2023-06-15 – 2023-06-19 (×18): 1000 mg via ORAL
  Filled 2023-06-14 (×19): qty 2

## 2023-06-14 MED ORDER — MIDAZOLAM HCL 2 MG/2ML IJ SOLN
2.0000 mg | INTRAMUSCULAR | Status: DC | PRN
Start: 2023-06-14 — End: 2023-06-17

## 2023-06-14 MED ORDER — METOPROLOL TARTRATE 5 MG/5ML IV SOLN: 2.5000 mg | INTRAVENOUS | Status: DC | PRN

## 2023-06-14 MED ORDER — ROCURONIUM BROMIDE 10 MG/ML (PF) SYRINGE
PREFILLED_SYRINGE | INTRAVENOUS | Status: DC | PRN
  Administered 2023-06-14 (×2): 30 mg via INTRAVENOUS
  Administered 2023-06-14: 100 mg via INTRAVENOUS

## 2023-06-14 MED ORDER — SODIUM CHLORIDE 0.9% FLUSH
10.0000 mL | Freq: Two times a day (BID) | INTRAVENOUS | Status: DC
  Administered 2023-06-14: 10 mL
  Administered 2023-06-14: 20 mL
  Administered 2023-06-15 – 2023-06-17 (×5): 10 mL
  Administered 2023-06-17: 40 mL
  Administered 2023-06-18 – 2023-06-21 (×6): 10 mL

## 2023-06-14 MED ORDER — SODIUM CHLORIDE 0.9 % IV SOLN: INTRAVENOUS | Status: AC

## 2023-06-14 MED ORDER — ALBUMIN HUMAN 5 % IV SOLN
250.0000 mL | INTRAVENOUS | Status: DC | PRN
  Administered 2023-06-14: 12.5 g via INTRAVENOUS

## 2023-06-14 MED ORDER — LIDOCAINE 2% (20 MG/ML) 5 ML SYRINGE
INTRAMUSCULAR | Status: DC | PRN
Start: 2023-06-14 — End: 2023-06-14
  Administered 2023-06-14: 100 mg via INTRAVENOUS

## 2023-06-14 MED ORDER — METOCLOPRAMIDE HCL 5 MG/ML IJ SOLN
10.0000 mg | Freq: Four times a day (QID) | INTRAMUSCULAR | Status: AC
  Administered 2023-06-14 – 2023-06-15 (×6): 10 mg via INTRAVENOUS
  Filled 2023-06-14 (×6): qty 2

## 2023-06-14 MED ORDER — ONDANSETRON HCL 4 MG/2ML IJ SOLN
4.0000 mg | Freq: Four times a day (QID) | INTRAMUSCULAR | Status: DC | PRN
  Administered 2023-06-14 – 2023-06-21 (×7): 4 mg via INTRAVENOUS
  Filled 2023-06-14 (×7): qty 2

## 2023-06-14 MED ORDER — PROTAMINE SULFATE 10 MG/ML IV SOLN
INTRAVENOUS | Status: DC | PRN
  Administered 2023-06-14: 270 mg via INTRAVENOUS

## 2023-06-14 MED ORDER — TRAMADOL HCL 50 MG PO TABS
50.0000 mg | ORAL_TABLET | ORAL | Status: DC | PRN
  Administered 2023-06-15: 50 mg via ORAL
  Administered 2023-06-16 – 2023-06-18 (×4): 100 mg via ORAL
  Administered 2023-06-18: 50 mg via ORAL
  Administered 2023-06-20 – 2023-06-22 (×7): 100 mg via ORAL
  Administered 2023-06-23 – 2023-06-24 (×2): 50 mg via ORAL
  Filled 2023-06-14 (×9): qty 2
  Filled 2023-06-14 (×2): qty 1
  Filled 2023-06-14 (×2): qty 2
  Filled 2023-06-14 (×2): qty 1
  Filled 2023-06-14: qty 2

## 2023-06-14 MED ORDER — SODIUM CHLORIDE 0.9 % IV SOLN: 250.0000 mL | INTRAVENOUS | Status: AC

## 2023-06-14 MED ORDER — NICARDIPINE HCL IN NACL 20-0.86 MG/200ML-% IV SOLN: 0.0000 mg/h | INTRAVENOUS | Status: DC

## 2023-06-14 MED ORDER — FENTANYL CITRATE (PF) 250 MCG/5ML IJ SOLN
INTRAMUSCULAR | Status: AC
  Filled 2023-06-14: qty 5

## 2023-06-14 MED ORDER — MIDAZOLAM HCL (PF) 5 MG/ML IJ SOLN
INTRAMUSCULAR | Status: DC | PRN
  Administered 2023-06-14 (×2): 1 mg via INTRAVENOUS

## 2023-06-14 MED ORDER — ROCURONIUM BROMIDE 10 MG/ML (PF) SYRINGE
PREFILLED_SYRINGE | INTRAVENOUS | Status: AC
  Filled 2023-06-14: qty 10

## 2023-06-14 MED ORDER — ACETAMINOPHEN 160 MG/5ML PO SOLN
1000.0000 mg | Freq: Four times a day (QID) | ORAL | Status: AC
  Administered 2023-06-16: 1000 mg

## 2023-06-14 MED ORDER — INSULIN ASPART 100 UNIT/ML IJ SOLN
0.0000 [IU] | INTRAMUSCULAR | Status: DC | PRN
Start: 2023-06-14 — End: 2023-06-14

## 2023-06-14 MED ORDER — SODIUM CHLORIDE (PF) 0.9 % IJ SOLN
INTRAMUSCULAR | Status: AC
  Filled 2023-06-14: qty 10

## 2023-06-14 MED ORDER — CHLORHEXIDINE GLUCONATE 0.12 % MT SOLN
OROMUCOSAL | Status: AC
  Administered 2023-06-14: 15 mL via OROMUCOSAL
  Filled 2023-06-14: qty 15

## 2023-06-14 MED ORDER — PHENYLEPHRINE HCL-NACL 20-0.9 MG/250ML-% IV SOLN
0.0000 ug/min | INTRAVENOUS | Status: DC
  Administered 2023-06-14: 50 ug/min via INTRAVENOUS

## 2023-06-14 MED ORDER — METOPROLOL TARTRATE 25 MG/10 ML ORAL SUSPENSION: 12.5000 mg | Freq: Two times a day (BID) | ORAL | Status: DC

## 2023-06-14 MED ORDER — PROTAMINE SULFATE 10 MG/ML IV SOLN
INTRAVENOUS | Status: AC
  Filled 2023-06-14: qty 25

## 2023-06-14 MED ORDER — SODIUM CHLORIDE 0.9% FLUSH: 3.0000 mL | INTRAVENOUS | Status: DC | PRN

## 2023-06-14 MED ORDER — PROPOFOL 500 MG/50ML IV EMUL
INTRAVENOUS | Status: DC | PRN
  Administered 2023-06-14: 20 ug/kg/min via INTRAVENOUS

## 2023-06-14 MED ORDER — INSULIN REGULAR(HUMAN) IN NACL 100-0.9 UT/100ML-% IV SOLN: INTRAVENOUS | Status: DC

## 2023-06-14 MED ORDER — PROPOFOL 10 MG/ML IV BOLUS
INTRAVENOUS | Status: AC
  Filled 2023-06-14: qty 20

## 2023-06-14 MED ORDER — PHENYLEPHRINE 80 MCG/ML (10ML) SYRINGE FOR IV PUSH (FOR BLOOD PRESSURE SUPPORT)
PREFILLED_SYRINGE | INTRAVENOUS | Status: DC | PRN
  Administered 2023-06-14: 120 ug via INTRAVENOUS
  Administered 2023-06-14 (×3): 160 ug via INTRAVENOUS
  Administered 2023-06-14: 80 ug via INTRAVENOUS
  Administered 2023-06-14: 120 ug via INTRAVENOUS
  Administered 2023-06-14: 160 ug via INTRAVENOUS
  Administered 2023-06-14 (×2): 80 ug via INTRAVENOUS
  Administered 2023-06-14 (×2): 160 ug via INTRAVENOUS
  Administered 2023-06-14 (×4): 80 ug via INTRAVENOUS
  Administered 2023-06-14 (×2): 160 ug via INTRAVENOUS

## 2023-06-14 MED ORDER — SODIUM CHLORIDE 0.9 % IV SOLN: INTRAVENOUS | Status: DC | PRN

## 2023-06-14 MED ORDER — HEPARIN SODIUM (PORCINE) 1000 UNIT/ML IJ SOLN
INTRAMUSCULAR | Status: AC
  Filled 2023-06-14: qty 1

## 2023-06-14 MED ORDER — CHLORHEXIDINE GLUCONATE 0.12 % MT SOLN: 15.0000 mL | Freq: Once | OROMUCOSAL | Status: AC

## 2023-06-14 MED ORDER — SODIUM CHLORIDE 0.9% FLUSH
10.0000 mL | INTRAVENOUS | Status: DC | PRN
  Administered 2023-06-21: 10 mL

## 2023-06-14 MED ORDER — LACTATED RINGERS IV SOLN
INTRAVENOUS | Status: AC
Start: 2023-06-14 — End: 2023-06-15

## 2023-06-14 MED ORDER — METOPROLOL TARTRATE 12.5 MG HALF TABLET: 12.5000 mg | ORAL_TABLET | Freq: Once | ORAL | Status: DC

## 2023-06-14 MED ORDER — METOPROLOL TARTRATE 12.5 MG HALF TABLET
12.5000 mg | ORAL_TABLET | Freq: Two times a day (BID) | ORAL | Status: DC
  Filled 2023-06-14: qty 1

## 2023-06-14 MED ORDER — SODIUM CHLORIDE 0.9% FLUSH
3.0000 mL | Freq: Two times a day (BID) | INTRAVENOUS | Status: DC
  Administered 2023-06-15 – 2023-06-24 (×15): 3 mL via INTRAVENOUS

## 2023-06-14 MED ORDER — PHENYLEPHRINE 80 MCG/ML (10ML) SYRINGE FOR IV PUSH (FOR BLOOD PRESSURE SUPPORT)
PREFILLED_SYRINGE | INTRAVENOUS | Status: AC
  Filled 2023-06-14: qty 20

## 2023-06-14 MED ORDER — PANTOPRAZOLE SODIUM 40 MG IV SOLR
40.0000 mg | Freq: Every day | INTRAVENOUS | Status: AC
  Administered 2023-06-14 – 2023-06-15 (×2): 40 mg via INTRAVENOUS
  Filled 2023-06-14 (×2): qty 10

## 2023-06-14 MED ORDER — PANTOPRAZOLE SODIUM 40 MG PO TBEC
40.0000 mg | DELAYED_RELEASE_TABLET | Freq: Every day | ORAL | Status: DC
  Administered 2023-06-16: 40 mg via ORAL
  Filled 2023-06-14: qty 1

## 2023-06-14 MED ORDER — FENTANYL CITRATE (PF) 250 MCG/5ML IJ SOLN
INTRAMUSCULAR | Status: DC | PRN
  Administered 2023-06-14: 50 ug via INTRAVENOUS
  Administered 2023-06-14: 200 ug via INTRAVENOUS
  Administered 2023-06-14: 100 ug via INTRAVENOUS

## 2023-06-14 MED ORDER — PROTAMINE SULFATE 10 MG/ML IV SOLN
INTRAVENOUS | Status: AC
  Filled 2023-06-14: qty 5

## 2023-06-14 MED ORDER — BISACODYL 5 MG PO TBEC
10.0000 mg | DELAYED_RELEASE_TABLET | Freq: Every day | ORAL | Status: DC
  Administered 2023-06-15 – 2023-06-21 (×4): 10 mg via ORAL
  Filled 2023-06-14 (×7): qty 2

## 2023-06-14 MED ORDER — VANCOMYCIN HCL 1000 MG IV SOLR: INTRAVENOUS | Status: DC | PRN

## 2023-06-14 MED ORDER — CHLORHEXIDINE GLUCONATE 0.12 % MT SOLN
15.0000 mL | OROMUCOSAL | Status: AC
  Administered 2023-06-14: 15 mL via OROMUCOSAL
  Filled 2023-06-14: qty 15

## 2023-06-14 MED ORDER — ORAL CARE MOUTH RINSE: 15.0000 mL | OROMUCOSAL | Status: DC | PRN

## 2023-06-14 MED ORDER — LIDOCAINE 2% (20 MG/ML) 5 ML SYRINGE
INTRAMUSCULAR | Status: AC
  Filled 2023-06-14: qty 5

## 2023-06-14 MED ORDER — OXYCODONE HCL 5 MG PO TABS
5.0000 mg | ORAL_TABLET | ORAL | Status: DC | PRN
  Administered 2023-06-15 – 2023-06-19 (×8): 10 mg via ORAL
  Filled 2023-06-14 (×8): qty 2

## 2023-06-14 MED ORDER — DEXMEDETOMIDINE HCL IN NACL 400 MCG/100ML IV SOLN: 0.0000 ug/kg/h | INTRAVENOUS | Status: DC

## 2023-06-14 MED ORDER — DEXTROSE 50 % IV SOLN: 0.0000 mL | INTRAVENOUS | Status: DC | PRN

## 2023-06-14 MED ORDER — HEPARIN SODIUM (PORCINE) 1000 UNIT/ML IJ SOLN
INTRAMUSCULAR | Status: DC | PRN
  Administered 2023-06-14: 29000 [IU] via INTRAVENOUS

## 2023-06-14 MED ORDER — ASPIRIN 81 MG PO CHEW
324.0000 mg | CHEWABLE_TABLET | Freq: Once | ORAL | Status: AC
  Administered 2023-06-14: 324 mg via ORAL
  Filled 2023-06-14: qty 4

## 2023-06-14 MED ORDER — MAGNESIUM SULFATE 4 GM/100ML IV SOLN
4.0000 g | Freq: Once | INTRAVENOUS | Status: AC
  Administered 2023-06-14: 4 g via INTRAVENOUS
  Filled 2023-06-14: qty 100

## 2023-06-14 MED ORDER — PHENYLEPHRINE 80 MCG/ML (10ML) SYRINGE FOR IV PUSH (FOR BLOOD PRESSURE SUPPORT)
PREFILLED_SYRINGE | INTRAVENOUS | Status: AC
Start: 2023-06-14 — End: ?
  Filled 2023-06-14: qty 10

## 2023-06-14 MED ORDER — METHADONE HCL IV SYRINGE 10 MG/ML FOR CABG
0.3000 mg/kg | Freq: Once | INTRAMUSCULAR | Status: AC
  Administered 2023-06-14: 15 mg via INTRAVENOUS
  Filled 2023-06-14: qty 1.5

## 2023-06-14 MED ORDER — DOCUSATE SODIUM 100 MG PO CAPS
200.0000 mg | ORAL_CAPSULE | Freq: Every day | ORAL | Status: DC
  Administered 2023-06-15 – 2023-06-24 (×6): 200 mg via ORAL
  Filled 2023-06-14 (×8): qty 2

## 2023-06-14 MED ORDER — EPINEPHRINE HCL 5 MG/250ML IV SOLN IN NS: 0.5000 ug/min | INTRAVENOUS | Status: DC

## 2023-06-14 MED ORDER — SODIUM CHLORIDE 0.9 % IV SOLN: 10.0000 mL/h | Freq: Once | INTRAVENOUS | Status: DC

## 2023-06-14 MED ORDER — PROPOFOL 1000 MG/100ML IV EMUL
INTRAVENOUS | Status: AC
  Filled 2023-06-14: qty 100

## 2023-06-14 MED ORDER — PROPOFOL 10 MG/ML IV BOLUS
INTRAVENOUS | Status: DC | PRN
  Administered 2023-06-14: 60 mg via INTRAVENOUS

## 2023-06-14 MED ORDER — CHLORHEXIDINE GLUCONATE 4 % EX SOLN: 30.0000 mL | CUTANEOUS | Status: DC

## 2023-06-14 MED ORDER — PLASMA-LYTE A IV SOLN: INTRAVENOUS | Status: DC | PRN

## 2023-06-14 MED ORDER — CHLORHEXIDINE GLUCONATE CLOTH 2 % EX PADS
6.0000 | MEDICATED_PAD | Freq: Every day | CUTANEOUS | Status: DC
  Administered 2023-06-14 – 2023-06-24 (×11): 6 via TOPICAL

## 2023-06-14 MED ORDER — MORPHINE SULFATE (PF) 2 MG/ML IV SOLN
1.0000 mg | INTRAVENOUS | Status: DC | PRN
  Administered 2023-06-14: 2 mg via INTRAVENOUS
  Administered 2023-06-14: 1 mg via INTRAVENOUS
  Administered 2023-06-15: 2 mg via INTRAVENOUS
  Filled 2023-06-14 (×3): qty 1

## 2023-06-14 MED ORDER — ~~LOC~~ CARDIAC SURGERY, PATIENT & FAMILY EDUCATION
Freq: Once | Status: DC
  Filled 2023-06-14: qty 1

## 2023-06-14 MED ORDER — 0.9 % SODIUM CHLORIDE (POUR BTL) OPTIME
TOPICAL | Status: DC | PRN
  Administered 2023-06-14: 5000 mL

## 2023-06-14 MED ORDER — BISACODYL 10 MG RE SUPP: 10.0000 mg | Freq: Every day | RECTAL | Status: DC

## 2023-06-14 MED ORDER — ASPIRIN 81 MG PO CHEW: 324.0000 mg | CHEWABLE_TABLET | Freq: Every day | ORAL | Status: DC

## 2023-06-14 MED ORDER — VANCOMYCIN HCL IN DEXTROSE 1-5 GM/200ML-% IV SOLN
1000.0000 mg | Freq: Once | INTRAVENOUS | Status: AC
  Administered 2023-06-14: 1000 mg via INTRAVENOUS
  Filled 2023-06-14: qty 200

## 2023-06-14 MED ORDER — ACETAMINOPHEN 160 MG/5ML PO SOLN
650.0000 mg | Freq: Once | ORAL | Status: AC
  Administered 2023-06-14: 650 mg
  Filled 2023-06-14: qty 20.3

## 2023-06-14 MED ORDER — ASPIRIN 325 MG PO TBEC
325.0000 mg | DELAYED_RELEASE_TABLET | Freq: Every day | ORAL | Status: DC
Start: 2023-06-15 — End: 2023-06-22
  Administered 2023-06-15 – 2023-06-21 (×7): 325 mg via ORAL
  Filled 2023-06-14 (×7): qty 1

## 2023-06-14 MED ORDER — SODIUM CHLORIDE 0.9 % IV SOLN
0.3000 ug/kg | Freq: Once | INTRAVENOUS | Status: AC
  Administered 2023-06-14: 26.939999999999998 ug via INTRAVENOUS
  Filled 2023-06-14: qty 6.7

## 2023-06-14 MED ORDER — ORAL CARE MOUTH RINSE
15.0000 mL | OROMUCOSAL | Status: DC
  Administered 2023-06-14 – 2023-06-15 (×5): 15 mL via OROMUCOSAL

## 2023-06-14 SURGICAL SUPPLY — 88 items
ADAPTER CARDIO PERF ANTE/RETRO (ADAPTER) ×2 IMPLANT
BAG DECANTER FOR FLEXI CONT (MISCELLANEOUS) ×2 IMPLANT
BLADE CLIPPER SURG (BLADE) ×2 IMPLANT
BLADE STERNUM SYSTEM 6 (BLADE) ×2 IMPLANT
BLADE SURG 11 STRL SS (BLADE) IMPLANT
BLADE SURG 15 STRL LF DISP TIS (BLADE) IMPLANT
BNDG ELASTIC 4X5.8 VLCR STR LF (GAUZE/BANDAGES/DRESSINGS) ×2 IMPLANT
BNDG ELASTIC 6X5.8 VLCR STR LF (GAUZE/BANDAGES/DRESSINGS) ×2 IMPLANT
BNDG GAUZE DERMACEA FLUFF 4 (GAUZE/BANDAGES/DRESSINGS) ×2 IMPLANT
CANISTER SUCT 3000ML PPV (MISCELLANEOUS) ×2 IMPLANT
CANNULA MC2 2 STG 36/46 CONN (CANNULA) IMPLANT
CANNULA NON VENT 20FR 12 (CANNULA) ×2 IMPLANT
CANNULA NON VENT 22FR 12 (CANNULA) ×2 IMPLANT
CATH HEART VENT LEFT (CATHETERS) ×2 IMPLANT
CATH RETROPLEGIA CORONARY 14FR (CATHETERS) ×2 IMPLANT
CATH ROBINSON RED A/P 18FR (CATHETERS) ×6 IMPLANT
CATH THOR STR 32F SOFT 20 RADI (CATHETERS) ×4 IMPLANT
CATH THORACIC 28FR RT ANG (CATHETERS) ×2 IMPLANT
CAUTERY HI TEMP FINE TIP 2 (MISCELLANEOUS) IMPLANT
CLIP TI LARGE 6 (CLIP) IMPLANT
CLIP TI MEDIUM 24 (CLIP) IMPLANT
CLIP TI WIDE RED SMALL 24 (CLIP) IMPLANT
DEV SUMP PERICARDIAL 20F 15IN (MISCELLANEOUS) IMPLANT
DEVICE SUT CK QUICK LOAD INDV (Prosthesis & Implant Heart) IMPLANT
DEVICE SUT CK QUICK LOAD MINI (Prosthesis & Implant Heart) IMPLANT
DRAPE CV SPLIT W-CLR ANES SCRN (DRAPES) ×2 IMPLANT
DRAPE INCISE IOBAN 66X45 STRL (DRAPES) ×2 IMPLANT
DRAPE PERI GROIN 82X75IN TIB (DRAPES) ×2 IMPLANT
DRAPE WARM FLUID 44X44 (DRAPES) ×2 IMPLANT
DRSG AQUACEL AG ADV 3.5X10 (GAUZE/BANDAGES/DRESSINGS) ×2 IMPLANT
ELECT CAUTERY BLADE 6.4 (BLADE) ×2 IMPLANT
ELECT REM PT RETURN 9FT ADLT (ELECTROSURGICAL) ×4 IMPLANT
ELECTRODE REM PT RTRN 9FT ADLT (ELECTROSURGICAL) ×4 IMPLANT
FELT TEFLON 1X6 (MISCELLANEOUS) ×4 IMPLANT
GAUZE SPONGE 4X4 12PLY STRL (GAUZE/BANDAGES/DRESSINGS) ×4 IMPLANT
GAUZE SPONGE 4X4 12PLY STRL LF (GAUZE/BANDAGES/DRESSINGS) IMPLANT
GLOVE BIO SURGEON STRL SZ 6.5 (GLOVE) IMPLANT
GLOVE ECLIPSE 7.5 STRL STRAW (GLOVE) IMPLANT
GOWN STRL REUS W/ TWL LRG LVL3 (GOWN DISPOSABLE) ×12 IMPLANT
HEMOSTAT SURGICEL 2X14 (HEMOSTASIS) IMPLANT
INSERT FOGARTY XLG (MISCELLANEOUS) ×2 IMPLANT
KIT BASIN OR (CUSTOM PROCEDURE TRAY) ×2 IMPLANT
KIT CATH CPB BARTLE (MISCELLANEOUS) ×2 IMPLANT
KIT SUCTION CATH 14FR (SUCTIONS) ×2 IMPLANT
KIT SUT CK MINI COMBO 4X17 (Prosthesis & Implant Heart) IMPLANT
KIT TURNOVER KIT B (KITS) ×2 IMPLANT
LINE VENT (MISCELLANEOUS) IMPLANT
NS IRRIG 1000ML POUR BTL (IV SOLUTION) ×10 IMPLANT
ORGANIZER SUTURE GABBAY-FRATER (MISCELLANEOUS) ×2 IMPLANT
PACK E OPEN HEART (SUTURE) ×2 IMPLANT
PACK OPEN HEART (CUSTOM PROCEDURE TRAY) ×2 IMPLANT
PAD ARMBOARD 7.5X6 YLW CONV (MISCELLANEOUS) ×4 IMPLANT
PAD ELECT DEFIB RADIOL ZOLL (MISCELLANEOUS) ×2 IMPLANT
PENCIL BUTTON HOLSTER BLD 10FT (ELECTRODE) ×2 IMPLANT
POSITIONER HEAD DONUT 9IN (MISCELLANEOUS) ×2 IMPLANT
PUNCH AORTIC ROTATE 4.0MM (MISCELLANEOUS) ×2 IMPLANT
PUNCH AORTIC ROTATE 4.5MM 8IN (MISCELLANEOUS) ×2 IMPLANT
SEALANT SURG COSEAL 8ML (VASCULAR PRODUCTS) IMPLANT
SET MPS 3-ND DEL (MISCELLANEOUS) IMPLANT
STOPCOCK 4 WAY LG BORE MALE ST (IV SETS) IMPLANT
SUPPORT HEART JANKE-BARRON (MISCELLANEOUS) ×2 IMPLANT
SUT BONE WAX W31G (SUTURE) ×2 IMPLANT
SUT EB EXC GRN/WHT 2-0 V-5 (SUTURE) ×4 IMPLANT
SUT MNCRL AB 4-0 PS2 18 (SUTURE) ×4 IMPLANT
SUT PROLENE 4 0 SH DA (SUTURE) ×2 IMPLANT
SUT PROLENE 4-0 RB1 .5 CRCL 36 (SUTURE) IMPLANT
SUT PROLENE 5 0 C 1 36 (SUTURE) IMPLANT
SUT PROLENE 6 0 C 1 30 (SUTURE) IMPLANT
SUT PROLENE 7 0 BV1 MDA (SUTURE) ×2 IMPLANT
SUT SILK 2 0 SH (SUTURE) IMPLANT
SUT STEEL SZ 6 DBL 3X14 BALL (SUTURE) ×6 IMPLANT
SUT TEM PAC WIRE 2 0 SH (SUTURE) IMPLANT
SUT VIC AB 0 CTX36XBRD ANTBCTR (SUTURE) ×4 IMPLANT
SUT VIC AB 2-0 CT1 TAPERPNT 27 (SUTURE) ×4 IMPLANT
SYSTEM SAHARA CHEST DRAIN ATS (WOUND CARE) ×2 IMPLANT
TAPE CLOTH SURG 4X10 WHT LF (GAUZE/BANDAGES/DRESSINGS) IMPLANT
TAPE PAPER 2X10 WHT MICROPORE (GAUZE/BANDAGES/DRESSINGS) IMPLANT
TOWEL GREEN STERILE (TOWEL DISPOSABLE) ×2 IMPLANT
TOWEL GREEN STERILE FF (TOWEL DISPOSABLE) ×2 IMPLANT
TRAY CATH LUMEN 1 20CM STRL (SET/KITS/TRAYS/PACK) IMPLANT
TRAY FOLEY SLVR 16FR TEMP STAT (SET/KITS/TRAYS/PACK) ×2 IMPLANT
TUBING ART PRESS 48 MALE/FEM (TUBING) IMPLANT
TUBING LAP HI FLOW INSUFFLATIO (TUBING) ×2 IMPLANT
UNDERPAD 30X36 HEAVY ABSORB (UNDERPADS AND DIAPERS) ×2 IMPLANT
VALVE AORTIC KONECT RESILIA 21 (Valve) IMPLANT
VALVE AORTIC SZ 21 (Prosthesis & Implant Heart) IMPLANT
VENT LEFT HEART 12002 (CATHETERS) ×2 IMPLANT
WATER STERILE IRR 1000ML POUR (IV SOLUTION) ×4 IMPLANT

## 2023-06-14 NOTE — Procedures (Signed)
Extubation Procedure Note  Patient Details:   Name: [name] DOB: 07-Aug-1955 MRN: [id number]   Airway Documentation:    Vent end date: 06/14/23 Vent end time: 1828   Evaluation  O2 sats: stable throughout Complications: No apparent complications Patient did tolerate procedure well. Bilateral Breath Sounds: Clear, Diminished   Yes,  Per protocol order/CCM verbal order, RT extubated pt to BiPAP. Prior to extubation pt's NIF= -40 VC= 1.8L Pt tolerated well with SVS and no complications. Pt did have a positive cuff leak. No stridor noted. Pt was placed on BiPAP post extubation due to ABG.   Megan Mans 06/14/2023, 6:31 PM

## 2023-06-14 NOTE — Plan of Care (Signed)

## 2023-06-14 NOTE — Progress Notes (Signed)
Patient ID: [name], female   DOB: 1955/10/08, 68 y.o.   MRN: [id number]  TCTS Evening Rounds:   Hemodynamically stable on Epi 2. AV paced. CI = 1.8   Has started to wake up on vent.   Urine output good  CT output low  CBC    Component Value Date/Time   WBC 19.9 (H) 06/14/2023 1321   RBC 3.94 06/14/2023 1321   HGB 11.2 (L) 06/14/2023 1618   HGB 10.4 (L) 03/29/2023 0946   HCT 33.0 (L) 06/14/2023 1618   HCT 34.7 03/29/2023 0946   PLT 188 06/14/2023 1321   PLT 313 03/29/2023 0946   MCV 81.2 06/14/2023 1321   MCV 79 03/29/2023 0946   MCH 25.4 (L) 06/14/2023 1321   MCHC 31.3 06/14/2023 1321   RDW 16.9 (H) 06/14/2023 1321   RDW 16.6 (H) 03/29/2023 0946   LYMPHSABS 2.1 04/28/2023 0246   LYMPHSABS 2.1 10/06/2017 0927   MONOABS 0.6 04/28/2023 0246   EOSABS 0.3 04/28/2023 0246   EOSABS 0.1 10/06/2017 0927   BASOSABS 0.1 04/28/2023 0246   BASOSABS 0.0 10/06/2017 0927     BMET    Component Value Date/Time   NA 139 06/14/2023 1618   NA 142 03/29/2023 0946   K 4.9 06/14/2023 1618   CL 103 06/14/2023 1219   CO2 27 06/14/2023 0626   GLUCOSE 239 (H) 06/14/2023 1219   BUN 18 06/14/2023 1219   BUN 22 03/29/2023 0946   CREATININE 1.30 (H) 06/14/2023 1219   CREATININE 1.40 (H) 04/26/2023 1053   CALCIUM 9.5 06/14/2023 0626   EGFR 42 (L) 04/26/2023 1053   EGFR 59 (L) 03/29/2023 0946   GFRNONAA 44 (L) 06/14/2023 0626   GFRNONAA 83 11/12/2015 1436     A/P:  Stable postop course. Continue current plans. Wean vent when more awake.

## 2023-06-14 NOTE — Interval H&P Note (Signed)
History and Physical Interval Note:  06/14/2023 6:25 AM  [name]  has presented today for surgery, with the diagnosis of SEVERE AS.  The various methods of treatment have been discussed with the patient and family. After consideration of risks, benefits and other options for treatment, the patient has consented to  Procedure(s): AORTIC VALVE REPLACEMENT (AVR) (N/A) possible ASCENDING AORTIC ROOT REPLACEMENT (N/A) TRANSESOPHAGEAL ECHOCARDIOGRAM (TEE) (N/A) as a surgical intervention.  The patient's history has been reviewed, patient examined, no change in status, stable for surgery.  I have reviewed the patient's chart and labs.  Questions were answered to the patient's satisfaction.     Eugenio Hoes

## 2023-06-14 NOTE — Op Note (Signed)
CARDIOVASCULAR SURGERY OPERATIVE NOTE  06/14/2023 [name] [id number]  Surgeon:  Ashley Akin, MD  First Assistant: Gershon Crane [facility]                               An experienced assistant was required given the complexity of this surgery and the standard of surgical care. The assistant was needed for exposure, dissection, suctioning, retraction of delicate tissues and sutures, instrument exchange and for overall help during this procedure.     Preoperative Diagnosis:  Aortic Stenosis  Postoperative Diagnosis:  Same   Procedure: Aortic Root replacement with a 21 mm Connect Pericardial Aortic valve conduit  Anesthesia:  General Endotracheal   Clinical History/Surgical Indication: Pt is a 68 yo female with NYHA class 3 symptoms of severe AS with normal LV function and no CAD> She unfortunately has a very small annulus which makes TAVR higher risk for coronary obstruction. Her body habitus and immobility would make surgery higher risk which will need root replacement or annulus enlarging but overall I feel would be the best path. She understands and wishes to proceed after the new year. All the risks and goals and recovery were discussed.   Findings: There was normal ventricular function.  There was severe aortic stenosis and the aortic root was completely calcified.  The annulus was smaller than the 21 mm sizer which required aortic root replacement to obtain an adequate sized valve.  The coronary buttons were also calcified which required very small buttons to be transferred.  Conclusion procedure was normal ventricular function with a well-seated valve prosthesis with a mean gradient of 11 mmHg moderate mitral regurgitation.  Patient was AV sequentially paced.  Preparation:  The patient was seen in the preoperative holding area and the correct patient, correct operation were confirmed with the patient after reviewing the medical record and catheterization. The consent was signed  by me. Preoperative antibiotics were given. A pulmonary arterial line and radial arterial line were placed by the anesthesia team. The patient was taken back to the operating room and positioned supine on the operating room table. After being placed under general endotracheal anesthesia by the anesthesia team a foley catheter was placed. The neck, chest, abdomen, and both legs were prepped with betadine soap and solution and draped in the usual sterile manner. A surgical time-out was taken and the correct patient and operative procedure were confirmed with the nursing and anesthesia staff.  Operation: Median sternotomy incision was then created and sternal valve sternal saw.  Simultaneously heparin was delivered and the pericardial well developed.  Aorta was cannulated with a 22 Starns aortic cannula and a two-stage cannulas placed in the right atrium for venous return.  Antegrade and and retrograde cardioplegia catheters were placed in the ascending aorta and coronary sinus respectively with adequate confirmation of anticoagulation cardiopulmonary bypass was instituted.  The left ventricular sump was then placed across right superior pulmonary vein into the ventricle.  With adequate confirmation of adequate bypass aortic cross-clamp was placed and cold blood [place] cardioplegia was delivered for 1300 cc.  An additional 400 cc was delivered at approximately 1 hour after cross-clamp and then additional reanimation dose was delivered just prior to cross-clamp removal.  The aorta was opened and the aortic valve was identified photographed and extensive calcification of the aortic root was noted.  The aortic valve was resected and the annulus decalcified.  The 21 mm sizer would not advance  across the annulus therefore it was felt that the only feasible way of obtaining an adequate gradient was to perform an aortic root replacement.  This was difficult however since the entire root including the coronary ostia were  calcified.  Meticulously the coronary buttons were created and the rest of the aorta was resected.  Utilizing 2-0 Tevdek pledgeted sutures with the pledgets on the exterior aorta these were placed circle annularly.  A 21 mm freestyle valve was then secured proximally with the cor knot system however it was quite evident that the D formation of the prosthesis would not allow for competent valve.  This was removed and an additional series of 2-0 Tevdek pledgeted sutures were placed circle annularly again with the pledgets exterior a 21 mm connect pericardial tissue valve conduit was then secured with the cor knot system.  The coronary buttons were meticulously mobilized and sewn onto the conduit graft with 5-0 Prolene sutures.  Distal anastomosis was made with a running 4-0 Prolene suture.  With the patient in headdown position aortic cross-clamp was removed multiple de-airing maneuvers performed ventricular and atrial pacing wires were placed about the inferior stab wounds and secured.  With adequate hemodynamics the patient was weaned from cardiopulmonary bypass on Neo-Synephrine support.  With adequate hemodynamics protamine was delivered and the patient decannulated sites oversewn necessary.  With adequate hemostasis the sternum was reapproximated with interrupted stainless steel wire and the presternal subcutaneous tissue and skin were closed multilayer's absorbable suture.  Sterile dressings were applied.

## 2023-06-14 NOTE — Hospital Course (Addendum)
  Wendall Stade, MD [name], MD  History of Present Illness: At time of CT surgical evaluation Pt is a 68 yo female who has been suffering from DOE and has been found to have severe AS with a mean gradient of and EF of 70%. Pt recently admitted with CHF at [facility] and was discharged. She has had a work up with echo and cath (no CAD) and on CTA work up was found to have very small annulus which is prohibitive for a sapien valve and for a medtronic would risk coronary obstruction. She is here to discuss options. She has had back surgeries which needed hardware removal and now walks with a cane at home  Following full review of the patient and all relevant studies Dr. Leafy Ro recommended proceeding with aortic valve replacement as her best surgical option.  Hospital course: Patient was admitted electively and on 06/14/2023 taken the operating room at which time she underwent aortic root replacement with connect Resilia aortic valve conduit.  She tolerated the procedure well was taken to the surgical intensive care unit in stable condition.  Postoperative hospital course:  Patient was extubated using standard post cardiac surgical protocols without difficulty.  Expected volume overload was treated with appropriate diuresis, not related to heart failure or Niccoli significant long-term.  Hemodynamics were stable on postop day 1 with all drips off.  A-line and Swan were removed.  She did have a complete heart block and EP was consulted to assist with this due to the potential need for permanent pacemaker if it did not resolve on its own.  This is felt not to be a surprising finding as there was significant calcification in the aortic root requiring debridement.  She has been observed for a few days and is maintaining a complete heart block. She did develop some low-grade fevers with cultures being positive for Enterococcus faecalis.  She was started on Augmentin for this.  Foley has been  removed.  She did have a bump in her creatinine but is trending over time lower.  Heart rhythm over time did improve to a sinus rhythm.  She did have episodes of atrial flutter that did require initiating amiodarone.  She has also been started on apixaban.  He was determined she would not require permanent pacing.  Home diabetic medications will be resumed at time of discharge.  Home health arrangements have been made as well as some equipment to assist with rehab.  Incisions are noted to be healing well without evidence of infection.  She is tolerating diet.  Oxygen has been weaned and she maintains good saturations on room air.  Renal function has been pretty stable over time.  Most recent creatinine is 1.06.  She does have an expected acute blood loss anemia and values are stable over time.  Most recent hemoglobin hematocrit dated 06/24/2023 was 9.4 and 30.4 respectively.  She does have a leukocytosis which is felt to be clinically related to review both steroid use and positive sputum cultures.  She will continue a course of Augmentin for a full 10 days.  She has had some elevated blood pressure readings and ARB has been reinitiated at home dose as well as Norvasc and low-dose beta-blocker.  Overall, at the time of discharge the patient is felt to be quite stable.

## 2023-06-14 NOTE — Brief Op Note (Signed)
06/14/2023  1:30 PM  PATIENT:  [name]  68 y.o. female  PRE-OPERATIVE DIAGNOSIS:  SEVERE AORTIC STENOSIS  POST-OPERATIVE DIAGNOSIS:  SEVERE AORTIC STENOSIS  PROCEDURE:  Procedure(s): ASCENDING AORTIC ROOT REPLACEMENT USING KONECT RESILIA AORTIC VALVE CONDUIT SIZE AND REATTACHMENT OF RIGHT AND LEFT CORONARY (N/A) TRANSESOPHAGEAL ECHOCARDIOGRAM (TEE) (N/A)  SURGEON:  Surgeons and Role:    * Eugenio Hoes, MD - Primary  PHYSICIAN ASSISTANT: Josie Burleigh PA-C  ASSISTANTS: KRISTINA PRICE RNFA   ANESTHESIA:   general  EBL:  476 mL   BLOOD ADMINISTERED: 630  CC PRBC  DRAINS:  2 MEDIASTINAL CHEST TUBES    LOCAL MEDICATIONS USED:  NONE  SPECIMEN:  No Specimen  DISPOSITION OF SPECIMEN:  N/A  COUNTS:  YES  TOURNIQUET:  * No tourniquets in log *  DICTATION: .Dragon Dictation  PLAN OF CARE: Admit to inpatient   PATIENT DISPOSITION:  ICU - intubated and hemodynamically stable.   Delay start of Pharmacological VTE agent (>24hrs) due to surgical blood loss or risk of bleeding: yes  COMPLICATIONS: NO KNOWN

## 2023-06-14 NOTE — Anesthesia Postprocedure Evaluation (Signed)
Anesthesia Post Note  Patient: [name]  Procedure(s) Performed: ASCENDING AORTIC ROOT REPLACEMENT USING KONECT RESILIA AORTIC VALVE CONDUIT SIZE AND REATTACHMENT OF RIGHT AND LEFT CORONARY (Chest) TRANSESOPHAGEAL ECHOCARDIOGRAM (TEE)     Patient location during evaluation: ICU Anesthesia Type: General Level of consciousness: sedated Pain management: pain level controlled Vital Signs Assessment: post-procedure vital signs reviewed and stable Respiratory status: respiratory function stable, patient on ventilator - see flowsheet for VS and patient remains intubated per anesthesia plan Cardiovascular status: stable Postop Assessment: no apparent nausea or vomiting Anesthetic complications: no   No notable events documented.  Last Vitals:  Vitals:   06/14/23 1345 06/14/23 1350  BP: 114/68   Pulse: 80 80  Resp: 20 20  Temp: (!) 35.8 C (!) 35.8 C  SpO2: 97% 96%    Last Pain:  Vitals:   06/14/23 0618  TempSrc:   PainSc: 3                  Mariann Barter

## 2023-06-14 NOTE — Anesthesia Procedure Notes (Signed)
Procedure Name: Intubation Date/Time: 06/14/2023 7:52 AM  Performed by: Yolonda Kida, CRNAPre-anesthesia Checklist: Patient identified, Emergency Drugs available, Suction available and Patient being monitored Patient Re-evaluated:Patient Re-evaluated prior to induction Oxygen Delivery Method: Circle System Utilized Preoxygenation: Pre-oxygenation with 100% oxygen Induction Type: IV induction Ventilation: Mask ventilation without difficulty, Oral airway inserted - appropriate to patient size and Two handed mask ventilation required Laryngoscope Size: Glidescope and 3 Grade View: Grade I Tube type: Oral Tube size: 8.0 mm Number of attempts: 1 Airway Equipment and Method: Stylet, Oral airway and Video-laryngoscopy Placement Confirmation: ETT inserted through vocal cords under direct vision, positive ETCO2 and breath sounds checked- equal and bilateral Secured at: 22 cm Tube secured with: Tape Dental Injury: Teeth and Oropharynx as per pre-operative assessment  Difficulty Due To: Difficult Airway- due to large tongue Comments: DL x1 with Glidescope 3 by SRNA; unable to pass scope past tongue. Easy mask ventilation with 90mm OAW in place. DL x1 by CRNA with Grade 1 view. No adverse events.

## 2023-06-14 NOTE — Consult Note (Addendum)
NAME:  [name], MRN:  [id number], DOB:  1955/07/04, LOS: 0 ADMISSION DATE:  06/14/2023, CONSULTATION DATE:  06/14/2023 REFERRING MD:  Tereso Newcomer, CHIEF COMPLAINT:  post-operative management   History of Present Illness:  68 year old female with past medical history of asthma, T2DM, HTN, HLD, severe aortic stenosis who presents for aortic valve replacement, possible aortic root replacement.  Patient had recent admission to [facility] 04/27/23-04/30/23 for progressive dyspnea requiring 4LNC. Appeared to be FVO. She was diuresed and eventually weaned off nitroglycerin gtt for concomitant hypertensive emergency. She had echo which showed EF 60-65%, no RWMA, G2DD, moderate AVR, severe AV stenosis. She had follow-up with outpatient cardiology and TCTS.   Intraoperatively, received AVR and root replacement. To ICU from OR on Epi, NE, dex, propofol, insulin. Getting albumin. X1 CT.   Pump time: 2h 16m Xclamp time: 2h 43m EBL: 476cc Cell saver: 230cc 2UPRBC  Pertinent  Medical History  T2DM, HTN, HFpEF, HLD, asthma, aortic stenosis   Significant Hospital Events: Including procedures, antibiotic start and stop dates in addition to other pertinent events   1/21: s/p AVR, aortic root replacement to ICU from OR post-operatively   Interim History / Subjective:  Patient unable to participate in the subjective portion of the exam. Intubated, sedated. X1 CT in place. Receiving albumin.   Objective   Blood pressure (!) 146/73, pulse 88, temperature 98.4 F (36.9 C), temperature source Oral, resp. rate 17, height 4\' 9"  (1.448 m), weight 89.8 kg, SpO2 95%.    Vent Mode: SIMV;PSV;PRVC FiO2 (%):  [50 %-60 %] 60 % Set Rate:  [16 bmp-22 bmp] 22 bmp Vt Set:  [400 mL] 400 mL PEEP:  [5 cmH20] 5 cmH20 Pressure Support:  [10 cmH20] 10 cmH20   Intake/Output Summary (Last 24 hours) at 06/14/2023 1331 Last data filed at 06/14/2023 1257 Gross per 24 hour  Intake 3310 ml  Output 1276 ml  Net 2034 ml    Filed Weights   06/13/23 1200 06/14/23 0552  Weight: 90.7 kg 89.8 kg    Examination: General: middle aged female, laying in bed, no acute distress  HENT: [place]/AT, anicteric sclera, PERRLA, ETT  Lungs: clear bilaterally, vented, 50/5.  Cardiovascular: paced, no underlying rhythm, no JVD or peripheral edema. Sternotomy dressing c/d/i Abdomen: rounded, soft, non-distended  Extremities: no pitting edema, warm  Neuro: sedated  GU: foley   Resolved Hospital Problem list     Assessment & Plan:  Severe aortic stenosis s/p AVR Severe aortic root calcification s/p aortic root replacement  Post bypass vasoplegia Acute postoperative vent management  Expected post-operative ABLA Expected post-operative consumptive thrombocytopenia  Severe AS with moderate AVR now s/p replacement. Intraoperatively found to have significant aortic root calcifications and proceeded with root replacement.  - post-op management per TCTS - rapid wean per TCTS protocol - VAP/ PPI - CXR/ ABG, CBC, BMET now - con't weaning pressors as able to maintain MAP >70-90. Albumin boluses prn for low SV.  - tele monitoring/ pacing prn - insulin gtt wean per protocol  - mediastinal drain per TCTS - multimodal pain control per protocol- oxycodone, tramadol, morphine with bowel regimen - ASA, statin to resume next day  - metoprolol BID once off pressors - complete post-op antibiotics - monitor electrolytes, replete PRN    Best Practice (right click and "Reselect all SmartList Selections" daily)   Diet/type: NPO DVT prophylaxis: SCD Pressure ulcer(s): none GI prophylaxis: PPI Lines: Central line, Arterial Line, and yes and it is still needed Foley:  Yes,  and it is still needed Code Status:  full code Last date of multidisciplinary goals of care discussion [per primary]  Labs   CBC: Recent Labs  Lab 06/14/23 0626 06/14/23 0804 06/14/23 1120 06/14/23 1124 06/14/23 1151 06/14/23 1214 06/14/23 1219  WBC 5.1   --   --   --   --   --   --   HGB 10.3*   < > 6.5* 8.2* 7.8* 7.1* 8.8*  HCT 33.8*   < > 21.2* 24.0* 23.0* 21.0* 26.0*  MCV 79.9*  --   --   --   --   --   --   PLT 270  --  188  --   --   --   --    < > = values in this interval not displayed.    Basic Metabolic Panel: Recent Labs  Lab 06/14/23 0626 06/14/23 0804 06/14/23 0841 06/14/23 0906 06/14/23 0940 06/14/23 1019 06/14/23 1049 06/14/23 1124 06/14/23 1151 06/14/23 1214 06/14/23 1219  NA 141   < > 140   < > 140   < > 137 137 136 137 137  K 3.8   < > 3.8   < > 4.9   < > 5.2* 4.8 5.7* 5.0 5.1  CL 106   < > 104  --  102  --  103  --  104  --  103  CO2 27  --   --   --   --   --   --   --   --   --   --   GLUCOSE 115*   < > 126*  --  125*  --  191*  --  187*  --  239*  BUN 16   < > 18  --  17  --  18  --  20  --  18  CREATININE 1.34*   < > 1.30*  --  1.20*  --  1.30*  --  1.30*  --  1.30*  CALCIUM 9.5  --   --   --   --   --   --   --   --   --   --    < > = values in this interval not displayed.   GFR: Estimated Creatinine Clearance: 39.7 mL/min (A) (by C-G formula based on SCr of 1.3 mg/dL (H)). Recent Labs  Lab 06/14/23 0626  WBC 5.1    Liver Function Tests: Recent Labs  Lab 06/14/23 0626  AST 22  ALT 21  ALKPHOS 69  BILITOT 0.4  PROT 7.0  ALBUMIN 3.8   No results for input(s): "LIPASE", "AMYLASE" in the last 168 hours. No results for input(s): "AMMONIA" in the last 168 hours.  ABG    Component Value Date/Time   PHART 7.295 (L) 06/14/2023 1214   PCO2ART 50.1 (H) 06/14/2023 1214   PO2ART 227 (H) 06/14/2023 1214   HCO3 24.4 06/14/2023 1214   TCO2 23 06/14/2023 1219   ACIDBASEDEF 2.0 06/14/2023 1214   O2SAT 100 06/14/2023 1214     Coagulation Profile: No results for input(s): "INR", "PROTIME" in the last 168 hours.  Cardiac Enzymes: No results for input(s): "CKTOTAL", "CKMB", "CKMBINDEX", "TROPONINI" in the last 168 hours.  HbA1C: Hgb A1c MFr Bld  Date/Time Value Ref Range Status  05/24/2023  11:26 AM 5.9 (H) 4.8 - 5.6 % Final    Comment:    (NOTE)         Prediabetes: 5.7 - 6.4  Diabetes: >6.4         Glycemic control for adults with diabetes: <7.0   04/28/2023 02:46 AM 6.2 (H) 4.8 - 5.6 % Final    Comment:    (NOTE) Pre diabetes:          5.7%-6.4%  Diabetes:              >6.4%  Glycemic control for   <7.0% adults with diabetes     CBG: Recent Labs  Lab 06/14/23 0549 06/14/23 1320  GLUCAP 117* 147*    Review of Systems:   As above  Past Medical History:  She,  has a past medical history of Allergy, Anemia, Anxiety, Arthritis, Asthma, BMI 40.0-44.9, adult (HCC) (04/07/2014), DM type 2 (diabetes mellitus, type 2) (HCC), GERD (gastroesophageal reflux disease), HTN (hypertension), Hypercholesterolemia, Osteoarthritis of left hip (04/07/2014), Reflux, Severe aortic stenosis, and Spinal headache.   Surgical History:   Past Surgical History:  Procedure Laterality Date   BACK SURGERY  2023   lumbar fusion, got infection, then had to re-do fusion   CESAREAN SECTION     x3   COLONOSCOPY     LAMINECTOMY  03/2021   LEFT HEART CATH AND CORONARY ANGIOGRAPHY N/A 04/06/2023   Procedure: LEFT HEART CATH AND CORONARY ANGIOGRAPHY;  Surgeon: Tonny Bollman, MD;  Location: [facility] INVASIVE CV LAB;  Service: Cardiovascular;  Laterality: N/A;   SPINAL FUSION  2019   TUBAL LIGATION       Social History:   reports that she has never smoked. She has never used smokeless tobacco. She reports that she does not drink alcohol and does not use drugs.   Family History:  Her family history includes Heart disease in her father; Hypertension in her mother; Multiple sclerosis in her brother and sister; Stroke in her brother and mother. There is no history of Colon cancer, Colon polyps, Esophageal cancer, Rectal cancer, or Stomach cancer.   Allergies Allergies  Allergen Reactions   Crestor [Rosuvastatin] Other (See Comments)    Myalgia    Robaxin [Methocarbamol] Other (See  Comments)    Insomnia   Toradol [Ketorolac Tromethamine] Other (See Comments)   Zocor [Simvastatin] Other (See Comments)    Myalgias    Lipitor [Atorvastatin] Other (See Comments)    Myalgia   Sulfa Antibiotics Hives     Home Medications  Prior to Admission medications   Medication Sig Start Date End Date Taking? Authorizing Provider  acetaminophen (TYLENOL) 500 MG tablet Take 1,000 mg by mouth every 6 (six) hours as needed for mild pain (pain score 1-3) or headache.   Yes [provider]  albuterol (PROAIR HFA) 108 (90 BASE) MCG/ACT inhaler Inhale 2 puffs into the lungs every 6 (six) hours as needed. 07/21/13  Yes Carmelina Dane, MD  amLODipine (NORVASC) 10 MG tablet Take 1 tablet (10 mg total) by mouth daily. Patient taking differently: Take 10 mg by mouth at bedtime. 11/23/17  Yes Wallis Bamberg, PA-C  ascorbic acid (VITAMIN C) 500 MG tablet Take 500 mg by mouth daily.   Yes [provider]  carvedilol (COREG) 25 MG tablet Take 1 tablet (25 mg total) by mouth 2 (two) times daily. 01/13/23  Yes Asa Lente, Tessa N, PA-C  celecoxib (CELEBREX) 200 MG capsule Take 200 mg by mouth 2 (two) times daily.  10/21/17  Yes [provider]  cetirizine (ZYRTEC) 10 MG tablet Take 1 tablet (10 mg total) by mouth daily. 10/09/22  Yes Allwardt, Alyssa M, PA-C  docusate sodium (  COLACE) 100 MG capsule Take 200-300 mg by mouth at bedtime.   Yes [provider]  fluticasone (FLONASE) 50 MCG/ACT nasal spray Place 2 sprays into both nostrils daily. Patient taking differently: Place 2 sprays into both nostrils daily as needed for rhinitis. 10/09/22  Yes Allwardt, Alyssa M, PA-C  furosemide (LASIX) 20 MG tablet Take 2 tablets (40 mg total) by mouth daily. 04/30/23  Yes Amin, Ankit C, MD  gabapentin (NEURONTIN) 600 MG tablet Take 600 mg by mouth 3 (three) times daily. 12/01/21  Yes [provider]  iron polysaccharides (NIFEREX) 150 MG capsule TAKE 1 CAPSULE (150 MG DOSE) BY MOUTH  TWICE A DAY Patient taking differently: Take 150 mg by mouth daily. 12/19/19 06/14/23 Yes Falstreau, Lina Sar, NP  losartan-hydrochlorothiazide (HYZAAR) 100-25 MG tablet Take 1 tablet by mouth daily. 12/06/22  Yes Conte, Tessa N, PA-C  pantoprazole (PROTONIX) 40 MG tablet Take 40 mg by mouth daily. 12/28/21  Yes [provider]  polyvinyl alcohol (LIQUIFILM TEARS) 1.4 % ophthalmic solution Place 1 drop into both eyes as needed for dry eyes.   Yes [provider]  rosuvastatin (CRESTOR) 10 MG tablet Take 1 tablet (10 mg total) by mouth every other day. 02/03/23  Yes Conte, Tessa N, PA-C  sulfamethoxazole-trimethoprim (BACTRIM) 400-80 MG tablet TAKE 1 TABLET BY MOUTH TWICE DAILY 05/26/23  Yes Vu, Tonita Phoenix, MD  metFORMIN (GLUCOPHAGE) 1000 MG tablet Take 1 tablet (1,000 mg total) by mouth 2 (two) times daily with a meal. Patient taking differently: Take 1,000 mg by mouth daily. 05/01/21     Semaglutide, 2 MG/DOSE, (OZEMPIC, 2 MG/DOSE,) 8 MG/3ML SOPN Inject 2 mg into the skin once a week.    [provider]     Critical care time: 70    Lenard Galloway [facility] 06/14/23 1:31 PM  Please see [email or web address] for pager details.  From 7A-7P if no response, please call [phone] After hours, please call ELink [phone]

## 2023-06-14 NOTE — Transfer of Care (Signed)
Immediate Anesthesia Transfer of Care Note  Patient: Jackalyn M Camero  Procedure(s) Performed: ASCENDING AORTIC ROOT REPLACEMENT USING KONECT RESILIA AORTIC VALVE CONDUIT SIZE AND REATTACHMENT OF RIGHT AND LEFT CORONARY (Chest) TRANSESOPHAGEAL ECHOCARDIOGRAM (TEE)  Patient Location: ICU  Anesthesia Type:General  Level of Consciousness: sedated  Airway & Oxygen Therapy: Patient remains intubated per anesthesia plan and Patient placed on Ventilator (see vital sign flow sheet for setting)  Post-op Assessment: Report given to RN and Post -op Vital signs reviewed and stable  Post vital signs: Reviewed and stable  Last Vitals:  Vitals Value Taken Time  BP 101/65 06/14/23 1316  Temp 35.9 C 06/14/23 1318  Pulse 80 06/14/23 1318  Resp 18 06/14/23 1318  SpO2 97 % 06/14/23 1318  Vitals shown include unfiled device data.  Last Pain:  Vitals:   06/14/23 0618  TempSrc:   PainSc: 3       Patients Stated Pain Goal: 2 (06/14/23 0618)  Complications: No notable events documented.

## 2023-06-14 NOTE — Anesthesia Procedure Notes (Signed)
Arterial Line Insertion Start/End1/21/2025 6:50 AM, 06/14/2023 6:55 AM Performed by: Mariann Barter, MD, Yolonda Kida, CRNA, CRNA  Patient location: Pre-op. Preanesthetic checklist: patient identified, IV checked, site marked, risks and benefits discussed, surgical consent, monitors and equipment checked, pre-op evaluation, timeout performed and anesthesia consent Lidocaine 1% used for infiltration and patient sedated Left, radial was placed Catheter size: 20 G Hand hygiene performed  and maximum sterile barriers used   Attempts: 1 Procedure performed without using ultrasound guided technique. Following insertion, dressing applied and Biopatch. Post procedure assessment: normal  Patient tolerated the procedure well with no immediate complications.

## 2023-06-14 NOTE — Anesthesia Preprocedure Evaluation (Signed)
Anesthesia Evaluation  Patient identified by MRN, date of birth, ID band Patient awake    Reviewed: Allergy & Precautions, NPO status , Patient's Chart, lab work & pertinent test results, reviewed documented beta blocker date and time   History of Anesthesia Complications (+) POST - OP SPINAL HEADACHE and history of anesthetic complications  Airway Mallampati: III  TM Distance: >3 FB Neck ROM: Limited  Mouth opening: Limited Mouth Opening  Dental   Pulmonary asthma , neg sleep apnea, neg COPD, neg PE   breath sounds clear to auscultation       Cardiovascular hypertension, (-) angina +CHF and + DOE  (-) CAD, (-) Past MI and (-) Cardiac Stents (-) Cardiac Defibrillator + Valvular Problems/Murmurs AS and AI  Rhythm:Regular Rate:Normal + Systolic murmurs    Neuro/Psych  Headaches, neg Seizures  Anxiety      Neuromuscular disease    GI/Hepatic ,GERD  ,,(+) neg Cirrhosis        Endo/Other  diabetes, Type 2    Renal/GU CRFRenal disease     Musculoskeletal  (+) Arthritis ,    Abdominal   Peds  Hematology  (+) Blood dyscrasia, anemia   Anesthesia Other Findings   Reproductive/Obstetrics                             Anesthesia Physical Anesthesia Plan  ASA: 4  Anesthesia Plan: General   Post-op Pain Management:    Induction: Intravenous  PONV Risk Score and Plan: 2 and Ondansetron and Dexamethasone  Airway Management Planned: Oral ETT and Video Laryngoscope Planned  Additional Equipment: Arterial line, ClearSight, PA Cath, 3D TEE and Ultrasound Guidance Line Placement  Intra-op Plan:   Post-operative Plan: Post-operative intubation/ventilation  Informed Consent: I have reviewed the patients History and Physical, chart, labs and discussed the procedure including the risks, benefits and alternatives for the proposed anesthesia with the patient or authorized representative who has indicated  his/her understanding and acceptance.     Dental advisory given  Plan Discussed with: CRNA  Anesthesia Plan Comments:        Anesthesia Quick Evaluation

## 2023-06-14 NOTE — Anesthesia Procedure Notes (Signed)
Central Venous Catheter Insertion Performed by: Mariann Barter, MD, anesthesiologist Start/End1/21/2025 7:00 AM, 06/14/2023 7:15 AM Patient location: Pre-op. Preanesthetic checklist: patient identified, IV checked, site marked, risks and benefits discussed, surgical consent, monitors and equipment checked, pre-op evaluation, timeout performed and anesthesia consent Position: Trendelenburg Lidocaine 1% used for infiltration and patient sedated Hand hygiene performed , maximum sterile barriers used  and Seldinger technique used Catheter size: 8.5 Fr Total catheter length 10. Central line was placed.Sheath introducer Swan type:thermodilution PA Cath depth:50 Procedure performed using ultrasound guided technique. Ultrasound Notes:anatomy identified, needle tip was noted to be adjacent to the nerve/plexus identified, no ultrasound evidence of intravascular and/or intraneural injection and image(s) printed for medical record Attempts: 1 Following insertion, line sutured and dressing applied. Post procedure assessment: blood return through all ports, free fluid flow and no air  Patient tolerated the procedure well with no immediate complications.

## 2023-06-15 ENCOUNTER — Encounter (HOSPITAL_COMMUNITY): Payer: Self-pay | Admitting: Thoracic Surgery (Cardiothoracic Vascular Surgery)

## 2023-06-15 ENCOUNTER — Inpatient Hospital Stay (HOSPITAL_COMMUNITY): Payer: Medicare HMO

## 2023-06-15 DIAGNOSIS — I442 Atrioventricular block, complete: Secondary | ICD-10-CM | POA: Diagnosis not present

## 2023-06-15 DIAGNOSIS — Z952 Presence of prosthetic heart valve: Secondary | ICD-10-CM | POA: Diagnosis not present

## 2023-06-15 LAB — GLUCOSE, CAPILLARY
Glucose-Capillary: 113 mg/dL — ABNORMAL HIGH (ref 70–99)
Glucose-Capillary: 118 mg/dL — ABNORMAL HIGH (ref 70–99)
Glucose-Capillary: 123 mg/dL — ABNORMAL HIGH (ref 70–99)
Glucose-Capillary: 123 mg/dL — ABNORMAL HIGH (ref 70–99)
Glucose-Capillary: 124 mg/dL — ABNORMAL HIGH (ref 70–99)
Glucose-Capillary: 126 mg/dL — ABNORMAL HIGH (ref 70–99)
Glucose-Capillary: 130 mg/dL — ABNORMAL HIGH (ref 70–99)
Glucose-Capillary: 135 mg/dL — ABNORMAL HIGH (ref 70–99)
Glucose-Capillary: 136 mg/dL — ABNORMAL HIGH (ref 70–99)
Glucose-Capillary: 139 mg/dL — ABNORMAL HIGH (ref 70–99)

## 2023-06-15 LAB — BASIC METABOLIC PANEL
Anion gap: 9 (ref 5–15)
Anion gap: 12 (ref 5–15)
BUN: 18 mg/dL (ref 8–23)
BUN: 22 mg/dL (ref 8–23)
CO2: 23 mmol/L (ref 22–32)
CO2: 21 mmol/L — ABNORMAL LOW (ref 22–32)
Calcium: 8.4 mg/dL — ABNORMAL LOW (ref 8.9–10.3)
Calcium: 8.3 mg/dL — ABNORMAL LOW (ref 8.9–10.3)
Chloride: 105 mmol/L (ref 98–111)
Chloride: 101 mmol/L (ref 98–111)
Creatinine, Ser: 1.4 mg/dL — ABNORMAL HIGH (ref 0.44–1.00)
Creatinine, Ser: 1.83 mg/dL — ABNORMAL HIGH (ref 0.44–1.00)
GFR, Estimated: 41 mL/min — ABNORMAL LOW (ref >60)
GFR, Estimated: 30 mL/min — ABNORMAL LOW (ref >60)
Glucose, Bld: 137 mg/dL — ABNORMAL HIGH (ref 70–99)
Glucose, Bld: 240 mg/dL — ABNORMAL HIGH (ref 70–99)
Potassium: 4.9 mmol/L (ref 3.5–5.1)
Potassium: 4.5 mmol/L (ref 3.5–5.1)
Sodium: 137 mmol/L (ref 135–145)
Sodium: 134 mmol/L — ABNORMAL LOW (ref 135–145)

## 2023-06-15 LAB — CBC
HCT: 32.1 % — ABNORMAL LOW (ref 36.0–46.0)
HCT: 32.6 % — ABNORMAL LOW (ref 36.0–46.0)
Hemoglobin: 10.1 g/dL — ABNORMAL LOW (ref 12.0–15.0)
Hemoglobin: 10.2 g/dL — ABNORMAL LOW (ref 12.0–15.0)
MCH: 25.1 pg — ABNORMAL LOW (ref 26.0–34.0)
MCH: 25.3 pg — ABNORMAL LOW (ref 26.0–34.0)
MCHC: 31.5 g/dL (ref 30.0–36.0)
MCHC: 31.3 g/dL (ref 30.0–36.0)
MCV: 79.7 fL — ABNORMAL LOW (ref 80.0–100.0)
MCV: 80.9 fL (ref 80.0–100.0)
Platelets: 181 10*3/uL (ref 150–400)
Platelets: 167 10*3/uL (ref 150–400)
RBC: 4.03 MIL/uL (ref 3.87–5.11)
RBC: 4.03 MIL/uL (ref 3.87–5.11)
RDW: 17.4 % — ABNORMAL HIGH (ref 11.5–15.5)
RDW: 18.1 % — ABNORMAL HIGH (ref 11.5–15.5)
WBC: 14.2 10*3/uL — ABNORMAL HIGH (ref 4.0–10.5)
WBC: 18.3 10*3/uL — ABNORMAL HIGH (ref 4.0–10.5)
nRBC: 0 % (ref 0.0–0.2)
nRBC: 0 % (ref 0.0–0.2)

## 2023-06-15 LAB — MAGNESIUM
Magnesium: 3.2 mg/dL — ABNORMAL HIGH (ref 1.7–2.4)
Magnesium: 2.8 mg/dL — ABNORMAL HIGH (ref 1.7–2.4)

## 2023-06-15 MED ORDER — EZETIMIBE 10 MG PO TABS
10.0000 mg | ORAL_TABLET | Freq: Every day | ORAL | Status: DC
  Administered 2023-06-15 – 2023-06-24 (×10): 10 mg via ORAL
  Filled 2023-06-15 (×10): qty 1

## 2023-06-15 MED ORDER — ENOXAPARIN SODIUM 40 MG/0.4ML IJ SOSY
40.0000 mg | PREFILLED_SYRINGE | Freq: Every day | INTRAMUSCULAR | Status: DC
  Administered 2023-06-15: 40 mg via SUBCUTANEOUS
  Filled 2023-06-15: qty 0.4

## 2023-06-15 MED ORDER — ORAL CARE MOUTH RINSE: 15.0000 mL | OROMUCOSAL | Status: DC | PRN

## 2023-06-15 MED ORDER — INSULIN ASPART 100 UNIT/ML IJ SOLN
0.0000 [IU] | INTRAMUSCULAR | Status: DC
Start: 2023-06-15 — End: 2023-06-16
  Administered 2023-06-15 – 2023-06-16 (×7): 2 [IU] via SUBCUTANEOUS

## 2023-06-15 MED ORDER — ROSUVASTATIN CALCIUM 5 MG PO TABS
10.0000 mg | ORAL_TABLET | ORAL | Status: DC
  Administered 2023-06-15 – 2023-06-23 (×5): 10 mg via ORAL
  Filled 2023-06-15 (×5): qty 2

## 2023-06-15 MED ORDER — FUROSEMIDE 10 MG/ML IJ SOLN
40.0000 mg | Freq: Once | INTRAMUSCULAR | Status: AC
  Administered 2023-06-15: 40 mg via INTRAVENOUS
  Filled 2023-06-15: qty 4

## 2023-06-15 NOTE — Progress Notes (Signed)
NAME:  [name], MRN:  [id number], DOB:  [birth year], LOS: 1 ADMISSION DATE:  06/14/2023, CONSULTATION DATE:  06/14/2023 REFERRING MD:  Tereso Newcomer, CHIEF COMPLAINT:  post-operative management   History of Present Illness:  68 year old female with past medical history of asthma, T2DM, HTN, HLD, severe aortic stenosis who presents for aortic valve replacement, possible aortic root replacement.  Patient had recent admission to [facility] 04/27/23-04/30/23 for progressive dyspnea requiring 4LNC. Appeared to be FVO. She was diuresed and eventually weaned off nitroglycerin gtt for concomitant hypertensive emergency. She had echo which showed EF 60-65%, no RWMA, G2DD, moderate AVR, severe AV stenosis. She had follow-up with outpatient cardiology and TCTS.   Intraoperatively, received AVR and root replacement. To ICU from OR on Epi, NE, dex, propofol, insulin. Getting albumin. X1 CT.   Pump time: 2h 43m Xclamp time: 2h 72m EBL: 476cc Cell saver: 230cc 2UPRBC  Pertinent  Medical History  T2DM, HTN, HFpEF, HLD, asthma, aortic stenosis   Significant Hospital Events: Including procedures, antibiotic start and stop dates in addition to other pertinent events   1/21: s/p AVR, aortic root replacement to ICU from OR post-operatively  1/22: Lasix 40mg  IV. NAEON. Extubated yesterday   Interim History / Subjective:  Awake, alert and oriented. She is feeling okay, expected post-operative pain. Denies shortness of breath or chest pain. Extubated yesterday without incident. CVP this morning is 16. Net +2800. Getting 40mg  Lasix. Pressors off yesterday. Con't on endotool  Objective   Blood pressure 108/66, pulse 93, temperature 99 F (37.2 C), resp. rate 15, height 4\' 9"  (1.448 m), weight 89.8 kg, SpO2 96%. PAP: (30-51)/(13-28) 47/24 CVP:  [8 mmHg-21 mmHg] 19 mmHg CO:  [2.9 L/min-5 L/min] 5 L/min CI:  [1.6 L/min/m2-2.77 L/min/m2] 2.77 L/min/m2  Vent Mode: BIPAP;PSV FiO2 (%):  [40 %-60 %] 40 % Set Rate:   [4 bmp-20 bmp] 4 bmp Vt Set:  [400 mL] 400 mL PEEP:  [5 cmH20] 5 cmH20 Pressure Support:  [5 cmH20-10 cmH20] 5 cmH20   Intake/Output Summary (Last 24 hours) at 06/15/2023 0920 Last data filed at 06/15/2023 0900 Gross per 24 hour  Intake 4568.09 ml  Output 2658 ml  Net 1910.09 ml   Filed Weights   06/13/23 1200 06/14/23 0552 06/15/23 0500  Weight: 90.7 kg 89.8 kg 89.8 kg    Examination: General: middle aged female, laying in bed, no acute distress  HENT: [place]/AT, anicteric sclera, PERRLA, nasal cannula 4L, mmm  Lungs: clear bilaterally, nasal cannula 4L, no respiratory distress  Cardiovascular: paced, underlying rhythm bradycardia, no jvd, trace edema  Abdomen: rounded, soft, non-distended  Extremities: trace edema, warm Neuro: tired, but awake, alert and oriented, non focal exam GU: foley   Resolved Hospital Problem list   Post-operative vent management  Post bypass vasoplegia   Assessment & Plan:  Severe aortic stenosis s/p AVR Severe aortic root calcification s/p aortic root replacement  Expected post-operative ABLA Expected post-operative consumptive thrombocytopenia  Severe AS with moderate AVR now s/p replacement. Intraoperatively found to have significant aortic root calcifications and proceeded with root replacement. CVP 16 1/22 AM, net +2.8L - Lasix 40mg  IV x1, monitor output  - post-op management per TCTS - tele monitoring/ pacing prn - insulin gtt wean per protocol  - mediastinal drain per TCTS - multimodal pain control per protocol- oxycodone, tramadol, morphine with bowel regimen - ASA 324 daily - metoprolol BID and PRN - complete post-op antibiotics - monitor electrolytes, replete PRN   Diabetes  - cbg q4 -  on endotool - transition to ssi per protocol   Hypertension  - metoprolol 12.5mg  BID  Hyperlipidemia  -restart statin per TCTS    Best Practice (right click and "Reselect all SmartList Selections" daily)   Diet/type: Regular consistency (see  orders) and NPO DVT prophylaxis: LMWH Pressure ulcer(s): none GI prophylaxis: PPI Lines: Central line, Arterial Line, and yes and it is still needed Foley:  Yes, and it is still needed Code Status:  full code Last date of multidisciplinary goals of care discussion [per primary]  Labs   CBC: Recent Labs  Lab 06/14/23 0626 06/14/23 0804 06/14/23 1120 06/14/23 1124 06/14/23 1321 06/14/23 1323 06/14/23 1618 06/14/23 1813 06/14/23 1850 06/14/23 1958 06/15/23 0255  WBC 5.1  --   --   --  19.9*  --   --   --  12.8*  --  14.2*  HGB 10.3*   < > 6.5*   < > 10.0*   < > 11.2* 10.9* 10.6* 10.2* 10.1*  HCT 33.8*   < > 21.2*   < > 32.0*   < > 33.0* 32.0* 33.4* 30.0* 32.1*  MCV 79.9*  --   --   --  81.2  --   --   --  80.1  --  79.7*  PLT 270  --  188  --  188  --   --   --  181  --  181   < > = values in this interval not displayed.    Basic Metabolic Panel: Recent Labs  Lab 06/14/23 0626 06/14/23 0804 06/14/23 1049 06/14/23 1124 06/14/23 1151 06/14/23 1214 06/14/23 1219 06/14/23 1323 06/14/23 1618 06/14/23 1813 06/14/23 1850 06/14/23 1958 06/15/23 0255  NA 141   < > 137   < > 136   < > 137   < > 139 139 139 139 137  K 3.8   < > 5.2*   < > 5.7*   < > 5.1   < > 4.9 4.7 4.7 5.7* 4.9  CL 106   < > 103  --  104  --  103  --   --   --  106  --  105  CO2 27  --   --   --   --   --   --   --   --   --  23  --  23  GLUCOSE 115*   < > 191*  --  187*  --  239*  --   --   --  120*  --  137*  BUN 16   < > 18  --  20  --  18  --   --   --  15  --  18  CREATININE 1.34*   < > 1.30*  --  1.30*  --  1.30*  --   --   --  1.31*  --  1.40*  CALCIUM 9.5  --   --   --   --   --   --   --   --   --  8.4*  --  8.4*  MG  --   --   --   --   --   --   --   --   --   --  3.8*  --  3.2*   < > = values in this interval not displayed.   GFR: Estimated Creatinine Clearance: 36.9 mL/min (A) (by C-G formula based on SCr of 1.4  mg/dL (H)). Recent Labs  Lab 06/14/23 0626 06/14/23 1321 06/14/23 1850  06/15/23 0255  WBC 5.1 19.9* 12.8* 14.2*    Liver Function Tests: Recent Labs  Lab 06/14/23 0626  AST 22  ALT 21  ALKPHOS 69  BILITOT 0.4  PROT 7.0  ALBUMIN 3.8   No results for input(s): "LIPASE", "AMYLASE" in the last 168 hours. No results for input(s): "AMMONIA" in the last 168 hours.  ABG    Component Value Date/Time   PHART 7.313 (L) 06/14/2023 1958   PCO2ART 52.4 (H) 06/14/2023 1958   PO2ART 110 (H) 06/14/2023 1958   HCO3 26.5 06/14/2023 1958   TCO2 28 06/14/2023 1958   ACIDBASEDEF 3.0 (H) 06/14/2023 1813   O2SAT 98 06/14/2023 1958     Coagulation Profile: Recent Labs  Lab 06/14/23 1321  INR 1.4*    Cardiac Enzymes: No results for input(s): "CKTOTAL", "CKMB", "CKMBINDEX", "TROPONINI" in the last 168 hours.  HbA1C: Hgb A1c MFr Bld  Date/Time Value Ref Range Status  05/24/2023 11:26 AM 5.9 (H) 4.8 - 5.6 % Final    Comment:    (NOTE)         Prediabetes: 5.7 - 6.4         Diabetes: >6.4         Glycemic control for adults with diabetes: <7.0   04/28/2023 02:46 AM 6.2 (H) 4.8 - 5.6 % Final    Comment:    (NOTE) Pre diabetes:          5.7%-6.4%  Diabetes:              >6.4%  Glycemic control for   <7.0% adults with diabetes     CBG: Recent Labs  Lab 06/15/23 0050 06/15/23 0250 06/15/23 0401 06/15/23 0441 06/15/23 0707  GLUCAP 113* 139* 124* 118* 135*    Review of Systems:   As above  Past Medical History:  She,  has a past medical history of Allergy, Anemia, Anxiety, Arthritis, Asthma, BMI 40.0-44.9, adult (HCC) (04/07/2014), DM type 2 (diabetes mellitus, type 2) (HCC), GERD (gastroesophageal reflux disease), HTN (hypertension), Hypercholesterolemia, Osteoarthritis of left hip (04/07/2014), Reflux, Severe aortic stenosis, and Spinal headache.   Surgical History:   Past Surgical History:  Procedure Laterality Date   ASCENDING AORTIC ROOT REPLACEMENT N/A 06/14/2023   Procedure: ASCENDING AORTIC ROOT REPLACEMENT USING KONECT RESILIA  AORTIC VALVE CONDUIT SIZE AND REATTACHMENT OF RIGHT AND LEFT CORONARY;  Surgeon: Eugenio Hoes, MD;  Location: [facility] OR;  Service: Open Heart Surgery;  Laterality: N/A;   BACK SURGERY  2023   lumbar fusion, got infection, then had to re-do fusion   CESAREAN SECTION     x3   COLONOSCOPY     LAMINECTOMY  03/2021   LEFT HEART CATH AND CORONARY ANGIOGRAPHY N/A 04/06/2023   Procedure: LEFT HEART CATH AND CORONARY ANGIOGRAPHY;  Surgeon: Tonny Bollman, MD;  Location: [facility] INVASIVE CV LAB;  Service: Cardiovascular;  Laterality: N/A;   SPINAL FUSION  2019   TEE WITHOUT CARDIOVERSION N/A 06/14/2023   Procedure: TRANSESOPHAGEAL ECHOCARDIOGRAM (TEE);  Surgeon: Eugenio Hoes, MD;  Location: [facility] OR;  Service: Open Heart Surgery;  Laterality: N/A;   TUBAL LIGATION       Social History:   reports that she has never smoked. She has never used smokeless tobacco. She reports that she does not drink alcohol and does not use drugs.   Family History:  Her family history includes Heart disease in her father; Hypertension in her mother; Multiple  sclerosis in her brother and sister; Stroke in her brother and mother. There is no history of Colon cancer, Colon polyps, Esophageal cancer, Rectal cancer, or Stomach cancer.   Allergies Allergies  Allergen Reactions   Crestor [Rosuvastatin] Other (See Comments)    Myalgia    Robaxin [Methocarbamol] Other (See Comments)    Insomnia   Toradol [Ketorolac Tromethamine] Other (See Comments)   Zocor [Simvastatin] Other (See Comments)    Myalgias    Lipitor [Atorvastatin] Other (See Comments)    Myalgia   Sulfa Antibiotics Hives     Home Medications  Prior to Admission medications   Medication Sig Start Date End Date Taking? Authorizing Provider  acetaminophen (TYLENOL) 500 MG tablet Take 1,000 mg by mouth every 6 (six) hours as needed for mild pain (pain score 1-3) or headache.   Yes [provider]  albuterol (PROAIR HFA) 108 (90 BASE) MCG/ACT inhaler  Inhale 2 puffs into the lungs every 6 (six) hours as needed. 07/21/13  Yes Carmelina Dane, MD  amLODipine (NORVASC) 10 MG tablet Take 1 tablet (10 mg total) by mouth daily. Patient taking differently: Take 10 mg by mouth at bedtime. 11/23/17  Yes Wallis Bamberg, PA-C  ascorbic acid (VITAMIN C) 500 MG tablet Take 500 mg by mouth daily.   Yes [provider]  carvedilol (COREG) 25 MG tablet Take 1 tablet (25 mg total) by mouth 2 (two) times daily. 01/13/23  Yes Asa Lente, Tessa N, PA-C  celecoxib (CELEBREX) 200 MG capsule Take 200 mg by mouth 2 (two) times daily.  10/21/17  Yes [provider]  cetirizine (ZYRTEC) 10 MG tablet Take 1 tablet (10 mg total) by mouth daily. 10/09/22  Yes Allwardt, Alyssa M, PA-C  docusate sodium (COLACE) 100 MG capsule Take 200-300 mg by mouth at bedtime.   Yes [provider]  fluticasone (FLONASE) 50 MCG/ACT nasal spray Place 2 sprays into both nostrils daily. Patient taking differently: Place 2 sprays into both nostrils daily as needed for rhinitis. 10/09/22  Yes Allwardt, Alyssa M, PA-C  furosemide (LASIX) 20 MG tablet Take 2 tablets (40 mg total) by mouth daily. 04/30/23  Yes Amin, Ankit C, MD  gabapentin (NEURONTIN) 600 MG tablet Take 600 mg by mouth 3 (three) times daily. 12/01/21  Yes [provider]  iron polysaccharides (NIFEREX) 150 MG capsule TAKE 1 CAPSULE (150 MG DOSE) BY MOUTH TWICE A DAY Patient taking differently: Take 150 mg by mouth daily. 12/19/19 06/14/23 Yes Falstreau, Lina Sar, NP  losartan-hydrochlorothiazide (HYZAAR) 100-25 MG tablet Take 1 tablet by mouth daily. 12/06/22  Yes Conte, Tessa N, PA-C  pantoprazole (PROTONIX) 40 MG tablet Take 40 mg by mouth daily. 12/28/21  Yes [provider]  polyvinyl alcohol (LIQUIFILM TEARS) 1.4 % ophthalmic solution Place 1 drop into both eyes as needed for dry eyes.   Yes [provider]  rosuvastatin (CRESTOR) 10 MG tablet Take 1 tablet (10 mg total) by mouth every other  day. 02/03/23  Yes Conte, Tessa N, PA-C  sulfamethoxazole-trimethoprim (BACTRIM) 400-80 MG tablet TAKE 1 TABLET BY MOUTH TWICE DAILY 05/26/23  Yes Vu, Tonita Phoenix, MD  metFORMIN (GLUCOPHAGE) 1000 MG tablet Take 1 tablet (1,000 mg total) by mouth 2 (two) times daily with a meal. Patient taking differently: Take 1,000 mg by mouth daily. 05/01/21     Semaglutide, 2 MG/DOSE, (OZEMPIC, 2 MG/DOSE,) 8 MG/3ML SOPN Inject 2 mg into the skin once a week.    [provider]     Critical  care time: 71    Lenard Galloway [facility] 06/15/23 9:20 AM  Please see [email or web address] for pager details.  From 7A-7P if no response, please call [phone] After hours, please call ELink [phone]

## 2023-06-15 NOTE — Progress Notes (Signed)
[address]       [place] [address]             [phone]      1 Day Post-Op  Procedure(s) (LRB): ASCENDING AORTIC ROOT REPLACEMENT USING KONECT RESILIA AORTIC VALVE CONDUIT SIZE AND REATTACHMENT OF RIGHT AND LEFT CORONARY (N/A) TRANSESOPHAGEAL ECHOCARDIOGRAM (TEE) (N/A)   Total Length of Stay:  LOS: 1 day    SUBJECTIVE: No complaints  Vitals:   06/15/23 0745 06/15/23 0800  BP: 129/71 108/66  Pulse: 79 93  Resp: 12 15  Temp: 99.1 F (37.3 C) 99 F (37.2 C)  SpO2: 98% 96%    Intake/Output      01/21 0701 01/22 0700 01/22 0701 01/23 0700   I.V. (mL/kg) 3072.2 (34.2)    Blood 860    NG/GT 100    IV Piggyback 1543.3    Total Intake(mL/kg) 5575.5 (62.1)    Urine (mL/kg/hr) 1898 (0.9)    Blood 476    Chest Tube 349    Total Output 2723    Net +2852.5             sodium chloride 20 mL/hr at 06/15/23 0600   sodium chloride     sodium chloride 10 mL/hr at 06/15/23 0600   albumin human Stopped (06/14/23 2001)    ceFAZolin (ANCEF) IV 2 g (06/15/23 0609)   dexmedetomidine (PRECEDEX) IV infusion Stopped (06/14/23 1632)   insulin 1.1 Units/hr (06/15/23 0600)   lactated ringers Stopped (06/14/23 1310)   lactated ringers 20 mL/hr at 06/14/23 1700   niCARDipine Stopped (06/14/23 1310)    CBC    Component Value Date/Time   WBC 14.2 (H) 06/15/2023 0255   RBC 4.03 06/15/2023 0255   HGB 10.1 (L) 06/15/2023 0255   HGB 10.4 (L) 03/29/2023 0946   HCT 32.1 (L) 06/15/2023 0255   HCT 34.7 03/29/2023 0946   PLT 181 06/15/2023 0255   PLT 313 03/29/2023 0946   MCV 79.7 (L) 06/15/2023 0255   MCV 79 03/29/2023 0946   MCH 25.1 (L) 06/15/2023 0255   MCHC 31.5 06/15/2023 0255   RDW 17.4 (H) 06/15/2023 0255   RDW 16.6 (H) 03/29/2023 0946   LYMPHSABS 2.1 04/28/2023 0246   LYMPHSABS 2.1 10/06/2017 0927   MONOABS 0.6 04/28/2023 0246   EOSABS 0.3 04/28/2023 0246   EOSABS 0.1 10/06/2017 0927   BASOSABS 0.1 04/28/2023 0246   BASOSABS 0.0  10/06/2017 0927   CMP     Component Value Date/Time   NA 137 06/15/2023 0255   NA 142 03/29/2023 0946   K 4.9 06/15/2023 0255   CL 105 06/15/2023 0255   CO2 23 06/15/2023 0255   GLUCOSE 137 (H) 06/15/2023 0255   BUN 18 06/15/2023 0255   BUN 22 03/29/2023 0946   CREATININE 1.40 (H) 06/15/2023 0255   CREATININE 1.40 (H) 04/26/2023 1053   CALCIUM 8.4 (L) 06/15/2023 0255   PROT 7.0 06/14/2023 0626   PROT 7.2 02/03/2023 0847   ALBUMIN 3.8 06/14/2023 0626   ALBUMIN 4.5 02/03/2023 0847   AST 22 06/14/2023 0626   ALT 21 06/14/2023 0626   ALKPHOS 69 06/14/2023 0626   BILITOT 0.4 06/14/2023 0626   BILITOT <0.2 02/03/2023 0847   GFRNONAA 41 (L) 06/15/2023 0255   GFRNONAA 83 11/12/2015 1436   GFRAA >60 02/01/2018 1703   GFRAA >89 11/12/2015 1436   ABG    Component Value Date/Time   PHART 7.313 (L) 06/14/2023 1958  PCO2ART 52.4 (H) 06/14/2023 1958   PO2ART 110 (H) 06/14/2023 1958   HCO3 26.5 06/14/2023 1958   TCO2 28 06/14/2023 1958   ACIDBASEDEF 3.0 (H) 06/14/2023 1813   O2SAT 98 06/14/2023 1958   CBG (last 3)  Recent Labs    06/15/23 0401 06/15/23 0441 06/15/23 0707  GLUCAP 124* 118* 135*  EXAM Lungs: clear Card: RR paced Ext; warm Neuro: intact   ASSESSMENT: POD #1 SP Aortic root replacement Hemodynamics good off all drips. Will remove swan and aline. Pulmonary: on nasal oxygen. Will start diuresis. CXR appears to have some Left effusion. Will leave CT in till oob and see if more output prior to pulling Will need EP to see for CHB. With calcified root as it was not surprised with amount of debridment that has CHB Leave foley today  OOB   Eugenio Hoes, MD 06/15/2023

## 2023-06-15 NOTE — Progress Notes (Signed)
Pt taken off Bipap per ABG results. Pt tolerating well. Pt placed on 4L [place]

## 2023-06-15 NOTE — Discharge Summary (Signed)
[address]       [place] [id number]             [phone]    Physician Discharge Summary  Patient ID: Olivia [name] MRN: [id number] DOB/AGE: 12-03-1955 68 y.o.  Admit date: 06/14/2023 Discharge date: 06/24/2023  Admission Diagnoses:  Patient Active Problem List   Diagnosis Date Noted   AKI (acute kidney injury) (HCC) 06/19/2023   Complete heart block (HCC) 06/17/2023   Hyperglycemia 06/17/2023   Fever 06/17/2023   S/P AVR (aortic valve replacement) and aortoplasty 06/14/2023   Acute CHF (congestive heart failure) (HCC) 04/28/2023   Acute exacerbation of CHF (congestive heart failure) (HCC) 04/27/2023   Severe aortic stenosis    Stage 3b chronic kidney disease (HCC) 01/25/2023   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   Microcytic anemia 01/25/2023   Chronic kidney disease, stage 3a (HCC) 07/08/2022   Class 2 severe obesity due to excess calories with serious comorbidity and body mass index (BMI) of 38.0 to 38.9 in adult [name]) 07/08/2022   Primary osteoarthritis of right knee 07/08/2022   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   Bacteremia due to Proteus species    Sepsis (HCC) 02/03/2022   Iron deficiency anemia 02/03/2022   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   S/P lumbar spinal fusion 11/17/2021   Lumbar radiculopathy 04/21/2021   Type 2 diabetes mellitus without complication, without long-term current use of insulin (HCC) 12/22/2018   Hypertriglyceridemia 10/19/2018   Spondylolisthesis of lumbar region 02/01/2018   Gastroesophageal reflux disease without esophagitis 01/16/2018   Skin inflammation 08/25/2017   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   Essential hypertension 03/06/2017   Osteoarthritis of left hip 04/07/2014   BMI 40.0-44.9, adult (HCC) 04/07/2014   DM type 2 (diabetes mellitus, type 2) (HCC)    HTN (hypertension)    Hypercholesterolemia     Reflux      Discharge Diagnoses:  Patient Active Problem List   Diagnosis Date Noted   AKI (acute kidney injury) (HCC) 06/19/2023   Complete heart block (HCC) 06/17/2023   Hyperglycemia 06/17/2023   Fever 06/17/2023   S/P AVR (aortic valve replacement) and aortoplasty 06/14/2023   Acute CHF (congestive heart failure) (HCC) 04/28/2023   Acute exacerbation of CHF (congestive heart failure) (HCC) 04/27/2023   Severe aortic stenosis    Stage 3b chronic kidney disease (HCC) 01/25/2023   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   Microcytic anemia 01/25/2023   Chronic kidney disease, stage 3a (HCC) 07/08/2022   Class 2 severe obesity due to excess calories with serious comorbidity and body mass index (BMI) of 38.0 to 38.9 in adult (HCC) 07/08/2022   Primary osteoarthritis of right knee 07/08/2022   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   Bacteremia due to Proteus species    Sepsis (HCC) 02/03/2022   Iron deficiency anemia 02/03/2022   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   S/P lumbar spinal fusion 11/17/2021   Lumbar radiculopathy 04/21/2021   Type 2 diabetes mellitus without complication, without long-term current use of insulin (HCC) 12/22/2018   Hypertriglyceridemia 10/19/2018   Spondylolisthesis of lumbar region 02/01/2018   Gastroesophageal reflux disease without esophagitis 01/16/2018   Skin inflammation 08/25/2017   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   Essential hypertension 03/06/2017   Osteoarthritis of left hip 04/07/2014   BMI 40.0-44.9, adult (HCC) 04/07/2014   DM type 2 (diabetes mellitus,  type 2) (HCC)    HTN (hypertension)    Hypercholesterolemia    Reflux      Discharged Condition: good   Olivia Stade, MD Olivia Inch, MD  History of Present Illness: At time of CT surgical evaluation Pt is a 68 yo female who has been suffering from DOE and has been found to have severe  AS with a mean gradient of and EF of 70%. Pt recently admitted with CHF at [facility] and was discharged. She has had a work up with echo and cath (no CAD) and on CTA work up was found to have very small annulus which is prohibitive for a sapien valve and for a medtronic would risk coronary obstruction. She is here to discuss options. She has had [name] surgeries which needed hardware removal and now walks with a cane at home  Following full review of the patient and all relevant studies Dr. Leafy [name] recommended proceeding with aortic valve replacement as her best surgical option.  Hospital course: Patient was admitted electively and on 06/14/2023 taken the operating room at which time she underwent aortic root replacement with connect Resilia aortic valve conduit.  She tolerated the procedure well was taken to the surgical intensive care unit in stable condition.  Postoperative hospital course:  Patient was extubated using standard post cardiac surgical protocols without difficulty.  Expected volume overload was treated with appropriate diuresis, not related to heart failure or Olivia [name] significant long-term.  Hemodynamics were stable on postop day 1 with all drips off.  A-line and Swan were removed.  She did have a complete heart block and EP was consulted to assist with this due to the potential need for permanent pacemaker if it did not resolve on its own.  This is felt not to be a surprising finding as there was significant calcification in the aortic root requiring debridement.  She has been observed for a few days and is maintaining a complete heart block. She did develop some low-grade fevers with cultures being positive for Enterococcus faecalis.  She was started on Augmentin for this.  Foley has been removed.  She did have a bump in her creatinine but is trending over time lower.  Heart rhythm over time did improve to a sinus rhythm.  She did have episodes of atrial flutter that did require  initiating amiodarone.  She has also been started on apixaban.  He was determined she would not require permanent pacing.  Home diabetic medications will be resumed at time of discharge.  Home health arrangements have been made as well as some equipment to assist with rehab.  Incisions are noted to be healing well without evidence of infection.  She is tolerating diet.  Oxygen has been weaned and she maintains good saturations on room air.  Renal function has been pretty stable over time.  Most recent creatinine is 1.06.  She does have an expected acute blood loss anemia and values are stable over time.  Most recent hemoglobin hematocrit dated 06/24/2023 was 9.4 and 30.4 respectively.  She does have a leukocytosis which is felt to be clinically related to review both steroid use and positive sputum cultures.  She will continue a course of Augmentin for a full 10 days.  She has had some elevated blood pressure readings and ARB has been reinitiated at home dose as well as Norvasc and low-dose beta-blocker.  Overall, at the time of discharge the patient is felt to be quite stable.  Consults: cardiology  and pulmonary/intensive care. (Electrophysiology)  Significant Diagnostic Studies:  VAS [place] LOWER EXTREMITY VENOUS (DVT) Result Date: 06/22/2023  Lower Venous DVT Study Patient Name:  Olivia [name]  Date of Exam:   06/22/2023 Medical Rec #: [id number]          Accession #:    [id number] Date of Birth: Aug 30, 1955          Patient Gender: F Patient Age:   35 years Exam Location:  [facility] Procedure:      VAS [place] LOWER EXTREMITY VENOUS (DVT) Referring Phys: Renae Fickle HOFFMAN --------------------------------------------------------------------------------  Indications: Pain.  Risk Factors: Surgery Ascending aortic root replacement 06/14/23 obesity and past pregnancy. Comparison Study: No significant changes seens ince previous exam 08/24/16. Performing Technologist: Shona Simpson  Examination Guidelines: A complete  evaluation includes B-mode imaging, spectral Doppler, color Doppler, and power Doppler as needed of all accessible portions of each vessel. Bilateral testing is considered an integral part of a complete examination. Limited examinations for reoccurring indications may be performed as noted. The reflux portion of the exam is performed with the patient in reverse Trendelenburg.  +---------+---------------+---------+-----------+----------+--------------+ RIGHT    CompressibilityPhasicitySpontaneityPropertiesThrombus Aging +---------+---------------+---------+-----------+----------+--------------+ CFV      Full           Yes      Yes                                 +---------+---------------+---------+-----------+----------+--------------+ SFJ      Full                                                        +---------+---------------+---------+-----------+----------+--------------+ FV Prox  Full                                                        +---------+---------------+---------+-----------+----------+--------------+ FV Mid   Full                                                        +---------+---------------+---------+-----------+----------+--------------+ FV DistalFull                                                        +---------+---------------+---------+-----------+----------+--------------+ PFV      Full                                                        +---------+---------------+---------+-----------+----------+--------------+ POP      Full           Yes      Yes                                 +---------+---------------+---------+-----------+----------+--------------+  PTV      Full                                                        +---------+---------------+---------+-----------+----------+--------------+ PERO     Full                                                         +---------+---------------+---------+-----------+----------+--------------+   +----+---------------+---------+-----------+----------+--------------+ LEFTCompressibilityPhasicitySpontaneityPropertiesThrombus Aging +----+---------------+---------+-----------+----------+--------------+ CFV Full           Yes      Yes                                 +----+---------------+---------+-----------+----------+--------------+     Summary: RIGHT: - There is no evidence of deep vein thrombosis in the lower extremity.  - No cystic structure found in the popliteal fossa.  LEFT: - No evidence of common femoral vein obstruction.   *See table(s) above for measurements and observations. Electronically signed by Heath Lark on 06/22/2023 at 4:14:41 PM.    Final    DG Ankle 2 Views Right Result Date: 06/21/2023 CLINICAL DATA:  Right ankle pain. EXAM: RIGHT ANKLE - 2 VIEW COMPARISON:  None Available. FINDINGS: There is no evidence of fracture, dislocation, or joint effusion. The ankle mortise is preserved. Minor tibial talar spurring. Small plantar calcaneal spur. Small Achilles tendon enthesophyte. No erosions or focal bone abnormality. No focal soft tissue abnormalities. IMPRESSION: 1. No acute findings. 2. Minimal tibial talar spurring. Small plantar calcaneal spur and Achilles tendon enthesophyte. Electronically Signed   By: Narda Rutherford M.D.   On: 06/21/2023 16:52   DG Chest 1 View Result Date: 06/20/2023 CLINICAL DATA:  Postop aortic valve replacement. EXAM: CHEST  1 VIEW COMPARISON:  Radiographs 06/16/2023 and 06/15/2023.  CT 04/15/2023. FINDINGS: 0839 hours. The right IJ sheath and mediastinal drains have been removed in the interval. The heart size and mediastinal contours are stable status post recent median sternotomy and aortic valve replacement. There is improved aeration of both lung bases with mild residual bibasilar atelectasis and probable small pleural effusions. No evidence of pneumothorax or  definite edema. IMPRESSION: Improved aeration of both lung bases with mild residual bibasilar atelectasis and probable small pleural effusions. No evidence of pneumothorax. Electronically Signed   By: Carey Bullocks M.D.   On: 06/20/2023 12:29   DG Chest Port 1 View Result Date: 06/16/2023 CLINICAL DATA:  S/P AVR [id number] EXAM: PORTABLE CHEST 1 VIEW COMPARISON:  06/15/2023 FINDINGS: Pulmonary arterial catheter has been removed. Right jugular central line is still present. Again noted are chest tubes. Persistent subsegmental atelectasis in the right lung has minimally changed. Low lung volumes are similar. Negative for a pneumothorax. Heart size remains enlarged with postoperative changes. Vascular crowding to the low lung volumes without overt pulmonary edema. Retrocardiac densities probably represent atelectasis. IMPRESSION: 1. Removal of the pulmonary arterial catheter. 2. Chest tubes without pneumothorax. 3. Low lung volumes with subsegmental atelectasis in the right lung. Probable atelectasis or consolidation in the retrocardiac region. Electronically Signed   By: Richarda Overlie M.D.   On: 06/16/2023 07:54  DG Chest Port 1 View Result Date: 06/15/2023 CLINICAL DATA:  Status post AVR. EXAM: PORTABLE CHEST 1 VIEW COMPARISON:  Chest x-ray from yesterday. FINDINGS: Interval extubation and removal of the enteric tube. Unchanged right internal jugular Swan-Ganz catheter. Unchanged mediastinal drains. Stable cardiomediastinal silhouette status post AVR. Similar low lung volumes with central pulmonary vascular congestion, bibasilar atelectasis, and small bilateral pleural effusions. No pneumothorax. No acute osseous abnormality. IMPRESSION: 1. Interval extubation. 2. Similar low lung volumes with central pulmonary vascular congestion, bibasilar atelectasis, and small bilateral pleural effusions. Electronically Signed   By: Obie Dredge M.D.   On: 06/15/2023 09:44   ECHO INTRAOPERATIVE TEE Result Date:  06/14/2023  *INTRAOPERATIVE TRANSESOPHAGEAL REPORT *  Patient Name:   Olivia [name] Date of Exam: 06/14/2023 Medical Rec #:  [id number]         Height:       57.0 in Accession #:    [id number]        Weight:       198.0 lb Date of Birth:  May 26, 1955         BSA:          1.79 m Patient Age:    68 years          BP:           146/73 mmHg Patient Gender: F                 HR:           87 bpm. Exam Location:  Anesthesiology Transesophogeal exam was perform intraoperatively during surgical procedure. Patient was closely monitored under general anesthesia during the entirety of examination. Indications:     Aortic Stenosis i35.0 Sonographer:     Irving Burton Senior RDCS Performing Phys: [id number] Eugenio Hoes Diagnosing Phys: Roslynn Amble Complications: No known complications during this procedure. POST-OP IMPRESSIONS _ Left Ventricle: The left ventricle is unchanged from pre-bypass. has normal systolic function. Underfilled cavity. _ Right Ventricle: moderately reduced function after chest closure; epinephrine 22mcg/min added for support. _ Aorta: there is no dissection present in the aorta. _ Left Atrial Appendage: The left atrial appendage appears unchanged from pre-bypass. _ Aortic Valve: Status post bioprosthetic aortic valve replacement. Normally functioning prosthetic valve with no visualized regurgitation. Mean transvalvular gradient of . _ Mitral Valve: No stenosis present. There is moderate regurgitation.No flow reversal in the pulmonary veins. _ Tricuspid Valve: The tricuspid valve appears unchanged from pre-bypass. There is mild regurgitation. _ Pulmonic Valve: The pulmonic valve appears unchanged from pre-bypass. _ Interatrial Septum: The interatrial septum appears unchanged from pre-bypass. _ Pericardium: The pericardium appears unchanged from pre-bypass. PRE-OP FINDINGS  Left Ventricle: The left ventricle has normal systolic function, with an ejection fraction of 60-65%. The cavity size was normal. There is  moderate left ventricular hypertrophy. Left ventricular diastolic function could not be evaluated. Right Ventricle: The right ventricle has normal systolic function. The cavity was normal. There is no increase in right ventricular wall thickness. Catheter present in the right ventricle. Left Atrium: Left atrial size was not assessed. No left atrial/left atrial appendage thrombus was detected. Right Atrium: Right atrial size was not assessed. Interatrial Septum: No atrial level shunt detected by color flow Doppler. Pericardium: There is no evidence of pericardial effusion. There is no pleural effusion. Mitral Valve: The mitral valve is normal in structure. Mitral valve regurgitation is mild by color flow Doppler. There is No evidence of mitral stenosis. Tricuspid Valve: The tricuspid valve was normal in structure.  Tricuspid valve regurgitation is mild by color flow Doppler. Aortic Valve: The aortic valve is tricuspid Aortic valve regurgitation is moderate by color flow Doppler. The jet is centrally-directed. There is moderate stenosis of the aortic valve. Pulmonic Valve: The pulmonic valve was normal in structure No evidence of pumonic stenosis. Pulmonic valve regurgitation is mild by color flow Doppler. Aorta: The aortic root, ascending aorta and aortic arch are normal in size and structure.  Roslynn Amble Electronically signed by Roslynn Amble Signature Date/Time: 06/14/2023/2:33:23 PM    Final    DG Chest Port 1 View Result Date: 06/14/2023 CLINICAL DATA:  Postop aortic valve repair and aortoplasty. EXAM: PORTABLE CHEST 1 VIEW COMPARISON:  Radiographs 05/24/2023 and 04/27/2023.  CT 04/15/2023. FINDINGS: 1337 hours. Interval median sternotomy and aortic valve replacement. Tip of the endotracheal tube is 2.8 cm above the carina. Right IJ Swan-Ganz catheter extends into the proximal right pulmonary artery. Enteric tube projects below the diaphragm. Mediastinal drains are in place. The heart size and mediastinal contours  are stable. There is mild vascular congestion and bibasilar atelectasis. Possible small left pleural effusion. No evidence of acute fracture or pneumothorax. IMPRESSION: 1. Interval median sternotomy and aortic valve replacement with support system as described. 2. Mild vascular congestion and bibasilar atelectasis. Possible small left pleural effusion. Electronically Signed   By: Carey Bullocks M.D.   On: 06/14/2023 13:56     Treatments: surgery:  CARDIOVASCULAR SURGERY OPERATIVE NOTE   06/14/2023 CHENE KASINGER [id number]   Surgeon:  Ashley Akin, MD   First Assistant: Gershon Crane [facility]                                Preoperative Diagnosis:  Aortic Stenosis   Postoperative Diagnosis:  Same     Procedure: Aortic Root replacement with a 21 mm Connect Pericardial Aortic valve conduit   Anesthesia:  General Endotracheal  Discharge Exam: Blood pressure (!) 144/60, pulse 88, temperature 98.1 F (36.7 C), temperature source Oral, resp. rate 18, height 4\' 9"  (1.448 m), weight 87.9 kg, SpO2 97%.   General appearance: alert, cooperative, and no distress Heart: regular rate and rhythm Lungs: clear to auscultation bilaterally Abdomen: benign Ext : no edema Incis healing well Discharge Medications:  The patient has been discharged on:   1.Beta Blocker:  Yes [ y  ]                              No   [   ]                              If No, reason:  2.Ace Inhibitor/ARB: Yes [  y ]                                     No  [    ]                                     If No, reason:  3.Statin:   Yes [  y ]                  No  [   ]  If No, reason:  4.Ecasa:  Yes  [  y ]                  No   [   ]                  If No, reason:  Patient had ACS upon admission:  Plavix/P2Y12 inhibitor: Yes [   ]                                      No  [   ]     Discharge Instructions     Amb Referral to Cardiac Rehabilitation   Complete by: As directed    Diagnosis:  Valve Replacement   Valve: Aortic   After initial evaluation and assessments completed: Virtual Based Care may be provided alone or in conjunction with Phase 2 Cardiac Rehab based on patient barriers.: Yes   Intensive Cardiac Rehabilitation (ICR) [facility] location only OR Traditional Cardiac Rehabilitation (TCR) *If criteria for ICR are not met will enroll in TCR [facility] only): Yes   Discharge patient   Complete by: As directed    Discharge disposition: 01-Home or Self Care   Discharge patient date: 06/24/2023      Allergies as of 06/24/2023       Reactions   Crestor [rosuvastatin] Other (See Comments)   Myalgia; patient can only tolerate taking 10 mg every other day   Robaxin [methocarbamol] Other (See Comments)   Insomnia   Toradol [ketorolac Tromethamine] Other (See Comments)   Zocor [simvastatin] Other (See Comments)   Myalgias    Lipitor [atorvastatin] Other (See Comments)   Myalgia   Sulfa Antibiotics Hives        Medication List     STOP taking these medications    carvedilol 25 MG tablet Commonly known as: COREG   celecoxib 200 MG capsule Commonly known as: CELEBREX   furosemide 20 MG tablet Commonly known as: LASIX   Hyzaar 100-25 MG tablet Generic drug: losartan-hydrochlorothiazide       TAKE these medications    acetaminophen 500 MG tablet Commonly known as: TYLENOL Take 1,000 mg by mouth every 6 (six) hours as needed for mild pain (pain score 1-3) or headache.   albuterol 108 (90 Base) MCG/ACT inhaler Commonly known as: ProAir HFA Inhale 2 puffs into the lungs every 6 (six) hours as needed.   amiodarone 200 MG tablet Commonly known as: PACERONE Take 1 tablet (200 mg total) by mouth 2 (two) times daily.   amLODipine 10 MG tablet Commonly known as: NORVASC Take 1 tablet (10 mg total) by mouth daily. What changed: when to take this   amoxicillin-clavulanate 875-125 MG tablet Commonly known as: AUGMENTIN Take 1 tablet by mouth every 12 (twelve)  hours.   ascorbic acid 500 MG tablet Commonly known as: VITAMIN C Take 500 mg by mouth daily.   aspirin 81 MG chewable tablet Chew 1 tablet (81 mg total) by mouth daily.   cetirizine 10 MG tablet Commonly known as: ZYRTEC Take 1 tablet (10 mg total) by mouth daily.   docusate sodium 100 MG capsule Commonly known as: COLACE Take 200-300 mg by mouth at bedtime.   Eliquis 5 MG Tabs tablet Generic drug: apixaban Take 1 tablet (5 mg total) by mouth 2 (two) times daily.   ezetimibe 10 MG tablet Commonly known as: ZETIA Take 1 tablet (  10 mg total) by mouth daily.   Ferrex 150 150 MG capsule Generic drug: iron polysaccharides TAKE 1 CAPSULE (150 MG DOSE) BY MOUTH TWICE A DAY What changed:  how much to take how to take this when to take this   fluticasone 50 MCG/ACT nasal spray Commonly known as: FLONASE Place 2 sprays into both nostrils daily. What changed:  when to take this reasons to take this   gabapentin 600 MG tablet Commonly known as: NEURONTIN Take 600 mg by mouth 3 (three) times daily.   losartan 100 MG tablet Commonly known as: COZAAR Take 1 tablet (100 mg total) by mouth daily.   metFORMIN 1000 MG tablet Commonly known as: GLUCOPHAGE Take 1 tablet (1,000 mg total) by mouth 2 (two) times daily with a meal.   metoprolol tartrate 25 MG tablet Commonly known as: LOPRESSOR Take 0.5 tablets (12.5 mg total) by mouth 2 (two) times daily.   oxyCODONE 5 MG immediate release tablet Commonly known as: Oxy IR/ROXICODONE Take 1 tablet (5 mg total) by mouth every 6 (six) hours as needed for up to 7 days for severe pain (pain score 7-10).   Ozempic (2 MG/DOSE) 8 MG/3ML Sopn Generic drug: Semaglutide (2 MG/DOSE) Inject 2 mg into the skin once a week.   pantoprazole 40 MG tablet Commonly known as: PROTONIX Take 40 mg by mouth daily.   polyvinyl alcohol 1.4 % ophthalmic solution Commonly known as: LIQUIFILM TEARS Place 1 drop into both eyes as needed for dry  eyes.   rosuvastatin 10 MG tablet Commonly known as: CRESTOR Take 1 tablet (10 mg total) by mouth every other day.   sulfamethoxazole-trimethoprim 400-80 MG tablet Commonly known as: BACTRIM TAKE 1 TABLET BY MOUTH TWICE DAILY               Durable Medical Equipment  (From admission, onward)           Start     Ordered   06/21/23 0959  For home use only DME 4 wheeled rolling walker with seat  Once       Question Answer Comment  Patient needs a walker to treat with the following condition Physical deconditioning   Patient needs a walker to treat with the following condition S/P AVR (aortic valve replacement)      06/21/23 1003            Follow-up Information     Eugenio Hoes, MD Follow up.   Specialty: Cardiothoracic Surgery Why: Please see discharge paperwork for details of follow-up appointment with surgeon. Contact information: [address] [address] [place] [address] [phone]         Sharlene Dory, PA-C Follow up.   Specialty: Cardiology Why: Hospital follow-up with General Cardiology scheduled for 07/04/2023 at 8:50am. Please arrive 15 minutes early for check-in. If this date/ time does not work for you, please call our office to reschedule. Contact information: [address] [place] [address] [phone]         [facility] Follow up.   Specialty: Cardiology Why: Post-op Echo scheduled for 07/26/2023 at 11:30am. Please arrive 15 minutes early for check-in. If this date/ time does not work for you, please cal our office to reschedule. Contact information: [address] [place] [address] [phone]        Julien Girt, PA-C. Go on 07/07/2023.   Specialty: Family Medicine Why: appt scheduled for Thursday, 07/07/2023 at  2 pm Contact information: [address] [place] [address] [phone]         [facility] Follow up.   Specialty: Home Health Services Why: Home Health RN and Physical Therapy-agency will call with appts Contact information: [address] [place] [address] [phone]         [facility] Follow up.   Why: (Adapt)- rollator arranged- to be delivered to room prior to discharge Contact information: [address] [place] [address] [phone]                 Signed:  Rowe Clack, PA-C  06/24/2023, 11:17 AM

## 2023-06-15 NOTE — Plan of Care (Signed)

## 2023-06-15 NOTE — Consult Note (Addendum)
ELECTROPHYSIOLOGY CONSULT NOTE    Patient ID: [name] MRN: [id number], DOB/AGE: 24-Sep-1955 68 y.o.  Admit date: 06/14/2023 Date of Consult: 06/15/2023  Primary Physician: [name], MD Primary Cardiologist: Charlton Haws, MD  Electrophysiologist: new to Dr. Jimmey Ralph    Patient Profile: [name] is a 68 y.o. female with a history of HTN, HLD, T2DM, asthma, severe AS s/p AVR and aortic root replacement who is being seen today for the evaluation of CHB s/p AVR at the request of Dr. Leafy Ro.  HPI:  [name] is a 68 y.o. female with PMH as above who presented to [facility] hospital for AVR performed 06/13/2022. During the procedure, the patient required an aortic root replacement to enlarge the annules for a 21mm valve. She also had significant calcifications. She was AV paced at the conclusion of the case.   EP has been asked to see for consideration of PPM iso complete hear block post-operatively.    She has some mild chest pain currently, denies palpiations, chest pressure, dizziness or presyncope. Has not walked around unit  Labs Potassium4.9 (01/22 0255) Magnesium  3.2* (01/22 0255) Creatinine, ser  1.40* (01/22 0255) PLT  181 (01/22 0255) HGB  10.1* (01/22 0255) WBC 14.2* (01/22 0255)  .    Past Medical History:  Diagnosis Date   Allergy    Anemia    Anxiety    Arthritis    Asthma    BMI 40.0-44.9, adult (HCC) 04/07/2014   DM type 2 (diabetes mellitus, type 2) (HCC)    GERD (gastroesophageal reflux disease)    HTN (hypertension)    Hypercholesterolemia    Osteoarthritis of left hip 04/07/2014   Reflux    Severe aortic stenosis    Spinal headache    with C-Section and with spinal fusion in 2019     Surgical History:  Past Surgical History:  Procedure Laterality Date   ASCENDING AORTIC ROOT REPLACEMENT N/A 06/14/2023   Procedure: ASCENDING AORTIC ROOT REPLACEMENT USING KONECT RESILIA AORTIC VALVE CONDUIT SIZE AND REATTACHMENT OF  RIGHT AND LEFT CORONARY;  Surgeon: Eugenio Hoes, MD;  Location: [facility] OR;  Service: Open Heart Surgery;  Laterality: N/A;   BACK SURGERY  2023   lumbar fusion, got infection, then had to re-do fusion   CESAREAN SECTION     x3   COLONOSCOPY     LAMINECTOMY  03/2021   LEFT HEART CATH AND CORONARY ANGIOGRAPHY N/A 04/06/2023   Procedure: LEFT HEART CATH AND CORONARY ANGIOGRAPHY;  Surgeon: Tonny Bollman, MD;  Location: [facility] INVASIVE CV LAB;  Service: Cardiovascular;  Laterality: N/A;   SPINAL FUSION  2019   TEE WITHOUT CARDIOVERSION N/A 06/14/2023   Procedure: TRANSESOPHAGEAL ECHOCARDIOGRAM (TEE);  Surgeon: Eugenio Hoes, MD;  Location: [facility];  Service: Open Heart Surgery;  Laterality: N/A;   TUBAL LIGATION       Medications Prior to Admission  Medication Sig Dispense Refill Last Dose/Taking   acetaminophen (TYLENOL) 500 MG tablet Take 1,000 mg by mouth every 6 (six) hours as needed for mild pain (pain score 1-3) or headache.   Past Week   albuterol (PROAIR HFA) 108 (90 BASE) MCG/ACT inhaler Inhale 2 puffs into the lungs every 6 (six) hours as needed. 1 Inhaler 12 Past Week   amLODipine (NORVASC) 10 MG tablet Take 1 tablet (10 mg total) by mouth daily. (Patient taking differently: Take 10 mg by mouth at bedtime.) 90 tablet 1 06/13/2023   ascorbic acid (VITAMIN C) 500 MG  tablet Take 500 mg by mouth daily.   06/13/2023   carvedilol (COREG) 25 MG tablet Take 1 tablet (25 mg total) by mouth 2 (two) times daily. 60 tablet 0 06/14/2023 at  5:00 AM   celecoxib (CELEBREX) 200 MG capsule Take 200 mg by mouth 2 (two) times daily.   1 Past Week   cetirizine (ZYRTEC) 10 MG tablet Take 1 tablet (10 mg total) by mouth daily. 30 tablet 0 06/14/2023 at  5:00 AM   docusate sodium (COLACE) 100 MG capsule Take 200-300 mg by mouth at bedtime.   06/13/2023   fluticasone (FLONASE) 50 MCG/ACT nasal spray Place 2 sprays into both nostrils daily. (Patient taking differently: Place 2 sprays into both nostrils daily as needed for  rhinitis.) 16 g 0 Past Month   furosemide (LASIX) 20 MG tablet Take 2 tablets (40 mg total) by mouth daily. 30 tablet 0 Past Month   gabapentin (NEURONTIN) 600 MG tablet Take 600 mg by mouth 3 (three) times daily.   06/14/2023 at  5:00 AM   iron polysaccharides (NIFEREX) 150 MG capsule TAKE 1 CAPSULE (150 MG DOSE) BY MOUTH TWICE A DAY (Patient taking differently: Take 150 mg by mouth daily.) 180 capsule 1 06/14/2023 at  5:00 AM   losartan-hydrochlorothiazide (HYZAAR) 100-25 MG tablet Take 1 tablet by mouth daily.   06/13/2023   pantoprazole (PROTONIX) 40 MG tablet Take 40 mg by mouth daily.   06/14/2023 at  5:00 AM   polyvinyl alcohol (LIQUIFILM TEARS) 1.4 % ophthalmic solution Place 1 drop into both eyes as needed for dry eyes.   Past Month   rosuvastatin (CRESTOR) 10 MG tablet Take 1 tablet (10 mg total) by mouth every other day. 45 tablet 3 Past Week   sulfamethoxazole-trimethoprim (BACTRIM) 400-80 MG tablet TAKE 1 TABLET BY MOUTH TWICE DAILY 60 tablet 1 06/14/2023 at  5:00 AM   metFORMIN (GLUCOPHAGE) 1000 MG tablet Take 1 tablet (1,000 mg total) by mouth 2 (two) times daily with a meal. (Patient taking differently: Take 1,000 mg by mouth daily.) 180 tablet 1 More than a month   Semaglutide, 2 MG/DOSE, (OZEMPIC, 2 MG/DOSE,) 8 MG/3ML SOPN Inject 2 mg into the skin once a week.   06/05/2023    Inpatient Medications:   acetaminophen  1,000 mg Oral Q6H   Or   acetaminophen (TYLENOL) oral liquid 160 mg/5 mL  1,000 mg Per Tube Q6H   aspirin EC  325 mg Oral Daily   Or   aspirin  324 mg Per Tube Daily   bisacodyl  10 mg Oral Daily   Or   bisacodyl  10 mg Rectal Daily   Chlorhexidine Gluconate Cloth  6 each Topical Daily   docusate sodium  200 mg Oral Daily   enoxaparin (LOVENOX) injection  40 mg Subcutaneous QHS   ezetimibe  10 mg Oral Daily   insulin aspart  0-24 Units Subcutaneous Q4H   metoCLOPramide (REGLAN) injection  10 mg Intravenous Q6H   mouth rinse  15 mL Mouth Rinse Q2H   [START ON  06/16/2023] pantoprazole  40 mg Oral Daily   pantoprazole (PROTONIX) IV  40 mg Intravenous QHS   rosuvastatin  10 mg Oral QODAY   sodium chloride flush  10-40 mL Intracatheter Q12H   sodium chloride flush  3 mL Intravenous Q12H    Allergies:  Allergies  Allergen Reactions   Crestor [Rosuvastatin] Other (See Comments)    Myalgia; patient can only tolerate taking 10 mg every other day  Robaxin [Methocarbamol] Other (See Comments)    Insomnia   Toradol [Ketorolac Tromethamine] Other (See Comments)   Zocor [Simvastatin] Other (See Comments)    Myalgias    Lipitor [Atorvastatin] Other (See Comments)    Myalgia   Sulfa Antibiotics Hives    Family History  Problem Relation Age of Onset   Hypertension Mother    Stroke Mother    Heart disease Father    Stroke Brother    Multiple sclerosis Brother    Multiple sclerosis Sister    Colon cancer Neg Hx    Colon polyps Neg Hx    Esophageal cancer Neg Hx    Rectal cancer Neg Hx    Stomach cancer Neg Hx      Physical Exam: Vitals:   06/15/23 0730 06/15/23 0745 06/15/23 0800 06/15/23 1100  BP: 118/69 129/71 108/66   Pulse: 80 79 93   Resp: 14 12 15    Temp: 99.1 F (37.3 C) 99.1 F (37.3 C) 99 F (37.2 C) (!) 97 F (36.1 C)  TempSrc:    Axillary  SpO2: 97% 98% 96%   Weight:      Height:        GEN- NAD, A&O x 3, dozes off during interview HEENT: Normocephalic, atraumatic Lungs- CTAB, Normal effort.  Heart- Regular rate and rhythm, No M/G/R.  GI- Soft, NT, ND.  Extremities- No clubbing, cyanosis, or edema. Sternal chest incision with bandage in place   Radiology/Studies: DG Chest Port 1 View Result Date: 06/15/2023 CLINICAL DATA:  Status post AVR. EXAM: PORTABLE CHEST 1 VIEW COMPARISON:  Chest x-ray from yesterday. FINDINGS: Interval extubation and removal of the enteric tube. Unchanged right internal jugular Swan-Ganz catheter. Unchanged mediastinal drains. Stable cardiomediastinal silhouette status post AVR. Similar low  lung volumes with central pulmonary vascular congestion, bibasilar atelectasis, and small bilateral pleural effusions. No pneumothorax. No acute osseous abnormality. IMPRESSION: 1. Interval extubation. 2. Similar low lung volumes with central pulmonary vascular congestion, bibasilar atelectasis, and small bilateral pleural effusions. Electronically Signed   By: Obie Dredge M.D.   On: 06/15/2023 09:44   ECHO INTRAOPERATIVE TEE Result Date: 06/14/2023  *INTRAOPERATIVE TRANSESOPHAGEAL REPORT *  Patient Name:   KIMBLE KOSTIUK Date of Exam: 06/14/2023 Medical Rec #:  [id number]         Height:       57.0 in Accession #:    [id number]        Weight:       198.0 lb Date of Birth:  [birth year]         BSA:          1.79 m Patient Age:    68 years          BP:           146/73 mmHg Patient Gender: F                 HR:           87 bpm. Exam Location:  Anesthesiology Transesophogeal exam was perform intraoperatively during surgical procedure. Patient was closely monitored under general anesthesia during the entirety of examination. Indications:     Aortic Stenosis i35.0 Sonographer:     Irving Burton Senior RDCS Performing Phys: [id number] Eugenio Hoes Diagnosing Phys: Roslynn Amble Complications: No known complications during this procedure. POST-OP IMPRESSIONS _ Left Ventricle: The left ventricle is unchanged from pre-bypass. has normal systolic function. Underfilled cavity. _ Right Ventricle: moderately reduced function after chest closure; epinephrine 49mcg/min added for  support. _ Aorta: there is no dissection present in the aorta. _ Left Atrial Appendage: The left atrial appendage appears unchanged from pre-bypass. _ Aortic Valve: Status post bioprosthetic aortic valve replacement. Normally functioning prosthetic valve with no visualized regurgitation. Mean transvalvular gradient of . _ Mitral Valve: No stenosis present. There is moderate regurgitation.No flow reversal in the pulmonary veins. _ Tricuspid Valve: The  tricuspid valve appears unchanged from pre-bypass. There is mild regurgitation. _ Pulmonic Valve: The pulmonic valve appears unchanged from pre-bypass. _ Interatrial Septum: The interatrial septum appears unchanged from pre-bypass. _ Pericardium: The pericardium appears unchanged from pre-bypass. PRE-OP FINDINGS  Left Ventricle: The left ventricle has normal systolic function, with an ejection fraction of 60-65%. The cavity size was normal. There is moderate left ventricular hypertrophy. Left ventricular diastolic function could not be evaluated. Right Ventricle: The right ventricle has normal systolic function. The cavity was normal. There is no increase in right ventricular wall thickness. Catheter present in the right ventricle. Left Atrium: Left atrial size was not assessed. No left atrial/left atrial appendage thrombus was detected. Right Atrium: Right atrial size was not assessed. Interatrial Septum: No atrial level shunt detected by color flow Doppler. Pericardium: There is no evidence of pericardial effusion. There is no pleural effusion. Mitral Valve: The mitral valve is normal in structure. Mitral valve regurgitation is mild by color flow Doppler. There is No evidence of mitral stenosis. Tricuspid Valve: The tricuspid valve was normal in structure. Tricuspid valve regurgitation is mild by color flow Doppler. Aortic Valve: The aortic valve is tricuspid Aortic valve regurgitation is moderate by color flow Doppler. The jet is centrally-directed. There is moderate stenosis of the aortic valve. Pulmonic Valve: The pulmonic valve was normal in structure No evidence of pumonic stenosis. Pulmonic valve regurgitation is mild by color flow Doppler. Aorta: The aortic root, ascending aorta and aortic arch are normal in size and structure.  Roslynn Amble Electronically signed by Roslynn Amble Signature Date/Time: 06/14/2023/2:33:23 PM    Final    DG Chest Port 1 View Result Date: 06/14/2023 CLINICAL DATA:  Postop aortic  valve repair and aortoplasty. EXAM: PORTABLE CHEST 1 VIEW COMPARISON:  Radiographs 05/24/2023 and 04/27/2023.  CT 04/15/2023. FINDINGS: 1337 hours. Interval median sternotomy and aortic valve replacement. Tip of the endotracheal tube is 2.8 cm above the carina. Right IJ Swan-Ganz catheter extends into the proximal right pulmonary artery. Enteric tube projects below the diaphragm. Mediastinal drains are in place. The heart size and mediastinal contours are stable. There is mild vascular congestion and bibasilar atelectasis. Possible small left pleural effusion. No evidence of acute fracture or pneumothorax. IMPRESSION: 1. Interval median sternotomy and aortic valve replacement with support system as described. 2. Mild vascular congestion and bibasilar atelectasis. Possible small left pleural effusion. Electronically Signed   By: Carey Bullocks M.D.   On: 06/14/2023 13:56   DG Chest 2 View Result Date: 05/28/2023 CLINICAL DATA:  Preop chest exam.  Severe aortic stenosis. EXAM: CHEST - 2 VIEW COMPARISON:  Radiograph 04/27/2023 FINDINGS: The heart is upper normal in size.The cardiomediastinal contours are normal. The lungs are clear. Pulmonary vasculature is normal. No consolidation, pleural effusion, or pneumothorax. No acute osseous abnormalities are seen. Lumbar fusion hardware is partially included in the field of view. Hardware is unchanged in appearance from lumbar spine radiograph 03/07/2023, with the uppermost pedicle screws extending beyond the vertebral body into the intervertebral disc space. IMPRESSION: No acute findings. Electronically Signed   By: Narda Rutherford M.D.   On:  05/28/2023 17:28   VAS US CAROTID Result Date: 05/24/2023 Carotid Arterial Duplex Study Patient Name:  MARLEAN SNOWBERGER  Date of Exam:   05/24/2023 Medical Rec #: [id number]          Accession #:    [id number] Date of Birth: May 20, 1956          Patient Gender: F Patient Age:   79 years Exam Location:  [facility]  Procedure:      VAS US CAROTID Referring Phys: Eugenio Hoes --------------------------------------------------------------------------------  Indications:      Preop for MVR. Severe aortic stenosis. Risk Factors:     Hypertension, hyperlipidemia, Diabetes, coronary artery                   disease. Comparison Study: No priors. Performing Technologist: Marilynne Halsted RDMS, RVT  Examination Guidelines: A complete evaluation includes B-mode imaging, spectral Doppler, color Doppler, and power Doppler as needed of all accessible portions of each vessel. Bilateral testing is considered an integral part of a complete examination. Limited examinations for reoccurring indications may be performed as noted.  Right Carotid Findings: +----------+--------+--------+--------+------------------+--------+           PSV cm/sEDV cm/sStenosisPlaque DescriptionComments +----------+--------+--------+--------+------------------+--------+ CCA Prox  107     21                                         +----------+--------+--------+--------+------------------+--------+ CCA Distal82      24                                         +----------+--------+--------+--------+------------------+--------+ ICA Prox  75      24      1-39%                              +----------+--------+--------+--------+------------------+--------+ ICA Distal58      22                                         +----------+--------+--------+--------+------------------+--------+ ECA       57      14                                         +----------+--------+--------+--------+------------------+--------+ +----------+--------+-------+----------------+-------------------+           PSV cm/sEDV cmsDescribe        Arm Pressure (mmHG) +----------+--------+-------+----------------+-------------------+ [id number]            Multiphasic, WNL                     +----------+--------+-------+----------------+-------------------+ +---------+--------+--+--------+--+---------+ VertebralPSV cm/s70EDV cm/s21Antegrade +---------+--------+--+--------+--+---------+  Left Carotid Findings: +----------+--------+--------+--------+------------------+--------+           PSV cm/sEDV cm/sStenosisPlaque DescriptionComments +----------+--------+--------+--------+------------------+--------+ CCA Prox  122     27                                         +----------+--------+--------+--------+------------------+--------+ CCA Distal77      25                                         +----------+--------+--------+--------+------------------+--------+  ICA Prox  73      24      1-39%                              +----------+--------+--------+--------+------------------+--------+ ICA Distal85      36                                         +----------+--------+--------+--------+------------------+--------+ ECA       55      16                                         +----------+--------+--------+--------+------------------+--------+ +----------+--------+--------+----------------+-------------------+           PSV cm/sEDV cm/sDescribe        Arm Pressure (mmHG) +----------+--------+--------+----------------+-------------------+ [id number]             Multiphasic, WNL                    +----------+--------+--------+----------------+-------------------+ +---------+--------+--+--------+--+---------+ VertebralPSV cm/s78EDV cm/s25Antegrade +---------+--------+--+--------+--+---------+   Summary:   *See table(s) above for measurements and observations.  Electronically signed by Lemar Livings MD on 05/24/2023 at 2:28:15 PM.    Final     EKG:05/03/2023 - ST at 119bpm (personally reviewed)  TELEMETRY: CHB, VP in 50-60s (personally reviewed)    Assessment/Plan: #) AS s/p AVR with aortic root replacement #) CHB Post-operative day  #1 with surgical pacing wires in place No intrinsic rhythm down to VVI 30 Weaned from vent overnight Central lines still in place EP will follow clinical course to eval for PPM. Needs to heal from valve surgery   For questions or updates, please contact [facility] Please consult [email or web address] for contact info under Cardiology/STEMI.  Signed, Sherie Don, NP  06/15/2023 1:37 PM  EP Attending  Patient seen and examined. She is a pleasant middle aged woman with CHB after undergoing AVR/aortic root replacement. On exam she is a pleasant middle aged obese woman, NAD. Lungs are clear. CV with a RRR and sternotomy is dry. Ext with no edema. Tele NSR with ventricular pacing. Underlying v paced at 30.  A/P Postop CHB - She will need PPM as it is very unlikely that her conduction will return. Plan for PPM on Thursday or Friday if her conduction does not improve.  Sharlot Gowda Kenzo Ozment,MD

## 2023-06-16 ENCOUNTER — Inpatient Hospital Stay (HOSPITAL_COMMUNITY): Payer: Medicare HMO

## 2023-06-16 DIAGNOSIS — E119 Type 2 diabetes mellitus without complications: Secondary | ICD-10-CM | POA: Diagnosis not present

## 2023-06-16 DIAGNOSIS — E785 Hyperlipidemia, unspecified: Secondary | ICD-10-CM

## 2023-06-16 DIAGNOSIS — I442 Atrioventricular block, complete: Secondary | ICD-10-CM | POA: Diagnosis not present

## 2023-06-16 DIAGNOSIS — I10 Essential (primary) hypertension: Secondary | ICD-10-CM

## 2023-06-16 DIAGNOSIS — Z952 Presence of prosthetic heart valve: Secondary | ICD-10-CM | POA: Diagnosis not present

## 2023-06-16 LAB — CBC
HCT: 30.7 % — ABNORMAL LOW (ref 36.0–46.0)
Hemoglobin: 9.6 g/dL — ABNORMAL LOW (ref 12.0–15.0)
MCH: 25.4 pg — ABNORMAL LOW (ref 26.0–34.0)
MCHC: 31.3 g/dL (ref 30.0–36.0)
MCV: 81.2 fL (ref 80.0–100.0)
Platelets: 160 10*3/uL (ref 150–400)
RBC: 3.78 MIL/uL — ABNORMAL LOW (ref 3.87–5.11)
RDW: 18.1 % — ABNORMAL HIGH (ref 11.5–15.5)
WBC: 17.7 10*3/uL — ABNORMAL HIGH (ref 4.0–10.5)
nRBC: 0 % (ref 0.0–0.2)

## 2023-06-16 LAB — BASIC METABOLIC PANEL
Anion gap: 9 (ref 5–15)
BUN: 29 mg/dL — ABNORMAL HIGH (ref 8–23)
CO2: 24 mmol/L (ref 22–32)
Calcium: 8.3 mg/dL — ABNORMAL LOW (ref 8.9–10.3)
Chloride: 103 mmol/L (ref 98–111)
Creatinine, Ser: 2.01 mg/dL — ABNORMAL HIGH (ref 0.44–1.00)
GFR, Estimated: 27 mL/min — ABNORMAL LOW (ref >60)
Glucose, Bld: 131 mg/dL — ABNORMAL HIGH (ref 70–99)
Potassium: 4.5 mmol/L (ref 3.5–5.1)
Sodium: 136 mmol/L (ref 135–145)

## 2023-06-16 LAB — GLUCOSE, CAPILLARY
Glucose-Capillary: 112 mg/dL — ABNORMAL HIGH (ref 70–99)
Glucose-Capillary: 115 mg/dL — ABNORMAL HIGH (ref 70–99)
Glucose-Capillary: 115 mg/dL — ABNORMAL HIGH (ref 70–99)
Glucose-Capillary: 124 mg/dL — ABNORMAL HIGH (ref 70–99)
Glucose-Capillary: 128 mg/dL — ABNORMAL HIGH (ref 70–99)
Glucose-Capillary: 134 mg/dL — ABNORMAL HIGH (ref 70–99)
Glucose-Capillary: 139 mg/dL — ABNORMAL HIGH (ref 70–99)

## 2023-06-16 MED ORDER — FUROSEMIDE 10 MG/ML IJ SOLN
40.0000 mg | Freq: Once | INTRAMUSCULAR | Status: AC
  Administered 2023-06-16: 40 mg via INTRAVENOUS
  Filled 2023-06-16: qty 4

## 2023-06-16 MED ORDER — INSULIN ASPART 100 UNIT/ML IJ SOLN
0.0000 [IU] | Freq: Three times a day (TID) | INTRAMUSCULAR | Status: DC
  Administered 2023-06-17 – 2023-06-20 (×4): 2 [IU] via SUBCUTANEOUS
  Administered 2023-06-20: 3 [IU] via SUBCUTANEOUS
  Administered 2023-06-21 – 2023-06-22 (×3): 2 [IU] via SUBCUTANEOUS
  Administered 2023-06-22: 3 [IU] via SUBCUTANEOUS
  Administered 2023-06-22 – 2023-06-23 (×2): 2 [IU] via SUBCUTANEOUS
  Administered 2023-06-23: 3 [IU] via SUBCUTANEOUS
  Administered 2023-06-23: 2 [IU] via SUBCUTANEOUS

## 2023-06-16 MED ORDER — ENOXAPARIN SODIUM 30 MG/0.3ML IJ SOSY
30.0000 mg | PREFILLED_SYRINGE | Freq: Every day | INTRAMUSCULAR | Status: DC
  Administered 2023-06-16: 30 mg via SUBCUTANEOUS
  Filled 2023-06-16: qty 0.3

## 2023-06-16 MED ORDER — AMLODIPINE BESYLATE 10 MG PO TABS
10.0000 mg | ORAL_TABLET | Freq: Every day | ORAL | Status: DC
  Administered 2023-06-16 – 2023-06-23 (×7): 10 mg via ORAL
  Filled 2023-06-16 (×8): qty 1

## 2023-06-16 MED ORDER — CARVEDILOL 25 MG PO TABS: 25.0000 mg | ORAL_TABLET | Freq: Two times a day (BID) | ORAL | Status: DC

## 2023-06-16 NOTE — Progress Notes (Signed)
      [address]       [place] [address]             [phone]      POD # 2 Aortic root replacement  Low O2 sat currently HR in mid 40s and low BP  DDD at 90, Bp improved  Creatinine 2.01 UP has dropped off significantly Poor response to Lasix this AM  Will give Lasix again this evening Possible pacemaker placement tomorrow  Viviann Spare C. Dorris Fetch, MD [facility] [phone]

## 2023-06-16 NOTE — Progress Notes (Addendum)
TCTS DAILY ICU PROGRESS NOTE                   [address]            [place] [address]          [phone]   2 Days Post-Op Procedure(s) (LRB): ASCENDING AORTIC ROOT REPLACEMENT USING KONECT RESILIA AORTIC VALVE CONDUIT SIZE AND REATTACHMENT OF RIGHT AND LEFT CORONARY (N/A) TRANSESOPHAGEAL ECHOCARDIOGRAM (TEE) (N/A)  Total Length of Stay:  LOS: 2 days   Subjective: Feels ok, some nausea, some cough  Objective: Vital signs in last 24 hours: Temp:  [97 F (36.1 C)-101 F (38.3 C)] 100.2 F (37.9 C) (01/23 0700) Pulse Rate:  [51-103] 72 (01/23 0700) Cardiac Rhythm: A-V Sequential paced;Heart block (01/22 2000) Resp:  [2-24] 8 (01/23 0700) BP: (96-157)/(55-95) 125/62 (01/23 0700) SpO2:  [86 %-98 %] 96 % (01/23 0700) Arterial Line BP: (104-137)/(49-68) 111/50 (01/22 1400) Weight:  [94.8 kg] 94.8 kg (01/23 0600)  Filed Weights   06/14/23 0552 06/15/23 0500 06/16/23 0600  Weight: 89.8 kg 89.8 kg 94.8 kg    Weight change: 5.047 kg   Hemodynamic parameters for last 24 hours: PAP: (43)/(23) 43/23 CVP:  [18 mmHg] 18 mmHg  Intake/Output from previous day: 01/22 0701 - 01/23 0700 In: 145.4 [I.V.:145.4] Out: 825 [Urine:635; Chest Tube:190]  Intake/Output this shift: No intake/output data recorded.  Current Meds: Scheduled Meds:  acetaminophen  1,000 mg Oral Q6H   Or   acetaminophen (TYLENOL) oral liquid 160 mg/5 mL  1,000 mg Per Tube Q6H   aspirin EC  325 mg Oral Daily   Or   aspirin  324 mg Per Tube Daily   bisacodyl  10 mg Oral Daily   Or   bisacodyl  10 mg Rectal Daily   Chlorhexidine Gluconate Cloth  6 each Topical Daily   docusate sodium  200 mg Oral Daily   enoxaparin (LOVENOX) injection  30 mg Subcutaneous QHS   ezetimibe  10 mg Oral Daily   insulin aspart  0-24 Units Subcutaneous Q4H   pantoprazole  40 mg Oral Daily   rosuvastatin  10 mg Oral QODAY   sodium chloride flush  10-40 mL Intracatheter Q12H   sodium chloride flush  3  mL Intravenous Q12H   Continuous Infusions:  albumin human Stopped (06/14/23 2001)   PRN Meds:.albumin human, dextrose, metoprolol tartrate, midazolam, ondansetron (ZOFRAN) IV, mouth rinse, oxyCODONE, sodium chloride flush, sodium chloride flush, traMADol  General appearance: alert, cooperative, and no distress Heart: regular rate and rhythm Lungs: mildly dim in bases Abdomen: obese, soft, non tender Extremities: minor edema Wound: dressing CDI  Lab Results: CBC: Recent Labs    06/15/23 1650 06/16/23 0444  WBC 18.3* 17.7*  HGB 10.2* 9.6*  HCT 32.6* 30.7*  PLT 167 160   BMET:  Recent Labs    06/15/23 1650 06/16/23 0444  NA 134* 136  K 4.5 4.5  CL 101 103  CO2 21* 24  GLUCOSE 240* 131*  BUN 22 29*  CREATININE 1.83* 2.01*  CALCIUM 8.3* 8.3*    CMET: Lab Results  Component Value Date   WBC 17.7 (H) 06/16/2023   HGB 9.6 (L) 06/16/2023   HCT 30.7 (L) 06/16/2023   PLT 160 06/16/2023   GLUCOSE 131 (H) 06/16/2023   CHOL 153 02/03/2023   TRIG 135 02/03/2023   HDL 43 02/03/2023   LDLDIRECT 106 (H) 04/27/2023   LDLCALC 86 02/03/2023   ALT 21 06/14/2023  AST 22 06/14/2023   NA 136 06/16/2023   K 4.5 06/16/2023   CL 103 06/16/2023   CREATININE 2.01 (H) 06/16/2023   BUN 29 (H) 06/16/2023   CO2 24 06/16/2023   TSH 4.110 04/28/2023   INR 1.4 (H) 06/14/2023   HGBA1C 5.9 (H) 05/24/2023   MICROALBUR 36.6 04/08/2015      PT/INR:  Recent Labs    06/14/23 1321  LABPROT 17.3*  INR 1.4*   Radiology: No results found.   Assessment/Plan: S/P Procedure(s) (LRB): ASCENDING AORTIC ROOT REPLACEMENT USING KONECT RESILIA AORTIC VALVE CONDUIT SIZE AND REATTACHMENT OF RIGHT AND LEFT CORONARY (N/A) TRANSESOPHAGEAL ECHOCARDIOGRAM (TEE) (N/A) POD#1  1 Tmax 101, PA/CVP elevated, no current gtts or inotropes, will need further diuresis but creat rising, PCCM assisting w/ICU management 2 sats ok on 1 liter [place] 3 CT 190 ml/24h- poss d/c today 4 fair UOP , 648ml/24 h  recorded, weight up about 5 KG 5 BS well controlled- cont SSI, will eventually resume glucophage 6 leukocytosis- likely reactive, will need to monitor closely w/ fever 7 stable expected ABLA- not at transfusion threshold 8 EP on board for monitoring poss PPM need for CHB 9 CXR- platelike atx  right base 10 lovenox for DVT PPX 11 routine rehab and pulm hygiene   Rowe Clack PA-C  pager [phone] 06/16/2023 7:24 AM  Agree with above Will remove chest tubes and foley Continue diuresis Appreciate EP, and PCCM  Dilara Navarrete O Addelyn Alleman

## 2023-06-16 NOTE — Progress Notes (Signed)
NAME:  [name], MRN:  [id number], DOB:  July 31, 1955, LOS: 2 ADMISSION DATE:  06/14/2023, CONSULTATION DATE:  06/14/2023 REFERRING MD:  Tereso Newcomer, CHIEF COMPLAINT:  post-operative management   History of Present Illness:  68 year old female with past medical history of asthma, T2DM, HTN, HLD, severe aortic stenosis who presents for aortic valve replacement, possible aortic root replacement.  Patient had recent admission to [facility] 04/27/23-04/30/23 for progressive dyspnea requiring 4LNC. Appeared to be FVO. She was diuresed and eventually weaned off nitroglycerin gtt for concomitant hypertensive emergency. She had echo which showed EF 60-65%, no RWMA, G2DD, moderate AVR, severe AV stenosis. She had follow-up with outpatient cardiology and TCTS.   Intraoperatively, received AVR and root replacement. To ICU from OR on Epi, NE, dex, propofol, insulin. Getting albumin. X1 CT.   Pump time: 2h 5m Xclamp time: 2h 81m EBL: 476cc Cell saver: 230cc 2UPRBC  Pertinent  Medical History  T2DM, HTN, HFpEF, HLD, asthma, aortic stenosis   Significant Hospital Events: Including procedures, antibiotic start and stop dates in addition to other pertinent events   1/21: s/p AVR, aortic root replacement to ICU from OR post-operatively  1/22: Lasix 40mg  IV. NAEON. Extubated yesterday   Interim History / Subjective:  NAEON. Remains pacer dependent. EP planning for pacer possibly Friday 1/24. Last documented CVP 26 from last night, unsure accuracy. Weight up 9lbs from admit. Had 635 UOP after 40mg  Lasix yesterday. SCr bump from 1.83 to 2.01.  Objective   Blood pressure 125/62, pulse 72, temperature 100.2 F (37.9 C), resp. rate (!) 8, height 4\' 9"  (1.448 m), weight 94.8 kg, SpO2 96%. PAP: (43)/(23) 43/23 CVP:  [18 mmHg] 18 mmHg      Intake/Output Summary (Last 24 hours) at 06/16/2023 0731 Last data filed at 06/16/2023 0600 Gross per 24 hour  Intake 145.38 ml  Output 825 ml  Net -679.62 ml    Filed Weights   06/14/23 0552 06/15/23 0500 06/16/23 0600  Weight: 89.8 kg 89.8 kg 94.8 kg    Examination: General: middle aged female, sitting in recliner, no acute distress  HENT: [place]/AT, anicteric sclera, nasal cannula 2L, MMM Lungs: clear bilaterally, nasal cannula 2L, no respiratory distress  Cardiovascular: paced, underlying junctional rhythm, trace edema Abdomen: rounded, soft, non-distended  Extremities: trace edema, warm Neuro: tired, but awake, alert and oriented, non focal exam GU: foley   Resolved Hospital Problem list   Post-operative vent management  Post bypass vasoplegia   Assessment & Plan:  Severe aortic stenosis s/p AVR Severe aortic root calcification s/p aortic root replacement  Expected post-operative ABLA Expected post-operative consumptive thrombocytopenia  Severe AS with moderate AVR now s/p replacement. Intraoperatively found to have significant aortic root calcifications and proceeded with root replacement.  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 - EP following, planning for PPM potentially Fri 1/24  Diabetes  - SSI  Hypertension  - Monitor - Avoid all AVNB's  Hyperlipidemia  -restart statin per TCTS    Best Practice (right click and "Reselect all SmartList Selections" daily)   Diet/type: Regular consistency (see orders) DVT prophylaxis: LMWH Pressure ulcer(s): none GI prophylaxis: PPI Lines: Central line and yes and it is  still needed Foley:  Yes, and it is still needed Code Status:  full code Last date of multidisciplinary goals of care discussion [per primary]  Critical  care time: 30 min    Taha Dimond Celine Mans, [place] [facility] For pager details, please see AMION or use Epic chat  After 1900, please call [facility] for cross coverage needs 06/16/2023, 7:40 AM

## 2023-06-16 NOTE — Progress Notes (Addendum)
  Patient Name: [name] Date of Encounter: 06/16/2023  Primary Cardiologist: Charlton Haws, MD Electrophysiologist: None  Interval Summary   NAEON. Sitting up in chair  At this time, the patient denies chest pain, shortness of breath, or any new concerns.  Vital Signs    Vitals:   06/16/23 0500 06/16/23 0600 06/16/23 0610 06/16/23 0700  BP: (!) 141/77  117/65 125/62  Pulse: (!) 57  (!) 55 72  Resp: 14  (!) 2 (!) 8  Temp: 99.5 F (37.5 C)  100.2 F (37.9 C) 100.2 F (37.9 C)  TempSrc:      SpO2: 96%  92% 96%  Weight:  94.8 kg    Height:        Intake/Output Summary (Last 24 hours) at 06/16/2023 0752 Last data filed at 06/16/2023 0600 Gross per 24 hour  Intake 145.38 ml  Output 825 ml  Net -679.62 ml   Filed Weights   06/14/23 0552 06/15/23 0500 06/16/23 0600  Weight: 89.8 kg 89.8 kg 94.8 kg    Physical Exam    GEN- The patient is well appearing, alert and oriented x 3 today.   Lungs- Clear to ausculation bilaterally, normal work of breathing Cardiac- Irregularly irregular rate and rhythm, no murmurs, rubs or gallops GI- soft, NT, ND, + BS Extremities- no clubbing or cyanosis. No edema  Telemetry    CHB with intermittent pacing (personally reviewed)  Hospital Course    [name] is a 68 y.o. female with a history of HTN, HLD, T2DM, asthma, severe AS s/p AVR and aortic root replacement (POD #2). EP has been asked to see for CHB post-op    Assessment & Plan    #) AS s/p AVR with aortic root replacement #) CHB Post-operative day #2 with surgical pacing wires in place Intermittently tracking atrial rate Febrile yesterday Central lines remain in place EP [name] follow clinical course to eval for PPM. Needs to heal from valve surgery, remain afebrile NPO at midnight for possible PPM tomorrow       For questions or updates, please contact [facility] Please consult [email or web address] for contact info under Cardiology/STEMI.  Signed, Olivia Don, NP  06/16/2023, 7:52 AM   I have seen and examined this patient with [name].  Agree with above, note added to reflect my findings.  Postop day 2 from Bentall procedure.  No acute complaints.  Sitting up in a chair.  GEN: Well nourished, well developed, in no acute distress  HEENT: normal  Neck: no JVD, carotid bruits, or masses Cardiac: RRR; no murmurs, rubs, or gallops,no edema  Respiratory:  clear to auscultation bilaterally, normal work of breathing GI: soft, nontender, nondistended, + BS MS: no deformity or atrophy  Skin: warm and dry,  Neuro:  Strength and sensation are intact Psych: euthymic mood, full affect   Complete heart block: Patient is post Bentall procedure.  She is postop day 2.  She is having intermittent 2-1 AV block, as well as ventricular pacing as sensed by her temporary wires.  For now, would hold off on pacemaker implant.  As she has had improved conduction, would continue with temporary wire back up.  [name] M. Olivia Ryser MD 06/16/2023 9:41 AM

## 2023-06-17 ENCOUNTER — Other Ambulatory Visit: Payer: Self-pay | Admitting: Student

## 2023-06-17 DIAGNOSIS — I350 Nonrheumatic aortic (valve) stenosis: Secondary | ICD-10-CM

## 2023-06-17 DIAGNOSIS — R739 Hyperglycemia, unspecified: Secondary | ICD-10-CM

## 2023-06-17 DIAGNOSIS — I442 Atrioventricular block, complete: Secondary | ICD-10-CM | POA: Diagnosis not present

## 2023-06-17 DIAGNOSIS — R509 Fever, unspecified: Secondary | ICD-10-CM | POA: Diagnosis not present

## 2023-06-17 DIAGNOSIS — Z952 Presence of prosthetic heart valve: Secondary | ICD-10-CM

## 2023-06-17 LAB — CBC
HCT: 29 % — ABNORMAL LOW (ref 36.0–46.0)
Hemoglobin: 9 g/dL — ABNORMAL LOW (ref 12.0–15.0)
MCH: 24.7 pg — ABNORMAL LOW (ref 26.0–34.0)
MCHC: 31 g/dL (ref 30.0–36.0)
MCV: 79.7 fL — ABNORMAL LOW (ref 80.0–100.0)
Platelets: 171 10*3/uL (ref 150–400)
RBC: 3.64 MIL/uL — ABNORMAL LOW (ref 3.87–5.11)
RDW: 17.8 % — ABNORMAL HIGH (ref 11.5–15.5)
WBC: 15.9 10*3/uL — ABNORMAL HIGH (ref 4.0–10.5)
nRBC: 0.3 % — ABNORMAL HIGH (ref 0.0–0.2)

## 2023-06-17 LAB — BASIC METABOLIC PANEL
Anion gap: 11 (ref 5–15)
BUN: 38 mg/dL — ABNORMAL HIGH (ref 8–23)
CO2: 25 mmol/L (ref 22–32)
Calcium: 8.5 mg/dL — ABNORMAL LOW (ref 8.9–10.3)
Chloride: 104 mmol/L (ref 98–111)
Creatinine, Ser: 1.83 mg/dL — ABNORMAL HIGH (ref 0.44–1.00)
GFR, Estimated: 30 mL/min — ABNORMAL LOW (ref >60)
Glucose, Bld: 132 mg/dL — ABNORMAL HIGH (ref 70–99)
Potassium: 3.9 mmol/L (ref 3.5–5.1)
Sodium: 140 mmol/L (ref 135–145)

## 2023-06-17 LAB — PHOSPHORUS: Phosphorus: 3.7 mg/dL (ref 2.5–4.6)

## 2023-06-17 LAB — GLUCOSE, CAPILLARY
Glucose-Capillary: 124 mg/dL — ABNORMAL HIGH (ref 70–99)
Glucose-Capillary: 106 mg/dL — ABNORMAL HIGH (ref 70–99)
Glucose-Capillary: 105 mg/dL — ABNORMAL HIGH (ref 70–99)
Glucose-Capillary: 97 mg/dL (ref 70–99)

## 2023-06-17 LAB — MAGNESIUM: Magnesium: 2.3 mg/dL (ref 1.7–2.4)

## 2023-06-17 MED ORDER — FUROSEMIDE 10 MG/ML IJ SOLN
40.0000 mg | Freq: Once | INTRAMUSCULAR | Status: AC
  Administered 2023-06-17: 40 mg via INTRAVENOUS
  Filled 2023-06-17: qty 4

## 2023-06-17 MED ORDER — ENOXAPARIN SODIUM 40 MG/0.4ML IJ SOSY
40.0000 mg | PREFILLED_SYRINGE | Freq: Every day | INTRAMUSCULAR | Status: DC
  Administered 2023-06-17 – 2023-06-18 (×2): 40 mg via SUBCUTANEOUS
  Filled 2023-06-17 (×2): qty 0.4

## 2023-06-17 MED ORDER — POTASSIUM CHLORIDE CRYS ER 20 MEQ PO TBCR
20.0000 meq | EXTENDED_RELEASE_TABLET | Freq: Once | ORAL | Status: AC
  Administered 2023-06-17: 20 meq via ORAL
  Filled 2023-06-17: qty 1

## 2023-06-17 MED FILL — Potassium Chloride Inj 2 mEq/ML: INTRAVENOUS | Qty: 40 | Status: AC

## 2023-06-17 MED FILL — Heparin Sodium (Porcine) Inj 1000 Unit/ML: Qty: 1000 | Status: AC

## 2023-06-17 MED FILL — Lidocaine HCl Local Preservative Free (PF) Inj 2%: INTRAMUSCULAR | Qty: 14 | Status: AC

## 2023-06-17 NOTE — TOC Initial Note (Addendum)
Transition of Care [facility]) - Initial/Assessment Note    Patient Details  Name: [name] MRN: [id number] Date of Birth: 1956/05/16  Transition of Care [facility]) CM/SW Contact:    Elliot Cousin, RN Phone Number: [phone] 06/17/2023, 12:44 PM  Clinical Narrative:                 CM spoke to pt and states she was independent pta. Home with spouse. Pt reports having cane, and RW at home. Offered choice for HH, medicare.gov list placed on chart and provided to patient.  Will continue to follow for dc needs.   Expected Discharge Plan: Home w Home Health Services Barriers to Discharge: Continued Medical Work up   Patient Goals and CMS Choice Patient states their goals for this hospitalization and ongoing recovery are:: wants to recover          Expected Discharge Plan and Services   Discharge Planning Services: CM Consult   Living arrangements for the past 2 months: Single Family Home                                      Prior Living Arrangements/Services Living arrangements for the past 2 months: Single Family Home Lives with:: Spouse, Adult Children Patient language and need for interpreter reviewed:: Yes        Need for Family Participation in Patient Care: Yes (Comment) Care giver support system in place?: Yes (comment)   Criminal Activity/Legal Involvement Pertinent to Current Situation/Hospitalization: No - Comment as needed  Activities of Daily Living      Permission Sought/Granted Permission sought to share information with : Case Manager, Family Supports, PCP Permission granted to share information with : Yes, Verbal Permission Granted  Share Information with NAME: Janei Scheff     Permission granted to share info w Relationship: husband  Permission granted to share info w Contact Information: [phone]  Emotional Assessment Appearance:: Appears stated age Attitude/Demeanor/Rapport: Lethargic Affect (typically observed):  Accepting Orientation: : Oriented to Self, Oriented to  Time, Oriented to Place, Oriented to Situation   Psych Involvement: No (comment)  Admission diagnosis:  S/P AVR (aortic valve replacement) and aortoplasty [Z95.2] Patient Active Problem List   Diagnosis Date Noted   S/P AVR (aortic valve replacement) and aortoplasty 06/14/2023   Acute CHF (congestive heart failure) (HCC) 04/28/2023   Acute exacerbation of CHF (congestive heart failure) (HCC) 04/27/2023   Severe aortic stenosis    Stage 3b chronic kidney disease (HCC) 01/25/2023   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   Microcytic anemia 01/25/2023   Chronic kidney disease, stage 3a (HCC) 07/08/2022   Class 2 severe obesity due to excess calories with serious comorbidity and body mass index (BMI) of 38.0 to 38.9 in adult (HCC) 07/08/2022   Primary osteoarthritis of right knee 07/08/2022   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   Bacteremia due to Proteus species    Sepsis (HCC) 02/03/2022   Iron deficiency anemia 02/03/2022   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   S/P lumbar spinal fusion 11/17/2021   Lumbar radiculopathy 04/21/2021   Type 2 diabetes mellitus without complication, without long-term current use of insulin (HCC) 12/22/2018   Hypertriglyceridemia 10/19/2018   Spondylolisthesis of lumbar region 02/01/2018   Gastroesophageal reflux disease without esophagitis 01/16/2018   Skin inflammation 08/25/2017   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 [place] [address] Phone: [phone] Fax: [phone]     Social Drivers of Health (SDOH) Social History: SDOH Screenings   Food Insecurity: No Food Insecurity (04/28/2023)  Housing: Low Risk  (04/28/2023)  Transportation Needs: No Transportation Needs (04/28/2023)  Utilities: Not At Risk (04/28/2023)  Depression (PHQ2-9): Low Risk  (04/26/2023)  Financial Resource Strain: Low Risk  (04/13/2023)   Received from [facility]  Physical Activity: Inactive (04/13/2023)   Received from [facility]  Social Connections: Somewhat Isolated (04/13/2023)   Received from [facility]  Stress: No Stress Concern Present (04/13/2023)   Received from [facility]  Tobacco Use: Low Risk  (05/24/2023)   SDOH Interventions:     Readmission Risk Interventions     No data to display

## 2023-06-17 NOTE — Progress Notes (Addendum)
Rounding Note    Patient Name: [name] Date of Encounter: 06/17/2023  [facility] Cardiologist: Charlton Haws, MD   Subjective   OOB, reports chest wall discomfort, back is sore, no SOB, or CP otherwise  Inpatient Medications    Scheduled Meds:  acetaminophen  1,000 mg Oral Q6H   Or   acetaminophen (TYLENOL) oral liquid 160 mg/5 mL  1,000 mg Per Tube Q6H   amLODipine  10 mg Oral QHS   aspirin EC  325 mg Oral Daily   Or   aspirin  324 mg Per Tube Daily   bisacodyl  10 mg Oral Daily   Or   bisacodyl  10 mg Rectal Daily   Chlorhexidine Gluconate Cloth  6 each Topical Daily   docusate sodium  200 mg Oral Daily   enoxaparin (LOVENOX) injection  40 mg Subcutaneous QHS   ezetimibe  10 mg Oral Daily   insulin aspart  0-15 Units Subcutaneous TID WC   potassium chloride  20 mEq Oral Once   rosuvastatin  10 mg Oral QODAY   sodium chloride flush  10-40 mL Intracatheter Q12H   sodium chloride flush  3 mL Intravenous Q12H   Continuous Infusions:  albumin human Stopped (06/14/23 2001)   PRN Meds: albumin human, dextrose, midazolam, ondansetron (ZOFRAN) IV, mouth rinse, oxyCODONE, sodium chloride flush, sodium chloride flush, traMADol   Vital Signs    Vitals:   06/17/23 0300 06/17/23 0400 06/17/23 0500 06/17/23 0600  BP: (!) 119/59 (!) 126/53 130/77   Pulse: 79 79 79 80  Resp: 11 (!) 28 12 15   Temp: 99.5 F (37.5 C) 99.7 F (37.6 C) 99.9 F (37.7 C)   TempSrc:  Bladder    SpO2: 93% 92% 95% 93%  Weight:   93.2 kg   Height:        Intake/Output Summary (Last 24 hours) at 06/17/2023 0827 Last data filed at 06/17/2023 0600 Gross per 24 hour  Intake 168 ml  Output 2235 ml  Net -2067 ml      06/17/2023    5:00 AM 06/16/2023    6:00 AM 06/15/2023    5:00 AM  Last 3 Weights  Weight (lbs) 205 lb 6.4 oz 209 lb 1.6 oz 197 lb 15.6 oz  Weight (kg) 93.169 kg 94.847 kg 89.8 kg      Telemetry    V paced, CHB underlying remains has an escape at about 30 -  Personally Reviewed  ECG    No new EKGs - Personally Reviewed  Physical Exam   GEN: No acute distress.   Neck: No JVD Cardiac: RRR (paced), no murmurs, rubs, or gallops.  Respiratory: decreased at the bases. GI: Soft, nontender, non-distended  MS: No edema; No deformity. Neuro:  Nonfocal  Psych: Normal affect   Labs    High Sensitivity Troponin:  No results for input(s): "TROPONINIHS" in the last 720 hours.   Chemistry Recent Labs  Lab 06/14/23 0626 06/14/23 0804 06/15/23 0255 06/15/23 1650 06/16/23 0444 06/17/23 0450  NA 141   < > 137 134* 136 140  K 3.8   < > 4.9 4.5 4.5 3.9  CL 106   < > 105 101 103 104  CO2 27   < > 23 21* 24 25  GLUCOSE 115*   < > 137* 240* 131* 132*  BUN 16   < > 18 22 29* 38*  CREATININE 1.34*   < > 1.40* 1.83* 2.01* 1.83*  CALCIUM 9.5   < >  8.4* 8.3* 8.3* 8.5*  MG  --    < > 3.2* 2.8*  --  2.3  PROT 7.0  --   --   --   --   --   ALBUMIN 3.8  --   --   --   --   --   AST 22  --   --   --   --   --   ALT 21  --   --   --   --   --   ALKPHOS 69  --   --   --   --   --   BILITOT 0.4  --   --   --   --   --   GFRNONAA 44*   < > 41* 30* 27* 30*  ANIONGAP 8   < > 9 12 9 11    < > = values in this interval not displayed.    Lipids No results for input(s): "CHOL", "TRIG", "HDL", "LABVLDL", "LDLCALC", "CHOLHDL" in the last 168 hours.  Hematology Recent Labs  Lab 06/15/23 1650 06/16/23 0444 06/17/23 0450  WBC 18.3* 17.7* 15.9*  RBC 4.03 3.78* 3.64*  HGB 10.2* 9.6* 9.0*  HCT 32.6* 30.7* 29.0*  MCV 80.9 81.2 79.7*  MCH 25.3* 25.4* 24.7*  MCHC 31.3 31.3 31.0  RDW 18.1* 18.1* 17.8*  PLT 167 160 171   Thyroid No results for input(s): "TSH", "FREET4" in the last 168 hours.  BNPNo results for input(s): "BNP", "PROBNP" in the last 168 hours.  DDimer No results for input(s): "DDIMER" in the last 168 hours.   Radiology    DG Chest Port 1 View Result Date: 06/16/2023 CLINICAL DATA:  S/P AVR [id number] EXAM: PORTABLE CHEST 1 VIEW COMPARISON:   06/15/2023 FINDINGS: Pulmonary arterial catheter has been removed. Right jugular central line is still present. Again noted are chest tubes. Persistent subsegmental atelectasis in the right lung has minimally changed. Low lung volumes are similar. Negative for a pneumothorax. Heart size remains enlarged with postoperative changes. Vascular crowding to the low lung volumes without overt pulmonary edema. Retrocardiac densities probably represent atelectasis. IMPRESSION: 1. Removal of the pulmonary arterial catheter. 2. Chest tubes without pneumothorax. 3. Low lung volumes with subsegmental atelectasis in the right lung. Probable atelectasis or consolidation in the retrocardiac region. Electronically Signed   By: Richarda Overlie M.D.   On: 06/16/2023 07:54    Cardiac Studies    06/14/23: inter-op TEE POST-OP IMPRESSIONS  _ Left Ventricle: The left ventricle is unchanged from pre-bypass. has  normal  systolic function. Underfilled cavity.  _ Right Ventricle: moderately reduced function after chest closure;  epinephrine  37mcg/min added for support.  _ Aorta: there is no dissection present in the aorta.  _ Left Atrial Appendage: The left atrial appendage appears unchanged from  pre-bypass.  _ Aortic Valve: Status post bioprosthetic aortic valve replacement.  Normally  functioning prosthetic valve with no visualized regurgitation. Mean  transvalvular gradient of .  _ Mitral Valve: No stenosis present. There is moderate regurgitation.No  flow  reversal in the pulmonary veins.  _ Tricuspid Valve: The tricuspid valve appears unchanged from pre-bypass.  There  is mild regurgitation.  _ Pulmonic Valve: The pulmonic valve appears unchanged from pre-bypass.  _ Interatrial Septum: The interatrial septum appears unchanged from  pre-bypass.  _ Pericardium: The pericardium appears unchanged from pre-bypass.    Patient Profile     68 y.o. female w/PMHx of  HTN, HLD, T2DM, asthma, admitted for her  severe AS >>>  AVR and aortic root replacement (coronary re-implant)  Assessment & Plan    Post op CHB Today is POD #3 Aortic Root replacement with a 21 mm Connect Pericardial Aortic valve conduit   Remains in CHB 1/22 was febrile to 101 1/23 intermittently through the day 100 Leukocytosis trending down 18 > 17 > 15.9 today Likely this is all reactive, though would prefer to give her the weekend to monitor this and her conduction for return  Dr. Elberta Fortis has seen the patient this morning   For questions or updates, please contact [facility] Please consult [email or web address] for contact info under        Signed, Sheilah Pigeon, PA-C  06/17/2023, 8:27 AM    I have seen and examined this patient with Francis Dowse.  Agree with above, note added to reflect my findings.  Patient has some chest discomfort, but otherwise no acute complaints  GEN: Well nourished, well developed, in no acute distress  HEENT: normal  Neck: no JVD, carotid bruits, or masses Cardiac: RRR; no murmurs, rubs, or gallops,no edema  Respiratory:  clear to auscultation bilaterally, normal work of breathing GI: soft, nontender, nondistended, + BS MS: no deformity or atrophy  Skin: warm and dry Neuro:  Strength and sensation are intact Psych: euthymic mood, full affect   Complete heart block: Post Bentall procedure.  She is postop day 3.  Continuing to require epicardial wires.  Had fevers yesterday, white count trending down.  She would likely need pacemaker implant, but Jann Ra monitor for improved conduction through the weekend.  She did appear to be conducting yesterday morning.  If conduction does not return, we [name] plan for pacemaker implant on Monday. AVR and root replacement: Plan per primary team  Lyndzee Kliebert M. Shykeria Sakamoto MD 06/17/2023 9:12 AM

## 2023-06-17 NOTE — Plan of Care (Signed)
  Problem: Tissue Perfusion: Goal: Adequacy of tissue perfusion will improve Outcome: Progressing   Problem: Skin Integrity: Goal: Risk for impaired skin integrity will decrease Outcome: Progressing   Problem: Metabolic: Goal: Ability to maintain appropriate glucose levels will improve Outcome: Progressing   Problem: Fluid Volume: Goal: Ability to maintain a balanced intake and output will improve Outcome: Progressing   Problem: Coping: Goal: Ability to adjust to condition or change in health will improve Outcome: Progressing

## 2023-06-17 NOTE — Progress Notes (Signed)
NAME:  [name], MRN:  [id number], DOB:  02-Mar-1956, LOS: 3 ADMISSION DATE:  06/14/2023, CONSULTATION DATE:  06/14/2023 REFERRING MD:  Tereso Newcomer, CHIEF COMPLAINT:  post-operative management   History of Present Illness:  68 year old female with past medical history of asthma, T2DM, HTN, HLD, severe aortic stenosis who presents for aortic valve replacement, possible aortic root replacement.  Patient had recent admission to [facility] 04/27/23-04/30/23 for progressive dyspnea requiring 4LNC. Appeared to be FVO. She was diuresed and eventually weaned off nitroglycerin gtt for concomitant hypertensive emergency. She had echo which showed EF 60-65%, no RWMA, G2DD, moderate AVR, severe AV stenosis. She had follow-up with outpatient cardiology and TCTS.   Intraoperatively, received AVR and root replacement. To ICU from OR on Epi, NE, dex, propofol, insulin. Getting albumin. X1 CT.   Pump time: 2h 33m Xclamp time: 2h 60m EBL: 476cc Cell saver: 230cc 2UPRBC  Pertinent  Medical History  T2DM, HTN, HFpEF, HLD, asthma, aortic stenosis   Significant Hospital Events: Including procedures, antibiotic start and stop dates in addition to other pertinent events   1/21: s/p AVR, aortic root replacement to ICU from OR post-operatively  1/22: Lasix 40mg  IV. NAEON. Extubated yesterday   Interim History / Subjective:  NAEON. UOP 2.2L yesterday, weight up 5lbs since admit. SCr slightly improved. Temp 100.4. Still being AV paced.  Objective   Blood pressure 130/77, pulse 80, temperature 99.9 F (37.7 C), resp. rate 15, height 4\' 9"  (1.448 m), weight 93.2 kg, SpO2 93%.        Intake/Output Summary (Last 24 hours) at 06/17/2023 0709 Last data filed at 06/17/2023 0600 Gross per 24 hour  Intake 168 ml  Output 2235 ml  Net -2067 ml   Filed Weights   06/15/23 0500 06/16/23 0600 06/17/23 0500  Weight: 89.8 kg 94.8 kg 93.2 kg    Examination:  General: middle aged female, sitting in recliner, no  acute distress  HENT: [place]/AT, anicteric sclera, nasal cannula 2L, MMM Lungs: clear bilaterally, nasal cannula 2L, no respiratory distress  Cardiovascular: paced rhythm. Soft murmur. Abdomen: rounded, soft, non-distended  Extremities: trace edema, warm Neuro: tired, but awake, alert and oriented, non focal exam GU: foley   Resolved Hospital Problem list   Post-operative vent management  Post bypass vasoplegia   Assessment & Plan:  Severe aortic stenosis s/p AVR Severe aortic root calcification s/p aortic root replacement  Expected post-operative ABLA Expected post-operative consumptive thrombocytopenia  Severe AS with moderate AVR now s/p replacement. Intraoperatively found to have significant aortic root calcifications and proceeded with root replacement.  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 - EP following for PPM timing, currently no definitive date/time.  Diabetes  - SSI  Hypertension  - Monitor - D/c Carvedilol. Avoid all AVNB's  Hyperlipidemia  -Continue statin   Best Practice (right click and "Reselect all SmartList Selections" daily)   Diet/type: Regular consistency (see orders) DVT prophylaxis: LMWH Pressure ulcer(s): none GI prophylaxis: PPI Lines: Central line and yes and it is still needed Foley:  Yes, and it is still needed Code Status:  full code Last date of multidisciplinary goals of care discussion [per primary]  Critical care  time: 30 min    Rutherford Guys, [place] [facility] For pager details, please see AMION or use Epic chat  After 1900, please call ELINK  for cross coverage needs 06/17/2023, 7:09 AM

## 2023-06-17 NOTE — Progress Notes (Signed)
Ordered 6 week post-op Echo following aortic root replacement with AVR per protocol.   Primary Cardiologist - Dr. Eden Emms CT Surgeon - Dr. Debarah Crape, PA-C 06/17/2023 2:31 PM

## 2023-06-17 NOTE — Progress Notes (Addendum)
3 Days Post-Op Procedure(s) (LRB): ASCENDING AORTIC ROOT REPLACEMENT USING KONECT RESILIA AORTIC VALVE CONDUIT SIZE AND REATTACHMENT OF RIGHT AND LEFT CORONARY (N/A) TRANSESOPHAGEAL ECHOCARDIOGRAM (TEE) (N/A) Subjective: Feels better, mild nausea, some sputum production  Objective: Vital signs in last 24 hours: Temp:  [96.9 F (36.1 C)-100.2 F (37.9 C)] 99.9 F (37.7 C) (01/24 0500) Pulse Rate:  [46-107] 80 (01/24 0600) Cardiac Rhythm: A-V Sequential paced (01/23 2000) Resp:  [7-38] 15 (01/24 0600) BP: (107-157)/(47-127) 130/77 (01/24 0500) SpO2:  [90 %-100 %] 93 % (01/24 0600) Weight:  [93.2 kg] 93.2 kg (01/24 0500)  Hemodynamic parameters for last 24 hours:    Intake/Output from previous day: 01/23 0701 - 01/24 0700 In: 168 [P.O.:168] Out: 2235 [Urine:2235] Intake/Output this shift: No intake/output data recorded.  General appearance: alert, cooperative, and no distress Heart: regular rate and rhythm Lungs: mild crackles in bases Abdomen: benign Extremities: minor edema Wound: dressing intact  Lab Results: Recent Labs    06/16/23 0444 06/17/23 0450  WBC 17.7* 15.9*  HGB 9.6* 9.0*  HCT 30.7* 29.0*  PLT 160 171   BMET:  Recent Labs    06/16/23 0444 06/17/23 0450  NA 136 140  K 4.5 3.9  CL 103 104  CO2 24 25  GLUCOSE 131* 132*  BUN 29* 38*  CREATININE 2.01* 1.83*  CALCIUM 8.3* 8.5*    PT/INR:  Recent Labs    06/14/23 1321  LABPROT 17.3*  INR 1.4*   ABG    Component Value Date/Time   PHART 7.313 (L) 06/14/2023 1958   HCO3 26.5 06/14/2023 1958   TCO2 28 06/14/2023 1958   ACIDBASEDEF 3.0 (H) 06/14/2023 1813   O2SAT 98 06/14/2023 1958   CBG (last 3)  Recent Labs    06/16/23 1947 06/16/23 2204 06/17/23 0622  GLUCAP 115* 115* 124*    Meds Scheduled Meds:  acetaminophen  1,000 mg Oral Q6H   Or   acetaminophen (TYLENOL) oral liquid 160 mg/5 mL  1,000 mg Per Tube Q6H   amLODipine  10 mg Oral QHS   aspirin EC  325 mg Oral Daily    Or   aspirin  324 mg Per Tube Daily   bisacodyl  10 mg Oral Daily   Or   bisacodyl  10 mg Rectal Daily   carvedilol  25 mg Oral BID WC   Chlorhexidine Gluconate Cloth  6 each Topical Daily   docusate sodium  200 mg Oral Daily   enoxaparin (LOVENOX) injection  40 mg Subcutaneous QHS   ezetimibe  10 mg Oral Daily   insulin aspart  0-15 Units Subcutaneous TID WC   potassium chloride  20 mEq Oral Once   rosuvastatin  10 mg Oral QODAY   sodium chloride flush  10-40 mL Intracatheter Q12H   sodium chloride flush  3 mL Intravenous Q12H   Continuous Infusions:  albumin human Stopped (06/14/23 2001)   PRN Meds:.albumin human, dextrose, midazolam, ondansetron (ZOFRAN) IV, mouth rinse, oxyCODONE, sodium chloride flush, sodium chloride flush, traMADol  Xrays DG Chest Port 1 View Result Date: 06/16/2023 CLINICAL DATA:  S/P AVR [id number] EXAM: PORTABLE CHEST 1 VIEW COMPARISON:  06/15/2023 FINDINGS: Pulmonary arterial catheter has been removed. Right jugular central line is still present. Again noted are chest tubes. Persistent subsegmental atelectasis in the right lung has minimally changed. Low lung volumes are similar. Negative for a pneumothorax. Heart size remains enlarged with postoperative changes. Vascular crowding to the low lung volumes without overt pulmonary edema. Retrocardiac densities probably  represent atelectasis. IMPRESSION: 1. Removal of the pulmonary arterial catheter. 2. Chest tubes without pneumothorax. 3. Low lung volumes with subsegmental atelectasis in the right lung. Probable atelectasis or consolidation in the retrocardiac region. Electronically Signed   By: Richarda Overlie M.D.   On: 06/16/2023 07:54    Assessment/Plan: S/P Procedure(s) (LRB): ASCENDING AORTIC ROOT REPLACEMENT USING KONECT RESILIA AORTIC VALVE CONDUIT SIZE AND REATTACHMENT OF RIGHT AND LEFT CORONARY (N/A) TRANSESOPHAGEAL ECHOCARDIOGRAM (TEE) (N/A) POD#3  1 Tmax 100.2, S BP 107-157, AV seq paced, HR  40's-100's, CHB 2 O2 sats good on RA 3 good UOP, Creat improved trend, weight trending lower, will need further diuresis 4 MG++/K+ ok  5 leukocytosis trend improved 6 expected ABLA stable 7 will d/c foley, can use pure wick with low grade fevers 8 pulm hygiene and rehab- routine 9 poss PPM on Monday if remains in HB and no source of fever      LOS: 3 days    Rowe Clack PA-C Pager [phone] 06/17/2023   Agree with above Pacer dependent Leukocytosis improving, but low grade fevers Will remove foley PPM likely Monday  Dierdre Mccalip O Tekelia Kareem

## 2023-06-18 DIAGNOSIS — I442 Atrioventricular block, complete: Secondary | ICD-10-CM | POA: Diagnosis not present

## 2023-06-18 DIAGNOSIS — Z952 Presence of prosthetic heart valve: Secondary | ICD-10-CM | POA: Diagnosis not present

## 2023-06-18 LAB — TYPE AND SCREEN
ABO/RH(D): O POS
Antibody Screen: NEGATIVE
Unit division: 0
Unit division: 0
Unit division: 0
Unit division: 0

## 2023-06-18 LAB — CBC
HCT: 29.8 % — ABNORMAL LOW (ref 36.0–46.0)
Hemoglobin: 9.5 g/dL — ABNORMAL LOW (ref 12.0–15.0)
MCH: 25.7 pg — ABNORMAL LOW (ref 26.0–34.0)
MCHC: 31.9 g/dL (ref 30.0–36.0)
MCV: 80.5 fL (ref 80.0–100.0)
Platelets: 204 10*3/uL (ref 150–400)
RBC: 3.7 MIL/uL — ABNORMAL LOW (ref 3.87–5.11)
RDW: 17.9 % — ABNORMAL HIGH (ref 11.5–15.5)
WBC: 14 10*3/uL — ABNORMAL HIGH (ref 4.0–10.5)
nRBC: 0.4 % — ABNORMAL HIGH (ref 0.0–0.2)

## 2023-06-18 LAB — BPAM RBC
Blood Product Expiration Date: 202502072359
Blood Product Expiration Date: 202502072359
Blood Product Expiration Date: 202502072359
Blood Product Expiration Date: 202502092359
ISSUE DATE / TIME: 202501172341
ISSUE DATE / TIME: 202501210814
ISSUE DATE / TIME: 202501210814
Unit Type and Rh: 5100
Unit Type and Rh: 5100
Unit Type and Rh: 5100
Unit Type and Rh: 5100

## 2023-06-18 LAB — BASIC METABOLIC PANEL
Anion gap: 10 (ref 5–15)
BUN: 34 mg/dL — ABNORMAL HIGH (ref 8–23)
CO2: 25 mmol/L (ref 22–32)
Calcium: 8.6 mg/dL — ABNORMAL LOW (ref 8.9–10.3)
Chloride: 102 mmol/L (ref 98–111)
Creatinine, Ser: 1.2 mg/dL — ABNORMAL HIGH (ref 0.44–1.00)
GFR, Estimated: 50 mL/min — ABNORMAL LOW (ref >60)
Glucose, Bld: 101 mg/dL — ABNORMAL HIGH (ref 70–99)
Potassium: 4 mmol/L (ref 3.5–5.1)
Sodium: 137 mmol/L (ref 135–145)

## 2023-06-18 LAB — EXPECTORATED SPUTUM ASSESSMENT W GRAM STAIN, RFLX TO RESP C

## 2023-06-18 LAB — GLUCOSE, CAPILLARY
Glucose-Capillary: 137 mg/dL — ABNORMAL HIGH (ref 70–99)
Glucose-Capillary: 119 mg/dL — ABNORMAL HIGH (ref 70–99)
Glucose-Capillary: 127 mg/dL — ABNORMAL HIGH (ref 70–99)
Glucose-Capillary: 105 mg/dL — ABNORMAL HIGH (ref 70–99)
Glucose-Capillary: 88 mg/dL (ref 70–99)

## 2023-06-18 LAB — MAGNESIUM: Magnesium: 2.3 mg/dL (ref 1.7–2.4)

## 2023-06-18 MED ORDER — LORATADINE 10 MG PO TABS
10.0000 mg | ORAL_TABLET | Freq: Every day | ORAL | Status: DC | PRN
  Administered 2023-06-18 – 2023-06-23 (×4): 10 mg via ORAL
  Filled 2023-06-18 (×4): qty 1

## 2023-06-18 MED ORDER — GLUCERNA SHAKE PO LIQD
237.0000 mL | Freq: Two times a day (BID) | ORAL | Status: DC
  Administered 2023-06-18 – 2023-06-23 (×6): 237 mL via ORAL

## 2023-06-18 NOTE — Progress Notes (Signed)
      [address]       [place] [address]             [phone]                 4 Days Post-Op Procedure(s) (LRB): ASCENDING AORTIC ROOT REPLACEMENT USING KONECT RESILIA AORTIC VALVE CONDUIT SIZE AND REATTACHMENT OF RIGHT AND LEFT CORONARY (N/A) TRANSESOPHAGEAL ECHOCARDIOGRAM (TEE) (N/A)   Events: No events _______________________________________________________________ Vitals: BP (!) 150/100   Pulse 80   Temp 98.4 F (36.9 C) (Oral)   Resp 12   Ht 4\' 9"  (1.448 m)   Wt 91.1 kg   SpO2 100%   BMI 43.46 kg/m  Filed Weights   06/16/23 0600 06/17/23 0500 06/18/23 0500  Weight: 94.8 kg 93.2 kg 91.1 kg     - Neuro: alert NAd  - Cardiovascular: paced  Drips: none.      - Pulm: EWOB    ABG    Component Value Date/Time   PHART 7.313 (L) 06/14/2023 1958   PCO2ART 52.4 (H) 06/14/2023 1958   PO2ART 110 (H) 06/14/2023 1958   HCO3 26.5 06/14/2023 1958   TCO2 28 06/14/2023 1958   ACIDBASEDEF 3.0 (H) 06/14/2023 1813   O2SAT 98 06/14/2023 1958    - Abd: ND - Extremity: warm  .Intake/Output      01/24 0701 01/25 0700 01/25 0701 01/26 0700   P.O. 240    Total Intake(mL/kg) 240 (2.6)    Urine (mL/kg/hr) 1150 (0.5)    Total Output 1150    Net -910         Urine Occurrence 1 x       _______________________________________________________________ Labs:    Latest Ref Rng & Units 06/18/2023    2:02 AM 06/17/2023    4:50 AM 06/16/2023    4:44 AM  CBC  WBC 4.0 - 10.5 K/uL 14.0  15.9  17.7   Hemoglobin 12.0 - 15.0 g/dL 9.5  9.0  9.6   Hematocrit 36.0 - 46.0 % 29.8  29.0  30.7   Platelets 150 - 400 K/uL 204  171  160       Latest Ref Rng & Units 06/18/2023    2:02 AM 06/17/2023    4:50 AM 06/16/2023    4:44 AM  CMP  Glucose 70 - 99 mg/dL 914  782  956   BUN 8 - 23 mg/dL 34  38  29   Creatinine 0.44 - 1.00 mg/dL 2.13  0.86  5.78   Sodium 135 - 145 mmol/L 137  140  136   Potassium 3.5 - 5.1 mmol/L 4.0  3.9  4.5   Chloride 98 - 111  mmol/L 102  104  103   CO2 22 - 32 mmol/L 25  25  24    Calcium 8.9 - 10.3 mg/dL 8.6  8.5  8.3     CXR: -  _______________________________________________________________  Assessment and Plan: POD 4 s/p root replacement  Neuro: pain controlled CV: awaiting pacemaker Pulm: IS, ambulation Renal: creat trending down GI: on diet Heme: stable ID: [email or web address] Endo: SSI Dispo: continue ICU care   Niasia Lanphear O Chiyoko Torrico 06/18/2023 10:05 AM

## 2023-06-18 NOTE — Plan of Care (Signed)
  Problem: Education: Goal: Knowledge of General Education information will improve Description: Including pain rating scale, medication(s)/side effects and non-pharmacologic comfort measures Outcome: Progressing   Problem: Health Behavior/Discharge Planning: Goal: Ability to manage health-related needs will improve Outcome: Progressing   Problem: Clinical Measurements: Goal: Ability to maintain clinical measurements within normal limits will improve Outcome: Progressing Goal: Will remain free from infection Outcome: Progressing Goal: Diagnostic test results will improve Outcome: Progressing Goal: Respiratory complications will improve Outcome: Progressing Goal: Cardiovascular complication will be avoided Outcome: Progressing   Problem: Activity: Goal: Risk for activity intolerance will decrease Outcome: Progressing   Problem: Nutrition: Goal: Adequate nutrition will be maintained Outcome: Progressing   Problem: Coping: Goal: Level of anxiety will decrease Outcome: Progressing   Problem: Elimination: Goal: Will not experience complications related to bowel motility Outcome: Progressing Goal: Will not experience complications related to urinary retention Outcome: Progressing   Problem: Pain Managment: Goal: General experience of comfort will improve and/or be controlled Outcome: Progressing   Problem: Safety: Goal: Ability to remain free from injury will improve Outcome: Progressing   Problem: Skin Integrity: Goal: Risk for impaired skin integrity will decrease Outcome: Progressing   Problem: Education: Goal: Will demonstrate proper wound care and an understanding of methods to prevent future damage Outcome: Progressing Goal: Knowledge of disease or condition will improve Outcome: Progressing Goal: Knowledge of the prescribed therapeutic regimen will improve Outcome: Progressing Goal: Individualized Educational Video(s) Outcome: Progressing   Problem:  Activity: Goal: Risk for activity intolerance will decrease Outcome: Progressing   Problem: Cardiac: Goal: Will achieve and/or maintain hemodynamic stability Outcome: Progressing   Problem: Clinical Measurements: Goal: Postoperative complications will be avoided or minimized Outcome: Progressing   Problem: Respiratory: Goal: Respiratory status will improve Outcome: Progressing   Problem: Skin Integrity: Goal: Wound healing without signs and symptoms of infection Outcome: Progressing Goal: Risk for impaired skin integrity will decrease Outcome: Progressing   Problem: Urinary Elimination: Goal: Ability to achieve and maintain adequate renal perfusion and functioning will improve Outcome: Progressing   Problem: Education: Goal: Ability to describe self-care measures that may prevent or decrease complications (Diabetes Survival Skills Education) will improve Outcome: Progressing Goal: Individualized Educational Video(s) Outcome: Progressing   Problem: Coping: Goal: Ability to adjust to condition or change in health will improve Outcome: Progressing   Problem: Fluid Volume: Goal: Ability to maintain a balanced intake and output will improve Outcome: Progressing   Problem: Health Behavior/Discharge Planning: Goal: Ability to identify and utilize available resources and services will improve Outcome: Progressing Goal: Ability to manage health-related needs will improve Outcome: Progressing   Problem: Metabolic: Goal: Ability to maintain appropriate glucose levels will improve Outcome: Progressing   Problem: Nutritional: Goal: Maintenance of adequate nutrition will improve Outcome: Progressing Goal: Progress toward achieving an optimal weight will improve Outcome: Progressing   Problem: Skin Integrity: Goal: Risk for impaired skin integrity will decrease Outcome: Progressing   Problem: Tissue Perfusion: Goal: Adequacy of tissue perfusion will improve Outcome:  Progressing

## 2023-06-18 NOTE — Progress Notes (Signed)
NAME:  [name], MRN:  [id number], DOB:  1955-08-07, LOS: 4 ADMISSION DATE:  06/14/2023, CONSULTATION DATE:  06/14/2023 REFERRING MD:  Tereso Newcomer, CHIEF COMPLAINT:  post-operative management   History of Present Illness:  68 year old female with past medical history of asthma, T2DM, HTN, HLD, severe aortic stenosis who presents for aortic valve replacement, possible aortic root replacement.  Patient had recent admission to [facility] 04/27/23-04/30/23 for progressive dyspnea requiring 4LNC. Appeared to be FVO. She was diuresed and eventually weaned off nitroglycerin gtt for concomitant hypertensive emergency. She had echo which showed EF 60-65%, no RWMA, G2DD, moderate AVR, severe AV stenosis. She had follow-up with outpatient cardiology and TCTS.   Intraoperatively, received AVR and root replacement. To ICU from OR on Epi, NE, dex, propofol, insulin. Getting albumin. X1 CT.   Pump time: 2h 62m Xclamp time: 2h 27m EBL: 476cc Cell saver: 230cc 2UPRBC  Pertinent  Medical History  T2DM, HTN, HFpEF, HLD, asthma, aortic stenosis   Significant Hospital Events: Including procedures, antibiotic start and stop dates in addition to other pertinent events   1/21: s/p AVR, aortic root replacement to ICU from OR post-operatively  1/22: Lasix 40mg  IV. NAEON. Extubated yesterday   Interim History / Subjective:  Appetite is still been limited, not eating much.  She denies new complaints.  Still having mild sputum production.  Afebrile overnight.   Objective   Blood pressure (!) 97/52, pulse 81, temperature 98.4 F (36.9 C), temperature source Oral, resp. rate 11, height 4\' 9"  (1.448 m), weight 91.1 kg, SpO2 99%.        Intake/Output Summary (Last 24 hours) at 06/18/2023 0715 Last data filed at 06/17/2023 1600 Gross per 24 hour  Intake 240 ml  Output 1150 ml  Net -910 ml   Filed Weights   06/16/23 0600 06/17/23 0500 06/18/23 0500  Weight: 94.8 kg 93.2 kg 91.1 kg    Examination:   General: Elderly woman lying in the recliner no acute distress HENT: [place]/AT, eyes anicteric Lungs: Breathing comfortably on nasal cannula, CTAB.  No conversational dyspnea.  No observed coughing. Cardiovascular: Paced rhythm, S1-S2 Abdomen: Obese, soft, nontender Extremities: Mild pedal edema Neuro: Awake and alert, answering questions properly, moving all extremities. Derm: Warm, dry, no diffuse rashes  BUN 34 Cr 1.2 WBC 14 H/H 9.5/29.8 Platelets 204 Resp culture: rare PMN, few GPC, GNR Blood cultures pending  Resolved Hospital Problem list   Post-operative vent management  Post bypass vasoplegia   Assessment & Plan:  Severe aortic stenosis s/p AVR Severe aortic root calcification s/p aortic root replacement  Expected post-operative ABLA Expected post-operative consumptive thrombocytopenia  Severe AS with moderate AVR now s/p replacement. Intraoperatively found to have significant aortic root calcifications and proceeded with root replacement.  -Continue gentle diuresis - Postop management per T CTS - Continue pacing, eventually needs permanent pacemaker when fevers are resolved. - Aspirin daily - Pain control per protocol - Progressive mobility - Encourage p.o. intake, and add Glucerna.  Fevers; has had discolored sputum raising concern for pneumonia. 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 -Needs to be fever free and ensure blood cultures are negative before PPM placement Continue epicardial pacing  Diabetes, minimal insulin requirements -SSI as needed - Goal blood glucose less than 180  Hypertension  -Holding beta-blockers and all AV nodal blocking meds. - Continue amlodipine  Hyperlipidemia  Continue rosuvastatin  AKI on CKD 3b, improved -Strict I's/O - Renally dose meds and avoid nephrotoxic  meds - Monitor   Best Practice (right click and "Reselect all SmartList Selections" daily)    Diet/type: Regular consistency (see orders) DVT prophylaxis: LMWH Pressure ulcer(s): none GI prophylaxis: PPI Lines: N/A Foley:  N/A Code Status:  full code Last date of multidisciplinary goals of care discussion [per primary]  Critical care time:      Steffanie Dunn, DO 06/18/23 8:23 AM [facility]  For contact information, see Amion. If no response to pager, please call PCCM consult pager. After hours, 7PM- 7AM, please call Elink.

## 2023-06-18 NOTE — Progress Notes (Signed)
Rounding Note    Patient Name: [name] Date of Encounter: 06/18/2023  [facility] Cardiologist: Charlton Haws, MD   Subjective   OOB, reports chest wall discomfort, back is sore, no SOB, or CP otherwise  Inpatient Medications    Scheduled Meds:  acetaminophen  1,000 mg Oral Q6H   Or   acetaminophen (TYLENOL) oral liquid 160 mg/5 mL  1,000 mg Per Tube Q6H   amLODipine  10 mg Oral QHS   aspirin EC  325 mg Oral Daily   Or   aspirin  324 mg Per Tube Daily   bisacodyl  10 mg Oral Daily   Or   bisacodyl  10 mg Rectal Daily   Chlorhexidine Gluconate Cloth  6 each Topical Daily   docusate sodium  200 mg Oral Daily   enoxaparin (LOVENOX) injection  40 mg Subcutaneous QHS   ezetimibe  10 mg Oral Daily   insulin aspart  0-15 Units Subcutaneous TID WC   rosuvastatin  10 mg Oral QODAY   sodium chloride flush  10-40 mL Intracatheter Q12H   sodium chloride flush  3 mL Intravenous Q12H   Continuous Infusions:  albumin human Stopped (06/14/23 2001)   PRN Meds: albumin human, dextrose, ondansetron (ZOFRAN) IV, mouth rinse, oxyCODONE, sodium chloride flush, sodium chloride flush, traMADol   Vital Signs    Vitals:   06/18/23 0200 06/18/23 0300 06/18/23 0400 06/18/23 0500  BP: 132/69 110/63 130/67 (!) 97/52  Pulse: 80 79 80 81  Resp: 15 11 (!) 8 11  Temp:      TempSrc:      SpO2: 99% 98% 98% 99%  Weight:      Height:        Intake/Output Summary (Last 24 hours) at 06/18/2023 0630 Last data filed at 06/17/2023 1600 Gross per 24 hour  Intake 240 ml  Output 1150 ml  Net -910 ml      06/17/2023    5:00 AM 06/16/2023    6:00 AM 06/15/2023    5:00 AM  Last 3 Weights  Weight (lbs) 205 lb 6.4 oz 209 lb 1.6 oz 197 lb 15.6 oz  Weight (kg) 93.169 kg 94.847 kg 89.8 kg      Telemetry    V paced, CHB underlying remains has an escape at about 30 - Personally Reviewed  ECG    No new EKGs - Personally Reviewed  Physical Exam   GEN: No acute distress.    Neck: No JVD Cardiac: RRR (paced), no murmurs, rubs, or gallops.  Respiratory: decreased at the bases. GI: Soft, nontender, non-distended  MS: No edema; No deformity. Neuro:  Nonfocal  Psych: Normal affect   Labs    High Sensitivity Troponin:  No results for input(s): "TROPONINIHS" in the last 720 hours.   Chemistry Recent Labs  Lab 06/14/23 0626 06/14/23 0804 06/15/23 1650 06/16/23 0444 06/17/23 0450 06/18/23 0202  NA 141   < > 134* 136 140 137  K 3.8   < > 4.5 4.5 3.9 4.0  CL 106   < > 101 103 104 102  CO2 27   < > 21* 24 25 25   GLUCOSE 115*   < > 240* 131* 132* 101*  BUN 16   < > 22 29* 38* 34*  CREATININE 1.34*   < > 1.83* 2.01* 1.83* 1.20*  CALCIUM 9.5   < > 8.3* 8.3* 8.5* 8.6*  MG  --    < > 2.8*  --  2.3  2.3  PROT 7.0  --   --   --   --   --   ALBUMIN 3.8  --   --   --   --   --   AST 22  --   --   --   --   --   ALT 21  --   --   --   --   --   ALKPHOS 69  --   --   --   --   --   BILITOT 0.4  --   --   --   --   --   GFRNONAA 44*   < > 30* 27* 30* 50*  ANIONGAP 8   < > 12 9 11 10    < > = values in this interval not displayed.    Lipids No results for input(s): "CHOL", "TRIG", "HDL", "LABVLDL", "LDLCALC", "CHOLHDL" in the last 168 hours.  Hematology Recent Labs  Lab 06/16/23 0444 06/17/23 0450 06/18/23 0202  WBC 17.7* 15.9* 14.0*  RBC 3.78* 3.64* 3.70*  HGB 9.6* 9.0* 9.5*  HCT 30.7* 29.0* 29.8*  MCV 81.2 79.7* 80.5  MCH 25.4* 24.7* 25.7*  MCHC 31.3 31.0 31.9  RDW 18.1* 17.8* 17.9*  PLT 160 171 204   Thyroid No results for input(s): "TSH", "FREET4" in the last 168 hours.  BNPNo results for input(s): "BNP", "PROBNP" in the last 168 hours.  DDimer No results for input(s): "DDIMER" in the last 168 hours.   Radiology    No results found.   Cardiac Studies    06/14/23: inter-op TEE POST-OP IMPRESSIONS  _ Left Ventricle: The left ventricle is unchanged from pre-bypass. has  normal  systolic function. Underfilled cavity.  _ Right Ventricle:  moderately reduced function after chest closure;  epinephrine  27mcg/min added for support.  _ Aorta: there is no dissection present in the aorta.  _ Left Atrial Appendage: The left atrial appendage appears unchanged from  pre-bypass.  _ Aortic Valve: Status post bioprosthetic aortic valve replacement.  Normally  functioning prosthetic valve with no visualized regurgitation. Mean  transvalvular gradient of .  _ Mitral Valve: No stenosis present. There is moderate regurgitation.No  flow  reversal in the pulmonary veins.  _ Tricuspid Valve: The tricuspid valve appears unchanged from pre-bypass.  There  is mild regurgitation.  _ Pulmonic Valve: The pulmonic valve appears unchanged from pre-bypass.  _ Interatrial Septum: The interatrial septum appears unchanged from  pre-bypass.  _ Pericardium: The pericardium appears unchanged from pre-bypass.    Patient Profile     68 y.o. female w/PMHx of  HTN, HLD, T2DM, asthma, admitted for her severe AS >>>  AVR and aortic root replacement (coronary re-implant)  Assessment & Plan    CHB s/p Bentall procedure Today is POD #4 Aortic Root replacement with a 21 mm Connect Pericardial Aortic valve conduit   Remains in CHB 1/22 was febrile to 101 1/23 intermittently through the day 100 Leukocytosis trending down 18 > 17 > 15.9 > 14 today Will monitor over the weekend. Possible pacemaker Monday   Maurice Small, MD 06/18/2023 6:30 AM

## 2023-06-19 DIAGNOSIS — Z952 Presence of prosthetic heart valve: Secondary | ICD-10-CM | POA: Diagnosis not present

## 2023-06-19 DIAGNOSIS — N179 Acute kidney failure, unspecified: Secondary | ICD-10-CM

## 2023-06-19 DIAGNOSIS — I350 Nonrheumatic aortic (valve) stenosis: Secondary | ICD-10-CM | POA: Diagnosis not present

## 2023-06-19 DIAGNOSIS — E119 Type 2 diabetes mellitus without complications: Secondary | ICD-10-CM | POA: Diagnosis not present

## 2023-06-19 DIAGNOSIS — N1832 Chronic kidney disease, stage 3b: Secondary | ICD-10-CM

## 2023-06-19 DIAGNOSIS — I442 Atrioventricular block, complete: Secondary | ICD-10-CM | POA: Diagnosis not present

## 2023-06-19 DIAGNOSIS — I10 Essential (primary) hypertension: Secondary | ICD-10-CM | POA: Diagnosis not present

## 2023-06-19 LAB — CBC
HCT: 31.7 % — ABNORMAL LOW (ref 36.0–46.0)
Hemoglobin: 9.8 g/dL — ABNORMAL LOW (ref 12.0–15.0)
MCH: 24.9 pg — ABNORMAL LOW (ref 26.0–34.0)
MCHC: 30.9 g/dL (ref 30.0–36.0)
MCV: 80.5 fL (ref 80.0–100.0)
Platelets: 234 10*3/uL (ref 150–400)
RBC: 3.94 MIL/uL (ref 3.87–5.11)
RDW: 17.9 % — ABNORMAL HIGH (ref 11.5–15.5)
WBC: 10.5 10*3/uL (ref 4.0–10.5)
nRBC: 0.7 % — ABNORMAL HIGH (ref 0.0–0.2)

## 2023-06-19 LAB — BASIC METABOLIC PANEL
Anion gap: 10 (ref 5–15)
Anion gap: 15 (ref 5–15)
BUN: 27 mg/dL — ABNORMAL HIGH (ref 8–23)
BUN: 21 mg/dL (ref 8–23)
CO2: 28 mmol/L (ref 22–32)
CO2: 27 mmol/L (ref 22–32)
Calcium: 8.9 mg/dL (ref 8.9–10.3)
Calcium: 9.2 mg/dL (ref 8.9–10.3)
Chloride: 102 mmol/L (ref 98–111)
Chloride: 102 mmol/L (ref 98–111)
Creatinine, Ser: 1 mg/dL (ref 0.44–1.00)
Creatinine, Ser: 1.03 mg/dL — ABNORMAL HIGH (ref 0.44–1.00)
GFR, Estimated: >60 mL/min (ref >60)
GFR, Estimated: 60 mL/min — ABNORMAL LOW (ref >60)
Glucose, Bld: 127 mg/dL — ABNORMAL HIGH (ref 70–99)
Glucose, Bld: 139 mg/dL — ABNORMAL HIGH (ref 70–99)
Potassium: 4 mmol/L (ref 3.5–5.1)
Potassium: 3.7 mmol/L (ref 3.5–5.1)
Sodium: 140 mmol/L (ref 135–145)
Sodium: 144 mmol/L (ref 135–145)

## 2023-06-19 LAB — MAGNESIUM
Magnesium: 2.2 mg/dL (ref 1.7–2.4)
Magnesium: 2 mg/dL (ref 1.7–2.4)

## 2023-06-19 LAB — HEPARIN LEVEL (UNFRACTIONATED): Heparin Unfractionated: 0.12 [IU]/mL — ABNORMAL LOW (ref 0.30–0.70)

## 2023-06-19 LAB — GLUCOSE, CAPILLARY
Glucose-Capillary: 108 mg/dL — ABNORMAL HIGH (ref 70–99)
Glucose-Capillary: 113 mg/dL — ABNORMAL HIGH (ref 70–99)
Glucose-Capillary: 107 mg/dL — ABNORMAL HIGH (ref 70–99)

## 2023-06-19 MED ORDER — METOPROLOL TARTRATE 12.5 MG HALF TABLET
12.5000 mg | ORAL_TABLET | Freq: Once | ORAL | Status: AC
  Administered 2023-06-19: 12.5 mg via ORAL
  Filled 2023-06-19: qty 1

## 2023-06-19 MED ORDER — POTASSIUM CHLORIDE 10 MEQ/100ML IV SOLN
10.0000 meq | INTRAVENOUS | Status: DC
  Filled 2023-06-19: qty 100

## 2023-06-19 MED ORDER — AMIODARONE IV BOLUS ONLY 150 MG/100ML: 150.0000 mg | Freq: Once | INTRAVENOUS | Status: DC

## 2023-06-19 MED ORDER — AMIODARONE LOAD VIA INFUSION
150.0000 mg | Freq: Once | INTRAVENOUS | Status: AC
  Administered 2023-06-19: 150 mg via INTRAVENOUS
  Filled 2023-06-19: qty 83.34

## 2023-06-19 MED ORDER — POTASSIUM CHLORIDE 10 MEQ/50ML IV SOLN: 10.0000 meq | INTRAVENOUS | Status: DC

## 2023-06-19 MED ORDER — HEPARIN (PORCINE) 25000 UT/250ML-% IV SOLN
1400.0000 [IU]/h | INTRAVENOUS | Status: DC
  Administered 2023-06-19: 750 [IU]/h via INTRAVENOUS
  Administered 2023-06-21 – 2023-06-22 (×2): 1400 [IU]/h via INTRAVENOUS
  Filled 2023-06-19 (×4): qty 250

## 2023-06-19 MED ORDER — AMIODARONE HCL IN DEXTROSE 360-4.14 MG/200ML-% IV SOLN
INTRAVENOUS | Status: AC
  Administered 2023-06-19: 30 mg/h via INTRAVENOUS
  Filled 2023-06-19: qty 200

## 2023-06-19 MED ORDER — AMIODARONE HCL IN DEXTROSE 360-4.14 MG/200ML-% IV SOLN
30.0000 mg/h | INTRAVENOUS | Status: DC
  Administered 2023-06-20 (×2): 30 mg/h via INTRAVENOUS
  Filled 2023-06-19 (×2): qty 200

## 2023-06-19 MED ORDER — POTASSIUM CHLORIDE CRYS ER 10 MEQ PO TBCR
20.0000 meq | EXTENDED_RELEASE_TABLET | ORAL | Status: AC
  Administered 2023-06-19 – 2023-06-20 (×3): 20 meq via ORAL
  Filled 2023-06-19 (×3): qty 2

## 2023-06-19 MED ORDER — LOSARTAN POTASSIUM 25 MG PO TABS
25.0000 mg | ORAL_TABLET | Freq: Every day | ORAL | Status: DC
Start: 2023-06-19 — End: 2023-06-23
  Administered 2023-06-19 – 2023-06-22 (×4): 25 mg via ORAL
  Filled 2023-06-19 (×5): qty 1

## 2023-06-19 MED ORDER — AMIODARONE HCL IN DEXTROSE 360-4.14 MG/200ML-% IV SOLN
60.0000 mg/h | INTRAVENOUS | Status: AC
  Administered 2023-06-19 (×2): 60 mg/h via INTRAVENOUS
  Filled 2023-06-19: qty 400

## 2023-06-19 MED ORDER — FUROSEMIDE 10 MG/ML IJ SOLN
20.0000 mg | Freq: Once | INTRAMUSCULAR | Status: AC
  Administered 2023-06-19: 20 mg via INTRAVENOUS
  Filled 2023-06-19: qty 2

## 2023-06-19 NOTE — Progress Notes (Signed)
NAME:  [name], MRN:  [id number], DOB:  11-06-1955, LOS: 5 ADMISSION DATE:  06/14/2023, CONSULTATION DATE:  06/14/2023 REFERRING MD:  Tereso Newcomer, CHIEF COMPLAINT:  post-operative management   History of Present Illness:  68 year old female with past medical history of asthma, T2DM, HTN, HLD, severe aortic stenosis who presents for aortic valve replacement, possible aortic root replacement.  Patient had recent admission to [facility] 04/27/23-04/30/23 for progressive dyspnea requiring 4LNC. Appeared to be FVO. She was diuresed and eventually weaned off nitroglycerin gtt for concomitant hypertensive emergency. She had echo which showed EF 60-65%, no RWMA, G2DD, moderate AVR, severe AV stenosis. She had follow-up with outpatient cardiology and TCTS.   Intraoperatively, received AVR and root replacement. To ICU from OR on Epi, NE, dex, propofol, insulin. Getting albumin. X1 CT.   Pump time: 2h 58m Xclamp time: 2h 24m EBL: 476cc Cell saver: 230cc 2UPRBC  Pertinent  Medical History  T2DM, HTN, HFpEF, HLD, asthma, aortic stenosis   Significant Hospital Events: Including procedures, antibiotic start and stop dates in addition to other pertinent events   1/21: s/p AVR, aortic root replacement to ICU from OR post-operatively  1/22: Lasix 40mg  IV. NAEON. Extubated yesterday   Interim History / Subjective:  Walking and eating well. Still some coughing and sputum. Afebrile.   Objective   Blood pressure (!) 164/86, pulse 80, temperature 99 F (37.2 C), temperature source Oral, resp. rate 11, height 4\' 9"  (1.448 m), weight 90.2 kg, SpO2 98%.        Intake/Output Summary (Last 24 hours) at 06/19/2023 0727 Last data filed at 06/19/2023 0400 Gross per 24 hour  Intake 440 ml  Output --  Net 440 ml   Filed Weights   06/17/23 0500 06/18/23 0500 06/19/23 0500  Weight: 93.2 kg 91.1 kg 90.2 kg    Examination:  General: Elderly woman sitting in the recliner no acute distress HENT: [place]/AT, eyes  anicteric Lungs: Breathing comfortably on nasal cannula, CTAB.  No observed coughing. Cardiovascular: Paced rhythm, A-fib on telemetry.  S1-S2, regular rate and rhythm Abdomen: Obese, soft, nontender Extremities: Mild edema Neuro: Awake, alert, answering questions properly, moving all extremities Derm: Warm, dry, no diffuse rashes  BUN 27 Cr 1.0 WBC 10.5 H/H 9.8/31.7 Platelets 234 Resp culture: rare PMN, few GPC, GNR, rare yeast>  Blood cultures NGTD  Resolved Hospital Problem list   Post-operative vent management  Post bypass vasoplegia   Assessment & Plan:  Severe aortic stenosis s/p AVR (bioprosthetic) Severe aortic root calcification s/p aortic root replacement  Expected post-operative ABLA Expected post-operative consumptive thrombocytopenia-resolved Severe AS with moderate AVR now s/p replacement. Intraoperatively found to have significant aortic root calcifications and proceeded with root replacement.  -Continue gentle diuresis-Lasix 20 today - Continue pacing.  Start heparin drip for atrial fibrillation -Continue aspirin daily - Pain control per protocol - Continue progressive mobility  New onset atrial fibrillation - Heparin, eventually will go home on Eliquis -Not quiring rate control with heart block  Fevers; has had discolored sputum raising concern for pneumonia. 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 - Eventually needs pacemaker, need to ensure that she is fever free.  Diabetes, minimal insulin requirements -Sliding scale insulin as needed - Goal blood glucose <180  Hypertension  -Continue amlodipine -add losartan; watch renal function and K+ -Holding beta-blockers and all AV nodal blocking meds.  Hyperlipidemia  -con't crestor  AKI on CKD 3b, improved - Strict I's/O - Renally dose meds and avoid  nephrotoxic meds - Monitor  Continue ICU care for [facility] pacemaker dependence   Best Practice (right click and "Reselect all SmartList Selections" daily)   Diet/type: Regular consistency (see orders) DVT prophylaxis: LMWH Pressure ulcer(s): none GI prophylaxis: N/A Lines: N/A Foley:  N/A Code Status:  full code Last date of multidisciplinary goals of care discussion [per primary]  Critical care time:      Steffanie Dunn, DO 06/19/23 8:15 AM [facility]  For contact information, see Amion. If no response to pager, please call PCCM consult pager. After hours, 7PM- 7AM, please call Elink.

## 2023-06-19 NOTE — Progress Notes (Signed)
PHARMACY - ANTICOAGULATION CONSULT NOTE  Pharmacy Consult for IV heparin Indication: atrial fibrillation  Allergies  Allergen Reactions   Crestor [Rosuvastatin] Other (See Comments)    Myalgia; patient can only tolerate taking 10 mg every other day   Robaxin [Methocarbamol] Other (See Comments)    Insomnia   Toradol [Ketorolac Tromethamine] Other (See Comments)   Zocor [Simvastatin] Other (See Comments)    Myalgias    Lipitor [Atorvastatin] Other (See Comments)    Myalgia   Sulfa Antibiotics Hives    Patient Measurements: Height: 4\' 9"  (144.8 cm) Weight: 90.2 kg (198 lb 13.7 oz) IBW/kg (Calculated) : 38.6 Heparin Dosing Weight: ~ 60 kg  Vital Signs: Temp: 98.7 F (37.1 C) (01/26 0800) Temp Source: Oral (01/26 0800) BP: 126/62 (01/26 1200) Pulse Rate: 82 (01/26 1200)  Labs: Recent Labs    06/17/23 0450 06/18/23 0202 06/19/23 0335  HGB 9.0* 9.5* 9.8*  HCT 29.0* 29.8* 31.7*  PLT 171 204 234  CREATININE 1.83* 1.20* 1.00    Estimated Creatinine Clearance: 51.7 mL/min (by C-G formula based on SCr of 1 mg/dL).   Medical History: Past Medical History:  Diagnosis Date   Allergy    Anemia    Anxiety    Arthritis    Asthma    BMI 40.0-44.9, adult (HCC) 04/07/2014   DM type 2 (diabetes mellitus, type 2) (HCC)    GERD (gastroesophageal reflux disease)    HTN (hypertension)    Hypercholesterolemia    Osteoarthritis of left hip 04/07/2014   Reflux    Severe aortic stenosis    Spinal headache    with C-Section and with spinal fusion in 2019    Medications:  Infusions:   albumin human Stopped (06/14/23 2001)   heparin 750 Units/hr (06/19/23 1200)    Assessment: 68 yo female s/p ascending aortic root replacement, pharmacy asked to start IV heparin for new afib.  Planning possible pacemaker later this week so avoiding anticoagulation for now.  Goal of Therapy:  Heparin level 0.3-0.7 units/ml Monitor platelets by anticoagulation protocol: Yes   Plan:   Start IV heparin at 750 units/hr. Check heparin level in 6 hrs. Daily heparin level and CBC. F/u plans for oral anticoagulation eventually.  Reece Leader, Colon Flattery, BCCP Clinical Pharmacist  06/19/2023 12:33 PM   [facility] pharmacy phone numbers are listed on [email or web address]

## 2023-06-19 NOTE — Progress Notes (Signed)
Now tachycardic to 140. Asked RN to notify cardiology to make decision on giving AVN blocking med with cardiology involvement since EP is on call. Not in distress or low flow state, no indication for urgent cardioversion. EKG ordered by Cardiology.    Steffanie Dunn, DO 06/19/23 7:01 PM [facility]  For contact information, see Amion. If no response to pager, please call PCCM consult pager. After hours, 7PM- 7AM, please call Elink.

## 2023-06-19 NOTE — Progress Notes (Signed)
Rounding Note    Patient Name: [name] Date of Encounter: 06/19/2023  [facility] Cardiologist: Charlton Haws, MD   Subjective   OOB, reports chest wall discomfort, back is sore, no SOB, or CP otherwise  Inpatient Medications    Scheduled Meds:  acetaminophen  1,000 mg Oral Q6H   Or   acetaminophen (TYLENOL) oral liquid 160 mg/5 mL  1,000 mg Per Tube Q6H   amLODipine  10 mg Oral QHS   aspirin EC  325 mg Oral Daily   Or   aspirin  324 mg Per Tube Daily   bisacodyl  10 mg Oral Daily   Or   bisacodyl  10 mg Rectal Daily   Chlorhexidine Gluconate Cloth  6 each Topical Daily   docusate sodium  200 mg Oral Daily   enoxaparin (LOVENOX) injection  40 mg Subcutaneous QHS   ezetimibe  10 mg Oral Daily   feeding supplement (GLUCERNA SHAKE)  237 mL Oral BID BM   insulin aspart  0-15 Units Subcutaneous TID WC   rosuvastatin  10 mg Oral QODAY   sodium chloride flush  10-40 mL Intracatheter Q12H   sodium chloride flush  3 mL Intravenous Q12H   Continuous Infusions:  albumin human Stopped (06/14/23 2001)   PRN Meds: albumin human, dextrose, loratadine, ondansetron (ZOFRAN) IV, mouth rinse, oxyCODONE, sodium chloride flush, sodium chloride flush, traMADol   Vital Signs    Vitals:   06/19/23 0600 06/19/23 0610 06/19/23 0620 06/19/23 0630  BP:  (!) 164/86    Pulse: 80 79 79 80  Resp: (!) 21 19 (!) 35 11  Temp:      TempSrc:      SpO2: 99% 97% 95% 98%  Weight:      Height:        Intake/Output Summary (Last 24 hours) at 06/19/2023 0700 Last data filed at 06/19/2023 0400 Gross per 24 hour  Intake 440 ml  Output --  Net 440 ml      06/19/2023    5:00 AM 06/18/2023    5:00 AM 06/17/2023    5:00 AM  Last 3 Weights  Weight (lbs) 198 lb 13.7 oz 200 lb 13.4 oz 205 lb 6.4 oz  Weight (kg) 90.2 kg 91.1 kg 93.169 kg      Telemetry    V paced, CHB underlying remains has an escape at about 48 bpm - Personally Reviewed  Epicardial wire threshold about 2V  this AM  ECG    No new EKGs - Personally Reviewed  Physical Exam   GEN: No acute distress.   Neck: No JVD Cardiac: RRR (paced), no murmurs, rubs, or gallops.  Respiratory: decreased at the bases. GI: Soft, nontender, non-distended  MS: No edema; No deformity. Neuro:  Nonfocal  Psych: Normal affect   Labs    High Sensitivity Troponin:  No results for input(s): "TROPONINIHS" in the last 720 hours.   Chemistry Recent Labs  Lab 06/14/23 0626 06/14/23 0804 06/17/23 0450 06/18/23 0202 06/19/23 0335  NA 141   < > 140 137 140  K 3.8   < > 3.9 4.0 4.0  CL 106   < > 104 102 102  CO2 27   < > 25 25 28   GLUCOSE 115*   < > 132* 101* 127*  BUN 16   < > 38* 34* 27*  CREATININE 1.34*   < > 1.83* 1.20* 1.00  CALCIUM 9.5   < > 8.5* 8.6* 8.9  MG  --    < >  2.3 2.3 2.2  PROT 7.0  --   --   --   --   ALBUMIN 3.8  --   --   --   --   AST 22  --   --   --   --   ALT 21  --   --   --   --   ALKPHOS 69  --   --   --   --   BILITOT 0.4  --   --   --   --   GFRNONAA 44*   < > 30* 50* >60  ANIONGAP 8   < > 11 10 10    < > = values in this interval not displayed.    Lipids No results for input(s): "CHOL", "TRIG", "HDL", "LABVLDL", "LDLCALC", "CHOLHDL" in the last 168 hours.  Hematology Recent Labs  Lab 06/17/23 0450 06/18/23 0202 06/19/23 0335  WBC 15.9* 14.0* 10.5  RBC 3.64* 3.70* 3.94  HGB 9.0* 9.5* 9.8*  HCT 29.0* 29.8* 31.7*  MCV 79.7* 80.5 80.5  MCH 24.7* 25.7* 24.9*  MCHC 31.0 31.9 30.9  RDW 17.8* 17.9* 17.9*  PLT 171 204 234   Thyroid No results for input(s): "TSH", "FREET4" in the last 168 hours.  BNPNo results for input(s): "BNP", "PROBNP" in the last 168 hours.  DDimer No results for input(s): "DDIMER" in the last 168 hours.   Radiology    No results found.   Cardiac Studies    06/14/23: inter-op TEE POST-OP IMPRESSIONS  _ Left Ventricle: The left ventricle is unchanged from pre-bypass. has  normal  systolic function. Underfilled cavity.  _ Right Ventricle:  moderately reduced function after chest closure;  epinephrine  25mcg/min added for support.  _ Aorta: there is no dissection present in the aorta.  _ Left Atrial Appendage: The left atrial appendage appears unchanged from  pre-bypass.  _ Aortic Valve: Status post bioprosthetic aortic valve replacement.  Normally  functioning prosthetic valve with no visualized regurgitation. Mean  transvalvular gradient of .  _ Mitral Valve: No stenosis present. There is moderate regurgitation.No  flow  reversal in the pulmonary veins.  _ Tricuspid Valve: The tricuspid valve appears unchanged from pre-bypass.  There  is mild regurgitation.  _ Pulmonic Valve: The pulmonic valve appears unchanged from pre-bypass.  _ Interatrial Septum: The interatrial septum appears unchanged from  pre-bypass.  _ Pericardium: The pericardium appears unchanged from pre-bypass.    Patient Profile     68 y.o. female w/PMHx of  HTN, HLD, T2DM, asthma, admitted for her severe AS >>>  AVR and aortic root replacement (coronary re-implant)  Assessment & Plan    CHB s/p Bentall procedure Today is POD #5 Aortic Root replacement with a 21 mm Connect Pericardial Aortic valve conduit   Remains in CHB -- underlying rhythm is ~ 48 bpm, slow conduction of flutter is possible, but I suspect this is an escape rhythm Fever 1/24 -- blood cultures pending Leukocytosis now resolved  Atrial flutter Onset about 0600 1/26 Heparin for anticoagulation with anticipated procedure  Maurice Small, MD 06/19/2023 7:00 AM

## 2023-06-19 NOTE — Progress Notes (Signed)
Spoke to CCM-MD who recommended I contacted CVTS. Per Dr Liana Gerold, hold norvasc. Give 12.5 of po metoprolol now X1 and also repeat 150 amio bolus X1 now

## 2023-06-19 NOTE — Progress Notes (Signed)
Intermittent episodes of svt vs ST vs AFIB, Frequent ectopy. BP 134/65. Discussed with Elink for lab orders

## 2023-06-19 NOTE — Progress Notes (Signed)
      [address]       [place] [address]             [phone]                 5 Days Post-Op Procedure(s) (LRB): ASCENDING AORTIC ROOT REPLACEMENT USING KONECT RESILIA AORTIC VALVE CONDUIT SIZE AND REATTACHMENT OF RIGHT AND LEFT CORONARY (N/A) TRANSESOPHAGEAL ECHOCARDIOGRAM (TEE) (N/A)   Events: No events _______________________________________________________________ Vitals: BP (!) 164/86   Pulse 80   Temp 98.7 F (37.1 C) (Oral)   Resp 11   Ht 4\' 9"  (1.448 m)   Wt 90.2 kg   SpO2 98%   BMI 43.03 kg/m  Filed Weights   06/17/23 0500 06/18/23 0500 06/19/23 0500  Weight: 93.2 kg 91.1 kg 90.2 kg     - Neuro: alert NAd  - Cardiovascular: paced  Drips: none.      - Pulm: EWOB    ABG    Component Value Date/Time   PHART 7.313 (L) 06/14/2023 1958   PCO2ART 52.4 (H) 06/14/2023 1958   PO2ART 110 (H) 06/14/2023 1958   HCO3 26.5 06/14/2023 1958   TCO2 28 06/14/2023 1958   ACIDBASEDEF 3.0 (H) 06/14/2023 1813   O2SAT 98 06/14/2023 1958    - Abd: ND - Extremity: warm  .Intake/Output      01/25 0701 01/26 0700 01/26 0701 01/27 0700   P.O. 440    Total Intake(mL/kg) 440 (4.9)    Urine (mL/kg/hr)     Total Output     Net +440         Urine Occurrence 6 x       _______________________________________________________________ Labs:    Latest Ref Rng & Units 06/19/2023    3:35 AM 06/18/2023    2:02 AM 06/17/2023    4:50 AM  CBC  WBC 4.0 - 10.5 K/uL 10.5  14.0  15.9   Hemoglobin 12.0 - 15.0 g/dL 9.8  9.5  9.0   Hematocrit 36.0 - 46.0 % 31.7  29.8  29.0   Platelets 150 - 400 K/uL 234  204  171       Latest Ref Rng & Units 06/19/2023    3:35 AM 06/18/2023    2:02 AM 06/17/2023    4:50 AM  CMP  Glucose 70 - 99 mg/dL 629  528  413   BUN 8 - 23 mg/dL 27  34  38   Creatinine 0.44 - 1.00 mg/dL 2.44  0.10  2.72   Sodium 135 - 145 mmol/L 140  137  140   Potassium 3.5 - 5.1 mmol/L 4.0  4.0  3.9   Chloride 98 - 111 mmol/L 102  102   104   CO2 22 - 32 mmol/L 28  25  25    Calcium 8.9 - 10.3 mg/dL 8.9  8.6  8.5     CXR: -  _______________________________________________________________  Assessment and Plan: POD 5 s/p root replacement  Neuro: pain controlled CV: awaiting pacemaker Pulm: IS, ambulation Renal: creat trending down GI: on diet Heme: stable ID: [email or web address] Endo: SSI Dispo: continue ICU care   Saadia Dewitt O Muaaz Brau 06/19/2023 9:23 AM

## 2023-06-19 NOTE — Progress Notes (Signed)
PHARMACY - ANTICOAGULATION CONSULT NOTE  Pharmacy Consult for IV heparin Indication: atrial fibrillation  Allergies  Allergen Reactions   Crestor [Rosuvastatin] Other (See Comments)    Myalgia; patient can only tolerate taking 10 mg every other day   Robaxin [Methocarbamol] Other (See Comments)    Insomnia   Toradol [Ketorolac Tromethamine] Other (See Comments)   Zocor [Simvastatin] Other (See Comments)    Myalgias    Lipitor [Atorvastatin] Other (See Comments)    Myalgia   Sulfa Antibiotics Hives    Patient Measurements: Height: 4\' 9"  (144.8 cm) Weight: 90.2 kg (198 lb 13.7 oz) IBW/kg (Calculated) : 38.6 Heparin Dosing Weight: ~ 60 kg  Vital Signs: Temp: 99 F (37.2 C) (01/26 1600) Temp Source: Oral (01/26 1600) BP: 145/65 (01/26 1600) Pulse Rate: 80 (01/26 1600)  Labs: Recent Labs    06/17/23 0450 06/18/23 0202 06/19/23 0335 06/19/23 1618  HGB 9.0* 9.5* 9.8*  --   HCT 29.0* 29.8* 31.7*  --   PLT 171 204 234  --   HEPARINUNFRC  --   --   --  0.12*  CREATININE 1.83* 1.20* 1.00  --     Estimated Creatinine Clearance: 51.7 mL/min (by C-G formula based on SCr of 1 mg/dL).   Medical History: Past Medical History:  Diagnosis Date   Allergy    Anemia    Anxiety    Arthritis    Asthma    BMI 40.0-44.9, adult (HCC) 04/07/2014   DM type 2 (diabetes mellitus, type 2) (HCC)    GERD (gastroesophageal reflux disease)    HTN (hypertension)    Hypercholesterolemia    Osteoarthritis of left hip 04/07/2014   Reflux    Severe aortic stenosis    Spinal headache    with C-Section and with spinal fusion in 2019    Medications:  Infusions:   albumin human Stopped (06/14/23 2001)   heparin 750 Units/hr (06/19/23 1600)    Assessment: 68 yo female s/p ascending aortic root replacement, pharmacy asked to start IV heparin for new afib.  Planning possible pacemaker later this week so avoiding anticoagulation for now.  -Initial heparin level (~5h after initiation)  0.12, subtherapeutic on 750 units/hr -No issues noted  Goal of Therapy:  Heparin level 0.3-0.7 units/ml Monitor platelets by anticoagulation protocol: Yes   Plan:  Increase IV heparin to 950 units/hr. Check heparin level in 6 hrs. Daily heparin level and CBC. F/u plans for oral anticoagulation eventually.  Trixie Rude, PharmD Clinical Pharmacist 06/19/2023  5:16 PM

## 2023-06-19 NOTE — Progress Notes (Incomplete)
  Patient Name: [name] Date of Encounter: 06/20/2023  Primary Cardiologist: Charlton Haws, MD Electrophysiologist: None  Interval Summary   The patient is doing well today.  At this time, the patient denies chest pain, shortness of breath, or any new concerns.  Vital Signs    Vitals:   06/20/23 0635 06/20/23 0700 06/20/23 0808 06/20/23 1100  BP: (!) 114/49 (!) 106/50 (!) 144/79   Pulse: 94 95 97   Resp: 16 (!) 6 13   Temp:  98.3 F (36.8 C)  97.6 F (36.4 C)  TempSrc:  Oral  Axillary  SpO2: 100% 99% 91%   Weight:      Height:        Intake/Output Summary (Last 24 hours) at 06/20/2023 1458 Last data filed at 06/20/2023 1018 Gross per 24 hour  Intake 484.11 ml  Output 850 ml  Net -365.89 ml   Filed Weights   06/18/23 0500 06/19/23 0500 06/20/23 0500  Weight: 91.1 kg 90.2 kg 89.4 kg    Physical Exam    GEN- The patient is well appearing, alert and oriented x 3 today.   Lungs- Clear to ausculation bilaterally, normal work of breathing Cardiac- {Blank single:19197::"Regular","Irregularly irregular"} rate and rhythm, no murmurs, rubs or gallops GI- soft, NT, ND, + BS Extremities- no clubbing or cyanosis. No edema  Telemetry    *** (personally reviewed)  Studies:  Intra-Op TEE 06/14/23 > LV unchanged from pre-bypass  Hospital Course    [name] is a 68 y.o. female with hx of severe AS, HTN, HLD, DM II, asthma admitted 06/14/23 for AV & aortic root replacement.    Assessment & Plan    [facility] s/p [place] Procedure  POD #7. Aortic root replacement with 21mm Connect Pericardial Aortic valve conduit  -underlying rhythm *** -fever defervesced *** -leukocytosis resolved  -NPO for possible PPM pending MD review   Atrial Flutter  Noted 1/26 0600  -heparin gtt in anticipation for procedure  -anticipate Eliquis for discharge    Fever, Enterococcus Faecalis in Sputum  -abx per PCCM  -blood cultures negative to date     For questions or updates,  please contact [facility] Please consult [email or web address] for contact info under Cardiology/STEMI.  Signed, Canary Brim, NP-C, AGACNP-BC [facility] - Electrophysiology  06/20/2023, 2:58 PM

## 2023-06-19 NOTE — Progress Notes (Signed)
Pt crying hysterically, states potassium hurts so bad. Turned off. She only received 10ml. Contacted pharmacy and changed to PO.Patient states she is refusing to continue potassium . Patient states she is refusing any form of potassium, I encouraged her that it can be changed to po. States she will try it .  STE noted in lead 3, EKG obtained. Contacted [facility] to review

## 2023-06-20 ENCOUNTER — Inpatient Hospital Stay (HOSPITAL_COMMUNITY): Payer: Medicare HMO

## 2023-06-20 DIAGNOSIS — A498 Other bacterial infections of unspecified site: Secondary | ICD-10-CM | POA: Diagnosis not present

## 2023-06-20 DIAGNOSIS — Z952 Presence of prosthetic heart valve: Secondary | ICD-10-CM | POA: Diagnosis not present

## 2023-06-20 DIAGNOSIS — I10 Essential (primary) hypertension: Secondary | ICD-10-CM | POA: Diagnosis not present

## 2023-06-20 DIAGNOSIS — R739 Hyperglycemia, unspecified: Secondary | ICD-10-CM | POA: Diagnosis not present

## 2023-06-20 LAB — GLUCOSE, CAPILLARY
Glucose-Capillary: 126 mg/dL — ABNORMAL HIGH (ref 70–99)
Glucose-Capillary: 116 mg/dL — ABNORMAL HIGH (ref 70–99)
Glucose-Capillary: 133 mg/dL — ABNORMAL HIGH (ref 70–99)
Glucose-Capillary: 182 mg/dL — ABNORMAL HIGH (ref 70–99)
Glucose-Capillary: 95 mg/dL (ref 70–99)

## 2023-06-20 LAB — HEPARIN LEVEL (UNFRACTIONATED)
Heparin Unfractionated: 0.13 [IU]/mL — ABNORMAL LOW (ref 0.30–0.70)
Heparin Unfractionated: 0.13 [IU]/mL — ABNORMAL LOW (ref 0.30–0.70)
Heparin Unfractionated: 0.23 [IU]/mL — ABNORMAL LOW (ref 0.30–0.70)

## 2023-06-20 MED ORDER — MAGNESIUM SULFATE 2 GM/50ML IV SOLN
2.0000 g | Freq: Once | INTRAVENOUS | Status: AC
  Administered 2023-06-20: 2 g via INTRAVENOUS
  Filled 2023-06-20: qty 50

## 2023-06-20 MED ORDER — FUROSEMIDE 10 MG/ML IJ SOLN
20.0000 mg | Freq: Once | INTRAMUSCULAR | Status: AC
  Administered 2023-06-20: 20 mg via INTRAVENOUS
  Filled 2023-06-20: qty 2

## 2023-06-20 MED ORDER — AMOXICILLIN-POT CLAVULANATE 875-125 MG PO TABS
1.0000 | ORAL_TABLET | Freq: Two times a day (BID) | ORAL | Status: DC
  Administered 2023-06-20 – 2023-06-24 (×9): 1 via ORAL
  Filled 2023-06-20 (×10): qty 1

## 2023-06-20 NOTE — Progress Notes (Signed)
PHARMACY - ANTICOAGULATION CONSULT NOTE  Pharmacy Consult for IV heparin Indication: atrial fibrillation  Allergies  Allergen Reactions   Crestor [Rosuvastatin] Other (See Comments)    Myalgia; patient can only tolerate taking 10 mg every other day   Robaxin [Methocarbamol] Other (See Comments)    Insomnia   Toradol [Ketorolac Tromethamine] Other (See Comments)   Zocor [Simvastatin] Other (See Comments)    Myalgias    Lipitor [Atorvastatin] Other (See Comments)    Myalgia   Sulfa Antibiotics Hives    Patient Measurements: Height: 4\' 9"  (144.8 cm) Weight: 89.4 kg (197 lb 1.5 oz) IBW/kg (Calculated) : 38.6 Heparin Dosing Weight: ~ 60 kg  Vital Signs: Temp: 98.2 F (36.8 C) (01/27 1500) Temp Source: Oral (01/27 1500) BP: 103/51 (01/27 1800) Pulse Rate: 94 (01/27 1800)  Labs: Recent Labs    06/18/23 0202 06/19/23 0335 06/19/23 1618 06/19/23 2032 06/20/23 0046 06/20/23 0949 06/20/23 1746  HGB 9.5* 9.8*  --   --   --   --   --   HCT 29.8* 31.7*  --   --   --   --   --   PLT 204 234  --   --   --   --   --   HEPARINUNFRC  --   --    < >  --  0.13* 0.13* 0.23*  CREATININE 1.20* 1.00  --  1.03*  --   --   --    < > = values in this interval not displayed.    Estimated Creatinine Clearance: 50 mL/min (A) (by C-G formula based on SCr of 1.03 mg/dL (H)).   Medical History: Past Medical History:  Diagnosis Date   Allergy    Anemia    Anxiety    Arthritis    Asthma    BMI 40.0-44.9, adult (HCC) 04/07/2014   DM type 2 (diabetes mellitus, type 2) (HCC)    GERD (gastroesophageal reflux disease)    HTN (hypertension)    Hypercholesterolemia    Osteoarthritis of left hip 04/07/2014   Reflux    Severe aortic stenosis    Spinal headache    with C-Section and with spinal fusion in 2019      Assessment: 68 yo female s/p ascending aortic root replacement, pharmacy asked to start IV heparin for new afib.  Planning possible pacemaker later this week so avoiding  anticoagulation for now.  Heparin level 0.23 is subtherapeutic on 1250 units/hr.  No issues with infusion or bleeding per RN.   Goal of Therapy:  Heparin level 0.3-0.7 units/ml Monitor platelets by anticoagulation protocol: Yes   Plan:  Increase IV heparin to 1400 units/hr. Monitor daily heparin level, CBC, signs/symptoms of bleeding  F/u plans for oral anticoagulation eventually.   Alphia Moh, PharmD, BCPS, BCCP Clinical Pharmacist  Please check AMION for all [facility] phone numbers After 10:00 PM, call Main Pharmacy [phone]

## 2023-06-20 NOTE — Plan of Care (Signed)

## 2023-06-20 NOTE — Progress Notes (Addendum)
6 Days Post-Op Procedure(s) (LRB): ASCENDING AORTIC ROOT REPLACEMENT USING KONECT RESILIA AORTIC VALVE CONDUIT SIZE AND REATTACHMENT OF RIGHT AND LEFT CORONARY (N/A) TRANSESOPHAGEAL ECHOCARDIOGRAM (TEE) (N/A) Subjective: Feels better overall  Objective: Vital signs in last 24 hours: Temp:  [98 F (36.7 C)-99 F (37.2 C)] 98 F (36.7 C) (01/27 0000) Pulse Rate:  [69-144] 94 (01/27 0635) Cardiac Rhythm: Atrial fibrillation (01/27 0400) Resp:  [5-28] 16 (01/27 0635) BP: (83-162)/(47-102) 114/49 (01/27 0635) SpO2:  [93 %-100 %] 100 % (01/27 0635) Weight:  [89.4 kg] 89.4 kg (01/27 0500)  Hemodynamic parameters for last 24 hours:    Intake/Output from previous day: 01/26 0701 - 01/27 0700 In: 445.2 [I.V.:445.2] Out: 1350 [Urine:1350] Intake/Output this shift: No intake/output data recorded.  General appearance: alert, cooperative, and no distress Heart: regular rate and rhythm Lungs: dim in lower fields Abdomen: benign Extremities: no edema Wound: incis healing well  Lab Results: Recent Labs    06/18/23 0202 06/19/23 0335  WBC 14.0* 10.5  HGB 9.5* 9.8*  HCT 29.8* 31.7*  PLT 204 234   BMET:  Recent Labs    06/19/23 0335 06/19/23 2032  NA 140 144  K 4.0 3.7  CL 102 102  CO2 28 27  GLUCOSE 127* 139*  BUN 27* 21  CREATININE 1.00 1.03*  CALCIUM 8.9 9.2    PT/INR: No results for input(s): "LABPROT", "INR" in the last 72 hours. ABG    Component Value Date/Time   PHART 7.313 (L) 06/14/2023 1958   HCO3 26.5 06/14/2023 1958   TCO2 28 06/14/2023 1958   ACIDBASEDEF 3.0 (H) 06/14/2023 1813   O2SAT 98 06/14/2023 1958   CBG (last 3)  Recent Labs    06/19/23 1142 06/19/23 1534 06/19/23 2354  GLUCAP 113* 107* 126*    Meds Scheduled Meds:  amLODipine  10 mg Oral QHS   aspirin EC  325 mg Oral Daily   Or   aspirin  324 mg Per Tube Daily   bisacodyl  10 mg Oral Daily   Or   bisacodyl  10 mg Rectal Daily   Chlorhexidine Gluconate Cloth  6 each Topical  Daily   docusate sodium  200 mg Oral Daily   ezetimibe  10 mg Oral Daily   feeding supplement (GLUCERNA SHAKE)  237 mL Oral BID BM   insulin aspart  0-15 Units Subcutaneous TID WC   losartan  25 mg Oral Daily   potassium chloride  20 mEq Oral Q4H   rosuvastatin  10 mg Oral QODAY   sodium chloride flush  10-40 mL Intracatheter Q12H   sodium chloride flush  3 mL Intravenous Q12H   Continuous Infusions:  amiodarone 30 mg/hr (06/19/23 2356)   heparin 1,100 Units/hr (06/20/23 0221)   PRN Meds:.dextrose, loratadine, ondansetron (ZOFRAN) IV, mouth rinse, oxyCODONE, sodium chloride flush, sodium chloride flush, traMADol  Xrays No results found.  Recent Results (from the past 240 hours)  Culture, blood (Routine X 2) w Reflex to ID Panel     Status: None (Preliminary result)   Collection Time: 06/17/23 12:45 PM   Specimen: BLOOD  Result Value Ref Range Status   Specimen Description BLOOD SITE NOT SPECIFIED  Final   Special Requests   Final    BOTTLES DRAWN AEROBIC AND ANAEROBIC Blood Culture results may not be optimal due to an inadequate volume of blood received in culture bottles   Culture   Final    NO GROWTH 2 DAYS Performed at [facility], [address] [place], [place] [address]    Report Status PENDING  Incomplete  Culture, blood (Routine X 2) w Reflex to ID Panel     Status: None (Preliminary result)   Collection Time: 06/17/23 12:47 PM   Specimen: BLOOD  Result Value Ref Range Status   Specimen Description BLOOD SITE NOT SPECIFIED  Final   Special Requests   Final    BOTTLES DRAWN AEROBIC AND ANAEROBIC Blood Culture results may not be optimal due to an inadequate volume of blood received in culture bottles   Culture   Final    NO GROWTH 2 DAYS Performed at [facility], [address] [place], [place] [address]    Report Status PENDING  Incomplete  Expectorated Sputum Assessment w Gram Stain, Rflx to Resp Cult     Status: None   Collection Time: 06/17/23   1:20 PM   Specimen: Sputum  Result Value Ref Range Status   Specimen Description SPU  Final   Special Requests NONE  Final   Sputum evaluation   Final    THIS SPECIMEN IS ACCEPTABLE FOR SPUTUM CULTURE Performed at [facility], [address] [place], [place] [address]    Report Status 06/18/2023 FINAL  Final  Culture, Respiratory w Gram Stain     Status: None (Preliminary result)   Collection Time: 06/17/23  1:20 PM   Specimen: Sputum  Result Value Ref Range Status   Specimen Description SPU  Final   Special Requests NONE Reflexed from [id number]  Final   Gram Stain   Final    RARE WBC PRESENT, PREDOMINANTLY PMN FEW GRAM POSITIVE COCCI FEW GRAM NEGATIVE RODS RARE BUDDING YEAST SEEN    Culture   Final    ABUNDANT ENTEROCOCCUS FAECALIS SUSCEPTIBILITIES TO FOLLOW Performed at [facility], [address] [place], [place] [address]    Report Status PENDING  Incomplete    Assessment/Plan: S/P Procedure(s) (LRB): ASCENDING AORTIC ROOT REPLACEMENT USING KONECT RESILIA AORTIC VALVE CONDUIT SIZE AND REATTACHMENT OF RIGHT AND LEFT CORONARY (N/A) TRANSESOPHAGEAL ECHOCARDIOGRAM (TEE) (N/A) POD#6  1 afeb, Tmax 99, , S BP 83-169, afib w/ RVR at times,  HR 69-144- om amio gtt and heparin gtt, now in sinus so pacer in play for tachy - brady, EP conts to consult 2 sats good on 2 liters [place] 3 weight now at preop 4 good UOP 5 BS well controlled- likely resume glucophage prior to or at d/c 6 creat has significantly improved from peak AKI 2.01, now 1.03, BUN normal- results form yesterday, likely repeat in next day or so- will hold on further diuresis for now 7 enterococcus in sputum, WBC normal yesterday - will defer abx to PCCM- will get CXR 8 cont pulm hygiene and rehab modalities   LOS: 6 days    Olivia Clack PA-C Pager [phone] 06/20/2023   Agree with above Back in sinus after multiple amio bolus overnight No plans for PPM per EP Will watch in unit one more  day  [name] O Anaiz Qazi

## 2023-06-20 NOTE — Progress Notes (Addendum)
Chart/tele check note Pt developed AFlutter w/RVR last night >>>  Initial EKG appears VPacing/fusing  >> #2 more clearly AFlutter with RVR Landed on amiodarone and heparin gtts   She is POD #6 today This morning she appears to be ins SR conducting 1:1 with rates 90's ON AMIODARONE Epicardial wire RV threshold is 1.5 and left at 60bpm/97mA  No labs this AM Yesterday WBC wnl Fever curve improving > Tmax last 24hours 99 Respiratory culture + enterococcus BC from 06/17/23 neg to so far (x2)  No plans for pacing today EKGs, telemetry reviewed with Dr. Roselyn Bering to be SR conducting 1:1, will get EKG May not need pacing Continue amiodarone Leave back up pacing at VVI 60 for now Suspect home with a monitor will be the plan pending the next day or so   Francis Dowse, PA-C

## 2023-06-20 NOTE — Progress Notes (Signed)
NAME:  [name], MRN:  [id number], DOB:  1956-01-09, LOS: 6 ADMISSION DATE:  06/14/2023, CONSULTATION DATE:  06/14/2023 REFERRING MD:  Tereso Newcomer, CHIEF COMPLAINT:  post-operative management   History of Present Illness:  68 year old female with past medical history of asthma, T2DM, HTN, HLD, severe aortic stenosis who presents for aortic valve replacement, possible aortic root replacement.  Patient had recent admission to [facility] 04/27/23-04/30/23 for progressive dyspnea requiring 4LNC. Appeared to be FVO. She was diuresed and eventually weaned off nitroglycerin gtt for concomitant hypertensive emergency. She had echo which showed EF 60-65%, no RWMA, G2DD, moderate AVR, severe AV stenosis. She had follow-up with outpatient cardiology and TCTS.   Intraoperatively, received AVR and root replacement. To ICU from OR on Epi, NE, dex, propofol, insulin. Getting albumin. X1 CT.   Pump time: 2h 38m Xclamp time: 2h 49m EBL: 476cc Cell saver: 230cc 2UPRBC  Pertinent  Medical History  T2DM, HTN, HFpEF, HLD, asthma, aortic stenosis   Significant Hospital Events: Including procedures, antibiotic start and stop dates in addition to other pertinent events   1/21: s/p AVR, aortic root replacement to ICU from OR post-operatively  1/22: Lasix 40mg  IV. NAEON. Extubated yesterday  1/26 rapid AF symptomatic. Amiodarone drip re-started.   Interim History / Subjective:  No complaints Doing well walking.  Diuresing well Palpitations yesterday, rapid A fib, controlled with new amio drip.   Objective   Blood pressure (!) 114/49, pulse 94, temperature 98 F (36.7 C), resp. rate 16, height 4\' 9"  (1.448 m), weight 89.4 kg, SpO2 100%.        Intake/Output Summary (Last 24 hours) at 06/20/2023 0717 Last data filed at 06/20/2023 0600 Gross per 24 hour  Intake 445.23 ml  Output 1350 ml  Net -904.77 ml   Filed Weights   06/18/23 0500 06/19/23 0500 06/20/23 0500  Weight: 91.1 kg 90.2 kg 89.4 kg     Examination:  General: Adult female resting comfortably in bedside chair.  HENT: [place]/AT, PERRL, no JVD Lungs: Breathing well on 2L. Sats high 90s. Clear.  Cardiovascular: Regular, rate 94 Abdomen: Soft, NT, ND Extremities: Trace pedal edema. Neuro: Alert, oriented, non-focal Derm: Grossly intact.   BUN 21 Cr 1.03 K 3.7 WBC 10.5 H/H 9.8/31.7 Platelets 234 Resp culture: Abundant E. Faecalis >>> Blood cultures NGTD  Resolved Hospital Problem list   Post-operative vent management  Post bypass vasoplegia   Assessment & Plan:  Severe aortic stenosis s/p AVR (bioprosthetic) Severe aortic root calcification s/p aortic root replacement  Expected post-operative ABLA Expected post-operative consumptive thrombocytopenia-resolved Severe AS with moderate AVR now s/p replacement. Intraoperatively found to have significant aortic root calcifications and proceeded with root replacement.  - Tolerating gentle diuresis well. Will dose an additional 20mg  lasix today. (She was on 40mg  daily at home prior to surgery). Replacing K as well.  - Pacer per EP -Continue aspirin daily - Pain control per protocol - Continue progressive mobility  New onset atrial fibrillation - Heparin infusion continue for AF. Can eventually go back to eliquis.  - RVR episode 1/26 > amio drip restarted.  - Per EP  Fevers; has had discolored sputum raising concern for pneumonia. Positive sputum culture: E. Faecalis. Susceptibilities pending. Afebrile, WBC improving -Reassuring that her blood cultures have been negative so far - hold ABX - Monitor clinically.   Complete Heart Block -  - Eventually needs pacemaker, need to ensure that she is fever free.  Diabetes, minimal insulin requirements - Sliding scale insulin as needed -  Goal blood glucose <180  Hypertension  -Continue amlodipine, losartan -Holding beta-blockers and all AV nodal blocking meds.  Hyperlipidemia  -con't crestor  AKI on CKD 3b,  improved - Strict I's/O - Renally dose meds and avoid nephrotoxic meds - Monitor  Remain in ICU temporary pacemaker dependence   Best Practice (right click and "Reselect all SmartList Selections" daily)   Diet/type: Regular consistency (see orders) DVT prophylaxis: LMWH Pressure ulcer(s): none GI prophylaxis: N/A Lines: N/A Foley:  N/A Code Status:  full code Last date of multidisciplinary goals of care discussion [per primary]  Critical care time:       Joneen Roach, AGACNP-BC [facility]  See Amion for personal pager PCCM on call pager [phone] until 7pm. Please call [facility] 7p-7a. [phone]  06/20/2023 7:30 AM

## 2023-06-20 NOTE — Progress Notes (Signed)
PHARMACY - ANTICOAGULATION CONSULT NOTE  Pharmacy Consult for IV heparin Indication: atrial fibrillation  Allergies  Allergen Reactions   Crestor [Rosuvastatin] Other (See Comments)    Myalgia; patient can only tolerate taking 10 mg every other day   Robaxin [Methocarbamol] Other (See Comments)    Insomnia   Toradol [Ketorolac Tromethamine] Other (See Comments)   Zocor [Simvastatin] Other (See Comments)    Myalgias    Lipitor [Atorvastatin] Other (See Comments)    Myalgia   Sulfa Antibiotics Hives    Patient Measurements: Height: 4\' 9"  (144.8 cm) Weight: 90.2 kg (198 lb 13.7 oz) IBW/kg (Calculated) : 38.6 Heparin Dosing Weight: ~ 60 kg  Vital Signs: Temp: 98 F (36.7 C) (01/27 0000) Temp Source: Oral (01/26 2000) BP: 125/73 (01/27 0100) Pulse Rate: 81 (01/27 0100)  Labs: Recent Labs    06/17/23 0450 06/18/23 0202 06/19/23 0335 06/19/23 1618 06/19/23 2032 06/20/23 0046  HGB 9.0* 9.5* 9.8*  --   --   --   HCT 29.0* 29.8* 31.7*  --   --   --   PLT 171 204 234  --   --   --   HEPARINUNFRC  --   --   --  0.12*  --  0.13*  CREATININE 1.83* 1.20* 1.00  --  1.03*  --     Estimated Creatinine Clearance: 50.2 mL/min (A) (by C-G formula based on SCr of 1.03 mg/dL (H)).   Medical History: Past Medical History:  Diagnosis Date   Allergy    Anemia    Anxiety    Arthritis    Asthma    BMI 40.0-44.9, adult (HCC) 04/07/2014   DM type 2 (diabetes mellitus, type 2) (HCC)    GERD (gastroesophageal reflux disease)    HTN (hypertension)    Hypercholesterolemia    Osteoarthritis of left hip 04/07/2014   Reflux    Severe aortic stenosis    Spinal headache    with C-Section and with spinal fusion in 2019    Medications:  Infusions:   albumin human Stopped (06/14/23 2001)   amiodarone 60 mg/hr (06/19/23 2139)   Followed by   amiodarone 30 mg/hr (06/19/23 2356)   heparin 950 Units/hr (06/19/23 2001)    Assessment: 68 yo female s/p ascending aortic root  replacement, pharmacy asked to start IV heparin for new afib.  Planning possible pacemaker later this week so avoiding anticoagulation for now.  -Initial heparin level (~5h after initiation) 0.12, subtherapeutic on 750 units/hr -No issues noted  1/27 AM update:  Heparin level sub-therapeutic  Goal of Therapy:  Heparin level 0.3-0.7 units/ml Monitor platelets by anticoagulation protocol: Yes   Plan:  Increase IV heparin to 1100 units/hr. Check heparin level in 6-8 hrs. Daily heparin level and CBC. F/u plans for oral anticoagulation eventually.  Abran Duke, PharmD, BCPS Clinical Pharmacist Phone: [phone]

## 2023-06-20 NOTE — Progress Notes (Signed)
      [address]       [place] [address]             [phone]    POD # 6   Resting comfortably  BP (!) 144/79   Pulse 97   Temp 98.2 F (36.8 C) (Oral)   Resp 13   Ht 4\' 9"  (1.448 m)   Wt 89.4 kg   SpO2 91%   BMI 42.65 kg/m   In SR on amiodarone   Intake/Output Summary (Last 24 hours) at 06/20/2023 1715 Last data filed at 06/20/2023 1500 Gross per 24 hour  Intake 649.7 ml  Output 1150 ml  Net -500.3 ml   Continue current Rx  Ory Elting C. Dorris Fetch, MD [facility] [phone]

## 2023-06-20 NOTE — Progress Notes (Signed)
PHARMACY - ANTICOAGULATION CONSULT NOTE  Pharmacy Consult for IV heparin Indication: atrial fibrillation  Allergies  Allergen Reactions   Crestor [Rosuvastatin] Other (See Comments)    Myalgia; patient can only tolerate taking 10 mg every other day   Robaxin [Methocarbamol] Other (See Comments)    Insomnia   Toradol [Ketorolac Tromethamine] Other (See Comments)   Zocor [Simvastatin] Other (See Comments)    Myalgias    Lipitor [Atorvastatin] Other (See Comments)    Myalgia   Sulfa Antibiotics Hives    Patient Measurements: Height: 4\' 9"  (144.8 cm) Weight: 89.4 kg (197 lb 1.5 oz) IBW/kg (Calculated) : 38.6 Heparin Dosing Weight: ~ 60 kg  Vital Signs: Temp: 97.6 F (36.4 C) (01/27 1100) Temp Source: Axillary (01/27 1100) BP: 144/79 (01/27 0808) Pulse Rate: 97 (01/27 0808)  Labs: Recent Labs    06/18/23 0202 06/19/23 0335 06/19/23 1618 06/19/23 2032 06/20/23 0046 06/20/23 0949  HGB 9.5* 9.8*  --   --   --   --   HCT 29.8* 31.7*  --   --   --   --   PLT 204 234  --   --   --   --   HEPARINUNFRC  --   --  0.12*  --  0.13* 0.13*  CREATININE 1.20* 1.00  --  1.03*  --   --     Estimated Creatinine Clearance: 50 mL/min (A) (by C-G formula based on SCr of 1.03 mg/dL (H)).   Medical History: Past Medical History:  Diagnosis Date   Allergy    Anemia    Anxiety    Arthritis    Asthma    BMI 40.0-44.9, adult (HCC) 04/07/2014   DM type 2 (diabetes mellitus, type 2) (HCC)    GERD (gastroesophageal reflux disease)    HTN (hypertension)    Hypercholesterolemia    Osteoarthritis of left hip 04/07/2014   Reflux    Severe aortic stenosis    Spinal headache    with C-Section and with spinal fusion in 2019    Medications:  Infusions:   amiodarone 30 mg/hr (06/20/23 1132)   heparin 1,100 Units/hr (06/20/23 0800)   magnesium sulfate bolus IVPB 2 g (06/20/23 1130)    Assessment: 68 yo female s/p ascending aortic root replacement, pharmacy asked to start IV heparin  for new afib.  Planning possible pacemaker later this week so avoiding anticoagulation for now.  Heparin level is subtherapeutic at 0.13 on UFH IV at 1100 units/hour. CBC is stable with no signs of bleeding or bruising.  Goal of Therapy:  Heparin level 0.3-0.7 units/ml Monitor platelets by anticoagulation protocol: Yes   Plan:  Increase IV heparin to 1250 units/hr. Check heparin level in 6 hrs. Daily heparin level and CBC. F/u plans for oral anticoagulation eventually.  Wilmer Floor, PharmD PGY2 Cardiology Pharmacy Resident

## 2023-06-21 ENCOUNTER — Inpatient Hospital Stay (HOSPITAL_COMMUNITY): Payer: Medicare HMO

## 2023-06-21 ENCOUNTER — Other Ambulatory Visit (HOSPITAL_COMMUNITY): Payer: Self-pay

## 2023-06-21 ENCOUNTER — Encounter (HOSPITAL_COMMUNITY): Payer: Medicare HMO

## 2023-06-21 DIAGNOSIS — I4891 Unspecified atrial fibrillation: Secondary | ICD-10-CM | POA: Diagnosis not present

## 2023-06-21 DIAGNOSIS — Z952 Presence of prosthetic heart valve: Secondary | ICD-10-CM | POA: Diagnosis not present

## 2023-06-21 DIAGNOSIS — R739 Hyperglycemia, unspecified: Secondary | ICD-10-CM | POA: Diagnosis not present

## 2023-06-21 DIAGNOSIS — I10 Essential (primary) hypertension: Secondary | ICD-10-CM | POA: Diagnosis not present

## 2023-06-21 LAB — CBC
HCT: 30.2 % — ABNORMAL LOW (ref 36.0–46.0)
Hemoglobin: 9.3 g/dL — ABNORMAL LOW (ref 12.0–15.0)
MCH: 24.9 pg — ABNORMAL LOW (ref 26.0–34.0)
MCHC: 30.8 g/dL (ref 30.0–36.0)
MCV: 80.7 fL (ref 80.0–100.0)
Platelets: 264 10*3/uL (ref 150–400)
RBC: 3.74 MIL/uL — ABNORMAL LOW (ref 3.87–5.11)
RDW: 17.8 % — ABNORMAL HIGH (ref 11.5–15.5)
WBC: 13.2 10*3/uL — ABNORMAL HIGH (ref 4.0–10.5)
nRBC: 0.2 % (ref 0.0–0.2)

## 2023-06-21 LAB — GLUCOSE, CAPILLARY
Glucose-Capillary: 111 mg/dL — ABNORMAL HIGH (ref 70–99)
Glucose-Capillary: 142 mg/dL — ABNORMAL HIGH (ref 70–99)
Glucose-Capillary: 103 mg/dL — ABNORMAL HIGH (ref 70–99)
Glucose-Capillary: 125 mg/dL — ABNORMAL HIGH (ref 70–99)
Glucose-Capillary: 143 mg/dL — ABNORMAL HIGH (ref 70–99)

## 2023-06-21 LAB — MAGNESIUM: Magnesium: 1.8 mg/dL (ref 1.7–2.4)

## 2023-06-21 LAB — URIC ACID: Uric Acid, Serum: 8.5 mg/dL — ABNORMAL HIGH (ref 2.5–7.1)

## 2023-06-21 LAB — BASIC METABOLIC PANEL
Anion gap: 10 (ref 5–15)
BUN: 14 mg/dL (ref 8–23)
CO2: 28 mmol/L (ref 22–32)
Calcium: 9 mg/dL (ref 8.9–10.3)
Chloride: 104 mmol/L (ref 98–111)
Creatinine, Ser: 0.93 mg/dL (ref 0.44–1.00)
GFR, Estimated: >60 mL/min (ref >60)
Glucose, Bld: 113 mg/dL — ABNORMAL HIGH (ref 70–99)
Potassium: 4.4 mmol/L (ref 3.5–5.1)
Sodium: 142 mmol/L (ref 135–145)

## 2023-06-21 LAB — HEPARIN LEVEL (UNFRACTIONATED)
Heparin Unfractionated: 0.33 [IU]/mL (ref 0.30–0.70)
Heparin Unfractionated: 0.33 [IU]/mL (ref 0.30–0.70)

## 2023-06-21 MED ORDER — SODIUM CHLORIDE 0.9 % IV SOLN: 250.0000 mL | INTRAVENOUS | Status: DC | PRN

## 2023-06-21 MED ORDER — AMIODARONE HCL 200 MG PO TABS
400.0000 mg | ORAL_TABLET | Freq: Two times a day (BID) | ORAL | Status: DC
  Administered 2023-06-21 – 2023-06-24 (×7): 400 mg via ORAL
  Filled 2023-06-21 (×7): qty 2

## 2023-06-21 MED ORDER — ACETAMINOPHEN 325 MG PO TABS
650.0000 mg | ORAL_TABLET | ORAL | Status: DC | PRN
  Administered 2023-06-21: 650 mg via ORAL
  Filled 2023-06-21: qty 2

## 2023-06-21 MED ORDER — PNEUMOCOCCAL 20-VAL CONJ VACC 0.5 ML IM SUSY
0.5000 mL | PREFILLED_SYRINGE | INTRAMUSCULAR | Status: DC
  Filled 2023-06-21: qty 0.5

## 2023-06-21 MED ORDER — SODIUM CHLORIDE 0.9% FLUSH: 3.0000 mL | INTRAVENOUS | Status: DC | PRN

## 2023-06-21 MED ORDER — SODIUM CHLORIDE 0.9% FLUSH
3.0000 mL | Freq: Two times a day (BID) | INTRAVENOUS | Status: DC
  Administered 2023-06-21 (×2): 3 mL via INTRAVENOUS

## 2023-06-21 MED ORDER — FUROSEMIDE 40 MG PO TABS
40.0000 mg | ORAL_TABLET | Freq: Every day | ORAL | Status: DC
  Administered 2023-06-21 – 2023-06-23 (×3): 40 mg via ORAL
  Filled 2023-06-21 (×3): qty 1

## 2023-06-21 MED ORDER — PREDNISONE 20 MG PO TABS
40.0000 mg | ORAL_TABLET | Freq: Every day | ORAL | Status: AC
  Administered 2023-06-21 – 2023-06-23 (×3): 40 mg via ORAL
  Filled 2023-06-21 (×3): qty 2

## 2023-06-21 MED ORDER — MAGNESIUM SULFATE 4 GM/100ML IV SOLN
4.0000 g | Freq: Once | INTRAVENOUS | Status: AC
  Administered 2023-06-21: 4 g via INTRAVENOUS
  Filled 2023-06-21: qty 100

## 2023-06-21 MED ORDER — ~~LOC~~ CARDIAC SURGERY, PATIENT & FAMILY EDUCATION: Freq: Once | Status: AC

## 2023-06-21 NOTE — Progress Notes (Addendum)
NAME:  [name], MRN:  [id number], DOB:  Oct 10, 1955, LOS: 7 ADMISSION DATE:  06/14/2023, CONSULTATION DATE:  06/14/2023 REFERRING MD:  Tereso Newcomer, CHIEF COMPLAINT:  post-operative management   History of Present Illness:  69 year old female with past medical history of asthma, T2DM, HTN, HLD, severe aortic stenosis who presents for aortic valve replacement, possible aortic root replacement.  Patient had recent admission to [facility] 04/27/23-04/30/23 for progressive dyspnea requiring 4LNC. Appeared to be FVO. She was diuresed and eventually weaned off nitroglycerin gtt for concomitant hypertensive emergency. She had echo which showed EF 60-65%, no RWMA, G2DD, moderate AVR, severe AV stenosis. She had follow-up with outpatient cardiology and TCTS.   Intraoperatively, received AVR and root replacement. To ICU from OR on Epi, NE, dex, propofol, insulin. Getting albumin. X1 CT.   Pump time: 2h 61m Xclamp time: 2h 30m EBL: 476cc Cell saver: 230cc 2UPRBC  Pertinent  Medical History  T2DM, HTN, HFpEF, HLD, asthma, aortic stenosis   Significant Hospital Events: Including procedures, antibiotic start and stop dates in addition to other pertinent events   1/21: s/p AVR, aortic root replacement to ICU from OR post-operatively  1/22: Lasix 40mg  IV. NAEON. Extubated yesterday  1/26 rapid AF symptomatic. Amiodarone drip re-started.   Interim History / Subjective:  E. Faecalis pan sensitive.  Outputs unmeasured, but almost certainly remains negative.  Afebrile C/o L ankle and leg pain while walking today.   Objective   Blood pressure (!) 141/87, pulse 96, temperature 98.3 F (36.8 C), temperature source Oral, resp. rate 11, height 4\' 9"  (1.448 m), weight 89.3 kg, SpO2 100%.        Intake/Output Summary (Last 24 hours) at 06/21/2023 1106 Last data filed at 06/21/2023 1000 Gross per 24 hour  Intake 786.51 ml  Output 600 ml  Net 186.51 ml   Filed Weights   06/19/23 0500 06/20/23 0500  06/21/23 0500  Weight: 90.2 kg 89.4 kg 89.3 kg    Examination:  General: overweight adult female in NAD HENT: /AT, PERRL, no JVD Lungs: clear bilateral breath sounds Cardiovascular: Regular rate and rhythm. No edema.  Abdomen: Soft, NT, ND Extremities: No acute deformity or ROM limitation. No ankle or knee edema to correlate with her pain.  Neuro: Alert, oriented, non-focal Derm: Grossly intact.   BUN 14 Cr 0.93 K 4.4 WBC 13.2 H/H 9.3/30.2 Platelets 234 Resp culture: Abundant E. Faecalis pan sensitive Blood cultures NGTD  Resolved Hospital Problem list   Post-operative vent management  Post bypass vasoplegia   Assessment & Plan:  Severe aortic stenosis s/p AVR (bioprosthetic) Severe aortic root calcification s/p aortic root replacement  Expected post-operative ABLA Expected post-operative consumptive thrombocytopenia-resolved Severe AS with moderate AVR now s/p replacement. Intraoperatively found to have significant aortic root calcifications and proceeded with root replacement.  - Diuresis per primary - Continue aspirin daily - Pain control per protocol - Continue progressive mobility  New onset atrial fibrillation - Heparin infusion continue for AF. Can eventually go back to eliquis.  - Amio infusion transitioned to PO 400 mg BID - Per EP  Fevers; has had discolored sputum raising concern for pneumonia. Positive sputum culture: E. Faecalis. Pan sensitive - Reassuring that her blood cultures have been negative so far - Augmentin - Monitor clinically.   Complete Heart Block - improved  Diabetes, minimal insulin requirements - Sliding scale insulin as needed - Goal blood glucose <180  Hypertension  -Continue amlodipine, losartan -Holding beta-blockers and all AV nodal blocking meds.  Hyperlipidemia  -  continue crestor  AKI on CKD 3b, improved - Strict I's/O - Renally dose meds and avoid nephrotoxic meds - Monitor  Transferring to floor. PCCM will  sign off.    Best Practice (right click and "Reselect all SmartList Selections" daily)   Diet/type: Regular consistency (see orders) DVT prophylaxis: LMWH Pressure ulcer(s): none GI prophylaxis: N/A Lines: N/A Foley:  N/A Code Status:  full code Last date of multidisciplinary goals of care discussion [per primary]  Critical care time:       Joneen Roach, AGACNP-BC [facility]  See Amion for personal pager PCCM on call pager [phone] until 7pm. Please call Elink 7p-7a. [phone]  06/21/2023 11:06 AM

## 2023-06-21 NOTE — TOC Benefit Eligibility Note (Signed)
Pharmacy Patient Advocate Encounter  Insurance verification completed.   The patient is insured through [employer] test claim for Eliquis. Currently a quantity of 60 is a 30 day supply and the co-pay is $297.00 .  Ran test claim for Xarelto. Currently a quantity of 30 is a 30 day supply and the co-pay is $297.00 .  Patient has $250 deductible that needs to be met.  This test claim was processed through [facility]- copay amounts may vary at other pharmacies due to pharmacy/plan contracts, or as the patient moves through the different stages of their insurance plan.

## 2023-06-21 NOTE — Progress Notes (Addendum)
PCCM INTERVAL PROGRESS NOTE  Notified of R ankle pain Mild pain at rest Tender to palpation Pain worse with dorsiflexion  Unable to fully bear weight and is limping when ambulating  Has had gout in the right foot previously This feels somewhat similar, but not totally.   Plan Doppler pending Will get Xray Start prednisone for gout and check uric acid   Joneen Roach, AGACNP-BC [facility]  See Amion for personal pager PCCM on call pager [phone] until 7pm. Please call Elink 7p-7a. [phone]  06/21/2023 3:02 PM

## 2023-06-21 NOTE — Evaluation (Signed)
Physical Therapy Evaluation Patient Details Name: [name] MRN: [id number] DOB: [birth year] Today's Date: 06/21/2023  History of Present Illness  Pt is a 68yo female who presented for aortic valve replacement completed on 1/21. PMH includes: recent removal of hardware at L3, L4, and S1 with PLIF of L3-4 on 8/23, anxiety, aortic stenosis, obesity, DM II, HTN, and osteoarthritis of L hip.   Clinical Impression  Pt admitted with above. Pt functioning at [facility] for bed mobility and contact guard for ambulation. Anticipate pt to progress well and be able to d/c home with use of RW and 24/7 support from husband. Acute PT to cont to follow while in hospital however I don't anticipate pt to need follow up PT upon d/c.         If plan is discharge home, recommend the following: A little help with walking and/or transfers;A little help with bathing/dressing/bathroom;Assist for transportation;Help with stairs or ramp for entrance   Can travel by private vehicle        Equipment Recommendations None recommended by PT  Recommendations for Other Services       Functional Status Assessment Patient has had a recent decline in their functional status and demonstrates the ability to make significant improvements in function in a reasonable and predictable amount of time.     Precautions / Restrictions Precautions Precautions: Sternal Precaution Booklet Issued: No Precaution Comments: pt able to state "stay in the tube" Restrictions Weight Bearing Restrictions Per Provider Order: Yes RUE Weight Bearing Per Provider Order: Non weight bearing LUE Weight Bearing Per Provider Order: Non weight bearing Other Position/Activity Restrictions: sternal precautions      Mobility  Bed Mobility Overal bed mobility: Needs Assistance Bed Mobility: Rolling, Sidelying to Sit, Sit to Supine Rolling: Min assist Sidelying to sit: Mod assist   Sit to supine: Mod assist   General bed mobility comments:  due to body habitus minA required to roll and modA for trunk elevation, pt held onto heart pillow    Transfers Overall transfer level: Needs assistance Equipment used: None Transfers: Sit to/from Stand Sit to Stand: Min assist           General transfer comment: educated on anterior rocking, pt held onto pillow, able to push up with LEs only    Ambulation/Gait Ambulation/Gait assistance: Contact guard assist, Min assist Gait Distance (Feet): 400 Feet Assistive device: Fara Boros Gait Pattern/deviations: Step-through pattern, Decreased stride length, Trunk flexed Gait velocity: slow     General Gait Details: verbal cues to minimize leaning/pushing down on walker, minA to slow down walker due to pt leaning heavily and on pushing to far forward  Stairs            Wheelchair Mobility     Tilt Bed    Modified Rankin (Stroke Patients Only)       Balance Overall balance assessment: Mild deficits observed, not formally tested                                           Pertinent Vitals/Pain Pain Assessment Pain Assessment: 0-10 Pain Score: 5  Pain Location: R ankle Pain Descriptors / Indicators: Sharp Pain Intervention(s): Other (comment) (told RN)    Home Living Family/patient expects to be discharged to:: Private residence Living Arrangements: Spouse/significant other Available Help at Discharge: Family;Available 24 hours/day Type of Home: House Home Access: Level entry  Alternate Level Stairs-Number of Steps: 13 Home Layout: Two level;Bed/bath upstairs Home Equipment: Rolling Walker (2 wheels);Rollator (4 wheels);Grab bars - tub/shower;Grab bars - toilet;Shower seat;Hand held shower head      Prior Function Prior Level of Function : Independent/Modified Independent             Mobility Comments: used a SPC in home and on stairs, used RW in community ADLs Comments: indep     Extremity/Trunk Assessment   Upper Extremity  Assessment Upper Extremity Assessment: Overall WFL for tasks assessed    Lower Extremity Assessment Lower Extremity Assessment: Overall WFL for tasks assessed    Cervical / Trunk Assessment Cervical / Trunk Assessment: Other exceptions Cervical / Trunk Exceptions: sternal incisino  Communication   Communication Communication: No apparent difficulties  Cognition Arousal: Alert Behavior During Therapy: WFL for tasks assessed/performed Overall Cognitive Status: Within Functional Limits for tasks assessed                                          General Comments General comments (skin integrity, edema, etc.): VSS, SpO2 > 96% on RA    Exercises     Assessment/Plan    PT Assessment Patient needs continued PT services  PT Problem List Decreased balance;Decreased mobility;Decreased activity tolerance       PT Treatment Interventions DME instruction;Gait training;Stair training;Functional mobility training;Therapeutic activities;Therapeutic exercise;Balance training    PT Goals (Current goals can be found in the Care Plan section)  Acute Rehab PT Goals Patient Stated Goal: home PT Goal Formulation: With patient Time For Goal Achievement: 07/05/23 Potential to Achieve Goals: Good    Frequency Min 1X/week     Co-evaluation               AM-PAC PT "6 Clicks" Mobility  Outcome Measure Help needed turning from your back to your side while in a flat bed without using bedrails?: A Little Help needed moving from lying on your back to sitting on the side of a flat bed without using bedrails?: A Little Help needed moving to and from a bed to a chair (including a wheelchair)?: A Little Help needed standing up from a chair using your arms (e.g., wheelchair or bedside chair)?: A Little Help needed to walk in hospital room?: A Little Help needed climbing 3-5 steps with a railing? : A Lot 6 Click Score: 17    End of Session   Activity Tolerance: Patient  tolerated treatment well Patient left: in bed;with call bell/phone within reach;with family/visitor present Nurse Communication: Mobility status PT Visit Diagnosis: Unsteadiness on feet (R26.81);Difficulty in walking, not elsewhere classified (R26.2)    Time: 9528-4132 PT Time Calculation (min) (ACUTE ONLY): 29 min   Charges:   PT Evaluation $PT Eval Low Complexity: 1 Low PT Treatments $Gait Training: 8-22 mins PT General Charges $$ ACUTE PT VISIT: 1 Visit         Lewis Shock, PT, DPT Acute Rehabilitation Services Secure chat preferred Office #: [phone]   Iona Hansen 06/21/2023, 3:01 PM

## 2023-06-21 NOTE — Progress Notes (Signed)
EP Tele Review:    Tele: SR 90 - ST 105, PR prolonged ~231ms / 1AVB. Conducting 1:1.    Noted WBC up to 13.2k, sputum with enterococcus, BC NGTD.  Afebrile. Note patient was previously on scheduled tylenol.     EP will continue to follow peripherally.      Canary Brim, NP-C, AGACNP-BC [facility] - Electrophysiology  06/21/2023, 4:33 PM

## 2023-06-21 NOTE — Progress Notes (Addendum)
PHARMACY - ANTICOAGULATION CONSULT NOTE  Pharmacy Consult for IV heparin Indication: atrial fibrillation  Allergies  Allergen Reactions   Crestor [Rosuvastatin] Other (See Comments)    Myalgia; patient can only tolerate taking 10 mg every other day   Robaxin [Methocarbamol] Other (See Comments)    Insomnia   Toradol [Ketorolac Tromethamine] Other (See Comments)   Zocor [Simvastatin] Other (See Comments)    Myalgias    Lipitor [Atorvastatin] Other (See Comments)    Myalgia   Sulfa Antibiotics Hives    Patient Measurements: Height: 4\' 9"  (144.8 cm) Weight: 89.3 kg (196 lb 13.9 oz) IBW/kg (Calculated) : 38.6 Heparin Dosing Weight: ~ 60 kg  Vital Signs: Temp: 98.3 F (36.8 C) (01/28 0800) Temp Source: Oral (01/28 0800) BP: 118/64 (01/28 0800) Pulse Rate: 99 (01/28 0800)  Labs: Recent Labs    06/19/23 0335 06/19/23 1618 06/19/23 2032 06/20/23 0046 06/20/23 0949 06/20/23 1746 06/21/23 0230 06/21/23 0721  HGB 9.8*  --   --   --   --   --  9.3*  --   HCT 31.7*  --   --   --   --   --  30.2*  --   PLT 234  --   --   --   --   --  264  --   HEPARINUNFRC  --    < >  --    < > 0.13* 0.23*  --  0.33  CREATININE 1.00  --  1.03*  --   --   --  0.93  --    < > = values in this interval not displayed.    Estimated Creatinine Clearance: 55.3 mL/min (by C-G formula based on SCr of 0.93 mg/dL).   Medical History: Past Medical History:  Diagnosis Date   Allergy    Anemia    Anxiety    Arthritis    Asthma    BMI 40.0-44.9, adult (HCC) 04/07/2014   DM type 2 (diabetes mellitus, type 2) (HCC)    GERD (gastroesophageal reflux disease)    HTN (hypertension)    Hypercholesterolemia    Osteoarthritis of left hip 04/07/2014   Reflux    Severe aortic stenosis    Spinal headache    with C-Section and with spinal fusion in 2019      Assessment: 68 yo female s/p ascending aortic root replacement, pharmacy asked to start IV heparin for new afib.  Planning possible pacemaker  later this week so avoiding anticoagulation for now.  Heparin level 0.33 is therapeutic on 14000 units/hr.  No issues with infusion or bleeding per RN. CBC is stable this morning.   Goal of Therapy:  Heparin level 0.3-0.7 units/ml Monitor platelets by anticoagulation protocol: Yes   Plan:  Continue IV heparin at 1400 units/hr. Monitor 6-hour confirmatory heparin level. Monitor daily heparin level, CBC, signs/symptoms of bleeding  F/u plans for oral anticoagulation eventually.  Wilmer Floor, PharmD PGY2 Cardiology Pharmacy Resident  Please check AMION for all [facility] Pharmacy phone numbers After 10:00 PM, call Main Pharmacy [phone]   UPDATE 05/25/22 15:24: Heparin level is therapeutic at 0.33 on UFH IV infusion at 1400 units/hour. Patient remains stable with no signs of bleeding.  Continue IV UFH at 1400 units/hour Monitor 6-hour confirmatory heparin level. Monitor daily heparin level, CBC, signs/symptoms of bleeding  F/u plans for oral anticoagulation eventually.  Wilmer Floor, PharmD PGY2 Cardiology Pharmacy Resident

## 2023-06-21 NOTE — Progress Notes (Addendum)
7 Days Post-Op Procedure(s) (LRB): ASCENDING AORTIC ROOT REPLACEMENT USING KONECT RESILIA AORTIC VALVE CONDUIT SIZE AND REATTACHMENT OF RIGHT AND LEFT CORONARY (N/A) TRANSESOPHAGEAL ECHOCARDIOGRAM (TEE) (N/A) Subjective: Feels ok  Objective: Vital signs in last 24 hours: Temp:  [97.6 F (36.4 C)-98.9 F (37.2 C)] 98.9 F (37.2 C) (01/28 0400) Pulse Rate:  [87-117] 91 (01/28 0400) Cardiac Rhythm: Normal sinus rhythm;Heart block (01/28 0400) Resp:  [5-42] 7 (01/28 0400) BP: (83-144)/(39-109) 105/59 (01/28 0400) SpO2:  [91 %-100 %] 92 % (01/28 0400) Weight:  [89.3 kg] 89.3 kg (01/28 0500)  Hemodynamic parameters for last 24 hours:    Intake/Output from previous day: 01/27 0701 - 01/28 0700 In: 670.2 [I.V.:651.5; IV Piggyback:18.7] Out: 750 [Urine:750] Intake/Output this shift: No intake/output data recorded.  General appearance: alert, cooperative, and no distress Heart: regular rate and rhythm and rachy Lungs: mildly dim in bases Abdomen: benign Extremities: no edema Wound: incis healing well  Lab Results: Recent Labs    06/19/23 0335 06/21/23 0230  WBC 10.5 13.2*  HGB 9.8* 9.3*  HCT 31.7* 30.2*  PLT 234 264   BMET:  Recent Labs    06/19/23 2032 06/21/23 0230  NA 144 142  K 3.7 4.4  CL 102 104  CO2 27 28  GLUCOSE 139* 113*  BUN 21 14  CREATININE 1.03* 0.93  CALCIUM 9.2 9.0    PT/INR: No results for input(s): "LABPROT", "INR" in the last 72 hours. ABG    Component Value Date/Time   PHART 7.313 (L) 06/14/2023 1958   HCO3 26.5 06/14/2023 1958   TCO2 28 06/14/2023 1958   ACIDBASEDEF 3.0 (H) 06/14/2023 1813   O2SAT 98 06/14/2023 1958   CBG (last 3)  Recent Labs    06/20/23 1630 06/20/23 2229 06/21/23 0611  GLUCAP 182* 95 111*    Meds Scheduled Meds:  amLODipine  10 mg Oral QHS   amoxicillin-clavulanate  1 tablet Oral Q12H   aspirin EC  325 mg Oral Daily   Or   aspirin  324 mg Per Tube Daily   bisacodyl  10 mg Oral Daily   Or    bisacodyl  10 mg Rectal Daily   Chlorhexidine Gluconate Cloth  6 each Topical Daily   docusate sodium  200 mg Oral Daily   ezetimibe  10 mg Oral Daily   feeding supplement (GLUCERNA SHAKE)  237 mL Oral BID BM   insulin aspart  0-15 Units Subcutaneous TID WC   losartan  25 mg Oral Daily   rosuvastatin  10 mg Oral QODAY   sodium chloride flush  10-40 mL Intracatheter Q12H   sodium chloride flush  3 mL Intravenous Q12H   Continuous Infusions:  amiodarone 30 mg/hr (06/21/23 0400)   heparin 1,400 Units/hr (06/21/23 0626)   magnesium sulfate bolus IVPB     PRN Meds:.dextrose, loratadine, ondansetron (ZOFRAN) IV, mouth rinse, oxyCODONE, sodium chloride flush, sodium chloride flush, traMADol  Xrays DG Chest 1 View Result Date: 06/20/2023 CLINICAL DATA:  Postop aortic valve replacement. EXAM: CHEST  1 VIEW COMPARISON:  Radiographs 06/16/2023 and 06/15/2023.  CT 04/15/2023. FINDINGS: 0839 hours. The right IJ sheath and mediastinal drains have been removed in the interval. The heart size and mediastinal contours are stable status post recent median sternotomy and aortic valve replacement. There is improved aeration of both lung bases with mild residual bibasilar atelectasis and probable small pleural effusions. No evidence of pneumothorax or definite edema. IMPRESSION: Improved aeration of both lung bases with mild residual bibasilar atelectasis  and probable small pleural effusions. No evidence of pneumothorax. Electronically Signed   By: Carey Bullocks M.D.   On: 06/20/2023 12:29   Recent Results (from the past 240 hours)  Culture, blood (Routine X 2) w Reflex to ID Panel     Status: None (Preliminary result)   Collection Time: 06/17/23 12:45 PM   Specimen: BLOOD  Result Value Ref Range Status   Specimen Description BLOOD SITE NOT SPECIFIED  Final   Special Requests   Final    BOTTLES DRAWN AEROBIC AND ANAEROBIC Blood Culture results may not be optimal due to an inadequate volume of blood  received in culture bottles   Culture   Final    NO GROWTH 3 DAYS Performed at [facility], [address], [place], [place] [address]    Report Status PENDING  Incomplete  Culture, blood (Routine X 2) w Reflex to ID Panel     Status: None (Preliminary result)   Collection Time: 06/17/23 12:47 PM   Specimen: BLOOD  Result Value Ref Range Status   Specimen Description BLOOD SITE NOT SPECIFIED  Final   Special Requests   Final    BOTTLES DRAWN AEROBIC AND ANAEROBIC Blood Culture results may not be optimal due to an inadequate volume of blood received in culture bottles   Culture   Final    NO GROWTH 3 DAYS Performed at [facility], [address], [place], [place] [address]    Report Status PENDING  Incomplete  Expectorated Sputum Assessment w Gram Stain, Rflx to Resp Cult     Status: None   Collection Time: 06/17/23  1:20 PM   Specimen: Sputum  Result Value Ref Range Status   Specimen Description SPU  Final   Special Requests NONE  Final   Sputum evaluation   Final    THIS SPECIMEN IS ACCEPTABLE FOR SPUTUM CULTURE Performed at [facility], [address], [place], [place] [address]    Report Status 06/18/2023 FINAL  Final  Culture, Respiratory w Gram Stain     Status: None (Preliminary result)   Collection Time: 06/17/23  1:20 PM   Specimen: Sputum  Result Value Ref Range Status   Specimen Description SPU  Final   Special Requests NONE Reflexed from [id number]  Final   Gram Stain   Final    RARE WBC PRESENT, PREDOMINANTLY PMN FEW GRAM POSITIVE COCCI FEW GRAM NEGATIVE RODS RARE BUDDING YEAST SEEN    Culture   Final    ABUNDANT ENTEROCOCCUS FAECALIS CULTURE REINCUBATED FOR BETTER GROWTH Performed at [facility], [address], [place], [place] [address]    Report Status PENDING  Incomplete   Organism ID, Bacteria ENTEROCOCCUS FAECALIS  Final      Susceptibility   Enterococcus faecalis - MIC*    AMPICILLIN <=2 SENSITIVE Sensitive     VANCOMYCIN  1 SENSITIVE Sensitive     GENTAMICIN SYNERGY SENSITIVE Sensitive     * ABUNDANT ENTEROCOCCUS FAECALIS    Assessment/Plan: S/P Procedure(s) (LRB): ASCENDING AORTIC ROOT REPLACEMENT USING KONECT RESILIA AORTIC VALVE CONDUIT SIZE AND REATTACHMENT OF RIGHT AND LEFT CORONARY (N/A) TRANSESOPHAGEAL ECHOCARDIOGRAM (TEE) (N/A) POD#7  1 afeb, s BP 80's-140's, SR/ST 1 deg AVblock, amio and heparin gtts, EP assisting w/ rhythm management 2 O2 sats good on 2 liters 3 ID- on augmentin - enterococcus in sputum, increased leukocytosis, CXR improving aeration, small effus 4 UOP appears good- not all measured, weight about same- normal renal  fxn 5 H/H stable expected AB:A 6 routine rehab and pulm hygiene- will get PT consult to assist w/ home planning needs    LOS: 7 days    Rowe Clack PA-C Pager [phone] 06/21/2023  Agree with above Floor today PO amio  Chenay Nesmith O Geraldina Parrott

## 2023-06-21 NOTE — TOC Progression Note (Addendum)
Transition of Care [facility]) - Progression Note    Patient Details  Name: [name] MRN: [id number] Date of Birth: 29-Sep-1955  Transition of Care [facility]) CM/SW Contact  Elliot Cousin, RN Phone Number: [phone] 06/21/2023, 10:16 AM  Clinical Narrative:    TOC CM spoke to pt at bedside. States her husband at home all day to assist with her care. Discussed IP rehab vs HH. Pt states she is leaning more towards dc home with HH. (Medicare.gov list with ratings placed on chart and given to patient. ). Pt agreeable to [facility] for HH. Contacted rep, Tresa Endo with new referral. Contacted Mitch with [employer] for [employer] for home. They will deliver tomorrow to stay within 48 hour dc window. Pt states she has a RW and bedside commode at home that was given to her by family. Husband will provide transportation home.  Will continue to follow for dc needs. HH orders and DME orders in EPIC.   Pt requested CM arrange her PCP follow up appt, appt arranged with Veverly Fells on 2/13 at 2 pm.    Expected Discharge Plan: Home w Home Health Services Barriers to Discharge: Continued Medical Work up  Expected Discharge Plan and Services   Discharge Planning Services: CM Consult Post Acute Care Choice: Home Health Living arrangements for the past 2 months: Single Family Home                 DME Arranged: Walker rolling with seat DME Agency: [employer] Date DME Agency Contacted: 06/21/23 Time DME Agency Contacted: 1014 Representative spoke with at DME Agency: Mitch HH Arranged: RN, PT HH Agency: [facility] Date [facility] Agency Contacted: 06/21/23 Time HH Agency Contacted: 1015 Representative spoke with at [facility] Agency: Hassel Neth   Social Determinants of Health (SDOH) Interventions SDOH Screenings   Food Insecurity: No Food Insecurity (06/18/2023)  Housing: Unknown (06/18/2023)  Transportation Needs: No Transportation Needs (06/18/2023)  Utilities: Not At Risk (06/18/2023)   Depression (PHQ2-9): Low Risk  (04/26/2023)  Financial Resource Strain: Low Risk  (04/13/2023)   Received from [facility]  Physical Activity: Inactive (04/13/2023)   Received from [facility]  Social Connections: Unknown (06/18/2023)  Recent Concern: Social Connections - Somewhat Isolated (04/13/2023)   Received from [facility]  Stress: No Stress Concern Present (04/13/2023)   Received from [facility]  Tobacco Use: Low Risk  (05/24/2023)    Readmission Risk Interventions     No data to display

## 2023-06-22 ENCOUNTER — Other Ambulatory Visit: Payer: Self-pay

## 2023-06-22 ENCOUNTER — Inpatient Hospital Stay (HOSPITAL_COMMUNITY): Payer: Medicare HMO

## 2023-06-22 ENCOUNTER — Encounter (HOSPITAL_COMMUNITY): Payer: Self-pay | Admitting: Thoracic Surgery (Cardiothoracic Vascular Surgery)

## 2023-06-22 DIAGNOSIS — D72829 Elevated white blood cell count, unspecified: Secondary | ICD-10-CM

## 2023-06-22 DIAGNOSIS — I480 Paroxysmal atrial fibrillation: Secondary | ICD-10-CM | POA: Diagnosis not present

## 2023-06-22 DIAGNOSIS — I10 Essential (primary) hypertension: Secondary | ICD-10-CM | POA: Diagnosis not present

## 2023-06-22 DIAGNOSIS — M25572 Pain in left ankle and joints of left foot: Secondary | ICD-10-CM

## 2023-06-22 DIAGNOSIS — M79604 Pain in right leg: Secondary | ICD-10-CM

## 2023-06-22 LAB — GLUCOSE, CAPILLARY
Glucose-Capillary: 160 mg/dL — ABNORMAL HIGH (ref 70–99)
Glucose-Capillary: 172 mg/dL — ABNORMAL HIGH (ref 70–99)
Glucose-Capillary: 136 mg/dL — ABNORMAL HIGH (ref 70–99)
Glucose-Capillary: 132 mg/dL — ABNORMAL HIGH (ref 70–99)
Glucose-Capillary: 125 mg/dL — ABNORMAL HIGH (ref 70–99)

## 2023-06-22 LAB — CBC
HCT: 30.9 % — ABNORMAL LOW (ref 36.0–46.0)
Hemoglobin: 9.7 g/dL — ABNORMAL LOW (ref 12.0–15.0)
MCH: 25.3 pg — ABNORMAL LOW (ref 26.0–34.0)
MCHC: 31.4 g/dL (ref 30.0–36.0)
MCV: 80.5 fL (ref 80.0–100.0)
Platelets: 300 10*3/uL (ref 150–400)
RBC: 3.84 MIL/uL — ABNORMAL LOW (ref 3.87–5.11)
RDW: 17.9 % — ABNORMAL HIGH (ref 11.5–15.5)
WBC: 15.8 10*3/uL — ABNORMAL HIGH (ref 4.0–10.5)
nRBC: 0.2 % (ref 0.0–0.2)

## 2023-06-22 LAB — CULTURE, RESPIRATORY W GRAM STAIN

## 2023-06-22 LAB — BASIC METABOLIC PANEL
Anion gap: 11 (ref 5–15)
BUN: 17 mg/dL (ref 8–23)
CO2: 27 mmol/L (ref 22–32)
Calcium: 9 mg/dL (ref 8.9–10.3)
Chloride: 99 mmol/L (ref 98–111)
Creatinine, Ser: 1.1 mg/dL — ABNORMAL HIGH (ref 0.44–1.00)
GFR, Estimated: 55 mL/min — ABNORMAL LOW (ref >60)
Glucose, Bld: 166 mg/dL — ABNORMAL HIGH (ref 70–99)
Potassium: 4.3 mmol/L (ref 3.5–5.1)
Sodium: 137 mmol/L (ref 135–145)

## 2023-06-22 LAB — CULTURE, BLOOD (ROUTINE X 2)
Culture: NO GROWTH
Culture: NO GROWTH

## 2023-06-22 LAB — HEPARIN LEVEL (UNFRACTIONATED): Heparin Unfractionated: 0.39 [IU]/mL (ref 0.30–0.70)

## 2023-06-22 LAB — MAGNESIUM: Magnesium: 2.3 mg/dL (ref 1.7–2.4)

## 2023-06-22 MED ORDER — OXYCODONE HCL 5 MG PO TABS
5.0000 mg | ORAL_TABLET | Freq: Four times a day (QID) | ORAL | Status: DC | PRN
Start: 2023-06-22 — End: 2023-06-24
  Administered 2023-06-24: 10 mg via ORAL
  Filled 2023-06-22: qty 2

## 2023-06-22 MED ORDER — METOPROLOL TARTRATE 12.5 MG HALF TABLET
12.5000 mg | ORAL_TABLET | Freq: Two times a day (BID) | ORAL | Status: DC
  Administered 2023-06-22 – 2023-06-24 (×5): 12.5 mg via ORAL
  Filled 2023-06-22 (×5): qty 1

## 2023-06-22 MED ORDER — ASPIRIN 81 MG PO CHEW
81.0000 mg | CHEWABLE_TABLET | Freq: Every day | ORAL | Status: DC
  Administered 2023-06-22 – 2023-06-24 (×3): 81 mg via ORAL
  Filled 2023-06-22 (×3): qty 1

## 2023-06-22 MED ORDER — APIXABAN 5 MG PO TABS
5.0000 mg | ORAL_TABLET | Freq: Two times a day (BID) | ORAL | Status: DC
  Administered 2023-06-22 – 2023-06-24 (×5): 5 mg via ORAL
  Filled 2023-06-22 (×5): qty 1

## 2023-06-22 MED ORDER — APIXABAN 5 MG PO TABS: 5.0000 mg | ORAL_TABLET | Freq: Two times a day (BID) | ORAL | Status: DC

## 2023-06-22 NOTE — Progress Notes (Addendum)
EP Telemetry Review   Tele: SR with 1AVB, 80-105 bpm, episode of NSVT noted   WBC increased to 15.8, BC negative, afebrile but pt did receive tylenol on 1/28 at 2002.    EP will continue to follow peripherally.   Canary Brim, NP-C, AGACNP-BC [facility] - Electrophysiology  06/22/2023, 9:53 AM

## 2023-06-22 NOTE — Progress Notes (Signed)
Epicardial pacing wires removed by this RN. Minimal resistance met and patient reported slight discomfort during removal. Atrial and ventricular wires intact upon removal. Vital signs stable. Care ongoing.

## 2023-06-22 NOTE — Progress Notes (Signed)
PHARMACY - ANTICOAGULATION CONSULT NOTE  Pharmacy Consult for apixiban Indication: atrial fibrillation  Allergies  Allergen Reactions   Crestor [Rosuvastatin] Other (See Comments)    Myalgia; patient can only tolerate taking 10 mg every other day   Robaxin [Methocarbamol] Other (See Comments)    Insomnia   Toradol [Ketorolac Tromethamine] Other (See Comments)   Zocor [Simvastatin] Other (See Comments)    Myalgias    Lipitor [Atorvastatin] Other (See Comments)    Myalgia   Sulfa Antibiotics Hives    Patient Measurements: Height: 4\' 9"  (144.8 cm) Weight: 89.2 kg (196 lb 10.4 oz) IBW/kg (Calculated) : 38.6 Heparin Dosing Weight: ~ 60 kg  Vital Signs: Temp: 98.2 F (36.8 C) (01/29 0734) Temp Source: Oral (01/29 0734) BP: 142/81 (01/29 0600) Pulse Rate: 101 (01/29 0600)  Labs: Recent Labs    06/19/23 2032 06/20/23 0046 06/21/23 0230 06/21/23 0721 06/21/23 1419 06/22/23 0210  HGB  --   --  9.3*  --   --  9.7*  HCT  --   --  30.2*  --   --  30.9*  PLT  --   --  264  --   --  300  HEPARINUNFRC  --    < >  --  0.33 0.33 0.39  CREATININE 1.03*  --  0.93  --   --  1.10*   < > = values in this interval not displayed.    Estimated Creatinine Clearance: 46.7 mL/min (A) (by C-G formula based on SCr of 1.1 mg/dL (H)).   Medical History: Past Medical History:  Diagnosis Date   Allergy    Anemia    Anxiety    Arthritis    Asthma    BMI 40.0-44.9, adult (HCC) 04/07/2014   DM type 2 (diabetes mellitus, type 2) (HCC)    GERD (gastroesophageal reflux disease)    HTN (hypertension)    Hypercholesterolemia    Osteoarthritis of left hip 04/07/2014   Reflux    Severe aortic stenosis    Spinal headache    with C-Section and with spinal fusion in 2019      Assessment: 68 yo female s/p ascending aortic root replacement, pharmacy asked to start IV heparin for new afib. She will transition to DOAC therapy today. Heparin drip discontinued.   Goal of Therapy:  Heparin  level 0.3-0.7 units/ml Monitor platelets by anticoagulation protocol: Yes   Plan:  Apixiban 5 mg BID Monitor daily heparin level, CBC, signs/symptoms of bleeding   Wilmer Floor, PharmD PGY2 Cardiology Pharmacy Resident  Please check AMION for all [facility] Pharmacy phone numbers After 10:00 PM, call Main Pharmacy [phone]   UPDATE 05/25/22 15:24: Heparin level is therapeutic at 0.33 on UFH IV infusion at 1400 units/hour. Patient remains stable with no signs of bleeding.  Continue IV UFH at 1400 units/hour Monitor 6-hour confirmatory heparin level. Monitor daily heparin level, CBC, signs/symptoms of bleeding  F/u plans for oral anticoagulation eventually.  Wilmer Floor, PharmD PGY2 Cardiology Pharmacy Resident

## 2023-06-22 NOTE — Progress Notes (Signed)
Lower extremity venous duplex completed. Please see CV Procedures for preliminary results.  Shona Simpson, RVT 06/22/23 11:33 AM

## 2023-06-22 NOTE — Evaluation (Signed)
Occupational Therapy Evaluation Patient Details Name: [name] MRN: [id number] DOB: [birth year] Today's Date: 06/22/2023   History of Present Illness Pt is a 68yo female who presented for aortic valve replacement completed on 1/21. PMH includes: recent removal of hardware at L3, L4, and S1 with PLIF of L3-4 on 8/23, anxiety, aortic stenosis, obesity, DM II, HTN, and osteoarthritis of L hip.   Clinical Impression   At baseline, pt is Independent to Mod I with ADLs and IADLs and performs functional mobility with Mod I with a SPC in the home and RW in the community. 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 At baseline, pt receives assistance from her husband for home management tasks as needed. Pt now presents with decreased activity tolerance, generalized B UE weakness worse on R (tested within sternal precautions), decreased knowledge of compensatory strategies to adhere to sternal precautions during ADLs, decreased balance, and decreased safety and independence with functional tasks. Pt currently demonstrates ability to complete UB ADLs Independent to Contact guard assist with cues, LB ADLs with Min to Mod assist with cues, and functional step-pivot transfers with +1 hand held assist with Contact guard assist, all while adhering to sternal precautions. Pt's VSS on RA throughout session. Pt will benefit from acute skilled OT services to address deficits outlined below and to increase safety and independence with functional tasks. No post acute skilled OT needs are anticipated at this time.       If plan is discharge home, recommend the following: A little help with walking and/or transfers;A lot of help with bathing/dressing/bathroom;Assistance with cooking/housework;Assist for transportation;Help with stairs or ramp for entrance    Functional Status Assessment  Patient has had a recent decline in their functional status and demonstrates the ability to make significant improvements in  function in a reasonable and predictable amount of time.  Equipment Recommendations  None recommended by OT (Pt already has needed equipment)    Recommendations for Other Services       Precautions / Restrictions Precautions Precautions: Sternal Precaution Booklet Issued: No Precaution Comments: Pt able to verbally state and demonstrate understanding of sternal precautions without cues throughout session on 06/22/23. Restrictions Weight Bearing Restrictions Per Provider Order: Yes RUE Weight Bearing Per Provider Order: Non weight bearing LUE Weight Bearing Per Provider Order: Non weight bearing Other Position/Activity Restrictions: sternal precautions      Mobility Bed Mobility Overal bed mobility: Needs Assistance Bed Mobility: Rolling, Sidelying to Sit, Sit to Sidelying Rolling: Supervision (with increased time; hugging heart pillow) Sidelying to sit: Supervision, HOB elevated (with increased time; hugging heart pillow)     Sit to sidelying: Mod assist (Mod assist to elevate B LE into bed; with increased time; hugging heart pillow) General bed mobility comments: Pt with good carryover of and understanding of sternal precautions throughout session    Transfers Overall transfer level: Needs assistance Equipment used: None, 1 person hand held assist Transfers: Sit to/from Stand, Bed to chair/wheelchair/BSC Sit to Stand: Supervision, Contact guard assist (close Supervision to CGA for safety; hugging heart pillow; without an AD)     Step pivot transfers: Contact guard assist (+1 hand held assist)     General transfer comment: Pt with good carryover of training from prior PT session in anterior rocking and pushing through legs to stand whild holding heart pillow      Balance Overall balance assessment: Mild deficits observed, not formally tested  ADL either performed or assessed with clinical judgement   ADL Overall  ADL's : Needs assistance/impaired Eating/Feeding: Independent;Sitting   Grooming: Set up;Sitting;Cueing for compensatory techniques (cues for compensatory strategies to adhere to sternal precautions)   Upper Body Bathing: Contact guard assist;Sitting;Cueing for compensatory techniques (cues for compensatory strategies to adhere to sternal precautions)   Lower Body Bathing: Minimal assistance;Moderate assistance;Sit to/from stand;Cueing for compensatory techniques (cues for compensatory strategies to adhere to sternal precautions)   Upper Body Dressing : Set up;Supervision/safety;Cueing for compensatory techniques;Sitting (cues for compensatory strategies to adhere to sternal precautions)   Lower Body Dressing: Minimal assistance;Moderate assistance;Sit to/from stand;Sitting/lateral leans;Cueing for compensatory techniques (cues for compensatory strategies to adhere to sternal precautions)   Toilet Transfer: Contact guard assist (+1 hand held assist; step-pivot transfer) Toilet Transfer Details (indicate cue type and reason): simulated at EOB Toileting- Clothing Manipulation and Hygiene: Moderate assistance;Cueing for compensatory techniques;Sit to/from stand (cues for compensatory strategies to adhere to sternal precautions)       Functional mobility during ADLs:  (Functional mobility deferred this session due to pt preparing to be transferred to a differne tunit. OT to assess funcitonal mobility next session.) General ADL Comments: Pt presents with decreased activity tolerance     Vision Baseline Vision/History: 0 No visual deficits Ability to See in Adequate Light: 0 Adequate Patient Visual Report: No change from baseline       Perception         Praxis         Pertinent Vitals/Pain Pain Assessment Pain Assessment: 0-10 Pain Score: 3  Pain Location: R ankle/foot (Pt reports hx of occasional numbness, tingling, and ache/soreness in shoulder and hands, but not present this  session.) Pain Descriptors / Indicators: Sharp, Aching Pain Intervention(s): Monitored during session, Repositioned, Other (comment) (RN notified)     Extremity/Trunk Assessment Upper Extremity Assessment Upper Extremity Assessment: Right hand dominant;Generalized weakness (tested within sternal precautions. Pt reports hx of occasional numbness, tingling, and ache/soreness in shoulder and hands, but not present this session.)   Lower Extremity Assessment Lower Extremity Assessment: Defer to PT evaluation   Cervical / Trunk Assessment Cervical / Trunk Assessment: Other exceptions Cervical / Trunk Exceptions: sternal incision; hx of back surgeries   Communication Communication Communication: No apparent difficulties   Cognition Arousal: Alert Behavior During Therapy: WFL for tasks assessed/performed Overall Cognitive Status: Within Functional Limits for tasks assessed                                 General Comments: AAOx4 and pleasant throughout session. Able to follow multi-step instructions consistently and demonstrates good safety awareness and good insight into deficits. Pt also demonstrates good carryover to training in sternal precautions from training in prior PT session.     General Comments  VSS on RA throughout session. Pt's husband present throughout session.    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  Available Help at Discharge: Family;Available 24 hours/day Type of Home: House Home Access: Level entry     Home Layout: Two level;Bed/bath upstairs Alternate Level Stairs-Number of Steps: 13 Alternate Level Stairs-Rails: Left Bathroom Shower/Tub: Producer, television/film/video: Standard     Home Equipment: Agricultural consultant (2 wheels);Rollator (4  wheels);Grab bars - tub/shower;Grab bars -  toilet;Shower seat;Hand held shower head;Cane - single point          Prior Functioning/Environment Prior Level of Function : Independent/Modified Independent;Driving             Mobility Comments: At baseline, pt performs funcitonal mobility with Mod I and uses a SPC in home and on stairs, used RW in community ADLs Comments: At baseline, pt completes ADLs and IADLs Independent to Mod I and drives. Pt's husband assists with home management tasks PRN. 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        OT Problem List: Decreased strength;Decreased activity tolerance;Impaired balance (sitting and/or standing);Decreased knowledge of use of DME or AE;Decreased knowledge of precautions      OT Treatment/Interventions: Self-care/ADL training;Energy conservation;DME and/or AE instruction;Therapeutic activities;Balance training;Patient/family education    OT Goals(Current goals can be found in the care plan section) Acute Rehab OT Goals Patient Stated Goal: to return home and remain independent OT Goal Formulation: With patient Time For Goal Achievement: 07/06/23 Potential to Achieve Goals: Good ADL Goals Pt Will Perform Grooming: with modified independence;standing (adhering to sternal precautions; with adaptive equipment as needed) Pt Will Perform Upper Body Bathing: with modified independence;sitting (adhering to sternal precautions; with adaptive equipment as needed) Pt Will Perform Lower Body Bathing: with supervision;sitting/lateral leans;sit to/from stand (adhering to sternal precautions; with adaptive equipment as needed) Pt Will Perform Upper Body Dressing: with modified independence;sitting (adhering to sternal precautions; with adaptive equipment as needed) Pt Will Perform Lower Body Dressing: with supervision;sitting/lateral leans;sit to/from stand (adhering to sternal precautions; with adaptive equipment as needed) Pt Will Transfer  to Toilet: with modified independence;regular height toilet;ambulating (with least restrictive AD; while adhering to sternal precautions) Pt Will Perform Toileting - Clothing Manipulation and hygiene: with modified independence;sitting/lateral leans;sit to/from stand (adhering to sternal precautions; with adaptive equipment as needed) Additional ADL Goal #1: Patient will demonstrate ability to Independently state 4 energy conservation strategies to increase safety and independence with functional tasks in the home.  OT Frequency: Min 1X/week    Co-evaluation              AM-PAC OT "6 Clicks" Daily Activity     Outcome Measure Help from another person eating meals?: None Help from another person taking care of personal grooming?: A Little Help from another person toileting, which includes using toliet, bedpan, or urinal?: A Lot Help from another person bathing (including washing, rinsing, drying)?: A Lot Help from another person to put on and taking off regular upper body clothing?: A Little Help from another person to put on and taking off regular lower body clothing?: A Lot 6 Click Score: 16   End of Session Nurse Communication: Mobility status;Other (comment) (Pt reports pain in R ankle/foot)  Activity Tolerance: Patient tolerated treatment well Patient left: in bed;with call bell/phone within reach;with family/visitor present  OT Visit Diagnosis: Muscle weakness (generalized) (M62.81);Other (comment) (decreased activity tolerance)                Time: 1610-9604 OT Time Calculation (min): 19 min Charges:  OT General Charges $OT Visit: 1 Visit OT Evaluation $OT Eval Low Complexity: 1 Low  Mahum Betten "Kyle" M., OTR/L, MA Acute Rehab [phone]  Lendon Colonel 06/22/2023, 12:11 PM

## 2023-06-22 NOTE — Progress Notes (Signed)
Patient brought to 4E from 2H. VSS. Telemetry box applied, CCMD notified. Patient oriented to room and staff. Husband present. Call bell in reach.  Kenard Gower, RN

## 2023-06-22 NOTE — Progress Notes (Signed)
[address]       [place] [address]             [phone]      8 Days Post-Op  Procedure(s) (LRB): ASCENDING AORTIC ROOT REPLACEMENT USING KONECT RESILIA AORTIC VALVE CONDUIT SIZE AND REATTACHMENT OF RIGHT AND LEFT CORONARY (N/A) TRANSESOPHAGEAL ECHOCARDIOGRAM (TEE) (N/A)   Total Length of Stay:  LOS: 8 days    SUBJECTIVE: Feels well Has been NSR but a bit tachy Vitals:   06/22/23 0500 06/22/23 0600  BP: 139/79 (!) 142/81  Pulse: (!) 102 (!) 101  Resp: 17 (!) 37  Temp:    SpO2: 96% 94%    Intake/Output      01/28 0701 01/29 0700 01/29 0701 01/30 0700   I.V. (mL/kg) 330.7 (3.7)    IV Piggyback 97.7    Total Intake(mL/kg) 428.4 (4.8)    Urine (mL/kg/hr)     Total Output     Net +428.4         Urine Occurrence 2 x    Stool Occurrence 1 x        heparin 1,400 Units/hr (06/22/23 0400)    CBC    Component Value Date/Time   WBC 15.8 (H) 06/22/2023 0210   RBC 3.84 (L) 06/22/2023 0210   HGB 9.7 (L) 06/22/2023 0210   HGB 10.4 (L) 03/29/2023 0946   HCT 30.9 (L) 06/22/2023 0210   HCT 34.7 03/29/2023 0946   PLT 300 06/22/2023 0210   PLT 313 03/29/2023 0946   MCV 80.5 06/22/2023 0210   MCV 79 03/29/2023 0946   MCH 25.3 (L) 06/22/2023 0210   MCHC 31.4 06/22/2023 0210   RDW 17.9 (H) 06/22/2023 0210   RDW 16.6 (H) 03/29/2023 0946   LYMPHSABS 2.1 04/28/2023 0246   LYMPHSABS 2.1 10/06/2017 0927   MONOABS 0.6 04/28/2023 0246   EOSABS 0.3 04/28/2023 0246   EOSABS 0.1 10/06/2017 0927   BASOSABS 0.1 04/28/2023 0246   BASOSABS 0.0 10/06/2017 0927   CMP     Component Value Date/Time   NA 137 06/22/2023 0210   NA 142 03/29/2023 0946   K 4.3 06/22/2023 0210   CL 99 06/22/2023 0210   CO2 27 06/22/2023 0210   GLUCOSE 166 (H) 06/22/2023 0210   BUN 17 06/22/2023 0210   BUN 22 03/29/2023 0946   CREATININE 1.10 (H) 06/22/2023 0210   CREATININE 1.40 (H) 04/26/2023 1053   CALCIUM 9.0 06/22/2023 0210   PROT 7.0 06/14/2023 0626    PROT 7.2 02/03/2023 0847   ALBUMIN 3.8 06/14/2023 0626   ALBUMIN 4.5 02/03/2023 0847   AST 22 06/14/2023 0626   ALT 21 06/14/2023 0626   ALKPHOS 69 06/14/2023 0626   BILITOT 0.4 06/14/2023 0626   BILITOT <0.2 02/03/2023 0847   GFRNONAA 55 (L) 06/22/2023 0210   GFRNONAA 83 11/12/2015 1436   GFRAA >60 02/01/2018 1703   GFRAA >89 11/12/2015 1436   ABG    Component Value Date/Time   PHART 7.313 (L) 06/14/2023 1958   PCO2ART 52.4 (H) 06/14/2023 1958   PO2ART 110 (H) 06/14/2023 1958   HCO3 26.5 06/14/2023 1958   TCO2 28 06/14/2023 1958   ACIDBASEDEF 3.0 (H) 06/14/2023 1813   O2SAT 98 06/14/2023 1958   CBG (last 3)  Recent Labs    06/21/23 1114 06/21/23 1726 06/21/23 2147  GLUCAP 103* 125* 143*  EXAM Lungs: clear Card: RR with soft murmur Ext: warm Neuro: intact   ASSESSMENT: POD #  8 sp Aortic root replacement Hemodynamics ok. On po amio. Will add low dose bb. Stop heparin and start eliquis Pulm: on augmentin for positive sputum. WBC up but was started on prednisone. Need to determine why Renal: stable I believe can go to floor today    Eugenio Hoes, MD 06/22/2023

## 2023-06-22 NOTE — Progress Notes (Signed)
NAME:  [name], MRN:  [id number], DOB:  1956-02-10, LOS: 8 ADMISSION DATE:  06/14/2023, CONSULTATION DATE:  06/14/2023 REFERRING MD:  Leafy Ro - TCTS, CHIEF COMPLAINT: Postoperative management   History of Present Illness:  68 year old woman with past medical history of asthma, T2DM, HTN, HLD, severe aortic stenosis who presents for aortic valve replacement, possible aortic root replacement.  Patient had recent admission to [facility] 04/27/23-04/30/23 for progressive dyspnea requiring 4LNC. Appeared to be FVO. She was diuresed and eventually weaned off nitroglycerin gtt for concomitant hypertensive emergency. She had echo which showed EF 60-65%, no RWMA, G2DD, moderate AVR, severe AV stenosis. She had follow-up with outpatient cardiology and TCTS.   Intraoperatively, received AVR and root replacement. To ICU from OR on Epi, NE, dex, propofol, insulin. Getting albumin. X1 CT.   Pump time: 2h 78m Xclamp time: 2h 62m EBL: 476cc Cell saver: 230cc 2UPRBC  Pertinent Medical History:  T2DM, HTN, HFpEF, HLD, asthma, aortic stenosis   Significant Hospital Events: Including procedures, antibiotic start and stop dates in addition to other pertinent events   1/21: S/p AVR, aortic root replacement to ICU from OR post-operatively  1/22: Lasix 40mg  IV. NAEON. Extubated yesterday  1/26: Rapid AF symptomatic. Amiodarone drip re-started.   Interim History / Subjective:  Feeling well overall this morning R foot/ankle pain has mostly resolved, significantly better than yesterday Slight bump in WBC, likely 2/2 steroids as she is clinically improving Will put stop date for steroid burst  Objective:  Blood pressure (!) 142/81, pulse (!) 101, temperature 98.2 F (36.8 C), temperature source Oral, resp. rate (!) 37, height 4\' 9"  (1.448 m), weight 89.2 kg, SpO2 94%.        Intake/Output Summary (Last 24 hours) at 06/22/2023 0829 Last data filed at 06/22/2023 0400 Gross per 24 hour  Intake 398.64 ml   Output --  Net 398.64 ml   Filed Weights   06/20/23 0500 06/21/23 0500 06/22/23 0500  Weight: 89.4 kg 89.3 kg 89.2 kg   Physical Examination: General: Overall well-appearing middle-aged woman in NAD. Pleasant and conversant. HEENT: [place]/AT, anicteric sclera, PERRL, moist mucous membranes. Neuro: Awake, oriented x 4. Responds to verbal stimuli. Following commands consistently. Moves all 4 extremities spontaneously. Strength 5/5 in all 4 extremities.  CV: RRR, +faint systolic murmur. PULM: Breathing even and unlabored on RA. Lung fields CTAB. GI: Soft, nontender, nondistended. Normoactive bowel sounds. Extremities: No LE edema noted. R ankle nontender on palpation or with AROM. Skin: Warm/dry, no rashes.  Resolved Hospital Problem List:   Post-operative vent management  Post bypass vasoplegia   Assessment & Plan:  Severe aortic stenosis s/p AVR (bioprosthetic) Severe aortic root calcification s/p aortic root replacement  Expected post-operative ABLA Expected post-operative consumptive thrombocytopenia-resolved Severe AS with moderate AVR now s/p replacement. Intraoperatively found to have significant aortic root calcifications and proceeded with root replacement.  - Postoperative management per TCTS (primary team) - Multimodal pain control (APAP, oxycodone, tramadol; maximize adjunct medications as able) - ASA - Diuresis per TCTS - PT/OT, encourage mobility  New onset atrial fibrillation Complete Heart Block - improved - Cardiac monitoring - Optimize electrolytes for K > 4, Mg > 2 - Eliquis for AC - Amiodarone PO - EP following  Hypertension  - Continue amlodipine, losartan - Holding BB/AV nodal blocking medications  Hyperlipidemia  - Continue ASA (as above), Crestor, Zetia  Fevers; has had discolored sputum raising concern for pneumonia. Positive sputum culture: E. Faecalis. Pan sensitive Leukocytosis, likely multifactorial in the setting  of surgery/stress, steroid  use (?gout flare) - Trend WBC, fever curve - F/u Cx data - Continue Augmentin for now  R ankle pain, resolving History of gout  - F/u LE Doppler - XR fortunately negative for acute findings - Prednisone started 1/28 for ?gout flare, low threshold to discontinue this, though her ability to use NSAIDs is limited in the setting of AKI/CKD3 - Will plan for 3-5 day burst  AKI on CKD 3b, improved - Trend BMP - Replete electrolytes as indicated - Monitor I&Os - Avoid nephrotoxic agents as able - Ensure adequate renal perfusion  Diabetes, minimal insulin requirements At risk for steroid-induced hyperglycemia - SSI - CBGs ACHS - Goal CBG 140-180  Best Practice: (right click and "Reselect all SmartList Selections" daily)   Diet/type: Regular consistency (see orders) DVT prophylaxis: LMWH Pressure ulcer(s): none GI prophylaxis: N/A Lines: N/A Foley:  N/A Code Status:  full code Last date of multidisciplinary goals of care discussion [per primary]  Critical care time: N/A   Faythe Ghee [facility] 06/22/23 8:29 AM  Please see [email or web address] for pager details.  From 7A-7P if no response, please call [phone] After hours, please call ELink [phone]

## 2023-06-23 ENCOUNTER — Other Ambulatory Visit (HOSPITAL_COMMUNITY): Payer: Self-pay

## 2023-06-23 ENCOUNTER — Ambulatory Visit: Payer: Self-pay

## 2023-06-23 LAB — GLUCOSE, CAPILLARY
Glucose-Capillary: 126 mg/dL — ABNORMAL HIGH (ref 70–99)
Glucose-Capillary: 196 mg/dL — ABNORMAL HIGH (ref 70–99)
Glucose-Capillary: 125 mg/dL — ABNORMAL HIGH (ref 70–99)
Glucose-Capillary: 93 mg/dL (ref 70–99)

## 2023-06-23 LAB — CBC
HCT: 29.4 % — ABNORMAL LOW (ref 36.0–46.0)
Hemoglobin: 9.1 g/dL — ABNORMAL LOW (ref 12.0–15.0)
MCH: 24.7 pg — ABNORMAL LOW (ref 26.0–34.0)
MCHC: 31 g/dL (ref 30.0–36.0)
MCV: 79.9 fL — ABNORMAL LOW (ref 80.0–100.0)
Platelets: 338 10*3/uL (ref 150–400)
RBC: 3.68 MIL/uL — ABNORMAL LOW (ref 3.87–5.11)
RDW: 17.9 % — ABNORMAL HIGH (ref 11.5–15.5)
WBC: 18.4 10*3/uL — ABNORMAL HIGH (ref 4.0–10.5)
nRBC: 0.2 % (ref 0.0–0.2)

## 2023-06-23 LAB — BASIC METABOLIC PANEL
Anion gap: 10 (ref 5–15)
BUN: 27 mg/dL — ABNORMAL HIGH (ref 8–23)
CO2: 29 mmol/L (ref 22–32)
Calcium: 9.1 mg/dL (ref 8.9–10.3)
Chloride: 100 mmol/L (ref 98–111)
Creatinine, Ser: 1.09 mg/dL — ABNORMAL HIGH (ref 0.44–1.00)
GFR, Estimated: 56 mL/min — ABNORMAL LOW (ref >60)
Glucose, Bld: 123 mg/dL — ABNORMAL HIGH (ref 70–99)
Potassium: 4.2 mmol/L (ref 3.5–5.1)
Sodium: 139 mmol/L (ref 135–145)

## 2023-06-23 LAB — MAGNESIUM: Magnesium: 2.1 mg/dL (ref 1.7–2.4)

## 2023-06-23 MED ORDER — POLYETHYLENE GLYCOL 3350 17 G PO PACK
17.0000 g | PACK | Freq: Every day | ORAL | Status: DC | PRN
  Administered 2023-06-23: 17 g via ORAL
  Filled 2023-06-23: qty 1

## 2023-06-23 MED ORDER — LOSARTAN POTASSIUM 50 MG PO TABS
50.0000 mg | ORAL_TABLET | Freq: Every day | ORAL | Status: DC
  Administered 2023-06-23 – 2023-06-24 (×2): 50 mg via ORAL
  Filled 2023-06-23 (×2): qty 1

## 2023-06-23 MED ORDER — APIXABAN 5 MG PO TABS
5.0000 mg | ORAL_TABLET | Freq: Two times a day (BID) | ORAL | 1 refills | Status: DC
  Filled 2023-06-23 – 2023-08-01 (×3): qty 60, 30d supply, fill #0

## 2023-06-23 NOTE — Plan of Care (Signed)
  Problem: Education: Goal: Knowledge of General Education information will improve Description: Including pain rating scale, medication(s)/side effects and non-pharmacologic comfort measures Outcome: Progressing   Problem: Health Behavior/Discharge Planning: Goal: Ability to manage health-related needs will improve Outcome: Progressing   Problem: Clinical Measurements: Goal: Ability to maintain clinical measurements within normal limits will improve Outcome: Progressing Goal: Will remain free from infection Outcome: Progressing Goal: Diagnostic test results will improve Outcome: Progressing Goal: Respiratory complications will improve Outcome: Progressing Goal: Cardiovascular complication will be avoided Outcome: Progressing   Problem: Activity: Goal: Risk for activity intolerance will decrease Outcome: Progressing   Problem: Nutrition: Goal: Adequate nutrition will be maintained Outcome: Progressing   Problem: Coping: Goal: Level of anxiety will decrease Outcome: Progressing   Problem: Elimination: Goal: Will not experience complications related to bowel motility Outcome: Progressing Goal: Will not experience complications related to urinary retention Outcome: Progressing   Problem: Pain Managment: Goal: General experience of comfort will improve and/or be controlled Outcome: Progressing   Problem: Safety: Goal: Ability to remain free from injury will improve Outcome: Progressing   Problem: Skin Integrity: Goal: Risk for impaired skin integrity will decrease Outcome: Progressing   Problem: Education: Goal: Will demonstrate proper wound care and an understanding of methods to prevent future damage Outcome: Progressing Goal: Knowledge of disease or condition will improve Outcome: Progressing Goal: Knowledge of the prescribed therapeutic regimen will improve Outcome: Progressing Goal: Individualized Educational Video(s) Outcome: Progressing   Problem:  Activity: Goal: Risk for activity intolerance will decrease Outcome: Progressing   Problem: Cardiac: Goal: Will achieve and/or maintain hemodynamic stability Outcome: Progressing   Problem: Clinical Measurements: Goal: Postoperative complications will be avoided or minimized Outcome: Progressing   Problem: Respiratory: Goal: Respiratory status will improve Outcome: Progressing   Problem: Skin Integrity: Goal: Wound healing without signs and symptoms of infection Outcome: Progressing Goal: Risk for impaired skin integrity will decrease Outcome: Progressing   Problem: Urinary Elimination: Goal: Ability to achieve and maintain adequate renal perfusion and functioning will improve Outcome: Progressing   Problem: Education: Goal: Ability to describe self-care measures that may prevent or decrease complications (Diabetes Survival Skills Education) will improve Outcome: Progressing Goal: Individualized Educational Video(s) Outcome: Progressing   Problem: Coping: Goal: Ability to adjust to condition or change in health will improve Outcome: Progressing   Problem: Fluid Volume: Goal: Ability to maintain a balanced intake and output will improve Outcome: Progressing   Problem: Health Behavior/Discharge Planning: Goal: Ability to identify and utilize available resources and services will improve Outcome: Progressing Goal: Ability to manage health-related needs will improve Outcome: Progressing   Problem: Metabolic: Goal: Ability to maintain appropriate glucose levels will improve Outcome: Progressing   Problem: Nutritional: Goal: Maintenance of adequate nutrition will improve Outcome: Progressing Goal: Progress toward achieving an optimal weight will improve Outcome: Progressing   Problem: Skin Integrity: Goal: Risk for impaired skin integrity will decrease Outcome: Progressing   Problem: Tissue Perfusion: Goal: Adequacy of tissue perfusion will improve Outcome:  Progressing

## 2023-06-23 NOTE — Progress Notes (Signed)
9 Days Post-Op Procedure(s) (LRB): ASCENDING AORTIC ROOT REPLACEMENT USING KONECT RESILIA AORTIC VALVE CONDUIT SIZE AND REATTACHMENT OF RIGHT AND LEFT CORONARY (N/A) TRANSESOPHAGEAL ECHOCARDIOGRAM (TEE) (N/A) Subjective: Feels pretty well  Objective: Vital signs in last 24 hours: Temp:  [97.6 F (36.4 C)-98.6 F (37 C)] 98.6 F (37 C) (01/30 0245) Pulse Rate:  [74-99] 74 (01/30 0245) Cardiac Rhythm: Normal sinus rhythm;Bundle branch block (01/29 2124) Resp:  [7-31] 18 (01/30 0245) BP: (107-148)/(43-87) 130/80 (01/30 0245) SpO2:  [77 %-100 %] 96 % (01/30 0245) Weight:  [88.7 kg] 88.7 kg (01/30 0539)  Hemodynamic parameters for last 24 hours:    Intake/Output from previous day: 01/29 0701 - 01/30 0700 In: 120 [P.O.:120] Out: -  Intake/Output this shift: No intake/output data recorded.  General appearance: alert, cooperative, and no distress Heart: regular rate and rhythm Lungs: clear to auscultation bilaterally Abdomen: benign Extremities: no edema Wound: incis healing well  Lab Results: Recent Labs    06/22/23 0210 06/23/23 0257  WBC 15.8* 18.4*  HGB 9.7* 9.1*  HCT 30.9* 29.4*  PLT 300 338   BMET:  Recent Labs    06/22/23 0210 06/23/23 0257  NA 137 139  K 4.3 4.2  CL 99 100  CO2 27 29  GLUCOSE 166* 123*  BUN 17 27*  CREATININE 1.10* 1.09*  CALCIUM 9.0 9.1    PT/INR: No results for input(s): "LABPROT", "INR" in the last 72 hours. ABG    Component Value Date/Time   PHART 7.313 (L) 06/14/2023 1958   HCO3 26.5 06/14/2023 1958   TCO2 28 06/14/2023 1958   ACIDBASEDEF 3.0 (H) 06/14/2023 1813   O2SAT 98 06/14/2023 1958   CBG (last 3)  Recent Labs    06/22/23 1618 06/22/23 2141 06/23/23 0545  GLUCAP 132* 125* 126*    Meds Scheduled Meds:  amiodarone  400 mg Oral BID   amLODipine  10 mg Oral QHS   amoxicillin-clavulanate  1 tablet Oral Q12H   apixaban  5 mg Oral BID   aspirin  81 mg Oral Daily   bisacodyl  10 mg Oral Daily   Or    bisacodyl  10 mg Rectal Daily   Chlorhexidine Gluconate Cloth  6 each Topical Daily   docusate sodium  200 mg Oral Daily   ezetimibe  10 mg Oral Daily   feeding supplement (GLUCERNA SHAKE)  237 mL Oral BID BM   furosemide  40 mg Oral Daily   insulin aspart  0-15 Units Subcutaneous TID WC   losartan  25 mg Oral Daily   metoprolol tartrate  12.5 mg Oral BID   [START ON 06/24/2023] pneumococcal 20-valent conjugate vaccine  0.5 mL Intramuscular Tomorrow-1000   predniSONE  40 mg Oral Q breakfast   rosuvastatin  10 mg Oral QODAY   sodium chloride flush  10-40 mL Intracatheter Q12H   sodium chloride flush  3 mL Intravenous Q12H   Continuous Infusions: PRN Meds:.acetaminophen, dextrose, loratadine, ondansetron (ZOFRAN) IV, mouth rinse, oxyCODONE, sodium chloride flush, sodium chloride flush, traMADol  Xrays VAS [place] LOWER EXTREMITY VENOUS (DVT) Result Date: 06/22/2023  Lower Venous DVT Study Patient Name:  [name]  Date of Exam:   06/22/2023 Medical Rec #: [id number]          Accession #:    [id number] Date of Birth: Feb 13, 1956          Patient Gender: F Patient Age:   68 years Exam Location:  [facility] Procedure:  VAS [place] LOWER EXTREMITY VENOUS (DVT) Referring Phys: Renae Fickle HOFFMAN --------------------------------------------------------------------------------  Indications: Pain.  Risk Factors: Surgery Ascending aortic root replacement 06/14/23 obesity and past pregnancy. Comparison Study: No significant changes seens ince previous exam 08/24/16. Performing Technologist: Shona Simpson  Examination Guidelines: A complete evaluation includes B-mode imaging, spectral Doppler, color Doppler, and power Doppler as needed of all accessible portions of each vessel. Bilateral testing is considered an integral part of a complete examination. Limited examinations for reoccurring indications may be performed as noted. The reflux portion of the exam is performed with the patient in reverse Trendelenburg.   +---------+---------------+---------+-----------+----------+--------------+ RIGHT    CompressibilityPhasicitySpontaneityPropertiesThrombus Aging +---------+---------------+---------+-----------+----------+--------------+ CFV      Full           Yes      Yes                                 +---------+---------------+---------+-----------+----------+--------------+ SFJ      Full                                                        +---------+---------------+---------+-----------+----------+--------------+ FV Prox  Full                                                        +---------+---------------+---------+-----------+----------+--------------+ FV Mid   Full                                                        +---------+---------------+---------+-----------+----------+--------------+ FV DistalFull                                                        +---------+---------------+---------+-----------+----------+--------------+ PFV      Full                                                        +---------+---------------+---------+-----------+----------+--------------+ POP      Full           Yes      Yes                                 +---------+---------------+---------+-----------+----------+--------------+ PTV      Full                                                        +---------+---------------+---------+-----------+----------+--------------+ PERO     Full                                                        +---------+---------------+---------+-----------+----------+--------------+   +----+---------------+---------+-----------+----------+--------------+  LEFTCompressibilityPhasicitySpontaneityPropertiesThrombus Aging +----+---------------+---------+-----------+----------+--------------+ CFV Full           Yes      Yes                                  +----+---------------+---------+-----------+----------+--------------+     Summary: RIGHT: - There is no evidence of deep vein thrombosis in the lower extremity.  - No cystic structure found in the popliteal fossa.  LEFT: - No evidence of common femoral vein obstruction.   *See table(s) above for measurements and observations. Electronically signed by Heath Lark on 06/22/2023 at 4:14:41 PM.    Final    DG Ankle 2 Views Right Result Date: 06/21/2023 CLINICAL DATA:  Right ankle pain. EXAM: RIGHT ANKLE - 2 VIEW COMPARISON:  None Available. FINDINGS: There is no evidence of fracture, dislocation, or joint effusion. The ankle mortise is preserved. Minor tibial talar spurring. Small plantar calcaneal spur. Small Achilles tendon enthesophyte. No erosions or focal bone abnormality. No focal soft tissue abnormalities. IMPRESSION: 1. No acute findings. 2. Minimal tibial talar spurring. Small plantar calcaneal spur and Achilles tendon enthesophyte. Electronically Signed   By: Narda Rutherford M.D.   On: 06/21/2023 16:52   Recent Results (from the past 240 hours)  Culture, blood (Routine X 2) w Reflex to ID Panel     Status: None   Collection Time: 06/17/23 12:45 PM   Specimen: BLOOD  Result Value Ref Range Status   Specimen Description BLOOD SITE NOT SPECIFIED  Final   Special Requests   Final    BOTTLES DRAWN AEROBIC AND ANAEROBIC Blood Culture results may not be optimal due to an inadequate volume of blood received in culture bottles   Culture   Final    NO GROWTH 5 DAYS Performed at [facility], [address], [place], [place] [address]    Report Status 06/22/2023 FINAL  Final  Culture, blood (Routine X 2) w Reflex to ID Panel     Status: None   Collection Time: 06/17/23 12:47 PM   Specimen: BLOOD  Result Value Ref Range Status   Specimen Description BLOOD SITE NOT SPECIFIED  Final   Special Requests   Final    BOTTLES DRAWN AEROBIC AND ANAEROBIC Blood Culture results may not be optimal due  to an inadequate volume of blood received in culture bottles   Culture   Final    NO GROWTH 5 DAYS Performed at [facility], [address] [place], [place] [address]    Report Status 06/22/2023 FINAL  Final  Expectorated Sputum Assessment w Gram Stain, Rflx to Resp Cult     Status: None   Collection Time: 06/17/23  1:20 PM   Specimen: Sputum  Result Value Ref Range Status   Specimen Description SPU  Final   Special Requests NONE  Final   Sputum evaluation   Final    THIS SPECIMEN IS ACCEPTABLE FOR SPUTUM CULTURE Performed at [facility], [address], [place], [place] [address]    Report Status 06/18/2023 FINAL  Final  Culture, Respiratory w Gram Stain     Status: None   Collection Time: 06/17/23  1:20 PM   Specimen: Sputum  Result Value Ref Range Status   Specimen Description SPU  Final   Special Requests NONE Reflexed from [id number]  Final   Gram Stain   Final    RARE WBC PRESENT, PREDOMINANTLY PMN  FEW GRAM POSITIVE COCCI FEW GRAM NEGATIVE RODS RARE BUDDING YEAST SEEN Performed at [facility], [address], [place], [place] [address]    Culture   Final    ABUNDANT ENTEROCOCCUS FAECALIS RARE ENTEROBACTER CLOACAE    Report Status 06/22/2023 FINAL  Final   Organism ID, Bacteria ENTEROCOCCUS FAECALIS  Final   Organism ID, Bacteria ENTEROBACTER CLOACAE  Final      Susceptibility   Enterobacter cloacae - MIC*    CEFEPIME <=0.12 SENSITIVE Sensitive     CEFTAZIDIME <=1 SENSITIVE Sensitive     CIPROFLOXACIN <=0.25 SENSITIVE Sensitive     GENTAMICIN <=1 SENSITIVE Sensitive     IMIPENEM 0.5 SENSITIVE Sensitive     TRIMETH/SULFA >=320 RESISTANT Resistant     PIP/TAZO 8 SENSITIVE Sensitive ug/mL    * RARE ENTEROBACTER CLOACAE   Enterococcus faecalis - MIC*    AMPICILLIN <=2 SENSITIVE Sensitive     VANCOMYCIN 1 SENSITIVE Sensitive     GENTAMICIN SYNERGY SENSITIVE Sensitive     * ABUNDANT ENTEROCOCCUS FAECALIS     Assessment/Plan: S/P Procedure(s)  (LRB): ASCENDING AORTIC ROOT REPLACEMENT USING KONECT RESILIA AORTIC VALVE CONDUIT SIZE AND REATTACHMENT OF RIGHT AND LEFT CORONARY (N/A) TRANSESOPHAGEAL ECHOCARDIOGRAM (TEE) (N/A) POD#9  1 afeb, S BP 100's-140's, mostly on higher end, BBB, on eliquis, low dose Beta blocker, will increase losartan to 50, cont norvasc 10 2 O2 sats good on RA 3 weight stable 4 venous scan- no DVT 5 BS good control 6 creat slightly elev 1.09, MG++ and potassium normal 7 leukocytosis trend increased, WBC 18.4- currently on augmentin for enterococcus in sputum, most recent CXR did not look like pneumonia- she has been on bactrim for 2 years for back surgery chronic prophylaxis from previous hardware issue- cont augmentin and follow clinically for now, will check CBC w/Diff in am 8 H/H holding stable 9 cont rehab and pulm hygiene , close to ready for d/c from that perspective w/ HH    LOS: 9 days    Rowe Clack PA-C Pager [phone] 06/23/2023

## 2023-06-23 NOTE — Progress Notes (Signed)
CARDIAC REHAB PHASE I   Went to offer walk to pt. Pt denied walk stating she just ambulated with PT or OT, unsure which, and did stair training. Now very fatigued and wishing to rest. Will return as time allows.   Jonna Coup, MS, ACSM-CEP 06/23/2023 1:17 PM

## 2023-06-23 NOTE — Progress Notes (Signed)
Physical Therapy Treatment Patient Details Name: [name] MRN: [id number] DOB: 1955-09-27 Today's Date: 06/23/2023   History of Present Illness Pt is a 68yo female who presented for aortic valve replacement completed on 1/21. PMH includes: recent removal of hardware at L3, L4, and S1 with PLIF of L3-4 on 8/23, anxiety, aortic stenosis, obesity, DM II, HTN, and osteoarthritis of L hip.    PT Comments  Pt pleasant and agreeable to PT session. Introduced Occupational hygienist, pt increased gait distance and displayed a WFL gait pattern with supervision. Pt demonstrated good safety awareness and endurance, acknowledging when she needed a seated rest break to recovery between activities. Performed stair training, pt required CGA and intermittent cueing to maintain sternal precautions. She utilized a step-to pattern and ascended forward and descended sideways. Provided pt with general LE exercise handout to maintain ROM/strength and discussed how to complete exercises in both supine and seated positions. Will continue to follow acutely and advance appropriately.    If plan is discharge home, recommend the following: A little help with walking and/or transfers;A little help with bathing/dressing/bathroom;Assist for transportation;Help with stairs or ramp for entrance   Can travel by private vehicle        Equipment Recommendations  Rollator (4 wheels)    Recommendations for Other Services       Precautions / Restrictions Precautions Precautions: Sternal Precaution Booklet Issued: No Precaution Comments: Pt state sternal precautions at start of session. Intermittent cueing provided to maintain compliance while completing stairs     Mobility  Bed Mobility               General bed mobility comments: Not assessed. Pt greeted seated EOB eating her lunch.    Transfers Overall transfer level: Needs assistance Equipment used: None Transfers: Sit to/from Stand Sit to Stand: Supervision            General transfer comment: Pt scooted fwd, increased fwd lean, and powered up with legs. Good eccentric control with sitting.    Ambulation/Gait Ambulation/Gait assistance: Supervision Gait Distance (Feet): 300 Feet (2x300 from EOB to outside stairwell. Seated break before and after stairs. 1x8 in/out of stairwell.) Assistive device: Rollator (4 wheels) Gait Pattern/deviations: WFL(Within Functional Limits)   Gait velocity interpretation: 1.31 - 2.62 ft/sec, indicative of limited community ambulator       Stairs Stairs: Yes Stairs assistance: Contact guard assist Stair Management: One rail Left, Step to pattern, Forwards, Backwards Number of Stairs: 10 General stair comments: Ascending, pt alternated which foot led but would bring both feet to one step before continuing and had LUE slightly resting on handrail. Intermittent cueing to increase awareness of sternal precautions. Descending, pt faced handrail with BUE support and elbows tucked by her side, side stepping down and bringing both feet to one step prior to moving on.   Wheelchair Mobility     Tilt Bed    Modified Rankin (Stroke Patients Only)       Balance Overall balance assessment: Mild deficits observed, not formally tested                                          Cognition Arousal: Alert Behavior During Therapy: WFL for tasks assessed/performed Overall Cognitive Status: Within Functional Limits for tasks assessed  General Comments: Pt displayed good safety awareness and indicated when she needed to take a rest break.        Exercises General Exercises - Lower Extremity Ankle Circles/Pumps: Both, 20 reps Long Arc Quad: Both, 10 reps Hip Flexion/Marching: Both, 10 reps Toe Raises: Both, 10 reps Heel Raises: Both, 10 reps    General Comments General comments (skin integrity, edema, etc.): VSS on RA      Pertinent Vitals/Pain  Pain Assessment Pain Assessment: No/denies pain    Home Living                          Prior Function            PT Goals (current goals can now be found in the care plan section) Acute Rehab PT Goals Patient Stated Goal: Go Home Progress towards PT goals: Progressing toward goals    Frequency    Min 1X/week      PT Plan      Co-evaluation              AM-PAC PT "6 Clicks" Mobility   Outcome Measure  Help needed turning from your back to your side while in a flat bed without using bedrails?: A Little Help needed moving from lying on your back to sitting on the side of a flat bed without using bedrails?: A Little Help needed moving to and from a bed to a chair (including a wheelchair)?: A Little Help needed standing up from a chair using your arms (e.g., wheelchair or bedside chair)?: A Little Help needed to walk in hospital room?: A Little Help needed climbing 3-5 steps with a railing? : A Little 6 Click Score: 18    End of Session Equipment Utilized During Treatment: Gait belt Activity Tolerance: Patient tolerated treatment well Patient left: with call bell/phone within reach;with family/visitor present (on side of bed) Nurse Communication: Mobility status PT Visit Diagnosis: Unsteadiness on feet (R26.81);Difficulty in walking, not elsewhere classified (R26.2)     Time: 1610-9604 PT Time Calculation (min) (ACUTE ONLY): 27 min  Charges:    $Gait Training: 23-37 mins                       Cheri Guppy, PT, DPT Acute Rehabilitation Services Office: [phone] Secure Chat Preferred   Richardson Chiquito 06/23/2023, 2:31 PM

## 2023-06-24 ENCOUNTER — Other Ambulatory Visit (HOSPITAL_COMMUNITY): Payer: Self-pay

## 2023-06-24 LAB — CBC WITH DIFFERENTIAL/PLATELET
Abs Immature Granulocytes: 0 10*3/uL (ref 0.00–0.07)
Basophils Absolute: 0.2 10*3/uL — ABNORMAL HIGH (ref 0.0–0.1)
Basophils Relative: 1 %
Eosinophils Absolute: 0 10*3/uL (ref 0.0–0.5)
Eosinophils Relative: 0 %
HCT: 30.4 % — ABNORMAL LOW (ref 36.0–46.0)
Hemoglobin: 9.4 g/dL — ABNORMAL LOW (ref 12.0–15.0)
Lymphocytes Relative: 20 %
Lymphs Abs: 3.7 10*3/uL (ref 0.7–4.0)
MCH: 24.6 pg — ABNORMAL LOW (ref 26.0–34.0)
MCHC: 30.9 g/dL (ref 30.0–36.0)
MCV: 79.6 fL — ABNORMAL LOW (ref 80.0–100.0)
Monocytes Absolute: 0.7 10*3/uL (ref 0.1–1.0)
Monocytes Relative: 4 %
Neutro Abs: 14 10*3/uL — ABNORMAL HIGH (ref 1.7–7.7)
Neutrophils Relative %: 75 %
Platelets: 414 10*3/uL — ABNORMAL HIGH (ref 150–400)
RBC: 3.82 MIL/uL — ABNORMAL LOW (ref 3.87–5.11)
RDW: 18 % — ABNORMAL HIGH (ref 11.5–15.5)
WBC: 18.7 10*3/uL — ABNORMAL HIGH (ref 4.0–10.5)
nRBC: 0.3 % — ABNORMAL HIGH (ref 0.0–0.2)
nRBC: 1 /100{WBCs} — ABNORMAL HIGH

## 2023-06-24 LAB — BASIC METABOLIC PANEL
Anion gap: 10 (ref 5–15)
BUN: 27 mg/dL — ABNORMAL HIGH (ref 8–23)
CO2: 28 mmol/L (ref 22–32)
Calcium: 9.1 mg/dL (ref 8.9–10.3)
Chloride: 101 mmol/L (ref 98–111)
Creatinine, Ser: 1.06 mg/dL — ABNORMAL HIGH (ref 0.44–1.00)
GFR, Estimated: 58 mL/min — ABNORMAL LOW (ref >60)
Glucose, Bld: 121 mg/dL — ABNORMAL HIGH (ref 70–99)
Potassium: 4 mmol/L (ref 3.5–5.1)
Sodium: 139 mmol/L (ref 135–145)

## 2023-06-24 LAB — GLUCOSE, CAPILLARY: Glucose-Capillary: 93 mg/dL (ref 70–99)

## 2023-06-24 LAB — MAGNESIUM: Magnesium: 2 mg/dL (ref 1.7–2.4)

## 2023-06-24 MED ORDER — AMIODARONE HCL 200 MG PO TABS
200.0000 mg | ORAL_TABLET | Freq: Two times a day (BID) | ORAL | 1 refills | Status: DC
  Filled 2023-06-24: qty 60, 30d supply, fill #0

## 2023-06-24 MED ORDER — ASPIRIN 81 MG PO CHEW: 81.0000 mg | CHEWABLE_TABLET | Freq: Every day | ORAL | Status: DC

## 2023-06-24 MED ORDER — METOPROLOL TARTRATE 25 MG PO TABS
12.5000 mg | ORAL_TABLET | Freq: Two times a day (BID) | ORAL | 1 refills | Status: DC
  Filled 2023-06-24: qty 30, 30d supply, fill #0

## 2023-06-24 MED ORDER — LOSARTAN POTASSIUM 100 MG PO TABS
100.0000 mg | ORAL_TABLET | Freq: Every day | ORAL | 1 refills | Status: AC
  Filled 2023-06-24 – 2023-07-29 (×2): qty 30, 30d supply, fill #0

## 2023-06-24 MED ORDER — AMOXICILLIN-POT CLAVULANATE 875-125 MG PO TABS
1.0000 | ORAL_TABLET | Freq: Two times a day (BID) | ORAL | 0 refills | Status: DC
  Filled 2023-06-24: qty 10, 5d supply, fill #0

## 2023-06-24 MED ORDER — OXYCODONE HCL 5 MG PO TABS
5.0000 mg | ORAL_TABLET | Freq: Four times a day (QID) | ORAL | 0 refills | Status: AC | PRN
  Filled 2023-06-24: qty 28, 7d supply, fill #0

## 2023-06-24 MED ORDER — EZETIMIBE 10 MG PO TABS
10.0000 mg | ORAL_TABLET | Freq: Every day | ORAL | 1 refills | Status: DC
  Filled 2023-06-24 – 2023-07-29 (×2): qty 30, 30d supply, fill #0

## 2023-06-24 NOTE — Progress Notes (Signed)
Discussed with pt IS (currently 750 ml), sternal precautions, exercise, diet, and CRPII. Pt receptive. Will refer to [place] CRPII. She is moving well, observed her getting out of bed and walking to BR. Gave [facility] d/c video to view later.  [id number] Ethelda Chick BS, ACSM-CEP 06/24/2023 9:16 AM

## 2023-06-24 NOTE — TOC Transition Note (Signed)
Transition of Care [facility]) - Discharge Note Donn Pierini RN, BSN Transitions of Care Unit 4E- RN Case Manager See Treatment Team for direct phone #   Patient Details  Name: [name] MRN: [id number] Date of Birth: 07/20/1955  Transition of Care [facility]) CM/SW Contact:  Darrold Span, RN Phone Number: 06/24/2023, 10:10 AM   Clinical Narrative:    Pt stable for transition home today, HHRN/PT has been set up with [facility] per previous CM note, liaison notified for start of care.   DME- Rollator confirmed delivered to home on 1/28 per [employer].   Family to transport home. No further TOC needs noted.    Final next level of care: Home w Home Health Services Barriers to Discharge: Barriers Resolved   Patient Goals and CMS Choice Patient states their goals for this hospitalization and ongoing recovery are:: wants to recover CMS Medicare.gov Compare Post Acute Care list provided to:: Patient Choice offered to / list presented to : Patient      Discharge Placement               Home w/ [facility]        Discharge Plan and Services Additional resources added to the After Visit Summary for     Discharge Planning Services: CM Consult Post Acute Care Choice: Home Health          DME Arranged: Walker rolling with seat DME Agency: [employer] Date DME Agency Contacted: 06/21/23 Time DME Agency Contacted: 1014 Representative spoke with at DME Agency: Mitch HH Arranged: RN, PT HH Agency: [facility] Date [facility] Agency Contacted: 06/21/23 Time HH Agency Contacted: 1015 Representative spoke with at [facility] Agency: Hassel Neth  Social Drivers of Health (SDOH) Interventions SDOH Screenings   Food Insecurity: No Food Insecurity (06/18/2023)  Housing: Low Risk  (06/21/2023)  Transportation Needs: No Transportation Needs (06/18/2023)  Utilities: Not At Risk (06/18/2023)  Depression (PHQ2-9): Low Risk  (04/26/2023)  Financial Resource Strain: Low Risk  (04/13/2023)   Received  from [facility]  Physical Activity: Inactive (04/13/2023)   Received from [facility]  Social Connections: Unknown (06/18/2023)  Recent Concern: Social Connections - Somewhat Isolated (04/13/2023)   Received from [facility]  Stress: No Stress Concern Present (04/13/2023)   Received from [facility]  Tobacco Use: Low Risk  (06/22/2023)     Readmission Risk Interventions    06/24/2023   10:10 AM  Readmission Risk Prevention Plan  Transportation Screening Complete  Home Care Screening Complete  Medication Review (RN CM) Complete

## 2023-06-24 NOTE — Progress Notes (Addendum)
Chest tube sutures removed, steri strips applied. Sites CDI. Tolerated well.   Discharge instructions reviewed with pt. Pt had no questions at this time.  Copy of instructions given to pt. [facility] has filled scripts and will be picked up on the way out for discharge. Pt's husband is on his way and will be here shortly.  Pt states her rollator was delivered two days ago and she has sent it home with family.  Pt declines pneumonia vaccine at this time, she states one of the doctors wanted her to wait a few weeks after surgery to get the vaccine.    Pt will be d/c'd via wheelchair with belongings, and will be escorted by staff.  Kairah Leoni,RN SWOT

## 2023-06-24 NOTE — Progress Notes (Signed)
10 Days Post-Op Procedure(s) (LRB): ASCENDING AORTIC ROOT REPLACEMENT USING KONECT RESILIA AORTIC VALVE CONDUIT SIZE AND REATTACHMENT OF RIGHT AND LEFT CORONARY (N/A) TRANSESOPHAGEAL ECHOCARDIOGRAM (TEE) (N/A) Subjective: Generally feels well  Objective: Vital signs in last 24 hours: Temp:  [97.6 F (36.4 C)-98.3 F (36.8 C)] 97.6 F (36.4 C) (01/31 0355) Pulse Rate:  [78-98] 78 (01/31 0355) Cardiac Rhythm: Normal sinus rhythm (01/31 0402) Resp:  [16-18] 18 (01/31 0355) BP: (113-151)/(58-73) 131/65 (01/31 0355) SpO2:  [96 %-98 %] 97 % (01/31 0355) Weight:  [87.9 kg] 87.9 kg (01/31 0655)  Hemodynamic parameters for last 24 hours:    Intake/Output from previous day: 01/30 0701 - 01/31 0700 In: 480 [P.O.:480] Out: -  Intake/Output this shift: No intake/output data recorded.  General appearance: alert, cooperative, and no distress Heart: regular rate and rhythm Lungs: clear to auscultation bilaterally Abdomen: benign Ext : no edema Incis healing well   Lab Results: Recent Labs    06/23/23 0257 06/24/23 0316  WBC 18.4* 18.7*  HGB 9.1* 9.4*  HCT 29.4* 30.4*  PLT 338 414*   BMET:  Recent Labs    06/23/23 0257 06/24/23 0316  NA 139 139  K 4.2 4.0  CL 100 101  CO2 29 28  GLUCOSE 123* 121*  BUN 27* 27*  CREATININE 1.09* 1.06*  CALCIUM 9.1 9.1    PT/INR: No results for input(s): "LABPROT", "INR" in the last 72 hours. ABG    Component Value Date/Time   PHART 7.313 (L) 06/14/2023 1958   HCO3 26.5 06/14/2023 1958   TCO2 28 06/14/2023 1958   ACIDBASEDEF 3.0 (H) 06/14/2023 1813   O2SAT 98 06/14/2023 1958   CBG (last 3)  Recent Labs    06/23/23 1619 06/23/23 2326 06/24/23 0708  GLUCAP 125* 93 93    Meds Scheduled Meds:  amiodarone  400 mg Oral BID   amLODipine  10 mg Oral QHS   amoxicillin-clavulanate  1 tablet Oral Q12H   apixaban  5 mg Oral BID   aspirin  81 mg Oral Daily   bisacodyl  10 mg Oral Daily   Or   bisacodyl  10 mg Rectal Daily    Chlorhexidine Gluconate Cloth  6 each Topical Daily   docusate sodium  200 mg Oral Daily   ezetimibe  10 mg Oral Daily   feeding supplement (GLUCERNA SHAKE)  237 mL Oral BID BM   furosemide  40 mg Oral Daily   insulin aspart  0-15 Units Subcutaneous TID WC   losartan  50 mg Oral Daily   metoprolol tartrate  12.5 mg Oral BID   pneumococcal 20-valent conjugate vaccine  0.5 mL Intramuscular Tomorrow-1000   rosuvastatin  10 mg Oral QODAY   sodium chloride flush  10-40 mL Intracatheter Q12H   sodium chloride flush  3 mL Intravenous Q12H   Continuous Infusions: PRN Meds:.acetaminophen, dextrose, loratadine, ondansetron (ZOFRAN) IV, mouth rinse, oxyCODONE, polyethylene glycol, sodium chloride flush, sodium chloride flush, traMADol  Xrays VAS [place] LOWER EXTREMITY VENOUS (DVT) Result Date: 06/22/2023  Lower Venous DVT Study Patient Name:  [name]  Date of Exam:   06/22/2023 Medical Rec #: [id number]          Accession #:    [id number] Date of Birth: Mar 29, 1956          Patient Gender: F Patient Age:   3 years Exam Location:  [facility] Procedure:      VAS [place] LOWER EXTREMITY VENOUS (DVT) Referring Phys: Renae Fickle HOFFMAN --------------------------------------------------------------------------------  Indications: Pain.  Risk Factors: Surgery Ascending aortic root replacement 06/14/23 obesity and past pregnancy. Comparison Study: No significant changes seens ince previous exam 08/24/16. Performing Technologist: Shona Simpson  Examination Guidelines: A complete evaluation includes B-mode imaging, spectral Doppler, color Doppler, and power Doppler as needed of all accessible portions of each vessel. Bilateral testing is considered an integral part of a complete examination. Limited examinations for reoccurring indications may be performed as noted. The reflux portion of the exam is performed with the patient in reverse Trendelenburg.   +---------+---------------+---------+-----------+----------+--------------+ RIGHT    CompressibilityPhasicitySpontaneityPropertiesThrombus Aging +---------+---------------+---------+-----------+----------+--------------+ CFV      Full           Yes      Yes                                 +---------+---------------+---------+-----------+----------+--------------+ SFJ      Full                                                        +---------+---------------+---------+-----------+----------+--------------+ FV Prox  Full                                                        +---------+---------------+---------+-----------+----------+--------------+ FV Mid   Full                                                        +---------+---------------+---------+-----------+----------+--------------+ FV DistalFull                                                        +---------+---------------+---------+-----------+----------+--------------+ PFV      Full                                                        +---------+---------------+---------+-----------+----------+--------------+ POP      Full           Yes      Yes                                 +---------+---------------+---------+-----------+----------+--------------+ PTV      Full                                                        +---------+---------------+---------+-----------+----------+--------------+ PERO     Full                                                        +---------+---------------+---------+-----------+----------+--------------+   +----+---------------+---------+-----------+----------+--------------+  LEFTCompressibilityPhasicitySpontaneityPropertiesThrombus Aging +----+---------------+---------+-----------+----------+--------------+ CFV Full           Yes      Yes                                  +----+---------------+---------+-----------+----------+--------------+     Summary: RIGHT: - There is no evidence of deep vein thrombosis in the lower extremity.  - No cystic structure found in the popliteal fossa.  LEFT: - No evidence of common femoral vein obstruction.   *See table(s) above for measurements and observations. Electronically signed by Heath Lark on 06/22/2023 at 4:14:41 PM.    Final    Results for orders placed or performed during the hospital encounter of 06/14/23  Culture, blood (Routine X 2) w Reflex to ID Panel     Status: None   Collection Time: 06/17/23 12:45 PM   Specimen: BLOOD  Result Value Ref Range Status   Specimen Description BLOOD SITE NOT SPECIFIED  Final   Special Requests   Final    BOTTLES DRAWN AEROBIC AND ANAEROBIC Blood Culture results may not be optimal due to an inadequate volume of blood received in culture bottles   Culture   Final    NO GROWTH 5 DAYS Performed at [facility], [address], [place], [place] [address]    Report Status 06/22/2023 FINAL  Final  Culture, blood (Routine X 2) w Reflex to ID Panel     Status: None   Collection Time: 06/17/23 12:47 PM   Specimen: BLOOD  Result Value Ref Range Status   Specimen Description BLOOD SITE NOT SPECIFIED  Final   Special Requests   Final    BOTTLES DRAWN AEROBIC AND ANAEROBIC Blood Culture results may not be optimal due to an inadequate volume of blood received in culture bottles   Culture   Final    NO GROWTH 5 DAYS Performed at [facility], [address], [place], [place] [address]    Report Status 06/22/2023 FINAL  Final  Expectorated Sputum Assessment w Gram Stain, Rflx to Resp Cult     Status: None   Collection Time: 06/17/23  1:20 PM   Specimen: Sputum  Result Value Ref Range Status   Specimen Description SPU  Final   Special Requests NONE  Final   Sputum evaluation   Final    THIS SPECIMEN IS ACCEPTABLE FOR SPUTUM CULTURE Performed at [facility], [address], [place], [place] [address]    Report Status 06/18/2023 FINAL  Final  Culture, Respiratory w Gram Stain     Status: None   Collection Time: 06/17/23  1:20 PM   Specimen: Sputum  Result Value Ref Range Status   Specimen Description SPU  Final   Special Requests NONE Reflexed from [id number]  Final   Gram Stain   Final    RARE WBC PRESENT, PREDOMINANTLY PMN FEW GRAM POSITIVE COCCI FEW GRAM NEGATIVE RODS RARE BUDDING YEAST SEEN Performed at [facility], [address], [place], [place] [address]    Culture   Final    ABUNDANT ENTEROCOCCUS FAECALIS RARE ENTEROBACTER CLOACAE    Report Status 06/22/2023 FINAL  Final   Organism ID, Bacteria ENTEROCOCCUS FAECALIS  Final   Organism ID, Bacteria ENTEROBACTER CLOACAE  Final      Susceptibility   Enterobacter cloacae - MIC*    CEFEPIME <=0.12 SENSITIVE Sensitive     CEFTAZIDIME <=  1 SENSITIVE Sensitive     CIPROFLOXACIN <=0.25 SENSITIVE Sensitive     GENTAMICIN <=1 SENSITIVE Sensitive     IMIPENEM 0.5 SENSITIVE Sensitive     TRIMETH/SULFA >=320 RESISTANT Resistant     PIP/TAZO 8 SENSITIVE Sensitive ug/mL    * RARE ENTEROBACTER CLOACAE   Enterococcus faecalis - MIC*    AMPICILLIN <=2 SENSITIVE Sensitive     VANCOMYCIN 1 SENSITIVE Sensitive     GENTAMICIN SYNERGY SENSITIVE Sensitive     * ABUNDANT ENTEROCOCCUS FAECALIS     Assessment/Plan: S/P Procedure(s) (LRB): ASCENDING AORTIC ROOT REPLACEMENT USING KONECT RESILIA AORTIC VALVE CONDUIT SIZE AND REATTACHMENT OF RIGHT AND LEFT CORONARY (N/A) TRANSESOPHAGEAL ECHOCARDIOGRAM (TEE) (N/A) POD#10  1 afeb, S BP 110's-150's, losartan increased yesterday, increase to 100 mg(home dose)cont w/ norvasc and low dose beta blocker 2 O2 sats good on RA 3 weight trending lower 4 BS good control 5 creat stable at 1.06, will d/c lasix 7 WBC stable at 18.7, + left shift 8 H/H stable, platelets mildly elevated 9 will d/w MD whether needs further eval for leukocytosis, on augmentin for  enterococcus in sputum,steroids may have played a part but is no longer on  o/w looks stable for D/C  Addend: d/w MD , ok to d/c on augmentin - will complete 10 day course   LOS: 10 days    Rowe Clack PA-C Pager [phone] 06/24/2023

## 2023-06-24 NOTE — Care Management Important Message (Signed)
Important Message  Patient Details  Name: [name] MRN: [id number] Date of Birth: January 13, 1956   Important Message Given:  Yes - Medicare IM     Renie Ora 06/24/2023, 10:41 AM

## 2023-06-24 NOTE — Progress Notes (Signed)
Occupational Therapy Treatment Patient Details Name: [name] MRN: [id number] DOB: [birth year] Today's Date: 06/24/2023   History of present illness Pt is a 68yo female who presented for aortic valve replacement completed on 1/21. PMH includes: recent removal of hardware at L3, L4, and S1 with PLIF of L3-4 on 8/23, anxiety, aortic stenosis, obesity, DM II, HTN, and osteoarthritis of L hip.   OT comments  Pt making excellent progress towards OT goals. Emphasized ADLs and safety techniques to maintain implementation of sternal precautions. Pt able to complete bed mobility, mobility in room using walker and various ADLs at sink without physical assistance or safety concerns. Pt confirms spouse at home can assist with IADLs as needed. Pt denied any further ADL/mobility questions and has all needed DME. Functionally appropriate for DC home when cleared by medical team.       If plan is discharge home, recommend the following:  Assistance with cooking/housework;Assist for transportation;Help with stairs or ramp for entrance;A little help with bathing/dressing/bathroom   Equipment Recommendations  None recommended by OT    Recommendations for Other Services      Precautions / Restrictions Precautions Precautions: Sternal Precaution Booklet Issued: No (pt reports already having handout) Restrictions Weight Bearing Restrictions Per Provider Order: No       Mobility Bed Mobility Overal bed mobility: Modified Independent             General bed mobility comments: good technique to roll to sit EOB w/o pulling with BUE. left sitting EOB on exit (awaiting breakfast)    Transfers Overall transfer level: Independent Equipment used: None Transfers: Sit to/from Stand Sit to Stand: Independent                 Balance Overall balance assessment: No apparent balance deficits (not formally assessed)                                         ADL either  performed or assessed with clinical judgement   ADL Overall ADL's : Modified independent;Needs assistance/impaired     Grooming: Modified independent;Standing;Oral care;Wash/dry face           Upper Body Dressing : Modified independent;Sitting                   Functional mobility during ADLs: Supervision/safety;Rolling walker (2 wheels) General ADL Comments: Emphasis on ADL strategies for bathing, dressing at home, fall prevention and consideration of IADL assist from spouse if needed. Discusesd use of DME already present at home (Rollator, shower seat)    Extremity/Trunk Assessment Upper Extremity Assessment Upper Extremity Assessment: Overall WFL for tasks assessed;Right hand dominant   Lower Extremity Assessment Lower Extremity Assessment: Defer to PT evaluation        Vision   Vision Assessment?: No apparent visual deficits   Perception     Praxis      Cognition Arousal: Alert Behavior During Therapy: WFL for tasks assessed/performed Overall Cognitive Status: Within Functional Limits for tasks assessed                                          Exercises      Shoulder Instructions       General Comments HR WFL    Pertinent Vitals/ Pain  Pain Assessment Pain Assessment: No/denies pain  Home Living Family/patient expects to be discharged to:: Private residence                                        Prior Functioning/Environment              Frequency  Min 1X/week        Progress Toward Goals  OT Goals(current goals can now be found in the care plan section)  Progress towards OT goals: Progressing toward goals  Acute Rehab OT Goals Patient Stated Goal: home today OT Goal Formulation: With patient Time For Goal Achievement: 07/06/23 Potential to Achieve Goals: Good ADL Goals Pt Will Perform Grooming: with modified independence;standing Pt Will Perform Upper Body Bathing: with modified  independence;sitting Pt Will Perform Lower Body Bathing: with supervision;sitting/lateral leans;sit to/from stand Pt Will Perform Upper Body Dressing: with modified independence;sitting Pt Will Perform Lower Body Dressing: with supervision;sitting/lateral leans;sit to/from stand Pt Will Transfer to Toilet: with modified independence;regular height toilet;ambulating Pt Will Perform Toileting - Clothing Manipulation and hygiene: with modified independence;sitting/lateral leans;sit to/from stand Additional ADL Goal #1: Patient will demonstrate ability to Independently state 4 energy conservation strategies to increase safety and independence with functional tasks in the home.  Plan      Co-evaluation                 AM-PAC OT "6 Clicks" Daily Activity     Outcome Measure   Help from another person eating meals?: None Help from another person taking care of personal grooming?: None Help from another person toileting, which includes using toliet, bedpan, or urinal?: A Little Help from another person bathing (including washing, rinsing, drying)?: A Little Help from another person to put on and taking off regular upper body clothing?: None Help from another person to put on and taking off regular lower body clothing?: A Little 6 Click Score: 21    End of Session Equipment Utilized During Treatment: Rolling walker (2 wheels)  OT Visit Diagnosis: Muscle weakness (generalized) (M62.81);Other (comment)   Activity Tolerance Patient tolerated treatment well   Patient Left in bed;with call bell/phone within reach   Nurse Communication          Time: 4098-1191 OT Time Calculation (min): 18 min  Charges: OT General Charges $OT Visit: 1 Visit OT Treatments $Self Care/Home Management : 8-22 mins  Bradd Canary, OTR/L Acute Rehab Services Office: [phone]   Lorre Munroe 06/24/2023, 8:12 AM

## 2023-06-24 NOTE — Care Management Important Message (Signed)
Important Message  Patient Details  Name: [name] MRN: [id number] Date of Birth: [birth year]   Important Message Given:  Yes - Medicare IM     Renie Ora 06/24/2023, 10:03 AM

## 2023-06-28 ENCOUNTER — Inpatient Hospital Stay (HOSPITAL_BASED_OUTPATIENT_CLINIC_OR_DEPARTMENT_OTHER)
Admission: EM | Admit: 2023-06-28 | Discharge: 2023-07-01 | DRG: 291 | Disposition: A | Discharge status: Home Health | Payer: Medicare HMO | Attending: Internal Medicine | Admitting: Internal Medicine

## 2023-06-28 ENCOUNTER — Telehealth (HOSPITAL_COMMUNITY): Payer: Self-pay

## 2023-06-28 ENCOUNTER — Other Ambulatory Visit: Payer: Self-pay

## 2023-06-28 ENCOUNTER — Encounter (HOSPITAL_BASED_OUTPATIENT_CLINIC_OR_DEPARTMENT_OTHER): Payer: Self-pay | Admitting: Emergency Medicine

## 2023-06-28 ENCOUNTER — Emergency Department (HOSPITAL_BASED_OUTPATIENT_CLINIC_OR_DEPARTMENT_OTHER): Payer: Medicare HMO | Admitting: Radiology

## 2023-06-28 DIAGNOSIS — E1122 Type 2 diabetes mellitus with diabetic chronic kidney disease: Secondary | ICD-10-CM | POA: Diagnosis present

## 2023-06-28 DIAGNOSIS — R0902 Hypoxemia: Secondary | ICD-10-CM | POA: Diagnosis present

## 2023-06-28 DIAGNOSIS — Z792 Long term (current) use of antibiotics: Secondary | ICD-10-CM

## 2023-06-28 DIAGNOSIS — R0600 Dyspnea, unspecified: Secondary | ICD-10-CM | POA: Diagnosis present

## 2023-06-28 DIAGNOSIS — M4626 Osteomyelitis of vertebra, lumbar region: Secondary | ICD-10-CM | POA: Diagnosis present

## 2023-06-28 DIAGNOSIS — I5033 Acute on chronic diastolic (congestive) heart failure: Secondary | ICD-10-CM | POA: Diagnosis not present

## 2023-06-28 DIAGNOSIS — I350 Nonrheumatic aortic (valve) stenosis: Secondary | ICD-10-CM | POA: Diagnosis not present

## 2023-06-28 DIAGNOSIS — Z882 Allergy status to sulfonamides status: Secondary | ICD-10-CM

## 2023-06-28 DIAGNOSIS — I130 Hypertensive heart and chronic kidney disease with heart failure and stage 1 through stage 4 chronic kidney disease, or unspecified chronic kidney disease: Principal | ICD-10-CM | POA: Diagnosis present

## 2023-06-28 DIAGNOSIS — I480 Paroxysmal atrial fibrillation: Secondary | ICD-10-CM | POA: Diagnosis present

## 2023-06-28 DIAGNOSIS — D631 Anemia in chronic kidney disease: Secondary | ICD-10-CM | POA: Diagnosis not present

## 2023-06-28 DIAGNOSIS — Z952 Presence of prosthetic heart valve: Secondary | ICD-10-CM

## 2023-06-28 DIAGNOSIS — F411 Generalized anxiety disorder: Secondary | ICD-10-CM | POA: Diagnosis not present

## 2023-06-28 DIAGNOSIS — E1169 Type 2 diabetes mellitus with other specified complication: Secondary | ICD-10-CM | POA: Diagnosis present

## 2023-06-28 DIAGNOSIS — K219 Gastro-esophageal reflux disease without esophagitis: Secondary | ICD-10-CM | POA: Diagnosis present

## 2023-06-28 DIAGNOSIS — E875 Hyperkalemia: Secondary | ICD-10-CM | POA: Diagnosis not present

## 2023-06-28 DIAGNOSIS — Z7901 Long term (current) use of anticoagulants: Secondary | ICD-10-CM

## 2023-06-28 DIAGNOSIS — Z8249 Family history of ischemic heart disease and other diseases of the circulatory system: Secondary | ICD-10-CM

## 2023-06-28 DIAGNOSIS — E66813 Obesity, class 3: Secondary | ICD-10-CM | POA: Diagnosis not present

## 2023-06-28 DIAGNOSIS — Z7985 Long-term (current) use of injectable non-insulin antidiabetic drugs: Secondary | ICD-10-CM

## 2023-06-28 DIAGNOSIS — Z888 Allergy status to other drugs, medicaments and biological substances status: Secondary | ICD-10-CM

## 2023-06-28 DIAGNOSIS — E119 Type 2 diabetes mellitus without complications: Secondary | ICD-10-CM

## 2023-06-28 DIAGNOSIS — N179 Acute kidney failure, unspecified: Secondary | ICD-10-CM | POA: Diagnosis present

## 2023-06-28 DIAGNOSIS — D509 Iron deficiency anemia, unspecified: Secondary | ICD-10-CM | POA: Diagnosis not present

## 2023-06-28 DIAGNOSIS — M1A9XX0 Chronic gout, unspecified, without tophus (tophi): Secondary | ICD-10-CM | POA: Diagnosis not present

## 2023-06-28 DIAGNOSIS — E7800 Pure hypercholesterolemia, unspecified: Secondary | ICD-10-CM | POA: Diagnosis not present

## 2023-06-28 DIAGNOSIS — N1832 Chronic kidney disease, stage 3b: Secondary | ICD-10-CM | POA: Diagnosis present

## 2023-06-28 DIAGNOSIS — I4510 Unspecified right bundle-branch block: Secondary | ICD-10-CM | POA: Diagnosis not present

## 2023-06-28 DIAGNOSIS — I10 Essential (primary) hypertension: Secondary | ICD-10-CM | POA: Diagnosis present

## 2023-06-28 DIAGNOSIS — Z6841 Body Mass Index (BMI) 40.0 and over, adult: Secondary | ICD-10-CM | POA: Diagnosis not present

## 2023-06-28 DIAGNOSIS — Z1152 Encounter for screening for COVID-19: Secondary | ICD-10-CM

## 2023-06-28 DIAGNOSIS — Z981 Arthrodesis status: Secondary | ICD-10-CM

## 2023-06-28 DIAGNOSIS — Z79899 Other long term (current) drug therapy: Secondary | ICD-10-CM

## 2023-06-28 DIAGNOSIS — N1831 Chronic kidney disease, stage 3a: Secondary | ICD-10-CM | POA: Diagnosis present

## 2023-06-28 DIAGNOSIS — Z7982 Long term (current) use of aspirin: Secondary | ICD-10-CM

## 2023-06-28 DIAGNOSIS — D75839 Thrombocytosis, unspecified: Secondary | ICD-10-CM | POA: Diagnosis present

## 2023-06-28 DIAGNOSIS — Z7984 Long term (current) use of oral hypoglycemic drugs: Secondary | ICD-10-CM

## 2023-06-28 LAB — BASIC METABOLIC PANEL
Anion gap: 8 (ref 5–15)
BUN: 28 mg/dL — ABNORMAL HIGH (ref 8–23)
CO2: 27 mmol/L (ref 22–32)
Calcium: 9.1 mg/dL (ref 8.9–10.3)
Chloride: 103 mmol/L (ref 98–111)
Creatinine, Ser: 1.52 mg/dL — ABNORMAL HIGH (ref 0.44–1.00)
GFR, Estimated: 38 mL/min — ABNORMAL LOW (ref >60)
Glucose, Bld: 90 mg/dL (ref 70–99)
Potassium: 5.3 mmol/L — ABNORMAL HIGH (ref 3.5–5.1)
Sodium: 138 mmol/L (ref 135–145)

## 2023-06-28 LAB — CBC
HCT: 33.1 % — ABNORMAL LOW (ref 36.0–46.0)
Hemoglobin: 10.2 g/dL — ABNORMAL LOW (ref 12.0–15.0)
MCH: 24.7 pg — ABNORMAL LOW (ref 26.0–34.0)
MCHC: 30.8 g/dL (ref 30.0–36.0)
MCV: 80.1 fL (ref 80.0–100.0)
Platelets: 650 10*3/uL — ABNORMAL HIGH (ref 150–400)
RBC: 4.13 MIL/uL (ref 3.87–5.11)
RDW: 18.2 % — ABNORMAL HIGH (ref 11.5–15.5)
WBC: 18.8 10*3/uL — ABNORMAL HIGH (ref 4.0–10.5)
nRBC: 0.1 % (ref 0.0–0.2)

## 2023-06-28 LAB — TROPONIN I (HIGH SENSITIVITY)
Troponin I (High Sensitivity): 51 ng/L — ABNORMAL HIGH (ref <18)
Troponin I (High Sensitivity): 51 ng/L — ABNORMAL HIGH (ref <18)

## 2023-06-28 LAB — BRAIN NATRIURETIC PEPTIDE: B Natriuretic Peptide: 356 pg/mL — ABNORMAL HIGH (ref 0.0–100.0)

## 2023-06-28 LAB — RESP PANEL BY RT-PCR (RSV, FLU A&B, COVID)  RVPGX2
Influenza A by PCR: NEGATIVE
Influenza B by PCR: NEGATIVE
Resp Syncytial Virus by PCR: NEGATIVE
SARS Coronavirus 2 by RT PCR: NEGATIVE

## 2023-06-28 MED ORDER — FUROSEMIDE 10 MG/ML IJ SOLN
20.0000 mg | Freq: Once | INTRAMUSCULAR | Status: AC
  Administered 2023-06-28: 20 mg via INTRAVENOUS
  Filled 2023-06-28: qty 2

## 2023-06-28 MED ORDER — FENTANYL CITRATE PF 50 MCG/ML IJ SOSY
50.0000 ug | PREFILLED_SYRINGE | Freq: Once | INTRAMUSCULAR | Status: AC
  Administered 2023-06-28: 50 ug via INTRAVENOUS
  Filled 2023-06-28: qty 1

## 2023-06-28 MED ORDER — ONDANSETRON HCL 4 MG/2ML IJ SOLN: 4.0000 mg | Freq: Once | INTRAMUSCULAR | Status: DC

## 2023-06-28 NOTE — Plan of Care (Addendum)
 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   At presentation to ED patient initially hypertensive 175/96 which improved to 153/96.  O2 sat 100% on 2 L.  Heart rate 78 and respiratory rate 17. Pending respiratory panel. Troponin x 2 are 51 and 51 without any delta change. CBC showing persistent leukocytosis since admission 18.8 stable H&H 10.2 and 33 and elevated platelet count 650. BMP showing elevated potassium 5.3, elevated creatinine 1.5, elevated BUN 28 and low GFR 38. EKG showing sinus rhythm heart rate 84 right bundle blanch block LV BNP around 356.  Perform bedside echocardiogram did not showed any evidence of pericardial effusion.  Chest x-ray showing: Limited evaluation due to overlapping osseous structures and overlying soft tissues. Suggestion of left lower lung zone airspace opacity and small left pleural effusion. Followup PA and lateral chest X-ray is recommended in 3-4 weeks following therapy to ensure resolution.   Dr. Jerrol stated that patient is euvolemic on physical exam.  Patient does not have any wheezing and no concern for asthma exacerbation. Consulted cardiology recommended to give Lasix  if needed and cardiology team will see patient in the morning.   Requested Dr.  Jerrol to reach out to on-call CT surgery for recommendation given patient have the aortic for replacement 2 weeks ago.  Hospitalist has been contacted for further workup and management of dyspnea.  Update, on-call CT surgery recommended IV diuresis treatment.  If we need further CT surgery assistance need to inform in the daytime for formal consult.  In the ED patient has been given IV Lasix  20 mg.  Plan of care: The patient is accepted for admission for inpatient status to cardiac- Telemetry unit, at [facility].  Check [email or web address] for on-call coverage.  [facility] will assume care on arrival to accepting facility. Until arrival, medical decision making responsibilities remain with the EDP.  However, [facility] available 24/7 for questions and assistance.   Nursing staff please page [facility] Admits and Consults [phone]) as soon as the patient arrives to the hospital.    Author: Seaborn Nakama, MD  06/28/2023  [facility]

## 2023-06-28 NOTE — ED Provider Notes (Incomplete)
[place] EMERGENCY DEPARTMENT AT [facility] Provider Note   CSN: [id number] Arrival date & time: 06/28/23  1816     History  No chief complaint on file.   [name] is a 68 y.o. female.  HPI   68 year old female with medical history significant for diabetes mellitus, hypertension, hyperlipidemia, GERD, severe aortic stenosis status post open aortic root and valve replacement with CT surgery 2 weeks ago on 06/14/2023 who presents to the emergency department with dyspnea.  The patient endorses profound dyspnea over the past several days.  She denies any new chest pressure or discomfort.  She has been compliant with her home Eliquis.  She denies any fevers, chills or cough.   Home Medications Prior to Admission medications   Medication Sig Start Date End Date Taking? Authorizing Provider  acetaminophen (TYLENOL) 500 MG tablet Take 1,000 mg by mouth every 6 (six) hours as needed for mild pain (pain score 1-3) or headache.    [provider]  albuterol (PROAIR HFA) 108 (90 BASE) MCG/ACT inhaler Inhale 2 puffs into the lungs every 6 (six) hours as needed. 07/21/13   Carmelina Dane, MD  amiodarone (PACERONE) 200 MG tablet Take 1 tablet (200 mg total) by mouth 2 (two) times daily. 06/24/23   Gold, Wayne E, PA-C  amLODipine (NORVASC) 10 MG tablet Take 1 tablet (10 mg total) by mouth daily. Patient taking differently: Take 10 mg by mouth at bedtime. 11/23/17   Wallis Bamberg, PA-C  amoxicillin-clavulanate (AUGMENTIN) 875-125 MG tablet Take 1 tablet by mouth every 12 (twelve) hours. 06/24/23   Gold, Glenice Laine, PA-C  apixaban (ELIQUIS) 5 MG TABS tablet Take 1 tablet (5 mg total) by mouth 2 (two) times daily. 06/23/23   Eugenio Hoes, MD  ascorbic acid (VITAMIN C) 500 MG tablet Take 500 mg by mouth daily.    [provider]  aspirin 81 MG chewable tablet Chew 1 tablet (81 mg total) by mouth daily. 06/24/23   Gold, Glenice Laine, PA-C  cetirizine (ZYRTEC) 10 MG tablet Take 1  tablet (10 mg total) by mouth daily. 10/09/22   Allwardt, Crist Infante, PA-C  docusate sodium (COLACE) 100 MG capsule Take 200-300 mg by mouth at bedtime.    [provider]  ezetimibe (ZETIA) 10 MG tablet Take 1 tablet (10 mg total) by mouth daily. 06/24/23   Gold, Wayne E, PA-C  fluticasone (FLONASE) 50 MCG/ACT nasal spray Place 2 sprays into both nostrils daily. Patient taking differently: Place 2 sprays into both nostrils daily as needed for rhinitis. 10/09/22   Allwardt, Crist Infante, PA-C  gabapentin (NEURONTIN) 600 MG tablet Take 600 mg by mouth 3 (three) times daily. 12/01/21   [provider]  iron polysaccharides (NIFEREX) 150 MG capsule TAKE 1 CAPSULE (150 MG DOSE) BY MOUTH TWICE A DAY Patient taking differently: Take 150 mg by mouth daily. 12/19/19 06/14/23  Jettie Pagan, NP  losartan (COZAAR) 100 MG tablet Take 1 tablet (100 mg total) by mouth daily. 06/24/23   Gold, Glenice Laine, PA-C  metFORMIN (GLUCOPHAGE) 1000 MG tablet Take 1 tablet (1,000 mg total) by mouth 2 (two) times daily with a meal. Patient taking differently: Take 1,000 mg by mouth daily. 05/01/21     metoprolol tartrate (LOPRESSOR) 25 MG tablet Take 0.5 tablets (12.5 mg total) by mouth 2 (two) times daily. 06/24/23   Gold, Deniece Portela E, PA-C  oxyCODONE (OXY IR/ROXICODONE) 5 MG immediate release tablet Take 1 tablet (5 mg total) by mouth every 6 (  six) hours as needed for up to 7 days for severe pain (pain score 7-10). 06/24/23 07/01/23  Rowe Clack, PA-C  pantoprazole (PROTONIX) 40 MG tablet Take 40 mg by mouth daily. 12/28/21   [provider]  polyvinyl alcohol (LIQUIFILM TEARS) 1.4 % ophthalmic solution Place 1 drop into both eyes as needed for dry eyes.    [provider]  rosuvastatin (CRESTOR) 10 MG tablet Take 1 tablet (10 mg total) by mouth every other day. 02/03/23   Sharlene Dory, PA-C  Semaglutide, 2 MG/DOSE, (OZEMPIC, 2 MG/DOSE,) 8 MG/3ML SOPN Inject 2 mg into the skin once a week.    [provider]  sulfamethoxazole-trimethoprim (BACTRIM) 400-80 MG tablet TAKE 1 TABLET BY MOUTH TWICE DAILY 05/26/23   Vu, Tonita Phoenix, MD      Allergies    Crestor [rosuvastatin], Robaxin [methocarbamol], Toradol [ketorolac tromethamine], Zocor [simvastatin], Lipitor [atorvastatin], and Sulfa antibiotics    Review of Systems   Review of Systems  Respiratory:  Positive for shortness of breath.   All other systems reviewed and are negative.   Physical Exam Updated Vital Signs BP (!) 175/96 (BP Location: Right Arm)   Pulse 85   Resp (!) 21   SpO2 96%  Physical Exam Vitals and nursing note reviewed.  Constitutional:      General: She is not in acute distress.    Appearance: She is well-developed.  HENT:     Head: Normocephalic and atraumatic.  Eyes:     Conjunctiva/sclera: Conjunctivae normal.  Neck:     Vascular: No JVD.  Cardiovascular:     Rate and Rhythm: Normal rate and regular rhythm.     Heart sounds: Murmur heard.  Pulmonary:     Effort: Pulmonary effort is normal. No respiratory distress.     Breath sounds: Normal breath sounds.  Abdominal:     Palpations: Abdomen is soft.     Tenderness: There is no abdominal tenderness.  Musculoskeletal:        General: No swelling.     Cervical back: Neck supple.     Right lower leg: No edema.     Left lower leg: No edema.  Skin:    General: Skin is warm and dry.     Capillary Refill: Capillary refill takes less than 2 seconds.  Neurological:     Mental Status: She is alert.  Psychiatric:        Mood and Affect: Mood normal.     ED Results / Procedures / Treatments   Labs (all labs ordered are listed, but only abnormal results are displayed) Labs Reviewed  BASIC METABOLIC PANEL  CBC  TROPONIN I (HIGH SENSITIVITY)    EKG None  Radiology No results found.  Procedures Procedures    Medications Ordered in ED Medications - No data to display  ED Course/ Medical Decision Making/ A&P                                  Medical Decision Making Amount and/or Complexity of Data Reviewed Labs: ordered. Radiology: ordered.  Risk Prescription drug management. Decision regarding hospitalization.   68 year old female with medical history significant for diabetes mellitus, hypertension, hyperlipidemia, GERD, severe aortic stenosis status post open aortic root and valve replacement with CT surgery 2 weeks ago on 06/14/2023 who presents to the emergency department with dyspnea.  The patient endorses profound dyspnea over the past several days.  She denies any new chest pressure or discomfort.  She has been compliant with her home Eliquis.  She denies any fevers, chills or cough.   On arrival, the patient was afebrile, heart rate 85, mildly tachypneic RR 21, BP 175/96, saturating 96% on room air.  Patient appears clinically euvolemic, no JVD, no lower extremity edema, no rales.  Patient presenting with persistent dyspnea, worse since being discharged from the hospital.  Had been diuresed appropriately inpatient.  Not on diuresis currently.  Has been compliant with her Eliquis.  Denies any chest pain at this time.  Recently had a clean cardiac catheterization.  {Document critical care time when appropriate:1} {Document review of labs and clinical decision tools ie heart score, Chads2Vasc2 etc:1}  {Document your independent review of radiology images, and any outside records:1} {Document your discussion with family members, caretakers, and with consultants:1} {Document social determinants of health affecting pt's care:1} {Document your decision making why or why not admission, treatments were needed:1} Final Clinical Impression(s) / ED Diagnoses Final diagnoses:  None    Rx / DC Orders ED Discharge Orders     None

## 2023-06-28 NOTE — Telephone Encounter (Signed)
 Pt insurance is active and benefits verified through [facility]. Co-pay $25.00, DED $0.00/$0.00 met, out of pocket $6,750.00/$11.83 met, co-insurance 0%. No pre-authorization required. Passport, 06/28/23 @ 2:49PM, REF#[id number]   How many CR sessions are covered? (36 visits for TCR, 72 visits for ICR)72 Is this a lifetime maximum or an annual maximum? Annual Has the member used any of these services to date? No Is there a time limit (weeks/months) on start of program and/or program completion? No     Will contact patient to see if she is interested in the Cardiac Rehab Program. If interested, patient will need to complete follow up appt. Once completed, patient will be contacted for scheduling upon review by the RN Navigator.

## 2023-06-28 NOTE — ED Triage Notes (Signed)
Sob, "hard to breathe"  Getting worse since d/c Recent heart surgery 06/14/23, d/c on 06/24/23  aortic valve replacement

## 2023-06-28 NOTE — Telephone Encounter (Signed)
 Attempted to call patient in regards to Cardiac Rehab - LM on VM

## 2023-06-28 NOTE — ED Provider Notes (Signed)
 [place] EMERGENCY DEPARTMENT AT [facility] Provider Note   CSN: [id number] Arrival date & time: 06/28/23  1816     History  Chief Complaint  Patient presents with   Shortness of Breath    Olivia  ICELYNN [name] is a 68 y.o. female.  HPI   68 year old female with medical history significant for diabetes mellitus, hypertension, hyperlipidemia, GERD, severe aortic stenosis status post open aortic root and valve replacement with CT surgery 2 weeks ago on 06/14/2023 who presents to the emergency department with dyspnea.  The patient endorses profound dyspnea over the past several days.  She denies any new chest pressure or discomfort.  She has been compliant with her home Eliquis .  She denies any fevers, chills or cough.   Home Medications Prior to Admission medications   Medication Sig Start Date End Date Taking? Authorizing Provider  acetaminophen  (TYLENOL ) 500 MG tablet Take 1,000 mg by mouth every 6 (six) hours as needed for mild pain (pain score 1-3) or headache.    [provider]  albuterol  (PROAIR  HFA) 108 (90 BASE) MCG/ACT inhaler Inhale 2 puffs into the lungs every 6 (six) hours as needed. 07/21/13   Lenon Juliane RAMAN, MD  amiodarone  (PACERONE ) 200 MG tablet Take 1 tablet (200 mg total) by mouth 2 (two) times daily. 06/24/23   Gold, Wayne E, PA-C  amLODipine  (NORVASC ) 10 MG tablet Take 1 tablet (10 mg total) by mouth daily. Patient taking differently: Take 10 mg by mouth at bedtime. 11/23/17   Christopher Savannah, PA-C  amoxicillin -clavulanate (AUGMENTIN ) 875-125 MG tablet Take 1 tablet by mouth every 12 (twelve) hours. 06/24/23   Gold, Wayne E, PA-C  apixaban  (ELIQUIS ) 5 MG TABS tablet Take 1 tablet (5 mg total) by mouth 2 (two) times daily. 06/23/23   Maryjane Mt, MD  ascorbic acid (VITAMIN C) 500 MG tablet Take 500 mg by mouth daily.    [provider]  aspirin  81 MG chewable tablet Chew 1 tablet (81 mg total) by mouth daily. 06/24/23   Gold, Wayne E, PA-C   cetirizine  (ZYRTEC ) 10 MG tablet Take 1 tablet (10 mg total) by mouth daily. 10/09/22   Allwardt, Mardy CHRISTELLA, PA-C  docusate sodium  (COLACE) 100 MG capsule Take 200-300 mg by mouth at bedtime.    [provider]  ezetimibe  (ZETIA ) 10 MG tablet Take 1 tablet (10 mg total) by mouth daily. 06/24/23   Gold, Wayne E, PA-C  fluticasone  (FLONASE ) 50 MCG/ACT nasal spray Place 2 sprays into both nostrils daily. Patient taking differently: Place 2 sprays into both nostrils daily as needed for rhinitis. 10/09/22   Allwardt, Alyssa M, PA-C  gabapentin  (NEURONTIN ) 600 MG tablet Take 600 mg by mouth 3 (three) times daily. 12/01/21   [provider]  iron  polysaccharides (NIFEREX) 150 MG capsule TAKE 1 CAPSULE (150 MG DOSE) BY MOUTH TWICE A DAY Patient taking differently: Take 150 mg by mouth daily. 12/19/19 06/14/23  Arby Lyle LABOR, NP  losartan  (COZAAR ) 100 MG tablet Take 1 tablet (100 mg total) by mouth daily. 06/24/23   Gold, Wayne E, PA-C  metFORMIN  (GLUCOPHAGE ) 1000 MG tablet Take 1 tablet (1,000 mg total) by mouth 2 (two) times daily with a meal. Patient taking differently: Take 1,000 mg by mouth daily. 05/01/21     metoprolol  tartrate (LOPRESSOR ) 25 MG tablet Take 0.5 tablets (12.5 mg total) by mouth 2 (two) times daily. 06/24/23   Gold, Wayne E, PA-C  oxyCODONE  (OXY IR/ROXICODONE ) 5 MG immediate release tablet Take 1 tablet (  5 mg total) by mouth every 6 (six) hours as needed for up to 7 days for severe pain (pain score 7-10). 06/24/23 07/01/23  Gold, Wayne E, PA-C  pantoprazole  (PROTONIX ) 40 MG tablet Take 40 mg by mouth daily. 12/28/21   [provider]  polyvinyl alcohol (LIQUIFILM TEARS) 1.4 % ophthalmic solution Place 1 drop into both eyes as needed for dry eyes.    [provider]  rosuvastatin  (CRESTOR ) 10 MG tablet Take 1 tablet (10 mg total) by mouth every other day. 02/03/23   Lucien Orren SAILOR, PA-C  Semaglutide , 2 MG/DOSE, (OZEMPIC , 2 MG/DOSE,) 8 MG/3ML SOPN Inject 2 mg  into the skin once a week.    [provider]  sulfamethoxazole -trimethoprim  (BACTRIM ) 400-80 MG tablet TAKE 1 TABLET BY MOUTH TWICE DAILY 05/26/23   Vu, Constance DASEN, MD      Allergies    Crestor  [rosuvastatin ], Robaxin  [methocarbamol ], Toradol  [ketorolac  tromethamine ], Zocor [simvastatin], Lipitor [atorvastatin ], and Sulfa  antibiotics    Review of Systems   Review of Systems  Respiratory:  Positive for shortness of breath.   All other systems reviewed and are negative.   Physical Exam Updated Vital Signs BP (!) 160/81   Pulse 80   Temp 98.1 F (36.7 C) (Oral)   Resp 19   SpO2 100%  Physical Exam Vitals and nursing note reviewed.  Constitutional:      General: She is not in acute distress.    Appearance: She is well-developed.  HENT:     Head: Normocephalic and atraumatic.  Eyes:     Conjunctiva/sclera: Conjunctivae normal.  Neck:     Vascular: No JVD.  Cardiovascular:     Rate and Rhythm: Normal rate and regular rhythm.     Heart sounds: Murmur heard.  Pulmonary:     Effort: Pulmonary effort is normal. No respiratory distress.     Breath sounds: Normal breath sounds.  Abdominal:     Palpations: Abdomen is soft.     Tenderness: There is no abdominal tenderness.  Musculoskeletal:        General: No swelling.     Cervical back: Neck supple.     Right lower leg: No edema.     Left lower leg: No edema.  Skin:    General: Skin is warm and dry.     Capillary Refill: Capillary refill takes less than 2 seconds.  Neurological:     Mental Status: She is alert.  Psychiatric:        Mood and Affect: Mood normal.     ED Results / Procedures / Treatments   Labs (all labs ordered are listed, but only abnormal results are displayed) Labs Reviewed  BASIC METABOLIC PANEL - Abnormal; Notable for the following components:      Result Value   Potassium 5.3 (*)    BUN 28 (*)    Creatinine, Ser 1.52 (*)    GFR, Estimated 38 (*)    All other components within normal  limits  CBC - Abnormal; Notable for the following components:   WBC 18.8 (*)    Hemoglobin 10.2 (*)    HCT 33.1 (*)    MCH 24.7 (*)    RDW 18.2 (*)    Platelets 650 (*)    All other components within normal limits  BRAIN NATRIURETIC PEPTIDE - Abnormal; Notable for the following components:   B Natriuretic Peptide 356.0 (*)    All other components within normal limits  TROPONIN I (HIGH SENSITIVITY) - Abnormal; Notable  for the following components:   Troponin I (High Sensitivity) 51 (*)    All other components within normal limits  TROPONIN I (HIGH SENSITIVITY) - Abnormal; Notable for the following components:   Troponin I (High Sensitivity) 51 (*)    All other components within normal limits  RESP PANEL BY RT-PCR (RSV, FLU A&B, COVID)  RVPGX2    EKG None  Radiology DG Chest 2 View Result Date: 06/28/2023 CLINICAL DATA:  sob EXAM: CHEST - 2 VIEW COMPARISON:  Cxr 06/20/23 FINDINGS: Limited evaluation due to overlapping osseous structures and overlying soft tissues. The heart and mediastinal contours are unchanged. Aortic valve replacement. Left lower lung zone airspace opacity. No pulmonary edema. Small left pleural effusion. No pneumothorax. No acute osseous abnormality. Sternotomy wires intact. IMPRESSION: Limited evaluation due to overlapping osseous structures and overlying soft tissues. Suggestion of left lower lung zone airspace opacity and small left pleural effusion. Followup PA and lateral chest X-ray is recommended in 3-4 weeks following therapy to ensure resolution. Electronically Signed   By: Morgane  Naveau M.D.   On: 06/28/2023 19:46    Procedures Ultrasound ED Echo  Date/Time: 06/28/2023 8:13 PM  Performed by: Jerrol Agent, MD Authorized by: Jerrol Agent, MD   Procedure details:    Indications: dyspnea     Views: subxiphoid and parasternal long axis view     Images: not archived   Findings:    Pericardium: small pericardial effusion   Impression:    Impression  comment:  Trace effusion     Medications Ordered in ED Medications  fentaNYL  (SUBLIMAZE ) injection 50 mcg (50 mcg Intravenous Given 06/28/23 2055)  furosemide  (LASIX ) injection 20 mg (20 mg Intravenous Given 06/28/23 2348)    ED Course/ Medical Decision Making/ A&P                                 Medical Decision Making Amount and/or Complexity of Data Reviewed Labs: ordered. Radiology: ordered.  Risk Prescription drug management. Decision regarding hospitalization.   68 year old female with medical history significant for diabetes mellitus, hypertension, hyperlipidemia, GERD, severe aortic stenosis status post open aortic root and valve replacement with CT surgery 2 weeks ago on 06/14/2023 who presents to the emergency department with dyspnea.  The patient endorses profound dyspnea over the past several days.  She denies any new chest pressure or discomfort.  She has been compliant with her home Eliquis .  She denies any fevers, chills or cough.   On arrival, the patient was afebrile, heart rate 85, mildly tachypneic RR 21, BP 175/96, saturating 96% on room air.  Patient appears clinically euvolemic, no JVD, no lower extremity edema, no rales.  Patient presenting with persistent dyspnea, worse since being discharged from the hospital.  Had been diuresed appropriately inpatient.  Not on diuresis currently.  Has been compliant with her Eliquis .  Denies any chest pain at this time.  Recently had a clean cardiac catheterization.  Considered viral infection, volume overload, valvular abnormality, pericardial effusion, cardiac tamponade, less likely PE as the patient is anticoagulated.  Considered pneumonia.  EKG revealed nonspecific ST changes in the inferior leads, discussed with on-call cardiology, Dr. Patwarden who agreed no STEMI.  Bedside cardiac ultrasound was performed which revealed no evidence of significant pericardial effusion, trace effusion present, no evidence of tamponade and the  patient is without vital sign tamponade physiology.  CXR: IMPRESSION:  Limited evaluation due to overlapping osseous structures and  overlying soft tissues. Suggestion of left lower lung zone airspace  opacity and small left pleural effusion. Followup PA and lateral  chest X-ray is recommended in 3-4 weeks following therapy to ensure  resolution.    The patient is afebrile, has a leukocytosis that has been present since surgery, WBC 18.8, hemoglobin 10.2, BNP was elevated to 356, BMP with mild hyperkalemia to 5.3, troponins elevated but flat at 51 and 51, COVID-19, influenza, RSV PCR testing was negative.  Patient with a notable AKI with a creatinine of 1.52.  Patient not meeting SIRS criteria.  Discussed the care of the patient with on-call cardiology in addition to on-call CT surgery, recommended diuresis with IV Lasix  20 mg.  Given the x-ray findings, the patient was administered CAP coverage.  Hospitalist medicine was consulted for admission for observation.   Final Clinical Impression(s) / ED Diagnoses Final diagnoses:  Dyspnea, unspecified type  S/P AVR (aortic valve replacement)  AKI (acute kidney injury) [facility])    Rx / DC Orders ED Discharge Orders     None         Jerrol Agent, MD 06/29/23 [phone]

## 2023-06-29 ENCOUNTER — Encounter (HOSPITAL_COMMUNITY): Payer: Self-pay | Admitting: Internal Medicine

## 2023-06-29 DIAGNOSIS — R0602 Shortness of breath: Secondary | ICD-10-CM

## 2023-06-29 DIAGNOSIS — I130 Hypertensive heart and chronic kidney disease with heart failure and stage 1 through stage 4 chronic kidney disease, or unspecified chronic kidney disease: Secondary | ICD-10-CM | POA: Diagnosis not present

## 2023-06-29 DIAGNOSIS — K219 Gastro-esophageal reflux disease without esophagitis: Secondary | ICD-10-CM | POA: Diagnosis not present

## 2023-06-29 DIAGNOSIS — E7800 Pure hypercholesterolemia, unspecified: Secondary | ICD-10-CM | POA: Diagnosis not present

## 2023-06-29 DIAGNOSIS — I4510 Unspecified right bundle-branch block: Secondary | ICD-10-CM | POA: Diagnosis not present

## 2023-06-29 DIAGNOSIS — E1122 Type 2 diabetes mellitus with diabetic chronic kidney disease: Secondary | ICD-10-CM | POA: Diagnosis not present

## 2023-06-29 DIAGNOSIS — I350 Nonrheumatic aortic (valve) stenosis: Secondary | ICD-10-CM | POA: Diagnosis not present

## 2023-06-29 DIAGNOSIS — N179 Acute kidney failure, unspecified: Secondary | ICD-10-CM | POA: Diagnosis not present

## 2023-06-29 DIAGNOSIS — I480 Paroxysmal atrial fibrillation: Secondary | ICD-10-CM | POA: Diagnosis not present

## 2023-06-29 DIAGNOSIS — Z1152 Encounter for screening for COVID-19: Secondary | ICD-10-CM | POA: Diagnosis not present

## 2023-06-29 DIAGNOSIS — R0902 Hypoxemia: Secondary | ICD-10-CM | POA: Diagnosis not present

## 2023-06-29 DIAGNOSIS — I5033 Acute on chronic diastolic (congestive) heart failure: Secondary | ICD-10-CM | POA: Diagnosis not present

## 2023-06-29 DIAGNOSIS — Z6841 Body Mass Index (BMI) 40.0 and over, adult: Secondary | ICD-10-CM | POA: Diagnosis not present

## 2023-06-29 DIAGNOSIS — R0600 Dyspnea, unspecified: Secondary | ICD-10-CM | POA: Diagnosis present

## 2023-06-29 DIAGNOSIS — D631 Anemia in chronic kidney disease: Secondary | ICD-10-CM | POA: Diagnosis not present

## 2023-06-29 DIAGNOSIS — M1A9XX0 Chronic gout, unspecified, without tophus (tophi): Secondary | ICD-10-CM | POA: Diagnosis not present

## 2023-06-29 DIAGNOSIS — M4626 Osteomyelitis of vertebra, lumbar region: Secondary | ICD-10-CM | POA: Diagnosis not present

## 2023-06-29 DIAGNOSIS — D509 Iron deficiency anemia, unspecified: Secondary | ICD-10-CM | POA: Diagnosis not present

## 2023-06-29 DIAGNOSIS — D75839 Thrombocytosis, unspecified: Secondary | ICD-10-CM | POA: Diagnosis not present

## 2023-06-29 DIAGNOSIS — E1169 Type 2 diabetes mellitus with other specified complication: Secondary | ICD-10-CM | POA: Diagnosis not present

## 2023-06-29 DIAGNOSIS — E66813 Obesity, class 3: Secondary | ICD-10-CM | POA: Diagnosis not present

## 2023-06-29 DIAGNOSIS — F411 Generalized anxiety disorder: Secondary | ICD-10-CM | POA: Diagnosis not present

## 2023-06-29 DIAGNOSIS — E875 Hyperkalemia: Secondary | ICD-10-CM | POA: Diagnosis not present

## 2023-06-29 DIAGNOSIS — N1832 Chronic kidney disease, stage 3b: Secondary | ICD-10-CM

## 2023-06-29 DIAGNOSIS — Z7901 Long term (current) use of anticoagulants: Secondary | ICD-10-CM | POA: Diagnosis not present

## 2023-06-29 DIAGNOSIS — Z952 Presence of prosthetic heart valve: Secondary | ICD-10-CM | POA: Diagnosis not present

## 2023-06-29 LAB — CBC
HCT: 35.2 % — ABNORMAL LOW (ref 36.0–46.0)
Hemoglobin: 11 g/dL — ABNORMAL LOW (ref 12.0–15.0)
MCH: 24.9 pg — ABNORMAL LOW (ref 26.0–34.0)
MCHC: 31.3 g/dL (ref 30.0–36.0)
MCV: 79.6 fL — ABNORMAL LOW (ref 80.0–100.0)
Platelets: 646 10*3/uL — ABNORMAL HIGH (ref 150–400)
RBC: 4.42 MIL/uL (ref 3.87–5.11)
RDW: 18.2 % — ABNORMAL HIGH (ref 11.5–15.5)
WBC: 14.5 10*3/uL — ABNORMAL HIGH (ref 4.0–10.5)
nRBC: 0.1 % (ref 0.0–0.2)

## 2023-06-29 LAB — GLUCOSE, CAPILLARY
Glucose-Capillary: 92 mg/dL (ref 70–99)
Glucose-Capillary: 159 mg/dL — ABNORMAL HIGH (ref 70–99)

## 2023-06-29 LAB — BASIC METABOLIC PANEL
Anion gap: 12 (ref 5–15)
BUN: 21 mg/dL (ref 8–23)
CO2: 25 mmol/L (ref 22–32)
Calcium: 9.1 mg/dL (ref 8.9–10.3)
Chloride: 102 mmol/L (ref 98–111)
Creatinine, Ser: 1.41 mg/dL — ABNORMAL HIGH (ref 0.44–1.00)
GFR, Estimated: 41 mL/min — ABNORMAL LOW (ref >60)
Glucose, Bld: 104 mg/dL — ABNORMAL HIGH (ref 70–99)
Potassium: 4.4 mmol/L (ref 3.5–5.1)
Sodium: 139 mmol/L (ref 135–145)

## 2023-06-29 LAB — PROCALCITONIN: Procalcitonin: <0.1 ng/mL

## 2023-06-29 LAB — SEDIMENTATION RATE: Sed Rate: 35 mm/h — ABNORMAL HIGH (ref 0–22)

## 2023-06-29 LAB — C-REACTIVE PROTEIN: CRP: 2.7 mg/dL — ABNORMAL HIGH (ref <1.0)

## 2023-06-29 MED ORDER — ONDANSETRON HCL 4 MG PO TABS: 4.0000 mg | ORAL_TABLET | Freq: Four times a day (QID) | ORAL | Status: DC | PRN

## 2023-06-29 MED ORDER — OXYCODONE HCL 5 MG PO TABS
5.0000 mg | ORAL_TABLET | ORAL | Status: AC | PRN
  Administered 2023-06-29 – 2023-06-30 (×2): 5 mg via ORAL
  Filled 2023-06-29 (×2): qty 1

## 2023-06-29 MED ORDER — SODIUM CHLORIDE 0.9 % IV SOLN
500.0000 mg | Freq: Once | INTRAVENOUS | Status: AC
  Administered 2023-06-29: 500 mg via INTRAVENOUS
  Filled 2023-06-29: qty 5

## 2023-06-29 MED ORDER — ALBUTEROL SULFATE HFA 108 (90 BASE) MCG/ACT IN AERS
INHALATION_SPRAY | RESPIRATORY_TRACT | Status: AC
  Filled 2023-06-29: qty 6.7

## 2023-06-29 MED ORDER — SODIUM CHLORIDE 0.9% FLUSH
3.0000 mL | Freq: Two times a day (BID) | INTRAVENOUS | Status: DC
  Administered 2023-06-29 – 2023-07-01 (×4): 3 mL via INTRAVENOUS

## 2023-06-29 MED ORDER — METOPROLOL TARTRATE 12.5 MG HALF TABLET
12.5000 mg | ORAL_TABLET | Freq: Two times a day (BID) | ORAL | Status: DC
  Administered 2023-06-29 – 2023-07-01 (×4): 12.5 mg via ORAL
  Filled 2023-06-29 (×4): qty 1

## 2023-06-29 MED ORDER — ONDANSETRON HCL 4 MG/2ML IJ SOLN: 4.0000 mg | Freq: Four times a day (QID) | INTRAMUSCULAR | Status: DC | PRN

## 2023-06-29 MED ORDER — APIXABAN 5 MG PO TABS
5.0000 mg | ORAL_TABLET | Freq: Two times a day (BID) | ORAL | Status: DC
  Administered 2023-06-29 – 2023-07-01 (×4): 5 mg via ORAL
  Filled 2023-06-29 (×4): qty 1

## 2023-06-29 MED ORDER — INSULIN ASPART 100 UNIT/ML IJ SOLN: 0.0000 [IU] | Freq: Three times a day (TID) | INTRAMUSCULAR | Status: DC

## 2023-06-29 MED ORDER — ACETAMINOPHEN 650 MG RE SUPP: 650.0000 mg | Freq: Four times a day (QID) | RECTAL | Status: DC | PRN

## 2023-06-29 MED ORDER — ACETAMINOPHEN 325 MG PO TABS
650.0000 mg | ORAL_TABLET | Freq: Four times a day (QID) | ORAL | Status: DC | PRN
  Administered 2023-06-30 (×2): 650 mg via ORAL
  Filled 2023-06-29 (×3): qty 2

## 2023-06-29 MED ORDER — PANTOPRAZOLE SODIUM 40 MG PO TBEC
40.0000 mg | DELAYED_RELEASE_TABLET | Freq: Every day | ORAL | Status: DC
  Administered 2023-06-29 – 2023-07-01 (×3): 40 mg via ORAL
  Filled 2023-06-29 (×3): qty 1

## 2023-06-29 MED ORDER — ROSUVASTATIN CALCIUM 5 MG PO TABS
10.0000 mg | ORAL_TABLET | ORAL | Status: DC
  Administered 2023-06-29 – 2023-07-01 (×2): 10 mg via ORAL
  Filled 2023-06-29 (×2): qty 2

## 2023-06-29 MED ORDER — HYDRALAZINE HCL 20 MG/ML IJ SOLN: 10.0000 mg | INTRAMUSCULAR | Status: DC | PRN

## 2023-06-29 MED ORDER — ALBUTEROL SULFATE HFA 108 (90 BASE) MCG/ACT IN AERS: 2.0000 | INHALATION_SPRAY | RESPIRATORY_TRACT | Status: DC | PRN

## 2023-06-29 MED ORDER — ACETAMINOPHEN 500 MG PO TABS
1000.0000 mg | ORAL_TABLET | Freq: Four times a day (QID) | ORAL | Status: DC | PRN
  Administered 2023-06-29: 1000 mg via ORAL
  Filled 2023-06-29: qty 2

## 2023-06-29 MED ORDER — SODIUM CHLORIDE 0.9 % IV SOLN
1.0000 g | Freq: Once | INTRAVENOUS | Status: AC
  Administered 2023-06-29: 1 g via INTRAVENOUS
  Filled 2023-06-29: qty 10

## 2023-06-29 MED ORDER — AMIODARONE HCL 200 MG PO TABS
200.0000 mg | ORAL_TABLET | Freq: Two times a day (BID) | ORAL | Status: DC
Start: 2023-06-29 — End: 2023-07-01
  Administered 2023-06-29 – 2023-07-01 (×4): 200 mg via ORAL
  Filled 2023-06-29 (×4): qty 1

## 2023-06-29 NOTE — Assessment & Plan Note (Signed)
 2/2 to volume overload and hypertensive urgency.  Lasix  20 mg at bedtime if bp allows.

## 2023-06-29 NOTE — Progress Notes (Signed)
 Pt received from [facility] via non emergency transport.  Pt is alert and pleasant.  Noted MSI from recent heart valve surgery.  No other skin issues.  CHG performed and clean gown donned.  Telemetry applied and CCMD notified.  Will page triad admitting as Pt has no admission orders.

## 2023-06-29 NOTE — H&P (Signed)
 History and Physical    Patient: [name] FMW:[id number] DOB: Oct 17, 1955 DOA: 06/28/2023 DOS: the patient was seen and examined on 06/29/2023 PCP: [name], PA-C  Patient coming from:  [facility] Chief complaint: Chief Complaint  Patient presents with   Shortness of Breath  HPI:  Olivia  CHRISTELLA [name] is a 68 y.o. 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  Patient found to be in AKI and hypertensive emergency and respiratory failure.  Pt is a limited historian. Pt reports  did not call her pcp, cardiology or CTS and has an appt on the 13th.   >>ED Course: Direct admit At bedside patient is alert awake oriented afebrile O2 sats 100% on room air, hypertensive blood pressures improved. Vitals:   06/29/23 1200 06/29/23 1236 06/29/23 1327 06/29/23 1645  BP: 104/64  (!) 155/73 (!) 144/86  Pulse: 96  96 97  Temp:   98.7 F (37.1 C) 98.7 F (37.1 C)  Resp: 18  20 20   SpO2: 100% 100% 100%   TempSrc:   Oral Oral  ED evaluation  so far shows: Initial troponin of 51 x 2, proBNP of 356 leukocytosis of 18.8, thrombocytosis of 650, potassium of 5.3, creatinine 1.5, EKG was sinus rhythm with a right bundle branch block, patient had a bedside echocardiogram which showed no evidence of pericardial effusion, chest x-ray did show left lower lobe pleural effusion. Blood work shows acute kidney injury with a creatinine of 1.5 EGFR of 38, potassium 5.3. CBC showing WBC count of 18.8 hemoglobin of 10.2 RDW of 18.2 and platelets of 615.   Of note patient has had a white count of 18.8 consistently since 06/14/2023.   In the emergency room  pt has received the following treatment thus far: Medications  acetaminophen  (TYLENOL ) tablet 1,000 mg (1,000 mg Oral Given 06/29/23 1055)  albuterol  (VENTOLIN  HFA) 108 (90 Base) MCG/ACT inhaler 2 puff (has no administration in time range)  fentaNYL  (SUBLIMAZE ) injection 50 mcg (50 mcg Intravenous Given 06/28/23 2055)  furosemide  (LASIX ) injection 20 mg (20 mg Intravenous Given 06/28/23 2348)  cefTRIAXone  (ROCEPHIN ) 1 g in sodium chloride  0.9 % 100 mL IVPB (0 g Intravenous Stopped 06/29/23 0308)  azithromycin  (ZITHROMAX ) 500 mg in sodium chloride  0.9 % 250 mL IVPB (0 mg Intravenous Stopped 06/29/23 0401)  albuterol  (VENTOLIN  HFA) 108 (90 Base) MCG/ACT inhaler (  Given 06/29/23 1236)   Review of Systems  Respiratory:  Positive for shortness of breath.    Past Medical History:  Diagnosis Date   Allergy    Anemia    Anxiety    Arthritis    Asthma    BMI 40.0-44.9, adult (HCC) 04/07/2014   DM type 2 (diabetes mellitus, type 2) (HCC)    GERD (gastroesophageal reflux disease)    HTN (hypertension)    Hypercholesterolemia    Osteoarthritis of left hip 04/07/2014   Reflux    Severe aortic stenosis    Spinal headache    with C-Section and with spinal fusion in 2019   Past Surgical History:  Procedure Laterality Date   ASCENDING AORTIC ROOT REPLACEMENT N/A 06/14/2023   Procedure: ASCENDING AORTIC  ROOT REPLACEMENT USING KONECT RESILIA AORTIC VALVE CONDUIT SIZE AND REATTACHMENT OF RIGHT AND LEFT CORONARY;  Surgeon: Maryjane Mt, MD;  Location: [facility] OR;  Service: Open Heart Surgery;  Laterality: N/A;   BACK SURGERY  2023   lumbar fusion, got infection, then had to re-do fusion   CESAREAN SECTION     x3   COLONOSCOPY     LAMINECTOMY  03/2021   LEFT HEART CATH AND CORONARY ANGIOGRAPHY N/A 04/06/2023   Procedure: LEFT HEART CATH AND CORONARY ANGIOGRAPHY;  Surgeon: Wonda Sharper, MD;  Location: [facility]  INVASIVE CV LAB;  Service: Cardiovascular;  Laterality: N/A;   SPINAL FUSION  2019   TEE WITHOUT CARDIOVERSION N/A 06/14/2023   Procedure: TRANSESOPHAGEAL ECHOCARDIOGRAM (TEE);  Surgeon: Maryjane Mt, MD;  Location: [facility] OR;  Service: Open Heart Surgery;  Laterality: N/A;   TUBAL LIGATION      reports that she has never smoked. She has never used smokeless tobacco. She reports that she does not drink alcohol and does not use drugs.  Allergies  Allergen Reactions   Crestor  [Rosuvastatin ] Other (See Comments)    Myalgia; patient can only tolerate taking 10 mg every other day   Robaxin  [Methocarbamol ] Other (See Comments)    Insomnia   Toradol  [Ketorolac  Tromethamine ] Other (See Comments)   Zocor [Simvastatin] Other (See Comments)    Myalgias    Lipitor [Atorvastatin ] Other (See Comments)    Myalgia   Sulfa  Antibiotics Hives    Family History  Problem Relation Age of Onset   Hypertension Mother    Stroke Mother    Heart disease Father    Stroke Brother    Multiple sclerosis Brother    Multiple sclerosis Sister    Colon cancer Neg Hx    Colon polyps Neg Hx    Esophageal cancer Neg Hx    Rectal cancer Neg Hx    Stomach cancer Neg Hx     Prior to Admission medications   Medication Sig Start Date End Date Taking? Authorizing Provider  acetaminophen  (TYLENOL ) 500 MG tablet Take 1,000 mg by mouth every 6 (six) hours as needed for mild pain (pain score 1-3) or headache.   Yes [provider]  albuterol  (PROAIR  HFA) 108 (90 BASE) MCG/ACT inhaler Inhale 2 puffs into the lungs every 6 (six) hours as needed. 07/21/13  Yes Lenon Juliane RAMAN, MD  amiodarone  (PACERONE ) 200 MG tablet Take 1 tablet (200 mg total) by mouth 2 (two) times daily. 06/24/23  Yes Gold, Wayne E, PA-C  amLODipine  (NORVASC ) 10 MG tablet Take 1 tablet (10 mg total) by mouth daily. Patient taking differently: Take 10 mg by mouth at bedtime. 11/23/17  Yes Christopher Savannah, PA-C  amoxicillin -clavulanate (AUGMENTIN )  875-125 MG tablet Take 1 tablet by mouth every 12 (twelve) hours. 06/24/23  Yes Gold, Wayne E, PA-C  apixaban  (ELIQUIS ) 5 MG TABS tablet Take 1 tablet (5 mg total) by mouth 2 (two) times daily. 06/23/23  Yes Maryjane Mt, MD  ascorbic acid (VITAMIN C) 500 MG tablet Take 500 mg by mouth daily.   Yes [provider]  aspirin  81 MG chewable tablet Chew 1 tablet (81 mg total) by mouth daily. 06/24/23  Yes Gold, Wayne E, PA-C  cetirizine  (ZYRTEC ) 10 MG tablet Take 1 tablet (10 mg total) by mouth daily. 10/09/22  Yes Allwardt, Alyssa M, PA-C  docusate sodium  (COLACE) 100 MG capsule Take 200-300 mg by mouth at bedtime.   Yes [provider]  ezetimibe  (ZETIA ) 10 MG tablet  Take 1 tablet (10 mg total) by mouth daily. 06/24/23  Yes Gold, Wayne E, PA-C  fluticasone  (FLONASE ) 50 MCG/ACT nasal spray Place 2 sprays into both nostrils daily. Patient taking differently: Place 2 sprays into both nostrils daily as needed for rhinitis. 10/09/22  Yes Allwardt, Alyssa M, PA-C  gabapentin  (NEURONTIN ) 600 MG tablet Take 600 mg by mouth 3 (three) times daily. 12/01/21  Yes [provider]  iron  polysaccharides (NIFEREX) 150 MG capsule TAKE 1 CAPSULE (150 MG DOSE) BY MOUTH TWICE A DAY Patient taking differently: Take 150 mg by mouth daily. 12/19/19 06/29/23 Yes Arby Lyle LABOR, NP  losartan  (COZAAR ) 100 MG tablet Take 1 tablet (100 mg total) by mouth daily. 06/24/23  Yes Gold, Wayne E, PA-C  metoprolol  tartrate (LOPRESSOR ) 25 MG tablet Take 0.5 tablets (12.5 mg total) by mouth 2 (two) times daily. 06/24/23  Yes Gold, Wayne E, PA-C  ondansetron  (ZOFRAN ) 8 MG tablet Take 8 mg by mouth 3 (three) times daily. 06/02/23  Yes [provider]  oxyCODONE  (OXY IR/ROXICODONE ) 5 MG immediate release tablet Take 1 tablet (5 mg total) by mouth every 6 (six) hours as needed for up to 7 days for severe pain (pain score 7-10). 06/24/23 07/01/23 Yes Gold, Wayne E, PA-C  pantoprazole  (PROTONIX ) 40 MG tablet Take 40 mg  by mouth daily. 12/28/21  Yes [provider]  polyvinyl alcohol (LIQUIFILM TEARS) 1.4 % ophthalmic solution Place 1 drop into both eyes as needed for dry eyes.   Yes [provider]  predniSONE  (DELTASONE ) 20 MG tablet Take 40 mg by mouth daily. 06/25/23 06/30/23 Yes [provider]  rosuvastatin  (CRESTOR ) 10 MG tablet Take 1 tablet (10 mg total) by mouth every other day. 02/03/23  Yes Conte, Tessa N, PA-C  Semaglutide , 2 MG/DOSE, (OZEMPIC , 2 MG/DOSE,) 8 MG/3ML SOPN Inject 2 mg into the skin once a week.   Yes [provider]  sulfamethoxazole -trimethoprim  (BACTRIM ) 400-80 MG tablet TAKE 1 TABLET BY MOUTH TWICE DAILY 05/26/23  Yes Vu, Constance T, MD  carvedilol  (COREG ) 12.5 MG tablet Take 12.5 mg by mouth 2 (two) times daily.    [provider]  metFORMIN  (GLUCOPHAGE ) 1000 MG tablet Take 1 tablet (1,000 mg total) by mouth 2 (two) times daily with a meal. Patient not taking: Reported on 06/29/2023 05/01/21      Vitals:   06/29/23 1200 06/29/23 1236 06/29/23 1327 06/29/23 1645  BP: 104/64  (!) 155/73 (!) 144/86  Pulse: 96  96 97  Resp: 18  20 20   Temp:   98.7 F (37.1 C) 98.7 F (37.1 C)  TempSrc:   Oral Oral  SpO2: 100% 100% 100%    Physical Exam Vitals and nursing note reviewed.  Constitutional:      General: She is not in acute distress. HENT:     Head: Normocephalic and atraumatic.     Right Ear: Hearing normal.     Left Ear: Hearing normal.     Nose: Nose normal. No nasal deformity.     Mouth/Throat:     Lips: Pink.     Tongue: No lesions.     Pharynx: Oropharynx is clear.  Eyes:     General: Lids are normal.     Extraocular Movements: Extraocular movements intact.  Cardiovascular:     Rate and Rhythm: Normal rate and regular rhythm.     Heart sounds: Normal heart sounds.  Pulmonary:     Effort: Pulmonary effort is normal.     Breath sounds:  Rales present.  Abdominal:     General: Bowel sounds are normal. There is no distension.      Palpations: Abdomen is soft. There is no mass.     Tenderness: There is no abdominal tenderness.  Musculoskeletal:     Right lower leg: No edema.     Left lower leg: No edema.  Skin:    General: Skin is warm.  Neurological:     General: No focal deficit present.     Mental Status: She is alert and oriented to person, place, and time.     Cranial Nerves: Cranial nerves 2-12 are intact.  Psychiatric:        Attention and Perception: Attention normal.        Mood and Affect: Mood normal.        Speech: Speech normal.        Behavior: Behavior normal. Behavior is cooperative.     Labs on Admission: I have personally reviewed following labs and imaging studies Results for orders placed or performed during the hospital encounter of 06/28/23 (from the past 24 hours)  Brain natriuretic peptide     Status: Abnormal   Collection Time: 06/28/23  6:52 PM  Result Value Ref Range   B Natriuretic Peptide 356.0 (H) 0.0 - 100.0 pg/mL  Basic metabolic panel     Status: Abnormal   Collection Time: 06/28/23  6:53 PM  Result Value Ref Range   Sodium 138 135 - 145 mmol/L   Potassium 5.3 (H) 3.5 - 5.1 mmol/L   Chloride 103 98 - 111 mmol/L   CO2 27 22 - 32 mmol/L   Glucose, Bld 90 70 - 99 mg/dL   BUN 28 (H) 8 - 23 mg/dL   Creatinine, Ser 8.47 (H) 0.44 - 1.00 mg/dL   Calcium  9.1 8.9 - 10.3 mg/dL   GFR, Estimated 38 (L) >60 mL/min   Anion gap 8 5 - 15  CBC     Status: Abnormal   Collection Time: 06/28/23  6:53 PM  Result Value Ref Range   WBC 18.8 (H) 4.0 - 10.5 K/uL   RBC 4.13 3.87 - 5.11 MIL/uL   Hemoglobin 10.2 (L) 12.0 - 15.0 g/dL   HCT 66.8 (L) 63.9 - 53.9 %   MCV 80.1 80.0 - 100.0 fL   MCH 24.7 (L) 26.0 - 34.0 pg   MCHC 30.8 30.0 - 36.0 g/dL   RDW 81.7 (H) 88.4 - 84.4 %   Platelets 650 (H) 150 - 400 K/uL   nRBC 0.1 0.0 - 0.2 %  Troponin I (High Sensitivity)     Status: Abnormal   Collection Time: 06/28/23  6:53 PM  Result Value Ref Range   Troponin I (High Sensitivity) 51 (H) <18  ng/L  Troponin I (High Sensitivity)     Status: Abnormal   Collection Time: 06/28/23  8:28 PM  Result Value Ref Range   Troponin I (High Sensitivity) 51 (H) <18 ng/L  Resp panel by RT-PCR (RSV, Flu A&B, Covid) Anterior Nasal Swab     Status: None   Collection Time: 06/28/23 10:56 PM   Specimen: Anterior Nasal Swab  Result Value Ref Range   SARS Coronavirus 2 by RT PCR NEGATIVE NEGATIVE   Influenza A by PCR NEGATIVE NEGATIVE   Influenza B by PCR NEGATIVE NEGATIVE   Resp Syncytial Virus by PCR NEGATIVE NEGATIVE  CBC     Status: Abnormal   Collection Time: 06/29/23  3:49 PM  Result Value Ref Range  WBC 14.5 (H) 4.0 - 10.5 K/uL   RBC 4.42 3.87 - 5.11 MIL/uL   Hemoglobin 11.0 (L) 12.0 - 15.0 g/dL   HCT 64.7 (L) 63.9 - 53.9 %   MCV 79.6 (L) 80.0 - 100.0 fL   MCH 24.9 (L) 26.0 - 34.0 pg   MCHC 31.3 30.0 - 36.0 g/dL   RDW 81.7 (H) 88.4 - 84.4 %   Platelets 646 (H) 150 - 400 K/uL   nRBC 0.1 0.0 - 0.2 %  Basic metabolic panel     Status: Abnormal   Collection Time: 06/29/23  3:49 PM  Result Value Ref Range   Sodium 139 135 - 145 mmol/L   Potassium 4.4 3.5 - 5.1 mmol/L   Chloride 102 98 - 111 mmol/L   CO2 25 22 - 32 mmol/L   Glucose, Bld 104 (H) 70 - 99 mg/dL   BUN 21 8 - 23 mg/dL   Creatinine, Ser 8.58 (H) 0.44 - 1.00 mg/dL   Calcium  9.1 8.9 - 10.3 mg/dL   GFR, Estimated 41 (L) >60 mL/min   Anion gap 12 5 - 15  Procalcitonin     Status: None   Collection Time: 06/29/23  4:21 PM  Result Value Ref Range   Procalcitonin <0.10 ng/mL  Glucose, capillary     Status: None   Collection Time: 06/29/23  4:46 PM  Result Value Ref Range   Glucose-Capillary 92 70 - 99 mg/dL   Recent Results (from the past 720 hours)  Culture, blood (Routine X 2) w Reflex to ID Panel     Status: None   Collection Time: 06/17/23 12:45 PM   Specimen: BLOOD  Result Value Ref Range Status   Specimen Description BLOOD SITE NOT SPECIFIED  Final   Special Requests   Final    BOTTLES DRAWN AEROBIC AND  ANAEROBIC Blood Culture results may not be optimal due to an inadequate volume of blood received in culture bottles   Culture   Final    NO GROWTH 5 DAYS Performed at [facility], [address], [place], [place] [address]    Report Status 06/22/2023 FINAL  Final  Culture, blood (Routine X 2) w Reflex to ID Panel     Status: None   Collection Time: 06/17/23 12:47 PM   Specimen: BLOOD  Result Value Ref Range Status   Specimen Description BLOOD SITE NOT SPECIFIED  Final   Special Requests   Final    BOTTLES DRAWN AEROBIC AND ANAEROBIC Blood Culture results may not be optimal due to an inadequate volume of blood received in culture bottles   Culture   Final    NO GROWTH 5 DAYS Performed at [facility], [address], [place], [place] [address]    Report Status 06/22/2023 FINAL  Final  Expectorated Sputum Assessment w Gram Stain, Rflx to Resp Cult     Status: None   Collection Time: 06/17/23  1:20 PM   Specimen: Sputum  Result Value Ref Range Status   Specimen Description SPU  Final   Special Requests NONE  Final   Sputum evaluation   Final    THIS SPECIMEN IS ACCEPTABLE FOR SPUTUM CULTURE Performed at [facility], [address], [place], [place] [address]    Report Status 06/18/2023 FINAL  Final  Culture, Respiratory w Gram Stain     Status: None   Collection Time: 06/17/23  1:20 PM   Specimen: Sputum  Result Value Ref Range Status  Specimen Description SPU  Final   Special Requests NONE Reflexed from [id number]  Final   Gram Stain   Final    RARE WBC PRESENT, PREDOMINANTLY PMN FEW GRAM POSITIVE COCCI FEW GRAM NEGATIVE RODS RARE BUDDING YEAST SEEN Performed at [facility], [address], [place], [place] [address]    Culture   Final    ABUNDANT ENTEROCOCCUS FAECALIS RARE ENTEROBACTER CLOACAE    Report Status 06/22/2023 FINAL  Final   Organism ID, Bacteria ENTEROCOCCUS FAECALIS  Final   Organism ID, Bacteria ENTEROBACTER CLOACAE  Final       Susceptibility   Enterobacter cloacae - MIC*    CEFEPIME  <=0.12 SENSITIVE Sensitive     CEFTAZIDIME <=1 SENSITIVE Sensitive     CIPROFLOXACIN <=0.25 SENSITIVE Sensitive     GENTAMICIN <=1 SENSITIVE Sensitive     IMIPENEM 0.5 SENSITIVE Sensitive     TRIMETH /SULFA  >=320 RESISTANT Resistant     PIP/TAZO 8 SENSITIVE Sensitive ug/mL    * RARE ENTEROBACTER CLOACAE   Enterococcus faecalis - MIC*    AMPICILLIN <=2 SENSITIVE Sensitive     VANCOMYCIN  1 SENSITIVE Sensitive     GENTAMICIN SYNERGY SENSITIVE Sensitive     * ABUNDANT ENTEROCOCCUS FAECALIS  Resp panel by RT-PCR (RSV, Flu A&B, Covid) Anterior Nasal Swab     Status: None   Collection Time: 06/28/23 10:56 PM   Specimen: Anterior Nasal Swab  Result Value Ref Range Status   SARS Coronavirus 2 by RT PCR NEGATIVE NEGATIVE Final    Comment: (NOTE) SARS-CoV-2 target nucleic acids are NOT DETECTED.  The SARS-CoV-2 RNA is generally detectable in upper respiratory specimens during the acute phase of infection. The lowest concentration of SARS-CoV-2 viral copies this assay can detect is 138 copies/mL. A negative result does not preclude SARS-Cov-2 infection and should not be used as the sole basis for treatment or other patient management decisions. A negative result may occur with  improper specimen collection/handling, submission of specimen other than nasopharyngeal swab, presence of viral mutation(s) within the areas targeted by this assay, and inadequate number of viral copies(<138 copies/mL). A negative result must be combined with clinical observations, patient history, and epidemiological information. The expected result is Negative.  Fact Sheet for Patients:  bloggercourse.com  Fact Sheet for Healthcare Providers:  seriousbroker.it  This test is no t yet approved or cleared by the [place]  FDA and  has been authorized for detection and/or diagnosis of SARS-CoV-2 by FDA  under an Emergency Use Authorization (EUA). This EUA will remain  in effect (meaning this test can be used) for the duration of the COVID-19 declaration under Section 564(b)(1) of the Act, 21 U.S.C.section 360bbb-3(b)(1), unless the authorization is terminated  or revoked sooner.       Influenza A by PCR NEGATIVE NEGATIVE Final   Influenza B by PCR NEGATIVE NEGATIVE Final    Comment: (NOTE) The Xpert Xpress SARS-CoV-2/FLU/RSV plus assay is intended as an aid in the diagnosis of influenza from Nasopharyngeal swab specimens and should not be used as a sole basis for treatment. Nasal washings and aspirates are unacceptable for Xpert Xpress SARS-CoV-2/FLU/RSV testing.  Fact Sheet for Patients: bloggercourse.com  Fact Sheet for Healthcare Providers: seriousbroker.it  This test is not yet approved or cleared by the [place]  FDA and has been authorized for detection and/or diagnosis of SARS-CoV-2 by FDA under an Emergency Use Authorization (EUA). This EUA will remain in effect (meaning this test can be used) for the duration of the COVID-19  declaration under Section 564(b)(1) of the Act, 21 U.S.C. section 360bbb-3(b)(1), unless the authorization is terminated or revoked.     Resp Syncytial Virus by PCR NEGATIVE NEGATIVE Final    Comment: (NOTE) Fact Sheet for Patients: bloggercourse.com  Fact Sheet for Healthcare Providers: seriousbroker.it  This test is not yet approved or cleared by the [place]  FDA and has been authorized for detection and/or diagnosis of SARS-CoV-2 by FDA under an Emergency Use Authorization (EUA). This EUA will remain in effect (meaning this test can be used) for the duration of the COVID-19 declaration under Section 564(b)(1) of the Act, 21 U.S.C. section 360bbb-3(b)(1), unless the authorization is terminated or revoked.  Performed at [facility], [address] [place], [place] [address]    CBC:    Latest Ref Rng & Units 06/29/2023    3:49 PM 06/28/2023    6:53 PM 06/24/2023    3:16 AM  CBC  WBC 4.0 - 10.5 K/uL 14.5  18.8  18.7   Hemoglobin 12.0 - 15.0 g/dL 88.9  89.7  9.4   Hematocrit 36.0 - 46.0 % 35.2  33.1  30.4   Platelets 150 - 400 K/uL 646  650  414    Basic Metabolic Panel: Recent Labs  Lab 06/23/23 0257 06/24/23 0316 06/28/23 1853 06/29/23 1549  NA 139 139 138 139  K 4.2 4.0 5.3* 4.4  CL 100 101 103 102  CO2 29 28 27 25   GLUCOSE 123* 121* 90 104*  BUN 27* 27* 28* 21  CREATININE 1.09* 1.06* 1.52* 1.41*  CALCIUM  9.1 9.1 9.1 9.1  MG 2.1 2.0  --   --    Creatinine: Lab Results  Component Value Date   CREATININE 1.41 (H) 06/29/2023   CREATININE 1.52 (H) 06/28/2023   CREATININE 1.06 (H) 06/24/2023   Liver Function Tests:    Latest Ref Rng & Units 06/14/2023    6:26 AM 05/24/2023   11:26 AM 04/28/2023    2:46 AM  Hepatic Function  Total Protein 6.5 - 8.1 g/dL 7.0  7.9  7.4   Albumin  3.5 - 5.0 g/dL 3.8  4.1  4.1   AST 15 - 41 U/L 22  19  17    ALT 0 - 44 U/L 21  19  18    Alk Phosphatase 38 - 126 U/L 69  71  67   Total Bilirubin 0.0 - 1.2 mg/dL 0.4  0.4  0.5    Coagulation Profile: No results for input(s): INR, PROTIME in the last 168 hours. Cardiac Enzymes: No results for input(s): CKTOTAL, CKMB, CKMBINDEX, TROPONINI in the last 168 hours. BNP (last 3 results) Recent Labs    04/27/23 1113  PROBNP 315*   HbA1C: No results for input(s): HGBA1C in the last 72 hours. Lipid Profile: No results for input(s): CHOL, HDL, LDLCALC, TRIG, CHOLHDL, LDLDIRECT in the last 72 hours.  Radiological Exams on Admission: DG Chest 2 View Result Date: 06/28/2023 CLINICAL DATA:  sob EXAM: CHEST - 2 VIEW COMPARISON:  Cxr 06/20/23 FINDINGS: Limited evaluation due to overlapping osseous structures and overlying soft tissues. The heart and mediastinal contours are  unchanged. Aortic valve replacement. Left lower lung zone airspace opacity. No pulmonary edema. Small left pleural effusion. No pneumothorax. No acute osseous abnormality. Sternotomy wires intact. IMPRESSION: Limited evaluation due to overlapping osseous structures and overlying soft tissues. Suggestion of left lower lung zone airspace opacity and small left pleural effusion. Followup PA and lateral chest X-ray is recommended in 3-4 weeks following  therapy to ensure resolution. Electronically Signed   By: Morgane  Naveau M.D.   On: 06/28/2023 19:46    Data Reviewed: Relevant notes from primary care and specialist visits, past discharge summaries as available in EHR, including Care Everywhere. Prior diagnostic testing as pertinent to current admission diagnoses, Updated medications and problem lists for reconciliation ED course, including vitals, labs, imaging, treatment and response to treatment,Triage notes, nursing and pharmacy notes and ED provider's notes Notable results as noted in HPI.Discussed case with EDMD/ ED APP/ or Specialty MD on call and as needed.  Assessment & Plan Dyspnea 2/2 to volume overload and hypertensive urgency.  Lasix  20 mg at bedtime if bp allows.  Acute on chronic heart failure with preserved ejection fraction (HFpEF) (HCC) Greatly appreciate cardiology consult and management, strict  I's and O's, supplemental oxygen. S/P AVR (aortic valve replacement) and aortoplasty CTR surgery consult as deemed appropriate per a.m. team and/or cardiology services.  Acute kidney injury superimposed on chronic kidney disease (HCC) Lab Results  Component Value Date   CREATININE 1.41 (H) 06/29/2023   CREATININE 1.52 (H) 06/28/2023   CREATININE 1.06 (H) 06/24/2023  Currently hold losartan  and follow, avoid contrast studies, avoid NSAIDs.  Essential hypertension Vitals:   06/29/23 0530 06/29/23 0547 06/29/23 0600 06/29/23 0700  BP: (!) 172/86 (!) 172/86 136/88 (!) 160/87    06/29/23 0800 06/29/23 0900 06/29/23 0930 06/29/23 1000  BP: 132/84 (!) 144/84 129/82 (!) 150/85   06/29/23 1100 06/29/23 1200 06/29/23 1327 06/29/23 1645  BP: (!) 158/81 104/64 (!) 155/73 (!) 144/86  Home regimen includes Coreg  12.5 twice daily, metoprolol , losartan , amlodipine ,  Med rec is pending.  Will start patient on metoprolol  , continue amlodipine .  Hold losartan . Gastroesophageal reflux disease without esophagitis IV PPI therapy, DM type 2 (diabetes mellitus, type 2) (HCC) Glycemic protocol,metformin  held.  Iron  deficiency anemia    Latest Ref Rng & Units 06/29/2023    3:49 PM 06/28/2023    6:53 PM 06/24/2023    3:16 AM  CBC  WBC 4.0 - 10.5 K/uL 14.5  18.8  18.7   Hemoglobin 12.0 - 15.0 g/dL 88.9  89.7  9.4   Hematocrit 36.0 - 46.0 % 35.2  33.1  30.4   Platelets 150 - 400 K/uL 646  650  414   Currently H&H is stable we will follow. Chart review shows patient's had anemia since 2013.  Currently hemoglobin is stable will defer to PCP for hematology and GI consult if appropriate.  DVT prophylaxis:  Eliquis   Consults:  Cardiology  Advance Care Planning:    Code Status: Full Code   Family Communication:  None  Disposition Plan:  Home  Severity of Illness: The appropriate patient status for this patient is INPATIENT. Inpatient status is judged to be reasonable and necessary in order to provide the required intensity of service to ensure the patient's safety. The patient's presenting symptoms, physical exam findings, and initial radiographic and laboratory data in the context of their chronic comorbidities is felt to place them at high risk for further clinical deterioration. Furthermore, it is not anticipated that the patient will be medically stable for discharge from the hospital within 2 midnights of admission.   * I certify that at the point of admission it is my clinical judgment that the patient will require inpatient hospital care spanning beyond 2 midnights from the point  of admission due to high intensity of service, high risk for further deterioration and high frequency of surveillance required.*  Author:  Mario LULLA Blanch, MD 06/29/2023 6:18 PM  For on call review [email or web address].   Unresulted Labs (From admission, onward)     Start     Ordered   06/29/23 1730  C-reactive protein  Once,   AD        06/29/23 1730   06/29/23 1622  Sedimentation rate  Add-on,   AD        06/29/23 1621   06/29/23 1528  Culture, blood (Routine X 2) w Reflex to ID Panel  BLOOD CULTURE X 2,   R      06/29/23 1527   06/29/23 1528  Urinalysis, Routine w reflex microscopic -Urine, Random  Once,   R       Question:  Specimen Source  Answer:  Urine, Random   06/29/23 1527            Orders Placed This Encounter  Procedures   ED Echo US  Bedside   Resp panel by RT-PCR (RSV, Flu A&B, Covid) Anterior Nasal Swab   Culture, blood (Routine X 2) w Reflex to ID Panel   DG Chest 2 View   Basic metabolic panel   CBC   Brain natriuretic peptide   Urinalysis, Routine w reflex microscopic -Urine, Random   CBC   Basic metabolic panel   Procalcitonin   Sedimentation rate   Glucose, capillary   C-reactive protein   Diet heart healthy/carb modified Room service appropriate? Yes; Fluid consistency: Thin; Fluid restriction: 1500 mL Fluid   Document Height and Actual Weight   Cardiac Monitoring - Continuous Indefinite   Apply Diabetes Mellitus Care Plan   STAT CBG when hypoglycemia is suspected. If treated, recheck every 15 minutes after each treatment until CBG >/= 70 mg/dl   Refer to Hypoglycemia Protocol Sidebar Report for treatment of CBG < 70 mg/dl   No HS correction Insulin    Notify physician (specify)   Initiate Heart Failure Care Plan   Daily weights   Strict intake and output   In and Out Cath   Patient Education:   Apply Heart Failure Care Plan   Apply Diabetes Mellitus Care Plan   STAT CBG when hypoglycemia is suspected. If treated, recheck every 15 minutes after each  treatment until CBG >/= 70 mg/dl   Refer to Hypoglycemia Protocol Sidebar Report for treatment of CBG < 70 mg/dl   Patient has an active order for admit to inpatient/place in observation   Maintain IV access   Vital signs   Notify physician (specify)   Mobility Protocol: No Restrictions RN to initiate protocols based on patient's level of care   Refer to Sidebar Report Refer to ICU, Med-Surg, Progressive, and Step-Down Mobility Protocol Sidebars   Initiate Adult Central Line Maintenance and Catheter Protocol for patients with central line (CVC, PICC, Port, Hemodialysis, Trialysis)   If patient diabetic or glucose greater than 140 notify physician for Sliding Scale Insulin  Orders   Do not place and if present remove PureWick   Initiate Oral Care Protocol   Initiate Carrier Fluid Protocol   RN may order General Admission PRN Orders utilizing General Admission PRN medications (through manage orders) for the following patient needs: allergy symptoms (Claritin ), cold sores (Carmex), cough (Robitussin DM), eye irritation (Liquifilm Tears), hemorrhoids (Tucks), indigestion (Maalox), minor skin irritation (Hydrocortisone Cream), muscle pain Lucienne Gay), nose irritation (saline nasal spray) and sore throat (Chloraseptic spray).   Full code   Inpatient consult to Cardiology   Consult to hospitalist   Consult to cardiothoracic Surgery  monitoring by pharmacy   Inpatient consult to Cardiology Consult Timeframe: ROUTINE - requires response within 24 hours; Reason for Consult? CHF Already called   Nutritional services consult   OT eval and treat   PT eval and treat   Pulse oximetry check with vital signs   Oxygen therapy Mode or (Route): Nasal cannula; Liters Per Minute: 2; Keep O2 saturation between: greater than 92 %   Incentive spirometry   ED EKG   EKG   EKG   Insert peripheral IV   Admit to Inpatient (patient's expected length of stay will be greater than 2 midnights or inpatient only  procedure)

## 2023-06-29 NOTE — Assessment & Plan Note (Signed)
IV PPI therapy,

## 2023-06-29 NOTE — Progress Notes (Signed)
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 The patient's early postoperative recovery while in the hospital was notable for + blood cultures for Enterococcus Faecalis for which she was started Augmentin, she had Atrial Flutter and was started on Apixiban, and home health arrangements were made.  Since hospital discharge the patient the reported to the ED on 06/28/2023 with complaints of dyspnea.  She was noted to have leukocytosis of 18K, creatinine was up to 1.52. She was admitted to the hospital for diuretics.  Her respiratory panel was negative. Overall she is feeling better since leaving hospital discharge.  She states her weight has remained stable since that time.  She is weighing daily and now using lasix prn.  She is ambulating without significant difficulty.  Her surgical incisions are healing without issue.  Her taste buds have recovered.  Her bowels habits have returned to normal.  She is currently undergoing home PT, she may be interested in cardiac rehab once this is completed.  No current facility-administered medications for this visit.   Current Outpatient Medications  Medication Sig Dispense Refill   acetaminophen (TYLENOL) 500 MG tablet Take 1,000 mg by mouth every 6 (six) hours as needed for mild pain (pain score 1-3) or headache.     albuterol (PROAIR HFA) 108 (90 BASE) MCG/ACT inhaler Inhale 2 puffs into the lungs every 6 (six) hours as needed. 1 Inhaler 12   amiodarone (PACERONE) 200 MG tablet Take 1 tablet (200 mg total) by mouth 2 (two) times daily. 60 tablet 1   amLODipine (NORVASC) 10 MG tablet Take 1 tablet (10 mg total) by mouth daily. (Patient taking differently: Take 10 mg by mouth at bedtime.) 90 tablet 1   amoxicillin-clavulanate (AUGMENTIN) 875-125 MG tablet Take 1 tablet by mouth every  12 (twelve) hours. 10 tablet 0   apixaban (ELIQUIS) 5 MG TABS tablet Take 1 tablet (5 mg total) by mouth 2 (two) times daily. 60 tablet 1   ascorbic acid (VITAMIN C) 500 MG tablet Take 500 mg by mouth daily.     aspirin 81 MG chewable tablet Chew 1 tablet (81 mg total) by mouth daily.     carvedilol (COREG) 12.5 MG tablet Take 12.5 mg by mouth 2 (two) times daily.     cetirizine (ZYRTEC) 10 MG tablet Take 1 tablet (10 mg total) by mouth daily. 30 tablet 0   docusate sodium (COLACE) 100 MG capsule Take 200-300 mg by mouth at bedtime.     ezetimibe (ZETIA) 10 MG tablet Take 1 tablet (10 mg total) by mouth daily. 30 tablet 1   fluticasone (FLONASE) 50 MCG/ACT nasal spray Place 2 sprays into both nostrils daily. (Patient taking differently: Place 2 sprays into both nostrils daily as needed for rhinitis.) 16 g 0   gabapentin (NEURONTIN) 600 MG tablet Take 600 mg by mouth 3 (three) times daily.     iron polysaccharides (NIFEREX) 150 MG capsule TAKE 1 CAPSULE (150 MG DOSE) BY MOUTH TWICE A DAY (Patient taking differently: Take 150 mg by mouth daily.) 180 capsule 1   losartan (COZAAR) 100 MG tablet Take 1 tablet (100 mg total) by mouth daily. 30 tablet 1   metFORMIN (GLUCOPHAGE) 1000 MG tablet Take 1 tablet (1,000 mg total) by mouth  2 (two) times daily with a meal. (Patient not taking: Reported on 06/29/2023) 180 tablet 1   metoprolol tartrate (LOPRESSOR) 25 MG tablet Take 0.5 tablets (12.5 mg total) by mouth 2 (two) times daily. 30 tablet 1   ondansetron (ZOFRAN) 8 MG tablet Take 8 mg by mouth 3 (three) times daily.     oxyCODONE (OXY IR/ROXICODONE) 5 MG immediate release tablet Take 1 tablet (5 mg total) by mouth every 6 (six) hours as needed for up to 7 days for severe pain (pain score 7-10). 28 tablet 0   pantoprazole (PROTONIX) 40 MG tablet Take 40 mg by mouth daily.     polyvinyl alcohol (LIQUIFILM TEARS) 1.4 % ophthalmic solution Place 1 drop into both eyes as needed for dry eyes.     predniSONE  (DELTASONE) 20 MG tablet Take 40 mg by mouth daily.     rosuvastatin (CRESTOR) 10 MG tablet Take 1 tablet (10 mg total) by mouth every other day. 45 tablet 3   Semaglutide, 2 MG/DOSE, (OZEMPIC, 2 MG/DOSE,) 8 MG/3ML SOPN Inject 2 mg into the skin once a week.     sulfamethoxazole-trimethoprim (BACTRIM) 400-80 MG tablet TAKE 1 TABLET BY MOUTH TWICE DAILY 60 tablet 1   Facility-Administered Medications Ordered in Other Visits  Medication Dose Route Frequency Provider Last Rate Last Admin   acetaminophen (TYLENOL) tablet 1,000 mg  1,000 mg Oral Q6H PRN Blane Ohara, MD   1,000 mg at 06/29/23 1055   albuterol (VENTOLIN HFA) 108 (90 Base) MCG/ACT inhaler 2 puff  2 puff Inhalation Q4H PRN Blane Ohara, MD        Physical Exam:  BP (!) 150/72 (BP Location: Right Arm, Patient Position: Sitting, Cuff Size: Normal)   Pulse 94   Resp 20   Wt 197 lb (89.4 kg)   SpO2 95% Comment: RA  BMI 42.63 kg/m   Gen: NAD Heart: RRR Lungs: CTA bilaterally Ext: no edema Incisions: Well healed  Diagnostic Tests:  CXR: no acute issues, trace posterior pleural effusions, sternal  A/P:  S/P Bent[place] procedure- readmitted to the hospital for acute CHF, overall breathing much better, now using Lasix prn A. Fib/flutter- appears to be NSR Activity-increase as tolerated, sternal precautions as discussed, okay to resume driving next week Cardiac Rehab- currently patient is having home PT.Marland Kitchen okay to start cardiac rehab after next week if she is interested RTC in weeks with Dr. Leafy Ro.. patient instructed to contact office if she develops shortness of breath again to repeat CXR  Lowella Dandy, PA-C [facility] [phone]

## 2023-06-29 NOTE — Assessment & Plan Note (Addendum)
 Lab Results  Component Value Date   CREATININE 1.41 (H) 06/29/2023   CREATININE 1.52 (H) 06/28/2023   CREATININE 1.06 (H) 06/24/2023  Currently hold losartan  and follow, avoid contrast studies, avoid NSAIDs.

## 2023-06-29 NOTE — Assessment & Plan Note (Signed)
 Greatly appreciate cardiology consult and management, strict  I's and O's, supplemental oxygen.

## 2023-06-29 NOTE — ED Notes (Signed)
 Called Thomas at [facility] for transport 12:13

## 2023-06-29 NOTE — Assessment & Plan Note (Signed)
 CTR surgery consult as deemed appropriate per a.m. team and/or cardiology services.

## 2023-06-29 NOTE — Discharge Planning (Signed)
 RNCM received phone call from pt daughter, Adacia regarding updates on pt status.  RNCM advised that pt is at another facility with admission orders awaiting a bed.

## 2023-06-29 NOTE — Assessment & Plan Note (Addendum)
    Latest Ref Rng & Units 06/29/2023    3:49 PM 06/28/2023    6:53 PM 06/24/2023    3:16 AM  CBC  WBC 4.0 - 10.5 K/uL 14.5  18.8  18.7   Hemoglobin 12.0 - 15.0 g/dL 88.9  89.7  9.4   Hematocrit 36.0 - 46.0 % 35.2  33.1  30.4   Platelets 150 - 400 K/uL 646  650  414   Currently H&H is stable we will follow. Chart review shows patient's had anemia since 2013.  Currently hemoglobin is stable will defer to PCP for hematology and GI consult if appropriate.

## 2023-06-29 NOTE — Assessment & Plan Note (Addendum)
Glycemic protocol,metformin held.

## 2023-06-29 NOTE — Assessment & Plan Note (Addendum)
 Vitals:   06/29/23 0530 06/29/23 0547 06/29/23 0600 06/29/23 0700  BP: (!) 172/86 (!) 172/86 136/88 (!) 160/87   06/29/23 0800 06/29/23 0900 06/29/23 0930 06/29/23 1000  BP: 132/84 (!) 144/84 129/82 (!) 150/85   06/29/23 1100 06/29/23 1200 06/29/23 1327 06/29/23 1645  BP: (!) 158/81 104/64 (!) 155/73 (!) 144/86  Home regimen includes Coreg  12.5 twice daily, metoprolol , losartan , amlodipine ,  Med rec is pending.  Will start patient on metoprolol  , continue amlodipine .  Hold losartan .

## 2023-06-29 NOTE — Consult Note (Addendum)
 Cardiology Consultation   Patient ID: LAURI PURDUM MRN: [id number]; DOB: 1955-07-24  Admit date: 06/28/2023 Date of Consult: 06/29/2023  PCP:  [name], PA-C   [facility] Providers Cardiologist:  Maude Emmer, MD      Patient Profile:   Olivia  JACQUELEEN [name] is a 68 y.o. 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 Tobie.  History of Present Illness:   Ms. Srinivasan has had multiple back surgeries with hardware infection on chronic suppressive ABX. She had a progression of her aortic stenosis and underwent evaluation for SAVR vs TAVR, given her poor mobility. Heart catheterization as part of this work up showed no significant CAD. Unfortunately due to valve size, she was not felt to be a candidate for TAVR and underwent SAVR (Bentall procedure) on 06/14/23. EP was consulted for CHB. Conduction improved and PPM was not implanted. Unfortunately, she developed febrile illness and blood cultures grew enterococcus faecalis. Shew as treated with augmentin . She had post up atrial flutter resulting in IV amiodarone  treatment and anticoagulation with eliquis . She was discharged on 06/24/23 with 200 mg amiodarone  PO taper, amlodipine , ASA, eliquis  5 mg BID, lopressor  12.5 mg BID, 10 mg crestor  and zetia . She was not discharged with diuretic regimen.   She presented back to [facility] ER 06/28/23 with shortness of breath found to have AKI with hyperkalemia, mild tachypnea, and hypertensive urgency at 175/96. Bedside US  was not consistent with tamponade. Given recent surgery, she was given IV lasix  20 mg and transferred to [facility] for further workup. She reports compliance with eliquis .   She states that Saturday, she experienced  right foot pain and started taking prednisone  for presumed gout flare. Saturday night she reports onset of shortness of breath while sitting up in a chair. She was able to take her pain medication and sleep that night, although her husband reports heavy breathing at night since discharge. Shortness of breath progressed with minimal exertion and became more intense while at rest. She denies lower extremity swelling, orthopnea, and PND. No chest pain, other than expected soreness from sternotomy. She denies urinary symptoms.  She reports feeling better after 20 mg IV lasix .  Past Medical History:  Diagnosis Date   Allergy    Anemia    Anxiety    Arthritis    Asthma    BMI 40.0-44.9, adult (HCC) 04/07/2014   DM type 2 (diabetes mellitus, type 2) (HCC)    GERD (gastroesophageal reflux disease)    HTN (hypertension)    Hypercholesterolemia    Osteoarthritis of left hip 04/07/2014   Reflux    Severe aortic stenosis    Spinal headache    with C-Section and with spinal fusion in 2019    Past Surgical History:  Procedure Laterality Date   ASCENDING AORTIC ROOT REPLACEMENT N/A 06/14/2023   Procedure: ASCENDING AORTIC ROOT REPLACEMENT USING KONECT RESILIA AORTIC VALVE CONDUIT SIZE AND REATTACHMENT OF RIGHT AND LEFT CORONARY;  Surgeon: Maryjane Mt, MD;  Location: [facility] OR;  Service: Open Heart Surgery;  Laterality: N/A;   BACK SURGERY  2023   lumbar fusion, got infection, then had to re-do fusion   CESAREAN SECTION     x3   COLONOSCOPY     LAMINECTOMY  03/2021  LEFT HEART CATH AND CORONARY ANGIOGRAPHY N/A 04/06/2023   Procedure: LEFT HEART CATH AND CORONARY ANGIOGRAPHY;  Surgeon: Wonda Sharper, MD;  Location: [facility] INVASIVE CV LAB;  Service: Cardiovascular;  Laterality: N/A;   SPINAL FUSION  2019   TEE WITHOUT CARDIOVERSION N/A 06/14/2023   Procedure: TRANSESOPHAGEAL ECHOCARDIOGRAM (TEE);  Surgeon: Maryjane Mt, MD;  Location: [facility] OR;  Service: Open Heart Surgery;  Laterality: N/A;    TUBAL LIGATION       Home Medications:  Prior to Admission medications   Medication Sig Start Date End Date Taking? Authorizing Provider  acetaminophen  (TYLENOL ) 500 MG tablet Take 1,000 mg by mouth every 6 (six) hours as needed for mild pain (pain score 1-3) or headache.   Yes [provider]  albuterol  (PROAIR  HFA) 108 (90 BASE) MCG/ACT inhaler Inhale 2 puffs into the lungs every 6 (six) hours as needed. 07/21/13  Yes Lenon Juliane RAMAN, MD  amiodarone  (PACERONE ) 200 MG tablet Take 1 tablet (200 mg total) by mouth 2 (two) times daily. 06/24/23  Yes Gold, Wayne E, PA-C  amLODipine  (NORVASC ) 10 MG tablet Take 1 tablet (10 mg total) by mouth daily. Patient taking differently: Take 10 mg by mouth at bedtime. 11/23/17  Yes Christopher Savannah, PA-C  amoxicillin -clavulanate (AUGMENTIN ) 875-125 MG tablet Take 1 tablet by mouth every 12 (twelve) hours. 06/24/23  Yes Gold, Wayne E, PA-C  apixaban  (ELIQUIS ) 5 MG TABS tablet Take 1 tablet (5 mg total) by mouth 2 (two) times daily. 06/23/23  Yes Maryjane Mt, MD  ascorbic acid (VITAMIN C) 500 MG tablet Take 500 mg by mouth daily.   Yes [provider]  aspirin  81 MG chewable tablet Chew 1 tablet (81 mg total) by mouth daily. 06/24/23  Yes Gold, Wayne E, PA-C  cetirizine  (ZYRTEC ) 10 MG tablet Take 1 tablet (10 mg total) by mouth daily. 10/09/22  Yes Allwardt, Alyssa M, PA-C  docusate sodium  (COLACE) 100 MG capsule Take 200-300 mg by mouth at bedtime.   Yes [provider]  ezetimibe  (ZETIA ) 10 MG tablet Take 1 tablet (10 mg total) by mouth daily. 06/24/23  Yes Gold, Wayne E, PA-C  fluticasone  (FLONASE ) 50 MCG/ACT nasal spray Place 2 sprays into both nostrils daily. Patient taking differently: Place 2 sprays into both nostrils daily as needed for rhinitis. 10/09/22  Yes Allwardt, Alyssa M, PA-C  gabapentin  (NEURONTIN ) 600 MG tablet Take 600 mg by mouth 3 (three) times daily. 12/01/21  Yes [provider]  iron  polysaccharides (NIFEREX)  150 MG capsule TAKE 1 CAPSULE (150 MG DOSE) BY MOUTH TWICE A DAY Patient taking differently: Take 150 mg by mouth daily. 12/19/19 06/29/23 Yes Arby Lyle LABOR, NP  losartan  (COZAAR ) 100 MG tablet Take 1 tablet (100 mg total) by mouth daily. 06/24/23  Yes Gold, Wayne E, PA-C  metoprolol  tartrate (LOPRESSOR ) 25 MG tablet Take 0.5 tablets (12.5 mg total) by mouth 2 (two) times daily. 06/24/23  Yes Gold, Wayne E, PA-C  ondansetron  (ZOFRAN ) 8 MG tablet Take 8 mg by mouth 3 (three) times daily. 06/02/23  Yes [provider]  oxyCODONE  (OXY IR/ROXICODONE ) 5 MG immediate release tablet Take 1 tablet (5 mg total) by mouth every 6 (six) hours as needed for up to 7 days for severe pain (pain score 7-10). 06/24/23 07/01/23 Yes Gold, Wayne E, PA-C  pantoprazole  (PROTONIX ) 40 MG tablet Take 40 mg by mouth daily. 12/28/21  Yes [provider]  polyvinyl alcohol (LIQUIFILM TEARS) 1.4 % ophthalmic solution Place 1 drop into both  eyes as needed for dry eyes.   Yes [provider]  predniSONE  (DELTASONE ) 20 MG tablet Take 40 mg by mouth daily. 06/25/23 06/30/23 Yes [provider]  rosuvastatin  (CRESTOR ) 10 MG tablet Take 1 tablet (10 mg total) by mouth every other day. 02/03/23  Yes Conte, Tessa N, PA-C  Semaglutide , 2 MG/DOSE, (OZEMPIC , 2 MG/DOSE,) 8 MG/3ML SOPN Inject 2 mg into the skin once a week.   Yes [provider]  sulfamethoxazole -trimethoprim  (BACTRIM ) 400-80 MG tablet TAKE 1 TABLET BY MOUTH TWICE DAILY 05/26/23  Yes Vu, Constance T, MD  carvedilol  (COREG ) 12.5 MG tablet Take 12.5 mg by mouth 2 (two) times daily.    [provider]  metFORMIN  (GLUCOPHAGE ) 1000 MG tablet Take 1 tablet (1,000 mg total) by mouth 2 (two) times daily with a meal. Patient not taking: Reported on 06/29/2023 05/01/21       Inpatient Medications: Scheduled Meds:  insulin  aspart  0-9 Units Subcutaneous TID WC   sodium chloride  flush  3 mL Intravenous Q12H   Continuous Infusions:  PRN  Meds: acetaminophen  **OR** acetaminophen , albuterol , hydrALAZINE , ondansetron  **OR** ondansetron  (ZOFRAN ) IV  Allergies:    Allergies  Allergen Reactions   Crestor  [Rosuvastatin ] Other (See Comments)    Myalgia; patient can only tolerate taking 10 mg every other day   Robaxin  [Methocarbamol ] Other (See Comments)    Insomnia   Toradol  [Ketorolac  Tromethamine ] Other (See Comments)   Zocor [Simvastatin] Other (See Comments)    Myalgias    Lipitor [Atorvastatin ] Other (See Comments)    Myalgia   Sulfa  Antibiotics Hives    Social History:   Social History   Socioeconomic History   Marital status: Married    Spouse name: Not on file   Number of children: 3   Years of education: Not on file   Highest education level: Not on file  Occupational History   Not on file  Tobacco Use   Smoking status: Never   Smokeless tobacco: Never  Vaping Use   Vaping status: Never Used  Substance and Sexual Activity   Alcohol use: No    Alcohol/week: 0.0 standard drinks of alcohol   Drug use: No   Sexual activity: Yes  Other Topics Concern   Not on file  Social History Narrative   Not on file   Social Drivers of Health   Financial Resource Strain: Low Risk  (04/13/2023)   Received from [facility]   Overall Financial Resource Strain (CARDIA)    Difficulty of Paying Living Expenses: Not hard at all  Food Insecurity: No Food Insecurity (06/18/2023)   Hunger Vital Sign    Worried About Running Out of Food in the Last Year: Never true    Ran Out of Food in the Last Year: Never true  Transportation Needs: No Transportation Needs (06/18/2023)   PRAPARE - Administrator, Civil Service (Medical): No    Lack of Transportation (Non-Medical): No  Physical Activity: Inactive (04/13/2023)   Received from [facility]   Exercise Vital Sign    Days of Exercise per Week: 0 days    Minutes of Exercise per Session: 30 min  Stress: No Stress Concern Present (04/13/2023)   Received from  [facility] - Occupational Stress Questionnaire    Feeling of Stress : Only a little  Social Connections: Unknown (06/18/2023)   Social Connection and Isolation Panel [NHANES]    Frequency of Communication with Friends and Family: Once  a week    Frequency of Social Gatherings with Friends and Family: Once a week    Attends Religious Services: Patient declined    Database Administrator or Organizations: Patient declined    Attends Banker Meetings: Patient declined    Marital Status: Married  Recent Concern: Social Connections - Somewhat Isolated (04/13/2023)   Received from [employer]   Social Network    How would you rate your social network (family, work, friends)?: Restricted participation with some degree of social isolation  Intimate Partner Violence: Not At Risk (06/18/2023)   Humiliation, Afraid, Rape, and Kick questionnaire    Fear of Current or Ex-Partner: No    Emotionally Abused: No    Physically Abused: No    Sexually Abused: No    Family History:    Family History  Problem Relation Age of Onset   Hypertension Mother    Stroke Mother    Heart disease Father    Stroke Brother    Multiple sclerosis Brother    Multiple sclerosis Sister    Colon cancer Neg Hx    Colon polyps Neg Hx    Esophageal cancer Neg Hx    Rectal cancer Neg Hx    Stomach cancer Neg Hx      ROS:  Please see the history of present illness.   All other ROS reviewed and negative.     Physical Exam/Data:   Vitals:   06/29/23 1100 06/29/23 1200 06/29/23 1236 06/29/23 1327  BP: (!) 158/81 104/64  (!) 155/73  Pulse:  96  96  Resp:  18  20  Temp:    98.7 F (37.1 C)  TempSrc:    Oral  SpO2:  100% 100% 100%    Intake/Output Summary (Last 24 hours) at 06/29/2023 1607 Last data filed at 06/29/2023 9487 Gross per 24 hour  Intake 350 ml  Output 750 ml  Net -400 ml      06/24/2023    6:55 AM 06/23/2023    5:39 AM 06/22/2023    5:00  AM  Last 3 Weights  Weight (lbs) 193 lb 12.6 oz 195 lb 8 oz 196 lb 10.4 oz  Weight (kg) 87.9 kg 88.678 kg 89.2 kg     There is no height or weight on file to calculate BMI.  General:  obese female in NAD HEENT: normal Neck: no JVD Vascular: No carotid bruits; Distal pulses 2+ bilaterally Cardiac:  normal S1, S2; RRR; no murmur  Lungs:  diminished on right base, respirations unlabored  Abd: soft, nontender, no hepatomegaly  Ext: no edema Musculoskeletal:  No deformities, BUE and BLE strength normal and equal Skin: warm and dry  Neuro:  CNs 2-12 intact, no focal abnormalities noted Psych:  Normal affect   EKG:  The EKG was personally reviewed and demonstrates:  sinus rhythm with HR 93, RBBB, LAFB Telemetry:  Telemetry was personally reviewed and demonstrates:  sinus rhythm to sinus tachycardia 90-100s  Relevant CV Studies:  Echo pending  Intra-op TEE 06/14/23: POST-OP IMPRESSIONS  _ Left Ventricle: The left ventricle is unchanged from pre-bypass. has  normal  systolic function. Underfilled cavity.  _ Right Ventricle: moderately reduced function after chest closure;  epinephrine   2mcg/min added for support.  _ Aorta: there is no dissection present in the aorta.  _ Left Atrial Appendage: The left atrial appendage appears unchanged from  pre-bypass.  _ Aortic Valve: Status post bioprosthetic aortic valve replacement.  Normally  functioning prosthetic valve with  no visualized regurgitation. Mean  transvalvular gradient of .  _ Mitral Valve: No stenosis present. There is moderate regurgitation.No  flow  reversal in the pulmonary veins.  _ Tricuspid Valve: The tricuspid valve appears unchanged from pre-bypass.  There  is mild regurgitation.  _ Pulmonic Valve: The pulmonic valve appears unchanged from pre-bypass.  _ Interatrial Septum: The interatrial septum appears unchanged from  pre-bypass.  _ Pericardium: The pericardium appears unchanged from pre-bypass.     Laboratory Data:  High Sensitivity Troponin:   Recent Labs  Lab 06/28/23 1853 06/28/23 2028  TROPONINIHS 51* 51*     Chemistry Recent Labs  Lab 06/23/23 0257 06/24/23 0316 06/28/23 1853  NA 139 139 138  K 4.2 4.0 5.3*  CL 100 101 103  CO2 29 28 27   GLUCOSE 123* 121* 90  BUN 27* 27* 28*  CREATININE 1.09* 1.06* 1.52*  CALCIUM  9.1 9.1 9.1  MG 2.1 2.0  --   GFRNONAA 56* 58* 38*  ANIONGAP 10 10 8     No results for input(s): PROT, ALBUMIN , AST, ALT, ALKPHOS, BILITOT in the last 168 hours. Lipids No results for input(s): CHOL, TRIG, HDL, LABVLDL, LDLCALC, CHOLHDL in the last 168 hours.  Hematology Recent Labs  Lab 06/23/23 0257 06/24/23 0316 06/28/23 1853  WBC 18.4* 18.7* 18.8*  RBC 3.68* 3.82* 4.13  HGB 9.1* 9.4* 10.2*  HCT 29.4* 30.4* 33.1*  MCV 79.9* 79.6* 80.1  MCH 24.7* 24.6* 24.7*  MCHC 31.0 30.9 30.8  RDW 17.9* 18.0* 18.2*  PLT 338 414* 650*   Thyroid No results for input(s): TSH, FREET4 in the last 168 hours.  BNP Recent Labs  Lab 06/28/23 1852  BNP 356.0*    DDimer No results for input(s): DDIMER in the last 168 hours.   Radiology/Studies:  DG Chest 2 View Result Date: 06/28/2023 CLINICAL DATA:  sob EXAM: CHEST - 2 VIEW COMPARISON:  Cxr 06/20/23 FINDINGS: Limited evaluation due to overlapping osseous structures and overlying soft tissues. The heart and mediastinal contours are unchanged. Aortic valve replacement. Left lower lung zone airspace opacity. No pulmonary edema. Small left pleural effusion. No pneumothorax. No acute osseous abnormality. Sternotomy wires intact. IMPRESSION: Limited evaluation due to overlapping osseous structures and overlying soft tissues. Suggestion of left lower lung zone airspace opacity and small left pleural effusion. Followup PA and lateral chest X-ray is recommended in 3-4 weeks following therapy to ensure resolution. Electronically Signed   By: Morgane  Naveau M.D.   On: 06/28/2023 19:46      Assessment and Plan:   Shortness of breath Acute on chronic diastolic heart failure - hx of grade 2 DD - BNP 356 - CXR with pleural effusion - received 20 mg IV lasix  - respiratory panel negative for flu/COVID - question if she is experiencing HFpEF exacerbation after valve surgery and in the setting of prednisone  use - will await labs today, suspect she will need additional 40 mg IV lasix  tomorrow   Aortic stenosis s/p SAVR 06/14/23 - will repeat a formal echo to check valve and rule out pericardial effusion   Acute on chronic renal insufficiency stage III - sCr 1.52, with K 5.3 - recheck BMP today   Leukocytosis  - WBC 18.8  - hx of chronic suppressive ABX with lumbar hardware infection - was treated with augmentin  post op for BCX positive for enterococcus faecalis - discharge WBC on 06/24/23 was 18.7 - previously normal WBC prior to surgery - repeat blood cultures pending - she is afebrile, she denies  urinary symptoms   Gout flare - she is not treated with allopurinol - she treated presumed gout flare at home with prednisone  - given cardiac history, may consider transitioning to colchicine  for future flares - given diuresis, will be mindful of repeat flare    Risk Assessment/Risk Scores:     New York  Heart Association (NYHA) Functional Class NYHA Class IV  CHA2DS2-VASc Score = 5   This indicates a 7.2% annual risk of stroke. The patient's score is based upon: CHF History: 1 HTN History: 1 Diabetes History: 1 Stroke History: 0 Vascular Disease History: 0 Age Score: 1 Gender Score: 1       For questions or updates, please contact [facility] Please consult [email or web address] for contact info under    Signed, Jon Nat Hails, PA  06/29/2023 4:07 PM

## 2023-06-29 NOTE — Plan of Care (Signed)
 Cardiology Plan of Care   Patient ID: [name] MRN: [id number]; DOB: 05-29-1955 Admit date: 06/28/2023 Date of Consult: 06/29/2023 PCP:  [name], PA-C [facility] Providers Cardiologist:  Maude Emmer, MD       Patient HPI:   [name] is a 68 y.o. female with a hx of aortic stenosis s/p SAVR (06/14/23), , lumbar surgical site OM with hardware infection on chronic suppressive antibiotic, GAD, T2DM, HLD, HFpEF, CKD III, HTN, Asthma who presented with shortness of breath after discharge on 06/24/23 post-SAVR.  Patient initially presented to [facility] ER [facility] reporting shortness of breath that has been worsening since her prior discharge. Cardiology was virtually consulted given her history.   In the ER,  VS: BP 175/96, which improved to 153/96. HR 78, RR 17, Sat 100% on 2L O2 via [place].  Labs: Trop 51x2. WBC 18.8, plt 650, K 5.3, Cr 1.5, Bun 28, BNP 356.  EKG in NSR with RBBB BSUS: reportedly without evidence of a pericardial effusion CXR: LLL airspace opacity and small left pleural effusion  Discussed case with ER provider. Patient noted to have a new AKI (Cr 1.52 from baseline ~1-1.1), with mild hyperkalemia (K 5.3) and an elevated BNP (356; baseline ~130-160) and was noted to be mildly volume overloaded on exam. Recommended IV diuresis, BP control, TTE, CT surgery evaluation and admission to hospital medicine. Please re-consult cardiology in the AM to ensure appropriate transition of care.   Physical Exam/Data:   Vitals:   06/29/23 0200 06/29/23 0225 06/29/23 0230 06/29/23 0242  BP: (!) 159/82   (!) 158/94  Pulse: 83  82 82  Resp:    16  Temp:  98 F (36.7 C)    TempSrc:  Oral    SpO2: 96%  96% 96%    Intake/Output Summary (Last 24 hours) at 06/29/2023 0356 Last data filed at 06/29/2023 0308 Gross per 24 hour  Intake 100 ml  Output --  Net 100 ml      06/24/2023    6:55 AM 06/23/2023    5:39 AM 06/22/2023    5:00 AM  Last 3  Weights  Weight (lbs) 193 lb 12.6 oz 195 lb 8 oz 196 lb 10.4 oz  Weight (kg) 87.9 kg 88.678 kg 89.2 kg     Laboratory Data:  High Sensitivity Troponin:   Recent Labs  Lab 06/28/23 1853 06/28/23 2028  TROPONINIHS 51* 51*     Chemistry Recent Labs  Lab 06/23/23 0257 06/24/23 0316 06/28/23 1853  NA 139 139 138  K 4.2 4.0 5.3*  CL 100 101 103  CO2 29 28 27   GLUCOSE 123* 121* 90  BUN 27* 27* 28*  CREATININE 1.09* 1.06* 1.52*  CALCIUM  9.1 9.1 9.1  MG 2.1 2.0  --   GFRNONAA 56* 58* 38*  ANIONGAP 10 10 8     No results for input(s): PROT, ALBUMIN , AST, ALT, ALKPHOS, BILITOT in the last 168 hours. Lipids No results for input(s): CHOL, TRIG, HDL, LABVLDL, LDLCALC, CHOLHDL in the last 168 hours.  Hematology Recent Labs  Lab 06/23/23 0257 06/24/23 0316 06/28/23 1853  WBC 18.4* 18.7* 18.8*  RBC 3.68* 3.82* 4.13  HGB 9.1* 9.4* 10.2*  HCT 29.4* 30.4* 33.1*  MCV 79.9* 79.6* 80.1  MCH 24.7* 24.6* 24.7*  MCHC 31.0 30.9 30.8  RDW 17.9* 18.0* 18.2*  PLT 338 414* 650*   Thyroid No results for input(s): TSH, FREET4 in the last 168 hours.  BNP Recent  Labs  Lab 06/28/23 1852  BNP 356.0*    DDimer No results for input(s): DDIMER in the last 168 hours.   Radiology/Studies:  DG Chest 2 View Result Date: 06/28/2023 CLINICAL DATA:  sob EXAM: CHEST - 2 VIEW COMPARISON:  Cxr 06/20/23 FINDINGS: Limited evaluation due to overlapping osseous structures and overlying soft tissues. The heart and mediastinal contours are unchanged. Aortic valve replacement. Left lower lung zone airspace opacity. No pulmonary edema. Small left pleural effusion. No pneumothorax. No acute osseous abnormality. Sternotomy wires intact. IMPRESSION: Limited evaluation due to overlapping osseous structures and overlying soft tissues. Suggestion of left lower lung zone airspace opacity and small left pleural effusion. Followup PA and lateral chest X-ray is recommended in 3-4 weeks following  therapy to ensure resolution. Electronically Signed   By: Morgane  Naveau M.D.   On: 06/28/2023 19:46   [place] Marchiano CHRISTELLA Cluster, MD  06/29/2023 3:56 AM

## 2023-06-30 DIAGNOSIS — R0600 Dyspnea, unspecified: Secondary | ICD-10-CM

## 2023-06-30 DIAGNOSIS — N179 Acute kidney failure, unspecified: Secondary | ICD-10-CM | POA: Diagnosis not present

## 2023-06-30 DIAGNOSIS — I130 Hypertensive heart and chronic kidney disease with heart failure and stage 1 through stage 4 chronic kidney disease, or unspecified chronic kidney disease: Secondary | ICD-10-CM | POA: Diagnosis not present

## 2023-06-30 DIAGNOSIS — Z952 Presence of prosthetic heart valve: Secondary | ICD-10-CM

## 2023-06-30 LAB — GLUCOSE, CAPILLARY
Glucose-Capillary: 99 mg/dL (ref 70–99)
Glucose-Capillary: 92 mg/dL (ref 70–99)
Glucose-Capillary: 85 mg/dL (ref 70–99)
Glucose-Capillary: 106 mg/dL — ABNORMAL HIGH (ref 70–99)

## 2023-06-30 LAB — BASIC METABOLIC PANEL
Anion gap: 13 (ref 5–15)
BUN: 16 mg/dL (ref 8–23)
CO2: 25 mmol/L (ref 22–32)
Calcium: 9.3 mg/dL (ref 8.9–10.3)
Chloride: 101 mmol/L (ref 98–111)
Creatinine, Ser: 1.41 mg/dL — ABNORMAL HIGH (ref 0.44–1.00)
GFR, Estimated: 41 mL/min — ABNORMAL LOW (ref >60)
Glucose, Bld: 157 mg/dL — ABNORMAL HIGH (ref 70–99)
Potassium: 4.5 mmol/L (ref 3.5–5.1)
Sodium: 139 mmol/L (ref 135–145)

## 2023-06-30 MED ORDER — AMLODIPINE BESYLATE 5 MG PO TABS
5.0000 mg | ORAL_TABLET | Freq: Every day | ORAL | Status: DC
  Administered 2023-06-30 – 2023-07-01 (×2): 5 mg via ORAL
  Filled 2023-06-30 (×2): qty 1

## 2023-06-30 MED ORDER — DIPHENHYDRAMINE HCL 25 MG PO CAPS
25.0000 mg | ORAL_CAPSULE | Freq: Every evening | ORAL | Status: DC | PRN
  Administered 2023-06-30: 25 mg via ORAL
  Filled 2023-06-30: qty 1

## 2023-06-30 NOTE — Progress Notes (Signed)
   Patient Name: Olivia  DANYELLA [name] Date of Encounter: 06/30/2023 [facility] Cardiologist: Maude Emmer, MD   Interval Summary  .    Feeling well. Breathing is at baseline.  She feels like the fluid is gone.   Vital Signs .    Vitals:   06/29/23 2005 06/29/23 2308 06/30/23 0316 06/30/23 0748  BP: 132/67 (!) 141/84 124/65 (!) 165/83  Pulse: 91 90 84   Resp: 20 16 15 16   Temp: 98.5 F (36.9 C) 98.5 F (36.9 C) 98.3 F (36.8 C) 98.5 F (36.9 C)  TempSrc: Oral Oral Oral Oral  SpO2: 97% 100% 99% 98%  Weight:   87.1 kg     Intake/Output Summary (Last 24 hours) at 06/30/2023 1123 Last data filed at 06/29/2023 2124 Gross per 24 hour  Intake 240 ml  Output --  Net 240 ml      06/30/2023    3:16 AM 06/24/2023    6:55 AM 06/23/2023    5:39 AM  Last 3 Weights  Weight (lbs) 192 lb 0.3 oz 193 lb 12.6 oz 195 lb 8 oz  Weight (kg) 87.1 kg 87.9 kg 88.678 kg      Telemetry/ECG    Sinus rhythm.  Sinus tachycardia.  PVCs.  - Personally Reviewed  Physical Exam .   GEN: No acute distress.   Neck: No JVD Cardiac: RRR, II/VI systolic murmur at the LUSB, rubs, or gallops.  Respiratory: Clear to auscultation bilaterally. GI: Soft, nontender, non-distended  MS: No edema  Assessment & Plan .     Ms. Schueller is a 14F with hypertension, HFpEF, CKD 3B, obesity, chronic osteomyelitis and recent surgical aortic valve replacement admitted with shortness of breath.   # Acute on chronic HFpEF: Patient admitted with increasing shortness of breath.  BNP elevated to the 300s on admission.  She has not been weighing herself at home.  No weight has been recorded yet and ins and outs are not recorded after receiving 20 mg of IV Lasix .  She does report feeling better with diuresis.  She feels that her breathing and volume are at baseline.  Echo pending.  She will likely need lasix  40mg  po to be taken as needed upon discharge.    # Status post surgical aortic valve replacement: Patient underwent  valve replacement last month.  Bedside echo was unrevealing.  Will repeat a full echo to better assess her valve.  Of course there is concern given her persistent leukocytosis and chronic osteomyelitis with persistently elevated CRP.  Fortunately CRP is significantly improved this admission.  2.7 down from 30.7 last month.   # Hypertension: At home she is on amlodipine , losartan , and metoprolol . Amlodipine  and losartan  have been held.  BP is very labile.  She had hyperkalemia on admission.  Will resume amlodipine  5mg  for now.  BMP is pending.   # PAF:  Maintaining sinus rhythm.  Continue amiodarone , metoprolol  and Eliquis .   # Hyperlipidemia:  Continue rosuvastatin  and Zetia     For questions or updates, please contact [facility] Please consult [email or web address] for contact info under        Signed, Annabella Scarce, MD

## 2023-06-30 NOTE — Discharge Instructions (Signed)
 Low Sodium Nutrition Therapy  Eating less sodium can help you if you have high blood pressure, heart failure, or kidney or liver disease.   Your body needs a little sodium, but too much sodium can cause your body to hold onto extra water. This extra water will raise your blood pressure and can cause damage to your heart, kidneys, or liver as they are forced to work harder.   Sometimes you can see how the extra fluid affects you because your hands, legs, or belly swell. You may also hold water around your heart and lungs, which makes it hard to breathe.   Even if you take medication for blood pressure or a water pill (diuretic) to remove fluid, it is still important to have less salt in your diet.   Check with your primary care provider before drinking alcohol since it may affect the amount of fluid in your body and how your heart, kidneys, or liver work. Sodium in Food A low-sodium meal plan limits the sodium that you get from food and beverages to 1,500-2,000 milligrams (mg) per day. Salt is the main source of sodium. Read the nutrition label on the package to find out how much sodium is in one serving of a food.  Select foods with 140 milligrams (mg) of sodium or less per serving.  You may be able to eat one or two servings of foods with a little more than 140 milligrams (mg) of sodium if you are closely watching how much sodium you eat in a day.  Check the serving size on the label. The amount of sodium listed on the label shows the amount in one serving of the food. So, if you eat more than one serving, you will get more sodium than the amount listed.  Tips Cutting Back on Sodium Eat more fresh foods.  Fresh fruits and vegetables are low in sodium, as well as frozen vegetables and fruits that have no added juices or sauces.  Fresh meats are lower in sodium than processed meats, such as bacon, sausage, and hotdogs.  Not all processed foods are unhealthy, but some processed foods may have too  much sodium.  Eat less salt at the table and when cooking. One of the ingredients in salt is sodium.  One teaspoon of table salt has 2,300 milligrams of sodium.  Leave the salt out of recipes for pasta, casseroles, and soups. Be a engineer, building services.  Food packages that say "Salt-free", sodium-free", "very low sodium," and "low sodium" have less than 140 milligrams of sodium per serving.  Beware of products identified as "Unsalted," "No Salt Added," "Reduced Sodium," or "Lower Sodium." These items may still be high in sodium. You should always check the nutrition label. Add flavors to your food without adding sodium.  Try lemon juice, lime juice, or vinegar.  Dry or fresh herbs add flavor.  Buy a sodium-free seasoning blend or make your own at home. You can purchase salt-free or sodium-free condiments like barbeque sauce in stores and online. Ask your registered dietitian nutritionist for recommendations and where to find them.   Eating in Restaurants Choose foods carefully when you eat outside your home. Restaurant foods can be very high in sodium. Many restaurants provide nutrition facts on their menus or their websites. If you cannot find that information, ask your server. Let your server know that you want your food to be cooked without salt and that you would like your salad dressing and sauces to be served on the  side.    Foods Recommended Food Group Foods Recommended  Grains Bread, bagels, rolls without salted tops Homemade bread made with reduced-sodium baking powder Cold cereals, especially shredded wheat and puffed rice Oats, grits, or cream of wheat Pastas, quinoa, and rice Popcorn, pretzels or crackers without salt Corn tortillas  Protein Foods Fresh meats and fish; turkey bacon (check the nutrition labels - make sure they are not packaged in a sodium solution) Canned or packed tuna (no more than 4 ounces at 1 serving) Beans and peas Soybeans) and tofu Eggs Nuts or nut butters  without salt  Dairy Milk or milk powder Plant milks, such as rice and soy Yogurt, including Greek yogurt Small amounts of natural cheese (blocks of cheese) or reduced-sodium cheese can be used in moderation. (Swiss, ricotta, and fresh mozzarella cheese are lower in sodium than the others) Cream Cheese Low sodium cottage cheese  Vegetables Fresh and frozen vegetables without added sauces or salt Homemade soups (without salt) Low-sodium, salt-free or sodium-free canned vegetables and soups  Fruit Fresh and canned fruits Dried fruits, such as raisins, cranberries, and prunes  Oils Tub or liquid margarine, regular or without salt Canola, corn, peanut, olive, safflower, or sunflower oils  Condiments Fresh or dried herbs such as basil, bay leaf, dill, mustard (dry), nutmeg, paprika, parsley, rosemary, sage, or thyme.  Low sodium ketchup Vinegar  Lemon or lime juice Pepper, red pepper flakes, and cayenne. Hot sauce contains sodium, but if you use just a drop or two, it will not add up to much.  Salt-free or sodium-free seasoning mixes and marinades Simple salad dressings: vinegar and oil   Foods Not Recommended Food Group Foods Not Recommended  Grains Breads or crackers topped with salt Cereals (hot/cold) with more than 300 mg sodium per serving Biscuits, cornbread, and other "quick" breads prepared with baking soda Pre-packaged bread crumbs Seasoned and packaged rice and pasta mixes Self-rising flours  Protein Foods Cured meats: Bacon, ham, sausage, pepperoni and hot dogs Canned meats (chili, vienna sausage, or sardines) Smoked fish and meats Frozen meals that have more than 600 mg of sodium per serving Egg substitute (with added sodium)  Dairy Buttermilk Processed cheese spreads Cottage cheese (1 cup may have over 500 mg of sodium; look for low-sodium.) American or feta cheese Shredded Cheese has more sodium than blocks of cheese String cheese  Vegetables Canned vegetables  (unless they are salt-free, sodium-free or low sodium) Frozen vegetables with seasoning and sauces Sauerkraut and pickled vegetables Canned or dried soups (unless they are salt-free, sodium-free, or low sodium) French fries and onion rings  Fruit Dried fruits preserved with additives that have sodium  Oils Salted butter or margarine, all types of olives  Condiments Salt, sea salt, kosher salt, onion salt, and garlic salt Seasoning mixes with salt Bouillon cubes Ketchup Barbeque sauce and Worcestershire sauce unless low sodium Soy sauce Salsa, pickles, olives, relish Salad dressings: ranch, blue cheese, Italian, and French.   Low Sodium Sample 1-Day Menu  Breakfast 1 cup cooked oatmeal  1 slice whole wheat bread toast  1 tablespoon peanut butter without salt  1 banana  1 cup 1% milk  Lunch Tacos made with: 2 corn tortillas   cup black beans, low sodium   cup roasted or grilled chicken (without skin)   avocado  Squeeze of lime juice  1 cup salad greens  1 tablespoon low-sodium salad dressing   cup strawberries  1 orange  Afternoon Snack 1/3 cup grapes  6 ounces yogurt  Evening Meal 3 ounces herb-baked fish  1 baked potato  2 teaspoons olive oil   cup cooked carrots  2 thick slices tomatoes on:  2 lettuce leaves  1 teaspoon olive oil  1 teaspoon balsamic vinegar  1 cup 1% milk  Evening Snack 1 apple   cup almonds without salt   Low-Sodium Vegetarian (Lacto-Ovo) Sample 1-Day Menu  Breakfast 1 cup cooked oatmeal  1 slice whole wheat toast  1 tablespoon peanut butter without salt  1 banana  1 cup 1% milk  Lunch Tacos made with: 2 corn tortillas   cup black beans, low sodium   cup roasted or grilled chicken (without skin)   avocado  Squeeze of lime juice  1 cup salad greens  1 tablespoon low-sodium salad dressing   cup strawberries  1 orange  Evening Meal Stir fry made with:  cup tofu  1 cup brown rice   cup broccoli   cup green beans   cup  peppers   tablespoon peanut oil  1 orange  1 cup 1% milk  Evening Snack 4 strips celery  2 tablespoons hummus  1 hard-boiled egg   Low-Sodium Vegan Sample 1-Day Menu  Breakfast 1 cup cooked oatmeal  1 tablespoon peanut butter without salt  1 cup blueberries  1 cup soymilk fortified with calcium , vitamin B12, and vitamin D  Lunch 1 small whole wheat pita   cup cooked lentils  2 tablespoons hummus  4 carrot sticks  1 medium apple  1 cup soymilk fortified with calcium , vitamin B12, and vitamin D  Evening Meal Stir fry made with:  cup tofu  1 cup brown rice   cup broccoli   cup green beans   cup peppers   tablespoon peanut oil  1 cup cantaloupe  Evening Snack 1 cup soy yogurt   cup mixed nuts  Copyright 2020  Academy [employer]. All rights reserved  Sodium Free Flavoring Tips  When cooking, the following items may be used for flavoring instead of salt or seasonings that contain sodium. Remember: A little bit of spice goes a long way! Be careful not to overseason. Spice Blend Recipe (makes about ? cup) 5 teaspoons onion powder  2 teaspoons garlic powder  2 teaspoons paprika  2 teaspoon dry mustard  1 teaspoon crushed thyme leaves   teaspoon white pepper   teaspoon celery seed Food Item Flavorings  Beef Basil, bay leaf, caraway, curry, dill, dry mustard, garlic, grape jelly, green pepper, mace, marjoram, mushrooms (fresh), nutmeg, onion or onion powder, parsley, pepper, rosemary, sage  Chicken Basil, cloves, cranberries, mace, mushrooms (fresh), nutmeg, oregano, paprika, parsley, pineapple, saffron, sage, savory, tarragon, thyme, tomato, turmeric  Egg Chervil, curry, dill, dry mustard, garlic or garlic powder, green pepper, jelly, mushrooms (fresh), nutmeg, onion powder, paprika, parsley, rosemary, tarragon, tomato  Fish Basil, bay leaf, chervil, curry, dill, dry mustard, green pepper, lemon juice, marjoram, mushrooms (fresh), paprika, pepper,  tarragon, tomato, turmeric  Lamb Cloves, curry, dill, garlic or garlic powder, mace, mint, mint jelly, onion, oregano, parsley, pineapple, rosemary, tarragon, thyme  Pork Applesauce, basil, caraway, chives, cloves, garlic or garlic powder, onion or onion powder, rosemary, thyme  Veal Apricots, basil, bay leaf, currant jelly, curry, ginger, marjoram, mushrooms (fresh), oregano, paprika  Vegetables Basil, dill, garlic or garlic powder, ginger, lemon juice, mace, marjoram, nutmeg, onion or onion powder, tarragon, tomato, sugar or sugar substitute, salt-free salad dressing, vinegar  Desserts Allspice, anise, cinnamon, cloves, ginger, mace, nutmeg, vanilla extract, other  extracts   Copyright 2020  Academy of [employer]. All rights reserved  Fluid Restricted Nutrition Therapy  You have been prescribed this diet because your condition affects how much fluid you can eat or drink. If your heart, liver, or kidneys aren't working properly, you may not be able to effectively eliminate fluids from the body and this may cause swelling (edema) in the legs, arms, and/or stomach. Drink no more than _1.5_ liters or _50_ ounces or __6 and 1/4__cups of fluid per day. (Or as prescribed by your doctor) You don't need to stop eating or drinking the same fluids you normally would, but you may need to eat or drink less than usual.  Your registered dietitian nutritionist will help you determine the correct amount of fluid to consume during the day Breakfast Include fluids taken with medications  Lunch Include fluids taken with medications  Dinner Include fluids taken with medications  Bedtime Snack Include fluids taken with medications     Tips What Are Fluids?  A fluid is anything that is liquid or anything that would melt if left at room temperature. You will need to count these foods and liquids--including any liquid used to take medication--as part of your daily fluid intake. Some examples  are: Alcohol (drink only with your doctor's permission)  Coffee, tea, and other hot beverages  Gelatin (Jell-O)  Gravy  Ice cream, sherbet, sorbet  Ice cubes, ice chips  Milk, liquid creamer  Nutritional supplements  Popsicles  Vegetable and fruit juices; fluid in canned fruit  Watermelon  Yogurt  Soft drinks, lemonade, limeade  Soups  Syrup How Do I Measure My Fluid Intake? Record your fluid intake daily.  Tip: Every day, each time you eat or drink fluids, pour water in the same amount into an empty container that can hold the same amount of fluids you are allowed daily. This may help you keep track of how much fluid you are taking in throughout the day.  To accurately keep track of how much liquid you take in, measure the size of the cups, glasses, and bowls you use. If you eat soup, measure how much of it is liquid and how much is solid (such as noodles, vegetables, meat). Conversions for Measuring Fluid Intake  Milliliters (mL) Liters (L) Ounces (oz) Cups (c)  1000 1 32 4  1200 1.2 40 5  1500 1.5 50 6 1/4  1800 1.8 60 7 1/2  2000 2 67 8 1/3  Tips to Reduce Your Thirst Chew gum or suck on hard candy.  Rinse or gargle with mouthwash. Do not swallow.  Ice chips or popsicles my help quench thirst, but this too needs to be calculated into the total restriction. Melt ice chips or cubes first to figure out how much fluid they produce (for example, experiment with melting  cup ice chips or 2 ice cubes).  Add a lemon wedge to your water.  Limit how much salt you take in. A high salt intake might make you thirstier.  Don't eat or drink all your allowed liquids at once. Space your liquids out through the day.  Use small glasses and cups and sip slowly. If allowed, take your medications with fluids you eat or drink during a meal.   Fluid-Restricted Nutrition Therapy Sample 1-Day Menu  Breakfast 1 slice wheat toast  1 tablespoon peanut butter  1/2 cup yogurt (120 milliliters)  1/2 cup  blueberries  1 cup milk (240 milliliters)   Lunch 3 ounces  sliced turkey  2 slices whole wheat bread  1/2 cup lettuce for sandwich  2 slices tomato for sandwich  1 ounce reduced-fat, reduced-sodium cheese  1/2 cup fresh carrot sticks  1 banana  1 cup unsweetened tea (240 milliliters)   Evening Meal 8 ounces soup (240 milliliters)  3 ounces salmon  1/2 cup quinoa  1 cup green beans  1 cup mixed greens salad  1 tablespoon olive oil  1 cup coffee (240 milliliters)  Evening Snack 1/2 cup sliced peaches  1/2 cup frozen yogurt (120 milliliters)  1 cup water (240 milliliters)  Copyright 2020  Academy of [employer] Dietetics. All rights reserved

## 2023-06-30 NOTE — Evaluation (Signed)
 Occupational Therapy Evaluation Patient Details Name: [name] MRN: [id number] DOB: 08-24-1955 Today's Date: 06/30/2023   History of Present Illness 68 yo female admitted 2/4 with dyspnea, AKI, HTN emergency, respiratory failure and acute on chronic HFpEF.  PMhx: recent admission 1/21-1/31 for AVR. Hardware removal L3-4, S1 with PLIF 01/14/23, anxiety, aortic stenosis, obesity, T2DM, HTN, and osteoarthritis of Lt hip.   Clinical Impression   At baseline, pt completes ADLs and IADLs Independent to Mod I with assist of husband for home management tasks PRN. Pt now at or near baseline PLOF with pt able to complete ADLs Independent to Supervision while adhering to sternal precautions. OT answered pt and husband questions regarding energy conservation strategies and reviewed education in sternal precautions and importance of following heart-healthy low-sodium diet, walking program and progression, and daily weights with pt and husband verbalizing understanding and agreement. All education completed and no further acute OT or equipment needs identified at this time. OT signing off at this time.       If plan is discharge home, recommend the following: Assistance with cooking/housework;Assist for transportation;A little help with bathing/dressing/bathroom (PRN assist)    Functional Status Assessment     Equipment Recommendations       Recommendations for Other Services       Precautions / Restrictions Precautions Precautions: Sternal Precaution Booklet Issued: No (pt provided  with handout during prior admission) Precaution Comments: Pt stating sternal precautions Independently this session. Restrictions Other Position/Activity Restrictions: sternal precautions      Mobility Bed Mobility               General bed mobility comments: Pt sitting in recliner at beginning and end of session    Transfers Overall transfer level: Independent                         Balance Overall balance assessment: No apparent balance deficits (not formally assessed)                                         ADL either performed or assessed with clinical judgement   ADL Overall ADL's : Independent;Modified independent;Needs assistance/impaired                                       General ADL Comments: Pt currently at or close to baseline PLOF with pt demonstrating ability to complete ADLs Independent to Supervision. Pt also reports no issues with completing ADLs at home since prior discharge. OT answered pt questions regarding and reviews energy conservation strategies during ADLs and reviewed education in heart healthy diet, reguular activity within sternal precautions, and importance of daily weights with pt and husband verbalizing and demonstrating understanding through teach back.     Vision Baseline Vision/History: 0 No visual deficits Ability to See in Adequate Light: 0 Adequate Patient Visual Report: No change from baseline       Perception         Praxis         Pertinent Vitals/Pain Pain Assessment Pain Assessment: No/denies pain     Extremity/Trunk Assessment Upper Extremity Assessment Upper Extremity Assessment: Overall WFL for tasks assessed (within sternal precautions)   Lower Extremity Assessment Lower Extremity Assessment: Defer to PT evaluation   Cervical / Trunk Assessment  Cervical / Trunk Assessment: Other exceptions Cervical / Trunk Exceptions: sternal incision; hx of back surgeries   Communication Communication Communication: No apparent difficulties   Cognition Arousal: Alert Behavior During Therapy: WFL for tasks assessed/performed Overall Cognitive Status: Within Functional Limits for tasks assessed                                 General Comments: AAOx4 and pleasant throughout session. Able to Independently state and adhere to sternal precautions this session. Good safety  awareness and good insight into deficits. Asked good questions during session.     General Comments  VSS on RA. Pt reports no SOB at this time. Husband present throughout session. RN present at end of session.    Exercises     Shoulder Instructions      Home Living Family/patient expects to be discharged to:: Private residence Living Arrangements: Spouse/significant other Available Help at Discharge: Family;Available 24 hours/day Type of Home: House Home Access: Level entry     Home Layout: Two level;Bed/bath upstairs Alternate Level Stairs-Number of Steps: 13 Alternate Level Stairs-Rails: Left Bathroom Shower/Tub: Producer, Television/film/video: Standard     Home Equipment: Agricultural Consultant (2 wheels);Rollator (4 wheels);Grab bars - tub/shower;Grab bars - toilet;Shower seat;Hand held shower head;Cane - single point          Prior Functioning/Environment Prior Level of Function : Independent/Modified Independent;Driving             Mobility Comments: At baseline, pt performs funcitonal mobility with Mod I and uses a SPC in home and on stairs, used RW in community ADLs Comments: At baseline, pt completes ADLs and IADLs Independent to Mod I and drives. Pt's husband assists with home management tasks PRN. 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        OT Problem List:        OT Treatment/Interventions:      OT Goals(Current goals can be found in the care plan section) Acute Rehab OT Goals Patient Stated Goal: to return home  OT Frequency:      Co-evaluation              AM-PAC OT 6 Clicks Daily Activity     Outcome Measure Help from another person eating meals?: None Help from another person taking care of personal grooming?: None Help from another person toileting, which includes using toliet, bedpan, or urinal?: None Help from another person bathing (including washing, rinsing, drying)?: A Little (recommend Supervision for safety) Help from  another person to put on and taking off regular upper body clothing?: None Help from another person to put on and taking off regular lower body clothing?: None 6 Click Score: 23   End of Session Nurse Communication: Mobility status;Other (comment) (Pt with question regarding medication for SOB)  Activity Tolerance: Patient tolerated treatment well Patient left: in chair;with call bell/phone within reach;with nursing/sitter in room;with family/visitor present  OT Visit Diagnosis: Other (comment) (decreased activity tolerance)                Time: 1050-1102 OT Time Calculation (min): 12 min Charges:  OT General Charges $OT Visit: 1 Visit OT Evaluation $OT Eval Low Complexity: 1 Low  Olivia Rockey HERO., OTR/L, MA Acute Rehab [phone]   [name] Horns 06/30/2023, 11:17 AM

## 2023-06-30 NOTE — Evaluation (Signed)
 Physical Therapy Brief Evaluation and Discharge Note Patient Details Name: Olivia  KRYSTALLE [name] MRN: [id number] DOB: 11/22/1955 Today's Date: 06/30/2023   History of Present Illness  68 yo female admitted 2/4 with dyspnea, AKI, HTN emergency, respiratory failure and acute on chronic HFpEF.  PMhx: recent admission 1/21-1/31 for AVR. Hardware removal L3-4, S1 with PLIF 01/14/23, anxiety, aortic stenosis, obesity, T2DM, HTN, and osteoarthritis of Lt hip.  Clinical Impression  Pt pleasant and states she has been following sternal precautions, spouse does the cooking and pt stated she hasn't weighed for some time with scale in the closet. Pt educated for heart healthy, low sodium diet, walking program and progression, daily weights and sternal precautions. Pt moving well with only min cues to maintain precautions throughout. Pt with needed assist of spouse at home and no further acute therapy needs. Will sign off with pt aware and agreeable with daily ambulation recommended.   HR 98-120 SpO2 >98% on RA       PT Assessment Patient does not need any further PT services  Assistance Needed at Discharge  PRN    Equipment Recommendations None recommended by PT  Recommendations for Other Services       Precautions/Restrictions Precautions Precautions: Sternal        Mobility  Bed Mobility Rolling: Supervision     General bed mobility comments: cues to maintain precautions and not push  Transfers Overall transfer level: Independent                      Ambulation/Gait Ambulation/Gait assistance: Modified independent (Device/Increase time) Gait Distance (Feet): 500 Feet Assistive device: Rollator (4 wheels) Gait Pattern/deviations: WFL(Within Functional Limits) Gait Speed: Pace WFL    Home Activity Instructions    Stairs Stairs: Yes Stairs assistance: Supervision Stair Management: Sideways, One rail Left Number of Stairs: 11 General stair comments: pt with sideways  pattern facing rail with limited use of UB to support self on rail, step to pattern with good safety  Modified Rankin (Stroke Patients Only)        Balance Overall balance assessment: Mild deficits observed, not formally tested                        Pertinent Vitals/Pain PT - Brief Vital Signs All Vital Signs Stable: Yes (SPO2 >98% on RA with activity, HR 98-120) Pain Assessment Pain Assessment: No/denies pain     Home Living Family/patient expects to be discharged to:: Private residence Living Arrangements: Spouse/significant other Available Help at Discharge: Family;Available PRN/intermittently Home Environment: Level entry;Stairs in home  Stairs-Number of Steps: 14 Home Equipment: Rolling Walker (2 wheels);Rollator (4 wheels);Grab bars - tub/shower;Grab bars - toilet;Shower seat;Hand held Engineer, Civil (consulting) - single point        Prior Function Level of Independence: Independent with assistive device(s) Comments: rollator since AVR    UE/LE Assessment   UE ROM/Strength/Tone/Coordination: WFL (limited by sternal precautions)    LE ROM/Strength/Tone/Coordination: [facility]      Communication   Communication Communication: No apparent difficulties     Cognition Overall Cognitive Status: Appears within functional limits for tasks assessed/performed       General Comments      Exercises     Assessment/Plan    PT Problem List         PT Visit Diagnosis Other abnormalities of gait and mobility (R26.89)    No Skilled PT All education completed;Patient will have necessary level of assist by caregiver at discharge  Co-evaluation                AMPAC 6 Clicks Help needed turning from your back to your side while in a flat bed without using bedrails?: None Help needed moving from lying on your back to sitting on the side of a flat bed without using bedrails?: None Help needed moving to and from a bed to a chair (including a wheelchair)?:  None Help needed standing up from a chair using your arms (e.g., wheelchair or bedside chair)?: A Little Help needed to walk in hospital room?: A Little Help needed climbing 3-5 steps with a railing? : A Little 6 Click Score: 21      End of Session   Activity Tolerance: Patient tolerated treatment well Patient left: with call bell/phone within reach;in chair Nurse Communication: Mobility status PT Visit Diagnosis: Other abnormalities of gait and mobility (R26.89)     Time: 9097-9069 PT Time Calculation (min) (ACUTE ONLY): 28 min  Charges:   PT Evaluation $PT Eval Low Complexity: 1 Low PT Treatments $Therapeutic Activity: 8-22 mins    Lenoard SQUIBB, PT Acute Rehabilitation Services Office: [phone]   Dandrae Kustra B Joniah Bednarski  06/30/2023, 10:14 AM

## 2023-06-30 NOTE — Progress Notes (Signed)
 Heart Failure Navigator Progress Note  Assessed for Heart & Vascular TOC clinic readiness.  Patient does not meet criteria due to EF 60-65%, has a scheduled [facility] appointment on 07/15/23.  Navigator will sign off at this time.   Stephane Haddock, BSN, Scientist, Clinical (histocompatibility And Immunogenetics) Only

## 2023-06-30 NOTE — Progress Notes (Signed)
 PROGRESS NOTE    Olivia  HARLI [name]  FMW:[id number] DOB: 04/21/1956 DOA: 06/28/2023 PCP: [name], PA-C    Brief Narrative:  Olivia  SHARNE [name] is a 68 y.o. 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  Patient found to be in AKI and hypertensive emergency and respiratory failure.    Assessment and Plan: * Dyspnea 2/2 to volume overload and hypertensive urgency.  -given lasix  x 1 in ER with improvement  Acute on chronic heart failure with preserved ejection fraction (HFpEF) (HCC) -cards consult -repeat echo pending  S/P AVR (aortic valve replacement) and aortoplasty -outpatient CVTS follow up   AKI (acute kidney injury) (HCC) -continue to await labs   Essential hypertension -BP controlled  Gastroesophageal reflux disease without esophagitis -PPI  DM type 2 (diabetes mellitus, type 2) (HCC) -SSI  Iron  deficiency anemia -no labs        DVT prophylaxis:  apixaban  (ELIQUIS ) tablet 5 mg    Code Status: Full Code   Disposition Plan:  Level of care: Telemetry Cardiac Status is: Inpatient Remains inpatient appropriate     Consultants:  cards   Subjective: No further SOB- walking hallways with PT  Objective: Vitals:   06/29/23 2005 06/29/23 2308 06/30/23 0316 06/30/23 0748  BP: 132/67 (!) 141/84 124/65 (!) 165/83  Pulse: 91 90 84   Resp: 20 16 15 16   Temp: 98.5 F (36.9 C) 98.5 F (36.9 C) 98.3 F (36.8 C) 98.5 F (36.9 C)  TempSrc: Oral Oral Oral Oral  SpO2: 97% 100% 99% 98%   Weight:   87.1 kg     Intake/Output Summary (Last 24 hours) at 06/30/2023 1235 Last data filed at 06/29/2023 2124 Gross per 24 hour  Intake 240 ml  Output --  Net 240 ml   Filed Weights   06/30/23 0316  Weight: 87.1 kg    Examination:   General: Appearance:    Obese female in no acute distress     Lungs:     Clear b/l, respirations unlabored     MS:   All extremities are intact.    Neurologic:   Awake, alert, oriented x 3. No apparent focal neurological           defect.        Data Reviewed: I have personally reviewed following labs and imaging studies  CBC: Recent Labs  Lab 06/24/23 0316 06/28/23 1853 06/29/23 1549  WBC 18.7* 18.8* 14.5*  NEUTROABS 14.0*  --   --   HGB 9.4* 10.2* 11.0*  HCT 30.4* 33.1* 35.2*  MCV 79.6* 80.1 79.6*  PLT 414* 650* 646*   Basic Metabolic Panel: Recent Labs  Lab 06/24/23 0316 06/28/23 1853 06/29/23 1549  NA 139 138 139  K 4.0 5.3* 4.4  CL 101 103 102  CO2 28 27 25   GLUCOSE 121* 90 104*  BUN 27* 28* 21  CREATININE 1.06* 1.52* 1.41*  CALCIUM  9.1 9.1 9.1  MG 2.0  --   --    GFR: Estimated Creatinine Clearance: 35.9 mL/min (A) (by C-G formula based on SCr of 1.41 mg/dL (H)). Liver Function Tests: No results for input(s): AST,  ALT, ALKPHOS, BILITOT, PROT, ALBUMIN  in the last 168 hours. No results for input(s): LIPASE, AMYLASE in the last 168 hours. No results for input(s): AMMONIA in the last 168 hours. Coagulation Profile: No results for input(s): INR, PROTIME in the last 168 hours. Cardiac Enzymes: No results for input(s): CKTOTAL, CKMB, CKMBINDEX, TROPONINI in the last 168 hours. BNP (last 3 results) Recent Labs    04/27/23 1113  PROBNP 315*   HbA1C: No results for input(s): HGBA1C in the last 72 hours. CBG: Recent Labs  Lab 06/24/23 0708 06/29/23 1646 06/29/23 2110 06/30/23 0603 06/30/23 1141  GLUCAP 93 92 159* 99 92   Lipid Profile: No results for input(s): CHOL,  HDL, LDLCALC, TRIG, CHOLHDL, LDLDIRECT in the last 72 hours. Thyroid Function Tests: No results for input(s): TSH, T4TOTAL, FREET4, T3FREE, THYROIDAB in the last 72 hours. Anemia Panel: No results for input(s): VITAMINB12, FOLATE, FERRITIN, TIBC, IRON , RETICCTPCT in the last 72 hours. Sepsis Labs: Recent Labs  Lab 06/29/23 1621  PROCALCITON <0.10    Recent Results (from the past 240 hours)  Resp panel by RT-PCR (RSV, Flu A&B, Covid) Anterior Nasal Swab     Status: None   Collection Time: 06/28/23 10:56 PM   Specimen: Anterior Nasal Swab  Result Value Ref Range Status   SARS Coronavirus 2 by RT PCR NEGATIVE NEGATIVE Final    Comment: (NOTE) SARS-CoV-2 target nucleic acids are NOT DETECTED.  The SARS-CoV-2 RNA is generally detectable in upper respiratory specimens during the acute phase of infection. The lowest concentration of SARS-CoV-2 viral copies this assay can detect is 138 copies/mL. A negative result does not preclude SARS-Cov-2 infection and should not be used as the sole basis for treatment or other patient management decisions. A negative result may occur with  improper specimen collection/handling, submission of specimen other than nasopharyngeal swab, presence of viral mutation(s) within the areas targeted by this assay, and inadequate number of viral copies(<138 copies/mL). A negative result must be combined with clinical observations, patient history, and epidemiological information. The expected result is Negative.  Fact Sheet for Patients:  bloggercourse.com  Fact Sheet for Healthcare Providers:  seriousbroker.it  This test is no t yet approved or cleared by the [place]  FDA and  has been authorized for detection and/or diagnosis of SARS-CoV-2 by FDA under an Emergency Use Authorization (EUA). This EUA will remain  in effect (meaning this test can be used) for the duration  of the COVID-19 declaration under Section 564(b)(1) of the Act, 21 U.S.C.section 360bbb-3(b)(1), unless the authorization is terminated  or revoked sooner.       Influenza A by PCR NEGATIVE NEGATIVE Final   Influenza B by PCR NEGATIVE NEGATIVE Final    Comment: (NOTE) The Xpert Xpress SARS-CoV-2/FLU/RSV plus assay is intended as an aid in the diagnosis of influenza from Nasopharyngeal swab specimens and should not be used as a sole basis for treatment. Nasal washings and aspirates are unacceptable for Xpert Xpress SARS-CoV-2/FLU/RSV testing.  Fact Sheet for Patients: bloggercourse.com  Fact Sheet for Healthcare Providers: seriousbroker.it  This test is not yet approved or cleared by the [place]  FDA and has been authorized for detection and/or diagnosis of SARS-CoV-2 by FDA under an Emergency Use Authorization (EUA). This EUA will remain in effect (meaning this test can be used) for the duration of the COVID-19 declaration under Section 564(b)(1) of the Act, 21 U.S.C. section 360bbb-3(b)(1), unless the authorization is terminated or revoked.     Resp Syncytial Virus by PCR NEGATIVE  NEGATIVE Final    Comment: (NOTE) Fact Sheet for Patients: bloggercourse.com  Fact Sheet for Healthcare Providers: seriousbroker.it  This test is not yet approved or cleared by the [place]  FDA and has been authorized for detection and/or diagnosis of SARS-CoV-2 by FDA under an Emergency Use Authorization (EUA). This EUA will remain in effect (meaning this test can be used) for the duration of the COVID-19 declaration under Section 564(b)(1) of the Act, 21 U.S.C. section 360bbb-3(b)(1), unless the authorization is terminated or revoked.  Performed at [facility], [address] [place], [place] [address]   Culture, blood (Routine X 2) w Reflex to ID Panel      Status: None (Preliminary result)   Collection Time: 06/29/23  3:47 PM   Specimen: BLOOD LEFT HAND  Result Value Ref Range Status   Specimen Description BLOOD LEFT HAND  Final   Special Requests   Final    BOTTLES DRAWN AEROBIC AND ANAEROBIC Blood Culture results may not be optimal due to an inadequate volume of blood received in culture bottles   Culture   Final    NO GROWTH < 12 HOURS Performed at [facility], [address] [place], [place] [address]    Report Status PENDING  Incomplete  Culture, blood (Routine X 2) w Reflex to ID Panel     Status: None (Preliminary result)   Collection Time: 06/29/23  3:49 PM   Specimen: BLOOD RIGHT ARM  Result Value Ref Range Status   Specimen Description BLOOD RIGHT ARM  Final   Special Requests   Final    BOTTLES DRAWN AEROBIC ONLY Blood Culture results may not be optimal due to an inadequate volume of blood received in culture bottles   Culture   Final    NO GROWTH < 24 HOURS Performed at [facility], [address] [place], [place] [address]    Report Status PENDING  Incomplete         Radiology Studies: DG Chest 2 View Result Date: 06/28/2023 CLINICAL DATA:  sob EXAM: CHEST - 2 VIEW COMPARISON:  Cxr 06/20/23 FINDINGS: Limited evaluation due to overlapping osseous structures and overlying soft tissues. The heart and mediastinal contours are unchanged. Aortic valve replacement. Left lower lung zone airspace opacity. No pulmonary edema. Small left pleural effusion. No pneumothorax. No acute osseous abnormality. Sternotomy wires intact. IMPRESSION: Limited evaluation due to overlapping osseous structures and overlying soft tissues. Suggestion of left lower lung zone airspace opacity and small left pleural effusion. Followup PA and lateral chest X-ray is recommended in 3-4 weeks following therapy to ensure resolution. Electronically Signed   By: Morgane  Naveau M.D.   On: 06/28/2023 19:46        Scheduled Meds:  amiodarone    200 mg Oral BID   amLODipine   5 mg Oral Daily   apixaban   5 mg Oral BID   insulin  aspart  0-9 Units Subcutaneous TID WC   metoprolol  tartrate  12.5 mg Oral BID   pantoprazole   40 mg Oral Daily   rosuvastatin   10 mg Oral QODAY   sodium chloride  flush  3 mL Intravenous Q12H   Continuous Infusions:   LOS: 1 day    Time spent: 45 minutes spent on chart review, discussion with nursing staff, consultants, updating family and interview/physical exam; more than 50% of that time was spent in counseling and/or coordination of care.    Harlene RAYMOND Bowl, DO [facility] Available via Epic secure chat 7am-7pm After  these hours, please refer to coverage provider listed on [email or web address] 06/30/2023, 12:35 PM

## 2023-06-30 NOTE — Plan of Care (Signed)
 Nutrition Education Note  RD consulted for nutrition goals assessment.   Spoke with patient and her husband at bedside. Patient endorses eating well but does not provide much additional information. Her husband endorses that patient typically adds extra salt to foods. She enjoys soups as they are light, popcorn and chips as a snack and one of her favorite restaurants to eat out is [employer].    RD provided Low Sodium Nutrition Therapy handout from the Academy of [employer] and Dietetics. Reviewed patient's dietary recall. Provided examples on ways to decrease sodium intake in diet. Discouraged intake of processed foods and use of salt shaker. Encouraged fresh fruits and vegetables as well as whole grain sources of carbohydrates to maximize fiber intake.   RD discussed why it is important for patient to adhere to diet recommendations, and emphasized the role of fluids, foods to avoid, and importance of weighing self daily. Teach back method used.  Expect fair compliance.  Body mass index is 41.55 kg/m. Pt meets criteria for morbid obesity based on current BMI.  Current diet order is heart health/carb modified, no meal completions documented to review at this time. Labs and medications reviewed. No further nutrition interventions warranted at this time. RD contact information provided. If additional nutrition issues arise, please re-consult RD.   Allie Jamarquis Crull, RDN, LDN Clinical Nutrition

## 2023-07-01 ENCOUNTER — Telehealth (HOSPITAL_COMMUNITY): Payer: Self-pay | Admitting: Pharmacy Technician

## 2023-07-01 ENCOUNTER — Other Ambulatory Visit (HOSPITAL_COMMUNITY): Payer: Self-pay

## 2023-07-01 ENCOUNTER — Inpatient Hospital Stay (HOSPITAL_COMMUNITY): Payer: Medicare HMO

## 2023-07-01 DIAGNOSIS — I10 Essential (primary) hypertension: Secondary | ICD-10-CM | POA: Diagnosis not present

## 2023-07-01 DIAGNOSIS — Z952 Presence of prosthetic heart valve: Secondary | ICD-10-CM

## 2023-07-01 DIAGNOSIS — N179 Acute kidney failure, unspecified: Secondary | ICD-10-CM | POA: Diagnosis not present

## 2023-07-01 DIAGNOSIS — R0600 Dyspnea, unspecified: Secondary | ICD-10-CM | POA: Diagnosis not present

## 2023-07-01 DIAGNOSIS — I5033 Acute on chronic diastolic (congestive) heart failure: Secondary | ICD-10-CM | POA: Diagnosis not present

## 2023-07-01 DIAGNOSIS — I130 Hypertensive heart and chronic kidney disease with heart failure and stage 1 through stage 4 chronic kidney disease, or unspecified chronic kidney disease: Secondary | ICD-10-CM | POA: Diagnosis not present

## 2023-07-01 LAB — BASIC METABOLIC PANEL
Anion gap: 11 (ref 5–15)
BUN: 17 mg/dL (ref 8–23)
CO2: 25 mmol/L (ref 22–32)
Calcium: 9.1 mg/dL (ref 8.9–10.3)
Chloride: 102 mmol/L (ref 98–111)
Creatinine, Ser: 1.35 mg/dL — ABNORMAL HIGH (ref 0.44–1.00)
GFR, Estimated: 43 mL/min — ABNORMAL LOW (ref >60)
Glucose, Bld: 103 mg/dL — ABNORMAL HIGH (ref 70–99)
Potassium: 4.3 mmol/L (ref 3.5–5.1)
Sodium: 138 mmol/L (ref 135–145)

## 2023-07-01 LAB — CBC WITH DIFFERENTIAL/PLATELET
Abs Immature Granulocytes: 0.25 10*3/uL — ABNORMAL HIGH (ref 0.00–0.07)
Basophils Absolute: 0 10*3/uL (ref 0.0–0.1)
Basophils Relative: 0 %
Eosinophils Absolute: 0.5 10*3/uL (ref 0.0–0.5)
Eosinophils Relative: 4 %
HCT: 33 % — ABNORMAL LOW (ref 36.0–46.0)
Hemoglobin: 10.5 g/dL — ABNORMAL LOW (ref 12.0–15.0)
Immature Granulocytes: 2 %
Lymphocytes Relative: 23 %
Lymphs Abs: 2.7 10*3/uL (ref 0.7–4.0)
MCH: 25.1 pg — ABNORMAL LOW (ref 26.0–34.0)
MCHC: 31.8 g/dL (ref 30.0–36.0)
MCV: 78.9 fL — ABNORMAL LOW (ref 80.0–100.0)
Monocytes Absolute: 1 10*3/uL (ref 0.1–1.0)
Monocytes Relative: 8 %
Neutro Abs: 7.4 10*3/uL (ref 1.7–7.7)
Neutrophils Relative %: 63 %
Platelets: 530 10*3/uL — ABNORMAL HIGH (ref 150–400)
RBC: 4.18 MIL/uL (ref 3.87–5.11)
RDW: 18.1 % — ABNORMAL HIGH (ref 11.5–15.5)
WBC: 11.7 10*3/uL — ABNORMAL HIGH (ref 4.0–10.5)
nRBC: 0 % (ref 0.0–0.2)

## 2023-07-01 LAB — ECHOCARDIOGRAM COMPLETE
AR max vel: 1.2 cm2
AV Area VTI: 1.36 cm2
AV Area mean vel: 1.28 cm2
AV Mean grad: 11 mm[Hg]
AV Peak grad: 20.1 mm[Hg]
Ao pk vel: 2.24 m/s
Area-P 1/2: 3.42 cm2
MV VTI: 1.67 cm2
S' Lateral: 2.2 cm
Weight: 3012.8 [oz_av]

## 2023-07-01 LAB — GLUCOSE, CAPILLARY: Glucose-Capillary: 92 mg/dL (ref 70–99)

## 2023-07-01 MED ORDER — FUROSEMIDE 40 MG PO TABS: 40.0000 mg | ORAL_TABLET | Freq: Every day | ORAL | 0 refills | Status: DC | PRN

## 2023-07-01 MED ORDER — PERFLUTREN LIPID MICROSPHERE
1.0000 mL | INTRAVENOUS | Status: AC | PRN
  Administered 2023-07-01: 3 mL via INTRAVENOUS

## 2023-07-01 MED ORDER — AMLODIPINE BESYLATE 5 MG PO TABS: 5.0000 mg | ORAL_TABLET | Freq: Once | ORAL | Status: DC

## 2023-07-01 MED ORDER — OXYCODONE HCL 5 MG PO TABS
5.0000 mg | ORAL_TABLET | Freq: Once | ORAL | Status: AC | PRN
  Administered 2023-07-01: 5 mg via ORAL
  Filled 2023-07-01: qty 1

## 2023-07-01 MED ORDER — AMLODIPINE BESYLATE 10 MG PO TABS: 10.0000 mg | ORAL_TABLET | Freq: Every day | ORAL | Status: DC

## 2023-07-01 NOTE — Progress Notes (Signed)
   Patient Name: [name]  TEJA COSTEN Date of Encounter: 07/01/2023 [facility] Cardiologist: Maude Emmer, MD   Interval Summary  .    Feeling well. Breathing is at baseline.  She feels like the fluid is gone.  Eager to go home.  Vital Signs .    Vitals:   06/30/23 2338 07/01/23 0409 07/01/23 0617 07/01/23 0820  BP: (!) 145/69 (!) 145/71  (!) 156/78  Pulse:  89  96  Resp: 20 18 20 20   Temp: 98.7 F (37.1 C) 98.5 F (36.9 C)  98.5 F (36.9 C)  TempSrc: Oral Oral  Oral  SpO2: 98% 97%    Weight:   85.4 kg     Intake/Output Summary (Last 24 hours) at 07/01/2023 0917 Last data filed at 07/01/2023 9176 Gross per 24 hour  Intake 243 ml  Output --  Net 243 ml      07/01/2023    6:17 AM 06/30/2023    3:16 AM 06/24/2023    6:55 AM  Last 3 Weights  Weight (lbs) 188 lb 4.8 oz 192 lb 0.3 oz 193 lb 12.6 oz  Weight (kg) 85.412 kg 87.1 kg 87.9 kg      Telemetry/ECG    Sinus rhythm.  Sinus tachycardia.  PVCs.  - Personally Reviewed  Physical Exam .   GEN: No acute distress.   Neck: No JVD Cardiac: RRR, II/VI systolic murmur at the LUSB, rubs, or gallops.  Respiratory: Clear to auscultation bilaterally. GI: Soft, nontender, non-distended  MS: No edema  Assessment & Plan .     [name] is a 57F with hypertension, HFpEF, CKD 3B, obesity, chronic osteomyelitis and recent surgical aortic valve replacement admitted with shortness of breath.   # Acute on chronic HFpEF: Patient admitted with increasing shortness of breath.  BNP elevated to the 300s on admission.  She has not been weighing herself at home.  No weight has been recorded yet and ins and outs are not recorded after receiving 20 mg of IV Lasix .  She does report feeling better with diuresis.  She feels that her breathing and volume are at baseline.  Echo pending.  Will check on the affordability of an SGLT2 inhibitor.  Recommend that she have Lasix  40 mg to take as needed when upon discharge.   # Status post surgical  aortic valve replacement: Patient underwent valve replacement last month.  Bedside echo was unrevealing.  Will repeat a full echo to better assess her valve.  Of course there is concern given her persistent leukocytosis and chronic osteomyelitis with persistently elevated CRP.  Fortunately CRP is significantly improved this admission.  2.7 down from 30.7 last month.   # Hypertension: At home she is on amlodipine , losartan , and metoprolol . Amlodipine  and losartan  have been held.  BP is very labile but now mostly running high.  Will increase amlodipine  to 10 mg.  She had hyperkalemia on admission.  Her creatinine is still above baseline.  Given her diabetes, would like to be good to have her on an ARB.  Recommend repeating a BMP as an outpatient and resuming lower dose losartan  if blood pressure, potassium, and renal function permit.  # PAF:  Maintaining sinus rhythm.  Continue amiodarone , metoprolol  and Eliquis .   # Hyperlipidemia:  Continue rosuvastatin  and Zetia     For questions or updates, please contact [facility] Please consult [email or web address] for contact info under        Signed, Annabella Scarce, MD

## 2023-07-01 NOTE — Discharge Summary (Signed)
 Physician Discharge Summary  Ausha  [name] FMW:[id number] DOB: [birth year] DOA: 06/28/2023  PCP: [name], PA-C  Admit date: 06/28/2023 Discharge date: 07/01/2023  Admitted From: home Discharge disposition: home   Recommendations for Outpatient Follow-Up:   New med: lasix  PRN Needs ID consult BMP 1 week followup PA and lateral chest X-ray is recommended in 3-4 weeks following    Discharge Diagnosis:   Principal Problem:   Dyspnea Active Problems:   Acute on chronic heart failure with preserved ejection fraction (HCC)   S/P AVR (aortic valve replacement) and aortoplasty   AKI (acute kidney injury) (HCC)   Essential hypertension   Gastroesophageal reflux disease without esophagitis   DM type 2 (diabetes mellitus, type 2) (HCC)   Iron  deficiency anemia   S/P AVR (aortic valve replacement)    Discharge Condition: Improved.  Diet recommendation: Low sodium, heart healthy.  Carbohydrate-modified  Wound care: None.  Code status: Full.   History of Present Illness:   Olivia  MYKELTI [name] is a 68 y.o. 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  Patient found to be in AKI and hypertensive emergency and respiratory failure.  Pt is a limited historian. Pt reports  did not call her pcp, cardiology or CTS and has an appt on the 13th.    Hospital Course by Problem:   Dyspnea 2/2 to volume overload and hypertensive urgency.  -given lasix  x 1 in ER with improvement -plan for PRN lasix  per  cards SGLT2- not affordable   Acute on chronic heart failure with preserved ejection fraction (HFpEF) (HCC) -cards consult -repeat echo pending   S/P AVR (aortic valve replacement) and aortoplasty -outpatient CVTS follow up     AKI (acute kidney injury) (HCC) -continue to await labs     Essential hypertension -BP controlled   Gastroesophageal reflux disease without esophagitis -PPI   DM type 2 (diabetes mellitus, type 2) (HCC) -resume home meds   Iron  deficiency anemia -outpatient follow up      Medical Consultants:   cards   Discharge Exam:   Vitals:   07/01/23 0617 07/01/23 0820  BP:  (!) 156/78  Pulse:  96  Resp: 20 20  Temp:  98.5 F (36.9 C)  SpO2:     Vitals:   06/30/23 2338 07/01/23 0409 07/01/23 0617 07/01/23 0820  BP: (!) 145/69 (!) 145/71  (!) 156/78  Pulse:  89  96  Resp: 20 18 20 20   Temp: 98.7 F (37.1 C) 98.5 F (36.9 C)  98.5 F (36.9 C)  TempSrc: Oral Oral  Oral  SpO2: 98% 97%    Weight:   85.4 kg     General exam: Appears calm and comfortable.    The results of significant diagnostics from this hospitalization (including imaging, microbiology, ancillary and laboratory) are listed below for reference.     Procedures and Diagnostic Studies:   DG Chest 2 View Result Date: 06/28/2023 CLINICAL DATA:  sob EXAM: CHEST - 2 VIEW COMPARISON:  Cxr 06/20/23 FINDINGS: Limited evaluation due to overlapping osseous structures and overlying soft tissues. The heart and mediastinal contours are unchanged. Aortic valve  replacement. Left lower lung zone airspace opacity. No pulmonary edema. Small left pleural effusion. No pneumothorax. No acute osseous abnormality. Sternotomy wires intact. IMPRESSION: Limited evaluation due to overlapping osseous structures and overlying soft tissues. Suggestion of left lower lung zone airspace opacity and small left pleural effusion. Followup PA and lateral chest X-ray is recommended in 3-4 weeks following therapy to  ensure resolution. Electronically Signed   By: Morgane  Naveau M.D.   On: 06/28/2023 19:46     Labs:   Basic Metabolic Panel: Recent Labs  Lab 06/28/23 1853 06/29/23 1549 06/30/23 1256 07/01/23 0305  NA 138 139 139 138  K 5.3* 4.4 4.5 4.3  CL 103 102 101 102  CO2 27 25 25 25   GLUCOSE 90 104* 157* 103*  BUN 28* 21 16 17   CREATININE 1.52* 1.41* 1.41* 1.35*  CALCIUM  9.1 9.1 9.3 9.1   GFR Estimated Creatinine Clearance: 37.1 mL/min (A) (by C-G formula based on SCr of 1.35 mg/dL (H)). Liver Function Tests: No results for input(s): AST, ALT, ALKPHOS, BILITOT, PROT, ALBUMIN  in the last 168 hours. No results for input(s): LIPASE, AMYLASE in the last 168 hours. No results for input(s): AMMONIA in the last 168 hours. Coagulation profile No results for input(s): INR, PROTIME in the last 168 hours.  CBC: Recent Labs  Lab 06/28/23 1853 06/29/23 1549 07/01/23 0305  WBC 18.8* 14.5* 11.7*  NEUTROABS  --   --  7.4  HGB 10.2* 11.0* 10.5*  HCT 33.1* 35.2* 33.0*  MCV 80.1 79.6* 78.9*  PLT 650* 646* 530*   Cardiac Enzymes: No results for input(s): CKTOTAL, CKMB, CKMBINDEX, TROPONINI in the last 168 hours. BNP: Invalid input(s): POCBNP CBG: Recent Labs  Lab 06/30/23 0603 06/30/23 1141 06/30/23 1649 06/30/23 2129 07/01/23 0616  GLUCAP 99 92 85 106* 92   D-Dimer No results for input(s): DDIMER in the last 72 hours. Hgb A1c No results for input(s): HGBA1C in the last 72 hours. Lipid Profile No results for input(s): CHOL, HDL, LDLCALC, TRIG, CHOLHDL, LDLDIRECT in the last 72 hours. Thyroid function studies No results for input(s): TSH, T4TOTAL, T3FREE, THYROIDAB in the last 72 hours.  Invalid input(s): FREET3 Anemia work up No results for input(s): VITAMINB12, FOLATE, FERRITIN, TIBC, IRON , RETICCTPCT in the last 72 hours. Microbiology Recent Results (from the past 240 hours)  Resp panel by RT-PCR (RSV,  Flu A&B, Covid) Anterior Nasal Swab     Status: None   Collection Time: 06/28/23 10:56 PM   Specimen: Anterior Nasal Swab  Result Value Ref Range Status   SARS Coronavirus 2 by RT PCR NEGATIVE NEGATIVE Final    Comment: (NOTE) SARS-CoV-2 target nucleic acids are NOT DETECTED.  The SARS-CoV-2 RNA is generally detectable in upper respiratory specimens during the acute phase of infection. The lowest concentration of SARS-CoV-2 viral copies this assay can detect is 138 copies/mL. A negative result does not preclude SARS-Cov-2 infection and should not be used as the sole basis for treatment or other patient management decisions. A negative result may occur with  improper specimen collection/handling, submission of specimen other than nasopharyngeal swab, presence of viral mutation(s) within the areas targeted by this assay, and inadequate number of viral copies(<138 copies/mL). A negative result must be combined with clinical observations, patient history, and epidemiological information. The expected result is Negative.  Fact Sheet for Patients:  bloggercourse.com  Fact Sheet for Healthcare Providers:  seriousbroker.it  This test is no t yet approved or cleared by the [place]  FDA and  has  been authorized for detection and/or diagnosis of SARS-CoV-2 by FDA under an Emergency Use Authorization (EUA). This EUA will remain  in effect (meaning this test can be used) for the duration of the COVID-19 declaration under Section 564(b)(1) of the Act, 21 U.S.C.section 360bbb-3(b)(1), unless the authorization is terminated  or revoked sooner.       Influenza A by PCR NEGATIVE NEGATIVE Final   Influenza B by PCR NEGATIVE NEGATIVE Final    Comment: (NOTE) The Xpert Xpress SARS-CoV-2/FLU/RSV plus assay is intended as an aid in the diagnosis of influenza from Nasopharyngeal swab specimens and should not be used as a sole basis for  treatment. Nasal washings and aspirates are unacceptable for Xpert Xpress SARS-CoV-2/FLU/RSV testing.  Fact Sheet for Patients: bloggercourse.com  Fact Sheet for Healthcare Providers: seriousbroker.it  This test is not yet approved or cleared by the [place]  FDA and has been authorized for detection and/or diagnosis of SARS-CoV-2 by FDA under an Emergency Use Authorization (EUA). This EUA will remain in effect (meaning this test can be used) for the duration of the COVID-19 declaration under Section 564(b)(1) of the Act, 21 U.S.C. section 360bbb-3(b)(1), unless the authorization is terminated or revoked.     Resp Syncytial Virus by PCR NEGATIVE NEGATIVE Final    Comment: (NOTE) Fact Sheet for Patients: bloggercourse.com  Fact Sheet for Healthcare Providers: seriousbroker.it  This test is not yet approved or cleared by the [place]  FDA and has been authorized for detection and/or diagnosis of SARS-CoV-2 by FDA under an Emergency Use Authorization (EUA). This EUA will remain in effect (meaning this test can be used) for the duration of the COVID-19 declaration under Section 564(b)(1) of the Act, 21 U.S.C. section 360bbb-3(b)(1), unless the authorization is terminated or revoked.  Performed at [facility], [address], [place], [place] [address]   Culture, blood (Routine X 2) w Reflex to ID Panel     Status: None (Preliminary result)   Collection Time: 06/29/23  3:47 PM   Specimen: BLOOD LEFT HAND  Result Value Ref Range Status   Specimen Description BLOOD LEFT HAND  Final   Special Requests   Final    BOTTLES DRAWN AEROBIC AND ANAEROBIC Blood Culture results may not be optimal due to an inadequate volume of blood received in culture bottles   Culture   Final    NO GROWTH 2 DAYS Performed at [facility], [address],  [place], [place] [address]    Report Status PENDING  Incomplete  Culture, blood (Routine X 2) w Reflex to ID Panel     Status: None (Preliminary result)   Collection Time: 06/29/23  3:49 PM   Specimen: BLOOD RIGHT ARM  Result Value Ref Range Status   Specimen Description BLOOD RIGHT ARM  Final   Special Requests   Final    BOTTLES DRAWN AEROBIC ONLY Blood Culture results may not be optimal due to an inadequate volume of blood received in culture bottles   Culture   Final    NO GROWTH 2 DAYS Performed at [facility], [address], [place], [place] [address]    Report Status PENDING  Incomplete     Discharge Instructions:   Discharge Instructions     Diet - low sodium heart healthy   Complete by: As directed    Diet Carb Modified   Complete by: As directed    Increase activity slowly   Complete by: As directed  Allergies as of 07/01/2023       Reactions   Crestor  [rosuvastatin ] Other (See Comments)   Myalgia; patient can only tolerate taking 10 mg every other day   Robaxin  [methocarbamol ] Other (See Comments)   Insomnia   Toradol  [ketorolac  Tromethamine ] Other (See Comments)   Zocor [simvastatin] Other (See Comments)   Myalgias    Lipitor [atorvastatin ] Other (See Comments)   Myalgia   Sulfa  Antibiotics Hives        Medication List     PAUSE taking these medications    metFORMIN  1000 MG tablet Wait to take this until your doctor or other care provider tells you to start again. Commonly known as: GLUCOPHAGE  Take 1 tablet (1,000 mg total) by mouth 2 (two) times daily with a meal.       STOP taking these medications    predniSONE  20 MG tablet Commonly known as: DELTASONE        TAKE these medications    acetaminophen  500 MG tablet Commonly known as: TYLENOL  Take 1,000 mg by mouth every 6 (six) hours as needed for mild pain (pain score 1-3) or headache.   albuterol  108 (90 Base) MCG/ACT inhaler Commonly known as: ProAir  HFA Inhale 2 puffs  into the lungs every 6 (six) hours as needed.   amiodarone  200 MG tablet Commonly known as: PACERONE  Take 1 tablet (200 mg total) by mouth 2 (two) times daily.   amLODipine  10 MG tablet Commonly known as: NORVASC  Take 1 tablet (10 mg total) by mouth daily. What changed: when to take this   amoxicillin -clavulanate 875-125 MG tablet Commonly known as: AUGMENTIN  Take 1 tablet by mouth every 12 (twelve) hours.   ascorbic acid 500 MG tablet Commonly known as: VITAMIN C Take 500 mg by mouth daily.   aspirin  81 MG chewable tablet Chew 1 tablet (81 mg total) by mouth daily.   cetirizine  10 MG tablet Commonly known as: ZYRTEC  Take 1 tablet (10 mg total) by mouth daily.   docusate sodium  100 MG capsule Commonly known as: COLACE Take 200-300 mg by mouth at bedtime.   Eliquis  5 MG Tabs tablet Generic drug: apixaban  Take 1 tablet (5 mg total) by mouth 2 (two) times daily.   ezetimibe  10 MG tablet Commonly known as: ZETIA  Take 1 tablet (10 mg total) by mouth daily.   Ferrex 150 150 MG capsule Generic drug: iron  polysaccharides TAKE 1 CAPSULE (150 MG DOSE) BY MOUTH TWICE A DAY What changed:  how much to take how to take this when to take this   fluticasone  50 MCG/ACT nasal spray Commonly known as: FLONASE  Place 2 sprays into both nostrils daily. What changed:  when to take this reasons to take this   furosemide  40 MG tablet Commonly known as: Lasix  Take 1 tablet (40 mg total) by mouth daily as needed for edema or fluid (shortness of breath).   gabapentin  600 MG tablet Commonly known as: NEURONTIN  Take 600 mg by mouth 3 (three) times daily.   losartan  100 MG tablet Commonly known as: COZAAR  Take 1 tablet (100 mg total) by mouth daily.   metoprolol  tartrate 25 MG tablet Commonly known as: LOPRESSOR  Take 0.5 tablets (12.5 mg total) by mouth 2 (two) times daily.   ondansetron  8 MG tablet Commonly known as: ZOFRAN  Take 8 mg by mouth 3 (three) times daily.    oxyCODONE  5 MG immediate release tablet Commonly known as: Oxy IR/ROXICODONE  Take 1 tablet (5 mg total) by mouth every 6 (six) hours as needed  for up to 7 days for severe pain (pain score 7-10).   Ozempic  (2 MG/DOSE) 8 MG/3ML Sopn Generic drug: Semaglutide  (2 MG/DOSE) Inject 2 mg into the skin once a week.   pantoprazole  40 MG tablet Commonly known as: PROTONIX  Take 40 mg by mouth daily.   polyvinyl alcohol 1.4 % ophthalmic solution Commonly known as: LIQUIFILM TEARS Place 1 drop into both eyes as needed for dry eyes.   rosuvastatin  10 MG tablet Commonly known as: CRESTOR  Take 1 tablet (10 mg total) by mouth every other day.   sulfamethoxazole -trimethoprim  400-80 MG tablet Commonly known as: BACTRIM  TAKE 1 TABLET BY MOUTH TWICE DAILY        Follow-up Information     Shepperson, Kirstin, PA-C Follow up.   Specialty: Family Medicine Contact information: [address] [place] [address] [phone]         [place] Constance DASEN, MD. Schedule an appointment as soon as possible for a visit in 1 week(s).   Specialty: Infectious Diseases Contact information: [address] [place] [address] [phone]                  Time coordinating discharge: 45 min  Signed:  Harlene RAYMOND Bowl DO  [facility] 07/01/2023, 11:57 AM

## 2023-07-01 NOTE — Telephone Encounter (Signed)
 Pharmacy Patient Advocate Encounter  Insurance verification completed.    The patient is insured through [employer]. Patient has Medicare and is not eligible for a copay card, but may be able to apply for patient assistance or Medicare RX Payment Plan (Patient Must reach out to their plan, if eligible for payment plan), if available.    Ran test claim for JARDIANCE 10MG  and the current 30 day co-pay is $293.90.  Ran test claim for FARXIGA 10MG  and the current 30 day co-pay is $389.61.  COPAY APPLIED TO $250.00 DEDUCTIBLE.  This test claim was processed through [facility]- copay amounts may vary at other pharmacies due to pharmacy/plan contracts, or as the patient moves through the different stages of their insurance plan.

## 2023-07-01 NOTE — Progress Notes (Signed)
 Patient discharged from unit  medications and property returned to designated person. Discharge instructions reviewed and patient questions answered.

## 2023-07-01 NOTE — TOC Transition Note (Signed)
 Transition of Care (TOC) - Discharge Note Rayfield Gobble RN, BSN Transitions of Care Unit 4E- RN Case Manager See Treatment Team for direct phone #   Patient Details  Name: [name]  LATARIA COURSER MRN: [id number] Date of Birth: 1955-07-15  Transition [facility]) CM/SW Contact:  Gobble Rayfield Hurst, RN Phone Number: 07/01/2023, 1:06 PM   Clinical Narrative:    Pt stable for transition home today, Pt active with [facility] for RN/PT from last admit.  CM spoke with pt at bedside and confirmed pt would like to continue [facility] services with [facility].  Resumption orders placed and CM contacted [facility] liaison for resumption of care.   Spouse at bedside to transport home, pt has needed DME.   No further TOC needs noted.    Final next level of care: Home w Home Health Services Barriers to Discharge: No Barriers Identified   Patient Goals and CMS Choice Patient states their goals for this hospitalization and ongoing recovery are:: continue to recover CMS Medicare.gov Compare Post Acute Care list provided to:: Patient Choice offered to / list presented to : Patient      Discharge Placement               Home w/ [facility]        Discharge Plan and Services Additional resources added to the After Visit Summary for     Discharge Planning Services: CM Consult Post Acute Care Choice: Home Health, Resumption of Svcs/PTA Provider          DME Arranged: N/A DME Agency: NA       HH Arranged: RN, PT HH Agency: [facility] Date HH Agency Contacted: 07/01/23 Time HH Agency Contacted: 1210 Representative spoke with at [facility] Agency: [facility]  Social Drivers of Health (SDOH) Interventions SDOH Screenings   Food Insecurity: No Food Insecurity (06/30/2023)  Housing: Low Risk  (06/30/2023)  Transportation Needs: No Transportation Needs (06/30/2023)  Utilities: Not At Risk (06/30/2023)  Depression (PHQ2-9): Low Risk  (04/26/2023)  Financial Resource Strain: Low Risk  (04/13/2023)   Received  from [facility]  Physical Activity: Inactive (04/13/2023)   Received from [facility]  Social Connections: Unknown (06/30/2023)  Recent Concern: Social Connections - Somewhat Isolated (04/13/2023)   Received from [facility]  Stress: No Stress Concern Present (04/13/2023)   Received from [facility]  Tobacco Use: Low Risk  (06/29/2023)     Readmission Risk Interventions    07/01/2023    1:05 PM 06/24/2023   10:10 AM  Readmission Risk Prevention Plan  Transportation Screening Complete Complete  Home Care Screening Complete Complete  Medication Review (RN CM) Complete Complete

## 2023-07-01 NOTE — Progress Notes (Signed)
 Mobility Specialist Progress Note:    07/01/23 1005  Mobility  Activity Ambulated with assistance in hallway  Level of Assistance Standby assist, set-up cues, supervision of patient - no hands on  Assistive Device Front wheel walker  Distance Ambulated (ft) 540 ft  Activity Response Tolerated well  Mobility Referral Yes  Mobility visit 1 Mobility  Mobility Specialist Start Time (ACUTE ONLY) 1005  Mobility Specialist Stop Time (ACUTE ONLY) 1015  Mobility Specialist Time Calculation (min) (ACUTE ONLY) 10 min   Pt received in bed, eager for mobility session. Husband at bedside. Ambulated in hallway with RW and SV. Tolerated well, HR 106 bpm during mobility. Returned pt back to room, lying in bed. Left with all needs met, pt eager for d/c.    Deveney Bayon Mobility Specialist Please contact via SecureChat or  Rehab office at [phone]

## 2023-07-04 ENCOUNTER — Ambulatory Visit: Payer: Medicare HMO | Admitting: Physician Assistant

## 2023-07-05 LAB — CULTURE, BLOOD (ROUTINE X 2)
Culture: NO GROWTH
Culture: NO GROWTH

## 2023-07-06 ENCOUNTER — Other Ambulatory Visit: Payer: Self-pay | Admitting: Thoracic Surgery (Cardiothoracic Vascular Surgery)

## 2023-07-06 DIAGNOSIS — I350 Nonrheumatic aortic (valve) stenosis: Secondary | ICD-10-CM

## 2023-07-07 ENCOUNTER — Ambulatory Visit (INDEPENDENT_AMBULATORY_CARE_PROVIDER_SITE_OTHER): Payer: Self-pay

## 2023-07-07 ENCOUNTER — Ambulatory Visit
Admission: RE | Admit: 2023-07-07 | Discharge: 2023-07-07 | Disposition: A | Discharge status: Home / Self Care | Payer: Medicare HMO | Source: Ambulatory Visit | Attending: Thoracic Surgery (Cardiothoracic Vascular Surgery) | Admitting: Thoracic Surgery (Cardiothoracic Vascular Surgery)

## 2023-07-07 VITALS — BP 150/72 | HR 94 | Resp 20 | Wt 197.0 lb

## 2023-07-07 DIAGNOSIS — I350 Nonrheumatic aortic (valve) stenosis: Secondary | ICD-10-CM

## 2023-07-07 DIAGNOSIS — Z952 Presence of prosthetic heart valve: Secondary | ICD-10-CM

## 2023-07-12 NOTE — Progress Notes (Unsigned)
 Cardiology Office Note:    Date:  07/15/2023   ID:  [name], [name] [birth year], MRN [id number]  PCP:  Julien Girt, PA-C   [facility] Providers Cardiologist:  Charlton Haws, MD Cardiology APP:  Marcelino Duster, PA     Referring MD: [name], MD   Chief Complaint  Patient presents with   Follow-up   Shortness of Breath    History of Present Illness:    [name] is a 68 y.o. 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  She has had multiple back surgeries with hardware infection on chronic suppressive antibiotic.  Hardware was removed and reimplanted.  This has significantly limited her mobility.  Due to aortic stenosis, she was evaluated for SAVR versus TAVR.  Heart catheterization as part of this workup showed no significant CAD.  Unfortunately due to valve size, she was not felt to be a candidate for TAVR and underwent SAVR (Bentall procedure) in 06/14/2023.  EP was consulted for complete heart block, conduction improved and PPM was not implanted.  Unfortunately she developed a febrile illness with positive cultures for Enterococcus faecalis.  She was treated with Augmentin.  Postop course also complicated by atrial flutter resulting in IV amiodarone treatment and anticoagulation with Eliquis.  She was discharged on 06/24/2023 with 200 mg amiodarone p.o. taper, amlodipine, aspirin, Eliquis 5 mg twice daily, Lopressor 2.5 mg twice daily, 10 mg Crestor and Zetia.  She did not require diuretic regimen at discharge.  Unfortunately she presented back to the [facility] ER 2//2025 with shortness of breath and have AKI and hyperkalemia with hypertensive urgency.  Bedside US not consistent with tamponade.  Given recent surgery, she was transferred to [facility] for further  workup.  After her discharge following surgery she self treated for presumed gout flare with prednisone, this coincided with onset of shortness of breath while sitting in the chair.  Formal echocardiogram was repeated with an LVEF of 70-75%, grade 1 DD, mild LVH, trivial MR, and bioprosthetic aortic valve with a mean gradient of 11 mmHg with no significant perivalvular leakage.  No pericardial effusion was noted.  She was treated for acute on chronic diastolic heart failure, BNP was in the 300s on admission.  She felt better with diuresis following 20 mg IV Lasix.  She was discharged with as needed Lasix 40 mg at home.  She continued to maintain sinus rhythm during her second hospitalization, amiodarone, metoprolol and Eliquis were continued.  She presents today for routine cardiology follow-up. Overall she feels well. She has not required PRN lasix. She is still taking 100 mg losartan and 25 mg lopressor (not 12.5 mg BID) - I will make these changes along with 200 mg amiodarone daily (instead of BID).   Past Medical History:  Diagnosis Date   Allergy    Anemia    Anxiety    Arthritis    Asthma    BMI 40.0-44.9, adult (HCC) 04/07/2014   DM type 2 (diabetes mellitus, type 2) (HCC)    GERD (gastroesophageal reflux disease)    HTN (hypertension)    Hypercholesterolemia    Osteoarthritis of left hip 04/07/2014   Reflux    Severe aortic stenosis    Spinal headache    with C-Section and with spinal fusion in 2019    Past Surgical History:  Procedure  Laterality Date   ASCENDING AORTIC ROOT REPLACEMENT N/A 06/14/2023   Procedure: ASCENDING AORTIC ROOT REPLACEMENT USING KONECT RESILIA AORTIC VALVE CONDUIT SIZE AND REATTACHMENT OF RIGHT AND LEFT CORONARY;  Surgeon: Eugenio Hoes, MD;  Location: [facility] OR;  Service: Open Heart Surgery;  Laterality: N/A;   BACK SURGERY  2023   lumbar fusion, got infection, then had to re-do fusion   CESAREAN SECTION     x3   COLONOSCOPY     LAMINECTOMY   03/2021   LEFT HEART CATH AND CORONARY ANGIOGRAPHY N/A 04/06/2023   Procedure: LEFT HEART CATH AND CORONARY ANGIOGRAPHY;  Surgeon: Tonny Bollman, MD;  Location: [facility] INVASIVE CV LAB;  Service: Cardiovascular;  Laterality: N/A;   SPINAL FUSION  2019   TEE WITHOUT CARDIOVERSION N/A 06/14/2023   Procedure: TRANSESOPHAGEAL ECHOCARDIOGRAM (TEE);  Surgeon: Eugenio Hoes, MD;  Location: [facility] OR;  Service: Open Heart Surgery;  Laterality: N/A;   TUBAL LIGATION      Current Medications: Current Meds  Medication Sig   acetaminophen (TYLENOL) 500 MG tablet Take 1,000 mg by mouth every 6 (six) hours as needed for mild pain (pain score 1-3) or headache.   albuterol (PROAIR HFA) 108 (90 BASE) MCG/ACT inhaler Inhale 2 puffs into the lungs every 6 (six) hours as needed.   amiodarone (PACERONE) 200 MG tablet Take 1 tablet (200 mg total) by mouth daily.   amLODipine (NORVASC) 10 MG tablet Take 1 tablet (10 mg total) by mouth daily. (Patient taking differently: Take 10 mg by mouth at bedtime.)   apixaban (ELIQUIS) 5 MG TABS tablet Take 1 tablet (5 mg total) by mouth 2 (two) times daily.   ascorbic acid (VITAMIN C) 500 MG tablet Take 500 mg by mouth daily.   aspirin EC 81 MG tablet Take 1 tablet (81 mg total) by mouth daily. Swallow whole.   cetirizine (ZYRTEC) 10 MG tablet Take 1 tablet (10 mg total) by mouth daily.   docusate sodium (COLACE) 100 MG capsule Take 200-300 mg by mouth at bedtime.   ezetimibe (ZETIA) 10 MG tablet Take 1 tablet (10 mg total) by mouth daily.   fluticasone (FLONASE) 50 MCG/ACT nasal spray Place 2 sprays into both nostrils daily. (Patient taking differently: Place 2 sprays into both nostrils daily as needed for rhinitis.)   furosemide (LASIX) 40 MG tablet Take 1 tablet (40 mg total) by mouth daily as needed for edema or fluid (shortness of breath).   gabapentin (NEURONTIN) 600 MG tablet Take 600 mg by mouth 3 (three) times daily.   losartan (COZAAR) 100 MG tablet Take 1 tablet (100 mg  total) by mouth daily.   [Paused] metFORMIN (GLUCOPHAGE) 1000 MG tablet Take 1 tablet (1,000 mg total) by mouth 2 (two) times daily with a meal.   metoprolol tartrate (LOPRESSOR) 25 MG tablet Take 1 tablet (25 mg total) by mouth 2 (two) times daily.   ondansetron (ZOFRAN) 8 MG tablet Take 8 mg by mouth 3 (three) times daily.   pantoprazole (PROTONIX) 40 MG tablet Take 40 mg by mouth daily.   polyvinyl alcohol (LIQUIFILM TEARS) 1.4 % ophthalmic solution Place 1 drop into both eyes as needed for dry eyes.   rosuvastatin (CRESTOR) 10 MG tablet Take 1 tablet (10 mg total) by mouth every other day.   Semaglutide, 2 MG/DOSE, (OZEMPIC, 2 MG/DOSE,) 8 MG/3ML SOPN Inject 2 mg into the skin once a week.   sulfamethoxazole-trimethoprim (BACTRIM) 400-80 MG tablet TAKE 1 TABLET BY MOUTH TWICE DAILY   [DISCONTINUED] amiodarone (  PACERONE) 200 MG tablet Take 1 tablet (200 mg total) by mouth 2 (two) times daily. (Patient taking differently: Take 200 mg by mouth daily.)   [DISCONTINUED] aspirin 81 MG chewable tablet Chew 1 tablet (81 mg total) by mouth daily.   [DISCONTINUED] metoprolol tartrate (LOPRESSOR) 25 MG tablet Take 0.5 tablets (12.5 mg total) by mouth 2 (two) times daily. (Patient taking differently: Take 25 mg by mouth 2 (two) times daily.)     Allergies:   Crestor [rosuvastatin], Robaxin [methocarbamol], Toradol [ketorolac tromethamine], Zocor [simvastatin], Lipitor [atorvastatin], and Sulfa antibiotics   Social History   Socioeconomic History   Marital status: Married    Spouse name: Not on file   Number of children: 3   Years of education: Not on file   Highest education level: Not on file  Occupational History   Not on file  Tobacco Use   Smoking status: Never   Smokeless tobacco: Never  Vaping Use   Vaping status: Never Used  Substance and Sexual Activity   Alcohol use: No    Alcohol/week: 0.0 standard drinks of alcohol   Drug use: No   Sexual activity: Yes  Other Topics Concern    Not on file  Social History Narrative   Not on file   Social Drivers of Health   Financial Resource Strain: Low Risk  (04/13/2023)   Received from [facility]   Overall Financial Resource Strain (CARDIA)    Difficulty of Paying Living Expenses: Not hard at all  Food Insecurity: No Food Insecurity (06/30/2023)   Hunger Vital Sign    Worried About Running Out of Food in the Last Year: Never true    Ran Out of Food in the Last Year: Never true  Transportation Needs: No Transportation Needs (06/30/2023)   PRAPARE - Administrator, Civil Service (Medical): No    Lack of Transportation (Non-Medical): No  Physical Activity: Inactive (04/13/2023)   Received from [facility]   Exercise Vital Sign    Days of Exercise per Week: 0 days    Minutes of Exercise per Session: 30 min  Stress: No Stress Concern Present (04/13/2023)   Received from [facility] - Occupational Stress Questionnaire    Feeling of Stress : Only a little  Social Connections: Unknown (06/30/2023)   Social Connection and Isolation Panel [NHANES]    Frequency of Communication with Friends and Family: Once a week    Frequency of Social Gatherings with Friends and Family: Once a week    Attends Religious Services: Patient declined    Database administrator or Organizations: Patient declined    Attends Banker Meetings: Patient declined    Marital Status: Married  Recent Concern: Social Connections - Somewhat Isolated (04/13/2023)   Received from [employer]   Social Network    How would you rate your social network (family, work, friends)?: Restricted participation with some degree of social isolation     Family History: The patient's family history includes Heart disease in her father; Hypertension in her mother; Multiple sclerosis in her brother and sister; Stroke in her brother and mother. There is no history of Colon cancer, Colon polyps, Esophageal  cancer, Rectal cancer, or Stomach cancer.  ROS:   Please see the history of present illness.     All other systems reviewed and are negative.  EKGs/Labs/Other Studies Reviewed:    The following studies were reviewed today:  Echo 07/01/23: 1. Left  ventricular ejection fraction, by estimation, is 70 to 75%. The  left ventricle has hyperdynamic function. The left ventricle has no  regional wall motion abnormalities. There is mild concentric left  ventricular hypertrophy. Left ventricular  diastolic parameters are consistent with Grade I diastolic dysfunction  (impaired relaxation). The average left ventricular global longitudinal  strain is -11.5 %. The global longitudinal strain is abnormal.   2. Right ventricular systolic function is normal. The right ventricular  size is normal. Tricuspid regurgitation signal is inadequate for assessing  PA pressure.   3. The mitral valve is normal in structure. Trivial mitral valve  regurgitation. No evidence of mitral stenosis.   4. Bioprosthetic aortic valve with mean gradient 11 mmHg. No significant  perivalvular leakage.   5. The inferior vena cava is normal in size with greater than 50%  respiratory variability, suggesting right atrial pressure of 3 mmHg.   6. Left pleural effusion noted.        Recent Labs: 04/27/2023: NT-Pro BNP 315 04/28/2023: TSH 4.110 06/14/2023: ALT 21 06/24/2023: Magnesium 2.0 06/28/2023: B Natriuretic Peptide 356.0 07/01/2023: BUN 17; Creatinine, Ser 1.35; Hemoglobin 10.5; Platelets 530; Potassium 4.3; Sodium 138  Recent Lipid Panel    Component Value Date/Time   CHOL 153 02/03/2023 0847   TRIG 135 02/03/2023 0847   HDL 43 02/03/2023 0847   CHOLHDL 3.6 02/03/2023 0847   CHOLHDL 3.2 11/12/2015 1436   VLDL 28 11/12/2015 1436   LDLCALC 86 02/03/2023 0847   LDLDIRECT 106 (H) 04/27/2023 1114     Risk Assessment/Calculations:                Physical Exam:    VS:  BP 128/78 (BP Location: Left Arm, Patient  Position: Sitting, Cuff Size: Large)   Pulse 88   Ht 4\' 9"  (1.448 m)   Wt 192 lb (87.1 kg)   BMI 41.55 kg/m     Wt Readings from Last 3 Encounters:  07/15/23 192 lb (87.1 kg)  07/07/23 197 lb (89.4 kg)  07/01/23 188 lb 4.8 oz (85.4 kg)     GEN:  Well nourished, well developed in no acute distress HEENT: Normal NECK: No JVD; No carotid bruits LYMPHATICS: No lymphadenopathy CARDIAC: RRR, no murmurs, rubs, gallops RESPIRATORY:  Clear to auscultation without rales, wheezing or rhonchi  ABDOMEN: Soft, non-tender, non-distended MUSCULOSKELETAL:  mild B LE edema; No deformity  SKIN: Warm and dry NEUROLOGIC:  Alert and oriented x 3 PSYCHIATRIC:  Normal affect   ASSESSMENT:    1. Primary hypertension   2. Severe aortic stenosis   3. S/P AVR (aortic valve replacement) and aortoplasty   4. 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 Chronic osteomyelitis of lumbar spine (HCC)   6. PAF (paroxysmal atrial fibrillation) (HCC)   7. Chronic anticoagulation   8. Hyperlipidemia with target LDL less than 70    PLAN:    In order of problems listed above:  Severe aortic stenosis s/p SAVR - Repeat echocardiogram for onset of shortness of breath with no perivalvular leak - Will continue with PRN lasix 40 mg  -- mild dependent edema -- SOB improved, no use of lasix since admission -- will continue with scheduled echo in March -- given her chronic bactrim, I will confirm with pharmD her SBE PPX regimen  Confirmed SBE PPX: Would recommend amoxicillin 2 grams once before procedure    Chronic diastolic heart failure - SGLT2i was not affordable - 40 mg PRN lasix   PAF  Chronic anticoagulation - continue 200 mg amiodarone, 12.5 mg lopressor BID, - reduced amiodarone to 200 mg daily   HTN - 10 mg amlodipine, has resumed 100 mg losartan already - hyperkalemia during last admission - ARB held at last discharge - will repeat BMP today   Hyperlipidemia with LDL goal <  70 02/03/2023: Cholesterol, Total 153; HDL 43; LDL Chol Calc (NIH) 86; Triglycerides 135 Continue crestor and zetia    Follow up in 3-4 months.     Cardiac Rehabilitation Eligibility Assessment  The patient is ready to start cardiac rehabilitation pending clearance from the cardiac surgeon.          Medication Adjustments/Labs and Tests Ordered: Current medicines are reviewed at length with the patient today.  Concerns regarding medicines are outlined above.  Orders Placed This Encounter  Procedures   Basic metabolic panel   Meds ordered this encounter  Medications   amiodarone (PACERONE) 200 MG tablet    Sig: Take 1 tablet (200 mg total) by mouth daily.    Dispense:  90 tablet    Refill:  3    New script for doseage change.   aspirin EC 81 MG tablet    Sig: Take 1 tablet (81 mg total) by mouth daily. Swallow whole.   metoprolol tartrate (LOPRESSOR) 25 MG tablet    Sig: Take 1 tablet (25 mg total) by mouth 2 (two) times daily.    Dispense:  180 tablet    Refill:  3    Dose change.    Patient Instructions  Medication Instructions:  New scripts sent for Metoprolol tartrate 25 mg by mouth 2 times per day, amiodarone 200 mg by mouth once daily, change to [facility] Aspirin 81 mg by mouth daily may use over the counter brand. *If you need a refill on your cardiac medications before your next appointment, please call your pharmacy*   Lab Work: BMET today. If you have labs (blood work) drawn today and your tests are completely normal, you will receive your results only by: MyChart Message (if you have MyChart) OR A paper copy in the mail If you have any lab test that is abnormal or we need to change your treatment, we will call you to review the results.    Follow-Up: At [facility], you and your health needs are our priority.  As part of our continuing mission to provide you with exceptional heart care, we have created designated Provider Care Teams.  These Care  Teams include your primary Cardiologist (physician) and Advanced Practice Providers (APPs -  Physician Assistants and Nurse Practitioners) who all work together to provide you with the care you need, when you need it.  We recommend signing up for the patient portal called "MyChart".  Sign up information is provided on this After Visit Summary.  MyChart is used to connect with patients for Virtual Visits (Telemedicine).  Patients are able to view lab/test results, encounter notes, upcoming appointments, etc.  Non-urgent messages can be sent to your provider as well.   To learn more about what you can do with MyChart, go to [email or web address].    Your next appointment:   4 month(s)  Provider:   Charlton Haws, MD     Other Instructions         Signed, Marcelino Duster, [place]  07/15/2023 10:20 AM    [facility]

## 2023-07-15 ENCOUNTER — Ambulatory Visit: Payer: Medicare HMO | Attending: Physician Assistant | Admitting: Physician Assistant

## 2023-07-15 ENCOUNTER — Encounter: Payer: Self-pay | Admitting: Physician Assistant

## 2023-07-15 VITALS — BP 128/78 | HR 88 | Ht <= 58 in | Wt 192.0 lb

## 2023-07-15 DIAGNOSIS — I480 Paroxysmal atrial fibrillation: Secondary | ICD-10-CM

## 2023-07-15 DIAGNOSIS — Z952 Presence of prosthetic heart valve: Secondary | ICD-10-CM | POA: Diagnosis not present

## 2023-07-15 DIAGNOSIS — T84216D Breakdown (mechanical) of internal fixation device of vertebrae, subsequent encounter: Secondary | ICD-10-CM | POA: Diagnosis not present

## 2023-07-15 DIAGNOSIS — E785 Hyperlipidemia, unspecified: Secondary | ICD-10-CM

## 2023-07-15 DIAGNOSIS — I10 Essential (primary) hypertension: Secondary | ICD-10-CM | POA: Diagnosis not present

## 2023-07-15 DIAGNOSIS — M8668 Other chronic osteomyelitis, other site: Secondary | ICD-10-CM

## 2023-07-15 DIAGNOSIS — I350 Nonrheumatic aortic (valve) stenosis: Secondary | ICD-10-CM | POA: Diagnosis not present

## 2023-07-15 DIAGNOSIS — T84216A Breakdown (mechanical) of internal fixation device of vertebrae, initial encounter: Secondary | ICD-10-CM

## 2023-07-15 DIAGNOSIS — Z7901 Long term (current) use of anticoagulants: Secondary | ICD-10-CM

## 2023-07-15 LAB — BASIC METABOLIC PANEL
BUN/Creatinine Ratio: 10 — ABNORMAL LOW (ref 12–28)
BUN: 13 mg/dL (ref 8–27)
CO2: 23 mmol/L (ref 20–29)
Calcium: 9.7 mg/dL (ref 8.7–10.3)
Chloride: 106 mmol/L (ref 96–106)
Creatinine, Ser: 1.29 mg/dL — ABNORMAL HIGH (ref 0.57–1.00)
Glucose: 87 mg/dL (ref 70–99)
Potassium: 4.3 mmol/L (ref 3.5–5.2)
Sodium: 145 mmol/L — ABNORMAL HIGH (ref 134–144)
eGFR: 46 mL/min/{1.73_m2} — ABNORMAL LOW (ref >59)

## 2023-07-15 MED ORDER — AMIODARONE HCL 200 MG PO TABS: 200.0000 mg | ORAL_TABLET | Freq: Every day | ORAL | 3 refills | Status: DC

## 2023-07-15 MED ORDER — METOPROLOL TARTRATE 25 MG PO TABS: 25.0000 mg | ORAL_TABLET | Freq: Two times a day (BID) | ORAL | 3 refills | Status: DC

## 2023-07-15 MED ORDER — ASPIRIN 81 MG PO TBEC: 81.0000 mg | DELAYED_RELEASE_TABLET | Freq: Every day | ORAL | Status: AC

## 2023-07-15 NOTE — Patient Instructions (Signed)
 Medication Instructions:  New scripts sent for Metoprolol tartrate 25 mg by mouth 2 times per day, amiodarone 200 mg by mouth once daily, change to [facility] Aspirin 81 mg by mouth daily may use over the counter brand. *If you need a refill on your cardiac medications before your next appointment, please call your pharmacy*   Lab Work: BMET today. If you have labs (blood work) drawn today and your tests are completely normal, you will receive your results only by: MyChart Message (if you have MyChart) OR A paper copy in the mail If you have any lab test that is abnormal or we need to change your treatment, we will call you to review the results.    Follow-Up: At [facility], you and your health needs are our priority.  As part of our continuing mission to provide you with exceptional heart care, we have created designated Provider Care Teams.  These Care Teams include your primary Cardiologist (physician) and Advanced Practice Providers (APPs -  Physician Assistants and Nurse Practitioners) who all work together to provide you with the care you need, when you need it.  We recommend signing up for the patient portal called "MyChart".  Sign up information is provided on this After Visit Summary.  MyChart is used to connect with patients for Virtual Visits (Telemedicine).  Patients are able to view lab/test results, encounter notes, upcoming appointments, etc.  Non-urgent messages can be sent to your provider as well.   To learn more about what you can do with MyChart, go to [email or web address].    Your next appointment:   4 month(s)  Provider:   Charlton Haws, MD     Other Instructions

## 2023-07-20 ENCOUNTER — Other Ambulatory Visit (HOSPITAL_COMMUNITY): Payer: Self-pay

## 2023-07-26 ENCOUNTER — Other Ambulatory Visit: Payer: Self-pay | Admitting: Internal Medicine

## 2023-07-26 ENCOUNTER — Ambulatory Visit (HOSPITAL_COMMUNITY): Payer: Medicare HMO | Attending: Cardiovascular Disease

## 2023-07-26 DIAGNOSIS — I350 Nonrheumatic aortic (valve) stenosis: Secondary | ICD-10-CM | POA: Diagnosis present

## 2023-07-26 DIAGNOSIS — Z952 Presence of prosthetic heart valve: Secondary | ICD-10-CM | POA: Diagnosis present

## 2023-07-26 LAB — ECHOCARDIOGRAM COMPLETE
AV Mean grad: 10 mmHg
AV Peak grad: 17.7 mmHg
Ao pk vel: 2.11 m/s
Area-P 1/2: 3.98 cm2
Est EF: >75
MV M vel: 5.86 m/s
MV Peak grad: 137.4 mmHg
S' Lateral: 2.2 cm

## 2023-07-27 ENCOUNTER — Encounter (HOSPITAL_COMMUNITY): Payer: Self-pay

## 2023-07-27 ENCOUNTER — Telehealth (HOSPITAL_COMMUNITY): Payer: Self-pay

## 2023-07-27 NOTE — Telephone Encounter (Signed)
 Attempted to call patient in regards to Cardiac Rehab - LM on VM Mailed letter

## 2023-07-29 ENCOUNTER — Telehealth: Payer: Self-pay | Admitting: Pharmacy Technician

## 2023-07-29 ENCOUNTER — Other Ambulatory Visit (HOSPITAL_COMMUNITY): Payer: Self-pay

## 2023-07-29 ENCOUNTER — Telehealth: Payer: Self-pay | Admitting: Cardiovascular Disease

## 2023-07-29 ENCOUNTER — Other Ambulatory Visit: Payer: Self-pay | Admitting: Cardiovascular Disease

## 2023-07-29 MED ORDER — APIXABAN 5 MG PO TABS: 5.0000 mg | ORAL_TABLET | Freq: Two times a day (BID) | ORAL | Status: DC

## 2023-07-29 NOTE — Telephone Encounter (Signed)
 Pharmacy Patient Advocate Encounter   Received notification from Pt Calls Messages that prior authorization for eliquis is required/requested.   Insurance verification completed.   The patient is insured through [place] .   Per test claim: PA required; PA submitted to above mentioned insurance via CoverMyMeds Key/confirmation #/EOC [employer] Status is pending   Trying pa because all only assistance she would of had to already spent 3-4 percent of her income this year and majority of the people do not qualify.

## 2023-07-29 NOTE — Telephone Encounter (Signed)
 Pt c/o medication issue:  1. Name of Medication:   apixaban (ELIQUIS) 5 MG TABS tablet    2. How are you currently taking this medication (dosage and times per day)?  Take 1 tablet (5 mg total) by mouth 2 (two) times daily.      3. Are you having a reaction (difficulty breathing--STAT)? No  4. What is your medication issue? Pt requesting a callback regarding this medication no longer being covered under her insurance and she needs a prior [place]. She stated even with the PA she's not sure what she'd have to pay so now she'd like to discuss other options. Please advise .

## 2023-07-29 NOTE — Telephone Encounter (Signed)
 Called patient about her message. Informed patient that we are doing prior auth right now and waiting for response. Patient given samples to help until she can get help or decide on other options.

## 2023-08-01 ENCOUNTER — Other Ambulatory Visit (HOSPITAL_COMMUNITY): Payer: Self-pay

## 2023-08-01 ENCOUNTER — Encounter (HOSPITAL_COMMUNITY): Payer: Self-pay

## 2023-08-01 ENCOUNTER — Telehealth: Payer: Self-pay | Admitting: Cardiovascular Disease

## 2023-08-01 MED ORDER — AMOXICILLIN 500 MG PO TABS: ORAL_TABLET | ORAL | 0 refills | Status: DC

## 2023-08-01 NOTE — Telephone Encounter (Signed)
 Pt advised I will send in :  amoxicillin 2 grams once before procedure .

## 2023-08-01 NOTE — Telephone Encounter (Signed)
 Pt c/o medication issue:  1. Name of Medication:Antibiotics   2. How are you currently taking this medication (dosage and times per day)?   3. Are you having a reaction (difficulty breathing--STAT)? no  4. What is your medication issue? Yes pt said she need antibiotics for her dental work.

## 2023-08-01 NOTE — Telephone Encounter (Signed)
 Pharmacy Patient Advocate Encounter  Received notification from [facility] that Prior Authorization for eliquis 5mg  has been APPROVED from 05/24/23 to 05/23/24. Spoke to pharmacy to process.Copay is $47.00.    PA #/Case ID/Reference #: [id number]

## 2023-08-03 NOTE — Progress Notes (Unsigned)
 [address]       [place] [address]             [phone]           [name] [facility] Medical Record #[id number] Date of Birth: January 24, 1956  Wendall Stade, MD Julien Girt, PA-C  Chief Complaint:   follow up [name] procedure  History of Present Illness:     68 yo female now 2 months out from [place] procedure. Post op complicated with atrial flutter and bacteremia. She has been readmitted for SOB and treated with lasix that she no longer takes. TTE has shown normal LV function and well seated AVR with mean gradient of . She is feeling well. Her bp elevated today but she had not taken her BB today. Is interested in cardiac rehab      Past Medical History:  Diagnosis Date   Allergy    Anemia    Anxiety    Arthritis    Asthma    BMI 40.0-44.9, adult (HCC) 04/07/2014   DM type 2 (diabetes mellitus, type 2) (HCC)    GERD (gastroesophageal reflux disease)    HTN (hypertension)    Hypercholesterolemia    Osteoarthritis of left hip 04/07/2014   Reflux    Severe aortic stenosis    Spinal headache    with C-Section and with spinal fusion in 2019    Past Surgical History:  Procedure Laterality Date   ASCENDING AORTIC ROOT REPLACEMENT N/A 06/14/2023   Procedure: ASCENDING AORTIC ROOT REPLACEMENT USING KONECT RESILIA AORTIC VALVE CONDUIT SIZE AND REATTACHMENT OF RIGHT AND LEFT CORONARY;  Surgeon: Eugenio Hoes, MD;  Location: [facility] OR;  Service: Open Heart Surgery;  Laterality: N/A;   BACK SURGERY  2023   lumbar fusion, got infection, then had to re-do fusion   CESAREAN SECTION     x3   COLONOSCOPY     LAMINECTOMY  03/2021   LEFT HEART CATH AND CORONARY ANGIOGRAPHY N/A 04/06/2023   Procedure: LEFT HEART CATH AND CORONARY ANGIOGRAPHY;  Surgeon: Tonny Bollman, MD;  Location: [facility] INVASIVE CV LAB;  Service: Cardiovascular;  Laterality: N/A;   SPINAL FUSION  2019   TEE WITHOUT CARDIOVERSION N/A 06/14/2023   Procedure:  TRANSESOPHAGEAL ECHOCARDIOGRAM (TEE);  Surgeon: Eugenio Hoes, MD;  Location: [facility] OR;  Service: Open Heart Surgery;  Laterality: N/A;   TUBAL LIGATION      Social History   Tobacco Use  Smoking Status Never  Smokeless Tobacco Never    Social History   Substance and Sexual Activity  Alcohol Use No   Alcohol/week: 0.0 standard drinks of alcohol    Social History   Socioeconomic History   Marital status: Married    Spouse name: Not on file   Number of children: 3   Years of education: Not on file   Highest education level: Not on file  Occupational History   Not on file  Tobacco Use   Smoking status: Never   Smokeless tobacco: Never  Vaping Use   Vaping status: Never Used  Substance and Sexual Activity   Alcohol use: No    Alcohol/week: 0.0 standard drinks of alcohol   Drug use: No   Sexual activity: Yes  Other Topics Concern   Not on file  Social History Narrative   Not on file   Social Drivers of Health   Financial Resource Strain: Low Risk  (07/18/2023)   Received from [facility]   Overall  Financial Resource Strain (CARDIA)    Difficulty of Paying Living Expenses: Not hard at all  Food Insecurity: No Food Insecurity (07/18/2023)   Received from [facility]   Hunger Vital Sign    Worried About Running Out of Food in the Last Year: Never true    Ran Out of Food in the Last Year: Never true  Transportation Needs: No Transportation Needs (07/18/2023)   Received from [facility] - Transportation    Lack of Transportation (Medical): No    Lack of Transportation (Non-Medical): No  Physical Activity: Inactive (04/13/2023)   Received from [facility]   Exercise Vital Sign    Days of Exercise per Week: 0 days    Minutes of Exercise per Session: 30 min  Stress: No Stress Concern Present (04/13/2023)   Received from [facility] - Occupational Stress Questionnaire    Feeling of Stress : Only a little   Social Connections: Unknown (06/30/2023)   Social Connection and Isolation Panel [NHANES]    Frequency of Communication with Friends and Family: Once a week    Frequency of Social Gatherings with Friends and Family: Once a week    Attends Religious Services: Patient declined    Database administrator or Organizations: Patient declined    Attends Banker Meetings: Patient declined    Marital Status: Married  Recent Concern: Social Connections - Somewhat Isolated (04/13/2023)   Received from [employer]   Social Network    How would you rate your social network (family, work, friends)?: Restricted participation with some degree of social isolation  Intimate Partner Violence: Not At Risk (06/30/2023)   Humiliation, Afraid, Rape, and Kick questionnaire    Fear of Current or Ex-Partner: No    Emotionally Abused: No    Physically Abused: No    Sexually Abused: No    Allergies  Allergen Reactions   Crestor [Rosuvastatin] Other (See Comments)    Myalgia; patient can only tolerate taking 10 mg every other day   Robaxin [Methocarbamol] Other (See Comments)    Insomnia   Toradol [Ketorolac Tromethamine] Other (See Comments)   Zocor [Simvastatin] Other (See Comments)    Myalgias    Lipitor [Atorvastatin] Other (See Comments)    Myalgia   Sulfa Antibiotics Hives    Current Outpatient Medications  Medication Sig Dispense Refill   acetaminophen (TYLENOL) 500 MG tablet Take 1,000 mg by mouth every 6 (six) hours as needed for mild pain (pain score 1-3) or headache.     albuterol (PROAIR HFA) 108 (90 BASE) MCG/ACT inhaler Inhale 2 puffs into the lungs every 6 (six) hours as needed. 1 Inhaler 12   amiodarone (PACERONE) 200 MG tablet Take 1 tablet (200 mg total) by mouth daily. 90 tablet 3   amLODipine (NORVASC) 10 MG tablet Take 1 tablet (10 mg total) by mouth daily. (Patient taking differently: Take 10 mg by mouth at bedtime.) 90 tablet 1   amoxicillin (AMOXIL) 500 MG tablet Take 4  tablets (2000 mg) by mouth one hour prior to dental procedure. 4 tablet 0   apixaban (ELIQUIS) 5 MG TABS tablet Take 1 tablet (5 mg total) by mouth 2 (two) times daily. 60 tablet 1   apixaban (ELIQUIS) 5 MG TABS tablet Take 1 tablet (5 mg total) by mouth 2 (two) times daily. 56 tablet    ascorbic acid (VITAMIN C) 500 MG tablet Take 500 mg by mouth daily.  aspirin EC 81 MG tablet Take 1 tablet (81 mg total) by mouth daily. Swallow whole.     cetirizine (ZYRTEC) 10 MG tablet Take 1 tablet (10 mg total) by mouth daily. 30 tablet 0   docusate sodium (COLACE) 100 MG capsule Take 200-300 mg by mouth at bedtime.     ezetimibe (ZETIA) 10 MG tablet Take 1 tablet (10 mg total) by mouth daily. 30 tablet 1   fluticasone (FLONASE) 50 MCG/ACT nasal spray Place 2 sprays into both nostrils daily. (Patient taking differently: Place 2 sprays into both nostrils daily as needed for rhinitis.) 16 g 0   furosemide (LASIX) 40 MG tablet Take 1 tablet (40 mg total) by mouth daily as needed for edema or fluid (shortness of breath). 30 tablet 0   gabapentin (NEURONTIN) 600 MG tablet Take 600 mg by mouth 3 (three) times daily.     iron polysaccharides (NIFEREX) 150 MG capsule TAKE 1 CAPSULE (150 MG DOSE) BY MOUTH TWICE A DAY (Patient taking differently: Take 150 mg by mouth daily.) 180 capsule 1   losartan (COZAAR) 100 MG tablet Take 1 tablet (100 mg total) by mouth daily. 30 tablet 1   [Paused] metFORMIN (GLUCOPHAGE) 1000 MG tablet Take 1 tablet (1,000 mg total) by mouth 2 (two) times daily with a meal. 180 tablet 1   metoprolol tartrate (LOPRESSOR) 25 MG tablet Take 1 tablet (25 mg total) by mouth 2 (two) times daily. 180 tablet 3   ondansetron (ZOFRAN) 8 MG tablet Take 8 mg by mouth 3 (three) times daily.     pantoprazole (PROTONIX) 40 MG tablet Take 40 mg by mouth daily.     polyvinyl alcohol (LIQUIFILM TEARS) 1.4 % ophthalmic solution Place 1 drop into both eyes as needed for dry eyes.     rosuvastatin (CRESTOR) 10 MG  tablet Take 1 tablet (10 mg total) by mouth every other day. 45 tablet 3   Semaglutide, 2 MG/DOSE, (OZEMPIC, 2 MG/DOSE,) 8 MG/3ML SOPN Inject 2 mg into the skin once a week.     sulfamethoxazole-trimethoprim (BACTRIM) 400-80 MG tablet TAKE 1 TABLET BY MOUTH TWICE DAILY 60 tablet 0   No current facility-administered medications for this visit.     Family History  Problem Relation Age of Onset   Hypertension Mother    Stroke Mother    Heart disease Father    Stroke Brother    Multiple sclerosis Brother    Multiple sclerosis Sister    Colon cancer Neg Hx    Colon polyps Neg Hx    Esophageal cancer Neg Hx    Rectal cancer Neg Hx    Stomach cancer Neg Hx        Physical Exam: Appears well Lungs: clear Card: RR with no murmru Ext: no edema Neuro: intact     Diagnostic Studies & Laboratory data: I have personally reviewed the following studies and agree with the findings   TTE (07/2023) IMPRESSIONS     1. Left ventricular ejection fraction, by estimation, is >75%. The left  ventricle has hyperdynamic function. The left ventricle has no regional  wall motion abnormalities. There is mild left ventricular hypertrophy.  Left ventricular diastolic parameters  are consistent with Grade I diastolic dysfunction (impaired relaxation).  The average left ventricular global longitudinal strain is -21.8 %. The  global longitudinal strain is normal.   2. Right ventricular systolic function is normal. The right ventricular  size is normal.   3. The mitral valve is normal in structure. Mild  mitral valve  regurgitation. No evidence of mitral stenosis.   4. The aortic valve has been repaired/replaced. Aortic valve  regurgitation is not visualized. No aortic stenosis is present. 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 Procedure Date: 06/14/23. Echo  findings are consistent with normal structure and function of the aortic  valve  prosthesis.   5. The inferior vena cava is normal in size with greater than 50%  respiratory variability, suggesting right atrial pressure of 3 mmHg.   FINDINGS   Left Ventricle: Left ventricular ejection fraction, by estimation, is  >75%. The left ventricle has hyperdynamic function. The left ventricle has  no regional wall motion abnormalities. The average left ventricular global  longitudinal strain is -21.8 %.  Strain was performed and the global longitudinal strain is normal. The  left ventricular internal cavity size was normal in size. There is mild  left ventricular hypertrophy. Left ventricular diastolic parameters are  consistent with Grade I diastolic  dysfunction (impaired relaxation).   Right Ventricle: The right ventricular size is normal. Right ventricular  systolic function is normal.   Left Atrium: Left atrial size was normal in size.   Right Atrium: Right atrial size was normal in size.   Pericardium: Trivial pericardial effusion is present.   Mitral Valve: The mitral valve is normal in structure. Mild mitral annular  calcification. Mild mitral valve regurgitation. No evidence of mitral  valve stenosis.   Tricuspid Valve: The tricuspid valve is normal in structure. Tricuspid  valve regurgitation is trivial. No evidence of tricuspid stenosis.   Aortic Valve: The aortic valve has been repaired/replaced. Aortic valve  regurgitation is not visualized. No aortic stenosis is present. Aortic  valve mean gradient measures 10.0 mmHg. Aortic valve peak gradient  measures 17.7 mmHg. There is a 21 mm Connect   pericardial aortic valve conduit valve present in the aortic position.  Procedure Date: 06/14/23. Echo findings are consistent with normal  structure and function of the aortic valve prosthesis.   Pulmonic Valve: The pulmonic valve was normal in structure. Pulmonic valve  regurgitation is trivial. No evidence of pulmonic stenosis.   Aorta: The aortic root is  normal in size and structure.   Venous: The inferior vena cava is normal in size with greater than 50%  respiratory variability, suggesting right atrial pressure of 3 mmHg.   IAS/Shunts: No atrial level shunt detected by color flow Doppler.   Additional Comments: 3D was performed not requiring image post processing  on an independent workstation and was normal.     LEFT VENTRICLE  PLAX 2D  LVIDd:         3.50 cm Diastology  LVIDs:         2.20 cm LV e' medial:    5.43 cm/s  LV PW:         1.00 cm LV E/e' medial:  24.1  LV IVS:        1.00 cm LV e' lateral:   12.75 cm/s                         LV E/e' lateral: 10.3                           2D Longitudinal Strain  2D Strain GLS (A4C):   -23.2 %                         2D Strain GLS (A3C):   -20.9 %                         2D Strain GLS (A2C):   -21.3 %                         2D Strain GLS Avg:     -21.8 %                           3D Volume EF:                         3D EF:        55 %                         LV EDV:       97 ml                         LV ESV:       44 ml                         LV SV:        53 ml   RIGHT VENTRICLE             IVC  RV Basal diam:  3.80 cm     IVC diam: 1.30 cm  RV S prime:     10.08 cm/s  TAPSE (M-mode): 1.4 cm   LEFT ATRIUM             Index        RIGHT ATRIUM           Index  LA diam:        4.30 cm 2.43 cm/m   RA Area:     13.60 cm  LA Vol (A2C):   39.3 ml 22.23 ml/m  RA Volume:   33.00 ml  18.67 ml/m  LA Vol (A4C):   25.6 ml 14.48 ml/m  LA Biplane Vol: 32.4 ml 18.33 ml/m   AORTIC VALVE  AV Vmax:           210.60 cm/s  AV Vmean:          146.000 cm/s  AV VTI:            0.368 m  AV Peak Grad:      17.7 mmHg  AV Mean Grad:      10.0 mmHg  LVOT Vmax:         106.67 cm/s  LVOT Vmean:        75.100 cm/s  LVOT VTI:          0.204 m  LVOT/AV VTI ratio: 0.55    AORTA  Ao Root diam: 3.30 cm  Ao Asc diam:  3.10 cm   MITRAL VALVE  MV Area (PHT): 3.98  cm     SHUNTS  MV Decel Time: 191 msec     Systemic VTI: 0.20 m  MR Peak grad: 137.4 mmHg  MR Mean grad: 98.0 mmHg  MR Vmax:  586.00 cm/s  MR Vmean:     483.0 cm/s  MV E velocity: 131.00 cm/s  MV A velocity: 173.50 cm/s  MV E/A ratio:  0.76    Recent Radiology Findings:       Recent Lab Findings: Lab Results  Component Value Date   WBC 11.7 (H) 07/01/2023   HGB 10.5 (L) 07/01/2023   HCT 33.0 (L) 07/01/2023   PLT 530 (H) 07/01/2023   GLUCOSE 87 07/15/2023   CHOL 153 02/03/2023   TRIG 135 02/03/2023   HDL 43 02/03/2023   LDLDIRECT 106 (H) 04/27/2023   LDLCALC 86 02/03/2023   ALT 21 06/14/2023   AST 22 06/14/2023   NA 145 (H) 07/15/2023   K 4.3 07/15/2023   CL 106 07/15/2023   CREATININE 1.29 (H) 07/15/2023   BUN 13 07/15/2023   CO2 23 07/15/2023   TSH 4.110 04/28/2023   INR 1.4 (H) 06/14/2023   HGBA1C 5.9 (H) 05/24/2023      Assessment / Plan:   2 months sp [place] procedure and doing well. No restrictions. She was counseled on taking her bb. She will have follow up with cardiology and will discuss stopping amio and noac at 3 months. Will arrange cardiac rehapb     I have spent 30 min in review of the records, viewing studies and in face to face with patient and in coordination of future care    Eugenio Hoes 08/03/2023 7:08 PM

## 2023-08-04 ENCOUNTER — Ambulatory Visit (INDEPENDENT_AMBULATORY_CARE_PROVIDER_SITE_OTHER): Payer: Self-pay | Admitting: Thoracic Surgery (Cardiothoracic Vascular Surgery)

## 2023-08-04 ENCOUNTER — Other Ambulatory Visit (HOSPITAL_COMMUNITY): Payer: Self-pay

## 2023-08-04 VITALS — BP 173/85 | HR 86 | Resp 18 | Ht <= 58 in | Wt 198.0 lb

## 2023-08-04 DIAGNOSIS — Z952 Presence of prosthetic heart valve: Secondary | ICD-10-CM

## 2023-08-04 NOTE — Patient Instructions (Signed)
 Follow up prn

## 2023-08-05 ENCOUNTER — Telehealth (HOSPITAL_COMMUNITY): Payer: Self-pay

## 2023-08-05 NOTE — Telephone Encounter (Signed)
 Attempted to call patient in regards to Cardiac Rehab - LM on VM

## 2023-08-08 ENCOUNTER — Encounter (HOSPITAL_COMMUNITY): Payer: Self-pay | Admitting: *Deleted

## 2023-08-09 ENCOUNTER — Encounter: Payer: Self-pay | Admitting: Internal Medicine

## 2023-08-09 ENCOUNTER — Other Ambulatory Visit: Payer: Self-pay

## 2023-08-09 ENCOUNTER — Ambulatory Visit: Payer: Medicare HMO | Admitting: Internal Medicine

## 2023-08-09 VITALS — BP 136/79 | HR 78 | Resp 16 | Ht <= 58 in | Wt 198.0 lb

## 2023-08-09 DIAGNOSIS — Z952 Presence of prosthetic heart valve: Secondary | ICD-10-CM

## 2023-08-09 DIAGNOSIS — M4620 Osteomyelitis of vertebra, site unspecified: Secondary | ICD-10-CM | POA: Diagnosis not present

## 2023-08-09 DIAGNOSIS — T847XXD Infection and inflammatory reaction due to other internal orthopedic prosthetic devices, implants and grafts, subsequent encounter: Secondary | ICD-10-CM | POA: Diagnosis not present

## 2023-08-09 NOTE — Progress Notes (Signed)
 [facility]  Patient Active Problem List   Diagnosis Date Noted   S/P AVR (aortic valve replacement) 06/30/2023   Morbid obesity (HCC) 06/29/2023   Dyspnea 06/28/2023   AKI (acute kidney injury) (HCC) 06/19/2023   Complete heart block (HCC) 06/17/2023   Hyperglycemia 06/17/2023   Fever 06/17/2023   S/P AVR (aortic valve replacement) and aortoplasty 06/14/2023   Acute CHF (congestive heart failure) (HCC) 04/28/2023   Acute on chronic heart failure with preserved ejection fraction (HCC) 04/27/2023   Severe aortic stenosis    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   Microcytic anemia 01/25/2023   Class 2 severe obesity due to excess calories with serious comorbidity and body mass index (BMI) of 38.0 to 38.9 in adult (HCC) 07/08/2022   Primary osteoarthritis of right knee 07/08/2022   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   Bacteremia due to Proteus species    Sepsis (HCC) 02/03/2022   Iron deficiency anemia 02/03/2022   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   S/P lumbar spinal fusion 11/17/2021   Lumbar radiculopathy 04/21/2021   Type 2 diabetes mellitus without complication, without long-term current use of insulin (HCC) 12/22/2018   Hypertriglyceridemia 10/19/2018   Spondylolisthesis of lumbar region 02/01/2018   Gastroesophageal reflux disease without esophagitis 01/16/2018   Skin inflammation 08/25/2017   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   Essential hypertension 03/06/2017   Osteoarthritis of left hip 04/07/2014   BMI 40.0-44.9, adult (HCC) 04/07/2014   DM type 2 (diabetes mellitus, type 2) (HCC)    Hypercholesterolemia    Reflux       Subjective:    Patient ID: [name], female    DOB: [birth year], 68 y.o.   MRN: [id number]  Chief Complaint  Patient presents with   Follow-up     HPI:  [name] is a 68 y.o. female here for  f/u hx 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  She had associated bacteremia proteus mirabilis blood stream infection 02/03/2022, in setting mri imaging lumbar surgical site abscess/cellulitis. S/p I&D with all old hardware removed but new screws placed on 9/15  04/07/22 id clinic visit She had finished iv ceftriaxone of 8 weeks by 04/02/2022 and transitioned to bactrim ss bid; the cefazolin mic was 8  She walks with fww. She is sore in lower back bilaterally when moving but nothing in the middle. This is chronic. 7 at worst; 2/10 resting   No fever, chill, diarrhea   05/05/22 id clinic visit She had chart hx of sulfa allergy although she doesn't recall any bactrim use or other sulfa product use in the past She tolerated bactrim ss bid fine without n/v/diarrhea/rash Back pain is stable No f/c  She complains of 3-4 weeks right anterior thigh numbness/tingling. She wears tight clothing now and then but not consistently.   She has right lower ext weakness after back surgery but that is getting better. Lower back pain 3/10 not bad. Bilateral lower back sore/stiff after pt/ot session though. Overall since back surgery 01/2022 leg weakness/back pain better. Walks with walker still  Also complains of chronic tongue burning with things like toothpaste, spicy food. Sensation transient. Doesn't disturb her in other ways. This was heard while she was on ceftriaxone as well.      10/26/22 id clinic f/u Crp has been high still. She continues on bactrim ds 1 tab bid She still have  significant nausea but controlled with zofran Back pain 2/10 minimal like before We spoke about trial off medication abx 07/2022 but given crp will keep going, until we get 2 normal over 6 months span before considering it again    04/26/23 id clinic f/u    Component Value Date/Time   CRP 2.7 (H) 06/29/2023 1844   CRP 30.7 (H) 04/26/2023 1053   CRP 18.5 (H) 10/26/2022 1044   Lab  Results  Component Value Date   CREATININE 1.29 (H) 07/15/2023   Patient is here for f/u today She has been seeing cardiology for aortic stenosis planned for January Patient still taking bactrim suppressive abx She said she has gained a lot of weight and more dyspnea since seeing cardiology 2-3 weeks ago -- she hasn't spoken with them yet  She doesnot take any loop diuretics  "The back is good". Right leg anterior pain stable/chronic since surgery No fever/chill  Appetite is "too good."   08/09/23 id clinic f/u Reviewed 06/2023 labs Crp improving Doing well with back no new pain No f/c  She is s/p TAVR in 05/2023. Doing well with that -- no more dyspnea. And also the night pain in the hands also improves. No swelling in legs   Continues to take bactrim  Patient is on eliquis for afib   Allergies: Allergies  Allergen Reactions   Crestor [Rosuvastatin] Other (See Comments)    Myalgia; patient can only tolerate taking 10 mg every other day   Robaxin [Methocarbamol] Other (See Comments)    Insomnia   Toradol [Ketorolac Tromethamine] Other (See Comments)   Zocor [Simvastatin] Other (See Comments)    Myalgias    Lipitor [Atorvastatin] Other (See Comments)    Myalgia   Sulfa Antibiotics Hives      Outpatient Medications Prior to Visit  Medication Sig Dispense Refill   acetaminophen (TYLENOL) 500 MG tablet Take 1,000 mg by mouth every 6 (six) hours as needed for mild pain (pain score 1-3) or headache.     albuterol (PROAIR HFA) 108 (90 BASE) MCG/ACT inhaler Inhale 2 puffs into the lungs every 6 (six) hours as needed. 1 Inhaler 12   amiodarone (PACERONE) 200 MG tablet Take 1 tablet (200 mg total) by mouth daily. 90 tablet 3   apixaban (ELIQUIS) 5 MG TABS tablet Take 1 tablet (5 mg total) by mouth 2 (two) times daily. 60 tablet 1   apixaban (ELIQUIS) 5 MG TABS tablet Take 1 tablet (5 mg total) by mouth 2 (two) times daily. 56 tablet    ascorbic acid (VITAMIN C) 500 MG tablet  Take 500 mg by mouth daily.     aspirin EC 81 MG tablet Take 1 tablet (81 mg total) by mouth daily. Swallow whole.     cetirizine (ZYRTEC) 10 MG tablet Take 1 tablet (10 mg total) by mouth daily. 30 tablet 0   docusate sodium (COLACE) 100 MG capsule Take 200-300 mg by mouth at bedtime.     ezetimibe (ZETIA) 10 MG tablet Take 1 tablet (10 mg total) by mouth daily. 30 tablet 1   fluticasone (FLONASE) 50 MCG/ACT nasal spray Place 2 sprays into both nostrils daily. (Patient taking differently: Place 2 sprays into both nostrils daily as needed for rhinitis.) 16 g 0   furosemide (LASIX) 40 MG tablet Take 1 tablet (40 mg total) by mouth daily as needed for edema or fluid (shortness of breath). 30 tablet 0   gabapentin (NEURONTIN) 600 MG tablet Take 600 mg by mouth 3 (  three) times daily.     losartan (COZAAR) 100 MG tablet Take 1 tablet (100 mg total) by mouth daily. 30 tablet 1   metoprolol tartrate (LOPRESSOR) 25 MG tablet Take 1 tablet (25 mg total) by mouth 2 (two) times daily. 180 tablet 3   ondansetron (ZOFRAN) 8 MG tablet Take 8 mg by mouth 3 (three) times daily.     pantoprazole (PROTONIX) 40 MG tablet Take 40 mg by mouth daily.     polyvinyl alcohol (LIQUIFILM TEARS) 1.4 % ophthalmic solution Place 1 drop into both eyes as needed for dry eyes.     rosuvastatin (CRESTOR) 10 MG tablet Take 1 tablet (10 mg total) by mouth every other day. 45 tablet 3   Semaglutide, 2 MG/DOSE, (OZEMPIC, 2 MG/DOSE,) 8 MG/3ML SOPN Inject 2 mg into the skin once a week.     sulfamethoxazole-trimethoprim (BACTRIM) 400-80 MG tablet TAKE 1 TABLET BY MOUTH TWICE DAILY 60 tablet 0   amLODipine (NORVASC) 10 MG tablet Take 1 tablet (10 mg total) by mouth daily. (Patient not taking: Reported on 08/09/2023) 90 tablet 1   amoxicillin (AMOXIL) 500 MG tablet Take 4 tablets (2000 mg) by mouth one hour prior to dental procedure. (Patient not taking: Reported on 08/09/2023) 4 tablet 0   iron polysaccharides (NIFEREX) 150 MG capsule TAKE  1 CAPSULE (150 MG DOSE) BY MOUTH TWICE A DAY (Patient taking differently: Take 150 mg by mouth daily.) 180 capsule 1   metFORMIN (GLUCOPHAGE) 1000 MG tablet Take 1 tablet (1,000 mg total) by mouth 2 (two) times daily with a meal. (Patient not taking: Reported on 08/09/2023) 180 tablet 1   No facility-administered medications prior to visit.     Social History   Socioeconomic History   Marital status: Married    Spouse name: Not on file   Number of children: 3   Years of education: Not on file   Highest education level: Not on file  Occupational History   Not on file  Tobacco Use   Smoking status: Never   Smokeless tobacco: Never  Vaping Use   Vaping status: Never Used  Substance and Sexual Activity   Alcohol use: No    Alcohol/week: 0.0 standard drinks of alcohol   Drug use: No   Sexual activity: Yes  Other Topics Concern   Not on file  Social History Narrative   Not on file   Social Drivers of Health   Financial Resource Strain: Low Risk  (07/18/2023)   Received from [facility]   Overall Financial Resource Strain (CARDIA)    Difficulty of Paying Living Expenses: Not hard at all  Food Insecurity: No Food Insecurity (07/18/2023)   Received from [facility]   Hunger Vital Sign    Worried About Running Out of Food in the Last Year: Never true    Ran Out of Food in the Last Year: Never true  Transportation Needs: No Transportation Needs (07/18/2023)   Received from [facility] - Transportation    Lack of Transportation (Medical): No    Lack of Transportation (Non-Medical): No  Physical Activity: Inactive (04/13/2023)   Received from [facility]   Exercise Vital Sign    Days of Exercise per Week: 0 days    Minutes of Exercise per Session: 30 min  Stress: No Stress Concern Present (04/13/2023)   Received from [facility] of Occupational Health - Occupational Stress Questionnaire    Feeling of Stress : Only a little  Social  Connections: Unknown (06/30/2023)   Social Connection and Isolation Panel [NHANES]    Frequency of Communication with Friends and Family: Once a week    Frequency of Social Gatherings with Friends and Family: Once a week    Attends Religious Services: Patient declined    Database administrator or Organizations: Patient declined    Attends Banker Meetings: Patient declined    Marital Status: Married  Recent Concern: Social Connections - Somewhat Isolated (04/13/2023)   Received from [employer]   Social Network    How would you rate your social network (family, work, friends)?: Restricted participation with some degree of social isolation  Intimate Partner Violence: Not At Risk (06/30/2023)   Humiliation, Afraid, Rape, and Kick questionnaire    Fear of Current or Ex-Partner: No    Emotionally Abused: No    Physically Abused: No    Sexually Abused: No      Review of Systems    All other ros negative Objective:    BP 136/79   Pulse 78   Resp 16   Ht 4\' 9"  (1.448 m)   Wt 198 lb (89.8 kg)   SpO2 98%   BMI 42.85 kg/m  Nursing note and vital signs reviewed.  Physical Exam  General/constitutional: no distress, pleasant; walks with fww HEENT: Normocephalic, PER, Conj Clear, EOMI, Oropharynx clear Neck supple CV: rrr no mrg Lungs: clear to auscultation, normal respiratory effort Abd: Soft, Nontender Ext: facial plethora; bilateral LE trace edema Skin: No Rash Neuro: nonfocal MSK: no peripheral joint swelling/tenderness/warmth; back spines nontender      Labs: Lab Results  Component Value Date   WBC 11.7 (H) 07/01/2023   HGB 10.5 (L) 07/01/2023   HCT 33.0 (L) 07/01/2023   MCV 78.9 (L) 07/01/2023   PLT 530 (H) 07/01/2023   Last metabolic panel Lab Results  Component Value Date   GLUCOSE 87 07/15/2023   NA 145 (H) 07/15/2023   K 4.3 07/15/2023   CL 106 07/15/2023   CO2 23 07/15/2023   BUN 13 07/15/2023   CREATININE 1.29 (H) 07/15/2023   GFRNONAA  43 (L) 07/01/2023   CALCIUM 9.7 07/15/2023   PHOS 3.7 06/17/2023   PROT 7.0 06/14/2023   ALBUMIN 3.8 06/14/2023   LABGLOB 2.7 02/03/2023   AGRATIO 1.7 08/19/2017   BILITOT 0.4 06/14/2023   ALKPHOS 69 06/14/2023   AST 22 06/14/2023   ALT 21 06/14/2023   ANIONGAP 11 07/01/2023   Crp: 08/03/22     26.3   (<8) 04/2022    20.8   (<8)   Micro:  Serology:  Imaging: 03/31/22 mri lumbar spine 1. Postoperative changes since the prior MRI. No findings suspicious for persistent discitis, epidural abscess or paraspinal abscess. 2. New marrow signal abnormality in the L2 and L3 vertebral bodies and subsequent enhancement but no enhancement in the disc space to suggest persistent discitis. This could be remodeling changes related to the recent surgery and new hardware. 3. Some residual prevertebral inflammatory changes but no rim enhancing abscess.  Assessment & Plan:   Problem List Items Addressed This Visit   None Visit Diagnoses       Hardware complicating wound infection, subsequent encounter    -  Primary     Vertebral osteomyelitis (HCC)         S/P TAVR (transcatheter aortic valve replacement)             No orders of the defined types were placed in  this encounter.    #Post-op lumbar infection SP I&D and HW removal with Cx+ Proteus mirabilis  on 02/05/22; and placement of new screws into infected spine #Psoas abscess #Proteus mirabilis bacteremia  -02/03/22 blood cultures 1/2 Proteus mirabilis.  Initially started on vancomycin, cefepime, metronidazole. - initial MRI L-spine showed epidural abscess extending anteriorly from soft tissue to fusion hardware L3-L4. 1.1x0.8 cm focal area in right psoas muscle suspicious for small abscess - Underwent I&D with hardware removal on 9/15 with neurosurgery Dr. Franky Macho.  Of note all the old hardware was removed new pedicle screws were placed.  ID was engaged and plan for 8 weeks of antibiotics more.  OR cultures grew Proteus  mirabilis with cefazolin MIC 8  as such switched  cefazolin to ceftriaxone.   - s/p 8 weeks ceftriaxone by 11/10, switched to bactrim ss bid chronically  05/05/22 id assessment Repeat mri lumbar spine 03/31/22 showed no further abscess Clinically also improving   08/03/22 id clinic assessment Doing very well Discuss with her trial off abx Labs today and f/u 6-8 weeks when she'll decide on abx continuation  10/26/22 id clinic assessment Due to still elevated crp without obvious explanation, and despite minimal back pain, will keep going with bactrim for now until we have 2 normal at least 6 months apart She would like more zofran amount in hand for her nausea with bactrim Labs today for toxicity monitoring and crp monitoring Follow up 6 months Continue bactrim 1 ds tablet twice a day  04/26/23 id clinic assessment Back seems to be doing well AS awaiting tavr -- her infection is suppressed and shouldn't be a contraindication for tavr if needed Labs today Will revisit trial off abx at the appropriate time (after tavr and only with persistently normal crp) Continue bactrim SS bid suppression   Advise patient to call cardiology clinic regarding recent more rapid weight gain/dyspnea.  08/09/23 id clinic assessment S/p tavr 05/2023 On eliquis for afib Tolerating bactrim No new sx with lower back  We discussed trial off abx for the back previously. For now given recent tavr I want to make sure she does well on that with cardiac rehab before considering trial off abx bactrim  Labs today  F/u 10 weeks   Follow-up: Return in about 10 weeks (around 10/18/2023).      Raymondo Band, MD [facility] 08/09/2023, 11:03 AM

## 2023-08-09 NOTE — Patient Instructions (Signed)
 Continue bactrim antibiotics for your back   Message me if you are about 2 weeks out of bactrim so I can refill   Labs today   See me in 10-12 weeks

## 2023-08-10 LAB — COMPLETE METABOLIC PANEL WITH GFR
AG Ratio: 1.6 (calc) (ref 1.0–2.5)
ALT: 18 U/L (ref 6–29)
AST: 19 U/L (ref 10–35)
Albumin: 4.5 g/dL (ref 3.6–5.1)
Alkaline phosphatase (APISO): 80 U/L (ref 37–153)
BUN/Creatinine Ratio: 16 (calc) (ref 6–22)
BUN: 25 mg/dL (ref 7–25)
CO2: 26 mmol/L (ref 20–32)
Calcium: 9.7 mg/dL (ref 8.6–10.4)
Chloride: 108 mmol/L (ref 98–110)
Creat: 1.58 mg/dL — ABNORMAL HIGH (ref 0.50–1.05)
Globulin: 2.8 g/dL (ref 1.9–3.7)
Glucose, Bld: 85 mg/dL (ref 65–99)
Potassium: 4.4 mmol/L (ref 3.5–5.3)
Sodium: 145 mmol/L (ref 135–146)
Total Bilirubin: 0.2 mg/dL (ref 0.2–1.2)
Total Protein: 7.3 g/dL (ref 6.1–8.1)

## 2023-08-10 LAB — CBC
HCT: 33.3 % — ABNORMAL LOW (ref 35.0–45.0)
Hemoglobin: 10.2 g/dL — ABNORMAL LOW (ref 11.7–15.5)
MCH: 24.3 pg — ABNORMAL LOW (ref 27.0–33.0)
MCHC: 30.6 g/dL — ABNORMAL LOW (ref 32.0–36.0)
MCV: 79.3 fL — ABNORMAL LOW (ref 80.0–100.0)
MPV: 11.2 fL (ref 7.5–12.5)
Platelets: 273 10*3/uL (ref 140–400)
RBC: 4.2 10*6/uL (ref 3.80–5.10)
RDW: 16.7 % — ABNORMAL HIGH (ref 11.0–15.0)
WBC: 6.1 10*3/uL (ref 3.8–10.8)

## 2023-08-10 LAB — C-REACTIVE PROTEIN: CRP: 31.1 mg/L — ABNORMAL HIGH (ref <8.0)

## 2023-08-11 ENCOUNTER — Encounter (HOSPITAL_COMMUNITY)
Admission: RE | Admit: 2023-08-11 | Discharge: 2023-08-11 | Disposition: A | Discharge status: Home / Self Care | Source: Ambulatory Visit | Attending: Cardiovascular Disease | Admitting: Cardiovascular Disease

## 2023-08-11 VITALS — BP 132/70 | HR 85 | Ht <= 58 in | Wt 199.5 lb

## 2023-08-11 DIAGNOSIS — Z952 Presence of prosthetic heart valve: Secondary | ICD-10-CM | POA: Diagnosis present

## 2023-08-11 DIAGNOSIS — Z48812 Encounter for surgical aftercare following surgery on the circulatory system: Secondary | ICD-10-CM | POA: Insufficient documentation

## 2023-08-11 DIAGNOSIS — Z7901 Long term (current) use of anticoagulants: Secondary | ICD-10-CM | POA: Diagnosis not present

## 2023-08-11 LAB — GLUCOSE, CAPILLARY: Glucose-Capillary: 110 mg/dL — ABNORMAL HIGH (ref 70–99)

## 2023-08-11 NOTE — Progress Notes (Signed)
 Cardiac Individual Treatment Plan  Patient Details  Name: [name] MRN: [id number] Date of Birth: 1955/08/21 Referring Provider:   Flowsheet Row INTENSIVE CARDIAC REHAB ORIENT from 08/11/2023 in [facility]  Referring Provider Charlton Haws, MD       Initial Encounter Date:  Flowsheet Row INTENSIVE CARDIAC REHAB ORIENT from 08/11/2023 in [facility]  Date 08/11/23       Visit Diagnosis: 06/14/23 S/P AVR (aortic valve replacement)  Patient's Home Medications on Admission:  Current Outpatient Medications:    acetaminophen (TYLENOL) 500 MG tablet, Take 1,000 mg by mouth every 6 (six) hours as needed for mild pain (pain score 1-3) or headache., Disp: , Rfl:    albuterol (PROAIR HFA) 108 (90 BASE) MCG/ACT inhaler, Inhale 2 puffs into the lungs every 6 (six) hours as needed., Disp: 1 Inhaler, Rfl: 12   amiodarone (PACERONE) 200 MG tablet, Take 1 tablet (200 mg total) by mouth daily., Disp: 90 tablet, Rfl: 3   amLODipine (NORVASC) 10 MG tablet, Take 1 tablet (10 mg total) by mouth daily., Disp: 90 tablet, Rfl: 1   apixaban (ELIQUIS) 5 MG TABS tablet, Take 1 tablet (5 mg total) by mouth 2 (two) times daily., Disp: 60 tablet, Rfl: 1   ascorbic acid (VITAMIN C) 500 MG tablet, Take 500 mg by mouth daily., Disp: , Rfl:    aspirin EC 81 MG tablet, Take 1 tablet (81 mg total) by mouth daily. Swallow whole., Disp: , Rfl:    cetirizine (ZYRTEC) 10 MG tablet, Take 1 tablet (10 mg total) by mouth daily., Disp: 30 tablet, Rfl: 0   docusate sodium (COLACE) 100 MG capsule, Take 200-300 mg by mouth at bedtime., Disp: , Rfl:    ezetimibe (ZETIA) 10 MG tablet, Take 1 tablet (10 mg total) by mouth daily., Disp: 30 tablet, Rfl: 1   fluticasone (FLONASE) 50 MCG/ACT nasal spray, Place 2 sprays into both nostrils daily. (Patient taking differently: Place 2 sprays into both nostrils daily as  needed for rhinitis.), Disp: 16 g, Rfl: 0   furosemide (LASIX) 40 MG tablet, Take 1 tablet (40 mg total) by mouth daily as needed for edema or fluid (shortness of breath)., Disp: 30 tablet, Rfl: 0   gabapentin (NEURONTIN) 600 MG tablet, Take 600 mg by mouth 3 (three) times daily., Disp: , Rfl:    iron polysaccharides (NIFEREX) 150 MG capsule, TAKE 1 CAPSULE (150 MG DOSE) BY MOUTH TWICE A DAY (Patient taking differently: Take 150 mg by mouth daily.), Disp: 180 capsule, Rfl: 1   losartan (COZAAR) 100 MG tablet, Take 1 tablet (100 mg total) by mouth daily., Disp: 30 tablet, Rfl: 1   metoprolol tartrate (LOPRESSOR) 25 MG tablet, Take 1 tablet (25 mg total) by mouth 2 (two) times daily., Disp: 180 tablet, Rfl: 3   ondansetron (ZOFRAN) 8 MG tablet, Take 8 mg by mouth 3 (three) times daily., Disp: , Rfl:    pantoprazole (PROTONIX) 40 MG tablet, Take 40 mg by mouth daily., Disp: , Rfl:    polyvinyl alcohol (LIQUIFILM TEARS) 1.4 % ophthalmic solution, Place 1 drop into both eyes as needed for dry eyes., Disp: , Rfl:    rosuvastatin (CRESTOR) 10 MG tablet, Take 1 tablet (10 mg total) by mouth every other day., Disp: 45 tablet, Rfl: 3   Semaglutide, 2 MG/DOSE, (OZEMPIC, 2 MG/DOSE,) 8 MG/3ML SOPN, Inject 2 mg into the skin once a  week., Disp: , Rfl:    sulfamethoxazole-trimethoprim (BACTRIM) 400-80 MG tablet, TAKE 1 TABLET BY MOUTH TWICE DAILY, Disp: 60 tablet, Rfl: 0   amoxicillin (AMOXIL) 500 MG tablet, Take 4 tablets (2000 mg) by mouth one hour prior to dental procedure. (Patient not taking: Reported on 08/11/2023), Disp: 4 tablet, Rfl: 0   apixaban (ELIQUIS) 5 MG TABS tablet, Take 1 tablet (5 mg total) by mouth 2 (two) times daily., Disp: 56 tablet, Rfl:    [Paused] metFORMIN (GLUCOPHAGE) 1000 MG tablet, Take 1 tablet (1,000 mg total) by mouth 2 (two) times daily with a meal. (Patient not taking: Reported on 08/09/2023), Disp: 180 tablet, Rfl: 1  Past Medical History: Past Medical History:  Diagnosis Date    Allergy    Anemia    Anxiety    Arthritis    Asthma    BMI 40.0-44.9, adult (HCC) 04/07/2014   DM type 2 (diabetes mellitus, type 2) (HCC)    GERD (gastroesophageal reflux disease)    HTN (hypertension)    Hypercholesterolemia    Osteoarthritis of left hip 04/07/2014   Reflux    Severe aortic stenosis    Spinal headache    with C-Section and with spinal fusion in 2019    Tobacco Use: Social History   Tobacco Use  Smoking Status Never  Smokeless Tobacco Never    Labs: Review Flowsheet  More data exists      Latest Ref Rng & Units 02/03/2023 04/27/2023 04/28/2023 05/24/2023 06/14/2023  Labs for ITP Cardiac and Pulmonary Rehab  Cholestrol 100 - 199 mg/dL 875  - - - -  LDL (calc) 0 - 99 mg/dL 86  - - - -  Direct LDL 0 - 99 mg/dL - 643  - - -  HDL-C >32 mg/dL 43  - - - -  Trlycerides 0 - 149 mg/dL 951  - - - -  Hemoglobin A1c 4.8 - 5.6 % - - 6.2  5.9  -  PH, Arterial 7.35 - 7.45 - - - - 7.313  7.297  7.319  7.263  7.295  7.434  7.450  7.404   PCO2 arterial 32 - 48 mmHg - - - - 52.4  48.8  50.6  54.5  50.1  37.5  38.2  45.6   Bicarbonate 20.0 - 28.0 mmol/L - - - - 26.5  24.0  26.2  24.9  24.4  25.1  26.6  28.2  28.6   TCO2 22 - 32 mmol/L - - - - 28  25  28  27  23  26  25  26  26  28  27  30  30  27  26    Acid-base deficit 0.0 - 2.0 mmol/L - - - - 3.0  1.0  3.0  2.0   O2 Saturation % - - - - 98  98  97  87  100  100  100  63  100     Details       Multiple values from one day are sorted in reverse-chronological order         Capillary Blood Glucose: Lab Results  Component Value Date   GLUCAP 110 (H) 08/11/2023   GLUCAP 92 07/01/2023   GLUCAP 106 (H) 06/30/2023   GLUCAP 85 06/30/2023   GLUCAP 92 06/30/2023     Exercise Target Goals: Exercise Program Goal: Individual exercise prescription set using results from initial 6 min walk test and THRR while considering  patient's activity barriers and safety.  Exercise Prescription Goal: Initial exercise prescription  builds to 30-45 minutes a day of aerobic activity, 2-3 days per week.  Home exercise guidelines will be given to patient during program as part of exercise prescription that the participant will acknowledge.  Activity Barriers & Risk Stratification:  Activity Barriers & Cardiac Risk Stratification - 08/11/23 1421       Activity Barriers & Cardiac Risk Stratification   Activity Barriers Balance Concerns;Arthritis;Joint Problems;History of Falls;Back Problems;Deconditioning;Assistive Device;Muscular Weakness;Shortness of Breath;Neck/Spine Problems    Cardiac Risk Stratification High             6 Minute Walk:  6 Minute Walk     Row Name 08/11/23 1419         6 Minute Walk   Phase Initial  Pt used her rollator     Distance 25.53 feet     Walk Time 6 minutes     # of Rest Breaks 1  Pt stopped at 5:40, 20 seconds early     MPH 1.82     METS 2.15     RPE 13     Perceived Dyspnea  1     VO2 Peak 7.53     Symptoms Yes (comment)     Comments 5/10 right knee pain     Resting HR 83 bpm     Resting BP 132/70     Resting Oxygen Saturation  98 %     Exercise Oxygen Saturation  during 6 min walk 99 %     Max Ex. HR 105 bpm     Max Ex. BP 180/90     2 Minute Post BP 134/80              Oxygen Initial Assessment:   Oxygen Re-Evaluation:   Oxygen Discharge (Final Oxygen Re-Evaluation):   Initial Exercise Prescription:  Initial Exercise Prescription - 08/11/23 1400       Date of Initial Exercise RX and Referring Provider   Date 08/11/23    Referring Provider Charlton Haws, MD    Expected Discharge Date 11/02/23      NuStep   Level 3    SPM 75    Minutes 25    METs 2.1      Prescription Details   Frequency (times per week) 3    Duration Progress to 30 minutes of continuous aerobic without signs/symptoms of physical distress      Intensity   THRR 40-80% of Max Heartrate 62-123    Ratings of Perceived Exertion 11-13    Perceived Dyspnea 0-4      Progression    Progression Continue progressive overload as per policy without signs/symptoms or physical distress.      Resistance Training   Training Prescription Yes    Weight 2 lbs    Reps 10-15             Perform Capillary Blood Glucose checks as needed.  Exercise Prescription Changes:   Exercise Comments:   Exercise Goals and Review:   Exercise Goals     Row Name 08/11/23 1422             Exercise Goals   Increase Physical Activity Yes       Intervention Provide advice, education, support and counseling about physical activity/exercise needs.;Develop an individualized exercise prescription for aerobic and resistive training based on initial evaluation findings, risk stratification, comorbidities and participant's personal goals.       Expected Outcomes Short Term: Attend rehab on a  regular basis to increase amount of physical activity.;Long Term: Exercising regularly at least 3-5 days a week.;Long Term: Add in home exercise to make exercise part of routine and to increase amount of physical activity.       Increase Strength and Stamina Yes       Intervention Provide advice, education, support and counseling about physical activity/exercise needs.;Develop an individualized exercise prescription for aerobic and resistive training based on initial evaluation findings, risk stratification, comorbidities and participant's personal goals.       Expected Outcomes Short Term: Increase workloads from initial exercise prescription for resistance, speed, and METs.;Short Term: Perform resistance training exercises routinely during rehab and add in resistance training at home;Long Term: Improve cardiorespiratory fitness, muscular endurance and strength as measured by increased METs and functional capacity ( )       Able to understand and use rate of perceived exertion (RPE) scale Yes       Intervention Provide education and explanation on how to use RPE scale       Expected Outcomes Short  Term: Able to use RPE daily in rehab to express subjective intensity level;Long Term:  Able to use RPE to guide intensity level when exercising independently       Knowledge and understanding of Target Heart Rate Range (THRR) Yes       Intervention Provide education and explanation of THRR including how the numbers were predicted and where they are located for reference       Expected Outcomes Short Term: Able to state/look up THRR;Long Term: Able to use THRR to govern intensity when exercising independently;Short Term: Able to use daily as guideline for intensity in rehab       Understanding of Exercise Prescription Yes       Intervention Provide education, explanation, and written materials on patient's individual exercise prescription       Expected Outcomes Short Term: Able to explain program exercise prescription;Long Term: Able to explain home exercise prescription to exercise independently                Exercise Goals Re-Evaluation :   Discharge Exercise Prescription (Final Exercise Prescription Changes):   Nutrition:  Target Goals: Understanding of nutrition guidelines, daily intake of sodium 1500mg , cholesterol 200mg , calories 30% from fat and 7% or less from saturated fats, daily to have 5 or more servings of fruits and vegetables.  Biometrics:  Pre Biometrics - 08/11/23 1105       Pre Biometrics   Waist Circumference 47 inches    Hip Circumference 51.25 inches    Waist to Hip Ratio 0.92 %    Triceps Skinfold 33 mm    % Body Fat 53.3 %    Grip Strength 17 kg    Flexibility --   Not performed, hx of back surgery x 4   Single Leg Stand 5.57 seconds              Nutrition Therapy Plan and Nutrition Goals:   Nutrition Assessments:  MEDIFICTS Score Key: >=70 Need to make dietary changes  40-70 Heart Healthy Diet <= 40 Therapeutic Level Cholesterol Diet    Picture Your Plate Scores: <60 Unhealthy dietary pattern with much room for improvement. 41-50  Dietary pattern unlikely to meet recommendations for good health and room for improvement. 51-60 More healthful dietary pattern, with some room for improvement.  >60 Healthy dietary pattern, although there may be some specific behaviors that could be improved.    Nutrition Goals Re-Evaluation:  Nutrition Goals Re-Evaluation:   Nutrition Goals Discharge (Final Nutrition Goals Re-Evaluation):   Psychosocial: Target Goals: Acknowledge presence or absence of significant depression and/or stress, maximize coping skills, provide positive support system. Participant is able to verbalize types and ability to use techniques and skills needed for reducing stress and depression.  Initial Review & Psychosocial Screening:  Initial Psych Review & Screening - 08/11/23 1432       Initial Review   Current issues with Current Stress Concerns    Comments Pt voices a low level stress in regards to her recent surgery      Family Dynamics   Good Support System? Yes   Pt has husband for support     Barriers   Psychosocial barriers to participate in program The patient should benefit from training in stress management and relaxation.      Screening Interventions   Interventions Encouraged to exercise    Expected Outcomes Short Term goal: Utilizing psychosocial counselor, staff and physician to assist with identification of specific Stressors or current issues interfering with healing process. Setting desired goal for each stressor or current issue identified.;Long Term Goal: Stressors or current issues are controlled or eliminated.;Short Term goal: Identification and review with participant of any Quality of Life or Depression concerns found by scoring the questionnaire.;Long Term goal: The participant improves quality of Life and PHQ9 Scores as seen by post scores and/or verbalization of changes             Quality of Life Scores:  Quality of Life - 08/11/23 1432       Quality of Life    Select Quality of Life      Quality of Life Scores   Health/Function Pre 25.53 %    Socioeconomic Pre 24.75 %    Psych/Spiritual Pre 25.29 %    Family Pre 27.3 %    GLOBAL Pre 25.61 %            Scores of 19 and below usually indicate a poorer quality of life in these areas.  A difference of  2-3 points is a clinically meaningful difference.  A difference of 2-3 points in the total score of the Quality of Life Index has been associated with significant improvement in overall quality of life, self-image, physical symptoms, and general health in studies assessing change in quality of life.  PHQ-9: Review Flowsheet  More data exists      08/11/2023 08/09/2023 04/26/2023 10/26/2022 09/30/2017  Depression screen PHQ 2/9  Decreased Interest 0 0 0 0 0  Down, Depressed, Hopeless 0 0 0 0 0  PHQ - 2 Score 0 0 0 0 0  Altered sleeping 0 - - - -  Tired, decreased energy 1 - - - -  Change in appetite 0 - - - -  Feeling bad or failure about yourself  0 - - - -  Trouble concentrating 0 - - - -  Moving slowly or fidgety/restless 0 - - - -  Suicidal thoughts 0 - - - -  PHQ-9 Score 1 - - - -  Difficult doing work/chores Not difficult at all - - - -   Interpretation of Total Score  Total Score Depression Severity:  1-4 = Minimal depression, 5-9 = Mild depression, 10-14 = Moderate depression, 15-19 = Moderately severe depression, 20-27 = Severe depression   Psychosocial Evaluation and Intervention:   Psychosocial Re-Evaluation:   Psychosocial Discharge (Final Psychosocial Re-Evaluation):   Vocational Rehabilitation: Provide vocational rehab assistance to qualifying  candidates.   Vocational Rehab Evaluation & Intervention:  Vocational Rehab - 08/11/23 1433       Initial Vocational Rehab Evaluation & Intervention   Assessment shows need for Vocational Rehabilitation No   pt is retired            Education: Education Goals: Education classes will be provided on a weekly basis,  covering required topics. Participant will state understanding/return demonstration of topics presented.     Core Videos: Exercise    Move It!  Clinical staff conducted group or individual video education with verbal and written material and guidebook.  Patient learns the recommended Pritikin exercise program. Exercise with the goal of living a long, healthy life. Some of the health benefits of exercise include controlled diabetes, healthier blood pressure levels, improved cholesterol levels, improved heart and lung capacity, improved sleep, and better body composition. Everyone should speak with their doctor before starting or changing an exercise routine.  Biomechanical Limitations Clinical staff conducted group or individual video education with verbal and written material and guidebook.  Patient learns how biomechanical limitations can impact exercise and how we can mitigate and possibly overcome limitations to have an impactful and balanced exercise routine.  Body Composition Clinical staff conducted group or individual video education with verbal and written material and guidebook.  Patient learns that body composition (ratio of muscle mass to fat mass) is a key component to assessing overall fitness, rather than body weight alone. Increased fat mass, especially visceral belly fat, can put [place] at increased risk for metabolic syndrome, type 2 diabetes, heart disease, and even death. It is recommended to combine diet and exercise (cardiovascular and resistance training) to improve your body composition. Seek guidance from your physician and exercise physiologist before implementing an exercise routine.  Exercise Action Plan Clinical staff conducted group or individual video education with verbal and written material and guidebook.  Patient learns the recommended strategies to achieve and enjoy long-term exercise adherence, including variety, self-motivation, self-efficacy, and positive  decision making. Benefits of exercise include fitness, good health, weight management, more energy, better sleep, less stress, and overall well-being.  Medical   Heart Disease Risk Reduction Clinical staff conducted group or individual video education with verbal and written material and guidebook.  Patient learns our heart is our most vital organ as it circulates oxygen, nutrients, white blood cells, and hormones throughout the entire body, and carries waste away. Data supports a plant-based eating plan like the Pritikin Program for its effectiveness in slowing progression of and reversing heart disease. The video provides a number of recommendations to address heart disease.   Metabolic Syndrome and Belly Fat  Clinical staff conducted group or individual video education with verbal and written material and guidebook.  Patient learns what metabolic syndrome is, how it leads to heart disease, and how one can reverse it and keep it from coming back. You have metabolic syndrome if you have 3 of the following 5 criteria: abdominal obesity, high blood pressure, high triglycerides, low HDL cholesterol, and high blood sugar.  Hypertension and Heart Disease Clinical staff conducted group or individual video education with verbal and written material and guidebook.  Patient learns that high blood pressure, or hypertension, is very common in the [place]. Hypertension is largely due to excessive salt intake, but other important risk factors include being overweight, physical inactivity, drinking too much alcohol, smoking, and not eating enough potassium from fruits and vegetables. High blood pressure is a leading risk factor for heart attack,  stroke, congestive heart failure, dementia, kidney failure, and premature death. Long-term effects of excessive salt intake include stiffening of the arteries and thickening of heart muscle and organ damage. Recommendations include ways to reduce hypertension and the  risk of heart disease.  Diseases of Our Time - Focusing on Diabetes Clinical staff conducted group or individual video education with verbal and written material and guidebook.  Patient learns why the best way to stop diseases of our time is prevention, through food and other lifestyle changes. Medicine (such as prescription pills and surgeries) is often only a Band-Aid on the problem, not a long-term solution. Most common diseases of our time include obesity, type 2 diabetes, hypertension, heart disease, and cancer. The Pritikin Program is recommended and has been proven to help reduce, reverse, and/or prevent the damaging effects of metabolic syndrome.  Nutrition   Overview of the Pritikin Eating Plan  Clinical staff conducted group or individual video education with verbal and written material and guidebook.  Patient learns about the Pritikin Eating Plan for disease risk reduction. The Pritikin Eating Plan emphasizes a wide variety of unrefined, minimally-processed carbohydrates, like fruits, vegetables, whole grains, and legumes. Go, Caution, and Stop food choices are explained. Plant-based and lean animal proteins are emphasized. Rationale provided for low sodium intake for blood pressure control, low added sugars for blood sugar stabilization, and low added fats and oils for coronary artery disease risk reduction and weight management.  Calorie Density  Clinical staff conducted group or individual video education with verbal and written material and guidebook.  Patient learns about calorie density and how it impacts the Pritikin Eating Plan. Knowing the characteristics of the food you choose will help you decide whether those foods will lead to weight gain or weight loss, and whether you want to consume more or less of them. Weight loss is usually a side effect of the Pritikin Eating Plan because of its focus on low calorie-dense foods.  Label Reading  Clinical staff conducted group or  individual video education with verbal and written material and guidebook.  Patient learns about the Pritikin recommended label reading guidelines and corresponding recommendations regarding calorie density, added sugars, sodium content, and whole grains.  Dining Out - Part 1  Clinical staff conducted group or individual video education with verbal and written material and guidebook.  Patient learns that restaurant meals can be sabotaging because they can be so high in calories, fat, sodium, and/or sugar. Patient learns recommended strategies on how to positively address this and avoid unhealthy pitfalls.  Facts on Fats  Clinical staff conducted group or individual video education with verbal and written material and guidebook.  Patient learns that lifestyle modifications can be just as effective, if not more so, as many medications for lowering your risk of heart disease. A Pritikin lifestyle can help to reduce your risk of inflammation and atherosclerosis (cholesterol build-up, or plaque, in the artery walls). Lifestyle interventions such as dietary choices and physical activity address the cause of atherosclerosis. A review of the types of fats and their impact on blood cholesterol levels, along with dietary recommendations to reduce fat intake is also included.  Nutrition Action Plan  Clinical staff conducted group or individual video education with verbal and written material and guidebook.  Patient learns how to incorporate Pritikin recommendations into their lifestyle. Recommendations include planning and keeping personal health goals in mind as an important part of their success.  Healthy Mind-Set    Healthy Minds, Bodies, Hearts  Clinical staff  conducted group or individual video education with verbal and written material and guidebook.  Patient learns how to identify when they are stressed. Video will discuss the impact of that stress, as well as the many benefits of stress management.  Patient will also be introduced to stress management techniques. The way we think, act, and feel has an impact on our hearts.  How Our Thoughts Can Heal Our Hearts  Clinical staff conducted group or individual video education with verbal and written material and guidebook.  Patient learns that negative thoughts can cause depression and anxiety. This can result in negative lifestyle behavior and serious health problems. Cognitive behavioral therapy is an effective method to help control our thoughts in order to change and improve our emotional outlook.  Additional Videos:  Exercise    Improving Performance  Clinical staff conducted group or individual video education with verbal and written material and guidebook.  Patient learns to use a non-linear approach by alternating intensity levels and lengths of time spent exercising to help burn more calories and lose more body fat. Cardiovascular exercise helps improve heart health, metabolism, hormonal balance, blood sugar control, and recovery from fatigue. Resistance training improves strength, endurance, balance, coordination, reaction time, metabolism, and muscle mass. Flexibility exercise improves circulation, posture, and balance. Seek guidance from your physician and exercise physiologist before implementing an exercise routine and learn your capabilities and proper form for all exercise.  Introduction to Yoga  Clinical staff conducted group or individual video education with verbal and written material and guidebook.  Patient learns about yoga, a discipline of the coming together of mind, breath, and body. The benefits of yoga include improved flexibility, improved range of motion, better posture and core strength, increased lung function, weight loss, and positive self-image. Yoga's heart health benefits include lowered blood pressure, healthier heart rate, decreased cholesterol and triglyceride levels, improved immune function, and reduced stress.  Seek guidance from your physician and exercise physiologist before implementing an exercise routine and learn your capabilities and proper form for all exercise.  Medical   Aging: Enhancing Your Quality of Life  Clinical staff conducted group or individual video education with verbal and written material and guidebook.  Patient learns key strategies and recommendations to stay in good physical health and enhance quality of life, such as prevention strategies, having an advocate, securing a Health Care Proxy and Power of Attorney, and keeping a list of medications and system for tracking them. It also discusses how to avoid risk for bone loss.  Biology of Weight Control  Clinical staff conducted group or individual video education with verbal and written material and guidebook.  Patient learns that weight gain occurs because we consume more calories than we burn (eating more, moving less). Even if your body weight is normal, you may have higher ratios of fat compared to muscle mass. Too much body fat puts you at increased risk for cardiovascular disease, heart attack, stroke, type 2 diabetes, and obesity-related cancers. In addition to exercise, following the Pritikin Eating Plan can help reduce your risk.  Decoding Lab Results  Clinical staff conducted group or individual video education with verbal and written material and guidebook.  Patient learns that lab test reflects one measurement whose values change over time and are influenced by many factors, including medication, stress, sleep, exercise, food, hydration, pre-existing medical conditions, and more. It is recommended to use the knowledge from this video to become more involved with your lab results and evaluate your numbers to speak with  your doctor.   Diseases of Our Time - Overview  Clinical staff conducted group or individual video education with verbal and written material and guidebook.  Patient learns that according to the CDC, 50%  to 70% of chronic diseases (such as obesity, type 2 diabetes, elevated lipids, hypertension, and heart disease) are avoidable through lifestyle improvements including healthier food choices, listening to satiety cues, and increased physical activity.  Sleep Disorders Clinical staff conducted group or individual video education with verbal and written material and guidebook.  Patient learns how good quality and duration of sleep are important to overall health and well-being. Patient also learns about sleep disorders and how they impact health along with recommendations to address them, including discussing with a physician.  Nutrition  Dining Out - Part 2 Clinical staff conducted group or individual video education with verbal and written material and guidebook.  Patient learns how to plan ahead and communicate in order to maximize their dining experience in a healthy and nutritious manner. Included are recommended food choices based on the type of restaurant the patient is visiting.   Fueling a Banker conducted group or individual video education with verbal and written material and guidebook.  There is a strong connection between our food choices and our health. Diseases like obesity and type 2 diabetes are very prevalent and are in large-part due to lifestyle choices. The Pritikin Eating Plan provides plenty of food and hunger-curbing satisfaction. It is easy to follow, affordable, and helps reduce health risks.  Menu Workshop  Clinical staff conducted group or individual video education with verbal and written material and guidebook.  Patient learns that restaurant meals can sabotage health goals because they are often packed with calories, fat, sodium, and sugar. Recommendations include strategies to plan ahead and to communicate with the manager, chef, or server to help order a healthier meal.  Planning Your Eating Strategy  Clinical staff conducted group or individual  video education with verbal and written material and guidebook.  Patient learns about the Pritikin Eating Plan and its benefit of reducing the risk of disease. The Pritikin Eating Plan does not focus on calories. Instead, it emphasizes high-quality, nutrient-rich foods. By knowing the characteristics of the foods, we choose, we can determine their calorie density and make informed decisions.  Targeting Your Nutrition Priorities  Clinical staff conducted group or individual video education with verbal and written material and guidebook.  Patient learns that lifestyle habits have a tremendous impact on disease risk and progression. This video provides eating and physical activity recommendations based on your personal health goals, such as reducing LDL cholesterol, losing weight, preventing or controlling type 2 diabetes, and reducing high blood pressure.  Vitamins and Minerals  Clinical staff conducted group or individual video education with verbal and written material and guidebook.  Patient learns different ways to obtain key vitamins and minerals, including through a recommended healthy diet. It is important to discuss all supplements you take with your doctor.   Healthy Mind-Set    Smoking Cessation  Clinical staff conducted group or individual video education with verbal and written material and guidebook.  Patient learns that cigarette smoking and tobacco addiction pose a serious health risk which affects millions of people. Stopping smoking will significantly reduce the risk of heart disease, lung disease, and many forms of cancer. Recommended strategies for quitting are covered, including working with your doctor to develop a successful plan.  Culinary   Becoming a Financial risk analyst  staff conducted group or individual video education with verbal and written material and guidebook.  Patient learns that cooking at home can be healthy, cost-effective, quick, and puts them in control.  Keys to cooking healthy recipes will include looking at your recipe, assessing your equipment needs, planning ahead, making it simple, choosing cost-effective seasonal ingredients, and limiting the use of added fats, salts, and sugars.  Cooking - Breakfast and Snacks  Clinical staff conducted group or individual video education with verbal and written material and guidebook.  Patient learns how important breakfast is to satiety and nutrition through the entire day. Recommendations include key foods to eat during breakfast to help stabilize blood sugar levels and to prevent overeating at meals later in the day. Planning ahead is also a key component.  Cooking - Educational psychologist conducted group or individual video education with verbal and written material and guidebook.  Patient learns eating strategies to improve overall health, including an approach to cook more at home. Recommendations include thinking of animal protein as a side on your plate rather than center stage and focusing instead on lower calorie dense options like vegetables, fruits, whole grains, and plant-based proteins, such as beans. Making sauces in large quantities to freeze for later and leaving the skin on your vegetables are also recommended to maximize your experience.  Cooking - Healthy Salads and Dressing Clinical staff conducted group or individual video education with verbal and written material and guidebook.  Patient learns that vegetables, fruits, whole grains, and legumes are the foundations of the Pritikin Eating Plan. Recommendations include how to incorporate each of these in flavorful and healthy salads, and how to create homemade salad dressings. Proper handling of ingredients is also covered. Cooking - Soups and [employer] - Soups and Desserts Clinical staff conducted group or individual video education with verbal and written material and guidebook.  Patient learns that Pritikin soups and  desserts make for easy, nutritious, and delicious snacks and meal components that are low in sodium, fat, sugar, and calorie density, while high in vitamins, minerals, and filling fiber. Recommendations include simple and healthy ideas for soups and desserts.   Overview     The Pritikin Solution Program Overview Clinical staff conducted group or individual video education with verbal and written material and guidebook.  Patient learns that the results of the Pritikin Program have been documented in more than 100 articles published in peer-reviewed journals, and the benefits include reducing risk factors for (and, in some cases, even reversing) high cholesterol, high blood pressure, type 2 diabetes, obesity, and more! An overview of the three key pillars of the Pritikin Program will be covered: eating well, doing regular exercise, and having a healthy mind-set.  WORKSHOPS  Exercise: Exercise Basics: Building Your Action Plan Clinical staff led group instruction and group discussion with PowerPoint presentation and patient guidebook. To enhance the learning environment the use of posters, models and videos may be added. At the conclusion of this workshop, patients will comprehend the difference between physical activity and exercise, as well as the benefits of incorporating both, into their routine. Patients will understand the FITT (Frequency, Intensity, Time, and Type) principle and how to use it to build an exercise action plan. In addition, safety concerns and other considerations for exercise and cardiac rehab will be addressed by the presenter. The purpose of this lesson is to promote a comprehensive and effective weekly exercise routine in order to improve patients' overall level of fitness.  Managing Heart Disease: Your Path to a Healthier Heart Clinical staff led group instruction and group discussion with PowerPoint presentation and patient guidebook. To enhance the learning environment  the use of posters, models and videos may be added.At the conclusion of this workshop, patients will understand the anatomy and physiology of the heart. Additionally, they will understand how Pritikin's three pillars impact the risk factors, the progression, and the management of heart disease.  The purpose of this lesson is to provide a high-level overview of the heart, heart disease, and how the Pritikin lifestyle positively impacts risk factors.  Exercise Biomechanics Clinical staff led group instruction and group discussion with PowerPoint presentation and patient guidebook. To enhance the learning environment the use of posters, models and videos may be added. Patients will learn how the structural parts of their bodies function and how these functions impact their daily activities, movement, and exercise. Patients will learn how to promote a neutral spine, learn how to manage pain, and identify ways to improve their physical movement in order to promote healthy living. The purpose of this lesson is to expose patients to common physical limitations that impact physical activity. Participants will learn practical ways to adapt and manage aches and pains, and to minimize their effect on regular exercise. Patients will learn how to maintain good posture while sitting, walking, and lifting.  Balance Training and Fall Prevention  Clinical staff led group instruction and group discussion with PowerPoint presentation and patient guidebook. To enhance the learning environment the use of posters, models and videos may be added. At the conclusion of this workshop, patients will understand the importance of their sensorimotor skills (vision, proprioception, and the vestibular system) in maintaining their ability to balance as they age. Patients will apply a variety of balancing exercises that are appropriate for their current level of function. Patients will understand the common causes for  poor balance, possible solutions to these problems, and ways to modify their physical environment in order to minimize their fall risk. The purpose of this lesson is to teach patients about the importance of maintaining balance as they age and ways to minimize their risk of falling.  WORKSHOPS   Nutrition:  Fueling a Ship broker led group instruction and group discussion with PowerPoint presentation and patient guidebook. To enhance the learning environment the use of posters, models and videos may be added. Patients will review the foundational principles of the Pritikin Eating Plan and understand what constitutes a serving size in each of the food groups. Patients will also learn Pritikin-friendly foods that are better choices when away from home and review make-ahead meal and snack options. Calorie density will be reviewed and applied to three nutrition priorities: weight maintenance, weight loss, and weight gain. The purpose of this lesson is to reinforce (in a group setting) the key concepts around what patients are recommended to eat and how to apply these guidelines when away from home by planning and selecting Pritikin-friendly options. Patients will understand how calorie density may be adjusted for different weight management goals.  Mindful Eating  Clinical staff led group instruction and group discussion with PowerPoint presentation and patient guidebook. To enhance the learning environment the use of posters, models and videos may be added. Patients will briefly review the concepts of the Pritikin Eating Plan and the importance of low-calorie dense foods. The concept of mindful eating will be introduced as well as the importance of paying attention to internal hunger signals. Triggers for non-hunger eating and  techniques for dealing with triggers will be explored. The purpose of this lesson is to provide patients with the opportunity to review the basic principles of the  Pritikin Eating Plan, discuss the value of eating mindfully and how to measure internal cues of hunger and fullness using the Hunger Scale. Patients will also discuss reasons for non-hunger eating and learn strategies to use for controlling emotional eating.  Targeting Your Nutrition Priorities Clinical staff led group instruction and group discussion with PowerPoint presentation and patient guidebook. To enhance the learning environment the use of posters, models and videos may be added. Patients will learn how to determine their genetic susceptibility to disease by reviewing their family history. Patients will gain insight into the importance of diet as part of an overall healthy lifestyle in mitigating the impact of genetics and other environmental insults. The purpose of this lesson is to provide patients with the opportunity to assess their personal nutrition priorities by looking at their family history, their own health history and current risk factors. Patients will also be able to discuss ways of prioritizing and modifying the Pritikin Eating Plan for their highest risk areas  Menu  Clinical staff led group instruction and group discussion with PowerPoint presentation and patient guidebook. To enhance the learning environment the use of posters, models and videos may be added. Using menus brought in from [employer], or printed from [employer], patients will apply the Pritikin dining out guidelines that were presented in the [employer] video. Patients will also be able to practice these guidelines in a variety of provided scenarios. The purpose of this lesson is to provide patients with the opportunity to practice hands-on learning of the Pritikin Dining Out guidelines with actual menus and practice scenarios.  Label Reading Clinical staff led group instruction and group discussion with PowerPoint presentation and patient guidebook. To enhance the learning environment  the use of posters, models and videos may be added. Patients will review and discuss the Pritikin label reading guidelines presented in Pritikin's Label Reading Educational series video. Using fool labels brought in from local grocery stores and markets, patients will apply the label reading guidelines and determine if the packaged food meet the Pritikin guidelines. The purpose of this lesson is to provide patients with the opportunity to review, discuss, and practice hands-on learning of the Pritikin Label Reading guidelines with actual packaged food labels. Cooking School  Pritikin's [employer] are designed to teach patients ways to prepare quick, simple, and affordable recipes at home. The importance of nutrition's role in chronic disease risk reduction is reflected in its emphasis in the overall Pritikin program. By learning how to prepare essential core Pritikin Eating Plan recipes, patients will increase control over what they eat; be able to customize the flavor of foods without the use of added salt, sugar, or fat; and improve the quality of the food they consume. By learning a set of core recipes which are easily assembled, quickly prepared, and affordable, patients are more likely to prepare more healthy foods at home. These workshops focus on convenient breakfasts, simple entres, side dishes, and desserts which can be prepared with minimal effort and are consistent with nutrition recommendations for cardiovascular risk reduction. Cooking [employer] are taught by a Armed forces logistics/support/administrative officer (RD) who has been trained by the [employer]. The chef or RD has a clear understanding of the importance of minimizing - if not completely eliminating - added fat, sugar, and sodium in  recipes. Throughout the series of Cooking School Workshop sessions, patients will learn about healthy ingredients and efficient methods of cooking to build confidence in their capability to  prepare    Cooking School weekly topics:  Adding Flavor- Sodium-Free  Fast and Healthy Breakfasts  Powerhouse Plant-Based Proteins  Satisfying Salads and Dressings  Simple Sides and Sauces  International Cuisine-Spotlight on the [employer] Zones  Delicious Desserts  Savory Soups  [employer] - Meals in a Astronomer Appetizers and Snacks  Comforting Weekend Breakfasts  One-Pot Wonders   Fast Evening Meals  Landscape architect Your Pritikin Plate  WORKSHOPS   Healthy Mindset (Psychosocial):  Focused Goals, Sustainable Changes Clinical staff led group instruction and group discussion with PowerPoint presentation and patient guidebook. To enhance the learning environment the use of posters, models and videos may be added. Patients will be able to apply effective goal setting strategies to establish at least one personal goal, and then take consistent, meaningful action toward that goal. They will learn to identify common barriers to achieving personal goals and develop strategies to overcome them. Patients will also gain an understanding of how our mind-set can impact our ability to achieve goals and the importance of cultivating a positive and growth-oriented mind-set. The purpose of this lesson is to provide patients with a deeper understanding of how to set and achieve personal goals, as well as the tools and strategies needed to overcome common obstacles which may arise along the way.  From Head to Heart: The Power of a Healthy Outlook  Clinical staff led group instruction and group discussion with PowerPoint presentation and patient guidebook. To enhance the learning environment the use of posters, models and videos may be added. Patients will be able to recognize and describe the impact of emotions and mood on physical health. They will discover the importance of self-care and explore self-care practices which may work for them. Patients will also learn how to utilize  the 4 C's to cultivate a healthier outlook and better manage stress and challenges. The purpose of this lesson is to demonstrate to patients how a healthy outlook is an essential part of maintaining good health, especially as they continue their cardiac rehab journey.  Healthy Sleep for a Healthy Heart Clinical staff led group instruction and group discussion with PowerPoint presentation and patient guidebook. To enhance the learning environment the use of posters, models and videos may be added. At the conclusion of this workshop, patients will be able to demonstrate knowledge of the importance of sleep to overall health, well-being, and quality of life. They will understand the symptoms of, and treatments for, common sleep disorders. Patients will also be able to identify daytime and nighttime behaviors which impact sleep, and they will be able to apply these tools to help manage sleep-related challenges. The purpose of this lesson is to provide patients with a general overview of sleep and outline the importance of quality sleep. Patients will learn about a few of the most common sleep disorders. Patients will also be introduced to the concept of "sleep hygiene," and discover ways to self-manage certain sleeping problems through simple daily behavior changes. Finally, the workshop will motivate patients by clarifying the links between quality sleep and their goals of heart-healthy living.   Recognizing and Reducing Stress Clinical staff led group instruction and group discussion with PowerPoint presentation and patient guidebook. To enhance the learning environment the use of posters, models and videos may be added. At the conclusion of this  workshop, patients will be able to understand the types of stress reactions, differentiate between acute and chronic stress, and recognize the impact that chronic stress has on their health. They will also be able to apply different coping mechanisms, such as reframing  negative self-talk. Patients will have the opportunity to practice a variety of stress management techniques, such as deep abdominal breathing, progressive muscle relaxation, and/or guided imagery.  The purpose of this lesson is to educate patients on the role of stress in their lives and to provide healthy techniques for coping with it.  Learning Barriers/Preferences:  Learning Barriers/Preferences - 08/11/23 1433       Learning Barriers/Preferences   Learning Barriers Sight   reading glasses   Learning Preferences Audio;Computer/Internet;Group Instruction;Individual Instruction;Pictoral;Skilled Demonstration;Verbal Instruction;Video;Written Material             Education Topics:  Knowledge Questionnaire Score:  Knowledge Questionnaire Score - 08/11/23 1433       Knowledge Questionnaire Score   Pre Score 21/24             Core Components/Risk Factors/Patient Goals at Admission:  Personal Goals and Risk Factors at Admission - 08/11/23 1433       Core Components/Risk Factors/Patient Goals on Admission    Weight Management Yes;Obesity;Weight Loss    Intervention Weight Management: Develop a combined nutrition and exercise program designed to reach desired caloric intake, while maintaining appropriate intake of nutrient and fiber, sodium and fats, and appropriate energy expenditure required for the weight goal.;Weight Management: Provide education and appropriate resources to help participant work on and attain dietary goals.;Weight Management/Obesity: Establish reasonable short term and long term weight goals.;Obesity: Provide education and appropriate resources to help participant work on and attain dietary goals.    Expected Outcomes Short Term: Continue to assess and modify interventions until short term weight is achieved;Long Term: Adherence to nutrition and physical activity/exercise program aimed toward attainment of established weight goal;Weight Loss: Understanding of  general recommendations for a balanced deficit meal plan, which promotes 1-2 lb weight loss per week and includes a negative energy balance of [phone] kcal/d;Understanding recommendations for meals to include 15-35% energy as protein, 25-35% energy from fat, 35-60% energy from carbohydrates, less than 200mg  of dietary cholesterol, 20-35 gm of total fiber daily;Understanding of distribution of calorie intake throughout the day with the consumption of 4-5 meals/snacks    Diabetes Yes    Intervention Provide education about signs/symptoms and action to take for hypo/hyperglycemia.;Provide education about proper nutrition, including hydration, and aerobic/resistive exercise prescription along with prescribed medications to achieve blood glucose in normal ranges: Fasting glucose 65-99 mg/dL    Expected Outcomes Short Term: Participant verbalizes understanding of the signs/symptoms and immediate care of hyper/hypoglycemia, proper foot care and importance of medication, aerobic/resistive exercise and nutrition plan for blood glucose control.;Long Term: Attainment of HbA1C < 7%.    Hypertension Yes    Intervention Provide education on lifestyle modifcations including regular physical activity/exercise, weight management, moderate sodium restriction and increased consumption of fresh fruit, vegetables, and low fat dairy, alcohol moderation, and smoking cessation.;Monitor prescription use compliance.    Expected Outcomes Short Term: Continued assessment and intervention until BP is < 140/24mm HG in hypertensive participants. < 130/66mm HG in hypertensive participants with diabetes, heart failure or chronic kidney disease.;Long Term: Maintenance of blood pressure at goal levels.    Lipids Yes    Intervention Provide education and support for participant on nutrition & aerobic/resistive exercise along with prescribed medications to achieve LDL 70mg , HDL >  40mg .    Expected Outcomes Short Term: Participant states  understanding of desired cholesterol values and is compliant with medications prescribed. Participant is following exercise prescription and nutrition guidelines.;Long Term: Cholesterol controlled with medications as prescribed, with individualized exercise RX and with personalized nutrition plan. Value goals: LDL < 70mg , HDL > 40 mg.    Stress Yes    Intervention Offer individual and/or small group education and counseling on adjustment to heart disease, stress management and health-related lifestyle change. Teach and support self-help strategies.;Refer participants experiencing significant psychosocial distress to appropriate mental health specialists for further evaluation and treatment. When possible, include family members and significant others in education/counseling sessions.    Expected Outcomes Short Term: Participant demonstrates changes in health-related behavior, relaxation and other stress management skills, ability to obtain effective social support, and compliance with psychotropic medications if prescribed.;Long Term: Emotional wellbeing is indicated by absence of clinically significant psychosocial distress or social isolation.             Core Components/Risk Factors/Patient Goals Review:    Core Components/Risk Factors/Patient Goals at Discharge (Final Review):    ITP Comments:  ITP Comments     Row Name 08/11/23 1021           ITP Comments Rondell Reams, MD:  Medical Director.  Introduction to the [facility]/ Intensive Cardiac Rehab. Initial oreintation packet reviewed with the patient.                Comments: Participant attended orientation for the cardiac rehabilitation program on  08/11/2023  to perform initial intake and exercise walk test. Patient introduced to the [facility] education and orientation packet was reviewed. Completed 6-minute walk test using her rollator, measurements, initial ITP, and exercise prescription. Vital signs  stable. Telemetry-normal sinus rhythm, asymptomatic.   Service time was from 10:19 to 12:54.

## 2023-08-11 NOTE — Progress Notes (Signed)
 Cardiac Rehab Medication Review   Does the patient  feel that his/her medications are working for him/her?  yes  Has the patient been experiencing any side effects to the medications prescribed?  yes  Does the patient measure his/her own blood pressure or blood glucose at home?  yes   Does the patient have any problems obtaining medications due to transportation or finances?   no  Understanding of regimen: excellent Understanding of indications: excellent Potential of compliance: excellent    Comments: No question regarding her medications. Pt checks blood sugars and blood pressures at home. Pt voices that the Ozempic gives her constipation, thus she takes the Colace.     Lorin Picket 08/11/2023 11:01 AM

## 2023-08-17 ENCOUNTER — Encounter (HOSPITAL_COMMUNITY)
Admission: RE | Admit: 2023-08-17 | Discharge: 2023-08-17 | Disposition: A | Discharge status: Home / Self Care | Source: Ambulatory Visit | Attending: Cardiovascular Disease | Admitting: Cardiovascular Disease

## 2023-08-17 DIAGNOSIS — Z952 Presence of prosthetic heart valve: Secondary | ICD-10-CM

## 2023-08-17 DIAGNOSIS — Z48812 Encounter for surgical aftercare following surgery on the circulatory system: Secondary | ICD-10-CM | POA: Diagnosis not present

## 2023-08-17 LAB — GLUCOSE, CAPILLARY
Glucose-Capillary: 89 mg/dL (ref 70–99)
Glucose-Capillary: 83 mg/dL (ref 70–99)

## 2023-08-17 NOTE — Progress Notes (Signed)
 Daily Session Note  Patient Details  Name: [name] MRN: [id number] Date of Birth: 1956-02-15 Referring Provider:   Flowsheet Row INTENSIVE CARDIAC REHAB ORIENT from 08/11/2023 in [facility]  Referring Provider Charlton Haws, MD       Encounter Date: 08/17/2023  Check In:  Session Check In - 08/17/23 1030       Check-In   Supervising physician immediately available to respond to emergencies [facility] MD immediately available    Physician(s) Eligha Bridegroom, NP    Location [facility]    Staff Present Lorin Picket, MS, ACSM-CEP, CCRP, Exercise Physiologist;Monike Bragdon, RN, Cathlean Cower, MS, Exercise Physiologist;Olinty Peggye Pitt, MS, ACSM-CEP, Exercise Physiologist;Jetta Dan Humphreys BS, ACSM-CEP, Exercise Physiologist;Johnny Hale Bogus, MS, Exercise Physiologist    Virtual Visit No    Medication changes reported     No    Fall or balance concerns reported    No    Tobacco Cessation No Change    Warm-up and Cool-down Performed as group-led instruction    Resistance Training Performed No    VAD Patient? No    PAD/SET Patient? No      Pain Assessment   Currently in Pain? No/denies    Pain Score 0-No pain    Multiple Pain Sites No             Capillary Blood Glucose: Results for orders placed or performed during the hospital encounter of 08/11/23 (from the past 24 hours)  Glucose, capillary     Status: None   Collection Time: 08/17/23 10:24 AM  Result Value Ref Range   Glucose-Capillary 89 70 - 99 mg/dL  Glucose, capillary     Status: None   Collection Time: 08/17/23 11:11 AM  Result Value Ref Range   Glucose-Capillary 83 70 - 99 mg/dL     Exercise Prescription Changes - 08/17/23 1024       Response to Exercise   Blood Pressure (Admit) 118/80    Blood Pressure (Exercise) 166/80    Blood Pressure (Exit) 134/78    Heart Rate (Admit) 85 bpm    Heart Rate (Exercise) 94 bpm    Heart Rate  (Exit) 81 bpm    Rating of Perceived Exertion (Exercise) 9    Symptoms None    Comments Off to a good start with exercise.    Duration Progress to 30 minutes of  aerobic without signs/symptoms of physical distress    Intensity THRR unchanged      Progression   Progression Continue to progress workloads to maintain intensity without signs/symptoms of physical distress.    Average METs 1.6      Resistance Training   Training Prescription No    Weight Relaxation day, no weights.      Interval Training   Interval Training No      NuStep   Level 1    SPM 124    Minutes 25    METs 1.6             Social History   Tobacco Use  Smoking Status Never  Smokeless Tobacco Never    Goals Met:  Exercise tolerated well No signs or symptoms reported during exercise today.  Goals Unmet:  Not Applicable  Comments: Pt started cardiac rehab today.  Pt tolerated light exercise without difficulty. VSS, telemetry-Sinus rhythm, Bundle Branch Block, asymptomatic.  Medication list reconciled. Pt denies barriers to medicaiton compliance.  PSYCHOSOCIAL ASSESSMENT:  PHQ-1. Pt exhibits positive  coping skills, hopeful outlook with supportive family. No psychosocial needs identified at this time, no psychosocial interventions necessary. [name] says that she has had low energy since surgery and hopes that participating in cardiac rehab will improve her endurance.    Pt enjoys taking care of her 2 foster children, church, bowling and going to the casino.   Pt oriented to exercise equipment and routine.    Understanding verbalized. [name] is somewhat deconditioned and uses a rollator for stability.Thayer Headings RN BSN    Dr. Armanda Magic is Medical Director for Cardiac Rehab at [facility].

## 2023-08-22 ENCOUNTER — Encounter (HOSPITAL_COMMUNITY)
Admission: RE | Admit: 2023-08-22 | Discharge: 2023-08-22 | Disposition: A | Discharge status: Home / Self Care | Source: Ambulatory Visit | Attending: Cardiovascular Disease | Admitting: Cardiovascular Disease

## 2023-08-22 DIAGNOSIS — Z48812 Encounter for surgical aftercare following surgery on the circulatory system: Secondary | ICD-10-CM | POA: Diagnosis not present

## 2023-08-22 DIAGNOSIS — Z952 Presence of prosthetic heart valve: Secondary | ICD-10-CM

## 2023-08-22 LAB — GLUCOSE, CAPILLARY: Glucose-Capillary: 126 mg/dL — ABNORMAL HIGH (ref 70–99)

## 2023-08-22 NOTE — Progress Notes (Signed)
 Cardiac Individual Treatment Plan  Patient Details  Name: [name] MRN: [id number] Date of Birth: 08/04/1955 Referring Provider:   Flowsheet Row INTENSIVE CARDIAC REHAB ORIENT from 08/11/2023 in [facility]  Referring Provider Charlton Haws, MD       Initial Encounter Date:  Flowsheet Row INTENSIVE CARDIAC REHAB ORIENT from 08/11/2023 in [facility]  Date 08/11/23       Visit Diagnosis: 06/14/23 S/P AVR (aortic valve replacement)  Patient's Home Medications on Admission:  Current Outpatient Medications:    acetaminophen (TYLENOL) 500 MG tablet, Take 1,000 mg by mouth every 6 (six) hours as needed for mild pain (pain score 1-3) or headache., Disp: , Rfl:    albuterol (PROAIR HFA) 108 (90 BASE) MCG/ACT inhaler, Inhale 2 puffs into the lungs every 6 (six) hours as needed., Disp: 1 Inhaler, Rfl: 12   amiodarone (PACERONE) 200 MG tablet, Take 1 tablet (200 mg total) by mouth daily., Disp: 90 tablet, Rfl: 3   amLODipine (NORVASC) 10 MG tablet, Take 1 tablet (10 mg total) by mouth daily., Disp: 90 tablet, Rfl: 1   amoxicillin (AMOXIL) 500 MG tablet, Take 4 tablets (2000 mg) by mouth one hour prior to dental procedure. (Patient not taking: Reported on 08/11/2023), Disp: 4 tablet, Rfl: 0   apixaban (ELIQUIS) 5 MG TABS tablet, Take 1 tablet (5 mg total) by mouth 2 (two) times daily., Disp: 60 tablet, Rfl: 1   apixaban (ELIQUIS) 5 MG TABS tablet, Take 1 tablet (5 mg total) by mouth 2 (two) times daily., Disp: 56 tablet, Rfl:    ascorbic acid (VITAMIN C) 500 MG tablet, Take 500 mg by mouth daily., Disp: , Rfl:    aspirin EC 81 MG tablet, Take 1 tablet (81 mg total) by mouth daily. Swallow whole., Disp: , Rfl:    cetirizine (ZYRTEC) 10 MG tablet, Take 1 tablet (10 mg total) by mouth daily., Disp: 30 tablet, Rfl: 0   docusate sodium (COLACE) 100 MG capsule, Take 200-300 mg by  mouth at bedtime., Disp: , Rfl:    ezetimibe (ZETIA) 10 MG tablet, Take 1 tablet (10 mg total) by mouth daily., Disp: 30 tablet, Rfl: 1   fluticasone (FLONASE) 50 MCG/ACT nasal spray, Place 2 sprays into both nostrils daily. (Patient taking differently: Place 2 sprays into both nostrils daily as needed for rhinitis.), Disp: 16 g, Rfl: 0   furosemide (LASIX) 40 MG tablet, Take 1 tablet (40 mg total) by mouth daily as needed for edema or fluid (shortness of breath)., Disp: 30 tablet, Rfl: 0   gabapentin (NEURONTIN) 600 MG tablet, Take 600 mg by mouth 3 (three) times daily., Disp: , Rfl:    iron polysaccharides (NIFEREX) 150 MG capsule, TAKE 1 CAPSULE (150 MG DOSE) BY MOUTH TWICE A DAY (Patient taking differently: Take 150 mg by mouth daily.), Disp: 180 capsule, Rfl: 1   losartan (COZAAR) 100 MG tablet, Take 1 tablet (100 mg total) by mouth daily., Disp: 30 tablet, Rfl: 1   [Paused] metFORMIN (GLUCOPHAGE) 1000 MG tablet, Take 1 tablet (1,000 mg total) by mouth 2 (two) times daily with a meal. (Patient not taking: Reported on 08/09/2023), Disp: 180 tablet, Rfl: 1   metoprolol tartrate (LOPRESSOR) 25 MG tablet, Take 1 tablet (25 mg total) by mouth 2 (two) times daily., Disp: 180 tablet, Rfl: 3   ondansetron (ZOFRAN) 8 MG tablet, Take 8 mg by mouth 3 (three)  times daily., Disp: , Rfl:    pantoprazole (PROTONIX) 40 MG tablet, Take 40 mg by mouth daily., Disp: , Rfl:    polyvinyl alcohol (LIQUIFILM TEARS) 1.4 % ophthalmic solution, Place 1 drop into both eyes as needed for dry eyes., Disp: , Rfl:    rosuvastatin (CRESTOR) 10 MG tablet, Take 1 tablet (10 mg total) by mouth every other day., Disp: 45 tablet, Rfl: 3   Semaglutide, 2 MG/DOSE, (OZEMPIC, 2 MG/DOSE,) 8 MG/3ML SOPN, Inject 2 mg into the skin once a week., Disp: , Rfl:    sulfamethoxazole-trimethoprim (BACTRIM) 400-80 MG tablet, TAKE 1 TABLET BY MOUTH TWICE DAILY, Disp: 60 tablet, Rfl: 0  Past Medical History: Past Medical History:  Diagnosis Date    Allergy    Anemia    Anxiety    Arthritis    Asthma    BMI 40.0-44.9, adult (HCC) 04/07/2014   DM type 2 (diabetes mellitus, type 2) (HCC)    GERD (gastroesophageal reflux disease)    HTN (hypertension)    Hypercholesterolemia    Osteoarthritis of left hip 04/07/2014   Reflux    Severe aortic stenosis    Spinal headache    with C-Section and with spinal fusion in 2019    Tobacco Use: Social History   Tobacco Use  Smoking Status Never  Smokeless Tobacco Never    Labs: Review Flowsheet  More data exists      Latest Ref Rng & Units 02/03/2023 04/27/2023 04/28/2023 05/24/2023 06/14/2023  Labs for ITP Cardiac and Pulmonary Rehab  Cholestrol 100 - 199 mg/dL 161  - - - -  LDL (calc) 0 - 99 mg/dL 86  - - - -  Direct LDL 0 - 99 mg/dL - 096  - - -  HDL-C >04 mg/dL 43  - - - -  Trlycerides 0 - 149 mg/dL 540  - - - -  Hemoglobin A1c 4.8 - 5.6 % - - 6.2  5.9  -  PH, Arterial 7.35 - 7.45 - - - - 7.313  7.297  7.319  7.263  7.295  7.434  7.450  7.404   PCO2 arterial 32 - 48 mmHg - - - - 52.4  48.8  50.6  54.5  50.1  37.5  38.2  45.6   Bicarbonate 20.0 - 28.0 mmol/L - - - - 26.5  24.0  26.2  24.9  24.4  25.1  26.6  28.2  28.6   TCO2 22 - 32 mmol/L - - - - 28  25  28  27  23  26  25  26  26  28  27  30  30  27  26    Acid-base deficit 0.0 - 2.0 mmol/L - - - - 3.0  1.0  3.0  2.0   O2 Saturation % - - - - 98  98  97  87  100  100  100  63  100     Details       Multiple values from one day are sorted in reverse-chronological order         Capillary Blood Glucose: Lab Results  Component Value Date   GLUCAP 126 (H) 08/22/2023   GLUCAP 83 08/17/2023   GLUCAP 89 08/17/2023   GLUCAP 110 (H) 08/11/2023   GLUCAP 92 07/01/2023     Exercise Target Goals: Exercise Program Goal: Individual exercise prescription set using results from initial 6 min walk test and THRR while considering  patient's activity barriers and safety.  Exercise Prescription Goal: Initial exercise  prescription builds to 30-45 minutes a day of aerobic activity, 2-3 days per week.  Home exercise guidelines will be given to patient during program as part of exercise prescription that the participant will acknowledge.  Activity Barriers & Risk Stratification:  Activity Barriers & Cardiac Risk Stratification - 08/11/23 1421       Activity Barriers & Cardiac Risk Stratification   Activity Barriers Balance Concerns;Arthritis;Joint Problems;History of Falls;Back Problems;Deconditioning;Assistive Device;Muscular Weakness;Shortness of Breath;Neck/Spine Problems    Cardiac Risk Stratification High             6 Minute Walk:  6 Minute Walk     Row Name 08/11/23 1419         6 Minute Walk   Phase Initial  Pt used her rollator     Distance 25.53 feet     Walk Time 6 minutes     # of Rest Breaks 1  Pt stopped at 5:40, 20 seconds early     MPH 1.82     METS 2.15     RPE 13     Perceived Dyspnea  1     VO2 Peak 7.53     Symptoms Yes (comment)     Comments 5/10 right knee pain     Resting HR 83 bpm     Resting BP 132/70     Resting Oxygen Saturation  98 %     Exercise Oxygen Saturation  during 6 min walk 99 %     Max Ex. HR 105 bpm     Max Ex. BP 180/90     2 Minute Post BP 134/80              Oxygen Initial Assessment:   Oxygen Re-Evaluation:   Oxygen Discharge (Final Oxygen Re-Evaluation):   Initial Exercise Prescription:  Initial Exercise Prescription - 08/11/23 1400       Date of Initial Exercise RX and Referring Provider   Date 08/11/23    Referring Provider Charlton Haws, MD    Expected Discharge Date 11/02/23      NuStep   Level 3    SPM 75    Minutes 25    METs 2.1      Prescription Details   Frequency (times per week) 3    Duration Progress to 30 minutes of continuous aerobic without signs/symptoms of physical distress      Intensity   THRR 40-80% of Max Heartrate 62-123    Ratings of Perceived Exertion 11-13    Perceived Dyspnea 0-4       Progression   Progression Continue progressive overload as per policy without signs/symptoms or physical distress.      Resistance Training   Training Prescription Yes    Weight 2 lbs    Reps 10-15             Perform Capillary Blood Glucose checks as needed.  Exercise Prescription Changes:   Exercise Prescription Changes     Row Name 08/17/23 1024 08/22/23 1015           Response to Exercise   Blood Pressure (Admit) 118/80 118/80      Blood Pressure (Exercise) 166/80 132/80      Blood Pressure (Exit) 134/78 150/81      Heart Rate (Admit) 85 bpm 87 bpm      Heart Rate (Exercise) 94 bpm 98 bpm      Heart Rate (Exit) 81 bpm 90 bpm  Rating of Perceived Exertion (Exercise) 9 11      Symptoms None None      Comments Off to a good start with exercise. --      Duration Progress to 30 minutes of  aerobic without signs/symptoms of physical distress Continue with 30 min of aerobic exercise without signs/symptoms of physical distress.      Intensity THRR unchanged THRR unchanged        Progression   Progression Continue to progress workloads to maintain intensity without signs/symptoms of physical distress. Continue to progress workloads to maintain intensity without signs/symptoms of physical distress.      Average METs 1.6 1.5        Resistance Training   Training Prescription No Yes      Weight Relaxation day, no weights. 2 lbs      Reps -- 10-15      Time -- 10 Minutes        Interval Training   Interval Training No No        NuStep   Level 1 1      SPM 124 124      Minutes 25 30      METs 1.6 1.5               Exercise Comments:   Exercise Comments     Row Name 08/17/23 1130           Exercise Comments Paitent tolerated first exercise session well without symptoms. Oriented her to the stretching routine, modified to seated when needed.                Exercise Goals and Review:   Exercise Goals     Row Name 08/11/23 1422              Exercise Goals   Increase Physical Activity Yes       Intervention Provide advice, education, support and counseling about physical activity/exercise needs.;Develop an individualized exercise prescription for aerobic and resistive training based on initial evaluation findings, risk stratification, comorbidities and participant's personal goals.       Expected Outcomes Short Term: Attend rehab on a regular basis to increase amount of physical activity.;Long Term: Exercising regularly at least 3-5 days a week.;Long Term: Add in home exercise to make exercise part of routine and to increase amount of physical activity.       Increase Strength and Stamina Yes       Intervention Provide advice, education, support and counseling about physical activity/exercise needs.;Develop an individualized exercise prescription for aerobic and resistive training based on initial evaluation findings, risk stratification, comorbidities and participant's personal goals.       Expected Outcomes Short Term: Increase workloads from initial exercise prescription for resistance, speed, and METs.;Short Term: Perform resistance training exercises routinely during rehab and add in resistance training at home;Long Term: Improve cardiorespiratory fitness, muscular endurance and strength as measured by increased METs and functional capacity ( )       Able to understand and use rate of perceived exertion (RPE) scale Yes       Intervention Provide education and explanation on how to use RPE scale       Expected Outcomes Short Term: Able to use RPE daily in rehab to express subjective intensity level;Long Term:  Able to use RPE to guide intensity level when exercising independently       Knowledge and understanding of Target Heart Rate Range (THRR) Yes  Intervention Provide education and explanation of THRR including how the numbers were predicted and where they are located for reference       Expected Outcomes Short Term: Able  to state/look up THRR;Long Term: Able to use THRR to govern intensity when exercising independently;Short Term: Able to use daily as guideline for intensity in rehab       Understanding of Exercise Prescription Yes       Intervention Provide education, explanation, and written materials on patient's individual exercise prescription       Expected Outcomes Short Term: Able to explain program exercise prescription;Long Term: Able to explain home exercise prescription to exercise independently                Exercise Goals Re-Evaluation :  Exercise Goals Re-Evaluation     Row Name 08/17/23 1130             Exercise Goal Re-Evaluation   Exercise Goals Review Increase Physical Activity;Increase Strength and Stamina;Able to understand and use rate of perceived exertion (RPE) scale       Comments [place] was able to understand and use RPE scale appropriately.       Expected Outcomes Progress workloads as tolerated to help increase cardiorespiratory fitness.                Discharge Exercise Prescription (Final Exercise Prescription Changes):  Exercise Prescription Changes - 08/22/23 1015       Response to Exercise   Blood Pressure (Admit) 118/80    Blood Pressure (Exercise) 132/80    Blood Pressure (Exit) 150/81    Heart Rate (Admit) 87 bpm    Heart Rate (Exercise) 98 bpm    Heart Rate (Exit) 90 bpm    Rating of Perceived Exertion (Exercise) 11    Symptoms None    Duration Continue with 30 min of aerobic exercise without signs/symptoms of physical distress.    Intensity THRR unchanged      Progression   Progression Continue to progress workloads to maintain intensity without signs/symptoms of physical distress.    Average METs 1.5      Resistance Training   Training Prescription Yes    Weight 2 lbs    Reps 10-15    Time 10 Minutes      Interval Training   Interval Training No      NuStep   Level 1    SPM 124    Minutes 30    METs 1.5              Nutrition:  Target Goals: Understanding of nutrition guidelines, daily intake of sodium 1500mg , cholesterol 200mg , calories 30% from fat and 7% or less from saturated fats, daily to have 5 or more servings of fruits and vegetables.  Biometrics:  Pre Biometrics - 08/11/23 1105       Pre Biometrics   Waist Circumference 47 inches    Hip Circumference 51.25 inches    Waist to Hip Ratio 0.92 %    Triceps Skinfold 33 mm    % Body Fat 53.3 %    Grip Strength 17 kg    Flexibility --   Not performed, hx of back surgery x 4   Single Leg Stand 5.57 seconds              Nutrition Therapy Plan and Nutrition Goals:  Nutrition Therapy & Goals - 08/19/23 1057       Nutrition Therapy   Diet Heart Healthy Diet  Drug/Food Interactions Statins/Certain Fruits      Personal Nutrition Goals   Nutrition Goal Patient to identify strategies for reducing cardiovascular risk by attending the [employer] education and nutrition series weekly.    Personal Goal #2 Patient to improve diet quality by using the plate method as a guide for meal planning to include lean protein/plant protein, fruits, vegetables, whole grains, nonfat dairy as part of a well-balanced diet.    Personal Goal #3 Patient to identify strategies for weight loss of 0.5-2.0# per week.    Comments [place] has medical history of s/p AVR, chronic diastolic heart failure, PAF, HTN, DM2. LDL is not at goal (zetia, crestor). Her A1c has improved on increased dose of Ozempic (2mg ). She is motivated to lose weight. Patient will benefit from participation in intensive cardiac rehab for nutrition, exercise, and lifestyle modification.      Intervention Plan   Intervention Prescribe, educate and counsel regarding individualized specific dietary modifications aiming towards targeted core components such as weight, hypertension, lipid management, diabetes, heart failure and other comorbidities.;Nutrition handout(s) given to patient.     Expected Outcomes Short Term Goal: Understand basic principles of dietary content, such as calories, fat, sodium, cholesterol and nutrients.;Long Term Goal: Adherence to prescribed nutrition plan.             Nutrition Assessments:  MEDIFICTS Score Key: >=70 Need to make dietary changes  40-70 Heart Healthy Diet <= 40 Therapeutic Level Cholesterol Diet    Picture Your Plate Scores: <40 Unhealthy dietary pattern with much room for improvement. 41-50 Dietary pattern unlikely to meet recommendations for good health and room for improvement. 51-60 More healthful dietary pattern, with some room for improvement.  >60 Healthy dietary pattern, although there may be some specific behaviors that could be improved.    Nutrition Goals Re-Evaluation:  Nutrition Goals Re-Evaluation     Row Name 08/19/23 1057             Goals   Current Weight 200 lb 2.8 oz (90.8 kg)       Comment A1c 5.9, lipids WNL, direct LDL 106       Expected Outcome [place] has medical history of s/p AVR, chronic diastolic heart failure, PAF, HTN, DM2. LDL is not at goal (zetia, crestor). Her A1c has improved on increased dose of Ozempic (2mg ). She is motivated to lose weight. Patient will benefit from participation in intensive cardiac rehab for nutrition, exercise, and lifestyle modification.                Nutrition Goals Re-Evaluation:  Nutrition Goals Re-Evaluation     Row Name 08/19/23 1057             Goals   Current Weight 200 lb 2.8 oz (90.8 kg)       Comment A1c 5.9, lipids WNL, direct LDL 106       Expected Outcome [place] has medical history of s/p AVR, chronic diastolic heart failure, PAF, HTN, DM2. LDL is not at goal (zetia, crestor). Her A1c has improved on increased dose of Ozempic (2mg ). She is motivated to lose weight. Patient will benefit from participation in intensive cardiac rehab for nutrition, exercise, and lifestyle modification.                Nutrition Goals Discharge  (Final Nutrition Goals Re-Evaluation):  Nutrition Goals Re-Evaluation - 08/19/23 1057       Goals   Current Weight 200 lb 2.8 oz (90.8 kg)    Comment A1c  5.9, lipids WNL, direct LDL 106    Expected Outcome [place] has medical history of s/p AVR, chronic diastolic heart failure, PAF, HTN, DM2. LDL is not at goal (zetia, crestor). Her A1c has improved on increased dose of Ozempic (2mg ). She is motivated to lose weight. Patient will benefit from participation in intensive cardiac rehab for nutrition, exercise, and lifestyle modification.             Psychosocial: Target Goals: Acknowledge presence or absence of significant depression and/or stress, maximize coping skills, provide positive support system. Participant is able to verbalize types and ability to use techniques and skills needed for reducing stress and depression.  Initial Review & Psychosocial Screening:  Initial Psych Review & Screening - 08/11/23 1432       Initial Review   Current issues with Current Stress Concerns    Comments Pt voices a low level stress in regards to her recent surgery      Family Dynamics   Good Support System? Yes   Pt has husband for support     Barriers   Psychosocial barriers to participate in program The patient should benefit from training in stress management and relaxation.      Screening Interventions   Interventions Encouraged to exercise    Expected Outcomes Short Term goal: Utilizing psychosocial counselor, staff and physician to assist with identification of specific Stressors or current issues interfering with healing process. Setting desired goal for each stressor or current issue identified.;Long Term Goal: Stressors or current issues are controlled or eliminated.;Short Term goal: Identification and review with participant of any Quality of Life or Depression concerns found by scoring the questionnaire.;Long Term goal: The participant improves quality of Life and PHQ9 Scores as seen  by post scores and/or verbalization of changes             Quality of Life Scores:  Quality of Life - 08/11/23 1432       Quality of Life   Select Quality of Life      Quality of Life Scores   Health/Function Pre 25.53 %    Socioeconomic Pre 24.75 %    Psych/Spiritual Pre 25.29 %    Family Pre 27.3 %    GLOBAL Pre 25.61 %            Scores of 19 and below usually indicate a poorer quality of life in these areas.  A difference of  2-3 points is a clinically meaningful difference.  A difference of 2-3 points in the total score of the Quality of Life Index has been associated with significant improvement in overall quality of life, self-image, physical symptoms, and general health in studies assessing change in quality of life.  PHQ-9: Review Flowsheet  More data exists      08/11/2023 08/09/2023 04/26/2023 10/26/2022 09/30/2017  Depression screen PHQ 2/9  Decreased Interest 0 0 0 0 0  Down, Depressed, Hopeless 0 0 0 0 0  PHQ - 2 Score 0 0 0 0 0  Altered sleeping 0 - - - -  Tired, decreased energy 1 - - - -  Change in appetite 0 - - - -  Feeling bad or failure about yourself  0 - - - -  Trouble concentrating 0 - - - -  Moving slowly or fidgety/restless 0 - - - -  Suicidal thoughts 0 - - - -  PHQ-9 Score 1 - - - -  Difficult doing work/chores Not difficult at all - - - -  Interpretation of Total Score  Total Score Depression Severity:  1-4 = Minimal depression, 5-9 = Mild depression, 10-14 = Moderate depression, 15-19 = Moderately severe depression, 20-27 = Severe depression   Psychosocial Evaluation and Intervention:   Psychosocial Re-Evaluation:  Psychosocial Re-Evaluation     Row Name 08/18/23 0813             Psychosocial Re-Evaluation   Current issues with Current Stress Concerns       Comments Marcelino Duster started cardiac rehab on 08/17/23. Marcelino Duster did not not voice any increased concerns or stressors. Marcelino Duster admits to having low energy post open heart  surgery. Marcelino Duster hopes that that her energy level will improve participaitng in cardiac rehab.       Expected Outcomes Marcelino Duster will have decreaseed or contolled stress upon completion of cardiac rehab       Interventions Stress management education;Encouraged to attend Cardiac Rehabilitation for the exercise;Relaxation education       Continue Psychosocial Services  Follow up required by staff         Initial Review   Source of Stress Concerns Unable to participate in former interests or hobbies;Unable to perform yard/household activities;Chronic Illness       Comments Will continue to monitor and offer support as needed                Psychosocial Discharge (Final Psychosocial Re-Evaluation):  Psychosocial Re-Evaluation - 08/18/23 0813       Psychosocial Re-Evaluation   Current issues with Current Stress Concerns    Comments Marcelino Duster started cardiac rehab on 08/17/23. Marcelino Duster did not not voice any increased concerns or stressors. Marcelino Duster admits to having low energy post open heart surgery. Marcelino Duster hopes that that her energy level will improve participaitng in cardiac rehab.    Expected Outcomes Marcelino Duster will have decreaseed or contolled stress upon completion of cardiac rehab    Interventions Stress management education;Encouraged to attend Cardiac Rehabilitation for the exercise;Relaxation education    Continue Psychosocial Services  Follow up required by staff      Initial Review   Source of Stress Concerns Unable to participate in former interests or hobbies;Unable to perform yard/household activities;Chronic Illness    Comments Will continue to monitor and offer support as needed             Vocational Rehabilitation: Provide vocational rehab assistance to qualifying candidates.   Vocational Rehab Evaluation & Intervention:  Vocational Rehab - 08/11/23 1433       Initial Vocational Rehab Evaluation & Intervention   Assessment shows need for Vocational  Rehabilitation No   pt is retired            Education: Education Goals: Education classes will be provided on a weekly basis, covering required topics. Participant will state understanding/return demonstration of topics presented.    Education     Row Name 08/17/23 1300     Education   Cardiac Education Topics Pritikin   Orthoptist   Educator Dietitian   Weekly Topic Comforting Weekend Breakfasts   Instruction Review Code 1- Verbalizes Understanding   Class Start Time 1145   Class Stop Time 1228   Class Time Calculation (min) 43 min    Row Name 08/22/23 1100     Education   Cardiac Education Topics [employer]in   Psychologist, forensic Exercise Education   Exercise Education Biomechanial Limitations  Instruction Review Code 1- Verbalizes Understanding   Class Start Time 1145   Class Stop Time 1218   Class Time Calculation (min) 33 min            Core Videos: Exercise    Move It!  Clinical staff conducted group or individual video education with verbal and written material and guidebook.  Patient learns the recommended Pritikin exercise program. Exercise with the goal of living a long, healthy life. Some of the health benefits of exercise include controlled diabetes, healthier blood pressure levels, improved cholesterol levels, improved heart and lung capacity, improved sleep, and better body composition. Everyone should speak with their doctor before starting or changing an exercise routine.  Biomechanical Limitations Clinical staff conducted group or individual video education with verbal and written material and guidebook.  Patient learns how biomechanical limitations can impact exercise and how we can mitigate and possibly overcome limitations to have an impactful and balanced exercise routine.  Body Composition Clinical staff conducted group or individual video education  with verbal and written material and guidebook.  Patient learns that body composition (ratio of muscle mass to fat mass) is a key component to assessing overall fitness, rather than body weight alone. Increased fat mass, especially visceral belly fat, can put [place] at increased risk for metabolic syndrome, type 2 diabetes, heart disease, and even death. It is recommended to combine diet and exercise (cardiovascular and resistance training) to improve your body composition. Seek guidance from your physician and exercise physiologist before implementing an exercise routine.  Exercise Action Plan Clinical staff conducted group or individual video education with verbal and written material and guidebook.  Patient learns the recommended strategies to achieve and enjoy long-term exercise adherence, including variety, self-motivation, self-efficacy, and positive decision making. Benefits of exercise include fitness, good health, weight management, more energy, better sleep, less stress, and overall well-being.  Medical   Heart Disease Risk Reduction Clinical staff conducted group or individual video education with verbal and written material and guidebook.  Patient learns our heart is our most vital organ as it circulates oxygen, nutrients, white blood cells, and hormones throughout the entire body, and carries waste away. Data supports a plant-based eating plan like the Pritikin Program for its effectiveness in slowing progression of and reversing heart disease. The video provides a number of recommendations to address heart disease.   Metabolic Syndrome and Belly Fat  Clinical staff conducted group or individual video education with verbal and written material and guidebook.  Patient learns what metabolic syndrome is, how it leads to heart disease, and how one can reverse it and keep it from coming back. You have metabolic syndrome if you have 3 of the following 5 criteria: abdominal obesity, high blood  pressure, high triglycerides, low HDL cholesterol, and high blood sugar.  Hypertension and Heart Disease Clinical staff conducted group or individual video education with verbal and written material and guidebook.  Patient learns that high blood pressure, or hypertension, is very common in the [place]. Hypertension is largely due to excessive salt intake, but other important risk factors include being overweight, physical inactivity, drinking too much alcohol, smoking, and not eating enough potassium from fruits and vegetables. High blood pressure is a leading risk factor for heart attack, stroke, congestive heart failure, dementia, kidney failure, and premature death. Long-term effects of excessive salt intake include stiffening of the arteries and thickening of heart muscle and organ damage. Recommendations include ways to reduce hypertension and the risk of heart disease.  Diseases of Our Time - Focusing on Diabetes Clinical staff conducted group or individual video education with verbal and written material and guidebook.  Patient learns why the best way to stop diseases of our time is prevention, through food and other lifestyle changes. Medicine (such as prescription pills and surgeries) is often only a Band-Aid on the problem, not a long-term solution. Most common diseases of our time include obesity, type 2 diabetes, hypertension, heart disease, and cancer. The Pritikin Program is recommended and has been proven to help reduce, reverse, and/or prevent the damaging effects of metabolic syndrome.  Nutrition   Overview of the Pritikin Eating Plan  Clinical staff conducted group or individual video education with verbal and written material and guidebook.  Patient learns about the Pritikin Eating Plan for disease risk reduction. The Pritikin Eating Plan emphasizes a wide variety of unrefined, minimally-processed carbohydrates, like fruits, vegetables, whole grains, and legumes. Go, Caution,  and Stop food choices are explained. Plant-based and lean animal proteins are emphasized. Rationale provided for low sodium intake for blood pressure control, low added sugars for blood sugar stabilization, and low added fats and oils for coronary artery disease risk reduction and weight management.  Calorie Density  Clinical staff conducted group or individual video education with verbal and written material and guidebook.  Patient learns about calorie density and how it impacts the Pritikin Eating Plan. Knowing the characteristics of the food you choose will help you decide whether those foods will lead to weight gain or weight loss, and whether you want to consume more or less of them. Weight loss is usually a side effect of the Pritikin Eating Plan because of its focus on low calorie-dense foods.  Label Reading  Clinical staff conducted group or individual video education with verbal and written material and guidebook.  Patient learns about the Pritikin recommended label reading guidelines and corresponding recommendations regarding calorie density, added sugars, sodium content, and whole grains.  Dining Out - Part 1  Clinical staff conducted group or individual video education with verbal and written material and guidebook.  Patient learns that restaurant meals can be sabotaging because they can be so high in calories, fat, sodium, and/or sugar. Patient learns recommended strategies on how to positively address this and avoid unhealthy pitfalls.  Facts on Fats  Clinical staff conducted group or individual video education with verbal and written material and guidebook.  Patient learns that lifestyle modifications can be just as effective, if not more so, as many medications for lowering your risk of heart disease. A Pritikin lifestyle can help to reduce your risk of inflammation and atherosclerosis (cholesterol build-up, or plaque, in the artery walls). Lifestyle interventions such as dietary  choices and physical activity address the cause of atherosclerosis. A review of the types of fats and their impact on blood cholesterol levels, along with dietary recommendations to reduce fat intake is also included.  Nutrition Action Plan  Clinical staff conducted group or individual video education with verbal and written material and guidebook.  Patient learns how to incorporate Pritikin recommendations into their lifestyle. Recommendations include planning and keeping personal health goals in mind as an important part of their success.  Healthy Mind-Set    Healthy Minds, Bodies, Hearts  Clinical staff conducted group or individual video education with verbal and written material and guidebook.  Patient learns how to identify when they are stressed. Video will discuss the impact of that stress, as well as the many benefits of stress management. Patient will  also be introduced to stress management techniques. The way we think, act, and feel has an impact on our hearts.  How Our Thoughts Can Heal Our Hearts  Clinical staff conducted group or individual video education with verbal and written material and guidebook.  Patient learns that negative thoughts can cause depression and anxiety. This can result in negative lifestyle behavior and serious health problems. Cognitive behavioral therapy is an effective method to help control our thoughts in order to change and improve our emotional outlook.  Additional Videos:  Exercise    Improving Performance  Clinical staff conducted group or individual video education with verbal and written material and guidebook.  Patient learns to use a non-linear approach by alternating intensity levels and lengths of time spent exercising to help burn more calories and lose more body fat. Cardiovascular exercise helps improve heart health, metabolism, hormonal balance, blood sugar control, and recovery from fatigue. Resistance training improves strength, endurance,  balance, coordination, reaction time, metabolism, and muscle mass. Flexibility exercise improves circulation, posture, and balance. Seek guidance from your physician and exercise physiologist before implementing an exercise routine and learn your capabilities and proper form for all exercise.  Introduction to Yoga  Clinical staff conducted group or individual video education with verbal and written material and guidebook.  Patient learns about yoga, a discipline of the coming together of mind, breath, and body. The benefits of yoga include improved flexibility, improved range of motion, better posture and core strength, increased lung function, weight loss, and positive self-image. Yoga's heart health benefits include lowered blood pressure, healthier heart rate, decreased cholesterol and triglyceride levels, improved immune function, and reduced stress. Seek guidance from your physician and exercise physiologist before implementing an exercise routine and learn your capabilities and proper form for all exercise.  Medical   Aging: Enhancing Your Quality of Life  Clinical staff conducted group or individual video education with verbal and written material and guidebook.  Patient learns key strategies and recommendations to stay in good physical health and enhance quality of life, such as prevention strategies, having an advocate, securing a Health Care Proxy and Power of Attorney, and keeping a list of medications and system for tracking them. It also discusses how to avoid risk for bone loss.  Biology of Weight Control  Clinical staff conducted group or individual video education with verbal and written material and guidebook.  Patient learns that weight gain occurs because we consume more calories than we burn (eating more, moving less). Even if your body weight is normal, you may have higher ratios of fat compared to muscle mass. Too much body fat puts you at increased risk for cardiovascular disease,  heart attack, stroke, type 2 diabetes, and obesity-related cancers. In addition to exercise, following the Pritikin Eating Plan can help reduce your risk.  Decoding Lab Results  Clinical staff conducted group or individual video education with verbal and written material and guidebook.  Patient learns that lab test reflects one measurement whose values change over time and are influenced by many factors, including medication, stress, sleep, exercise, food, hydration, pre-existing medical conditions, and more. It is recommended to use the knowledge from this video to become more involved with your lab results and evaluate your numbers to speak with your doctor.   Diseases of Our Time - Overview  Clinical staff conducted group or individual video education with verbal and written material and guidebook.  Patient learns that according to the CDC, 50% to 70% of chronic diseases (such as  obesity, type 2 diabetes, elevated lipids, hypertension, and heart disease) are avoidable through lifestyle improvements including healthier food choices, listening to satiety cues, and increased physical activity.  Sleep Disorders Clinical staff conducted group or individual video education with verbal and written material and guidebook.  Patient learns how good quality and duration of sleep are important to overall health and well-being. Patient also learns about sleep disorders and how they impact health along with recommendations to address them, including discussing with a physician.  Nutrition  Dining Out - Part 2 Clinical staff conducted group or individual video education with verbal and written material and guidebook.  Patient learns how to plan ahead and communicate in order to maximize their dining experience in a healthy and nutritious manner. Included are recommended food choices based on the type of restaurant the patient is visiting.   Fueling a Banker conducted group or  individual video education with verbal and written material and guidebook.  There is a strong connection between our food choices and our health. Diseases like obesity and type 2 diabetes are very prevalent and are in large-part due to lifestyle choices. The Pritikin Eating Plan provides plenty of food and hunger-curbing satisfaction. It is easy to follow, affordable, and helps reduce health risks.  Menu Workshop  Clinical staff conducted group or individual video education with verbal and written material and guidebook.  Patient learns that restaurant meals can sabotage health goals because they are often packed with calories, fat, sodium, and sugar. Recommendations include strategies to plan ahead and to communicate with the manager, chef, or server to help order a healthier meal.  Planning Your Eating Strategy  Clinical staff conducted group or individual video education with verbal and written material and guidebook.  Patient learns about the Pritikin Eating Plan and its benefit of reducing the risk of disease. The Pritikin Eating Plan does not focus on calories. Instead, it emphasizes high-quality, nutrient-rich foods. By knowing the characteristics of the foods, we choose, we can determine their calorie density and make informed decisions.  Targeting Your Nutrition Priorities  Clinical staff conducted group or individual video education with verbal and written material and guidebook.  Patient learns that lifestyle habits have a tremendous impact on disease risk and progression. This video provides eating and physical activity recommendations based on your personal health goals, such as reducing LDL cholesterol, losing weight, preventing or controlling type 2 diabetes, and reducing high blood pressure.  Vitamins and Minerals  Clinical staff conducted group or individual video education with verbal and written material and guidebook.  Patient learns different ways to obtain key vitamins and  minerals, including through a recommended healthy diet. It is important to discuss all supplements you take with your doctor.   Healthy Mind-Set    Smoking Cessation  Clinical staff conducted group or individual video education with verbal and written material and guidebook.  Patient learns that cigarette smoking and tobacco addiction pose a serious health risk which affects millions of people. Stopping smoking will significantly reduce the risk of heart disease, lung disease, and many forms of cancer. Recommended strategies for quitting are covered, including working with your doctor to develop a successful plan.  Culinary   Becoming a Set designer conducted group or individual video education with verbal and written material and guidebook.  Patient learns that cooking at home can be healthy, cost-effective, quick, and puts them in control. Keys to cooking healthy recipes will include looking at your recipe,  assessing your equipment needs, planning ahead, making it simple, choosing cost-effective seasonal ingredients, and limiting the use of added fats, salts, and sugars.  Cooking - Breakfast and Snacks  Clinical staff conducted group or individual video education with verbal and written material and guidebook.  Patient learns how important breakfast is to satiety and nutrition through the entire day. Recommendations include key foods to eat during breakfast to help stabilize blood sugar levels and to prevent overeating at meals later in the day. Planning ahead is also a key component.  Cooking - Educational psychologist conducted group or individual video education with verbal and written material and guidebook.  Patient learns eating strategies to improve overall health, including an approach to cook more at home. Recommendations include thinking of animal protein as a side on your plate rather than center stage and focusing instead on lower calorie dense options like  vegetables, fruits, whole grains, and plant-based proteins, such as beans. Making sauces in large quantities to freeze for later and leaving the skin on your vegetables are also recommended to maximize your experience.  Cooking - Healthy Salads and Dressing Clinical staff conducted group or individual video education with verbal and written material and guidebook.  Patient learns that vegetables, fruits, whole grains, and legumes are the foundations of the Pritikin Eating Plan. Recommendations include how to incorporate each of these in flavorful and healthy salads, and how to create homemade salad dressings. Proper handling of ingredients is also covered. Cooking - Soups and [employer] - Soups and Desserts Clinical staff conducted group or individual video education with verbal and written material and guidebook.  Patient learns that Pritikin soups and desserts make for easy, nutritious, and delicious snacks and meal components that are low in sodium, fat, sugar, and calorie density, while high in vitamins, minerals, and filling fiber. Recommendations include simple and healthy ideas for soups and desserts.   Overview     The Pritikin Solution Program Overview Clinical staff conducted group or individual video education with verbal and written material and guidebook.  Patient learns that the results of the Pritikin Program have been documented in more than 100 articles published in peer-reviewed journals, and the benefits include reducing risk factors for (and, in some cases, even reversing) high cholesterol, high blood pressure, type 2 diabetes, obesity, and more! An overview of the three key pillars of the Pritikin Program will be covered: eating well, doing regular exercise, and having a healthy mind-set.  WORKSHOPS  Exercise: Exercise Basics: Building Your Action Plan Clinical staff led group instruction and group discussion with PowerPoint presentation and patient guidebook. To enhance  the learning environment the use of posters, models and videos may be added. At the conclusion of this workshop, patients will comprehend the difference between physical activity and exercise, as well as the benefits of incorporating both, into their routine. Patients will understand the FITT (Frequency, Intensity, Time, and Type) principle and how to use it to build an exercise action plan. In addition, safety concerns and other considerations for exercise and cardiac rehab will be addressed by the presenter. The purpose of this lesson is to promote a comprehensive and effective weekly exercise routine in order to improve patients' overall level of fitness.   Managing Heart Disease: Your Path to a Healthier Heart Clinical staff led group instruction and group discussion with PowerPoint presentation and patient guidebook. To enhance the learning environment the use of posters, models and videos may be added.At the conclusion of  this workshop, patients will understand the anatomy and physiology of the heart. Additionally, they will understand how Pritikin's three pillars impact the risk factors, the progression, and the management of heart disease.  The purpose of this lesson is to provide a high-level overview of the heart, heart disease, and how the Pritikin lifestyle positively impacts risk factors.  Exercise Biomechanics Clinical staff led group instruction and group discussion with PowerPoint presentation and patient guidebook. To enhance the learning environment the use of posters, models and videos may be added. Patients will learn how the structural parts of their bodies function and how these functions impact their daily activities, movement, and exercise. Patients will learn how to promote a neutral spine, learn how to manage pain, and identify ways to improve their physical movement in order to promote healthy living. The purpose of this lesson is to expose patients to common  physical limitations that impact physical activity. Participants will learn practical ways to adapt and manage aches and pains, and to minimize their effect on regular exercise. Patients will learn how to maintain good posture while sitting, walking, and lifting.  Balance Training and Fall Prevention  Clinical staff led group instruction and group discussion with PowerPoint presentation and patient guidebook. To enhance the learning environment the use of posters, models and videos may be added. At the conclusion of this workshop, patients will understand the importance of their sensorimotor skills (vision, proprioception, and the vestibular system) in maintaining their ability to balance as they age. Patients will apply a variety of balancing exercises that are appropriate for their current level of function. Patients will understand the common causes for poor balance, possible solutions to these problems, and ways to modify their physical environment in order to minimize their fall risk. The purpose of this lesson is to teach patients about the importance of maintaining balance as they age and ways to minimize their risk of falling.  WORKSHOPS   Nutrition:  Fueling a Ship broker led group instruction and group discussion with PowerPoint presentation and patient guidebook. To enhance the learning environment the use of posters, models and videos may be added. Patients will review the foundational principles of the Pritikin Eating Plan and understand what constitutes a serving size in each of the food groups. Patients will also learn Pritikin-friendly foods that are better choices when away from home and review make-ahead meal and snack options. Calorie density will be reviewed and applied to three nutrition priorities: weight maintenance, weight loss, and weight gain. The purpose of this lesson is to reinforce (in a group setting) the key concepts around what patients are  recommended to eat and how to apply these guidelines when away from home by planning and selecting Pritikin-friendly options. Patients will understand how calorie density may be adjusted for different weight management goals.  Mindful Eating  Clinical staff led group instruction and group discussion with PowerPoint presentation and patient guidebook. To enhance the learning environment the use of posters, models and videos may be added. Patients will briefly review the concepts of the Pritikin Eating Plan and the importance of low-calorie dense foods. The concept of mindful eating will be introduced as well as the importance of paying attention to internal hunger signals. Triggers for non-hunger eating and techniques for dealing with triggers will be explored. The purpose of this lesson is to provide patients with the opportunity to review the basic principles of the Pritikin Eating Plan, discuss the value of eating mindfully and how to measure internal  cues of hunger and fullness using the Hunger Scale. Patients will also discuss reasons for non-hunger eating and learn strategies to use for controlling emotional eating.  Targeting Your Nutrition Priorities Clinical staff led group instruction and group discussion with PowerPoint presentation and patient guidebook. To enhance the learning environment the use of posters, models and videos may be added. Patients will learn how to determine their genetic susceptibility to disease by reviewing their family history. Patients will gain insight into the importance of diet as part of an overall healthy lifestyle in mitigating the impact of genetics and other environmental insults. The purpose of this lesson is to provide patients with the opportunity to assess their personal nutrition priorities by looking at their family history, their own health history and current risk factors. Patients will also be able to discuss ways of prioritizing and modifying the Pritikin  Eating Plan for their highest risk areas  Menu  Clinical staff led group instruction and group discussion with PowerPoint presentation and patient guidebook. To enhance the learning environment the use of posters, models and videos may be added. Using menus brought in from [employer], or printed from [employer], patients will apply the Prit[facility]in dining out guidelines that were presented in the [employer] video. Patients will also be able to practice these guidelines in a variety of provided scenarios. The purpose of this lesson is to provide patients with the opportunity to practice hands-on learning of the P[employer]ikin Dining Out guidelines with actual menus and practice scenarios.  Label Reading Clinical staff led group instruction and group discussion with PowerPoint presentation and patient guidebook. To enhance the learning environment the use of posters, models and videos may be added. Patients will review and discuss the P[employer]in label reading guidelines presented in [employer]'s Label Reading Educational series video. Using fool labels brought in from local grocery stores and markets, patients will apply the label reading guidelines and determine if the packaged food meet the Pritikin guidelines. The purpose of this lesson is to provide patients with the opportunity to review, discuss, and practice hands-on learning of the [facility]in Label Reading guidelines with actual packaged food labels. Cooking School  [employer]'s [employer] are designed to teach patients ways to prepare quick, simple, and affordable recipes at home. The importance of nutrition's role in chronic disease risk reduction is reflected in its emphasis in the overall [facility]in program. By learning how to prepare essential core Pritikin Eating Plan recipes, patients will increase control over what they eat; be able to customize the flavor of foods without the use of added salt, sugar, or fat; and  improve the quality of the food they consume. By learning a set of core recipes which are easily assembled, quickly prepared, and affordable, patients are more likely to prepare more healthy foods at home. These workshops focus on convenient breakfasts, simple entres, side dishes, and desserts which can be prepared with minimal effort and are consistent with nutrition recommendations for cardiovascular risk reduction. Cooking [employer] are taught by a Armed forces logistics/support/administrative officer (RD) who has been trained by the [employer]. The chef or RD has a clear understanding of the importance of minimizing - if not completely eliminating - added fat, sugar, and sodium in recipes. Throughout the series of Cooking School Workshop sessions, patients will learn about healthy ingredients and efficient methods of cooking to build confidence in their capability to prepare    Cooking School weekly topics:  Adding Flavor- Sodium-Free  Fast  and Healthy Breakfasts  Powerhouse Plant-Based Proteins  Satisfying Salads and Dressings  Simple Sides and Sauces  International Cuisine-Spotlight on the Blue Zones  Delicious Desserts  Savory Soups  Efficiency Cooking - Meals in a Snap  Tasty Appetizers and Snacks  Comforting Weekend Breakfasts  One-Pot Wonders   Fast Evening Meals  Landscape architect Your Pritikin Plate  WORKSHOPS   Healthy Mindset (Psychosocial):  Focused Goals, Sustainable Changes Clinical staff led group instruction and group discussion with PowerPoint presentation and patient guidebook. To enhance the learning environment the use of posters, models and videos may be added. Patients will be able to apply effective goal setting strategies to establish at least one personal goal, and then take consistent, meaningful action toward that goal. They will learn to identify common barriers to achieving personal goals and develop strategies to overcome them. Patients will  also gain an understanding of how our mind-set can impact our ability to achieve goals and the importance of cultivating a positive and growth-oriented mind-set. The purpose of this lesson is to provide patients with a deeper understanding of how to set and achieve personal goals, as well as the tools and strategies needed to overcome common obstacles which may arise along the way.  From Head to Heart: The Power of a Healthy Outlook  Clinical staff led group instruction and group discussion with PowerPoint presentation and patient guidebook. To enhance the learning environment the use of posters, models and videos may be added. Patients will be able to recognize and describe the impact of emotions and mood on physical health. They will discover the importance of self-care and explore self-care practices which may work for them. Patients will also learn how to utilize the 4 C's to cultivate a healthier outlook and better manage stress and challenges. The purpose of this lesson is to demonstrate to patients how a healthy outlook is an essential part of maintaining good health, especially as they continue their cardiac rehab journey.  Healthy Sleep for a Healthy Heart Clinical staff led group instruction and group discussion with PowerPoint presentation and patient guidebook. To enhance the learning environment the use of posters, models and videos may be added. At the conclusion of this workshop, patients will be able to demonstrate knowledge of the importance of sleep to overall health, well-being, and quality of life. They will understand the symptoms of, and treatments for, common sleep disorders. Patients will also be able to identify daytime and nighttime behaviors which impact sleep, and they will be able to apply these tools to help manage sleep-related challenges. The purpose of this lesson is to provide patients with a general overview of sleep and outline the importance of quality sleep. Patients will  learn about a few of the most common sleep disorders. Patients will also be introduced to the concept of "sleep hygiene," and discover ways to self-manage certain sleeping problems through simple daily behavior changes. Finally, the workshop will motivate patients by clarifying the links between quality sleep and their goals of heart-healthy living.   Recognizing and Reducing Stress Clinical staff led group instruction and group discussion with PowerPoint presentation and patient guidebook. To enhance the learning environment the use of posters, models and videos may be added. At the conclusion of this workshop, patients will be able to understand the types of stress reactions, differentiate between acute and chronic stress, and recognize the impact that chronic stress has on their health. They will also be able to apply different coping mechanisms, such as  reframing negative self-talk. Patients will have the opportunity to practice a variety of stress management techniques, such as deep abdominal breathing, progressive muscle relaxation, and/or guided imagery.  The purpose of this lesson is to educate patients on the role of stress in their lives and to provide healthy techniques for coping with it.  Learning Barriers/Preferences:  Learning Barriers/Preferences - 08/11/23 1433       Learning Barriers/Preferences   Learning Barriers Sight   reading glasses   Learning Preferences Audio;Computer/Internet;Group Instruction;Individual Instruction;Pictoral;Skilled Demonstration;Verbal Instruction;Video;Written Material             Education Topics:  Knowledge Questionnaire Score:  Knowledge Questionnaire Score - 08/11/23 1433       Knowledge Questionnaire Score   Pre Score 21/24             Core Components/Risk Factors/Patient Goals at Admission:  Personal Goals and Risk Factors at Admission - 08/11/23 1433       Core Components/Risk Factors/Patient Goals on Admission    Weight  Management Yes;Obesity;Weight Loss    Intervention Weight Management: Develop a combined nutrition and exercise program designed to reach desired caloric intake, while maintaining appropriate intake of nutrient and fiber, sodium and fats, and appropriate energy expenditure required for the weight goal.;Weight Management: Provide education and appropriate resources to help participant work on and attain dietary goals.;Weight Management/Obesity: Establish reasonable short term and long term weight goals.;Obesity: Provide education and appropriate resources to help participant work on and attain dietary goals.    Expected Outcomes Short Term: Continue to assess and modify interventions until short term weight is achieved;Long Term: Adherence to nutrition and physical activity/exercise program aimed toward attainment of established weight goal;Weight Loss: Understanding of general recommendations for a balanced deficit meal plan, which promotes 1-2 lb weight loss per week and includes a negative energy balance of [phone] kcal/d;Understanding recommendations for meals to include 15-35% energy as protein, 25-35% energy from fat, 35-60% energy from carbohydrates, less than 200mg  of dietary cholesterol, 20-35 gm of total fiber daily;Understanding of distribution of calorie intake throughout the day with the consumption of 4-5 meals/snacks    Diabetes Yes    Intervention Provide education about signs/symptoms and action to take for hypo/hyperglycemia.;Provide education about proper nutrition, including hydration, and aerobic/resistive exercise prescription along with prescribed medications to achieve blood glucose in normal ranges: Fasting glucose 65-99 mg/dL    Expected Outcomes Short Term: Participant verbalizes understanding of the signs/symptoms and immediate care of hyper/hypoglycemia, proper foot care and importance of medication, aerobic/resistive exercise and nutrition plan for blood glucose control.;Long Term:  Attainment of HbA1C < 7%.    Hypertension Yes    Intervention Provide education on lifestyle modifcations including regular physical activity/exercise, weight management, moderate sodium restriction and increased consumption of fresh fruit, vegetables, and low fat dairy, alcohol moderation, and smoking cessation.;Monitor prescription use compliance.    Expected Outcomes Short Term: Continued assessment and intervention until BP is < 140/107mm HG in hypertensive participants. < 130/37mm HG in hypertensive participants with diabetes, heart failure or chronic kidney disease.;Long Term: Maintenance of blood pressure at goal levels.    Lipids Yes    Intervention Provide education and support for participant on nutrition & aerobic/resistive exercise along with prescribed medications to achieve LDL 70mg , HDL >40mg .    Expected Outcomes Short Term: Participant states understanding of desired cholesterol values and is compliant with medications prescribed. Participant is following exercise prescription and nutrition guidelines.;Long Term: Cholesterol controlled with medications as prescribed, with individualized exercise RX and  with personalized nutrition plan. Value goals: LDL < 70mg , HDL > 40 mg.    Stress Yes    Intervention Offer individual and/or small group education and counseling on adjustment to heart disease, stress management and health-related lifestyle change. Teach and support self-help strategies.;Refer participants experiencing significant psychosocial distress to appropriate mental health specialists for further evaluation and treatment. When possible, include family members and significant others in education/counseling sessions.    Expected Outcomes Short Term: Participant demonstrates changes in health-related behavior, relaxation and other stress management skills, ability to obtain effective social support, and compliance with psychotropic medications if prescribed.;Long Term: Emotional  wellbeing is indicated by absence of clinically significant psychosocial distress or social isolation.             Core Components/Risk Factors/Patient Goals Review:   Goals and Risk Factor Review     Row Name 08/18/23 0822             Core Components/Risk Factors/Patient Goals Review   Personal Goals Review Weight Management/Obesity;Hypertension;Lipids;Diabetes       Review Marcelino Duster started cardiac rehab on 08/17/23. Marcelino Duster did well with exercise for her fitness level. Marcelino Duster is somewhat deconditioned and uses a rollator for stability. Moderate exertional systolic Bp's noted. Will continue to monitor BP       Expected Outcomes Marcelino Duster will continue to participate in cardiac rehab for exercise, nutrition and lifestyle modifications                Core Components/Risk Factors/Patient Goals at Discharge (Final Review):   Goals and Risk Factor Review - 08/18/23 8119       Core Components/Risk Factors/Patient Goals Review   Personal Goals Review Weight Management/Obesity;Hypertension;Lipids;Diabetes    Review Marcelino Duster started cardiac rehab on 08/17/23. Marcelino Duster did well with exercise for her fitness level. Marcelino Duster is somewhat deconditioned and uses a rollator for stability. Moderate exertional systolic Bp's noted. Will continue to monitor BP    Expected Outcomes Marcelino Duster will continue to participate in cardiac rehab for exercise, nutrition and lifestyle modifications             ITP Comments:  ITP Comments     Row Name 08/11/23 1021 08/18/23 0812 08/23/23 0759       ITP Comments Rondell Reams, MD:  Medical Director.  Introduction to the [facility] Education Program/ Intensive Cardiac Rehab. Initial oreintation packet reviewed with the patient. 30 Day ITP Review. Marcelino Duster started cardiac rehab on 08/17/23. Marcelino Duster did well with exercise for her fitness level 30 Day ITP Review. Marcelino Duster started cardiac rehab on 08/17/23. Marcelino Duster is off to a good start  with exercise  for her fitness level              Comments: See ITP Comments

## 2023-08-24 ENCOUNTER — Encounter (HOSPITAL_COMMUNITY)
Admission: RE | Admit: 2023-08-24 | Discharge: 2023-08-24 | Disposition: A | Discharge status: Home / Self Care | Source: Ambulatory Visit | Attending: Cardiovascular Disease | Admitting: Cardiovascular Disease

## 2023-08-24 DIAGNOSIS — Z952 Presence of prosthetic heart valve: Secondary | ICD-10-CM | POA: Diagnosis present

## 2023-08-24 LAB — GLUCOSE, CAPILLARY: Glucose-Capillary: 82 mg/dL (ref 70–99)

## 2023-08-26 ENCOUNTER — Other Ambulatory Visit: Payer: Self-pay | Admitting: Internal Medicine

## 2023-08-29 ENCOUNTER — Encounter (HOSPITAL_COMMUNITY): Admission: RE | Admit: 2023-08-29 | Source: Ambulatory Visit

## 2023-08-30 ENCOUNTER — Telehealth (HOSPITAL_COMMUNITY): Payer: Self-pay | Admitting: *Deleted

## 2023-08-30 NOTE — Telephone Encounter (Signed)
 Left message to call cardiac rehab regarding absence on 08/29/23.Thayer Headings RN BSN

## 2023-08-31 ENCOUNTER — Encounter (HOSPITAL_COMMUNITY)
Admission: RE | Admit: 2023-08-31 | Discharge: 2023-08-31 | Disposition: A | Discharge status: Home / Self Care | Source: Ambulatory Visit | Attending: Cardiovascular Disease | Admitting: Cardiovascular Disease

## 2023-08-31 DIAGNOSIS — Z952 Presence of prosthetic heart valve: Secondary | ICD-10-CM | POA: Diagnosis not present

## 2023-08-31 LAB — GLUCOSE, CAPILLARY: Glucose-Capillary: 108 mg/dL — ABNORMAL HIGH (ref 70–99)

## 2023-09-01 ENCOUNTER — Other Ambulatory Visit: Payer: Self-pay | Admitting: Cardiovascular Disease

## 2023-09-01 MED ORDER — FUROSEMIDE 40 MG PO TABS: 40.0000 mg | ORAL_TABLET | Freq: Every day | ORAL | 3 refills | Status: DC | PRN

## 2023-09-05 ENCOUNTER — Encounter (HOSPITAL_COMMUNITY)
Admission: RE | Admit: 2023-09-05 | Discharge: 2023-09-05 | Disposition: A | Discharge status: Home / Self Care | Source: Ambulatory Visit | Attending: Cardiovascular Disease | Admitting: Cardiovascular Disease

## 2023-09-05 DIAGNOSIS — Z952 Presence of prosthetic heart valve: Secondary | ICD-10-CM

## 2023-09-07 ENCOUNTER — Encounter (HOSPITAL_COMMUNITY)
Admission: RE | Admit: 2023-09-07 | Discharge: 2023-09-07 | Disposition: A | Discharge status: Home / Self Care | Source: Ambulatory Visit | Attending: Cardiovascular Disease | Admitting: Cardiovascular Disease

## 2023-09-07 DIAGNOSIS — Z952 Presence of prosthetic heart valve: Secondary | ICD-10-CM | POA: Diagnosis not present

## 2023-09-09 ENCOUNTER — Other Ambulatory Visit (HOSPITAL_COMMUNITY): Payer: Self-pay

## 2023-09-09 ENCOUNTER — Telehealth: Payer: Self-pay | Admitting: Cardiovascular Disease

## 2023-09-09 MED ORDER — APIXABAN 5 MG PO TABS
5.0000 mg | ORAL_TABLET | Freq: Two times a day (BID) | ORAL | 3 refills | Status: DC
  Filled 2023-09-09: qty 60, 30d supply, fill #0
  Filled 2023-10-12: qty 60, 30d supply, fill #1
  Filled 2023-11-20: qty 60, 30d supply, fill #2
  Filled 2023-12-19: qty 60, 30d supply, fill #3

## 2023-09-09 NOTE — Progress Notes (Signed)
 Systolic Resting BP's noted in the 150's the last two sessions at cardiac rehab. Weight  basically unchanged at 90.9 kg.  Medications are being taking as prescribed. [name]  " [name]" said that since she was changed from coreg  to metoprolol . [name] has been having some higher blood pressure reading recently. Will forward vital signs from cardiac rehab for Dr Stann Earnest for review.Monte Antonio RN BSN

## 2023-09-09 NOTE — Telephone Encounter (Signed)
 Pt c/o medication issue:  1. Name of Medication:  apixaban  (ELIQUIS ) 5 MG TABS tablet   2. How are you currently taking this medication (dosage and times per day)?   As prescribed  3. Are you having a reaction (difficulty breathing--STAT)?   No  4. What is your medication issue?    Patient wants to know if she should continue on this medication.  Patient noted she only has 3 tablets left and if she needs to continue patient wants refill sent to [facility].

## 2023-09-09 NOTE — Telephone Encounter (Signed)
 Pt aware refill request sent to pharmacy as requested ./cy

## 2023-09-12 ENCOUNTER — Encounter (HOSPITAL_COMMUNITY)
Admission: RE | Admit: 2023-09-12 | Discharge: 2023-09-12 | Disposition: A | Discharge status: Home / Self Care | Source: Ambulatory Visit | Attending: Cardiovascular Disease | Admitting: Cardiovascular Disease

## 2023-09-12 DIAGNOSIS — Z952 Presence of prosthetic heart valve: Secondary | ICD-10-CM

## 2023-09-13 NOTE — Progress Notes (Signed)
 Cardiac Individual Treatment Plan  Patient Details  Name: Azuree  LARHONDA DETTLOFF MRN: [id number] Date of Birth: 1955/06/19 Referring Provider:   Flowsheet Row INTENSIVE CARDIAC REHAB ORIENT from 08/11/2023 in [facility]  Referring Provider Janelle Mediate, MD       Initial Encounter Date:  Flowsheet Row INTENSIVE CARDIAC REHAB ORIENT from 08/11/2023 in [facility]  Date 08/11/23       Visit Diagnosis: 06/14/23 S/P AVR (aortic valve replacement)  Patient's Home Medications on Admission:  Current Outpatient Medications:    acetaminophen  (TYLENOL ) 500 MG tablet, Take 1,000 mg by mouth every 6 (six) hours as needed for mild pain (pain score 1-3) or headache., Disp: , Rfl:    albuterol  (PROAIR  HFA) 108 (90 BASE) MCG/ACT inhaler, Inhale 2 puffs into the lungs every 6 (six) hours as needed., Disp: 1 Inhaler, Rfl: 12   amiodarone  (PACERONE ) 200 MG tablet, Take 1 tablet (200 mg total) by mouth daily., Disp: 90 tablet, Rfl: 3   amLODipine  (NORVASC ) 10 MG tablet, Take 1 tablet (10 mg total) by mouth daily., Disp: 90 tablet, Rfl: 1   amoxicillin  (AMOXIL ) 500 MG tablet, Take 4 tablets (2000 mg) by mouth one hour prior to dental procedure. (Patient not taking: Reported on 08/11/2023), Disp: 4 tablet, Rfl: 0   apixaban  (ELIQUIS ) 5 MG TABS tablet, Take 1 tablet (5 mg total) by mouth 2 (two) times daily., Disp: 60 tablet, Rfl: 3   ascorbic acid (VITAMIN C) 500 MG tablet, Take 500 mg by mouth daily., Disp: , Rfl:    aspirin  EC 81 MG tablet, Take 1 tablet (81 mg total) by mouth daily. Swallow whole., Disp: , Rfl:    cetirizine  (ZYRTEC ) 10 MG tablet, Take 1 tablet (10 mg total) by mouth daily., Disp: 30 tablet, Rfl: 0   docusate sodium  (COLACE) 100 MG capsule, Take 200-300 mg by mouth at bedtime., Disp: , Rfl:    ezetimibe  (ZETIA ) 10 MG tablet, Take 1 tablet (10 mg total) by mouth daily., Disp: 30  tablet, Rfl: 1   fluticasone  (FLONASE ) 50 MCG/ACT nasal spray, Place 2 sprays into both nostrils daily. (Patient taking differently: Place 2 sprays into both nostrils daily as needed for rhinitis.), Disp: 16 g, Rfl: 0   furosemide  (LASIX ) 40 MG tablet, Take 1 tablet (40 mg total) by mouth daily as needed for edema or fluid (shortness of breath)., Disp: 90 tablet, Rfl: 3   gabapentin  (NEURONTIN ) 600 MG tablet, Take 600 mg by mouth 3 (three) times daily., Disp: , Rfl:    iron  polysaccharides (NIFEREX) 150 MG capsule, TAKE 1 CAPSULE (150 MG DOSE) BY MOUTH TWICE A DAY (Patient taking differently: Take 150 mg by mouth daily.), Disp: 180 capsule, Rfl: 1   losartan  (COZAAR ) 100 MG tablet, Take 1 tablet (100 mg total) by mouth daily., Disp: 30 tablet, Rfl: 1   [Paused] metFORMIN  (GLUCOPHAGE ) 1000 MG tablet, Take 1 tablet (1,000 mg total) by mouth 2 (two) times daily with a meal. (Patient not taking: Reported on 08/09/2023), Disp: 180 tablet, Rfl: 1   metoprolol  tartrate (LOPRESSOR ) 25 MG tablet, Take 1 tablet (25 mg total) by mouth 2 (two) times daily., Disp: 180 tablet, Rfl: 3   ondansetron  (ZOFRAN ) 8 MG tablet, Take 8 mg by mouth 3 (three) times daily., Disp: , Rfl:    pantoprazole  (PROTONIX ) 40 MG tablet, Take 40 mg by mouth daily., Disp: , Rfl:  polyvinyl alcohol (LIQUIFILM TEARS) 1.4 % ophthalmic solution, Place 1 drop into both eyes as needed for dry eyes., Disp: , Rfl:    rosuvastatin  (CRESTOR ) 10 MG tablet, Take 1 tablet (10 mg total) by mouth every other day., Disp: 45 tablet, Rfl: 3   Semaglutide , 2 MG/DOSE, (OZEMPIC , 2 MG/DOSE,) 8 MG/3ML SOPN, Inject 2 mg into the skin once a week., Disp: , Rfl:    sulfamethoxazole -trimethoprim  (BACTRIM ) 400-80 MG tablet, TAKE 1 TABLET BY MOUTH TWICE DAILY, Disp: 60 tablet, Rfl: 1  Past Medical History: Past Medical History:  Diagnosis Date   Allergy    Anemia    Anxiety    Arthritis    Asthma    BMI 40.0-44.9, adult (HCC) 04/07/2014   DM type 2 (diabetes  mellitus, type 2) (HCC)    GERD (gastroesophageal reflux disease)    HTN (hypertension)    Hypercholesterolemia    Osteoarthritis of left hip 04/07/2014   Reflux    Severe aortic stenosis    Spinal headache    with C-Section and with spinal fusion in 2019    Tobacco Use: Social History   Tobacco Use  Smoking Status Never  Smokeless Tobacco Never    Labs: Review Flowsheet  More data exists      Latest Ref Rng & Units 02/03/2023 04/27/2023 04/28/2023 05/24/2023 06/14/2023  Labs for ITP Cardiac and Pulmonary Rehab  Cholestrol 100 - 199 mg/dL 161  - - - -  LDL (calc) 0 - 99 mg/dL 86  - - - -  Direct LDL 0 - 99 mg/dL - 096  - - -  HDL-C >04 mg/dL 43  - - - -  Trlycerides 0 - 149 mg/dL 540  - - - -  Hemoglobin A1c 4.8 - 5.6 % - - 6.2  5.9  -  PH, Arterial 7.35 - 7.45 - - - - 7.313  7.297  7.319  7.263  7.295  7.434  7.450  7.404   PCO2 arterial 32 - 48 mmHg - - - - 52.4  48.8  50.6  54.5  50.1  37.5  38.2  45.6   Bicarbonate 20.0 - 28.0 mmol/L - - - - 26.5  24.0  26.2  24.9  24.4  25.1  26.6  28.2  28.6   TCO2 22 - 32 mmol/L - - - - 28  25  28  27  23  26  25  26  26  28  27  30  30  27  26    Acid-base deficit 0.0 - 2.0 mmol/L - - - - 3.0  1.0  3.0  2.0   O2 Saturation % - - - - 98  98  97  87  100  100  100  63  100     Details       Multiple values from one day are sorted in reverse-chronological order         Capillary Blood Glucose: Lab Results  Component Value Date   GLUCAP 108 (H) 08/31/2023   GLUCAP 82 08/24/2023   GLUCAP 126 (H) 08/22/2023   GLUCAP 83 08/17/2023   GLUCAP 89 08/17/2023     Exercise Target Goals: Exercise Program Goal: Individual exercise prescription set using results from initial 6 min walk test and THRR while considering  patient's activity barriers and safety.   Exercise Prescription Goal: Initial exercise prescription builds to 30-45 minutes a day of aerobic activity, 2-3 days per week.  Home exercise guidelines will  be given to patient  during program as part of exercise prescription that the participant will acknowledge.  Activity Barriers & Risk Stratification:  Activity Barriers & Cardiac Risk Stratification - 08/11/23 1421       Activity Barriers & Cardiac Risk Stratification   Activity Barriers Balance Concerns;Arthritis;Joint Problems;History of Falls;Back Problems;Deconditioning;Assistive Device;Muscular Weakness;Shortness of Breath;Neck/Spine Problems    Cardiac Risk Stratification High             6 Minute Walk:  6 Minute Walk     Row Name 08/11/23 1419         6 Minute Walk   Phase Initial  Pt used her rollator     Distance 25.53 feet     Walk Time 6 minutes     # of Rest Breaks 1  Pt stopped at 5:40, 20 seconds early     MPH 1.82     METS 2.15     RPE 13     Perceived Dyspnea  1     VO2 Peak 7.53     Symptoms Yes (comment)     Comments 5/10 right knee pain     Resting HR 83 bpm     Resting BP 132/70     Resting Oxygen Saturation  98 %     Exercise Oxygen Saturation  during 6 min walk 99 %     Max Ex. HR 105 bpm     Max Ex. BP 180/90     2 Minute Post BP 134/80              Oxygen Initial Assessment:   Oxygen Re-Evaluation:   Oxygen Discharge (Final Oxygen Re-Evaluation):   Initial Exercise Prescription:  Initial Exercise Prescription - 08/11/23 1400       Date of Initial Exercise RX and Referring Provider   Date 08/11/23    Referring Provider Janelle Mediate, MD    Expected Discharge Date 11/02/23      NuStep   Level 3    SPM 75    Minutes 25    METs 2.1      Prescription Details   Frequency (times per week) 3    Duration Progress to 30 minutes of continuous aerobic without signs/symptoms of physical distress      Intensity   THRR 40-80% of Max Heartrate 62-123    Ratings of Perceived Exertion 11-13    Perceived Dyspnea 0-4      Progression   Progression Continue progressive overload as per policy without signs/symptoms or physical distress.      Resistance  Training   Training Prescription Yes    Weight 2 lbs    Reps 10-15             Perform Capillary Blood Glucose checks as needed.  Exercise Prescription Changes:   Exercise Prescription Changes     Row Name 08/17/23 1024 08/22/23 1015 09/05/23 1031         Response to Exercise   Blood Pressure (Admit) 118/80 118/80 156/82     Blood Pressure (Exercise) 166/80 132/80 158/80     Blood Pressure (Exit) 134/78 150/81 118/76     Heart Rate (Admit) 85 bpm 87 bpm 82 bpm     Heart Rate (Exercise) 94 bpm 98 bpm 107 bpm     Heart Rate (Exit) 81 bpm 90 bpm 91 bpm     Rating of Perceived Exertion (Exercise) 9 11 11      Symptoms None None None  Comments Off to a good start with exercise. -- Will increase workload on NuStep next session from 3 to 4.     Duration Progress to 30 minutes of  aerobic without signs/symptoms of physical distress Continue with 30 min of aerobic exercise without signs/symptoms of physical distress. Continue with 30 min of aerobic exercise without signs/symptoms of physical distress.     Intensity THRR unchanged THRR unchanged THRR unchanged       Progression   Progression Continue to progress workloads to maintain intensity without signs/symptoms of physical distress. Continue to progress workloads to maintain intensity without signs/symptoms of physical distress. Continue to progress workloads to maintain intensity without signs/symptoms of physical distress.     Average METs 1.6 1.5 1.5       Resistance Training   Training Prescription No Yes Yes     Weight Relaxation day, no weights. 2 lbs 2 lbs     Reps -- 10-15 10-15     Time -- 10 Minutes 10 Minutes       Interval Training   Interval Training No No No       NuStep   Level 1 1 3      SPM 124 124 131     Minutes 25 30 30      METs 1.6 1.5 1.5              Exercise Comments:   Exercise Comments     Row Name 08/17/23 1130           Exercise Comments Paitent tolerated first exercise  session well without symptoms. Oriented her to the stretching routine, modified to seated when needed.                Exercise Goals and Review:   Exercise Goals     Row Name 08/11/23 1422             Exercise Goals   Increase Physical Activity Yes       Intervention Provide advice, education, support and counseling about physical activity/exercise needs.;Develop an individualized exercise prescription for aerobic and resistive training based on initial evaluation findings, risk stratification, comorbidities and participant's personal goals.       Expected Outcomes Short Term: Attend rehab on a regular basis to increase amount of physical activity.;Long Term: Exercising regularly at least 3-5 days a week.;Long Term: Add in home exercise to make exercise part of routine and to increase amount of physical activity.       Increase Strength and Stamina Yes       Intervention Provide advice, education, support and counseling about physical activity/exercise needs.;Develop an individualized exercise prescription for aerobic and resistive training based on initial evaluation findings, risk stratification, comorbidities and participant's personal goals.       Expected Outcomes Short Term: Increase workloads from initial exercise prescription for resistance, speed, and METs.;Short Term: Perform resistance training exercises routinely during rehab and add in resistance training at home;Long Term: Improve cardiorespiratory fitness, muscular endurance and strength as measured by increased METs and functional capacity ( )       Able to understand and use rate of perceived exertion (RPE) scale Yes       Intervention Provide education and explanation on how to use RPE scale       Expected Outcomes Short Term: Able to use RPE daily in rehab to express subjective intensity level;Long Term:  Able to use RPE to guide intensity level when exercising independently  Knowledge and understanding of  Target Heart Rate Range (THRR) Yes       Intervention Provide education and explanation of THRR including how the numbers were predicted and where they are located for reference       Expected Outcomes Short Term: Able to state/look up THRR;Long Term: Able to use THRR to govern intensity when exercising independently;Short Term: Able to use daily as guideline for intensity in rehab       Understanding of Exercise Prescription Yes       Intervention Provide education, explanation, and written materials on patient's individual exercise prescription       Expected Outcomes Short Term: Able to explain program exercise prescription;Long Term: Able to explain home exercise prescription to exercise independently                Exercise Goals Re-Evaluation :  Exercise Goals Re-Evaluation     Row Name 08/17/23 1130             Exercise Goal Re-Evaluation   Exercise Goals Review Increase Physical Activity;Increase Strength and Stamina;Able to understand and use rate of perceived exertion (RPE) scale       Comments Jaonna  was able to understand and use RPE scale appropriately.       Expected Outcomes Progress workloads as tolerated to help increase cardiorespiratory fitness.                Discharge Exercise Prescription (Final Exercise Prescription Changes):  Exercise Prescription Changes - 09/05/23 1031       Response to Exercise   Blood Pressure (Admit) 156/82    Blood Pressure (Exercise) 158/80    Blood Pressure (Exit) 118/76    Heart Rate (Admit) 82 bpm    Heart Rate (Exercise) 107 bpm    Heart Rate (Exit) 91 bpm    Rating of Perceived Exertion (Exercise) 11    Symptoms None    Comments Will increase workload on NuStep next session from 3 to 4.    Duration Continue with 30 min of aerobic exercise without signs/symptoms of physical distress.    Intensity THRR unchanged      Progression   Progression Continue to progress workloads to maintain intensity without  signs/symptoms of physical distress.    Average METs 1.5      Resistance Training   Training Prescription Yes    Weight 2 lbs    Reps 10-15    Time 10 Minutes      Interval Training   Interval Training No      NuStep   Level 3    SPM 131    Minutes 30    METs 1.5             Nutrition:  Target Goals: Understanding of nutrition guidelines, daily intake of sodium 1500mg , cholesterol 200mg , calories 30% from fat and 7% or less from saturated fats, daily to have 5 or more servings of fruits and vegetables.  Biometrics:  Pre Biometrics - 08/11/23 1105       Pre Biometrics   Waist Circumference 47 inches    Hip Circumference 51.25 inches    Waist to Hip Ratio 0.92 %    Triceps Skinfold 33 mm    % Body Fat 53.3 %    Grip Strength 17 kg    Flexibility --   Not performed, hx of back surgery x 4   Single Leg Stand 5.57 seconds  Nutrition Therapy Plan and Nutrition Goals:  Nutrition Therapy & Goals - 08/19/23 1057       Nutrition Therapy   Diet Heart Healthy Diet    Drug/Food Interactions Statins/Certain Fruits      Personal Nutrition Goals   Nutrition Goal Patient to identify strategies for reducing cardiovascular risk by attending the [employer] education and nutrition series weekly.    Personal Goal #2 Patient to improve diet quality by using the plate method as a guide for meal planning to include lean protein/plant protein, fruits, vegetables, whole grains, nonfat dairy as part of a well-balanced diet.    Personal Goal #3 Patient to identify strategies for weight loss of 0.5-2.0# per week.    Comments Suheyla  has medical history of s/p AVR, chronic diastolic heart failure, PAF, HTN, DM2. LDL is not at goal (zetia , crestor ). Her A1c has improved on increased dose of Ozempic  (2mg ). She is motivated to lose weight. Patient will benefit from participation in intensive cardiac rehab for nutrition, exercise, and lifestyle modification.      Intervention  Plan   Intervention Prescribe, educate and counsel regarding individualized specific dietary modifications aiming towards targeted core components such as weight, hypertension, lipid management, diabetes, heart failure and other comorbidities.;Nutrition handout(s) given to patient.    Expected Outcomes Short Term Goal: Understand basic principles of dietary content, such as calories, fat, sodium, cholesterol and nutrients.;Long Term Goal: Adherence to prescribed nutrition plan.             Nutrition Assessments:  MEDIFICTS Score Key: >=70 Need to make dietary changes  40-70 Heart Healthy Diet <= 40 Therapeutic Level Cholesterol Diet    Picture Your Plate Scores: <19 Unhealthy dietary pattern with much room for improvement. 41-50 Dietary pattern unlikely to meet recommendations for good health and room for improvement. 51-60 More healthful dietary pattern, with some room for improvement.  >60 Healthy dietary pattern, although there may be some specific behaviors that could be improved.    Nutrition Goals Re-Evaluation:  Nutrition Goals Re-Evaluation     Row Name 08/19/23 1057             Goals   Current Weight 200 lb 2.8 oz (90.8 kg)       Comment A1c 5.9, lipids WNL, direct LDL 106       Expected Outcome Sandrine  has medical history of s/p AVR, chronic diastolic heart failure, PAF, HTN, DM2. LDL is not at goal (zetia , crestor ). Her A1c has improved on increased dose of Ozempic  (2mg ). She is motivated to lose weight. Patient will benefit from participation in intensive cardiac rehab for nutrition, exercise, and lifestyle modification.                Nutrition Goals Re-Evaluation:  Nutrition Goals Re-Evaluation     Row Name 08/19/23 1057             Goals   Current Weight 200 lb 2.8 oz (90.8 kg)       Comment A1c 5.9, lipids WNL, direct LDL 106       Expected Outcome Azka  has medical history of s/p AVR, chronic diastolic heart failure, PAF, HTN, DM2. LDL is  not at goal (zetia , crestor ). Her A1c has improved on increased dose of Ozempic  (2mg ). She is motivated to lose weight. Patient will benefit from participation in intensive cardiac rehab for nutrition, exercise, and lifestyle modification.                Nutrition Goals Discharge (Final Nutrition  Goals Re-Evaluation):  Nutrition Goals Re-Evaluation - 08/19/23 1057       Goals   Current Weight 200 lb 2.8 oz (90.8 kg)    Comment A1c 5.9, lipids WNL, direct LDL 106    Expected Outcome Everlean  has medical history of s/p AVR, chronic diastolic heart failure, PAF, HTN, DM2. LDL is not at goal (zetia , crestor ). Her A1c has improved on increased dose of Ozempic  (2mg ). She is motivated to lose weight. Patient will benefit from participation in intensive cardiac rehab for nutrition, exercise, and lifestyle modification.             Psychosocial: Target Goals: Acknowledge presence or absence of significant depression and/or stress, maximize coping skills, provide positive support system. Participant is able to verbalize types and ability to use techniques and skills needed for reducing stress and depression.  Initial Review & Psychosocial Screening:  Initial Psych Review & Screening - 08/11/23 1432       Initial Review   Current issues with Current Stress Concerns    Comments Pt voices a low level stress in regards to her recent surgery      Family Dynamics   Good Support System? Yes   Pt has husband for support     Barriers   Psychosocial barriers to participate in program The patient should benefit from training in stress management and relaxation.      Screening Interventions   Interventions Encouraged to exercise    Expected Outcomes Short Term goal: Utilizing psychosocial counselor, staff and physician to assist with identification of specific Stressors or current issues interfering with healing process. Setting desired goal for each stressor or current issue identified.;Long  Term Goal: Stressors or current issues are controlled or eliminated.;Short Term goal: Identification and review with participant of any Quality of Life or Depression concerns found by scoring the questionnaire.;Long Term goal: The participant improves quality of Life and PHQ9 Scores as seen by post scores and/or verbalization of changes             Quality of Life Scores:  Quality of Life - 08/11/23 1432       Quality of Life   Select Quality of Life      Quality of Life Scores   Health/Function Pre 25.53 %    Socioeconomic Pre 24.75 %    Psych/Spiritual Pre 25.29 %    Family Pre 27.3 %    GLOBAL Pre 25.61 %            Scores of 19 and below usually indicate a poorer quality of life in these areas.  A difference of  2-3 points is a clinically meaningful difference.  A difference of 2-3 points in the total score of the Quality of Life Index has been associated with significant improvement in overall quality of life, self-image, physical symptoms, and general health in studies assessing change in quality of life.  PHQ-9: Review Flowsheet  More data exists      08/11/2023 08/09/2023 04/26/2023 10/26/2022 09/30/2017  Depression screen PHQ 2/9  Decreased Interest 0 0 0 0 0  Down, Depressed, Hopeless 0 0 0 0 0  PHQ - 2 Score 0 0 0 0 0  Altered sleeping 0 - - - -  Tired, decreased energy 1 - - - -  Change in appetite 0 - - - -  Feeling bad or failure about yourself  0 - - - -  Trouble concentrating 0 - - - -  Moving slowly or fidgety/restless 0 - - - -  Suicidal thoughts 0 - - - -  PHQ-9 Score 1 - - - -  Difficult doing work/chores Not difficult at all - - - -   Interpretation of Total Score  Total Score Depression Severity:  1-4 = Minimal depression, 5-9 = Mild depression, 10-14 = Moderate depression, 15-19 = Moderately severe depression, 20-27 = Severe depression   Psychosocial Evaluation and Intervention:   Psychosocial Re-Evaluation:  Psychosocial Re-Evaluation      Row Name 08/18/23 0813 09/09/23 0830           Psychosocial Re-Evaluation   Current issues with Current Stress Concerns Current Stress Concerns      Comments Moira Andrews started cardiac rehab on 08/17/23. Moira Andrews did not not voice any increased concerns or stressors. Moira Andrews admits to having low energy post open heart surgery. Moira Andrews hopes that that her energy level will improve participaitng in cardiac rehab. Moira Andrews has not voiced any increased concerns or stressors during exercise at cardiac rehab.      Expected Outcomes Moira Andrews will have decreaseed or contolled stress upon completion of cardiac rehab Moira Andrews will have decreaseed or contolled stress upon completion of cardiac rehab      Interventions Stress management education;Encouraged to attend Cardiac Rehabilitation for the exercise;Relaxation education Stress management education;Encouraged to attend Cardiac Rehabilitation for the exercise;Relaxation education      Continue Psychosocial Services  Follow up required by staff No Follow up required        Initial Review   Source of Stress Concerns Unable to participate in former interests or hobbies;Unable to perform yard/household activities;Chronic Illness Unable to participate in former interests or hobbies;Unable to perform yard/household activities;Chronic Illness      Comments Will continue to monitor and offer support as needed Will continue to monitor and offer support as needed               Psychosocial Discharge (Final Psychosocial Re-Evaluation):  Psychosocial Re-Evaluation - 09/09/23 0830       Psychosocial Re-Evaluation   Current issues with Current Stress Concerns    Comments Moira Andrews has not voiced any increased concerns or stressors during exercise at cardiac rehab.    Expected Outcomes Moira Andrews will have decreaseed or contolled stress upon completion of cardiac rehab    Interventions Stress management education;Encouraged to attend Cardiac Rehabilitation for  the exercise;Relaxation education    Continue Psychosocial Services  No Follow up required      Initial Review   Source of Stress Concerns Unable to participate in former interests or hobbies;Unable to perform yard/household activities;Chronic Illness    Comments Will continue to monitor and offer support as needed             Vocational Rehabilitation: Provide vocational rehab assistance to qualifying candidates.   Vocational Rehab Evaluation & Intervention:  Vocational Rehab - 08/11/23 1433       Initial Vocational Rehab Evaluation & Intervention   Assessment shows need for Vocational Rehabilitation No   pt is retired            Education: Education Goals: Education classes will be provided on a weekly basis, covering required topics. Participant will state understanding/return demonstration of topics presented.    Education     Row Name 08/17/23 1300     Education   Cardiac Education Topics [employer]   Customer service manager   Weekly Topic Comforting Weekend Breakfasts   Instruction Review Code 1- Verbalizes Understanding   Class  Start Time 1145   Class Stop Time 1228   Class Time Calculation (min) 43 min    Row Name 08/22/23 1100     Education   Cardiac Education Topics Pritikin   Select Core Videos     Core Videos   Educator Exercise Physiologist   Select Exercise Education   Exercise Education Biomechanial Limitations   Instruction Review Code 1- Verbalizes Understanding   Class Start Time 1145   Class Stop Time 1218   Class Time Calculation (min) 33 min    Row Name 08/24/23 1100     Education   Cardiac Education Topics Pritikin   Customer service manager   Weekly Topic Fast Evening Meals   Instruction Review Code 1- Verbalizes Understanding   Class Start Time 1145   Class Stop Time 1225   Class Time Calculation (min) 40 min    Row Name 08/31/23 1500      Education   Cardiac Education Topics Pritikin   Customer service manager   Weekly Topic International [employer]isine- Spotlight on the [employer] Zones   Instruction Review Code 1- Verbalizes Understanding   Class Start Time 1145   Class Stop Time 1225   Class Time Calculation (min) 40 min    Row Name 09/05/23 1000     Education   Cardiac Education Topics Pritikin   Select Workshops     Workshops   Educator Exercise Physiologist   Select Psychosocial   Psychosocial Workshop Healthy Sleep for a Healthy Heart   Instruction Review Code 1- Verbalizes Understanding   Class Start Time 1148   Class Stop Time 1234   Class Time Calculation (min) 46 min    Row Name 09/12/23 1300     Education   Cardiac Education Topics Pritikin   Select Workshops     Workshops   Educator Exercise Physiologist   Select Exercise   Exercise Workshop Managing Heart Disease: Your Path to a Healthier Heart   Instruction Review Code 1- Verbalizes Understanding   Class Start Time 1145   Class Stop Time 1237   Class Time Calculation (min) 52 min            Core Videos: Exercise    Move It!  Clinical staff conducted group or individual video education with verbal and written material and guidebook.  Patient learns the recommended Pritikin exercise program. Exercise with the goal of living a long, healthy life. Some of the health benefits of exercise include controlled diabetes, healthier blood pressure levels, improved cholesterol levels, improved heart and lung capacity, improved sleep, and better body composition. Everyone should speak with their doctor before starting or changing an exercise routine.  Biomechanical Limitations Clinical staff conducted group or individual video education with verbal and written material and guidebook.  Patient learns how biomechanical limitations can impact exercise and how we can mitigate and possibly overcome limitations to have an  impactful and balanced exercise routine.  Body Composition Clinical staff conducted group or individual video education with verbal and written material and guidebook.  Patient learns that body composition (ratio of muscle mass to fat mass) is a key component to assessing overall fitness, rather than body weight alone. Increased fat mass, especially visceral belly fat, can put us  at increased risk for metabolic syndrome, type 2 diabetes, heart disease, and even death. It is recommended to combine diet and exercise (cardiovascular and resistance  training) to improve your body composition. Seek guidance from your physician and exercise physiologist before implementing an exercise routine.  Exercise Action Plan Clinical staff conducted group or individual video education with verbal and written material and guidebook.  Patient learns the recommended strategies to achieve and enjoy long-term exercise adherence, including variety, self-motivation, self-efficacy, and positive decision making. Benefits of exercise include fitness, good health, weight management, more energy, better sleep, less stress, and overall well-being.  Medical   Heart Disease Risk Reduction Clinical staff conducted group or individual video education with verbal and written material and guidebook.  Patient learns our heart is our most vital organ as it circulates oxygen, nutrients, white blood cells, and hormones throughout the entire body, and carries waste away. Data supports a plant-based eating plan like the Pritikin Program for its effectiveness in slowing progression of and reversing heart disease. The video provides a number of recommendations to address heart disease.   Metabolic Syndrome and Belly Fat  Clinical staff conducted group or individual video education with verbal and written material and guidebook.  Patient learns what metabolic syndrome is, how it leads to heart disease, and how one can reverse it and keep it  from coming back. You have metabolic syndrome if you have 3 of the following 5 criteria: abdominal obesity, high blood pressure, high triglycerides, low HDL cholesterol, and high blood sugar.  Hypertension and Heart Disease Clinical staff conducted group or individual video education with verbal and written material and guidebook.  Patient learns that high blood pressure, or hypertension, is very common in the [place] . Hypertension is largely due to excessive salt intake, but other important risk factors include being overweight, physical inactivity, drinking too much alcohol, smoking, and not eating enough potassium from fruits and vegetables. High blood pressure is a leading risk factor for heart attack, stroke, congestive heart failure, dementia, kidney failure, and premature death. Long-term effects of excessive salt intake include stiffening of the arteries and thickening of heart muscle and organ damage. Recommendations include ways to reduce hypertension and the risk of heart disease.  Diseases of Our Time - Focusing on Diabetes Clinical staff conducted group or individual video education with verbal and written material and guidebook.  Patient learns why the best way to stop diseases of our time is prevention, through food and other lifestyle changes. Medicine (such as prescription pills and surgeries) is often only a Band-Aid on the problem, not a long-term solution. Most common diseases of our time include obesity, type 2 diabetes, hypertension, heart disease, and cancer. The Pritikin Program is recommended and has been proven to help reduce, reverse, and/or prevent the damaging effects of metabolic syndrome.  Nutrition   Overview of the Pritikin Eating Plan  Clinical staff conducted group or individual video education with verbal and written material and guidebook.  Patient learns about the Pritikin Eating Plan for disease risk reduction. The Pritikin Eating Plan emphasizes a wide  variety of unrefined, minimally-processed carbohydrates, like fruits, vegetables, whole grains, and legumes. Go, Caution, and Stop food choices are explained. Plant-based and lean animal proteins are emphasized. Rationale provided for low sodium intake for blood pressure control, low added sugars for blood sugar stabilization, and low added fats and oils for coronary artery disease risk reduction and weight management.  Calorie Density  Clinical staff conducted group or individual video education with verbal and written material and guidebook.  Patient learns about calorie density and how it impacts the Pritikin Eating Plan. Knowing the characteristics of the  food you choose will help you decide whether those foods will lead to weight gain or weight loss, and whether you want to consume more or less of them. Weight loss is usually a side effect of the Pritikin Eating Plan because of its focus on low calorie-dense foods.  Label Reading  Clinical staff conducted group or individual video education with verbal and written material and guidebook.  Patient learns about the Pritikin recommended label reading guidelines and corresponding recommendations regarding calorie density, added sugars, sodium content, and whole grains.  Dining Out - Part 1  Clinical staff conducted group or individual video education with verbal and written material and guidebook.  Patient learns that restaurant meals can be sabotaging because they can be so high in calories, fat, sodium, and/or sugar. Patient learns recommended strategies on how to positively address this and avoid unhealthy pitfalls.  Facts on Fats  Clinical staff conducted group or individual video education with verbal and written material and guidebook.  Patient learns that lifestyle modifications can be just as effective, if not more so, as many medications for lowering your risk of heart disease. A Pritikin lifestyle can help to reduce your risk of  inflammation and atherosclerosis (cholesterol build-up, or plaque, in the artery walls). Lifestyle interventions such as dietary choices and physical activity address the cause of atherosclerosis. A review of the types of fats and their impact on blood cholesterol levels, along with dietary recommendations to reduce fat intake is also included.  Nutrition Action Plan  Clinical staff conducted group or individual video education with verbal and written material and guidebook.  Patient learns how to incorporate Pritikin recommendations into their lifestyle. Recommendations include planning and keeping personal health goals in mind as an important part of their success.  Healthy Mind-Set    Healthy Minds, Bodies, Hearts  Clinical staff conducted group or individual video education with verbal and written material and guidebook.  Patient learns how to identify when they are stressed. Video will discuss the impact of that stress, as well as the many benefits of stress management. Patient will also be introduced to stress management techniques. The way we think, act, and feel has an impact on our hearts.  How Our Thoughts Can Heal Our Hearts  Clinical staff conducted group or individual video education with verbal and written material and guidebook.  Patient learns that negative thoughts can cause depression and anxiety. This can result in negative lifestyle behavior and serious health problems. Cognitive behavioral therapy is an effective method to help control our thoughts in order to change and improve our emotional outlook.  Additional Videos:  Exercise    Improving Performance  Clinical staff conducted group or individual video education with verbal and written material and guidebook.  Patient learns to use a non-linear approach by alternating intensity levels and lengths of time spent exercising to help burn more calories and lose more body fat. Cardiovascular exercise helps improve heart health,  metabolism, hormonal balance, blood sugar control, and recovery from fatigue. Resistance training improves strength, endurance, balance, coordination, reaction time, metabolism, and muscle mass. Flexibility exercise improves circulation, posture, and balance. Seek guidance from your physician and exercise physiologist before implementing an exercise routine and learn your capabilities and proper form for all exercise.  Introduction to Yoga  Clinical staff conducted group or individual video education with verbal and written material and guidebook.  Patient learns about yoga, a discipline of the coming together of mind, breath, and body. The benefits of yoga include improved  flexibility, improved range of motion, better posture and core strength, increased lung function, weight loss, and positive self-image. Yoga's heart health benefits include lowered blood pressure, healthier heart rate, decreased cholesterol and triglyceride levels, improved immune function, and reduced stress. Seek guidance from your physician and exercise physiologist before implementing an exercise routine and learn your capabilities and proper form for all exercise.  Medical   Aging: Enhancing Your Quality of Life  Clinical staff conducted group or individual video education with verbal and written material and guidebook.  Patient learns key strategies and recommendations to stay in good physical health and enhance quality of life, such as prevention strategies, having an advocate, securing a Health Care Proxy and Power of Attorney, and keeping a list of medications and system for tracking them. It also discusses how to avoid risk for bone loss.  Biology of Weight Control  Clinical staff conducted group or individual video education with verbal and written material and guidebook.  Patient learns that weight gain occurs because we consume more calories than we burn (eating more, moving less). Even if your body weight is normal, you  may have higher ratios of fat compared to muscle mass. Too much body fat puts you at increased risk for cardiovascular disease, heart attack, stroke, type 2 diabetes, and obesity-related cancers. In addition to exercise, following the Pritikin Eating Plan can help reduce your risk.  Decoding Lab Results  Clinical staff conducted group or individual video education with verbal and written material and guidebook.  Patient learns that lab test reflects one measurement whose values change over time and are influenced by many factors, including medication, stress, sleep, exercise, food, hydration, pre-existing medical conditions, and more. It is recommended to use the knowledge from this video to become more involved with your lab results and evaluate your numbers to speak with your doctor.   Diseases of Our Time - Overview  Clinical staff conducted group or individual video education with verbal and written material and guidebook.  Patient learns that according to the CDC, 50% to 70% of chronic diseases (such as obesity, type 2 diabetes, elevated lipids, hypertension, and heart disease) are avoidable through lifestyle improvements including healthier food choices, listening to satiety cues, and increased physical activity.  Sleep Disorders Clinical staff conducted group or individual video education with verbal and written material and guidebook.  Patient learns how good quality and duration of sleep are important to overall health and well-being. Patient also learns about sleep disorders and how they impact health along with recommendations to address them, including discussing with a physician.  Nutrition  Dining Out - Part 2 Clinical staff conducted group or individual video education with verbal and written material and guidebook.  Patient learns how to plan ahead and communicate in order to maximize their dining experience in a healthy and nutritious manner. Included are recommended food choices  based on the type of restaurant the patient is visiting.   Fueling a Banker conducted group or individual video education with verbal and written material and guidebook.  There is a strong connection between our food choices and our health. Diseases like obesity and type 2 diabetes are very prevalent and are in large-part due to lifestyle choices. The Pritikin Eating Plan provides plenty of food and hunger-curbing satisfaction. It is easy to follow, affordable, and helps reduce health risks.  Menu Workshop  Clinical staff conducted group or individual video education with verbal and written material and guidebook.  Patient learns that  restaurant meals can sabotage health goals because they are often packed with calories, fat, sodium, and sugar. Recommendations include strategies to plan ahead and to communicate with the manager, chef, or server to help order a healthier meal.  Planning Your Eating Strategy  Clinical staff conducted group or individual video education with verbal and written material and guidebook.  Patient learns about the Pritikin Eating Plan and its benefit of reducing the risk of disease. The Pritikin Eating Plan does not focus on calories. Instead, it emphasizes high-quality, nutrient-rich foods. By knowing the characteristics of the foods, we choose, we can determine their calorie density and make informed decisions.  Targeting Your Nutrition Priorities  Clinical staff conducted group or individual video education with verbal and written material and guidebook.  Patient learns that lifestyle habits have a tremendous impact on disease risk and progression. This video provides eating and physical activity recommendations based on your personal health goals, such as reducing LDL cholesterol, losing weight, preventing or controlling type 2 diabetes, and reducing high blood pressure.  Vitamins and Minerals  Clinical staff conducted group or individual video  education with verbal and written material and guidebook.  Patient learns different ways to obtain key vitamins and minerals, including through a recommended healthy diet. It is important to discuss all supplements you take with your doctor.   Healthy Mind-Set    Smoking Cessation  Clinical staff conducted group or individual video education with verbal and written material and guidebook.  Patient learns that cigarette smoking and tobacco addiction pose a serious health risk which affects millions of people. Stopping smoking will significantly reduce the risk of heart disease, lung disease, and many forms of cancer. Recommended strategies for quitting are covered, including working with your doctor to develop a successful plan.  Culinary   Becoming a Set designer conducted group or individual video education with verbal and written material and guidebook.  Patient learns that cooking at home can be healthy, cost-effective, quick, and puts them in control. Keys to cooking healthy recipes will include looking at your recipe, assessing your equipment needs, planning ahead, making it simple, choosing cost-effective seasonal ingredients, and limiting the use of added fats, salts, and sugars.  Cooking - Breakfast and Snacks  Clinical staff conducted group or individual video education with verbal and written material and guidebook.  Patient learns how important breakfast is to satiety and nutrition through the entire day. Recommendations include key foods to eat during breakfast to help stabilize blood sugar levels and to prevent overeating at meals later in the day. Planning ahead is also a key component.  Cooking - Educational psychologist conducted group or individual video education with verbal and written material and guidebook.  Patient learns eating strategies to improve overall health, including an approach to cook more at home. Recommendations include thinking of  animal protein as a side on your plate rather than center stage and focusing instead on lower calorie dense options like vegetables, fruits, whole grains, and plant-based proteins, such as beans. Making sauces in large quantities to freeze for later and leaving the skin on your vegetables are also recommended to maximize your experience.  Cooking - Healthy Salads and Dressing Clinical staff conducted group or individual video education with verbal and written material and guidebook.  Patient learns that vegetables, fruits, whole grains, and legumes are the foundations of the Pritikin Eating Plan. Recommendations include how to incorporate each of these in flavorful and healthy salads, and  how to create homemade salad dressings. Proper handling of ingredients is also covered. Cooking - Soups and [employer] - Soups and Desserts Clinical staff conducted group or individual video education with verbal and written material and guidebook.  Patient learns that Pritikin soups and desserts make for easy, nutritious, and delicious snacks and meal components that are low in sodium, fat, sugar, and calorie density, while high in vitamins, minerals, and filling fiber. Recommendations include simple and healthy ideas for soups and desserts.   Overview     The Pritikin Solution Program Overview Clinical staff conducted group or individual video education with verbal and written material and guidebook.  Patient learns that the results of the Pritikin Program have been documented in more than 100 articles published in peer-reviewed journals, and the benefits include reducing risk factors for (and, in some cases, even reversing) high cholesterol, high blood pressure, type 2 diabetes, obesity, and more! An overview of the three key pillars of the Pritikin Program will be covered: eating well, doing regular exercise, and having a healthy mind-set.  WORKSHOPS  Exercise: Exercise Basics: Building Your Action  Plan Clinical staff led group instruction and group discussion with PowerPoint presentation and patient guidebook. To enhance the learning environment the use of posters, models and videos may be added. At the conclusion of this workshop, patients will comprehend the difference between physical activity and exercise, as well as the benefits of incorporating both, into their routine. Patients will understand the FITT (Frequency, Intensity, Time, and Type) principle and how to use it to build an exercise action plan. In addition, safety concerns and other considerations for exercise and cardiac rehab will be addressed by the presenter. The purpose of this lesson is to promote a comprehensive and effective weekly exercise routine in order to improve patients' overall level of fitness.   Managing Heart Disease: Your Path to a Healthier Heart Clinical staff led group instruction and group discussion with PowerPoint presentation and patient guidebook. To enhance the learning environment the use of posters, models and videos may be added.At the conclusion of this workshop, patients will understand the anatomy and physiology of the heart. Additionally, they will understand how Pritikin's three pillars impact the risk factors, the progression, and the management of heart disease.  The purpose of this lesson is to provide a high-level overview of the heart, heart disease, and how the Pritikin lifestyle positively impacts risk factors.  Exercise Biomechanics Clinical staff led group instruction and group discussion with PowerPoint presentation and patient guidebook. To enhance the learning environment the use of posters, models and videos may be added. Patients will learn how the structural parts of their bodies function and how these functions impact their daily activities, movement, and exercise. Patients will learn how to promote a neutral spine, learn how to manage pain, and identify ways to improve  their physical movement in order to promote healthy living. The purpose of this lesson is to expose patients to common physical limitations that impact physical activity. Participants will learn practical ways to adapt and manage aches and pains, and to minimize their effect on regular exercise. Patients will learn how to maintain good posture while sitting, walking, and lifting.  Balance Training and Fall Prevention  Clinical staff led group instruction and group discussion with PowerPoint presentation and patient guidebook. To enhance the learning environment the use of posters, models and videos may be added. At the conclusion of this workshop, patients will understand the importance of their sensorimotor skills (vision,  proprioception, and the vestibular system) in maintaining their ability to balance as they age. Patients will apply a variety of balancing exercises that are appropriate for their current level of function. Patients will understand the common causes for poor balance, possible solutions to these problems, and ways to modify their physical environment in order to minimize their fall risk. The purpose of this lesson is to teach patients about the importance of maintaining balance as they age and ways to minimize their risk of falling.  WORKSHOPS   Nutrition:  Fueling a Ship broker led group instruction and group discussion with PowerPoint presentation and patient guidebook. To enhance the learning environment the use of posters, models and videos may be added. Patients will review the foundational principles of the Pritikin Eating Plan and understand what constitutes a serving size in each of the food groups. Patients will also learn Pritikin-friendly foods that are better choices when away from home and review make-ahead meal and snack options. Calorie density will be reviewed and applied to three nutrition priorities: weight maintenance, weight loss, and weight  gain. The purpose of this lesson is to reinforce (in a group setting) the key concepts around what patients are recommended to eat and how to apply these guidelines when away from home by planning and selecting Pritikin-friendly options. Patients will understand how calorie density may be adjusted for different weight management goals.  Mindful Eating  Clinical staff led group instruction and group discussion with PowerPoint presentation and patient guidebook. To enhance the learning environment the use of posters, models and videos may be added. Patients will briefly review the concepts of the [facility] Eating Plan and the importance of low-calorie dense foods. The concept of mindful eating will be introduced as well as the importance of paying attention to internal hunger signals. Triggers for non-hunger eating and techniques for dealing with triggers will be explored. The purpose of this lesson is to provide patients with the opportunity to review the basic principles of the [facility]in Eating Plan, discuss the value of eating mindfully and how to measure internal cues of hunger and fullness using the Hunger Scale. Patients will also discuss reasons for non-hunger eating and learn strategies to use for controlling emotional eating.  Targeting Your Nutrition Priorities Clinical staff led group instruction and group discussion with PowerPoint presentation and patient guidebook. To enhance the learning environment the use of posters, models and videos may be added. Patients will learn how to determine their genetic susceptibility to disease by reviewing their family history. Patients will gain insight into the importance of diet as part of an overall healthy lifestyle in mitigating the impact of genetics and other environmental insults. The purpose of this lesson is to provide patients with the opportunity to assess their personal nutrition priorities by looking at their family history, their own health history  and current risk factors. Patients will also be able to discuss ways of prioritizing and modifying the [facility]in Eating Plan for their highest risk areas  Menu  Clinical staff led group instruction and group discussion with PowerPoint presentation and patient guidebook. To enhance the learning environment the use of posters, models and videos may be added. Using menus brought in from [employer], or printed from [employer], patients will apply the [facility] dining out guidelines that were presented in the [employer] video. Patients will also be able to practice these guidelines in a variety of provided scenarios. The purpose of this lesson is to provide patients with  the opportunity to practice hands-on learning of the [employer] guidelines with actual menus and practice scenarios.  Label Reading Clinical staff led group instruction and group discussion with PowerPoint presentation and patient guidebook. To enhance the learning environment the use of posters, models and videos may be added. Patients will review and discuss the [facility] label reading guidelines presented in [employer]'s Label Reading Educational series video. Using fool labels brought in from local grocery stores and markets, patients will apply the label reading guidelines and determine if the packaged food meet the [facility]ritikin guidelines. The purpose of this lesson is to provide patients with the opportunity to review, discuss, and practice hands-on learning of the [facility] Label Reading guidelines with actual packaged food labels. Cooking School  [facility]'s [employer] are designed to teach patients ways to prepare quick, simple, and affordable recipes at home. The importance of nutrition's role in chronic disease risk reduction is reflected in its emphasis in the overall [facility]in program. By learning how to prepare essential core P[facility]in Eating Plan recipes, patients will increase control over  what they eat; be able to customize the flavor of foods without the use of added salt, sugar, or fat; and improve the quality of the food they consume. By learning a set of core recipes which are easily assembled, quickly prepared, and affordable, patients are more likely to prepare more healthy foods at home. These workshops focus on convenient breakfasts, simple entres, side dishes, and desserts which can be prepared with minimal effort and are consistent with nutrition recommendations for cardiovascular risk reduction. Cooking [employer] are taught by a Armed forces logistics/support/administrative officer (RD) who has been trained by the [employer]. The chef or RD has a clear understanding of the importance of minimizing - if not completely eliminating - added fat, sugar, and sodium in recipes. Throughout the series of Cooking School Workshop sessions, patients will learn about healthy ingredients and efficient methods of cooking to build confidence in their capability to prepare    Cooking School weekly topics:  Adding Flavor- Sodium-Free  Fast and Healthy Breakfasts  Powerhouse Plant-Based Proteins  Satisfying Salads and Dressings  Simple Sides and Sauces  International Cuisine-Spotlight on the [employer] Zones  Delicious Desserts  Savory Soups  [employer] - Meals in a Astronomer Appetizers and Snacks  Comforting Weekend Breakfasts  One-Pot Wonders   Fast Evening Meals  Landscape architect Your Pritikin Plate  WORKSHOPS   Healthy Mindset (Psychosocial):  Focused Goals, Sustainable Changes Clinical staff led group instruction and group discussion with PowerPoint presentation and patient guidebook. To enhance the learning environment the use of posters, models and videos may be added. Patients will be able to apply effective goal setting strategies to establish at least one personal goal, and then take consistent, meaningful action toward that goal. They will learn to  identify common barriers to achieving personal goals and develop strategies to overcome them. Patients will also gain an understanding of how our mind-set can impact our ability to achieve goals and the importance of cultivating a positive and growth-oriented mind-set. The purpose of this lesson is to provide patients with a deeper understanding of how to set and achieve personal goals, as well as the tools and strategies needed to overcome common obstacles which may arise along the way.  From Head to Heart: The Power of a Healthy Outlook  Clinical staff led group instruction and group discussion with PowerPoint presentation and patient guidebook. To enhance the  learning environment the use of posters, models and videos may be added. Patients will be able to recognize and describe the impact of emotions and mood on physical health. They will discover the importance of self-care and explore self-care practices which may work for them. Patients will also learn how to utilize the 4 C's to cultivate a healthier outlook and better manage stress and challenges. The purpose of this lesson is to demonstrate to patients how a healthy outlook is an essential part of maintaining good health, especially as they continue their cardiac rehab journey.  Healthy Sleep for a Healthy Heart Clinical staff led group instruction and group discussion with PowerPoint presentation and patient guidebook. To enhance the learning environment the use of posters, models and videos may be added. At the conclusion of this workshop, patients will be able to demonstrate knowledge of the importance of sleep to overall health, well-being, and quality of life. They will understand the symptoms of, and treatments for, common sleep disorders. Patients will also be able to identify daytime and nighttime behaviors which impact sleep, and they will be able to apply these tools to help manage sleep-related challenges. The purpose of this lesson is to  provide patients with a general overview of sleep and outline the importance of quality sleep. Patients will learn about a few of the most common sleep disorders. Patients will also be introduced to the concept of "sleep hygiene," and discover ways to self-manage certain sleeping problems through simple daily behavior changes. Finally, the workshop will motivate patients by clarifying the links between quality sleep and their goals of heart-healthy living.   Recognizing and Reducing Stress Clinical staff led group instruction and group discussion with PowerPoint presentation and patient guidebook. To enhance the learning environment the use of posters, models and videos may be added. At the conclusion of this workshop, patients will be able to understand the types of stress reactions, differentiate between acute and chronic stress, and recognize the impact that chronic stress has on their health. They will also be able to apply different coping mechanisms, such as reframing negative self-talk. Patients will have the opportunity to practice a variety of stress management techniques, such as deep abdominal breathing, progressive muscle relaxation, and/or guided imagery.  The purpose of this lesson is to educate patients on the role of stress in their lives and to provide healthy techniques for coping with it.  Learning Barriers/Preferences:  Learning Barriers/Preferences - 08/11/23 1433       Learning Barriers/Preferences   Learning Barriers Sight   reading glasses   Learning Preferences Audio;Computer/Internet;Group Instruction;Individual Instruction;Pictoral;Skilled Demonstration;Verbal Instruction;Video;Written Material             Education Topics:  Knowledge Questionnaire Score:  Knowledge Questionnaire Score - 08/11/23 1433       Knowledge Questionnaire Score   Pre Score 21/24             Core Components/Risk Factors/Patient Goals at Admission:  Personal Goals and Risk Factors  at Admission - 08/11/23 1433       Core Components/Risk Factors/Patient Goals on Admission    Weight Management Yes;Obesity;Weight Loss    Intervention Weight Management: Develop a combined nutrition and exercise program designed to reach desired caloric intake, while maintaining appropriate intake of nutrient and fiber, sodium and fats, and appropriate energy expenditure required for the weight goal.;Weight Management: Provide education and appropriate resources to help participant work on and attain dietary goals.;Weight Management/Obesity: Establish reasonable short term and long term weight goals.;Obesity: Provide  education and appropriate resources to help participant work on and attain dietary goals.    Expected Outcomes Short Term: Continue to assess and modify interventions until short term weight is achieved;Long Term: Adherence to nutrition and physical activity/exercise program aimed toward attainment of established weight goal;Weight Loss: Understanding of general recommendations for a balanced deficit meal plan, which promotes 1-2 lb weight loss per week and includes a negative energy balance of [phone] kcal/d;Understanding recommendations for meals to include 15-35% energy as protein, 25-35% energy from fat, 35-60% energy from carbohydrates, less than 200mg  of dietary cholesterol, 20-35 gm of total fiber daily;Understanding of distribution of calorie intake throughout the day with the consumption of 4-5 meals/snacks    Diabetes Yes    Intervention Provide education about signs/symptoms and action to take for hypo/hyperglycemia.;Provide education about proper nutrition, including hydration, and aerobic/resistive exercise prescription along with prescribed medications to achieve blood glucose in normal ranges: Fasting glucose 65-99 mg/dL    Expected Outcomes Short Term: Participant verbalizes understanding of the signs/symptoms and immediate care of hyper/hypoglycemia, proper foot care and  importance of medication, aerobic/resistive exercise and nutrition plan for blood glucose control.;Long Term: Attainment of HbA1C < 7%.    Hypertension Yes    Intervention Provide education on lifestyle modifcations including regular physical activity/exercise, weight management, moderate sodium restriction and increased consumption of fresh fruit, vegetables, and low fat dairy, alcohol moderation, and smoking cessation.;Monitor prescription use compliance.    Expected Outcomes Short Term: Continued assessment and intervention until BP is < 140/68mm HG in hypertensive participants. < 130/28mm HG in hypertensive participants with diabetes, heart failure or chronic kidney disease.;Long Term: Maintenance of blood pressure at goal levels.    Lipids Yes    Intervention Provide education and support for participant on nutrition & aerobic/resistive exercise along with prescribed medications to achieve LDL 70mg , HDL >40mg .    Expected Outcomes Short Term: Participant states understanding of desired cholesterol values and is compliant with medications prescribed. Participant is following exercise prescription and nutrition guidelines.;Long Term: Cholesterol controlled with medications as prescribed, with individualized exercise RX and with personalized nutrition plan. Value goals: LDL < 70mg , HDL > 40 mg.    Stress Yes    Intervention Offer individual and/or small group education and counseling on adjustment to heart disease, stress management and health-related lifestyle change. Teach and support self-help strategies.;Refer participants experiencing significant psychosocial distress to appropriate mental health specialists for further evaluation and treatment. When possible, include family members and significant others in education/counseling sessions.    Expected Outcomes Short Term: Participant demonstrates changes in health-related behavior, relaxation and other stress management skills, ability to obtain  effective social support, and compliance with psychotropic medications if prescribed.;Long Term: Emotional wellbeing is indicated by absence of clinically significant psychosocial distress or social isolation.             Core Components/Risk Factors/Patient Goals Review:   Goals and Risk Factor Review     Row Name 08/18/23 1610 09/09/23 0921           Core Components/Risk Factors/Patient Goals Review   Personal Goals Review Weight Management/Obesity;Hypertension;Lipids;Diabetes Weight Management/Obesity;Hypertension;Lipids;Diabetes      Review Moira Andrews started cardiac rehab on 08/17/23. Moira Andrews did well with exercise for her fitness level. Moira Andrews is somewhat deconditioned and uses a rollator for stability. Moderate exertional systolic Bp's noted. Will continue to monitor BP Moira Andrews started cardiac rehab on 08/17/23. Moira Andrews did well with exercise for her fitness level. Moira Andrews is somewhat deconditioned and uses a rollator for  stability. Systolic BP's noted to be in the 150's at cardiac rehab resting. Vital signs have been forwarded to Dr Stann Earnest for review.      Expected Outcomes Moira Andrews will continue to participate in cardiac rehab for exercise, nutrition and lifestyle modifications Moira Andrews will continue to participate in cardiac rehab for exercise, nutrition and lifestyle modifications               Core Components/Risk Factors/Patient Goals at Discharge (Final Review):   Goals and Risk Factor Review - 09/09/23 0921       Core Components/Risk Factors/Patient Goals Review   Personal Goals Review Weight Management/Obesity;Hypertension;Lipids;Diabetes    Review Moira Andrews started cardiac rehab on 08/17/23. Moira Andrews did well with exercise for her fitness level. Moira Andrews is somewhat deconditioned and uses a rollator for stability. Systolic BP's noted to be in the 150's at cardiac rehab resting. Vital signs have been forwarded to Dr Stann Earnest for review.    Expected Outcomes Moira Andrews  will continue to participate in cardiac rehab for exercise, nutrition and lifestyle modifications             ITP Comments:  ITP Comments     Row Name 08/11/23 1021 08/18/23 0812 08/23/23 0759 09/09/23 0824     ITP Comments Tamala Fair, MD:  Medical Director.  Introduction to the [facility] Education Program/ Intensive Cardiac Rehab. Initial oreintation packet reviewed with the patient. 30 Day ITP Review. Moira Andrews started cardiac rehab on 08/17/23. Moira Andrews did well with exercise for her fitness level 30 Day ITP Review. Moira Andrews started cardiac rehab on 08/17/23. Moira Andrews is off to a good start  with exercise for her fitness level 30 Day ITP Review. Moira Andrews has good participation with exercise at cardiac rehab when in attendance             Comments: See ITP comments

## 2023-09-14 ENCOUNTER — Other Ambulatory Visit (HOSPITAL_COMMUNITY): Payer: Self-pay

## 2023-09-14 ENCOUNTER — Encounter (HOSPITAL_COMMUNITY)
Admission: RE | Admit: 2023-09-14 | Discharge: 2023-09-14 | Disposition: A | Discharge status: Home / Self Care | Source: Ambulatory Visit | Attending: Cardiovascular Disease | Admitting: Cardiovascular Disease

## 2023-09-14 DIAGNOSIS — Z952 Presence of prosthetic heart valve: Secondary | ICD-10-CM

## 2023-09-19 ENCOUNTER — Encounter (HOSPITAL_COMMUNITY)
Admission: RE | Admit: 2023-09-19 | Discharge: 2023-09-19 | Disposition: A | Discharge status: Home / Self Care | Source: Ambulatory Visit | Attending: Cardiovascular Disease | Admitting: Cardiovascular Disease

## 2023-09-19 DIAGNOSIS — Z952 Presence of prosthetic heart valve: Secondary | ICD-10-CM

## 2023-09-21 ENCOUNTER — Encounter (HOSPITAL_COMMUNITY)
Admission: RE | Admit: 2023-09-21 | Discharge: 2023-09-21 | Disposition: A | Discharge status: Home / Self Care | Source: Ambulatory Visit | Attending: Cardiovascular Disease

## 2023-09-21 DIAGNOSIS — Z952 Presence of prosthetic heart valve: Secondary | ICD-10-CM | POA: Diagnosis not present

## 2023-09-26 ENCOUNTER — Encounter (HOSPITAL_COMMUNITY)
Admission: RE | Admit: 2023-09-26 | Discharge: 2023-09-26 | Disposition: A | Discharge status: Home / Self Care | Source: Ambulatory Visit | Attending: Cardiovascular Disease | Admitting: Cardiovascular Disease

## 2023-09-26 DIAGNOSIS — Z952 Presence of prosthetic heart valve: Secondary | ICD-10-CM | POA: Insufficient documentation

## 2023-09-26 NOTE — Progress Notes (Signed)
 Reviewed home exercise guidelines with [facility] including endpoints, temperature precautions, target heart rate and rate of perceived exertion. She is currently walking 20 minutes 2 days/week as her mode of home exercise. Walking is limited by bilateral knee pain and nerve pain in her right leg. Discussed taking rest breaks with walking and looking into seated / chair aerobics that she can do. [name] voices understanding of instructions given.  Doree Games, MS, ACSM CEP

## 2023-09-28 ENCOUNTER — Encounter (HOSPITAL_COMMUNITY)
Admission: RE | Admit: 2023-09-28 | Discharge: 2023-09-28 | Disposition: A | Discharge status: Home / Self Care | Source: Ambulatory Visit | Attending: Cardiovascular Disease | Admitting: Cardiovascular Disease

## 2023-09-28 DIAGNOSIS — Z952 Presence of prosthetic heart valve: Secondary | ICD-10-CM | POA: Diagnosis not present

## 2023-10-03 ENCOUNTER — Encounter (HOSPITAL_COMMUNITY)
Admission: RE | Admit: 2023-10-03 | Discharge: 2023-10-03 | Disposition: A | Discharge status: Home / Self Care | Source: Ambulatory Visit | Attending: Cardiovascular Disease | Admitting: Cardiovascular Disease

## 2023-10-03 ENCOUNTER — Telehealth: Payer: Self-pay | Admitting: Cardiovascular Disease

## 2023-10-03 DIAGNOSIS — Z952 Presence of prosthetic heart valve: Secondary | ICD-10-CM

## 2023-10-03 MED ORDER — AMOXICILLIN 500 MG PO TABS: ORAL_TABLET | ORAL | 1 refills | Status: DC

## 2023-10-03 NOTE — Telephone Encounter (Signed)
 Pt c/o medication issue:  1. Name of Medication:   amoxicillin  (AMOXIL ) 500 MG tablet    2. How are you currently taking this medication (dosage and times per day)? Take 4 tablets (2000 mg) by mouth one hour prior to dental procedure.   3. Are you having a reaction (difficulty breathing--STAT)? No  4. What is your medication issue? Patient stated she is going to have dental work and is needing antibiotics. Please advise.

## 2023-10-03 NOTE — Telephone Encounter (Signed)
 Pt is calling in for SBE prophylaxis for upcoming dental work.  Pt has a history of bentall procedure.   Pt is on long-term bactrim , so will clarify with our PharmD team if it is ok to call in SBE Prophylaxis (Amoxicillin ) for pt to take prior to dental work.   Per V. Patel [facility], Bactrim  is not part of the preferred dental PPX abx regimens, so SBE should be called in.  Per V. Patel [facility], she is on low dose bactrim , would advise that we call in Amoxil  2 gm 30-60 min before dental work.    Discussed the use of prophylactic antibiotics before dental work and other surgeries. Prescription given for Amoxicillin  2 grams orally one hour before procedure, sent to the pts confirmed pharmacy of choice.   Pt verbalized understanding and agrees with this plan.  Pt was more than gracious for all the assistance provided.

## 2023-10-05 ENCOUNTER — Encounter (HOSPITAL_COMMUNITY)
Admission: RE | Admit: 2023-10-05 | Discharge: 2023-10-05 | Disposition: A | Discharge status: Home / Self Care | Source: Ambulatory Visit | Attending: Cardiovascular Disease | Admitting: Cardiovascular Disease

## 2023-10-05 DIAGNOSIS — Z952 Presence of prosthetic heart valve: Secondary | ICD-10-CM | POA: Diagnosis not present

## 2023-10-10 ENCOUNTER — Encounter (HOSPITAL_COMMUNITY)
Admission: RE | Admit: 2023-10-10 | Discharge: 2023-10-10 | Disposition: A | Discharge status: Home / Self Care | Source: Ambulatory Visit | Attending: Cardiovascular Disease | Admitting: Cardiovascular Disease

## 2023-10-10 DIAGNOSIS — Z952 Presence of prosthetic heart valve: Secondary | ICD-10-CM | POA: Diagnosis not present

## 2023-10-12 ENCOUNTER — Encounter (HOSPITAL_COMMUNITY)
Admission: RE | Admit: 2023-10-12 | Discharge: 2023-10-12 | Disposition: A | Discharge status: Home / Self Care | Source: Ambulatory Visit | Attending: Cardiovascular Disease | Admitting: Cardiovascular Disease

## 2023-10-12 ENCOUNTER — Other Ambulatory Visit (HOSPITAL_COMMUNITY): Payer: Self-pay

## 2023-10-12 DIAGNOSIS — Z952 Presence of prosthetic heart valve: Secondary | ICD-10-CM | POA: Diagnosis not present

## 2023-10-12 NOTE — Progress Notes (Signed)
 Cardiac Individual Treatment Plan  Patient Details  Name: Aleea  ELIKA GODAR MRN: [id number] Date of Birth: 05/12/1956 Referring Provider:   Flowsheet Row INTENSIVE CARDIAC REHAB ORIENT from 08/11/2023 in [facility]  Referring Provider Janelle Mediate, MD       Initial Encounter Date:  Flowsheet Row INTENSIVE CARDIAC REHAB ORIENT from 08/11/2023 in [facility]  Date 08/11/23       Visit Diagnosis: 06/14/23 S/P AVR (aortic valve replacement)  Patient's Home Medications on Admission:  Current Outpatient Medications:    acetaminophen  (TYLENOL ) 500 MG tablet, Take 1,000 mg by mouth every 6 (six) hours as needed for mild pain (pain score 1-3) or headache., Disp: , Rfl:    albuterol  (PROAIR  HFA) 108 (90 BASE) MCG/ACT inhaler, Inhale 2 puffs into the lungs every 6 (six) hours as needed., Disp: 1 Inhaler, Rfl: 12   amiodarone  (PACERONE ) 200 MG tablet, Take 1 tablet (200 mg total) by mouth daily., Disp: 90 tablet, Rfl: 3   amLODipine  (NORVASC ) 10 MG tablet, Take 1 tablet (10 mg total) by mouth daily., Disp: 90 tablet, Rfl: 1   amoxicillin  (AMOXIL ) 500 MG tablet, Take 4 tablets (2000 mg) by mouth one hour prior to dental procedure., Disp: 4 tablet, Rfl: 1   apixaban  (ELIQUIS ) 5 MG TABS tablet, Take 1 tablet (5 mg total) by mouth 2 (two) times daily., Disp: 60 tablet, Rfl: 3   ascorbic acid (VITAMIN C) 500 MG tablet, Take 500 mg by mouth daily., Disp: , Rfl:    aspirin  EC 81 MG tablet, Take 1 tablet (81 mg total) by mouth daily. Swallow whole., Disp: , Rfl:    cetirizine  (ZYRTEC ) 10 MG tablet, Take 1 tablet (10 mg total) by mouth daily., Disp: 30 tablet, Rfl: 0   docusate sodium  (COLACE) 100 MG capsule, Take 200-300 mg by mouth at bedtime., Disp: , Rfl:    ezetimibe  (ZETIA ) 10 MG tablet, Take 1 tablet (10 mg total) by mouth daily., Disp: 30 tablet, Rfl: 1   fluticasone  (FLONASE ) 50  MCG/ACT nasal spray, Place 2 sprays into both nostrils daily. (Patient taking differently: Place 2 sprays into both nostrils daily as needed for rhinitis.), Disp: 16 g, Rfl: 0   furosemide  (LASIX ) 40 MG tablet, Take 1 tablet (40 mg total) by mouth daily as needed for edema or fluid (shortness of breath)., Disp: 90 tablet, Rfl: 3   gabapentin  (NEURONTIN ) 600 MG tablet, Take 600 mg by mouth 3 (three) times daily., Disp: , Rfl:    iron  polysaccharides (NIFEREX) 150 MG capsule, TAKE 1 CAPSULE (150 MG DOSE) BY MOUTH TWICE A DAY (Patient taking differently: Take 150 mg by mouth daily.), Disp: 180 capsule, Rfl: 1   losartan  (COZAAR ) 100 MG tablet, Take 1 tablet (100 mg total) by mouth daily., Disp: 30 tablet, Rfl: 1   [Paused] metFORMIN  (GLUCOPHAGE ) 1000 MG tablet, Take 1 tablet (1,000 mg total) by mouth 2 (two) times daily with a meal. (Patient not taking: Reported on 08/09/2023), Disp: 180 tablet, Rfl: 1   metoprolol  tartrate (LOPRESSOR ) 25 MG tablet, Take 1 tablet (25 mg total) by mouth 2 (two) times daily., Disp: 180 tablet, Rfl: 3   ondansetron  (ZOFRAN ) 8 MG tablet, Take 8 mg by mouth 3 (three) times daily., Disp: , Rfl:    pantoprazole  (PROTONIX ) 40 MG tablet, Take 40 mg by mouth daily., Disp: , Rfl:    polyvinyl alcohol (LIQUIFILM TEARS) 1.4 %  ophthalmic solution, Place 1 drop into both eyes as needed for dry eyes., Disp: , Rfl:    rosuvastatin  (CRESTOR ) 10 MG tablet, Take 1 tablet (10 mg total) by mouth every other day., Disp: 45 tablet, Rfl: 3   Semaglutide , 2 MG/DOSE, (OZEMPIC , 2 MG/DOSE,) 8 MG/3ML SOPN, Inject 2 mg into the skin once a week., Disp: , Rfl:    sulfamethoxazole -trimethoprim  (BACTRIM ) 400-80 MG tablet, TAKE 1 TABLET BY MOUTH TWICE DAILY, Disp: 60 tablet, Rfl: 1  Past Medical History: Past Medical History:  Diagnosis Date   Allergy    Anemia    Anxiety    Arthritis    Asthma    BMI 40.0-44.9, adult (HCC) 04/07/2014   DM type 2 (diabetes mellitus, type 2) (HCC)    GERD  (gastroesophageal reflux disease)    HTN (hypertension)    Hypercholesterolemia    Osteoarthritis of left hip 04/07/2014   Reflux    Severe aortic stenosis    Spinal headache    with C-Section and with spinal fusion in 2019    Tobacco Use: Social History   Tobacco Use  Smoking Status Never  Smokeless Tobacco Never    Labs: Review Flowsheet  More data exists      Latest Ref Rng & Units 02/03/2023 04/27/2023 04/28/2023 05/24/2023 06/14/2023  Labs for ITP Cardiac and Pulmonary Rehab  Cholestrol 100 - 199 mg/dL 161  - - - -  LDL (calc) 0 - 99 mg/dL 86  - - - -  Direct LDL 0 - 99 mg/dL - 096  - - -  HDL-C >04 mg/dL 43  - - - -  Trlycerides 0 - 149 mg/dL 540  - - - -  Hemoglobin A1c 4.8 - 5.6 % - - 6.2  5.9  -  PH, Arterial 7.35 - 7.45 - - - - 7.313  7.297  7.319  7.263  7.295  7.434  7.450  7.404   PCO2 arterial 32 - 48 mmHg - - - - 52.4  48.8  50.6  54.5  50.1  37.5  38.2  45.6   Bicarbonate 20.0 - 28.0 mmol/L - - - - 26.5  24.0  26.2  24.9  24.4  25.1  26.6  28.2  28.6   TCO2 22 - 32 mmol/L - - - - 28  25  28  27  23  26  25  26  26  28  27  30  30  27  26    Acid-base deficit 0.0 - 2.0 mmol/L - - - - 3.0  1.0  3.0  2.0   O2 Saturation % - - - - 98  98  97  87  100  100  100  63  100     Details       Multiple values from one day are sorted in reverse-chronological order         Capillary Blood Glucose: Lab Results  Component Value Date   GLUCAP 108 (H) 08/31/2023   GLUCAP 82 08/24/2023   GLUCAP 126 (H) 08/22/2023   GLUCAP 83 08/17/2023   GLUCAP 89 08/17/2023     Exercise Target Goals: Exercise Program Goal: Individual exercise prescription set using results from initial 6 min walk test and THRR while considering  patient's activity barriers and safety.   Exercise Prescription Goal: Initial exercise prescription builds to 30-45 minutes a day of aerobic activity, 2-3 days per week.  Home exercise guidelines will be given to patient during program  as part of  exercise prescription that the participant will acknowledge.  Activity Barriers & Risk Stratification:  Activity Barriers & Cardiac Risk Stratification - 08/11/23 1421       Activity Barriers & Cardiac Risk Stratification   Activity Barriers Balance Concerns;Arthritis;Joint Problems;History of Falls;Back Problems;Deconditioning;Assistive Device;Muscular Weakness;Shortness of Breath;Neck/Spine Problems    Cardiac Risk Stratification High             6 Minute Walk:  6 Minute Walk     Row Name 08/11/23 1419         6 Minute Walk   Phase Initial  Pt used her rollator     Distance 25.53 feet     Walk Time 6 minutes     # of Rest Breaks 1  Pt stopped at 5:40, 20 seconds early     MPH 1.82     METS 2.15     RPE 13     Perceived Dyspnea  1     VO2 Peak 7.53     Symptoms Yes (comment)     Comments 5/10 right knee pain     Resting HR 83 bpm     Resting BP 132/70     Resting Oxygen Saturation  98 %     Exercise Oxygen Saturation  during 6 min walk 99 %     Max Ex. HR 105 bpm     Max Ex. BP 180/90     2 Minute Post BP 134/80              Oxygen Initial Assessment:   Oxygen Re-Evaluation:   Oxygen Discharge (Final Oxygen Re-Evaluation):   Initial Exercise Prescription:  Initial Exercise Prescription - 08/11/23 1400       Date of Initial Exercise RX and Referring Provider   Date 08/11/23    Referring Provider Janelle Mediate, MD    Expected Discharge Date 11/02/23      NuStep   Level 3    SPM 75    Minutes 25    METs 2.1      Prescription Details   Frequency (times per week) 3    Duration Progress to 30 minutes of continuous aerobic without signs/symptoms of physical distress      Intensity   THRR 40-80% of Max Heartrate 62-123    Ratings of Perceived Exertion 11-13    Perceived Dyspnea 0-4      Progression   Progression Continue progressive overload as per policy without signs/symptoms or physical distress.      Resistance Training   Training  Prescription Yes    Weight 2 lbs    Reps 10-15             Perform Capillary Blood Glucose checks as needed.  Exercise Prescription Changes:   Exercise Prescription Changes     Row Name 08/17/23 1024 08/22/23 1015 09/05/23 1031 09/19/23 1031 10/03/23 1031     Response to Exercise   Blood Pressure (Admit) 118/80 118/80 156/82 124/70 166/80   Blood Pressure (Exercise) 166/80 132/80 158/80 -- --   Blood Pressure (Exit) 134/78 150/81 118/76 118/76 130/80   Heart Rate (Admit) 85 bpm 87 bpm 82 bpm 91 bpm 80 bpm   Heart Rate (Exercise) 94 bpm 98 bpm 107 bpm 103 bpm 108 bpm   Heart Rate (Exit) 81 bpm 90 bpm 91 bpm 91 bpm 89 bpm   Rating of Perceived Exertion (Exercise) 9 11 11 10 12    Symptoms None None None None None  Comments Off to a good start with exercise. -- Will increase workload on NuStep next session from 3 to 4. Reviewed goals with Moira Andrews. Started the arm ergometer today.   Duration Progress to 30 minutes of  aerobic without signs/symptoms of physical distress Continue with 30 min of aerobic exercise without signs/symptoms of physical distress. Continue with 30 min of aerobic exercise without signs/symptoms of physical distress. Continue with 30 min of aerobic exercise without signs/symptoms of physical distress. Continue with 30 min of aerobic exercise without signs/symptoms of physical distress.   Intensity THRR unchanged THRR unchanged THRR unchanged THRR unchanged THRR unchanged     Progression   Progression Continue to progress workloads to maintain intensity without signs/symptoms of physical distress. Continue to progress workloads to maintain intensity without signs/symptoms of physical distress. Continue to progress workloads to maintain intensity without signs/symptoms of physical distress. Continue to progress workloads to maintain intensity without signs/symptoms of physical distress. Continue to progress workloads to maintain intensity without signs/symptoms of  physical distress.   Average METs 1.6 1.5 1.5 1.5 1.5     Resistance Training   Training Prescription No Yes Yes Yes Yes   Weight Relaxation day, no weights. 2 lbs 2 lbs 2 lbs 2 lbs   Reps -- 10-15 10-15 10-15 10-15   Time -- 10 Minutes 10 Minutes 10 Minutes 10 Minutes     Interval Training   Interval Training No No No No No     NuStep   Level 1 1 3 4 5    SPM 124 124 131 117 109   Minutes 25 30 30 30 15    METs 1.6 1.5 1.5 1.5 1.5     Arm Ergometer   Level -- -- -- -- 1   Watts -- -- -- -- 8   Minutes -- -- -- -- 15   METs -- -- -- -- 1.4     Home Exercise Plan   Plans to continue exercise at -- -- -- Home (comment)  Walking Home (comment)  Walking   Frequency -- -- -- Add 2 additional days to program exercise sessions. Add 2 additional days to program exercise sessions.   Initial Home Exercises Provided -- -- -- -- 09/26/23            Exercise Comments:   Exercise Comments     Row Name 08/17/23 1130 09/19/23 1117 09/26/23 1100 09/28/23 1107 10/03/23 1110   Exercise Comments Paitent tolerated first exercise session well without symptoms. Oriented her to the stretching routine, modified to seated when needed. Reviewed goals with Moira Andrews. Reviewed home exercise guidelines and goals with Moira Andrews. Moira Andrews continues to progress well with exercise. Discussed trying an arm ergometer, and she is interested in starting the AE next week. Moira Andrews started the arm ergometer today and tolerated well without symptoms.            Exercise Goals and Review:   Exercise Goals     Row Name 08/11/23 1422             Exercise Goals   Increase Physical Activity Yes       Intervention Provide advice, education, support and counseling about physical activity/exercise needs.;Develop an individualized exercise prescription for aerobic and resistive training based on initial evaluation findings, risk stratification, comorbidities and participant's personal goals.       Expected  Outcomes Short Term: Attend rehab on a regular basis to increase amount of physical activity.;Long Term: Exercising regularly at least 3-5 days a week.;Long Term:  Add in home exercise to make exercise part of routine and to increase amount of physical activity.       Increase Strength and Stamina Yes       Intervention Provide advice, education, support and counseling about physical activity/exercise needs.;Develop an individualized exercise prescription for aerobic and resistive training based on initial evaluation findings, risk stratification, comorbidities and participant's personal goals.       Expected Outcomes Short Term: Increase workloads from initial exercise prescription for resistance, speed, and METs.;Short Term: Perform resistance training exercises routinely during rehab and add in resistance training at home;Long Term: Improve cardiorespiratory fitness, muscular endurance and strength as measured by increased METs and functional capacity ( )       Able to understand and use rate of perceived exertion (RPE) scale Yes       Intervention Provide education and explanation on how to use RPE scale       Expected Outcomes Short Term: Able to use RPE daily in rehab to express subjective intensity level;Long Term:  Able to use RPE to guide intensity level when exercising independently       Knowledge and understanding of Target Heart Rate Range (THRR) Yes       Intervention Provide education and explanation of THRR including how the numbers were predicted and where they are located for reference       Expected Outcomes Short Term: Able to state/look up THRR;Long Term: Able to use THRR to govern intensity when exercising independently;Short Term: Able to use daily as guideline for intensity in rehab       Understanding of Exercise Prescription Yes       Intervention Provide education, explanation, and written materials on patient's individual exercise prescription       Expected Outcomes Short  Term: Able to explain program exercise prescription;Long Term: Able to explain home exercise prescription to exercise independently                Exercise Goals Re-Evaluation :  Exercise Goals Re-Evaluation     Row Name 08/17/23 1130 09/19/23 1117 09/26/23 1100         Exercise Goal Re-Evaluation   Exercise Goals Review Increase Physical Activity;Increase Strength and Stamina;Able to understand and use rate of perceived exertion (RPE) scale Increase Physical Activity;Increase Strength and Stamina;Able to understand and use rate of perceived exertion (RPE) scale Increase Physical Activity;Increase Strength and Stamina;Able to understand and use rate of perceived exertion (RPE) scale;Knowledge and understanding of Target Heart Rate Range (THRR);Understanding of Exercise Prescription     Comments Macaria  was able to understand and use RPE scale appropriately. Moira Andrews is walking 20 minutes 2 days/week in addition to exercise at cardiac rehab. Reviewed exercise prescription with Moira Andrews. She is walking 20 minutes 2 days/week but is limited by knee and leg pain. She will look into seated exercise that she can do via video like Youtube. Her goal is to lose weight.     Expected Outcomes Progress workloads as tolerated to help increase cardiorespiratory fitness. Continue walking at home in addition to exercise at cardiac rehab to help increase strength and stamina. Continue walking and increase workloads as tolerated.              Discharge Exercise Prescription (Final Exercise Prescription Changes):  Exercise Prescription Changes - 10/03/23 1031       Response to Exercise   Blood Pressure (Admit) 166/80    Blood Pressure (Exit) 130/80    Heart Rate (Admit) 80 bpm  Heart Rate (Exercise) 108 bpm    Heart Rate (Exit) 89 bpm    Rating of Perceived Exertion (Exercise) 12    Symptoms None    Comments Started the arm ergometer today.    Duration Continue with 30 min of aerobic exercise  without signs/symptoms of physical distress.    Intensity THRR unchanged      Progression   Progression Continue to progress workloads to maintain intensity without signs/symptoms of physical distress.    Average METs 1.5      Resistance Training   Training Prescription Yes    Weight 2 lbs    Reps 10-15    Time 10 Minutes      Interval Training   Interval Training No      NuStep   Level 5    SPM 109    Minutes 15    METs 1.5      Arm Ergometer   Level 1    Watts 8    Minutes 15    METs 1.4      Home Exercise Plan   Plans to continue exercise at Home (comment)   Walking   Frequency Add 2 additional days to program exercise sessions.    Initial Home Exercises Provided 09/26/23             Nutrition:  Target Goals: Understanding of nutrition guidelines, daily intake of sodium 1500mg , cholesterol 200mg , calories 30% from fat and 7% or less from saturated fats, daily to have 5 or more servings of fruits and vegetables.  Biometrics:  Pre Biometrics - 08/11/23 1105       Pre Biometrics   Waist Circumference 47 inches    Hip Circumference 51.25 inches    Waist to Hip Ratio 0.92 %    Triceps Skinfold 33 mm    % Body Fat 53.3 %    Grip Strength 17 kg    Flexibility --   Not performed, hx of back surgery x 4   Single Leg Stand 5.57 seconds              Nutrition Therapy Plan and Nutrition Goals:  Nutrition Therapy & Goals - 09/19/23 1209       Nutrition Therapy   Diet Heart Healthy Diet    Drug/Food Interactions Statins/Certain Fruits      Personal Nutrition Goals   Nutrition Goal Patient to identify strategies for reducing cardiovascular risk by attending the [facility] education and nutrition series weekly.   goal in progress.   Personal Goal #2 Patient to improve diet quality by using the plate method as a guide for meal planning to include lean protein/plant protein, fruits, vegetables, whole grains, nonfat dairy as part of a well-balanced diet.    goal in progress.   Personal Goal #3 Patient to identify strategies for weight loss of 0.5-2.0# per week.   goal not met.   Comments Teriann  has medical history of s/p AVR, chronic diastolic heart failure, PAF, HTN, DM2. LDL is not at goal (zetia , crestor ). Her A1c has improved on increased dose of Ozempic  (2mg ). She is motivated to lose weight; however, she is up 1.1# since starting with our program. She continues to attend the [facility] education and nutrition series regularly. Patient will benefit from participation in intensive cardiac rehab for nutrition, exercise, and lifestyle modification.      Intervention Plan   Intervention Prescribe, educate and counsel regarding individualized specific dietary modifications aiming towards targeted core components such as weight, hypertension, lipid  management, diabetes, heart failure and other comorbidities.;Nutrition handout(s) given to patient.    Expected Outcomes Short Term Goal: Understand basic principles of dietary content, such as calories, fat, sodium, cholesterol and nutrients.;Long Term Goal: Adherence to prescribed nutrition plan.             Nutrition Assessments:  MEDIFICTS Score Key: >=70 Need to make dietary changes  40-70 Heart Healthy Diet <= 40 Therapeutic Level Cholesterol Diet    Picture Your Plate Scores: <65 Unhealthy dietary pattern with much room for improvement. 41-50 Dietary pattern unlikely to meet recommendations for good health and room for improvement. 51-60 More healthful dietary pattern, with some room for improvement.  >60 Healthy dietary pattern, although there may be some specific behaviors that could be improved.    Nutrition Goals Re-Evaluation:  Nutrition Goals Re-Evaluation     Row Name 08/19/23 1057 09/19/23 1209           Goals   Current Weight 200 lb 2.8 oz (90.8 kg) 200 lb 9.9 oz (91 kg)      Comment A1c 5.9, lipids WNL, direct LDL 106 no new labs; most recent labs A1c 5.9, lipids WNL,  direct LDL 106      Expected Outcome Pema  has medical history of s/p AVR, chronic diastolic heart failure, PAF, HTN, DM2. LDL is not at goal (zetia , crestor ). Her A1c has improved on increased dose of Ozempic  (2mg ). She is motivated to lose weight. Patient will benefit from participation in intensive cardiac rehab for nutrition, exercise, and lifestyle modification. Carisha  has medical history of s/p AVR, chronic diastolic heart failure, PAF, HTN, DM2. LDL is not at goal (zetia , crestor ). Her A1c has improved on increased dose of Ozempic  (2mg ). She is motivated to lose weight; however, she is up 1.1# since starting with our program. She continues to attend the [facility] education and nutrition series regularly. Patient will benefit from participation in intensive cardiac rehab for nutrition, exercise, and lifestyle modification.               Nutrition Goals Re-Evaluation:  Nutrition Goals Re-Evaluation     Row Name 08/19/23 1057 09/19/23 1209           Goals   Current Weight 200 lb 2.8 oz (90.8 kg) 200 lb 9.9 oz (91 kg)      Comment A1c 5.9, lipids WNL, direct LDL 106 no new labs; most recent labs A1c 5.9, lipids WNL, direct LDL 106      Expected Outcome Kaiyah  has medical history of s/p AVR, chronic diastolic heart failure, PAF, HTN, DM2. LDL is not at goal (zetia , crestor ). Her A1c has improved on increased dose of Ozempic  (2mg ). She is motivated to lose weight. Patient will benefit from participation in intensive cardiac rehab for nutrition, exercise, and lifestyle modification. Iracema  has medical history of s/p AVR, chronic diastolic heart failure, PAF, HTN, DM2. LDL is not at goal (zetia , crestor ). Her A1c has improved on increased dose of Ozempic  (2mg ). She is motivated to lose weight; however, she is up 1.1# since starting with our program. She continues to attend the [facility] education and nutrition series regularly. Patient will benefit from participation in intensive cardiac  rehab for nutrition, exercise, and lifestyle modification.               Nutrition Goals Discharge (Final Nutrition Goals Re-Evaluation):  Nutrition Goals Re-Evaluation - 09/19/23 1209       Goals   Current Weight 200 lb 9.9 oz (91 kg)  Comment no new labs; most recent labs A1c 5.9, lipids WNL, direct LDL 106    Expected Outcome Nusaiba  has medical history of s/p AVR, chronic diastolic heart failure, PAF, HTN, DM2. LDL is not at goal (zetia , crestor ). Her A1c has improved on increased dose of Ozempic  (2mg ). She is motivated to lose weight; however, she is up 1.1# since starting with our program. She continues to attend the [facility] education and nutrition series regularly. Patient will benefit from participation in intensive cardiac rehab for nutrition, exercise, and lifestyle modification.             Psychosocial: Target Goals: Acknowledge presence or absence of significant depression and/or stress, maximize coping skills, provide positive support system. Participant is able to verbalize types and ability to use techniques and skills needed for reducing stress and depression.  Initial Review & Psychosocial Screening:  Initial Psych Review & Screening - 08/11/23 1432       Initial Review   Current issues with Current Stress Concerns    Comments Pt voices a low level stress in regards to her recent surgery      Family Dynamics   Good Support System? Yes   Pt has husband for support     Barriers   Psychosocial barriers to participate in program The patient should benefit from training in stress management and relaxation.      Screening Interventions   Interventions Encouraged to exercise    Expected Outcomes Short Term goal: Utilizing psychosocial counselor, staff and physician to assist with identification of specific Stressors or current issues interfering with healing process. Setting desired goal for each stressor or current issue identified.;Long Term Goal: Stressors  or current issues are controlled or eliminated.;Short Term goal: Identification and review with participant of any Quality of Life or Depression concerns found by scoring the questionnaire.;Long Term goal: The participant improves quality of Life and PHQ9 Scores as seen by post scores and/or verbalization of changes             Quality of Life Scores:  Quality of Life - 08/11/23 1432       Quality of Life   Select Quality of Life      Quality of Life Scores   Health/Function Pre 25.53 %    Socioeconomic Pre 24.75 %    Psych/Spiritual Pre 25.29 %    Family Pre 27.3 %    GLOBAL Pre 25.61 %            Scores of 19 and below usually indicate a poorer quality of life in these areas.  A difference of  2-3 points is a clinically meaningful difference.  A difference of 2-3 points in the total score of the Quality of Life Index has been associated with significant improvement in overall quality of life, self-image, physical symptoms, and general health in studies assessing change in quality of life.  PHQ-9: Review Flowsheet  More data exists      08/11/2023 08/09/2023 04/26/2023 10/26/2022 09/30/2017  Depression screen PHQ 2/9  Decreased Interest 0 0 0 0 0  Down, Depressed, Hopeless 0 0 0 0 0  PHQ - 2 Score 0 0 0 0 0  Altered sleeping 0 - - - -  Tired, decreased energy 1 - - - -  Change in appetite 0 - - - -  Feeling bad or failure about yourself  0 - - - -  Trouble concentrating 0 - - - -  Moving slowly or fidgety/restless 0 - - - -  Suicidal thoughts 0 - - - -  PHQ-9 Score 1 - - - -  Difficult doing work/chores Not difficult at all - - - -   Interpretation of Total Score  Total Score Depression Severity:  1-4 = Minimal depression, 5-9 = Mild depression, 10-14 = Moderate depression, 15-19 = Moderately severe depression, 20-27 = Severe depression   Psychosocial Evaluation and Intervention:   Psychosocial Re-Evaluation:  Psychosocial Re-Evaluation     Row Name 08/18/23 0813  09/09/23 0830 10/11/23 1007         Psychosocial Re-Evaluation   Current issues with Current Stress Concerns Current Stress Concerns Current Stress Concerns     Comments Moira Andrews started cardiac rehab on 08/17/23. Moira Andrews did not not voice any increased concerns or stressors. Moira Andrews admits to having low energy post open heart surgery. Moira Andrews hopes that that her energy level will improve participaitng in cardiac rehab. Moira Andrews has not voiced any increased concerns or stressors during exercise at cardiac rehab. Moira Andrews continues  not to  voice any increased concerns or stressors during exercise at cardiac rehab.     Expected Outcomes Moira Andrews will have decreaseed or contolled stress upon completion of cardiac rehab Moira Andrews will have decreaseed or contolled stress upon completion of cardiac rehab Moira Andrews will have decreaseed or contolled stress upon completion of cardiac rehab     Interventions Stress management education;Encouraged to attend Cardiac Rehabilitation for the exercise;Relaxation education Stress management education;Encouraged to attend Cardiac Rehabilitation for the exercise;Relaxation education Stress management education;Encouraged to attend Cardiac Rehabilitation for the exercise;Relaxation education     Continue Psychosocial Services  Follow up required by staff No Follow up required No Follow up required       Initial Review   Source of Stress Concerns Unable to participate in former interests or hobbies;Unable to perform yard/household activities;Chronic Illness Unable to participate in former interests or hobbies;Unable to perform yard/household activities;Chronic Illness Unable to participate in former interests or hobbies;Unable to perform yard/household activities;Chronic Illness     Comments Will continue to monitor and offer support as needed Will continue to monitor and offer support as needed Will continue to monitor and offer support as needed               Psychosocial Discharge (Final Psychosocial Re-Evaluation):  Psychosocial Re-Evaluation - 10/11/23 1007       Psychosocial Re-Evaluation   Current issues with Current Stress Concerns    Comments Moira Andrews continues  not to  voice any increased concerns or stressors during exercise at cardiac rehab.    Expected Outcomes Moira Andrews will have decreaseed or contolled stress upon completion of cardiac rehab    Interventions Stress management education;Encouraged to attend Cardiac Rehabilitation for the exercise;Relaxation education    Continue Psychosocial Services  No Follow up required      Initial Review   Source of Stress Concerns Unable to participate in former interests or hobbies;Unable to perform yard/household activities;Chronic Illness    Comments Will continue to monitor and offer support as needed             Vocational Rehabilitation: Provide vocational rehab assistance to qualifying candidates.   Vocational Rehab Evaluation & Intervention:  Vocational Rehab - 08/11/23 1433       Initial Vocational Rehab Evaluation & Intervention   Assessment shows need for Vocational Rehabilitation No   pt is retired            Education: Education Goals: Education classes will be provided on a weekly basis, covering required  topics. Participant will state understanding/return demonstration of topics presented.    Education     Row Name 08/17/23 1300     Education   Cardiac Education Topics Pritikin   Orthoptist   Educator Dietitian   Weekly Topic Comforting Weekend Breakfasts   Instruction Review Code 1- Verbalizes Understanding   Class Start Time 1145   Class Stop Time 1228   Class Time Calculation (min) 43 min    Row Name 08/22/23 1100     Education   Cardiac Education Topics Pritikin   Select Core Videos     Core Videos   Educator Exercise Physiologist   Select Exercise Education   Exercise Education Biomechanial Limitations    Instruction Review Code 1- Verbalizes Understanding   Class Start Time 1145   Class Stop Time 1218   Class Time Calculation (min) 33 min    Row Name 08/24/23 1100     Education   Cardiac Education Topics Pritikin   Customer service manager   Weekly Topic Fast Evening Meals   Instruction Review Code 1- Verbalizes Understanding   Class Start Time 1145   Class Stop Time 1225   Class Time Calculation (min) 40 min    Row Name 08/31/23 1500     Education   Cardiac Education Topics Pritikin   Customer service manager   Weekly Topic International Cuisine- Spotlight on the [employer] Zones   Instruction Review Code 1- Verbalizes Understanding   Class Start Time 1145   Class Stop Time 1225   Class Time Calculation (min) 40 min    Row Name 09/05/23 1000     Education   Cardiac Education Topics Pritikin   Select Workshops     Workshops   Educator Exercise Physiologist   Select Psychosocial   Psychosocial Workshop Healthy Sleep for a Healthy Heart   Instruction Review Code 1- Verbalizes Understanding   Class Start Time 1148   Class Stop Time 1234   Class Time Calculation (min) 46 min    Row Name 09/12/23 1300     Education   Cardiac Education Topics Pritikin   Select Workshops     Workshops   Educator Exercise Physiologist   Select Exercise   Exercise Workshop Managing Heart Disease: Your Path to a Healthier Heart   Instruction Review Code 1- Verbalizes Understanding   Class Start Time 1145   Class Stop Time 1237   Class Time Calculation (min) 52 min    Row Name 09/14/23 1300     Education   Cardiac Education Topics Pritikin   Customer service manager   Weekly Topic Powerhouse Plant-Based Proteins   Instruction Review Code 1- Verbalizes Understanding   Class Start Time 1145   Class Stop Time 1222   Class Time Calculation (min) 37 min    Row Name  09/19/23 1100     Education   Cardiac Education Topics Pritikin   Geographical information systems officer Psychosocial   Psychosocial Workshop From Head to Heart: The Power of a Healthy Outlook   Instruction Review Code 1- Verbalizes Understanding   Class Start Time 1147   Class Stop Time 1234   Class Time Calculation (min) 47 min    Row Name 09/26/23  1100     Education   Cardiac Education Topics Pritikin   Psychologist, forensic Psychosocial   Psychosocial Healthy Minds, Bodies, Hearts   Instruction Review Code 1- Verbalizes Understanding   Class Start Time 1147   Class Stop Time 1221   Class Time Calculation (min) 34 min    Row Name 09/28/23 1400     Education   Cardiac Education Topics Pritikin   Customer service manager   Weekly Topic Adding Flavor - Sodium-Free   Instruction Review Code 1- Verbalizes Understanding   Class Start Time 1145   Class Stop Time 1221   Class Time Calculation (min) 36 min    Row Name 10/03/23 1300     Education   Cardiac Education Topics Pritikin   Glass blower/designer Nutrition   Nutrition Workshop Label Reading   Instruction Review Code 1- Verbalizes Understanding   Class Start Time 1145   Class Stop Time 1228   Class Time Calculation (min) 43 min    Row Name 10/10/23 1100     Education   Cardiac Education Topics Pritikin   Hospital doctor Education   Instruction Review Code 1- Verbalizes Understanding   Class Start Time 1150   Class Stop Time 1230   Class Time Calculation (min) 40 min            Core Videos: Exercise    Move It!  Clinical staff conducted group or individual video education with verbal and written material and guidebook.  Patient learns the recommended  Pritikin exercise program. Exercise with the goal of living a long, healthy life. Some of the health benefits of exercise include controlled diabetes, healthier blood pressure levels, improved cholesterol levels, improved heart and lung capacity, improved sleep, and better body composition. Everyone should speak with their doctor before starting or changing an exercise routine.  Biomechanical Limitations Clinical staff conducted group or individual video education with verbal and written material and guidebook.  Patient learns how biomechanical limitations can impact exercise and how we can mitigate and possibly overcome limitations to have an impactful and balanced exercise routine.  Body Composition Clinical staff conducted group or individual video education with verbal and written material and guidebook.  Patient learns that body composition (ratio of muscle mass to fat mass) is a key component to assessing overall fitness, rather than body weight alone. Increased fat mass, especially visceral belly fat, can put us  at increased risk for metabolic syndrome, type 2 diabetes, heart disease, and even death. It is recommended to combine diet and exercise (cardiovascular and resistance training) to improve your body composition. Seek guidance from your physician and exercise physiologist before implementing an exercise routine.  Exercise Action Plan Clinical staff conducted group or individual video education with verbal and written material and guidebook.  Patient learns the recommended strategies to achieve and enjoy long-term exercise adherence, including variety, self-motivation, self-efficacy, and positive decision making. Benefits of exercise include fitness, good health, weight management, more energy, better sleep, less stress, and overall well-being.  Medical   Heart Disease Risk Reduction Clinical staff conducted group or individual video education with verbal and written material and  guidebook.  Patient learns our heart is our most vital  organ as it circulates oxygen, nutrients, white blood cells, and hormones throughout the entire body, and carries waste away. Data supports a plant-based eating plan like the Pritikin Program for its effectiveness in slowing progression of and reversing heart disease. The video provides a number of recommendations to address heart disease.   Metabolic Syndrome and Belly Fat  Clinical staff conducted group or individual video education with verbal and written material and guidebook.  Patient learns what metabolic syndrome is, how it leads to heart disease, and how one can reverse it and keep it from coming back. You have metabolic syndrome if you have 3 of the following 5 criteria: abdominal obesity, high blood pressure, high triglycerides, low HDL cholesterol, and high blood sugar.  Hypertension and Heart Disease Clinical staff conducted group or individual video education with verbal and written material and guidebook.  Patient learns that high blood pressure, or hypertension, is very common in the [place] . Hypertension is largely due to excessive salt intake, but other important risk factors include being overweight, physical inactivity, drinking too much alcohol, smoking, and not eating enough potassium from fruits and vegetables. High blood pressure is a leading risk factor for heart attack, stroke, congestive heart failure, dementia, kidney failure, and premature death. Long-term effects of excessive salt intake include stiffening of the arteries and thickening of heart muscle and organ damage. Recommendations include ways to reduce hypertension and the risk of heart disease.  Diseases of Our Time - Focusing on Diabetes Clinical staff conducted group or individual video education with verbal and written material and guidebook.  Patient learns why the best way to stop diseases of our time is prevention, through food and other lifestyle  changes. Medicine (such as prescription pills and surgeries) is often only a Band-Aid on the problem, not a long-term solution. Most common diseases of our time include obesity, type 2 diabetes, hypertension, heart disease, and cancer. The Pritikin Program is recommended and has been proven to help reduce, reverse, and/or prevent the damaging effects of metabolic syndrome.  Nutrition   Overview of the Pritikin Eating Plan  Clinical staff conducted group or individual video education with verbal and written material and guidebook.  Patient learns about the Pritikin Eating Plan for disease risk reduction. The Pritikin Eating Plan emphasizes a wide variety of unrefined, minimally-processed carbohydrates, like fruits, vegetables, whole grains, and legumes. Go, Caution, and Stop food choices are explained. Plant-based and lean animal proteins are emphasized. Rationale provided for low sodium intake for blood pressure control, low added sugars for blood sugar stabilization, and low added fats and oils for coronary artery disease risk reduction and weight management.  Calorie Density  Clinical staff conducted group or individual video education with verbal and written material and guidebook.  Patient learns about calorie density and how it impacts the Pritikin Eating Plan. Knowing the characteristics of the food you choose will help you decide whether those foods will lead to weight gain or weight loss, and whether you want to consume more or less of them. Weight loss is usually a side effect of the Pritikin Eating Plan because of its focus on low calorie-dense foods.  Label Reading  Clinical staff conducted group or individual video education with verbal and written material and guidebook.  Patient learns about the Pritikin recommended label reading guidelines and corresponding recommendations regarding calorie density, added sugars, sodium content, and whole grains.  Dining Out - Part 1  Clinical staff  conducted group or individual video education with verbal and  written material and guidebook.  Patient learns that restaurant meals can be sabotaging because they can be so high in calories, fat, sodium, and/or sugar. Patient learns recommended strategies on how to positively address this and avoid unhealthy pitfalls.  Facts on Fats  Clinical staff conducted group or individual video education with verbal and written material and guidebook.  Patient learns that lifestyle modifications can be just as effective, if not more so, as many medications for lowering your risk of heart disease. A Pritikin lifestyle can help to reduce your risk of inflammation and atherosclerosis (cholesterol build-up, or plaque, in the artery walls). Lifestyle interventions such as dietary choices and physical activity address the cause of atherosclerosis. A review of the types of fats and their impact on blood cholesterol levels, along with dietary recommendations to reduce fat intake is also included.  Nutrition Action Plan  Clinical staff conducted group or individual video education with verbal and written material and guidebook.  Patient learns how to incorporate Pritikin recommendations into their lifestyle. Recommendations include planning and keeping personal health goals in mind as an important part of their success.  Healthy Mind-Set    Healthy Minds, Bodies, Hearts  Clinical staff conducted group or individual video education with verbal and written material and guidebook.  Patient learns how to identify when they are stressed. Video will discuss the impact of that stress, as well as the many benefits of stress management. Patient will also be introduced to stress management techniques. The way we think, act, and feel has an impact on our hearts.  How Our Thoughts Can Heal Our Hearts  Clinical staff conducted group or individual video education with verbal and written material and guidebook.  Patient learns that  negative thoughts can cause depression and anxiety. This can result in negative lifestyle behavior and serious health problems. Cognitive behavioral therapy is an effective method to help control our thoughts in order to change and improve our emotional outlook.  Additional Videos:  Exercise    Improving Performance  Clinical staff conducted group or individual video education with verbal and written material and guidebook.  Patient learns to use a non-linear approach by alternating intensity levels and lengths of time spent exercising to help burn more calories and lose more body fat. Cardiovascular exercise helps improve heart health, metabolism, hormonal balance, blood sugar control, and recovery from fatigue. Resistance training improves strength, endurance, balance, coordination, reaction time, metabolism, and muscle mass. Flexibility exercise improves circulation, posture, and balance. Seek guidance from your physician and exercise physiologist before implementing an exercise routine and learn your capabilities and proper form for all exercise.  Introduction to Yoga  Clinical staff conducted group or individual video education with verbal and written material and guidebook.  Patient learns about yoga, a discipline of the coming together of mind, breath, and body. The benefits of yoga include improved flexibility, improved range of motion, better posture and core strength, increased lung function, weight loss, and positive self-image. Yoga's heart health benefits include lowered blood pressure, healthier heart rate, decreased cholesterol and triglyceride levels, improved immune function, and reduced stress. Seek guidance from your physician and exercise physiologist before implementing an exercise routine and learn your capabilities and proper form for all exercise.  Medical   Aging: Enhancing Your Quality of Life  Clinical staff conducted group or individual video education with verbal and  written material and guidebook.  Patient learns key strategies and recommendations to stay in good physical health and enhance quality of life, such as  prevention strategies, having an advocate, securing a Health Care Proxy and Power of Attorney, and keeping a list of medications and system for tracking them. It also discusses how to avoid risk for bone loss.  Biology of Weight Control  Clinical staff conducted group or individual video education with verbal and written material and guidebook.  Patient learns that weight gain occurs because we consume more calories than we burn (eating more, moving less). Even if your body weight is normal, you may have higher ratios of fat compared to muscle mass. Too much body fat puts you at increased risk for cardiovascular disease, heart attack, stroke, type 2 diabetes, and obesity-related cancers. In addition to exercise, following the Pritikin Eating Plan can help reduce your risk.  Decoding Lab Results  Clinical staff conducted group or individual video education with verbal and written material and guidebook.  Patient learns that lab test reflects one measurement whose values change over time and are influenced by many factors, including medication, stress, sleep, exercise, food, hydration, pre-existing medical conditions, and more. It is recommended to use the knowledge from this video to become more involved with your lab results and evaluate your numbers to speak with your doctor.   Diseases of Our Time - Overview  Clinical staff conducted group or individual video education with verbal and written material and guidebook.  Patient learns that according to the CDC, 50% to 70% of chronic diseases (such as obesity, type 2 diabetes, elevated lipids, hypertension, and heart disease) are avoidable through lifestyle improvements including healthier food choices, listening to satiety cues, and increased physical activity.  Sleep Disorders Clinical staff  conducted group or individual video education with verbal and written material and guidebook.  Patient learns how good quality and duration of sleep are important to overall health and well-being. Patient also learns about sleep disorders and how they impact health along with recommendations to address them, including discussing with a physician.  Nutrition  Dining Out - Part 2 Clinical staff conducted group or individual video education with verbal and written material and guidebook.  Patient learns how to plan ahead and communicate in order to maximize their dining experience in a healthy and nutritious manner. Included are recommended food choices based on the type of restaurant the patient is visiting.   Fueling a Banker conducted group or individual video education with verbal and written material and guidebook.  There is a strong connection between our food choices and our health. Diseases like obesity and type 2 diabetes are very prevalent and are in large-part due to lifestyle choices. The Pritikin Eating Plan provides plenty of food and hunger-curbing satisfaction. It is easy to follow, affordable, and helps reduce health risks.  Menu Workshop  Clinical staff conducted group or individual video education with verbal and written material and guidebook.  Patient learns that restaurant meals can sabotage health goals because they are often packed with calories, fat, sodium, and sugar. Recommendations include strategies to plan ahead and to communicate with the manager, chef, or server to help order a healthier meal.  Planning Your Eating Strategy  Clinical staff conducted group or individual video education with verbal and written material and guidebook.  Patient learns about the Pritikin Eating Plan and its benefit of reducing the risk of disease. The Pritikin Eating Plan does not focus on calories. Instead, it emphasizes high-quality, nutrient-rich foods. By knowing  the characteristics of the foods, we choose, we can determine their calorie density and make informed  decisions.  Targeting Your Nutrition Priorities  Clinical staff conducted group or individual video education with verbal and written material and guidebook.  Patient learns that lifestyle habits have a tremendous impact on disease risk and progression. This video provides eating and physical activity recommendations based on your personal health goals, such as reducing LDL cholesterol, losing weight, preventing or controlling type 2 diabetes, and reducing high blood pressure.  Vitamins and Minerals  Clinical staff conducted group or individual video education with verbal and written material and guidebook.  Patient learns different ways to obtain key vitamins and minerals, including through a recommended healthy diet. It is important to discuss all supplements you take with your doctor.   Healthy Mind-Set    Smoking Cessation  Clinical staff conducted group or individual video education with verbal and written material and guidebook.  Patient learns that cigarette smoking and tobacco addiction pose a serious health risk which affects millions of people. Stopping smoking will significantly reduce the risk of heart disease, lung disease, and many forms of cancer. Recommended strategies for quitting are covered, including working with your doctor to develop a successful plan.  Culinary   Becoming a Set designer conducted group or individual video education with verbal and written material and guidebook.  Patient learns that cooking at home can be healthy, cost-effective, quick, and puts them in control. Keys to cooking healthy recipes will include looking at your recipe, assessing your equipment needs, planning ahead, making it simple, choosing cost-effective seasonal ingredients, and limiting the use of added fats, salts, and sugars.  Cooking - Breakfast and Snacks  Clinical  staff conducted group or individual video education with verbal and written material and guidebook.  Patient learns how important breakfast is to satiety and nutrition through the entire day. Recommendations include key foods to eat during breakfast to help stabilize blood sugar levels and to prevent overeating at meals later in the day. Planning ahead is also a key component.  Cooking - Educational psychologist conducted group or individual video education with verbal and written material and guidebook.  Patient learns eating strategies to improve overall health, including an approach to cook more at home. Recommendations include thinking of animal protein as a side on your plate rather than center stage and focusing instead on lower calorie dense options like vegetables, fruits, whole grains, and plant-based proteins, such as beans. Making sauces in large quantities to freeze for later and leaving the skin on your vegetables are also recommended to maximize your experience.  Cooking - Healthy Salads and Dressing Clinical staff conducted group or individual video education with verbal and written material and guidebook.  Patient learns that vegetables, fruits, whole grains, and legumes are the foundations of the Pritikin Eating Plan. Recommendations include how to incorporate each of these in flavorful and healthy salads, and how to create homemade salad dressings. Proper handling of ingredients is also covered. Cooking - Soups and [employer] - Soups and Desserts Clinical staff conducted group or individual video education with verbal and written material and guidebook.  Patient learns that Pritikin soups and desserts make for easy, nutritious, and delicious snacks and meal components that are low in sodium, fat, sugar, and calorie density, while high in vitamins, minerals, and filling fiber. Recommendations include simple and healthy ideas for soups and desserts.   Overview     The  Pritikin Solution Program Overview Clinical staff conducted group or individual video education with verbal and written  material and guidebook.  Patient learns that the results of the Pritikin Program have been documented in more than 100 articles published in peer-reviewed journals, and the benefits include reducing risk factors for (and, in some cases, even reversing) high cholesterol, high blood pressure, type 2 diabetes, obesity, and more! An overview of the three key pillars of the Pritikin Program will be covered: eating well, doing regular exercise, and having a healthy mind-set.  WORKSHOPS  Exercise: Exercise Basics: Building Your Action Plan Clinical staff led group instruction and group discussion with PowerPoint presentation and patient guidebook. To enhance the learning environment the use of posters, models and videos may be added. At the conclusion of this workshop, patients will comprehend the difference between physical activity and exercise, as well as the benefits of incorporating both, into their routine. Patients will understand the FITT (Frequency, Intensity, Time, and Type) principle and how to use it to build an exercise action plan. In addition, safety concerns and other considerations for exercise and cardiac rehab will be addressed by the presenter. The purpose of this lesson is to promote a comprehensive and effective weekly exercise routine in order to improve patients' overall level of fitness.   Managing Heart Disease: Your Path to a Healthier Heart Clinical staff led group instruction and group discussion with PowerPoint presentation and patient guidebook. To enhance the learning environment the use of posters, models and videos may be added.At the conclusion of this workshop, patients will understand the anatomy and physiology of the heart. Additionally, they will understand how Pritikin's three pillars impact the risk factors, the progression, and the management  of heart disease.  The purpose of this lesson is to provide a high-level overview of the heart, heart disease, and how the Pritikin lifestyle positively impacts risk factors.  Exercise Biomechanics Clinical staff led group instruction and group discussion with PowerPoint presentation and patient guidebook. To enhance the learning environment the use of posters, models and videos may be added. Patients will learn how the structural parts of their bodies function and how these functions impact their daily activities, movement, and exercise. Patients will learn how to promote a neutral spine, learn how to manage pain, and identify ways to improve their physical movement in order to promote healthy living. The purpose of this lesson is to expose patients to common physical limitations that impact physical activity. Participants will learn practical ways to adapt and manage aches and pains, and to minimize their effect on regular exercise. Patients will learn how to maintain good posture while sitting, walking, and lifting.  Balance Training and Fall Prevention  Clinical staff led group instruction and group discussion with PowerPoint presentation and patient guidebook. To enhance the learning environment the use of posters, models and videos may be added. At the conclusion of this workshop, patients will understand the importance of their sensorimotor skills (vision, proprioception, and the vestibular system) in maintaining their ability to balance as they age. Patients will apply a variety of balancing exercises that are appropriate for their current level of function. Patients will understand the common causes for poor balance, possible solutions to these problems, and ways to modify their physical environment in order to minimize their fall risk. The purpose of this lesson is to teach patients about the importance of maintaining balance as they age and ways to minimize their risk of  falling.  WORKSHOPS   Nutrition:  Fueling a Ship broker led group instruction and group discussion with PowerPoint presentation and patient guidebook.  To enhance the learning environment the use of posters, models and videos may be added. Patients will review the foundational principles of the Pritikin Eating Plan and understand what constitutes a serving size in each of the food groups. Patients will also learn Pritikin-friendly foods that are better choices when away from home and review make-ahead meal and snack options. Calorie density will be reviewed and applied to three nutrition priorities: weight maintenance, weight loss, and weight gain. The purpose of this lesson is to reinforce (in a group setting) the key concepts around what patients are recommended to eat and how to apply these guidelines when away from home by planning and selecting Pritikin-friendly options. Patients will understand how calorie density may be adjusted for different weight management goals.  Mindful Eating  Clinical staff led group instruction and group discussion with PowerPoint presentation and patient guidebook. To enhance the learning environment the use of posters, models and videos may be added. Patients will briefly review the concepts of the Pritikin Eating Plan and the importance of low-calorie dense foods. The concept of mindful eating will be introduced as well as the importance of paying attention to internal hunger signals. Triggers for non-hunger eating and techniques for dealing with triggers will be explored. The purpose of this lesson is to provide patients with the opportunity to review the basic principles of the Pritikin Eating Plan, discuss the value of eating mindfully and how to measure internal cues of hunger and fullness using the Hunger Scale. Patients will also discuss reasons for non-hunger eating and learn strategies to use for controlling emotional eating.  Targeting Your  Nutrition Priorities Clinical staff led group instruction and group discussion with PowerPoint presentation and patient guidebook. To enhance the learning environment the use of posters, models and videos may be added. Patients will learn how to determine their genetic susceptibility to disease by reviewing their family history. Patients will gain insight into the importance of diet as part of an overall healthy lifestyle in mitigating the impact of genetics and other environmental insults. The purpose of this lesson is to provide patients with the opportunity to assess their personal nutrition priorities by looking at their family history, their own health history and current risk factors. Patients will also be able to discuss ways of prioritizing and modifying the Pritikin Eating Plan for their highest risk areas  Menu  Clinical staff led group instruction and group discussion with PowerPoint presentation and patient guidebook. To enhance the learning environment the use of posters, models and videos may be added. Using menus brought in from [employer], or printed from [employer], patients will apply the Pritikin dining out guidelines that were presented in the [employer] video. Patients will also be able to practice these guidelines in a variety of provided scenarios. The purpose of this lesson is to provide patients with the opportunity to practice hands-on learning of the Pritikin Dining Out guidelines with actual menus and practice scenarios.  Label Reading Clinical staff led group instruction and group discussion with PowerPoint presentation and patient guidebook. To enhance the learning environment the use of posters, models and videos may be added. Patients will review and discuss the Pritikin label reading guidelines presented in Pritikin's Label Reading Educational series video. Using fool labels brought in from local grocery stores and markets, patients will apply the  label reading guidelines and determine if the packaged food meet the Pritikin guidelines. The purpose of this lesson is to provide patients with the opportunity to  review, discuss, and practice hands-on learning of the Pritikin Label Reading guidelines with actual packaged food labels. Cooking School  Pritikin's [employer] are designed to teach patients ways to prepare quick, simple, and affordable recipes at home. The importance of nutrition's role in chronic disease risk reduction is reflected in its emphasis in the overall Pritikin program. By learning how to prepare essential core Pritikin Eating Plan recipes, patients will increase control over what they eat; be able to customize the flavor of foods without the use of added salt, sugar, or fat; and improve the quality of the food they consume. By learning a set of core recipes which are easily assembled, quickly prepared, and affordable, patients are more likely to prepare more healthy foods at home. These workshops focus on convenient breakfasts, simple entres, side dishes, and desserts which can be prepared with minimal effort and are consistent with nutrition recommendations for cardiovascular risk reduction. Cooking [employer] are taught by a Armed forces logistics/support/administrative officer (RD) who has been trained by the [employer]. The chef or RD has a clear understanding of the importance of minimizing - if not completely eliminating - added fat, sugar, and sodium in recipes. Throughout the series of Cooking School Workshop sessions, patients will learn about healthy ingredients and efficient methods of cooking to build confidence in their capability to prepare    Cooking School weekly topics:  Adding Flavor- Sodium-Free  Fast and Healthy Breakfasts  Powerhouse Plant-Based Proteins  Satisfying Salads and Dressings  Simple Sides and Sauces  International Cuisine-Spotlight on the [employer] Zones  Delicious Desserts  Savory  Soups  [employer] - Meals in a Astronomer Appetizers and Snacks  Comforting Weekend Breakfasts  One-Pot Wonders   Fast Evening Meals  Landscape architect Your Pritikin Plate  WORKSHOPS   Healthy Mindset (Psychosocial):  Focused Goals, Sustainable Changes Clinical staff led group instruction and group discussion with PowerPoint presentation and patient guidebook. To enhance the learning environment the use of posters, models and videos may be added. Patients will be able to apply effective goal setting strategies to establish at least one personal goal, and then take consistent, meaningful action toward that goal. They will learn to identify common barriers to achieving personal goals and develop strategies to overcome them. Patients will also gain an understanding of how our mind-set can impact our ability to achieve goals and the importance of cultivating a positive and growth-oriented mind-set. The purpose of this lesson is to provide patients with a deeper understanding of how to set and achieve personal goals, as well as the tools and strategies needed to overcome common obstacles which may arise along the way.  From Head to Heart: The Power of a Healthy Outlook  Clinical staff led group instruction and group discussion with PowerPoint presentation and patient guidebook. To enhance the learning environment the use of posters, models and videos may be added. Patients will be able to recognize and describe the impact of emotions and mood on physical health. They will discover the importance of self-care and explore self-care practices which may work for them. Patients will also learn how to utilize the 4 C's to cultivate a healthier outlook and better manage stress and challenges. The purpose of this lesson is to demonstrate to patients how a healthy outlook is an essential part of maintaining good health, especially as they continue their cardiac rehab journey.  Healthy  Sleep for a Healthy Heart Clinical staff led group instruction and  group discussion with PowerPoint presentation and patient guidebook. To enhance the learning environment the use of posters, models and videos may be added. At the conclusion of this workshop, patients will be able to demonstrate knowledge of the importance of sleep to overall health, well-being, and quality of life. They will understand the symptoms of, and treatments for, common sleep disorders. Patients will also be able to identify daytime and nighttime behaviors which impact sleep, and they will be able to apply these tools to help manage sleep-related challenges. The purpose of this lesson is to provide patients with a general overview of sleep and outline the importance of quality sleep. Patients will learn about a few of the most common sleep disorders. Patients will also be introduced to the concept of "sleep hygiene," and discover ways to self-manage certain sleeping problems through simple daily behavior changes. Finally, the workshop will motivate patients by clarifying the links between quality sleep and their goals of heart-healthy living.   Recognizing and Reducing Stress Clinical staff led group instruction and group discussion with PowerPoint presentation and patient guidebook. To enhance the learning environment the use of posters, models and videos may be added. At the conclusion of this workshop, patients will be able to understand the types of stress reactions, differentiate between acute and chronic stress, and recognize the impact that chronic stress has on their health. They will also be able to apply different coping mechanisms, such as reframing negative self-talk. Patients will have the opportunity to practice a variety of stress management techniques, such as deep abdominal breathing, progressive muscle relaxation, and/or guided imagery.  The purpose of this lesson is to educate patients on the role of stress in their  lives and to provide healthy techniques for coping with it.  Learning Barriers/Preferences:  Learning Barriers/Preferences - 08/11/23 1433       Learning Barriers/Preferences   Learning Barriers Sight   reading glasses   Learning Preferences Audio;Computer/Internet;Group Instruction;Individual Instruction;Pictoral;Skilled Demonstration;Verbal Instruction;Video;Written Material             Education Topics:  Knowledge Questionnaire Score:  Knowledge Questionnaire Score - 08/11/23 1433       Knowledge Questionnaire Score   Pre Score 21/24             Core Components/Risk Factors/Patient Goals at Admission:  Personal Goals and Risk Factors at Admission - 08/11/23 1433       Core Components/Risk Factors/Patient Goals on Admission    Weight Management Yes;Obesity;Weight Loss    Intervention Weight Management: Develop a combined nutrition and exercise program designed to reach desired caloric intake, while maintaining appropriate intake of nutrient and fiber, sodium and fats, and appropriate energy expenditure required for the weight goal.;Weight Management: Provide education and appropriate resources to help participant work on and attain dietary goals.;Weight Management/Obesity: Establish reasonable short term and long term weight goals.;Obesity: Provide education and appropriate resources to help participant work on and attain dietary goals.    Expected Outcomes Short Term: Continue to assess and modify interventions until short term weight is achieved;Long Term: Adherence to nutrition and physical activity/exercise program aimed toward attainment of established weight goal;Weight Loss: Understanding of general recommendations for a balanced deficit meal plan, which promotes 1-2 lb weight loss per week and includes a negative energy balance of [phone] kcal/d;Understanding recommendations for meals to include 15-35% energy as protein, 25-35% energy from fat, 35-60% energy from  carbohydrates, less than 200mg  of dietary cholesterol, 20-35 gm of total fiber daily;Understanding of distribution of calorie intake  throughout the day with the consumption of 4-5 meals/snacks    Diabetes Yes    Intervention Provide education about signs/symptoms and action to take for hypo/hyperglycemia.;Provide education about proper nutrition, including hydration, and aerobic/resistive exercise prescription along with prescribed medications to achieve blood glucose in normal ranges: Fasting glucose 65-99 mg/dL    Expected Outcomes Short Term: Participant verbalizes understanding of the signs/symptoms and immediate care of hyper/hypoglycemia, proper foot care and importance of medication, aerobic/resistive exercise and nutrition plan for blood glucose control.;Long Term: Attainment of HbA1C < 7%.    Hypertension Yes    Intervention Provide education on lifestyle modifcations including regular physical activity/exercise, weight management, moderate sodium restriction and increased consumption of fresh fruit, vegetables, and low fat dairy, alcohol moderation, and smoking cessation.;Monitor prescription use compliance.    Expected Outcomes Short Term: Continued assessment and intervention until BP is < 140/101mm HG in hypertensive participants. < 130/72mm HG in hypertensive participants with diabetes, heart failure or chronic kidney disease.;Long Term: Maintenance of blood pressure at goal levels.    Lipids Yes    Intervention Provide education and support for participant on nutrition & aerobic/resistive exercise along with prescribed medications to achieve LDL 70mg , HDL >40mg .    Expected Outcomes Short Term: Participant states understanding of desired cholesterol values and is compliant with medications prescribed. Participant is following exercise prescription and nutrition guidelines.;Long Term: Cholesterol controlled with medications as prescribed, with individualized exercise RX and with personalized  nutrition plan. Value goals: LDL < 70mg , HDL > 40 mg.    Stress Yes    Intervention Offer individual and/or small group education and counseling on adjustment to heart disease, stress management and health-related lifestyle change. Teach and support self-help strategies.;Refer participants experiencing significant psychosocial distress to appropriate mental health specialists for further evaluation and treatment. When possible, include family members and significant others in education/counseling sessions.    Expected Outcomes Short Term: Participant demonstrates changes in health-related behavior, relaxation and other stress management skills, ability to obtain effective social support, and compliance with psychotropic medications if prescribed.;Long Term: Emotional wellbeing is indicated by absence of clinically significant psychosocial distress or social isolation.             Core Components/Risk Factors/Patient Goals Review:   Goals and Risk Factor Review     Row Name 08/18/23 1610 09/09/23 0921 10/11/23 1009         Core Components/Risk Factors/Patient Goals Review   Personal Goals Review Weight Management/Obesity;Hypertension;Lipids;Diabetes Weight Management/Obesity;Hypertension;Lipids;Diabetes Weight Management/Obesity;Hypertension;Lipids;Diabetes     Review Moira Andrews started cardiac rehab on 08/17/23. Moira Andrews did well with exercise for her fitness level. Moira Andrews is somewhat deconditioned and uses a rollator for stability. Moderate exertional systolic Bp's noted. Will continue to monitor BP Moira Andrews started cardiac rehab on 08/17/23. Moira Andrews did well with exercise for her fitness level. Moira Andrews is somewhat deconditioned and uses a rollator for stability. Systolic BP's noted to be in the 150's at cardiac rehab resting. Vital signs have been forwarded to Dr Stann Earnest for review. Moira Andrews continues to do  fair with  exercise for her fitness level. Michelle's vital signs and CBG's have been  stable. Moira Andrews has gained 1 kg since starting cardiac rehab     Expected Outcomes Moira Andrews will continue to participate in cardiac rehab for exercise, nutrition and lifestyle modifications Moira Andrews will continue to participate in cardiac rehab for exercise, nutrition and lifestyle modifications Moira Andrews will continue to participate in cardiac rehab for exercise, nutrition and lifestyle modifications  Core Components/Risk Factors/Patient Goals at Discharge (Final Review):   Goals and Risk Factor Review - 10/11/23 1009       Core Components/Risk Factors/Patient Goals Review   Personal Goals Review Weight Management/Obesity;Hypertension;Lipids;Diabetes    Review Moira Andrews continues to do  fair with  exercise for her fitness level. Michelle's vital signs and CBG's have been stable. Moira Andrews has gained 1 kg since starting cardiac rehab    Expected Outcomes Moira Andrews will continue to participate in cardiac rehab for exercise, nutrition and lifestyle modifications             ITP Comments:  ITP Comments     Row Name 08/11/23 1021 08/18/23 0812 08/23/23 0759 09/09/23 0824 10/11/23 1006   ITP Comments Tamala Fair, MD:  Medical Director.  Introduction to the Pritikin Education Program/ Intensive Cardiac Rehab. Initial oreintation packet reviewed with the patient. 30 Day ITP Review. Moira Andrews started cardiac rehab on 08/17/23. Moira Andrews did well with exercise for her fitness level 30 Day ITP Review. Moira Andrews started cardiac rehab on 08/17/23. Moira Andrews is off to a good start  with exercise for her fitness level 30 Day ITP Review. Moira Andrews has good participation with exercise at cardiac rehab when in attendance 30 Day ITP Review. Moira Andrews continues to have good  participation with exercise at cardiac rehab when in attendance            Comments: See ITP Comments

## 2023-10-19 ENCOUNTER — Encounter (HOSPITAL_COMMUNITY)
Admission: RE | Admit: 2023-10-19 | Discharge: 2023-10-19 | Disposition: A | Discharge status: Home / Self Care | Source: Ambulatory Visit | Attending: Cardiovascular Disease | Admitting: Cardiovascular Disease

## 2023-10-19 DIAGNOSIS — Z952 Presence of prosthetic heart valve: Secondary | ICD-10-CM | POA: Diagnosis not present

## 2023-10-20 ENCOUNTER — Encounter: Payer: Self-pay | Admitting: Cardiovascular Disease

## 2023-10-20 MED ORDER — EZETIMIBE 10 MG PO TABS: 10.0000 mg | ORAL_TABLET | Freq: Every day | ORAL | 3 refills | Status: AC

## 2023-10-20 NOTE — Telephone Encounter (Signed)
 Pt c/o medication issue:  1. Name of Medication:   ondansetron  (ZOFRAN ) 8 MG tablet  ezetimibe  (ZETIA ) 10 MG tablet   2. How are you currently taking this medication (dosage and times per day)?   Not taking  3. Are you having a reaction (difficulty breathing--STAT)?   4. What is your medication issue?   Patient stated she ran out of these medications about 6 weeks ago and wants to know if she should still be taking these medications as they are on her medication list.  Patient stated she will need refills if she still needs to take them sent to [id number] - [place],  [place] - [address].

## 2023-10-20 NOTE — Telephone Encounter (Signed)
 This encounter was created in error - please disregard.

## 2023-10-20 NOTE — Telephone Encounter (Signed)
 Patient was started on zetia  after being in hospital. It should be fine to refill.

## 2023-10-20 NOTE — Telephone Encounter (Signed)
 Zetia  refilled. Pt notified. Pt verbalized understanding. All questions if any were answered.

## 2023-10-21 ENCOUNTER — Other Ambulatory Visit (HOSPITAL_COMMUNITY): Payer: Self-pay

## 2023-10-24 ENCOUNTER — Encounter (HOSPITAL_COMMUNITY)
Admission: RE | Admit: 2023-10-24 | Discharge: 2023-10-24 | Disposition: A | Discharge status: Home / Self Care | Source: Ambulatory Visit | Attending: Cardiovascular Disease | Admitting: Cardiovascular Disease

## 2023-10-24 DIAGNOSIS — Z952 Presence of prosthetic heart valve: Secondary | ICD-10-CM | POA: Diagnosis present

## 2023-10-25 ENCOUNTER — Other Ambulatory Visit: Payer: Self-pay | Admitting: Internal Medicine

## 2023-10-26 ENCOUNTER — Encounter (HOSPITAL_COMMUNITY)
Admission: RE | Admit: 2023-10-26 | Discharge: 2023-10-26 | Disposition: A | Discharge status: Home / Self Care | Source: Ambulatory Visit | Attending: Cardiovascular Disease | Admitting: Cardiovascular Disease

## 2023-10-26 DIAGNOSIS — Z952 Presence of prosthetic heart valve: Secondary | ICD-10-CM

## 2023-10-31 ENCOUNTER — Telehealth (HOSPITAL_COMMUNITY): Payer: Self-pay

## 2023-10-31 ENCOUNTER — Encounter (HOSPITAL_COMMUNITY): Admission: RE | Admit: 2023-10-31 | Source: Ambulatory Visit

## 2023-10-31 NOTE — Telephone Encounter (Signed)
 Patient c/o late for 10:15am class around 11am, missed due to taking family member to graduation rehearsal. Will be in Wednesday.

## 2023-11-01 ENCOUNTER — Ambulatory Visit (INDEPENDENT_AMBULATORY_CARE_PROVIDER_SITE_OTHER): Admitting: Internal Medicine

## 2023-11-01 ENCOUNTER — Other Ambulatory Visit: Payer: Self-pay

## 2023-11-01 ENCOUNTER — Encounter: Payer: Self-pay | Admitting: Internal Medicine

## 2023-11-01 VITALS — BP 139/82 | HR 78 | Resp 16 | Ht <= 58 in | Wt 201.3 lb

## 2023-11-01 DIAGNOSIS — M4620 Osteomyelitis of vertebra, site unspecified: Secondary | ICD-10-CM

## 2023-11-01 NOTE — Progress Notes (Signed)
 [facility]  Patient Active Problem List   Diagnosis Date Noted   S/P AVR (aortic valve replacement) 06/30/2023   Morbid obesity (HCC) 06/29/2023   Dyspnea 06/28/2023   AKI (acute kidney injury) (HCC) 06/19/2023   Complete heart block (HCC) 06/17/2023   Hyperglycemia 06/17/2023   Fever 06/17/2023   S/P AVR (aortic valve replacement) and aortoplasty 06/14/2023   Acute CHF (congestive heart failure) (HCC) 04/28/2023   Acute on chronic heart failure with preserved ejection fraction (HCC) 04/27/2023   Severe aortic stenosis    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   Microcytic anemia 01/25/2023   Class 2 severe obesity due to excess calories with serious comorbidity and body mass index (BMI) of 38.0 to 38.9 in adult (HCC) 07/08/2022   Primary osteoarthritis of right knee 07/08/2022   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   Bacteremia due to Proteus species    Sepsis (HCC) 02/03/2022   Iron  deficiency anemia 02/03/2022   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   S/P lumbar spinal fusion 11/17/2021   Lumbar radiculopathy 04/21/2021   Type 2 diabetes mellitus without complication, without long-term current use of insulin  (HCC) 12/22/2018   Hypertriglyceridemia 10/19/2018   Spondylolisthesis of lumbar region 02/01/2018   Gastroesophageal reflux disease without esophagitis 01/16/2018   Skin inflammation 08/25/2017   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   MRN: [id number]  No chief complaint on file.    HPI:  [name]  [name] is a 68 y.o. female here for f/u hx lumbar surgical site  infection/lumbar OM with hardware associated infection  She had associated bacteremia proteus mirabilis blood stream infection 02/03/2022, in setting mri imaging lumbar surgical site abscess/cellulitis. S/p I&D with all old hardware removed but new screws placed on 9/15  04/07/22 id clinic visit She had finished iv ceftriaxone  of 8 weeks by 04/02/2022 and transitioned to bactrim  ss bid; the cefazolin  mic was 8  She walks with fww. She is sore in lower back bilaterally when moving but nothing in the middle. This is chronic. 7 at worst; 2/10 resting   No fever, chill, diarrhea   05/05/22 id clinic visit She had chart hx of sulfa  allergy although she doesn't recall any bactrim  use or other sulfa  product use in the past She tolerated bactrim  ss bid fine without n/v/diarrhea/rash Back pain is stable No f/c  She complains of 3-4 weeks right anterior thigh numbness/tingling. She wears tight clothing now and then but not consistently.   She has right lower ext weakness after back surgery but that is getting better. Lower back pain 3/10 not bad. Bilateral lower back sore/stiff after pt/ot session though. Overall since back surgery 01/2022 leg weakness/back pain better. Walks with walker still  Also complains of chronic tongue burning with things like toothpaste, spicy food. Sensation transient. Doesn't disturb her in other ways. This was heard while she was on ceftriaxone  as well.      10/26/22 id clinic f/u Crp has been high still. She continues on bactrim  ds 1 tab bid She still have significant nausea but controlled with  zofran  Back pain 2/10 minimal like before We spoke about trial off medication abx 07/2022 but given crp will keep going, until we get 2 normal over 6 months span before considering it again    04/26/23 id clinic f/u    Component Value Date/Time   CRP 31.1 (H) 08/09/2023 1119   CRP 2.7 (H) 06/29/2023 1844   CRP 30.7 (H) 04/26/2023 1053   Lab Results  Component Value  Date   CREATININE 1.58 (H) 08/09/2023   Patient is here for f/u today She has been seeing cardiology for aortic stenosis planned for January Patient still taking bactrim  suppressive abx She said she has gained a lot of weight and more dyspnea since seeing cardiology 2-3 weeks ago -- she hasn't spoken with them yet  She doesnot take any loop diuretics  The back is good. Right leg anterior pain stable/chronic since surgery No fever/chill  Appetite is too good.   08/09/23 id clinic f/u Reviewed 06/2023 labs Crp improving Doing well with back no new pain No f/c  She is s/p TAVR in 05/2023. Doing well with that -- no more dyspnea. And also the night pain in the hands also improves. No swelling in legs   Continues to take bactrim   Patient is on eliquis  for afib    11/01/23 id clinic f/u Mostly use cane; sometimes fww See a&p   Allergies: Allergies  Allergen Reactions   Crestor  [Rosuvastatin ] Other (See Comments)    Myalgia; patient can only tolerate taking 10 mg every other day   Robaxin  [Methocarbamol ] Other (See Comments)    Insomnia   2,4-D Dimethylamine    Toradol  [Ketorolac  Tromethamine ] Other (See Comments)   Zocor [Simvastatin] Other (See Comments)    Myalgias    Lipitor [Atorvastatin ] Other (See Comments)    Myalgia   Sulfa  Antibiotics Hives and Nausea And Vomiting      Outpatient Medications Prior to Visit  Medication Sig Dispense Refill   celecoxib  (CELEBREX ) 200 MG capsule Take by mouth 2 (two) times daily.     acetaminophen  (TYLENOL ) 500 MG tablet Take 1,000 mg by mouth every 6 (six) hours as needed for mild pain (pain score 1-3) or headache.     albuterol  (PROAIR  HFA) 108 (90 BASE) MCG/ACT inhaler Inhale 2 puffs into the lungs every 6 (six) hours as needed. 1 Inhaler 12   amiodarone  (PACERONE ) 200 MG tablet Take 1 tablet (200 mg total) by mouth daily. 90 tablet 3   amLODipine  (NORVASC ) 10 MG tablet Take 1 tablet (10 mg total) by mouth daily. 90  tablet 1   amoxicillin  (AMOXIL ) 500 MG tablet Take 4 tablets (2000 mg) by mouth one hour prior to dental procedure. 4 tablet 1   apixaban  (ELIQUIS ) 5 MG TABS tablet Take 1 tablet (5 mg total) by mouth 2 (two) times daily. 60 tablet 3   ascorbic acid (VITAMIN C) 500 MG tablet Take 500 mg by mouth daily.     aspirin  EC 81 MG tablet Take 1 tablet (81 mg total) by mouth daily. Swallow whole.     cetirizine  (ZYRTEC ) 10 MG tablet Take 1 tablet (10 mg total) by mouth daily. 30 tablet 0   docusate sodium  (COLACE) 100 MG capsule Take 200-300 mg by mouth at bedtime.     ezetimibe  (ZETIA ) 10 MG tablet Take 1 tablet (10 mg total) by mouth daily. 90 tablet 3   fluticasone  (FLONASE ) 50 MCG/ACT nasal spray Place 2 sprays into both nostrils daily. (Patient taking differently: Place 2  sprays into both nostrils daily as needed for rhinitis.) 16 g 0   furosemide  (LASIX ) 40 MG tablet Take 1 tablet (40 mg total) by mouth daily as needed for edema or fluid (shortness of breath). 90 tablet 3   gabapentin  (NEURONTIN ) 600 MG tablet Take 600 mg by mouth 3 (three) times daily.     iron  polysaccharides (NIFEREX) 150 MG capsule TAKE 1 CAPSULE (150 MG DOSE) BY MOUTH TWICE A DAY (Patient taking differently: Take 150 mg by mouth daily.) 180 capsule 1   losartan  (COZAAR ) 100 MG tablet Take 1 tablet (100 mg total) by mouth daily. 30 tablet 1   metFORMIN  (GLUCOPHAGE ) 1000 MG tablet Take 1 tablet (1,000 mg total) by mouth 2 (two) times daily with a meal. 180 tablet 1   metoprolol  tartrate (LOPRESSOR ) 25 MG tablet Take 1 tablet (25 mg total) by mouth 2 (two) times daily. 180 tablet 3   ondansetron  (ZOFRAN ) 8 MG tablet Take 8 mg by mouth 3 (three) times daily.     pantoprazole  (PROTONIX ) 40 MG tablet Take 40 mg by mouth daily.     polyvinyl alcohol (LIQUIFILM TEARS) 1.4 % ophthalmic solution Place 1 drop into both eyes as needed for dry eyes.     rosuvastatin  (CRESTOR ) 10 MG tablet Take 1 tablet (10 mg total) by mouth every other day.  45 tablet 3   Semaglutide , 2 MG/DOSE, (OZEMPIC , 2 MG/DOSE,) 8 MG/3ML SOPN Inject 2 mg into the skin once a week.     sulfamethoxazole -trimethoprim  (BACTRIM ) 400-80 MG tablet TAKE 1 TABLET BY MOUTH TWICE DAILY 60 tablet 0   VEOZAH 45 MG TABS Take 1 tablet by mouth daily.     No facility-administered medications prior to visit.     Social History   Socioeconomic History   Marital status: Married    Spouse name: Not on file   Number of children: 3   Years of education: Not on file   Highest education level: Not on file  Occupational History   Not on file  Tobacco Use   Smoking status: Never   Smokeless tobacco: Never  Vaping Use   Vaping status: Never Used  Substance and Sexual Activity   Alcohol use: No    Alcohol/week: 0.0 standard drinks of alcohol   Drug use: No   Sexual activity: Yes  Other Topics Concern   Not on file  Social History Narrative   Not on file   Social Drivers of Health   Financial Resource Strain: Low Risk  (10/09/2023)   Received from [facility]   Overall Financial Resource Strain (CARDIA)    Difficulty of Paying Living Expenses: Not very hard  Food Insecurity: No Food Insecurity (10/09/2023)   Received from [facility]   Hunger Vital Sign    Worried About Running Out of Food in the Last Year: Never true    Ran Out of Food in the Last Year: Never true  Transportation Needs: No Transportation Needs (10/09/2023)   Received from [facility] - Transportation    Lack of Transportation (Medical): No    Lack of Transportation (Non-Medical): No  Physical Activity: Insufficiently Active (10/09/2023)   Received from [facility]   Exercise Vital Sign    Days of Exercise per Week: 3 days    Minutes of Exercise per Session: 20 min  Stress: No Stress Concern Present (10/09/2023)   Received from [facility] - Occupational Stress Questionnaire  Feeling of Stress : Only a little  Social  Connections: Somewhat Isolated (10/09/2023)   Received from [facility]   Social Network    How would you rate your social network (family, work, friends)?: Restricted participation with some degree of social isolation  Intimate Partner Violence: Not At Risk (10/09/2023)   Received from [facility]   HITS    Over the last 12 months how often did your partner physically hurt you?: Never    Over the last 12 months how often did your partner insult you or talk down to you?: Never    Over the last 12 months how often did your partner threaten you with physical harm?: Never    Over the last 12 months how often did your partner scream or curse at you?: Never      Review of Systems    All other ros negative Objective:    Resp 16   Ht 4' 10 (1.473 m)   BMI 41.70 kg/m  Nursing note and vital signs reviewed.  Physical Exam  General/constitutional: no distress, pleasant; walks with cane HEENT: Normocephalic, PER, Conj Clear, EOMI, Oropharynx clear Neck supple CV: rrr no mrg Lungs: clear to auscultation, normal respiratory effort Abd: Soft, Nontender Ext: facial plethora; bilateral LE trace edema Skin: No Rash Neuro: nonfocal MSK: no peripheral joint swelling/tenderness/warmth; back spines nontender      Labs: Lab Results  Component Value Date   WBC 6.1 08/09/2023   HGB 10.2 (L) 08/09/2023   HCT 33.3 (L) 08/09/2023   MCV 79.3 (L) 08/09/2023   PLT 273 08/09/2023   Last metabolic panel Lab Results  Component Value Date   GLUCOSE 85 08/09/2023   NA 145 08/09/2023   K 4.4 08/09/2023   CL 108 08/09/2023   CO2 26 08/09/2023   BUN 25 08/09/2023   CREATININE 1.58 (H) 08/09/2023   GFRNONAA 43 (L) 07/01/2023   CALCIUM  9.7 08/09/2023   PHOS 3.7 06/17/2023   PROT 7.3 08/09/2023   ALBUMIN  3.8 06/14/2023   LABGLOB 2.7 02/03/2023   AGRATIO 1.7 08/19/2017   BILITOT 0.2 08/09/2023   ALKPHOS 69 06/14/2023   AST 19 08/09/2023   ALT 18 08/09/2023   ANIONGAP 11 07/01/2023    Crp: 08/03/22     26.3   (<8) 04/2022    20.8   (<8)   Micro:  Serology:  Imaging: 03/31/22 mri lumbar spine 1. Postoperative changes since the prior MRI. No findings suspicious for persistent discitis, epidural abscess or paraspinal abscess. 2. New marrow signal abnormality in the L2 and L3 vertebral bodies and subsequent enhancement but no enhancement in the disc space to suggest persistent discitis. This could be remodeling changes related to the recent surgery and new hardware. 3. Some residual prevertebral inflammatory changes but no rim enhancing abscess.  Assessment & Plan:   Problem List Items Addressed This Visit   None      No orders of the defined types were placed in this encounter.    #Post-op lumbar infection SP I&D and HW removal with Cx+ Proteus mirabilis  on 02/05/22; and placement of new screws into infected spine #Psoas abscess #Proteus mirabilis bacteremia  -02/03/22 blood cultures 1/2 Proteus mirabilis.  Initially started on vancomycin , cefepime , metronidazole . - initial MRI L-spine showed epidural abscess extending anteriorly from soft tissue to fusion hardware L3-L4. 1.1x0.8 cm focal area in right psoas muscle suspicious for small abscess - Underwent I&D with hardware removal on 9/15 with neurosurgery Dr. Michale Age.  Of note  all the old hardware was removed new pedicle screws were placed.  ID was engaged and plan for 8 weeks of antibiotics more.  OR cultures grew Proteus mirabilis with cefazolin  MIC 8  as such switched  cefazolin  to ceftriaxone .   - s/p 8 weeks ceftriaxone  by 11/10, switched to bactrim  ss bid chronically  05/05/22 id assessment Repeat mri lumbar spine 03/31/22 showed no further abscess Clinically also improving   08/03/22 id clinic assessment Doing very well Discuss with her trial off abx Labs today and f/u 6-8 weeks when she'll decide on abx continuation  10/26/22 id clinic assessment Due to still elevated crp without obvious  explanation, and despite minimal back pain, will keep going with bactrim  for now until we have 2 normal at least 6 months apart She would like more zofran  amount in hand for her nausea with bactrim  Labs today for toxicity monitoring and crp monitoring Follow up 6 months Continue bactrim  1 ds tablet twice a day  04/26/23 id clinic assessment Back seems to be doing well AS awaiting tavr -- her infection is suppressed and shouldn't be a contraindication for tavr if needed Labs today Will revisit trial off abx at the appropriate time (after tavr and only with persistently normal crp) Continue bactrim  SS bid suppression   Advise patient to call cardiology clinic regarding recent more rapid weight gain/dyspnea.  08/09/23 id clinic assessment S/p tavr 05/2023 On eliquis  for afib Tolerating bactrim  No new sx with lower back  We discussed trial off abx for the back previously. For now given recent tavr I want to make sure she does well on that with cardiac rehab before considering trial off abx bactrim   Labs today  F/u 10 weeks   11/01/23 id clinic assessment Crp down trended and up trended 08/09/23 unclear reason Will repeat today She is 6 months out from tavr Since I spoke with her 07/2023 nothing changed. Feeling well. Started cardiac rehab. Breathing a lot better along with activity tolerance No fever, chill No back pain, however has right lateral/anterior thigh pain. She does notice some weakness in the right leg after walking a while -- stable since surgery  She uses nervive for the pain which helps -- continue as needed  If numbers crp still high will scan the back If crp goes down and normalize x2 consecutive occasion, I'll feel better taking her off medication  F/u 3 months   ----------- Addendum Rising crp/esr but stable from last visit Can't explain it Will repeat mri l-spine  Follow-up: Return in about 3 months (around 02/01/2024).      Jamesetta Mcbride, MD [facility] 11/01/2023, 11:18 AM

## 2023-11-01 NOTE — Patient Instructions (Signed)
 I am not sure why your inflammation is up still  Let's check another one today, if its still very high will scan your lower back  See me again in 3 months

## 2023-11-02 ENCOUNTER — Encounter (HOSPITAL_COMMUNITY)
Admission: RE | Admit: 2023-11-02 | Discharge: 2023-11-02 | Disposition: A | Discharge status: Home / Self Care | Source: Ambulatory Visit | Attending: Cardiovascular Disease | Admitting: Cardiovascular Disease

## 2023-11-02 DIAGNOSIS — Z952 Presence of prosthetic heart valve: Secondary | ICD-10-CM

## 2023-11-02 LAB — SEDIMENTATION RATE: Sed Rate: 33 mm/h — ABNORMAL HIGH (ref 0–30)

## 2023-11-02 LAB — C-REACTIVE PROTEIN: CRP: 35.2 mg/L — ABNORMAL HIGH (ref <8.0)

## 2023-11-03 ENCOUNTER — Ambulatory Visit: Payer: Self-pay | Admitting: Internal Medicine

## 2023-11-03 NOTE — Addendum Note (Signed)
 Addended byCephas Collier T on: 11/03/2023 09:52 AM   Modules accepted: Orders

## 2023-11-07 ENCOUNTER — Encounter (HOSPITAL_COMMUNITY)
Admission: RE | Admit: 2023-11-07 | Discharge: 2023-11-07 | Disposition: A | Discharge status: Home / Self Care | Source: Ambulatory Visit | Attending: Cardiovascular Disease | Admitting: Cardiovascular Disease

## 2023-11-07 DIAGNOSIS — Z952 Presence of prosthetic heart valve: Secondary | ICD-10-CM | POA: Diagnosis not present

## 2023-11-08 ENCOUNTER — Telehealth: Payer: Self-pay | Admitting: Cardiovascular Disease

## 2023-11-08 NOTE — Telephone Encounter (Signed)
 Spoke to patient she stated she has been depressed for the past 2 months.No chest pain.Advised she needs to see PCP.Advised she needs to keep appointment already scheduled with Dayna Dunn PA 6/25 at 1:30 pm.Advised to bring a list of all medications.

## 2023-11-08 NOTE — Telephone Encounter (Signed)
 Patient is calling stating she has been depressed for the past month and feels it maybe due to a medication. Please advise.

## 2023-11-08 NOTE — Progress Notes (Signed)
 Cardiac Individual Treatment Plan  Patient Details  Name: [name] MRN: [id number] Date of Birth: 07-15-1955 Referring Provider:   Flowsheet Row INTENSIVE CARDIAC REHAB ORIENT from 08/11/2023 in [facility]  Referring Provider Janelle Mediate, MD    Initial Encounter Date:  Flowsheet Row INTENSIVE CARDIAC REHAB ORIENT from 08/11/2023 in [facility]  Date 08/11/23    Visit Diagnosis: 06/14/23 S/P AVR (aortic valve replacement)  Patient's Home Medications on Admission:  Current Outpatient Medications:    acetaminophen  (TYLENOL ) 500 MG tablet, Take 1,000 mg by mouth every 6 (six) hours as needed for mild pain (pain score 1-3) or headache., Disp: , Rfl:    albuterol  (PROAIR  HFA) 108 (90 BASE) MCG/ACT inhaler, Inhale 2 puffs into the lungs every 6 (six) hours as needed., Disp: 1 Inhaler, Rfl: 12   amiodarone  (PACERONE ) 200 MG tablet, Take 1 tablet (200 mg total) by mouth daily., Disp: 90 tablet, Rfl: 3   amLODipine  (NORVASC ) 10 MG tablet, Take 1 tablet (10 mg total) by mouth daily., Disp: 90 tablet, Rfl: 1   amoxicillin  (AMOXIL ) 500 MG tablet, Take 4 tablets (2000 mg) by mouth one hour prior to dental procedure., Disp: 4 tablet, Rfl: 1   apixaban  (ELIQUIS ) 5 MG TABS tablet, Take 1 tablet (5 mg total) by mouth 2 (two) times daily., Disp: 60 tablet, Rfl: 3   ascorbic acid (VITAMIN C) 500 MG tablet, Take 500 mg by mouth daily., Disp: , Rfl:    aspirin  EC 81 MG tablet, Take 1 tablet (81 mg total) by mouth daily. Swallow whole., Disp: , Rfl:    celecoxib  (CELEBREX ) 200 MG capsule, Take by mouth 2 (two) times daily., Disp: , Rfl:    cetirizine  (ZYRTEC ) 10 MG tablet, Take 1 tablet (10 mg total) by mouth daily., Disp: 30 tablet, Rfl: 0   docusate sodium  (COLACE) 100 MG capsule, Take 200-300 mg by mouth at bedtime., Disp: , Rfl:    ezetimibe  (ZETIA ) 10 MG tablet, Take 1 tablet (10  mg total) by mouth daily., Disp: 90 tablet, Rfl: 3   fluticasone  (FLONASE ) 50 MCG/ACT nasal spray, Place 2 sprays into both nostrils daily. (Patient taking differently: Place 2 sprays into both nostrils daily as needed for rhinitis.), Disp: 16 g, Rfl: 0   furosemide  (LASIX ) 40 MG tablet, Take 1 tablet (40 mg total) by mouth daily as needed for edema or fluid (shortness of breath)., Disp: 90 tablet, Rfl: 3   gabapentin  (NEURONTIN ) 600 MG tablet, Take 600 mg by mouth 3 (three) times daily., Disp: , Rfl:    iron  polysaccharides (NIFEREX) 150 MG capsule, TAKE 1 CAPSULE (150 MG DOSE) BY MOUTH TWICE A DAY (Patient taking differently: Take 150 mg by mouth daily.), Disp: 180 capsule, Rfl: 1   losartan  (COZAAR ) 100 MG tablet, Take 1 tablet (100 mg total) by mouth daily., Disp: 30 tablet, Rfl: 1   [Paused] metFORMIN  (GLUCOPHAGE ) 1000 MG tablet, Take 1 tablet (1,000 mg total) by mouth 2 (two) times daily with a meal., Disp: 180 tablet, Rfl: 1   metoprolol  tartrate (LOPRESSOR ) 25 MG tablet, Take 1 tablet (25 mg total) by mouth 2 (two) times daily., Disp: 180 tablet, Rfl: 3   ondansetron  (ZOFRAN ) 8 MG tablet, Take 8 mg by mouth 3 (three) times daily., Disp: , Rfl:    pantoprazole  (PROTONIX ) 40 MG tablet, Take 40 mg by mouth daily., Disp: , Rfl:  polyvinyl alcohol (LIQUIFILM TEARS) 1.4 % ophthalmic solution, Place 1 drop into both eyes as needed for dry eyes., Disp: , Rfl:    rosuvastatin  (CRESTOR ) 10 MG tablet, Take 1 tablet (10 mg total) by mouth every other day., Disp: 45 tablet, Rfl: 3   Semaglutide , 2 MG/DOSE, (OZEMPIC , 2 MG/DOSE,) 8 MG/3ML SOPN, Inject 2 mg into the skin once a week., Disp: , Rfl:    sulfamethoxazole -trimethoprim  (BACTRIM ) 400-80 MG tablet, TAKE 1 TABLET BY MOUTH TWICE DAILY, Disp: 60 tablet, Rfl: 0   VEOZAH 45 MG TABS, Take 1 tablet by mouth daily., Disp: , Rfl:   Past Medical History: Past Medical History:  Diagnosis Date   Allergy    Anemia    Anxiety    Arthritis    Asthma     BMI 40.0-44.9, adult (HCC) 04/07/2014   DM type 2 (diabetes mellitus, type 2) (HCC)    GERD (gastroesophageal reflux disease)    HTN (hypertension)    Hypercholesterolemia    Osteoarthritis of left hip 04/07/2014   Reflux    Severe aortic stenosis    Spinal headache    with C-Section and with spinal fusion in 2019    Tobacco Use: Social History   Tobacco Use  Smoking Status Never  Smokeless Tobacco Never    Labs: Review Flowsheet  More data exists      Latest Ref Rng & Units 02/03/2023 04/27/2023 04/28/2023 05/24/2023 06/14/2023  Labs for ITP Cardiac and Pulmonary Rehab  Cholestrol 100 - 199 mg/dL 161  - - - -  LDL (calc) 0 - 99 mg/dL 86  - - - -  Direct LDL 0 - 99 mg/dL - 096  - - -  HDL-C >04 mg/dL 43  - - - -  Trlycerides 0 - 149 mg/dL 540  - - - -  Hemoglobin A1c 4.8 - 5.6 % - - 6.2  5.9  -  PH, Arterial 7.35 - 7.45 - - - - 7.313  7.297  7.319  7.263  7.295  7.434  7.450  7.404   PCO2 arterial 32 - 48 mmHg - - - - 52.4  48.8  50.6  54.5  50.1  37.5  38.2  45.6   Bicarbonate 20.0 - 28.0 mmol/L - - - - 26.5  24.0  26.2  24.9  24.4  25.1  26.6  28.2  28.6   TCO2 22 - 32 mmol/L - - - - 28  25  28  27  23  26  25  26  26  28  27  30  30  27  26    Acid-base deficit 0.0 - 2.0 mmol/L - - - - 3.0  1.0  3.0  2.0   O2 Saturation % - - - - 98  98  97  87  100  100  100  63  100     Details       Multiple values from one day are sorted in reverse-chronological order         Capillary Blood Glucose: Lab Results  Component Value Date   GLUCAP 108 (H) 08/31/2023   GLUCAP 82 08/24/2023   GLUCAP 126 (H) 08/22/2023   GLUCAP 83 08/17/2023   GLUCAP 89 08/17/2023     Exercise Target Goals: Exercise Program Goal: Individual exercise prescription set using results from initial 6 min walk test and THRR while considering  patient's activity barriers and safety.   Exercise Prescription Goal: Initial exercise prescription builds to  30-45 minutes a day of aerobic activity, 2-3 days  per week.  Home exercise guidelines will be given to patient during program as part of exercise prescription that the participant will acknowledge.  Activity Barriers & Risk Stratification:  Activity Barriers & Cardiac Risk Stratification - 08/11/23 1421       Activity Barriers & Cardiac Risk Stratification   Activity Barriers Balance Concerns;Arthritis;Joint Problems;History of Falls;Back Problems;Deconditioning;Assistive Device;Muscular Weakness;Shortness of Breath;Neck/Spine Problems    Cardiac Risk Stratification High          6 Minute Walk:  6 Minute Walk     Row Name 08/11/23 1419         6 Minute Walk   Phase Initial  Pt used her rollator     Distance 25.53 feet     Walk Time 6 minutes     # of Rest Breaks 1  Pt stopped at 5:40, 20 seconds early     MPH 1.82     METS 2.15     RPE 13     Perceived Dyspnea  1     VO2 Peak 7.53     Symptoms Yes (comment)     Comments 5/10 right knee pain     Resting HR 83 bpm     Resting BP 132/70     Resting Oxygen Saturation  98 %     Exercise Oxygen Saturation  during 6 min walk 99 %     Max Ex. HR 105 bpm     Max Ex. BP 180/90     2 Minute Post BP 134/80        Oxygen Initial Assessment:   Oxygen Re-Evaluation:   Oxygen Discharge (Final Oxygen Re-Evaluation):   Initial Exercise Prescription:  Initial Exercise Prescription - 08/11/23 1400       Date of Initial Exercise RX and Referring Provider   Date 08/11/23    Referring Provider Janelle Mediate, MD    Expected Discharge Date 11/02/23      NuStep   Level 3    SPM 75    Minutes 25    METs 2.1      Prescription Details   Frequency (times per week) 3    Duration Progress to 30 minutes of continuous aerobic without signs/symptoms of physical distress      Intensity   THRR 40-80% of Max Heartrate 62-123    Ratings of Perceived Exertion 11-13    Perceived Dyspnea 0-4      Progression   Progression Continue progressive overload as per policy without  signs/symptoms or physical distress.      Resistance Training   Training Prescription Yes    Weight 2 lbs    Reps 10-15          Perform Capillary Blood Glucose checks as needed.  Exercise Prescription Changes:   Exercise Prescription Changes     Row Name 08/17/23 1024 08/22/23 1015 09/05/23 1031 09/19/23 1031 10/03/23 1031     Response to Exercise   Blood Pressure (Admit) 118/80 118/80 156/82 124/70 166/80   Blood Pressure (Exercise) 166/80 132/80 158/80 -- --   Blood Pressure (Exit) 134/78 150/81 118/76 118/76 130/80   Heart Rate (Admit) 85 bpm 87 bpm 82 bpm 91 bpm 80 bpm   Heart Rate (Exercise) 94 bpm 98 bpm 107 bpm 103 bpm 108 bpm   Heart Rate (Exit) 81 bpm 90 bpm 91 bpm 91 bpm 89 bpm   Rating of Perceived Exertion (Exercise) 9 11 11  10  12   Symptoms None None None None None   Comments Off to a good start with exercise. -- Will increase workload on NuStep next session from 3 to 4. Reviewed goals with [name]. Started the arm ergometer today.   Duration Progress to 30 minutes of  aerobic without signs/symptoms of physical distress Continue with 30 min of aerobic exercise without signs/symptoms of physical distress. Continue with 30 min of aerobic exercise without signs/symptoms of physical distress. Continue with 30 min of aerobic exercise without signs/symptoms of physical distress. Continue with 30 min of aerobic exercise without signs/symptoms of physical distress.   Intensity THRR unchanged THRR unchanged THRR unchanged THRR unchanged THRR unchanged     Progression   Progression Continue to progress workloads to maintain intensity without signs/symptoms of physical distress. Continue to progress workloads to maintain intensity without signs/symptoms of physical distress. Continue to progress workloads to maintain intensity without signs/symptoms of physical distress. Continue to progress workloads to maintain intensity without signs/symptoms of physical distress. Continue to  progress workloads to maintain intensity without signs/symptoms of physical distress.   Average METs 1.6 1.5 1.5 1.5 1.5     Resistance Training   Training Prescription No Yes Yes Yes Yes   Weight Relaxation day, no weights. 2 lbs 2 lbs 2 lbs 2 lbs   Reps -- 10-15 10-15 10-15 10-15   Time -- 10 Minutes 10 Minutes 10 Minutes 10 Minutes     Interval Training   Interval Training No No No No No     NuStep   Level 1 1 3 4 5    SPM 124 124 131 117 109   Minutes 25 30 30 30 15    METs 1.6 1.5 1.5 1.5 1.5     Arm Ergometer   Level -- -- -- -- 1   Watts -- -- -- -- 8   Minutes -- -- -- -- 15   METs -- -- -- -- 1.4     Home Exercise Plan   Plans to continue exercise at -- -- -- Home (comment)  Walking Home (comment)  Walking   Frequency -- -- -- Add 2 additional days to program exercise sessions. Add 2 additional days to program exercise sessions.   Initial Home Exercises Provided -- -- -- -- 09/26/23    Row Name 10/19/23 1037 11/02/23 1027           Response to Exercise   Blood Pressure (Admit) 138/68 132/70      Blood Pressure (Exit) 120/70 114/68      Heart Rate (Admit) 84 bpm 90 bpm      Heart Rate (Exercise) 108 bpm 100 bpm      Heart Rate (Exit) 92 bpm 93 bpm      Rating of Perceived Exertion (Exercise) 13 12      Symptoms None None      Duration Continue with 30 min of aerobic exercise without signs/symptoms of physical distress. Continue with 30 min of aerobic exercise without signs/symptoms of physical distress.      Intensity THRR unchanged THRR unchanged        Progression   Progression Continue to progress workloads to maintain intensity without signs/symptoms of physical distress. Continue to progress workloads to maintain intensity without signs/symptoms of physical distress.      Average METs 1.4 1.5        Resistance Training   Training Prescription No No      Weight Relaxation day, no weights. Relaxation day, no weights.  Interval Training   Interval  Training No No        NuStep   Level 5 6      SPM -- 107      Minutes 15 15      METs -- 1.7        Arm Ergometer   Level 1 1      Watts 7 6      Minutes 15 15      METs 1.4 1.3        Home Exercise Plan   Plans to continue exercise at Home (comment)  Walking Home (comment)  Walking      Frequency Add 2 additional days to program exercise sessions. Add 2 additional days to program exercise sessions.      Initial Home Exercises Provided 09/26/23 09/26/23         Exercise Comments:   Exercise Comments     Row Name 08/17/23 1130 09/19/23 1117 09/26/23 1100 09/28/23 1107 10/03/23 1110   Exercise Comments Paitent tolerated first exercise session well without symptoms. Oriented her to the stretching routine, modified to seated when needed. Reviewed goals with [name]. Reviewed home exercise guidelines and goals with [name]. [name] continues to progress well with exercise. Discussed trying an arm ergometer, and she is interested in starting the AE next week. [name] started the arm ergometer today and tolerated well without symptoms.    Row Name 10/24/23 1120 11/07/23 1116         Exercise Comments Reviewed goals with [name]. Reviewed METs goals with [name].         Exercise Goals and Review:   Exercise Goals     Row Name 08/11/23 1422             Exercise Goals   Increase Physical Activity Yes       Intervention Provide advice, education, support and counseling about physical activity/exercise needs.;Develop an individualized exercise prescription for aerobic and resistive training based on initial evaluation findings, risk stratification, comorbidities and participant's personal goals.       Expected Outcomes Short Term: Attend rehab on a regular basis to increase amount of physical activity.;Long Term: Exercising regularly at least 3-5 days a week.;Long Term: Add in home exercise to make exercise part of routine and to increase amount of physical activity.        Increase Strength and Stamina Yes       Intervention Provide advice, education, support and counseling about physical activity/exercise needs.;Develop an individualized exercise prescription for aerobic and resistive training based on initial evaluation findings, risk stratification, comorbidities and participant's personal goals.       Expected Outcomes Short Term: Increase workloads from initial exercise prescription for resistance, speed, and METs.;Short Term: Perform resistance training exercises routinely during rehab and add in resistance training at home;Long Term: Improve cardiorespiratory fitness, muscular endurance and strength as measured by increased METs and functional capacity ( )       Able to understand and use rate of perceived exertion (RPE) scale Yes       Intervention Provide education and explanation on how to use RPE scale       Expected Outcomes Short Term: Able to use RPE daily in rehab to express subjective intensity level;Long Term:  Able to use RPE to guide intensity level when exercising independently       Knowledge and understanding of Target Heart Rate Range (THRR) Yes       Intervention Provide education and explanation of  THRR including how the numbers were predicted and where they are located for reference       Expected Outcomes Short Term: Able to state/look up THRR;Long Term: Able to use THRR to govern intensity when exercising independently;Short Term: Able to use daily as guideline for intensity in rehab       Understanding of Exercise Prescription Yes       Intervention Provide education, explanation, and written materials on patient's individual exercise prescription       Expected Outcomes Short Term: Able to explain program exercise prescription;Long Term: Able to explain home exercise prescription to exercise independently          Exercise Goals Re-Evaluation :  Exercise Goals Re-Evaluation     Row Name 08/17/23 1130 09/19/23 1117 09/26/23 1100  10/24/23 1120 11/07/23 1116     Exercise Goal Re-Evaluation   Exercise Goals Review Increase Physical Activity;Increase Strength and Stamina;Able to understand and use rate of perceived exertion (RPE) scale Increase Physical Activity;Increase Strength and Stamina;Able to understand and use rate of perceived exertion (RPE) scale Increase Physical Activity;Increase Strength and Stamina;Able to understand and use rate of perceived exertion (RPE) scale;Knowledge and understanding of Target Heart Rate Range (THRR);Understanding of Exercise Prescription Increase Physical Activity;Increase Strength and Stamina;Able to understand and use rate of perceived exertion (RPE) scale;Knowledge and understanding of Target Heart Rate Range (THRR);Understanding of Exercise Prescription Increase Physical Activity;Increase Strength and Stamina;Able to understand and use rate of perceived exertion (RPE) scale;Knowledge and understanding of Target Heart Rate Range (THRR);Understanding of Exercise Prescription   Comments [name]  was able to understand and use RPE scale appropriately. [name] is walking 20 minutes 2 days/week in addition to exercise at cardiac rehab. Reviewed exercise prescription with [name]. She is walking 20 minutes 2 days/week but is limited by knee and leg pain. She will look into seated exercise that she can do via video like Youtube. Her goal is to lose weight. [name] is making gradual progress with exercise. She feels the exercise helps, and she's walking 15-20 minutes 2 days/week as tolerated. She had back surgery in the past, and she is limited by chronic back pain. She is still working on her goal of losing weight. She likes doing the arm ergometer in addition to the recumbent stepper. [name] continues to make gradual progress with exercise. She feels the arm ergometer is helping a little with upper body strength.   Expected Outcomes Progress workloads as tolerated to help increase cardiorespiratory  fitness. Continue walking at home in addition to exercise at cardiac rehab to help increase strength and stamina. Continue walking and increase workloads as tolerated. Continue to progress workloads as tolerated to help achieve personal health and fitness goals. Continue to progress workloads as tolerated.      Discharge Exercise Prescription (Final Exercise Prescription Changes):  Exercise Prescription Changes - 11/02/23 1027       Response to Exercise   Blood Pressure (Admit) 132/70    Blood Pressure (Exit) 114/68    Heart Rate (Admit) 90 bpm    Heart Rate (Exercise) 100 bpm    Heart Rate (Exit) 93 bpm    Rating of Perceived Exertion (Exercise) 12    Symptoms None    Duration Continue with 30 min of aerobic exercise without signs/symptoms of physical distress.    Intensity THRR unchanged      Progression   Progression Continue to progress workloads to maintain intensity without signs/symptoms of physical distress.    Average METs 1.5  Resistance Training   Training Prescription No    Weight Relaxation day, no weights.      Interval Training   Interval Training No      NuStep   Level 6    SPM 107    Minutes 15    METs 1.7      Arm Ergometer   Level 1    Watts 6    Minutes 15    METs 1.3      Home Exercise Plan   Plans to continue exercise at Home (comment)   Walking   Frequency Add 2 additional days to program exercise sessions.    Initial Home Exercises Provided 09/26/23          Nutrition:  Target Goals: Understanding of nutrition guidelines, daily intake of sodium 1500mg , cholesterol 200mg , calories 30% from fat and 7% or less from saturated fats, daily to have 5 or more servings of fruits and vegetables.  Biometrics:  Pre Biometrics - 08/11/23 1105       Pre Biometrics   Waist Circumference 47 inches    Hip Circumference 51.25 inches    Waist to Hip Ratio 0.92 %    Triceps Skinfold 33 mm    % Body Fat 53.3 %    Grip Strength 17 kg     Flexibility --   Not performed, hx of back surgery x 4   Single Leg Stand 5.57 seconds           Nutrition Therapy Plan and Nutrition Goals:  Nutrition Therapy & Goals - 10/21/23 1223       Nutrition Therapy   Diet Heart Healthy Diet    Drug/Food Interactions Statins/Certain Fruits      Personal Nutrition Goals   Nutrition Goal Patient to identify strategies for reducing cardiovascular risk by attending the [employer] education and nutrition series weekly.   goal in progress.   Personal Goal #2 Patient to improve diet quality by using the plate method as a guide for meal planning to include lean protein/plant protein, fruits, vegetables, whole grains, nonfat dairy as part of a well-balanced diet.   goal in progress.   Personal Goal #3 Patient to identify strategies for weight loss of 0.5-2.0# per week.   goal not met.   Comments Goals in progress. [name]  has medical history of s/p AVR, chronic diastolic heart failure, PAF, HTN, DM2. LDL has improved but is not at goal (zetia , crestor ). Her A1c has improved on increased dose of Ozempic  (2mg ) and remains in a prediabetic range; may switch to wegovy . She is motivated to lose weight; however, she is up 3.7# since starting with our program; she continues follow-up with [facility] (last visit 10/10/23) and is considering weight loss surgery. Per diet recall, her diet remains high in sugary drinks and refined carbohydrates. She continues to attend the [facility] education and nutrition series regularly. Patient will benefit from participation in intensive cardiac rehab for nutrition, exercise, and lifestyle modification.      Intervention Plan   Intervention Prescribe, educate and counsel regarding individualized specific dietary modifications aiming towards targeted core components such as weight, hypertension, lipid management, diabetes, heart failure and other comorbidities.;Nutrition handout(s) given to patient.    Expected Outcomes  Short Term Goal: Understand basic principles of dietary content, such as calories, fat, sodium, cholesterol and nutrients.;Long Term Goal: Adherence to prescribed nutrition plan.          Nutrition Assessments:  MEDIFICTS Score Key: >=70 Need to make dietary  changes  40-70 Heart Healthy Diet <= 40 Therapeutic Level Cholesterol Diet    Picture Your Plate Scores: <01 Unhealthy dietary pattern with much room for improvement. 41-50 Dietary pattern unlikely to meet recommendations for good health and room for improvement. 51-60 More healthful dietary pattern, with some room for improvement.  >60 Healthy dietary pattern, although there may be some specific behaviors that could be improved.    Nutrition Goals Re-Evaluation:  Nutrition Goals Re-Evaluation     Row Name 08/19/23 1057 09/19/23 1209 10/21/23 1223         Goals   Current Weight 200 lb 2.8 oz (90.8 kg) 200 lb 9.9 oz (91 kg) 203 lb 4.2 oz (92.2 kg)     Comment A1c 5.9, lipids WNL, direct LDL 106 no new labs; most recent labs A1c 5.9, lipids WNL, direct LDL 106 LDL 89, HDL 48, A1c 5.7     Expected Outcome [name]  has medical history of s/p AVR, chronic diastolic heart failure, PAF, HTN, DM2. LDL is not at goal (zetia , crestor ). Her A1c has improved on increased dose of Ozempic  (2mg ). She is motivated to lose weight. Patient will benefit from participation in intensive cardiac rehab for nutrition, exercise, and lifestyle modification. [name]  has medical history of s/p AVR, chronic diastolic heart failure, PAF, HTN, DM2. LDL is not at goal (zetia , crestor ). Her A1c has improved on increased dose of Ozempic  (2mg ). She is motivated to lose weight; however, she is up 1.1# since starting with our program. She continues to attend the [facility] education and nutrition series regularly. Patient will benefit from participation in intensive cardiac rehab for nutrition, exercise, and lifestyle modification. Goals in progress. [name]  has  medical history of s/p AVR, chronic diastolic heart failure, PAF, HTN, DM2. LDL has improved but is not at goal (zetia , crestor ). Her A1c has improved on increased dose of Ozempic  (2mg ) and remains in a prediabetic range; may switch to wegovy . She is motivated to lose weight; however, she is up 3.7# since starting with our program; she continues follow-up with [facility] (last visit 10/10/23) and is considering weight loss surgery. Per diet recall, her diet remains high in sugary drinks and refined carbohydrates. She continues to attend the [facility] education and nutrition series regularly. Patient will benefit from participation in intensive cardiac rehab for nutrition, exercise, and lifestyle modification.        Nutrition Goals Re-Evaluation:  Nutrition Goals Re-Evaluation     Row Name 08/19/23 1057 09/19/23 1209 10/21/23 1223         Goals   Current Weight 200 lb 2.8 oz (90.8 kg) 200 lb 9.9 oz (91 kg) 203 lb 4.2 oz (92.2 kg)     Comment A1c 5.9, lipids WNL, direct LDL 106 no new labs; most recent labs A1c 5.9, lipids WNL, direct LDL 106 LDL 89, HDL 48, A1c 5.7     Expected Outcome [name]  has medical history of s/p AVR, chronic diastolic heart failure, PAF, HTN, DM2. LDL is not at goal (zetia , crestor ). Her A1c has improved on increased dose of Ozempic  (2mg ). She is motivated to lose weight. Patient will benefit from participation in intensive cardiac rehab for nutrition, exercise, and lifestyle modification. [name]  has medical history of s/p AVR, chronic diastolic heart failure, PAF, HTN, DM2. LDL is not at goal (zetia , crestor ). Her A1c has improved on increased dose of Ozempic  (2mg ). She is motivated to lose weight; however, she is up 1.1# since starting with our program. She continues to attend the  Prit[facility]in education and nutrition series regularly. Patient will benefit from participation in intensive cardiac rehab for nutrition, exercise, and lifestyle modification. Goals in  progress. [name]  has medical history of s/p AVR, chronic diastolic heart failure, PAF, HTN, DM2. LDL has improved but is not at goal (zetia , crestor ). Her A1c has improved on increased dose of Ozempic  (2mg ) and remains in a prediabetic range; may switch to wegovy . She is motivated to lose weight; however, she is up 3.7# since starting with our program; she continues follow-up with [facility] (last visit 10/10/23) and is considering weight loss surgery. Per diet recall, her diet remains high in sugary drinks and refined carbohydrates. She continues to attend the P[facility]in education and nutrition series regularly. Patient will benefit from participation in intensive cardiac rehab for nutrition, exercise, and lifestyle modification.        Nutrition Goals Discharge (Final Nutrition Goals Re-Evaluation):  Nutrition Goals Re-Evaluation - 10/21/23 1223       Goals   Current Weight 203 lb 4.2 oz (92.2 kg)    Comment LDL 89, HDL 48, A1c 5.7    Expected Outcome Goals in progress. [name]  has medical history of s/p AVR, chronic diastolic heart failure, PAF, HTN, DM2. LDL has improved but is not at goal (zetia , crestor ). Her A1c has improved on increased dose of Ozempic  (2mg ) and remains in a prediabetic range; may switch to wegovy . She is motivated to lose weight; however, she is up 3.7# since starting with our program; she continues follow-up with [facility] (last visit 10/10/23) and is considering weight loss surgery. Per diet recall, her diet remains high in sugary drinks and refined carbohydrates. She continues to attend the P[facility]ikin education and nutrition series regularly. Patient will benefit from participation in intensive cardiac rehab for nutrition, exercise, and lifestyle modification.          Psychosocial: Target Goals: Acknowledge presence or absence of significant depression and/or stress, maximize coping skills, provide positive support system. Participant is able  to verbalize types and ability to use techniques and skills needed for reducing stress and depression.  Initial Review & Psychosocial Screening:  Initial Psych Review & Screening - 08/11/23 1432       Initial Review   Current issues with Current Stress Concerns    Comments Pt voices a low level stress in regards to her recent surgery      Family Dynamics   Good Support System? Yes   Pt has husband for support     Barriers   Psychosocial barriers to participate in program The patient should benefit from training in stress management and relaxation.      Screening Interventions   Interventions Encouraged to exercise    Expected Outcomes Short Term goal: Utilizing psychosocial counselor, staff and physician to assist with identification of specific Stressors or current issues interfering with healing process. Setting desired goal for each stressor or current issue identified.;Long Term Goal: Stressors or current issues are controlled or eliminated.;Short Term goal: Identification and review with participant of any Quality of Life or Depression concerns found by scoring the questionnaire.;Long Term goal: The participant improves quality of Life and PHQ9 Scores as seen by post scores and/or verbalization of changes          Quality of Life Scores:  Quality of Life - 08/11/23 1432       Quality of Life   Select Quality of Life      Quality of Life Scores   Health/Function Pre 25.53 %  Socioeconomic Pre 24.75 %    Psych/Spiritual Pre 25.29 %    Family Pre 27.3 %    GLOBAL Pre 25.61 %         Scores of 19 and below usually indicate a poorer quality of life in these areas.  A difference of  2-3 points is a clinically meaningful difference.  A difference of 2-3 points in the total score of the Quality of Life Index has been associated with significant improvement in overall quality of life, self-image, physical symptoms, and general health in studies assessing change in quality of  life.  PHQ-9: Review Flowsheet  More data exists      11/01/2023 08/11/2023 08/09/2023 04/26/2023 10/26/2022  Depression screen PHQ 2/9  Decreased Interest 0 0 0 0 0  Down, Depressed, Hopeless 0 0 0 0 0  PHQ - 2 Score 0 0 0 0 0  Altered sleeping 0 0 - - -  Tired, decreased energy 0 1 - - -  Change in appetite 0 0 - - -  Feeling bad or failure about yourself  0 0 - - -  Trouble concentrating 0 0 - - -  Moving slowly or fidgety/restless 0 0 - - -  Suicidal thoughts 0 0 - - -  PHQ-9 Score 0 1 - - -  Difficult doing work/chores Not difficult at all Not difficult at all - - -   Interpretation of Total Score  Total Score Depression Severity:  1-4 = Minimal depression, 5-9 = Mild depression, 10-14 = Moderate depression, 15-19 = Moderately severe depression, 20-27 = Severe depression   Psychosocial Evaluation and Intervention:   Psychosocial Re-Evaluation:  Psychosocial Re-Evaluation     Row Name 08/18/23 0813 09/09/23 0830 10/11/23 1007 11/04/23 [id number]       Psychosocial Re-Evaluation   Current issues with Current Stress Concerns Current Stress Concerns Current Stress Concerns Current Stress Concerns    Comments [name] started cardiac rehab on 08/17/23. [name] did not not voice any increased concerns or stressors. [name] admits to having low energy post open heart surgery. [name] hopes that that her energy level will improve participaitng in cardiac rehab. [name] has not voiced any increased concerns or stressors during exercise at cardiac rehab. [name] continues  not to  voice any increased concerns or stressors during exercise at cardiac rehab. [name] continues  not to  voice any increased concerns or stressors during exercise at cardiac rehab.    Expected Outcomes [name] will have decreaseed or contolled stress upon completion of cardiac rehab [name] will have decreaseed or contolled stress upon completion of cardiac rehab [name] will have decreaseed or contolled stress  upon completion of cardiac rehab [name] will have decreaseed or contolled stress upon completion of cardiac rehab    Interventions Stress management education;Encouraged to attend Cardiac Rehabilitation for the exercise;Relaxation education Stress management education;Encouraged to attend Cardiac Rehabilitation for the exercise;Relaxation education Stress management education;Encouraged to attend Cardiac Rehabilitation for the exercise;Relaxation education Stress management education;Encouraged to attend Cardiac Rehabilitation for the exercise;Relaxation education    Continue Psychosocial Services  Follow up required by staff No Follow up required No Follow up required No Follow up required      Initial Review   Source of Stress Concerns Unable to participate in former interests or hobbies;Unable to perform yard/household activities;Chronic Illness Unable to participate in former interests or hobbies;Unable to perform yard/household activities;Chronic Illness Unable to participate in former interests or hobbies;Unable to perform yard/household activities;Chronic Illness Unable to participate in former interests  or hobbies;Unable to perform yard/household activities;Chronic Illness    Comments Will continue to monitor and offer support as needed Will continue to monitor and offer support as needed Will continue to monitor and offer support as needed Will continue to monitor and offer support as needed       Psychosocial Discharge (Final Psychosocial Re-Evaluation):  Psychosocial Re-Evaluation - 11/04/23 0937       Psychosocial Re-Evaluation   Current issues with Current Stress Concerns    Comments [name] continues  not to  voice any increased concerns or stressors during exercise at cardiac rehab.    Expected Outcomes [name] will have decreaseed or contolled stress upon completion of cardiac rehab    Interventions Stress management education;Encouraged to attend Cardiac Rehabilitation for the  exercise;Relaxation education    Continue Psychosocial Services  No Follow up required      Initial Review   Source of Stress Concerns Unable to participate in former interests or hobbies;Unable to perform yard/household activities;Chronic Illness    Comments Will continue to monitor and offer support as needed          Vocational Rehabilitation: Provide vocational rehab assistance to qualifying candidates.   Vocational Rehab Evaluation & Intervention:  Vocational Rehab - 08/11/23 1433       Initial Vocational Rehab Evaluation & Intervention   Assessment shows need for Vocational Rehabilitation No   pt is retired         Education: Education Goals: Education classes will be provided on a weekly basis, covering required topics. Participant will state understanding/return demonstration of topics presented.    Education     Row Name 08/17/23 1300     Education   Cardiac Education Topics Pritikin   Orthoptist   Educator Dietitian   Weekly Topic Comforting Weekend Breakfasts   Instruction Review Code 1- Verbalizes Understanding   Class Start Time 1145   Class Stop Time 1228   Class Time Calculation (min) 43 min    Row Name 08/22/23 1100     Education   Cardiac Education Topics Pritikin   Select Core Videos     Core Videos   Educator Exercise Physiologist   Select Exercise Education   Exercise Education Biomechanial Limitations   Instruction Review Code 1- Verbalizes Understanding   Class Start Time 1145   Class Stop Time 1218   Class Time Calculation (min) 33 min    Row Name 08/24/23 1100     Education   Cardiac Education Topics Pritikin   Customer service manager   Weekly Topic Fast Evening Meals   Instruction Review Code 1- Verbalizes Understanding   Class Start Time 1145   Class Stop Time 1225   Class Time Calculation (min) 40 min    Row Name 08/31/23 1500     Education    Cardiac Education Topics Pritikin   Customer service manager   Weekly Topic International Cuisine- Spotlight on the [employer] Zones   Instruction Review Code 1- Verbalizes Understanding   Class Start Time 1145   Class Stop Time 1225   Class Time Calculation (min) 40 min    Row Name 09/05/23 1000     Education   Cardiac Education Topics Pritikin   Geographical information systems officer Psychosocial   Psychosocial Workshop Healthy  Sleep for a Healthy Heart   Instruction Review Code 1- Verbalizes Understanding   Class Start Time 1148   Class Stop Time 1234   Class Time Calculation (min) 46 min    Row Name 09/12/23 1300     Education   Cardiac Education Topics Pritikin   Select Workshops     Workshops   Educator Exercise Physiologist   Select Exercise   Exercise Workshop Managing Heart Disease: Your Path to a Healthier Heart   Instruction Review Code 1- Verbalizes Understanding   Class Start Time 1145   Class Stop Time 1237   Class Time Calculation (min) 52 min    Row Name 09/14/23 1300     Education   Cardiac Education Topics Pritikin   Customer service manager   Weekly Topic Powerhouse Plant-Based Proteins   Instruction Review Code 1- Verbalizes Understanding   Class Start Time 1145   Class Stop Time 1222   Class Time Calculation (min) 37 min    Row Name 09/19/23 1100     Education   Cardiac Education Topics Pritikin   Geographical information systems officer Psychosocial   Psychosocial Workshop From Head to Heart: The Power of a Healthy Outlook   Instruction Review Code 1- Verbalizes Understanding   Class Start Time 1147   Class Stop Time 1234   Class Time Calculation (min) 47 min    Row Name 09/26/23 1100     Education   Cardiac Education Topics Pritikin   Nurse, children's  Exercise Physiologist   Select Psychosocial   Psychosocial Healthy Minds, Bodies, Hearts   Instruction Review Code 1- Verbalizes Understanding   Class Start Time 1147   Class Stop Time 1221   Class Time Calculation (min) 34 min    Row Name 09/28/23 1400     Education   Cardiac Education Topics Pritikin   Customer service manager   Weekly Topic Adding Flavor - Sodium-Free   Instruction Review Code 1- Verbalizes Understanding   Class Start Time 1145   Class Stop Time 1221   Class Time Calculation (min) 36 min    Row Name 10/03/23 1300     Education   Cardiac Education Topics Pritikin   Glass blower/designer Nutrition   Nutrition Workshop Label Reading   Instruction Review Code 1- Verbalizes Understanding   Class Start Time 1145   Class Stop Time 1228   Class Time Calculation (min) 43 min    Row Name 10/10/23 1100     Education   Cardiac Education Topics Pritikin   Hospital doctor Education   Instruction Review Code 1- Verbalizes Understanding   Class Start Time 1150   Class Stop Time 1230   Class Time Calculation (min) 40 min    Row Name 10/12/23 1400     Education   Cardiac Education Topics Pritikin   Orthoptist   Educator Dietitian   Weekly Topic Personalizing Your Pritikin Plate   Instruction Review Code 1- Verbalizes Understanding   Class Start Time 1145   Class Stop Time 1220   Class Time  Calculation (min) 35 min    Row Name 10/19/23 1500     Education   Cardiac Education Topics Pritikin   Orthoptist   Educator Dietitian   Weekly Topic [employer] Desserts   Instruction Review Code 1- Verbalizes Understanding   Class Start Time 1145   Class Stop Time 1225   Class Time Calculation (min) 40 min    Row Name 10/26/23 1500     Education    Cardiac Education Topics Pritikin   Customer service manager   Weekly Topic Efficiency Cooking - Meals in a Snap   Instruction Review Code 1- Verbalizes Understanding   Class Start Time 1145   Class Stop Time 1222   Class Time Calculation (min) 37 min    Row Name 11/02/23 1300     Education   Cardiac Education Topics Pritikin   Customer service manager   Weekly Topic One-Pot Wonders   Instruction Review Code 1- Verbalizes Understanding   Class Start Time 1145   Class Stop Time 1220   Class Time Calculation (min) 35 min    Row Name 11/07/23 1300     Education   Cardiac Education Topics Pritikin   Geographical information systems officer Psychosocial   Psychosocial Workshop Focused Goals, Sustainable Changes   Instruction Review Code 1- Verbalizes Understanding   Class Start Time 1155   Class Stop Time 1235   Class Time Calculation (min) 40 min      Core Videos: Exercise    Move It!  Clinical staff conducted group or individual video education with verbal and written material and guidebook.  Patient learns the recommended Pritikin exercise program. Exercise with the goal of living a long, healthy life. Some of the health benefits of exercise include controlled diabetes, healthier blood pressure levels, improved cholesterol levels, improved heart and lung capacity, improved sleep, and better body composition. Everyone should speak with their doctor before starting or changing an exercise routine.  Biomechanical Limitations Clinical staff conducted group or individual video education with verbal and written material and guidebook.  Patient learns how biomechanical limitations can impact exercise and how we can mitigate and possibly overcome limitations to have an impactful and balanced exercise routine.  Body Composition Clinical staff conducted group or  individual video education with verbal and written material and guidebook.  Patient learns that body composition (ratio of muscle mass to fat mass) is a key component to assessing overall fitness, rather than body weight alone. Increased fat mass, especially visceral belly fat, can put us  at increased risk for metabolic syndrome, type 2 diabetes, heart disease, and even death. It is recommended to combine diet and exercise (cardiovascular and resistance training) to improve your body composition. Seek guidance from your physician and exercise physiologist before implementing an exercise routine.  Exercise Action Plan Clinical staff conducted group or individual video education with verbal and written material and guidebook.  Patient learns the recommended strategies to achieve and enjoy long-term exercise adherence, including variety, self-motivation, self-efficacy, and positive decision making. Benefits of exercise include fitness, good health, weight management, more energy, better sleep, less stress, and overall well-being.  Medical   Heart Disease Risk Reduction Clinical staff conducted group or individual video education with verbal and written material and guidebook.  Patient learns our heart  is our most vital organ as it circulates oxygen, nutrients, white blood cells, and hormones throughout the entire body, and carries waste away. Data supports a plant-based eating plan like the Pritikin Program for its effectiveness in slowing progression of and reversing heart disease. The video provides a number of recommendations to address heart disease.   Metabolic Syndrome and Belly Fat  Clinical staff conducted group or individual video education with verbal and written material and guidebook.  Patient learns what metabolic syndrome is, how it leads to heart disease, and how one can reverse it and keep it from coming back. You have metabolic syndrome if you have 3 of the following 5 criteria: abdominal  obesity, high blood pressure, high triglycerides, low HDL cholesterol, and high blood sugar.  Hypertension and Heart Disease Clinical staff conducted group or individual video education with verbal and written material and guidebook.  Patient learns that high blood pressure, or hypertension, is very common in the [place] . Hypertension is largely due to excessive salt intake, but other important risk factors include being overweight, physical inactivity, drinking too much alcohol, smoking, and not eating enough potassium from fruits and vegetables. High blood pressure is a leading risk factor for heart attack, stroke, congestive heart failure, dementia, kidney failure, and premature death. Long-term effects of excessive salt intake include stiffening of the arteries and thickening of heart muscle and organ damage. Recommendations include ways to reduce hypertension and the risk of heart disease.  Diseases of Our Time - Focusing on Diabetes Clinical staff conducted group or individual video education with verbal and written material and guidebook.  Patient learns why the best way to stop diseases of our time is prevention, through food and other lifestyle changes. Medicine (such as prescription pills and surgeries) is often only a Band-Aid on the problem, not a long-term solution. Most common diseases of our time include obesity, type 2 diabetes, hypertension, heart disease, and cancer. The Pritikin Program is recommended and has been proven to help reduce, reverse, and/or prevent the damaging effects of metabolic syndrome.  Nutrition   Overview of the Pritikin Eating Plan  Clinical staff conducted group or individual video education with verbal and written material and guidebook.  Patient learns about the Pritikin Eating Plan for disease risk reduction. The Pritikin Eating Plan emphasizes a wide variety of unrefined, minimally-processed carbohydrates, like fruits, vegetables, whole grains, and  legumes. Go, Caution, and Stop food choices are explained. Plant-based and lean animal proteins are emphasized. Rationale provided for low sodium intake for blood pressure control, low added sugars for blood sugar stabilization, and low added fats and oils for coronary artery disease risk reduction and weight management.  Calorie Density  Clinical staff conducted group or individual video education with verbal and written material and guidebook.  Patient learns about calorie density and how it impacts the Pritikin Eating Plan. Knowing the characteristics of the food you choose will help you decide whether those foods will lead to weight gain or weight loss, and whether you want to consume more or less of them. Weight loss is usually a side effect of the Pritikin Eating Plan because of its focus on low calorie-dense foods.  Label Reading  Clinical staff conducted group or individual video education with verbal and written material and guidebook.  Patient learns about the Pritikin recommended label reading guidelines and corresponding recommendations regarding calorie density, added sugars, sodium content, and whole grains.  Dining Out - Part 1  Clinical staff conducted group or individual video  education with verbal and written material and guidebook.  Patient learns that restaurant meals can be sabotaging because they can be so high in calories, fat, sodium, and/or sugar. Patient learns recommended strategies on how to positively address this and avoid unhealthy pitfalls.  Facts on Fats  Clinical staff conducted group or individual video education with verbal and written material and guidebook.  Patient learns that lifestyle modifications can be just as effective, if not more so, as many medications for lowering your risk of heart disease. A Pritikin lifestyle can help to reduce your risk of inflammation and atherosclerosis (cholesterol build-up, or plaque, in the artery walls). Lifestyle  interventions such as dietary choices and physical activity address the cause of atherosclerosis. A review of the types of fats and their impact on blood cholesterol levels, along with dietary recommendations to reduce fat intake is also included.  Nutrition Action Plan  Clinical staff conducted group or individual video education with verbal and written material and guidebook.  Patient learns how to incorporate Pritikin recommendations into their lifestyle. Recommendations include planning and keeping personal health goals in mind as an important part of their success.  Healthy Mind-Set    Healthy Minds, Bodies, Hearts  Clinical staff conducted group or individual video education with verbal and written material and guidebook.  Patient learns how to identify when they are stressed. Video will discuss the impact of that stress, as well as the many benefits of stress management. Patient will also be introduced to stress management techniques. The way we think, act, and feel has an impact on our hearts.  How Our Thoughts Can Heal Our Hearts  Clinical staff conducted group or individual video education with verbal and written material and guidebook.  Patient learns that negative thoughts can cause depression and anxiety. This can result in negative lifestyle behavior and serious health problems. Cognitive behavioral therapy is an effective method to help control our thoughts in order to change and improve our emotional outlook.  Additional Videos:  Exercise    Improving Performance  Clinical staff conducted group or individual video education with verbal and written material and guidebook.  Patient learns to use a non-linear approach by alternating intensity levels and lengths of time spent exercising to help burn more calories and lose more body fat. Cardiovascular exercise helps improve heart health, metabolism, hormonal balance, blood sugar control, and recovery from fatigue. Resistance training  improves strength, endurance, balance, coordination, reaction time, metabolism, and muscle mass. Flexibility exercise improves circulation, posture, and balance. Seek guidance from your physician and exercise physiologist before implementing an exercise routine and learn your capabilities and proper form for all exercise.  Introduction to Yoga  Clinical staff conducted group or individual video education with verbal and written material and guidebook.  Patient learns about yoga, a discipline of the coming together of mind, breath, and body. The benefits of yoga include improved flexibility, improved range of motion, better posture and core strength, increased lung function, weight loss, and positive self-image. Yoga's heart health benefits include lowered blood pressure, healthier heart rate, decreased cholesterol and triglyceride levels, improved immune function, and reduced stress. Seek guidance from your physician and exercise physiologist before implementing an exercise routine and learn your capabilities and proper form for all exercise.  Medical   Aging: Enhancing Your Quality of Life  Clinical staff conducted group or individual video education with verbal and written material and guidebook.  Patient learns key strategies and recommendations to stay in good physical health and enhance quality  of life, such as prevention strategies, having an advocate, securing a Health Care Proxy and Power of Attorney, and keeping a list of medications and system for tracking them. It also discusses how to avoid risk for bone loss.  Biology of Weight Control  Clinical staff conducted group or individual video education with verbal and written material and guidebook.  Patient learns that weight gain occurs because we consume more calories than we burn (eating more, moving less). Even if your body weight is normal, you may have higher ratios of fat compared to muscle mass. Too much body fat puts you at increased  risk for cardiovascular disease, heart attack, stroke, type 2 diabetes, and obesity-related cancers. In addition to exercise, following the Pritikin Eating Plan can help reduce your risk.  Decoding Lab Results  Clinical staff conducted group or individual video education with verbal and written material and guidebook.  Patient learns that lab test reflects one measurement whose values change over time and are influenced by many factors, including medication, stress, sleep, exercise, food, hydration, pre-existing medical conditions, and more. It is recommended to use the knowledge from this video to become more involved with your lab results and evaluate your numbers to speak with your doctor.   Diseases of Our Time - Overview  Clinical staff conducted group or individual video education with verbal and written material and guidebook.  Patient learns that according to the CDC, 50% to 70% of chronic diseases (such as obesity, type 2 diabetes, elevated lipids, hypertension, and heart disease) are avoidable through lifestyle improvements including healthier food choices, listening to satiety cues, and increased physical activity.  Sleep Disorders Clinical staff conducted group or individual video education with verbal and written material and guidebook.  Patient learns how good quality and duration of sleep are important to overall health and well-being. Patient also learns about sleep disorders and how they impact health along with recommendations to address them, including discussing with a physician.  Nutrition  Dining Out - Part 2 Clinical staff conducted group or individual video education with verbal and written material and guidebook.  Patient learns how to plan ahead and communicate in order to maximize their dining experience in a healthy and nutritious manner. Included are recommended food choices based on the type of restaurant the patient is visiting.   Fueling a Tax inspector conducted group or individual video education with verbal and written material and guidebook.  There is a strong connection between our food choices and our health. Diseases like obesity and type 2 diabetes are very prevalent and are in large-part due to lifestyle choices. The Pritikin Eating Plan provides plenty of food and hunger-curbing satisfaction. It is easy to follow, affordable, and helps reduce health risks.  Menu Workshop  Clinical staff conducted group or individual video education with verbal and written material and guidebook.  Patient learns that restaurant meals can sabotage health goals because they are often packed with calories, fat, sodium, and sugar. Recommendations include strategies to plan ahead and to communicate with the manager, chef, or server to help order a healthier meal.  Planning Your Eating Strategy  Clinical staff conducted group or individual video education with verbal and written material and guidebook.  Patient learns about the Pritikin Eating Plan and its benefit of reducing the risk of disease. The Pritikin Eating Plan does not focus on calories. Instead, it emphasizes high-quality, nutrient-rich foods. By knowing the characteristics of the foods, we choose, we can determine their calorie  density and make informed decisions.  Targeting Your Nutrition Priorities  Clinical staff conducted group or individual video education with verbal and written material and guidebook.  Patient learns that lifestyle habits have a tremendous impact on disease risk and progression. This video provides eating and physical activity recommendations based on your personal health goals, such as reducing LDL cholesterol, losing weight, preventing or controlling type 2 diabetes, and reducing high blood pressure.  Vitamins and Minerals  Clinical staff conducted group or individual video education with verbal and written material and guidebook.  Patient learns different ways to  obtain key vitamins and minerals, including through a recommended healthy diet. It is important to discuss all supplements you take with your doctor.   Healthy Mind-Set    Smoking Cessation  Clinical staff conducted group or individual video education with verbal and written material and guidebook.  Patient learns that cigarette smoking and tobacco addiction pose a serious health risk which affects millions of people. Stopping smoking will significantly reduce the risk of heart disease, lung disease, and many forms of cancer. Recommended strategies for quitting are covered, including working with your doctor to develop a successful plan.  Culinary   Becoming a Set designer conducted group or individual video education with verbal and written material and guidebook.  Patient learns that cooking at home can be healthy, cost-effective, quick, and puts them in control. Keys to cooking healthy recipes will include looking at your recipe, assessing your equipment needs, planning ahead, making it simple, choosing cost-effective seasonal ingredients, and limiting the use of added fats, salts, and sugars.  Cooking - Breakfast and Snacks  Clinical staff conducted group or individual video education with verbal and written material and guidebook.  Patient learns how important breakfast is to satiety and nutrition through the entire day. Recommendations include key foods to eat during breakfast to help stabilize blood sugar levels and to prevent overeating at meals later in the day. Planning ahead is also a key component.  Cooking - Educational psychologist conducted group or individual video education with verbal and written material and guidebook.  Patient learns eating strategies to improve overall health, including an approach to cook more at home. Recommendations include thinking of animal protein as a side on your plate rather than center stage and focusing instead on lower  calorie dense options like vegetables, fruits, whole grains, and plant-based proteins, such as beans. Making sauces in large quantities to freeze for later and leaving the skin on your vegetables are also recommended to maximize your experience.  Cooking - Healthy Salads and Dressing Clinical staff conducted group or individual video education with verbal and written material and guidebook.  Patient learns that vegetables, fruits, whole grains, and legumes are the foundations of the Pritikin Eating Plan. Recommendations include how to incorporate each of these in flavorful and healthy salads, and how to create homemade salad dressings. Proper handling of ingredients is also covered. Cooking - Soups and [employer] - Soups and Desserts Clinical staff conducted group or individual video education with verbal and written material and guidebook.  Patient learns that Pritikin soups and desserts make for easy, nutritious, and delicious snacks and meal components that are low in sodium, fat, sugar, and calorie density, while high in vitamins, minerals, and filling fiber. Recommendations include simple and healthy ideas for soups and desserts.   Overview     The Pritikin Solution Program Overview Clinical staff conducted group or individual video education  with verbal and written material and guidebook.  Patient learns that the results of the Pritikin Program have been documented in more than 100 articles published in peer-reviewed journals, and the benefits include reducing risk factors for (and, in some cases, even reversing) high cholesterol, high blood pressure, type 2 diabetes, obesity, and more! An overview of the three key pillars of the Pritikin Program will be covered: eating well, doing regular exercise, and having a healthy mind-set.  WORKSHOPS  Exercise: Exercise Basics: Building Your Action Plan Clinical staff led group instruction and group discussion with PowerPoint presentation and  patient guidebook. To enhance the learning environment the use of posters, models and videos may be added. At the conclusion of this workshop, patients will comprehend the difference between physical activity and exercise, as well as the benefits of incorporating both, into their routine. Patients will understand the FITT (Frequency, Intensity, Time, and Type) principle and how to use it to build an exercise action plan. In addition, safety concerns and other considerations for exercise and cardiac rehab will be addressed by the presenter. The purpose of this lesson is to promote a comprehensive and effective weekly exercise routine in order to improve patients' overall level of fitness.   Managing Heart Disease: Your Path to a Healthier Heart Clinical staff led group instruction and group discussion with PowerPoint presentation and patient guidebook. To enhance the learning environment the use of posters, models and videos may be added.At the conclusion of this workshop, patients will understand the anatomy and physiology of the heart. Additionally, they will understand how Pritikin's three pillars impact the risk factors, the progression, and the management of heart disease.  The purpose of this lesson is to provide a high-level overview of the heart, heart disease, and how the Pritikin lifestyle positively impacts risk factors.  Exercise Biomechanics Clinical staff led group instruction and group discussion with PowerPoint presentation and patient guidebook. To enhance the learning environment the use of posters, models and videos may be added. Patients will learn how the structural parts of their bodies function and how these functions impact their daily activities, movement, and exercise. Patients will learn how to promote a neutral spine, learn how to manage pain, and identify ways to improve their physical movement in order to promote healthy living. The purpose of this lesson is to expose  patients to common physical limitations that impact physical activity. Participants will learn practical ways to adapt and manage aches and pains, and to minimize their effect on regular exercise. Patients will learn how to maintain good posture while sitting, walking, and lifting.  Balance Training and Fall Prevention  Clinical staff led group instruction and group discussion with PowerPoint presentation and patient guidebook. To enhance the learning environment the use of posters, models and videos may be added. At the conclusion of this workshop, patients will understand the importance of their sensorimotor skills (vision, proprioception, and the vestibular system) in maintaining their ability to balance as they age. Patients will apply a variety of balancing exercises that are appropriate for their current level of function. Patients will understand the common causes for poor balance, possible solutions to these problems, and ways to modify their physical environment in order to minimize their fall risk. The purpose of this lesson is to teach patients about the importance of maintaining balance as they age and ways to minimize their risk of falling.  WORKSHOPS   Nutrition:  Fueling a Ship broker led group instruction and group discussion with PowerPoint  presentation and patient guidebook. To enhance the learning environment the use of posters, models and videos may be added. Patients will review the foundational principles of the Pritikin Eating Plan and understand what constitutes a serving size in each of the food groups. Patients will also learn Pritikin-friendly foods that are better choices when away from home and review make-ahead meal and snack options. Calorie density will be reviewed and applied to three nutrition priorities: weight maintenance, weight loss, and weight gain. The purpose of this lesson is to reinforce (in a group setting) the key concepts around what  patients are recommended to eat and how to apply these guidelines when away from home by planning and selecting Pritikin-friendly options. Patients will understand how calorie density may be adjusted for different weight management goals.  Mindful Eating  Clinical staff led group instruction and group discussion with PowerPoint presentation and patient guidebook. To enhance the learning environment the use of posters, models and videos may be added. Patients will briefly review the concepts of the Pritikin Eating Plan and the importance of low-calorie dense foods. The concept of mindful eating will be introduced as well as the importance of paying attention to internal hunger signals. Triggers for non-hunger eating and techniques for dealing with triggers will be explored. The purpose of this lesson is to provide patients with the opportunity to review the basic principles of the Pritikin Eating Plan, discuss the value of eating mindfully and how to measure internal cues of hunger and fullness using the Hunger Scale. Patients will also discuss reasons for non-hunger eating and learn strategies to use for controlling emotional eating.  Targeting Your Nutrition Priorities Clinical staff led group instruction and group discussion with PowerPoint presentation and patient guidebook. To enhance the learning environment the use of posters, models and videos may be added. Patients will learn how to determine their genetic susceptibility to disease by reviewing their family history. Patients will gain insight into the importance of diet as part of an overall healthy lifestyle in mitigating the impact of genetics and other environmental insults. The purpose of this lesson is to provide patients with the opportunity to assess their personal nutrition priorities by looking at their family history, their own health history and current risk factors. Patients will also be able to discuss ways of prioritizing and modifying  the Pritikin Eating Plan for their highest risk areas  Menu  Clinical staff led group instruction and group discussion with PowerPoint presentation and patient guidebook. To enhance the learning environment the use of posters, models and videos may be added. Using menus brought in from [employer], or printed from [employer], patients will apply the Pritikin dining out guidelines that were presented in the [employer] video. Patients will also be able to practice these guidelines in a variety of provided scenarios. The purpose of this lesson is to provide patients with the opportunity to practice hands-on learning of the Pritikin Dining Out guidelines with actual menus and practice scenarios.  Label Reading Clinical staff led group instruction and group discussion with PowerPoint presentation and patient guidebook. To enhance the learning environment the use of posters, models and videos may be added. Patients will review and discuss the Pritikin label reading guidelines presented in Pritikin's Label Reading Educational series video. Using fool labels brought in from local grocery stores and markets, patients will apply the label reading guidelines and determine if the packaged food meet the Pritikin guidelines. The purpose of this lesson is to provide patients  with the opportunity to review, discuss, and practice hands-on learning of the Pritikin Label Reading guidelines with actual packaged food labels. Cooking School  Pritikin's [employer] are designed to teach patients ways to prepare quick, simple, and affordable recipes at home. The importance of nutrition's role in chronic disease risk reduction is reflected in its emphasis in the overall Pritikin program. By learning how to prepare essential core Pritikin Eating Plan recipes, patients will increase control over what they eat; be able to customize the flavor of foods without the use of added salt, sugar, or  fat; and improve the quality of the food they consume. By learning a set of core recipes which are easily assembled, quickly prepared, and affordable, patients are more likely to prepare more healthy foods at home. These workshops focus on convenient breakfasts, simple entres, side dishes, and desserts which can be prepared with minimal effort and are consistent with nutrition recommendations for cardiovascular risk reduction. Cooking [employer] are taught by a Armed forces logistics/support/administrative officer (RD) who has been trained by the [employer]. The chef or RD has a clear understanding of the importance of minimizing - if not completely eliminating - added fat, sugar, and sodium in recipes. Throughout the series of Cooking School Workshop sessions, patients will learn about healthy ingredients and efficient methods of cooking to build confidence in their capability to prepare    Cooking School weekly topics:  Adding Flavor- Sodium-Free  Fast and Healthy Breakfasts  Powerhouse Plant-Based Proteins  Satisfying Salads and Dressings  Simple Sides and Sauces  International Cuisine-Spotlight on the [employer] Zones  Delicious Desserts  Savory Soups  [employer] - Meals in a Astronomer Appetizers and Snacks  Comforting Weekend Breakfasts  One-Pot Wonders   Fast Evening Meals  Landscape architect Your Pritikin Plate  WORKSHOPS   Healthy Mindset (Psychosocial):  Focused Goals, Sustainable Changes Clinical staff led group instruction and group discussion with PowerPoint presentation and patient guidebook. To enhance the learning environment the use of posters, models and videos may be added. Patients will be able to apply effective goal setting strategies to establish at least one personal goal, and then take consistent, meaningful action toward that goal. They will learn to identify common barriers to achieving personal goals and develop strategies to overcome them. Patients  will also gain an understanding of how our mind-set can impact our ability to achieve goals and the importance of cultivating a positive and growth-oriented mind-set. The purpose of this lesson is to provide patients with a deeper understanding of how to set and achieve personal goals, as well as the tools and strategies needed to overcome common obstacles which may arise along the way.  From Head to Heart: The Power of a Healthy Outlook  Clinical staff led group instruction and group discussion with PowerPoint presentation and patient guidebook. To enhance the learning environment the use of posters, models and videos may be added. Patients will be able to recognize and describe the impact of emotions and mood on physical health. They will discover the importance of self-care and explore self-care practices which may work for them. Patients will also learn how to utilize the 4 C's to cultivate a healthier outlook and better manage stress and challenges. The purpose of this lesson is to demonstrate to patients how a healthy outlook is an essential part of maintaining good health, especially as they continue their cardiac rehab journey.  Healthy Sleep for a Healthy Heart Clinical staff  led group instruction and group discussion with PowerPoint presentation and patient guidebook. To enhance the learning environment the use of posters, models and videos may be added. At the conclusion of this workshop, patients will be able to demonstrate knowledge of the importance of sleep to overall health, well-being, and quality of life. They will understand the symptoms of, and treatments for, common sleep disorders. Patients will also be able to identify daytime and nighttime behaviors which impact sleep, and they will be able to apply these tools to help manage sleep-related challenges. The purpose of this lesson is to provide patients with a general overview of sleep and outline the importance of quality sleep. Patients  will learn about a few of the most common sleep disorders. Patients will also be introduced to the concept of "sleep hygiene," and discover ways to self-manage certain sleeping problems through simple daily behavior changes. Finally, the workshop will motivate patients by clarifying the links between quality sleep and their goals of heart-healthy living.   Recognizing and Reducing Stress Clinical staff led group instruction and group discussion with PowerPoint presentation and patient guidebook. To enhance the learning environment the use of posters, models and videos may be added. At the conclusion of this workshop, patients will be able to understand the types of stress reactions, differentiate between acute and chronic stress, and recognize the impact that chronic stress has on their health. They will also be able to apply different coping mechanisms, such as reframing negative self-talk. Patients will have the opportunity to practice a variety of stress management techniques, such as deep abdominal breathing, progressive muscle relaxation, and/or guided imagery.  The purpose of this lesson is to educate patients on the role of stress in their lives and to provide healthy techniques for coping with it.  Learning Barriers/Preferences:  Learning Barriers/Preferences - 08/11/23 1433       Learning Barriers/Preferences   Learning Barriers Sight   reading glasses   Learning Preferences Audio;Computer/Internet;Group Instruction;Individual Instruction;Pictoral;Skilled Demonstration;Verbal Instruction;Video;Written Material          Education Topics:  Knowledge Questionnaire Score:  Knowledge Questionnaire Score - 08/11/23 1433       Knowledge Questionnaire Score   Pre Score 21/24          Core Components/Risk Factors/Patient Goals at Admission:  Personal Goals and Risk Factors at Admission - 08/11/23 1433       Core Components/Risk Factors/Patient Goals on Admission    Weight  Management Yes;Obesity;Weight Loss    Intervention Weight Management: Develop a combined nutrition and exercise program designed to reach desired caloric intake, while maintaining appropriate intake of nutrient and fiber, sodium and fats, and appropriate energy expenditure required for the weight goal.;Weight Management: Provide education and appropriate resources to help participant work on and attain dietary goals.;Weight Management/Obesity: Establish reasonable short term and long term weight goals.;Obesity: Provide education and appropriate resources to help participant work on and attain dietary goals.    Expected Outcomes Short Term: Continue to assess and modify interventions until short term weight is achieved;Long Term: Adherence to nutrition and physical activity/exercise program aimed toward attainment of established weight goal;Weight Loss: Understanding of general recommendations for a balanced deficit meal plan, which promotes 1-2 lb weight loss per week and includes a negative energy balance of [phone] kcal/d;Understanding recommendations for meals to include 15-35% energy as protein, 25-35% energy from fat, 35-60% energy from carbohydrates, less than 200mg  of dietary cholesterol, 20-35 gm of total fiber daily;Understanding of distribution of calorie intake throughout the  day with the consumption of 4-5 meals/snacks    Diabetes Yes    Intervention Provide education about signs/symptoms and action to take for hypo/hyperglycemia.;Provide education about proper nutrition, including hydration, and aerobic/resistive exercise prescription along with prescribed medications to achieve blood glucose in normal ranges: Fasting glucose 65-99 mg/dL    Expected Outcomes Short Term: Participant verbalizes understanding of the signs/symptoms and immediate care of hyper/hypoglycemia, proper foot care and importance of medication, aerobic/resistive exercise and nutrition plan for blood glucose control.;Long Term:  Attainment of HbA1C < 7%.    Hypertension Yes    Intervention Provide education on lifestyle modifcations including regular physical activity/exercise, weight management, moderate sodium restriction and increased consumption of fresh fruit, vegetables, and low fat dairy, alcohol moderation, and smoking cessation.;Monitor prescription use compliance.    Expected Outcomes Short Term: Continued assessment and intervention until BP is < 140/66mm HG in hypertensive participants. < 130/29mm HG in hypertensive participants with diabetes, heart failure or chronic kidney disease.;Long Term: Maintenance of blood pressure at goal levels.    Lipids Yes    Intervention Provide education and support for participant on nutrition & aerobic/resistive exercise along with prescribed medications to achieve LDL 70mg , HDL >40mg .    Expected Outcomes Short Term: Participant states understanding of desired cholesterol values and is compliant with medications prescribed. Participant is following exercise prescription and nutrition guidelines.;Long Term: Cholesterol controlled with medications as prescribed, with individualized exercise RX and with personalized nutrition plan. Value goals: LDL < 70mg , HDL > 40 mg.    Stress Yes    Intervention Offer individual and/or small group education and counseling on adjustment to heart disease, stress management and health-related lifestyle change. Teach and support self-help strategies.;Refer participants experiencing significant psychosocial distress to appropriate mental health specialists for further evaluation and treatment. When possible, include family members and significant others in education/counseling sessions.    Expected Outcomes Short Term: Participant demonstrates changes in health-related behavior, relaxation and other stress management skills, ability to obtain effective social support, and compliance with psychotropic medications if prescribed.;Long Term: Emotional  wellbeing is indicated by absence of clinically significant psychosocial distress or social isolation.          Core Components/Risk Factors/Patient Goals Review:   Goals and Risk Factor Review     Row Name 08/18/23 1610 09/09/23 9604 10/11/23 1009 11/04/23 0938       Core Components/Risk Factors/Patient Goals Review   Personal Goals Review Weight Management/Obesity;Hypertension;Lipids;Diabetes Weight Management/Obesity;Hypertension;Lipids;Diabetes Weight Management/Obesity;Hypertension;Lipids;Diabetes Weight Management/Obesity;Hypertension;Lipids;Diabetes    Review [name] started cardiac rehab on 08/17/23. [name] did well with exercise for her fitness level. [name] is somewhat deconditioned and uses a rollator for stability. Moderate exertional systolic Bp's noted. Will continue to monitor BP [name] started cardiac rehab on 08/17/23. [name] did well with exercise for her fitness level. [name] is somewhat deconditioned and uses a rollator for stability. Systolic BP's noted to be in the 150's at cardiac rehab resting. Vital signs have been forwarded to Dr Stann Earnest for review. [name] continues to do  fair with  exercise for her fitness level. Michelle's vital signs and CBG's have been stable. [name] has gained 1 kg since starting cardiac rehab [name] continues to do  fair with  exercise for her fitness level. Michelle's vital signs and CBG's remain stable. [name] has gained 1.4  kg since starting cardiac rehab    Expected Outcomes [name] will continue to participate in cardiac rehab for exercise, nutrition and lifestyle modifications [name] will continue to participate in cardiac rehab for exercise,  nutrition and lifestyle modifications [name] will continue to participate in cardiac rehab for exercise, nutrition and lifestyle modifications [name] will continue to participate in cardiac rehab for exercise, nutrition and lifestyle modifications       Core Components/Risk  Factors/Patient Goals at Discharge (Final Review):   Goals and Risk Factor Review - 11/04/23 0938       Core Components/Risk Factors/Patient Goals Review   Personal Goals Review Weight Management/Obesity;Hypertension;Lipids;Diabetes    Review [name] continues to do  fair with  exercise for her fitness level. Michelle's vital signs and CBG's remain stable. [name] has gained 1.4  kg since starting cardiac rehab    Expected Outcomes [name] will continue to participate in cardiac rehab for exercise, nutrition and lifestyle modifications          ITP Comments:  ITP Comments     Row Name 08/11/23 1021 08/18/23 0812 08/23/23 0759 09/09/23 0824 10/11/23 1006   ITP Comments Tamala Fair, MD:  Medical Director.  Introduction to the [facility]/ Intensive Cardiac Rehab. Initial oreintation packet reviewed with the patient. 30 Day ITP Review. [name] started cardiac rehab on 08/17/23. [name] did well with exercise for her fitness level 30 Day ITP Review. [name] started cardiac rehab on 08/17/23. [name] is off to a good start  with exercise for her fitness level 30 Day ITP Review. [name] has good participation with exercise at cardiac rehab when in attendance 30 Day ITP Review. [name] continues to have good  participation with exercise at cardiac rehab when in attendance    Row Name 11/04/23 0937           ITP Comments 30 Day ITP Review. [name] continues to have good attendance and  participation with exercise at cardiac rehab.          Comments: See ITP Comments

## 2023-11-09 ENCOUNTER — Encounter (HOSPITAL_COMMUNITY)
Admission: RE | Admit: 2023-11-09 | Discharge: 2023-11-09 | Disposition: A | Discharge status: Home / Self Care | Source: Ambulatory Visit | Attending: Cardiovascular Disease | Admitting: Cardiovascular Disease

## 2023-11-09 DIAGNOSIS — Z952 Presence of prosthetic heart valve: Secondary | ICD-10-CM

## 2023-11-10 ENCOUNTER — Ambulatory Visit (HOSPITAL_COMMUNITY)
Admission: RE | Admit: 2023-11-10 | Discharge: 2023-11-10 | Disposition: A | Discharge status: Home / Self Care | Source: Ambulatory Visit | Attending: Internal Medicine | Admitting: Internal Medicine

## 2023-11-10 DIAGNOSIS — M4620 Osteomyelitis of vertebra, site unspecified: Secondary | ICD-10-CM | POA: Insufficient documentation

## 2023-11-14 ENCOUNTER — Encounter (HOSPITAL_COMMUNITY)
Admission: RE | Admit: 2023-11-14 | Discharge: 2023-11-14 | Disposition: A | Discharge status: Home / Self Care | Source: Ambulatory Visit | Attending: Cardiovascular Disease | Admitting: Cardiovascular Disease

## 2023-11-14 DIAGNOSIS — Z952 Presence of prosthetic heart valve: Secondary | ICD-10-CM

## 2023-11-16 ENCOUNTER — Encounter (HOSPITAL_COMMUNITY)
Admission: RE | Admit: 2023-11-16 | Discharge: 2023-11-16 | Disposition: A | Discharge status: Home / Self Care | Source: Ambulatory Visit | Attending: Cardiovascular Disease | Admitting: Cardiovascular Disease

## 2023-11-16 ENCOUNTER — Ambulatory Visit: Attending: Physician Assistant | Admitting: Physician Assistant

## 2023-11-16 DIAGNOSIS — Z952 Presence of prosthetic heart valve: Secondary | ICD-10-CM | POA: Diagnosis not present

## 2023-11-16 NOTE — Progress Notes (Deleted)
  Cardiology Office Note:  .   Date:  11/16/2023  ID:  Olivia  DESERA [name], DOB 12-22-1955, MRN [id number] PCP: [name], PA-C  [facility] Providers Cardiologist:  Maude Emmer, MD Cardiology APP:  Madie Jon Garre, PA {  History of Present Illness: .   Olivia  BLAIRE [name] is a 68 y.o. 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  Also had episode of of atrial atrial flutter started on Eliquis , amiodarone . Reported worsening shortness of breath so echocardiogram repeated 07/2023.  EF hyperdynamic greater than 75%, stable valve  Hypertensive urgency ER visit 06/2023, with AKI and hyperkalemia.  Treated also for acute HFpEF exacerbation, discharged on Lasix  40 mg as needed   Social history       Patient with history of AVR performed January 2025.  She was seen last February 2025 and had reported shortness of breath so echocardiogram repeated but showed stable device function.  AVR with Bentall procedure Performed 05/2023. Most recent echocardiogram 07/2023, EF hyperdynamic greater than 75%, stable valve SBE prophylaxis, amoxicillin  2 g x 1 before procedure  Chronic HFpEF  Paroxysmal atrial flutter Noted perioperatively at the time of AVR.  No documented recurrences.  Hypertension   ROS: Denies: Chest pain, shortness of breath, orthopnea, peripheral edema, palpitations, decreased exercise intolerance, fatigue, lightheadedness.   Studies Reviewed: .         Risk Assessment/Calculations:   {Does this patient have ATRIAL FIBRILLATION?[phone]} No BP recorded.  {Refresh Note OR Click here to enter  BP  :1}***       Physical Exam:   VS:  There were no vitals taken for this visit.   Wt Readings from Last 3 Encounters:  11/01/23 201 lb 4.8 oz (91.3 kg)  08/11/23 199 lb 8.3 oz (90.5 kg)  08/09/23 198 lb (89.8 kg)    GEN: Well nourished, well developed in no acute distress NECK: No JVD; No carotid bruits CARDIAC: ***RRR, no murmurs, rubs, gallops RESPIRATORY:  Clear to auscultation without rales, wheezing or rhonchi  ABDOMEN: Soft, non-tender, non-distended EXTREMITIES:  No edema; No deformity   ASSESSMENT AND PLAN: .         {Are you ordering a CV Procedure (e.g. stress test, cath, DCCV, TEE, etc)?   Press F2        :[id number]}  Dispo: ***  Signed, Thom LITTIE Sluder, PA-C

## 2023-11-21 ENCOUNTER — Encounter (HOSPITAL_COMMUNITY)
Admission: RE | Admit: 2023-11-21 | Discharge: 2023-11-21 | Disposition: A | Discharge status: Home / Self Care | Source: Ambulatory Visit | Attending: Cardiovascular Disease | Admitting: Cardiovascular Disease

## 2023-11-21 DIAGNOSIS — Z952 Presence of prosthetic heart valve: Secondary | ICD-10-CM

## 2023-11-23 ENCOUNTER — Encounter (HOSPITAL_COMMUNITY)
Admission: RE | Admit: 2023-11-23 | Discharge: 2023-11-23 | Disposition: A | Discharge status: Home / Self Care | Source: Ambulatory Visit | Attending: Cardiovascular Disease | Admitting: Cardiovascular Disease

## 2023-11-23 DIAGNOSIS — Z952 Presence of prosthetic heart valve: Secondary | ICD-10-CM | POA: Insufficient documentation

## 2023-11-24 ENCOUNTER — Other Ambulatory Visit: Payer: Self-pay

## 2023-11-24 ENCOUNTER — Other Ambulatory Visit: Payer: Self-pay | Admitting: Internal Medicine

## 2023-11-24 ENCOUNTER — Ambulatory Visit: Admitting: Internal Medicine

## 2023-11-24 ENCOUNTER — Encounter: Payer: Self-pay | Admitting: Internal Medicine

## 2023-11-24 VITALS — BP 156/78 | HR 74 | Temp 98.2°F | Wt 201.0 lb

## 2023-11-24 DIAGNOSIS — M4620 Osteomyelitis of vertebra, site unspecified: Secondary | ICD-10-CM

## 2023-11-24 DIAGNOSIS — T847XXD Infection and inflammatory reaction due to other internal orthopedic prosthetic devices, implants and grafts, subsequent encounter: Secondary | ICD-10-CM

## 2023-11-24 DIAGNOSIS — B964 Proteus (mirabilis) (morganii) as the cause of diseases classified elsewhere: Secondary | ICD-10-CM

## 2023-11-24 NOTE — Patient Instructions (Signed)
 Your mri and how you feel been really great  The crp/sed rate stable. The higher crp I can't explain  It is prudent to monitor you closely though for 3-6 months; let's visit again 3 weeks for labs/evaluation   Stop bactrim  today

## 2023-11-24 NOTE — Progress Notes (Signed)
 [facility]  Patient Active Problem List   Diagnosis Date Noted   S/P AVR (aortic valve replacement) 06/30/2023   Morbid obesity (HCC) 06/29/2023   Dyspnea 06/28/2023   AKI (acute kidney injury) (HCC) 06/19/2023   Complete heart block (HCC) 06/17/2023   Hyperglycemia 06/17/2023   Fever 06/17/2023   S/P AVR (aortic valve replacement) and aortoplasty 06/14/2023   Acute CHF (congestive heart failure) (HCC) 04/28/2023   Acute on chronic heart failure with preserved ejection fraction (HCC) 04/27/2023   Severe aortic stenosis    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   Microcytic anemia 01/25/2023   Class 2 severe obesity due to excess calories with serious comorbidity and body mass index (BMI) of 38.0 to 38.9 in adult (HCC) 07/08/2022   Primary osteoarthritis of right knee 07/08/2022   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   Bacteremia due to Proteus species    Sepsis (HCC) 02/03/2022   Iron  deficiency anemia 02/03/2022   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   S/P lumbar spinal fusion 11/17/2021   Lumbar radiculopathy 04/21/2021   Type 2 diabetes mellitus without complication, without long-term current use of insulin  (HCC) 12/22/2018   Hypertriglyceridemia 10/19/2018   Spondylolisthesis of lumbar region 02/01/2018   Gastroesophageal reflux disease without esophagitis 01/16/2018   Skin inflammation 08/25/2017   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   Essential hypertension 03/06/2017   Osteoarthritis of left hip 04/07/2014   BMI 40.0-44.9, adult (HCC) 04/07/2014   DM type 2 (diabetes mellitus, type 2) (HCC)    Hypercholesterolemia    Reflux       Subjective:    Patient ID: [name]  [name], female    DOB: 1955/08/16, 68 y.o.   MRN: [id number]  No chief complaint on file.    HPI:  Olivia  CHRISTELLA [name] is a 68 y.o. female here for f/u hx lumbar surgical site  infection/lumbar OM with hardware associated infection  She had associated bacteremia proteus mirabilis blood stream infection 02/03/2022, in setting mri imaging lumbar surgical site abscess/cellulitis. S/p I&D with all old hardware removed but new screws placed on 9/15  04/07/22 id clinic visit She had finished iv ceftriaxone  of 8 weeks by 04/02/2022 and transitioned to bactrim  ss bid; the cefazolin  mic was 8  She walks with fww. She is sore in lower back bilaterally when moving but nothing in the middle. This is chronic. 7 at worst; 2/10 resting   No fever, chill, diarrhea   05/05/22 id clinic visit She had chart hx of sulfa  allergy although she doesn't recall any bactrim  use or other sulfa  product use in the past She tolerated bactrim  ss bid fine without n/v/diarrhea/rash Back pain is stable No f/c  She complains of 3-4 weeks right anterior thigh numbness/tingling. She wears tight clothing now and then but not consistently.   She has right lower ext weakness after back surgery but that is getting better. Lower back pain 3/10 not bad. Bilateral lower back sore/stiff after pt/ot session though. Overall since back surgery 01/2022 leg weakness/back pain better. Walks with walker still  Also complains of chronic tongue burning with things like toothpaste, spicy food. Sensation transient. Doesn't disturb her in other ways. This was heard while she was on ceftriaxone  as well.      10/26/22 id clinic f/u Crp has been high still. She continues on bactrim  ds 1 tab bid She still have significant nausea but controlled with  zofran  Back pain 2/10 minimal like before We spoke about trial off medication abx 07/2022 but given crp will keep going, until we get 2 normal over 6 months span before considering it again    04/26/23 id clinic f/u    Component Value Date/Time   CRP 35.2 (H) 11/01/2023 1136   CRP 31.1 (H) 08/09/2023 1119   CRP 2.7 (H) 06/29/2023 1844   Lab Results  Component Value  Date   CREATININE 1.58 (H) 08/09/2023   Patient is here for f/u today She has been seeing cardiology for aortic stenosis planned for January Patient still taking bactrim  suppressive abx She said she has gained a lot of weight and more dyspnea since seeing cardiology 2-3 weeks ago -- she hasn't spoken with them yet  She doesnot take any loop diuretics  The back is good. Right leg anterior pain stable/chronic since surgery No fever/chill  Appetite is too good.   08/09/23 id clinic f/u Reviewed 06/2023 labs Crp improving Doing well with back no new pain No f/c  She is s/p TAVR in 05/2023. Doing well with that -- no more dyspnea. And also the night pain in the hands also improves. No swelling in legs   Continues to take bactrim   Patient is on eliquis  for afib    11/24/23 id clinic f/u Mri ordered last visit 11/01/23 no sign of active infection; subluxed screws mentioned Mostly use cane; sometimes fww See a&p   Allergies: Allergies  Allergen Reactions   Crestor  [Rosuvastatin ] Other (See Comments)    Myalgia; patient can only tolerate taking 10 mg every other day   Robaxin  [Methocarbamol ] Other (See Comments)    Insomnia   2,4-D Dimethylamine    Toradol  [Ketorolac  Tromethamine ] Other (See Comments)   Zocor [Simvastatin] Other (See Comments)    Myalgias    Lipitor [Atorvastatin ] Other (See Comments)    Myalgia   Sulfa  Antibiotics Hives and Nausea And Vomiting      Outpatient Medications Prior to Visit  Medication Sig Dispense Refill   acetaminophen  (TYLENOL ) 500 MG tablet Take 1,000 mg by mouth every 6 (six) hours as needed for mild pain (pain score 1-3) or headache.     albuterol  (PROAIR  HFA) 108 (90 BASE) MCG/ACT inhaler Inhale 2 puffs into the lungs every 6 (six) hours as needed. 1 Inhaler 12   amiodarone  (PACERONE ) 200 MG tablet Take 1 tablet (200 mg total) by mouth daily. 90 tablet 3   amLODipine  (NORVASC ) 10 MG tablet Take 1 tablet (10 mg total) by mouth  daily. 90 tablet 1   amoxicillin  (AMOXIL ) 500 MG tablet Take 4 tablets (2000 mg) by mouth one hour prior to dental procedure. 4 tablet 1   apixaban  (ELIQUIS ) 5 MG TABS tablet Take 1 tablet (5 mg total) by mouth 2 (two) times daily. 60 tablet 3   ascorbic acid (VITAMIN C) 500 MG tablet Take 500 mg by mouth daily.     aspirin  EC 81 MG tablet Take 1 tablet (81 mg total) by mouth daily. Swallow whole.     celecoxib  (CELEBREX ) 200 MG capsule Take by mouth 2 (two) times daily.     cetirizine  (ZYRTEC ) 10 MG tablet Take 1 tablet (10 mg total) by mouth daily. 30 tablet 0   docusate sodium  (COLACE) 100 MG capsule Take 200-300 mg by mouth at bedtime.     ezetimibe  (ZETIA ) 10 MG tablet Take 1 tablet (10 mg total) by mouth daily. 90 tablet 3   fluticasone  (FLONASE ) 50 MCG/ACT nasal  spray Place 2 sprays into both nostrils daily. (Patient taking differently: Place 2 sprays into both nostrils daily as needed for rhinitis.) 16 g 0   furosemide  (LASIX ) 40 MG tablet Take 1 tablet (40 mg total) by mouth daily as needed for edema or fluid (shortness of breath). 90 tablet 3   gabapentin  (NEURONTIN ) 600 MG tablet Take 600 mg by mouth 3 (three) times daily.     iron  polysaccharides (NIFEREX) 150 MG capsule TAKE 1 CAPSULE (150 MG DOSE) BY MOUTH TWICE A DAY (Patient taking differently: Take 150 mg by mouth daily.) 180 capsule 1   losartan  (COZAAR ) 100 MG tablet Take 1 tablet (100 mg total) by mouth daily. 30 tablet 1   metFORMIN  (GLUCOPHAGE ) 1000 MG tablet Take 1 tablet (1,000 mg total) by mouth 2 (two) times daily with a meal. 180 tablet 1   metoprolol  tartrate (LOPRESSOR ) 25 MG tablet Take 1 tablet (25 mg total) by mouth 2 (two) times daily. 180 tablet 3   ondansetron  (ZOFRAN ) 8 MG tablet Take 8 mg by mouth 3 (three) times daily.     pantoprazole  (PROTONIX ) 40 MG tablet Take 40 mg by mouth daily.     polyvinyl alcohol (LIQUIFILM TEARS) 1.4 % ophthalmic solution Place 1 drop into both eyes as needed for dry eyes.      rosuvastatin  (CRESTOR ) 10 MG tablet Take 1 tablet (10 mg total) by mouth every other day. 45 tablet 3   Semaglutide , 2 MG/DOSE, (OZEMPIC , 2 MG/DOSE,) 8 MG/3ML SOPN Inject 2 mg into the skin once a week.     sulfamethoxazole -trimethoprim  (BACTRIM ) 400-80 MG tablet TAKE 1 TABLET BY MOUTH TWICE DAILY 60 tablet 0   VEOZAH 45 MG TABS Take 1 tablet by mouth daily.     No facility-administered medications prior to visit.     Social History   Socioeconomic History   Marital status: Married    Spouse name: Not on file   Number of children: 3   Years of education: Not on file   Highest education level: Not on file  Occupational History   Not on file  Tobacco Use   Smoking status: Never   Smokeless tobacco: Never  Vaping Use   Vaping status: Never Used  Substance and Sexual Activity   Alcohol use: No    Alcohol/week: 0.0 standard drinks of alcohol   Drug use: No   Sexual activity: Yes  Other Topics Concern   Not on file  Social History Narrative   Not on file   Social Drivers of Health   Financial Resource Strain: Low Risk  (10/09/2023)   Received from [facility]   Overall Financial Resource Strain (CARDIA)    Difficulty of Paying Living Expenses: Not very hard  Food Insecurity: No Food Insecurity (10/09/2023)   Received from [facility]   Hunger Vital Sign    Within the past 12 months, you worried that your food would run out before you got the money to buy more.: Never true    Within the past 12 months, the food you bought just didn't last and you didn't have money to get more.: Never true  Transportation Needs: No Transportation Needs (10/09/2023)   Received from [facility] - Transportation    Lack of Transportation (Medical): No    Lack of Transportation (Non-Medical): No  Physical Activity: Insufficiently Active (10/09/2023)   Received from [facility]   Exercise Vital Sign    On average, how many days per week do  you engage in moderate to strenuous  exercise (like a brisk walk)?: 3 days    On average, how many minutes do you engage in exercise at this level?: 20 min  Stress: No Stress Concern Present (10/09/2023)   Received from [facility] - Occupational Stress Questionnaire    Feeling of Stress : Only a little  Social Connections: Somewhat Isolated (10/09/2023)   Received from [facility]   Social Network    How would you rate your social network (family, work, friends)?: Restricted participation with some degree of social isolation  Intimate Partner Violence: Not At Risk (10/09/2023)   Received from [facility]   HITS    Over the last 12 months how often did your partner physically hurt you?: Never    Over the last 12 months how often did your partner insult you or talk down to you?: Never    Over the last 12 months how often did your partner threaten you with physical harm?: Never    Over the last 12 months how often did your partner scream or curse at you?: Never      Review of Systems    All other ros negative Objective:    There were no vitals taken for this visit. Nursing note and vital signs reviewed.  Physical Exam  General/constitutional: no distress, pleasant; walks with cane HEENT: Normocephalic, PER, Conj Clear, EOMI, Oropharynx clear Neck supple CV: rrr no mrg Lungs: clear to auscultation, normal respiratory effort Abd: Soft, Nontender Ext: facial plethora; bilateral LE trace edema Skin: No Rash Neuro: nonfocal MSK: no peripheral joint swelling/tenderness/warmth; back spines nontender      Labs: Lab Results  Component Value Date   WBC 6.1 08/09/2023   HGB 10.2 (L) 08/09/2023   HCT 33.3 (L) 08/09/2023   MCV 79.3 (L) 08/09/2023   PLT 273 08/09/2023   Last metabolic panel Lab Results  Component Value Date   GLUCOSE 85 08/09/2023   NA 145 08/09/2023   K 4.4 08/09/2023   CL 108 08/09/2023   CO2 26 08/09/2023   BUN 25 08/09/2023   CREATININE  1.58 (H) 08/09/2023   GFRNONAA 43 (L) 07/01/2023   CALCIUM  9.7 08/09/2023   PHOS 3.7 06/17/2023   PROT 7.3 08/09/2023   ALBUMIN  3.8 06/14/2023   LABGLOB 2.7 02/03/2023   AGRATIO 1.7 08/19/2017   BILITOT 0.2 08/09/2023   ALKPHOS 69 06/14/2023   AST 19 08/09/2023   ALT 18 08/09/2023   ANIONGAP 11 07/01/2023   Crp: 08/03/22     26.3   (<8) 04/2022    20.8   (<8)   Micro:  Serology:  Imaging: Reviewed  03/31/22 mri lumbar spine 1. Postoperative changes since the prior MRI. No findings suspicious for persistent discitis, epidural abscess or paraspinal abscess. 2. New marrow signal abnormality in the L2 and L3 vertebral bodies and subsequent enhancement but no enhancement in the disc space to suggest persistent discitis. This could be remodeling changes related to the recent surgery and new hardware. 3. Some residual prevertebral inflammatory changes but no rim enhancing abscess.   11/10/23 mri lumbar spine without contrast IMPRESSION: 1. No evidence of ongoing infection or acute fracture. 2. Stable grade 1 anterolisthesis at L3-4. 3. Posterior spinal hardware at L2, L4, L5, and S1 bilaterally with superior subluxation of pedicle screws bilaterally.  Assessment & Plan:   Problem List Items Addressed This Visit   None Visit Diagnoses  Hardware complicating wound infection, subsequent encounter    -  Primary     Vertebral osteomyelitis (HCC)              No orders of the defined types were placed in this encounter.    #Post-op lumbar infection SP I&D and HW removal with Cx+ Proteus mirabilis  on 02/05/22; and placement of new screws into infected spine #Psoas abscess #Proteus mirabilis bacteremia  -02/03/22 blood cultures 1/2 Proteus mirabilis.  Initially started on vancomycin , cefepime , metronidazole . - initial MRI L-spine showed epidural abscess extending anteriorly from soft tissue to fusion hardware L3-L4. 1.1x0.8 cm focal area in right psoas muscle  suspicious for small abscess - Underwent I&D with hardware removal on 9/15 with neurosurgery Dr. Gillie.  Of note all the old hardware was removed new pedicle screws were placed.  ID was engaged and plan for 8 weeks of antibiotics more.  OR cultures grew Proteus mirabilis with cefazolin  MIC 8  as such switched  cefazolin  to ceftriaxone .   - s/p 8 weeks ceftriaxone  by 11/10, switched to bactrim  ss bid chronically  05/05/22 id assessment Repeat mri lumbar spine 03/31/22 showed no further abscess Clinically also improving   08/03/22 id clinic assessment Doing very well Discuss with her trial off abx Labs today and f/u 6-8 weeks when she'll decide on abx continuation  10/26/22 id clinic assessment Due to still elevated crp without obvious explanation, and despite minimal back pain, will keep going with bactrim  for now until we have 2 normal at least 6 months apart She would like more zofran  amount in hand for her nausea with bactrim  Labs today for toxicity monitoring and crp monitoring Follow up 6 months Continue bactrim  1 ds tablet twice a day  04/26/23 id clinic assessment Back seems to be doing well AS awaiting tavr -- her infection is suppressed and shouldn't be a contraindication for tavr if needed Labs today Will revisit trial off abx at the appropriate time (after tavr and only with persistently normal crp) Continue bactrim  SS bid suppression   Advise patient to call cardiology clinic regarding recent more rapid weight gain/dyspnea.  08/09/23 id clinic assessment S/p tavr 05/2023 On eliquis  for afib Tolerating bactrim  No new sx with lower back  We discussed trial off abx for the back previously. For now given recent tavr I want to make sure she does well on that with cardiac rehab before considering trial off abx bactrim   Labs today  F/u 10 weeks   11/01/23 id clinic assessment Crp down trended and up trended 08/09/23 unclear reason Will repeat today She is 6 months out from  tavr Since I spoke with her 07/2023 nothing changed. Feeling well. Started cardiac rehab. Breathing a lot better along with activity tolerance No fever, chill No back pain, however has right lateral/anterior thigh pain. She does notice some weakness in the right leg after walking a while -- stable since surgery  She uses nervive for the pain which helps -- continue as needed  If numbers crp still high will scan the back If crp goes down and normalize x2 consecutive occasion, I'll feel better taking her off medication  F/u 3 months   ----------- Addendum Rising crp/esr but stable from last visit Can't explain it Will repeat mri l-spine    11/24/23 id clinic assessment Mri spine reviewed no concern of spine active infection outside of the subluxed screws. Stable minimal pain in the lower back  No fever chill Tolerating bactrim  suppression antibiotics; has  been on abx since initial infection/surgery late 01/2022      Component Value Date/Time   CRP 35.2 (H) 11/01/2023 1136   CRP 31.1 (H) 08/09/2023 1119   CRP 2.7 (H) 06/29/2023 1844   Lab Results  Component Value Date   ESRSEDRATE 33 (H) 11/01/2023   06/2023 sed rate 35  Discuss trial off abx again today and she agrees; her creatinine is on the moderately elevated level Last metabolic panel Lab Results  Component Value Date   GLUCOSE 85 08/09/2023   NA 145 08/09/2023   K 4.4 08/09/2023   CL 108 08/09/2023   CO2 26 08/09/2023   BUN 25 08/09/2023   CREATININE 1.58 (H) 08/09/2023   EGFR 46 (L) 07/15/2023   CALCIUM  9.7 08/09/2023   PHOS 3.7 06/17/2023   PROT 7.3 08/09/2023   ALBUMIN  3.8 06/14/2023   LABGLOB 2.7 02/03/2023   AGRATIO 1.7 08/19/2017   BILITOT 0.2 08/09/2023   ALKPHOS 69 06/14/2023   AST 19 08/09/2023   ALT 18 08/09/2023   ANIONGAP 11 07/01/2023     F/u closely for the next 3-6 months; 3 weeks from now, and will get one more set of labs at that time   Follow-up: Return in about 3 weeks (around  12/15/2023).      Constance ONEIDA Passer, MD [facility] 11/24/2023, 10:48 AM

## 2023-11-28 ENCOUNTER — Encounter (HOSPITAL_COMMUNITY)
Admission: RE | Admit: 2023-11-28 | Discharge: 2023-11-28 | Disposition: A | Discharge status: Home / Self Care | Source: Ambulatory Visit | Attending: Cardiovascular Disease | Admitting: Cardiovascular Disease

## 2023-11-28 DIAGNOSIS — Z952 Presence of prosthetic heart valve: Secondary | ICD-10-CM | POA: Diagnosis not present

## 2023-11-30 ENCOUNTER — Encounter (HOSPITAL_COMMUNITY)
Admission: RE | Admit: 2023-11-30 | Discharge: 2023-11-30 | Disposition: A | Discharge status: Home / Self Care | Source: Ambulatory Visit | Attending: Cardiovascular Disease | Admitting: Cardiovascular Disease

## 2023-11-30 DIAGNOSIS — Z952 Presence of prosthetic heart valve: Secondary | ICD-10-CM | POA: Diagnosis not present

## 2023-12-05 ENCOUNTER — Encounter (HOSPITAL_COMMUNITY)
Admission: RE | Admit: 2023-12-05 | Discharge: 2023-12-05 | Disposition: A | Discharge status: Home / Self Care | Source: Ambulatory Visit | Attending: Cardiovascular Disease | Admitting: Cardiovascular Disease

## 2023-12-05 DIAGNOSIS — Z952 Presence of prosthetic heart valve: Secondary | ICD-10-CM

## 2023-12-05 NOTE — Progress Notes (Signed)
 Cardiac Individual Treatment Plan  Patient Details  Name: [name]  JAYLEA PLOURDE MRN: [id number] Date of Birth: 12/12/1955 Referring Provider:   Flowsheet Row INTENSIVE CARDIAC REHAB ORIENT from 08/11/2023 in [facility]  Referring Provider Maude Emmer, MD    Initial Encounter Date:  Flowsheet Row INTENSIVE CARDIAC REHAB ORIENT from 08/11/2023 in [facility]  Date 08/11/23    Visit Diagnosis: 06/14/23 S/P AVR (aortic valve replacement)  Patient's Home Medications on Admission:  Current Outpatient Medications:    acetaminophen  (TYLENOL ) 500 MG tablet, Take 1,000 mg by mouth every 6 (six) hours as needed for mild pain (pain score 1-3) or headache., Disp: , Rfl:    albuterol  (PROAIR  HFA) 108 (90 BASE) MCG/ACT inhaler, Inhale 2 puffs into the lungs every 6 (six) hours as needed., Disp: 1 Inhaler, Rfl: 12   amiodarone  (PACERONE ) 200 MG tablet, Take 1 tablet (200 mg total) by mouth daily., Disp: 90 tablet, Rfl: 3   amLODipine  (NORVASC ) 10 MG tablet, Take 1 tablet (10 mg total) by mouth daily., Disp: 90 tablet, Rfl: 1   amoxicillin  (AMOXIL ) 500 MG tablet, Take 4 tablets (2000 mg) by mouth one hour prior to dental procedure. (Patient not taking: Reported on 11/24/2023), Disp: 4 tablet, Rfl: 1   apixaban  (ELIQUIS ) 5 MG TABS tablet, Take 1 tablet (5 mg total) by mouth 2 (two) times daily., Disp: 60 tablet, Rfl: 3   ascorbic acid (VITAMIN C) 500 MG tablet, Take 500 mg by mouth daily., Disp: , Rfl:    aspirin  EC 81 MG tablet, Take 1 tablet (81 mg total) by mouth daily. Swallow whole., Disp: , Rfl:    celecoxib  (CELEBREX ) 200 MG capsule, Take by mouth 2 (two) times daily., Disp: , Rfl:    cetirizine  (ZYRTEC ) 10 MG tablet, Take 1 tablet (10 mg total) by mouth daily., Disp: 30 tablet, Rfl: 0   docusate sodium  (COLACE) 100 MG capsule, Take 200-300 mg by mouth at bedtime., Disp: , Rfl:     ezetimibe  (ZETIA ) 10 MG tablet, Take 1 tablet (10 mg total) by mouth daily., Disp: 90 tablet, Rfl: 3   fluticasone  (FLONASE ) 50 MCG/ACT nasal spray, Place 2 sprays into both nostrils daily. (Patient taking differently: Place 2 sprays into both nostrils daily as needed for rhinitis.), Disp: 16 g, Rfl: 0   furosemide  (LASIX ) 40 MG tablet, Take 1 tablet (40 mg total) by mouth daily as needed for edema or fluid (shortness of breath)., Disp: 90 tablet, Rfl: 3   gabapentin  (NEURONTIN ) 600 MG tablet, Take 600 mg by mouth 3 (three) times daily., Disp: , Rfl:    iron  polysaccharides (NIFEREX) 150 MG capsule, TAKE 1 CAPSULE (150 MG DOSE) BY MOUTH TWICE A DAY (Patient taking differently: Take 150 mg by mouth daily.), Disp: 180 capsule, Rfl: 1   losartan  (COZAAR ) 100 MG tablet, Take 1 tablet (100 mg total) by mouth daily., Disp: 30 tablet, Rfl: 1   [Paused] metFORMIN  (GLUCOPHAGE ) 1000 MG tablet, Take 1 tablet (1,000 mg total) by mouth 2 (two) times daily with a meal., Disp: 180 tablet, Rfl: 1   metoprolol  tartrate (LOPRESSOR ) 25 MG tablet, Take 1 tablet (25 mg total) by mouth 2 (two) times daily., Disp: 180 tablet, Rfl: 3   ondansetron  (ZOFRAN ) 8 MG tablet, Take 8 mg by mouth 3 (three) times daily., Disp: , Rfl:    pantoprazole  (PROTONIX ) 40 MG tablet, Take 40 mg by mouth daily.,  Disp: , Rfl:    polyvinyl alcohol (LIQUIFILM TEARS) 1.4 % ophthalmic solution, Place 1 drop into both eyes as needed for dry eyes., Disp: , Rfl:    rosuvastatin  (CRESTOR ) 10 MG tablet, Take 1 tablet (10 mg total) by mouth every other day., Disp: 45 tablet, Rfl: 3   Semaglutide , 2 MG/DOSE, (OZEMPIC , 2 MG/DOSE,) 8 MG/3ML SOPN, Inject 2 mg into the skin once a week., Disp: , Rfl:    sulfamethoxazole -trimethoprim  (BACTRIM ) 400-80 MG tablet, TAKE 1 TABLET BY MOUTH TWICE DAILY, Disp: 60 tablet, Rfl: 0   VEOZAH 45 MG TABS, Take 1 tablet by mouth daily., Disp: , Rfl:   Past Medical History: Past Medical History:  Diagnosis Date   Allergy     Anemia    Anxiety    Arthritis    Asthma    BMI 40.0-44.9, adult (HCC) 04/07/2014   DM type 2 (diabetes mellitus, type 2) (HCC)    GERD (gastroesophageal reflux disease)    HTN (hypertension)    Hypercholesterolemia    Osteoarthritis of left hip 04/07/2014   Reflux    Severe aortic stenosis    Spinal headache    with C-Section and with spinal fusion in 2019    Tobacco Use: Social History   Tobacco Use  Smoking Status Never  Smokeless Tobacco Never    Labs: Review Flowsheet  More data exists      Latest Ref Rng & Units 02/03/2023 04/27/2023 04/28/2023 05/24/2023 06/14/2023  Labs for ITP Cardiac and Pulmonary Rehab  Cholestrol 100 - 199 mg/dL 846  - - - -  LDL (calc) 0 - 99 mg/dL 86  - - - -  Direct LDL 0 - 99 mg/dL - 893  - - -  HDL-C >60 mg/dL 43  - - - -  Trlycerides 0 - 149 mg/dL 864  - - - -  Hemoglobin A1c 4.8 - 5.6 % - - 6.2  5.9  -  PH, Arterial 7.35 - 7.45 - - - - 7.313  7.297  7.319  7.263  7.295  7.434  7.450  7.404   PCO2 arterial 32 - 48 mmHg - - - - 52.4  48.8  50.6  54.5  50.1  37.5  38.2  45.6   Bicarbonate 20.0 - 28.0 mmol/L - - - - 26.5  24.0  26.2  24.9  24.4  25.1  26.6  28.2  28.6   TCO2 22 - 32 mmol/L - - - - 28  25  28  27  23  26  25  26  26  28  27  30  30  27  26    Acid-base deficit 0.0 - 2.0 mmol/L - - - - 3.0  1.0  3.0  2.0   O2 Saturation % - - - - 98  98  97  87  100  100  100  63  100     Details       Multiple values from one day are sorted in reverse-chronological order         Capillary Blood Glucose: Lab Results  Component Value Date   GLUCAP 108 (H) 08/31/2023   GLUCAP 82 08/24/2023   GLUCAP 126 (H) 08/22/2023   GLUCAP 83 08/17/2023   GLUCAP 89 08/17/2023     Exercise Target Goals: Exercise Program Goal: Individual exercise prescription set using results from initial 6 min walk test and THRR while considering  patient's activity barriers and safety.   Exercise Prescription  Goal: Initial exercise prescription builds to  30-45 minutes a day of aerobic activity, 2-3 days per week.  Home exercise guidelines will be given to patient during program as part of exercise prescription that the participant will acknowledge.  Activity Barriers & Risk Stratification:  Activity Barriers & Cardiac Risk Stratification - 08/11/23 1421       Activity Barriers & Cardiac Risk Stratification   Activity Barriers Balance Concerns;Arthritis;Joint Problems;History of Falls;Back Problems;Deconditioning;Assistive Device;Muscular Weakness;Shortness of Breath;Neck/Spine Problems    Cardiac Risk Stratification High          6 Minute Walk:  6 Minute Walk     Row Name 08/11/23 1419 11/30/23 1047       6 Minute Walk   Phase Initial  Pt used her rollator Discharge    Distance 25.53 feet 1080 feet    Distance % Change -- 12.5 %    Distance Feet Change -- 120 ft    Walk Time 6 minutes 6 minutes    # of Rest Breaks 1  Pt stopped at 5:40, 20 seconds early 0    MPH 1.82 2.04    METS 2.15 2.14    RPE 13 11    Perceived Dyspnea  1 1    VO2 Peak 7.53 7.5    Symptoms Yes (comment) Yes (comment)    Comments 5/10 right knee pain Mild shorthness of breath, 5/10 right leg pain, chronic.    Resting HR 83 bpm 78 bpm    Resting BP 132/70 110/66    Resting Oxygen Saturation  98 % --    Exercise Oxygen Saturation  during 6 min walk 99 % 100 %    Max Ex. HR 105 bpm 112 bpm    Max Ex. BP 180/90 142/70    2 Minute Post BP 134/80 110/62       Oxygen Initial Assessment:   Oxygen Re-Evaluation:   Oxygen Discharge (Final Oxygen Re-Evaluation):   Initial Exercise Prescription:  Initial Exercise Prescription - 08/11/23 1400       Date of Initial Exercise RX and Referring Provider   Date 08/11/23    Referring Provider Maude Emmer, MD    Expected Discharge Date 11/02/23      NuStep   Level 3    SPM 75    Minutes 25    METs 2.1      Prescription Details   Frequency (times per week) 3    Duration Progress to 30 minutes of  continuous aerobic without signs/symptoms of physical distress      Intensity   THRR 40-80% of Max Heartrate 62-123    Ratings of Perceived Exertion 11-13    Perceived Dyspnea 0-4      Progression   Progression Continue progressive overload as per policy without signs/symptoms or physical distress.      Resistance Training   Training Prescription Yes    Weight 2 lbs    Reps 10-15          Perform Capillary Blood Glucose checks as needed.  Exercise Prescription Changes:   Exercise Prescription Changes     Row Name 08/17/23 1024 08/22/23 1015 09/05/23 1031 09/19/23 1031 10/03/23 1031     Response to Exercise   Blood Pressure (Admit) 118/80 118/80 156/82 124/70 166/80   Blood Pressure (Exercise) 166/80 132/80 158/80 -- --   Blood Pressure (Exit) 134/78 150/81 118/76 118/76 130/80   Heart Rate (Admit) 85 bpm 87 bpm 82 bpm 91 bpm 80 bpm   Heart  Rate (Exercise) 94 bpm 98 bpm 107 bpm 103 bpm 108 bpm   Heart Rate (Exit) 81 bpm 90 bpm 91 bpm 91 bpm 89 bpm   Rating of Perceived Exertion (Exercise) 9 11 11 10 12    Symptoms None None None None None   Comments Off to a good start with exercise. -- Will increase workload on NuStep next session from 3 to 4. Reviewed goals with Rosaline. Started the arm ergometer today.   Duration Progress to 30 minutes of  aerobic without signs/symptoms of physical distress Continue with 30 min of aerobic exercise without signs/symptoms of physical distress. Continue with 30 min of aerobic exercise without signs/symptoms of physical distress. Continue with 30 min of aerobic exercise without signs/symptoms of physical distress. Continue with 30 min of aerobic exercise without signs/symptoms of physical distress.   Intensity THRR unchanged THRR unchanged THRR unchanged THRR unchanged THRR unchanged     Progression   Progression Continue to progress workloads to maintain intensity without signs/symptoms of physical distress. Continue to progress workloads to  maintain intensity without signs/symptoms of physical distress. Continue to progress workloads to maintain intensity without signs/symptoms of physical distress. Continue to progress workloads to maintain intensity without signs/symptoms of physical distress. Continue to progress workloads to maintain intensity without signs/symptoms of physical distress.   Average METs 1.6 1.5 1.5 1.5 1.5     Resistance Training   Training Prescription No Yes Yes Yes Yes   Weight Relaxation day, no weights. 2 lbs 2 lbs 2 lbs 2 lbs   Reps -- 10-15 10-15 10-15 10-15   Time -- 10 Minutes 10 Minutes 10 Minutes 10 Minutes     Interval Training   Interval Training No No No No No     NuStep   Level 1 1 3 4 5    SPM 124 124 131 117 109   Minutes 25 30 30 30 15    METs 1.6 1.5 1.5 1.5 1.5     Arm Ergometer   Level -- -- -- -- 1   Watts -- -- -- -- 8   Minutes -- -- -- -- 15   METs -- -- -- -- 1.4     Home Exercise Plan   Plans to continue exercise at -- -- -- Home (comment)  Walking Home (comment)  Walking   Frequency -- -- -- Add 2 additional days to program exercise sessions. Add 2 additional days to program exercise sessions.   Initial Home Exercises Provided -- -- -- -- 09/26/23    Row Name 10/19/23 1037 11/02/23 1027 11/21/23 1033         Response to Exercise   Blood Pressure (Admit) 138/68 132/70 148/76     Blood Pressure (Exit) 120/70 114/68 130/66     Heart Rate (Admit) 84 bpm 90 bpm 92 bpm     Heart Rate (Exercise) 108 bpm 100 bpm 100 bpm     Heart Rate (Exit) 92 bpm 93 bpm 98 bpm     Rating of Perceived Exertion (Exercise) 13 12 12      Symptoms None None None     Duration Continue with 30 min of aerobic exercise without signs/symptoms of physical distress. Continue with 30 min of aerobic exercise without signs/symptoms of physical distress. Continue with 30 min of aerobic exercise without signs/symptoms of physical distress.     Intensity THRR unchanged THRR unchanged THRR unchanged        Progression   Progression Continue to progress workloads to maintain intensity without  signs/symptoms of physical distress. Continue to progress workloads to maintain intensity without signs/symptoms of physical distress. Continue to progress workloads to maintain intensity without signs/symptoms of physical distress.     Average METs 1.4 1.5 1.5       Resistance Training   Training Prescription No No Yes     Weight Relaxation day, no weights. Relaxation day, no weights. 2 lbs     Reps -- -- 10-15     Time -- -- 10 Minutes       Interval Training   Interval Training No No No       NuStep   Level 5 6 6      SPM -- 107 106     Minutes 15 15 15      METs -- 1.7 1.6       Arm Ergometer   Level 1 1 1      Watts 7 6 6      Minutes 15 15 15      METs 1.4 1.3 1.4       Home Exercise Plan   Plans to continue exercise at Home (comment)  Walking Home (comment)  Walking Home (comment)  Walking     Frequency Add 2 additional days to program exercise sessions. Add 2 additional days to program exercise sessions. Add 2 additional days to program exercise sessions.     Initial Home Exercises Provided 09/26/23 09/26/23 09/26/23        Exercise Comments:   Exercise Comments     Row Name 08/17/23 1130 09/19/23 1117 09/26/23 1100 09/28/23 1107 10/03/23 1110   Exercise Comments Paitent tolerated first exercise session well without symptoms. Oriented her to the stretching routine, modified to seated when needed. Reviewed goals with Rosaline. Reviewed home exercise guidelines and goals with Rosaline. Rosaline continues to progress well with exercise. Discussed trying an arm ergometer, and she is interested in starting the AE next week. Rosaline started the arm ergometer today and tolerated well without symptoms.    Row Name 10/24/23 1120 11/07/23 1116 11/28/23 1052       Exercise Comments Reviewed goals with Rosaline. Reviewed METs goals with Rosaline. Reviewed exercise action plan, METs, and goals with  Rosaline.        Exercise Goals and Review:   Exercise Goals     Row Name 08/11/23 1422             Exercise Goals   Increase Physical Activity Yes       Intervention Provide advice, education, support and counseling about physical activity/exercise needs.;Develop an individualized exercise prescription for aerobic and resistive training based on initial evaluation findings, risk stratification, comorbidities and participant's personal goals.       Expected Outcomes Short Term: Attend rehab on a regular basis to increase amount of physical activity.;Long Term: Exercising regularly at least 3-5 days a week.;Long Term: Add in home exercise to make exercise part of routine and to increase amount of physical activity.       Increase Strength and Stamina Yes       Intervention Provide advice, education, support and counseling about physical activity/exercise needs.;Develop an individualized exercise prescription for aerobic and resistive training based on initial evaluation findings, risk stratification, comorbidities and participant's personal goals.       Expected Outcomes Short Term: Increase workloads from initial exercise prescription for resistance, speed, and METs.;Short Term: Perform resistance training exercises routinely during rehab and add in resistance training at home;Long Term: Improve cardiorespiratory fitness, muscular endurance and strength  as measured by increased METs and functional capacity ( )       Able to understand and use rate of perceived exertion (RPE) scale Yes       Intervention Provide education and explanation on how to use RPE scale       Expected Outcomes Short Term: Able to use RPE daily in rehab to express subjective intensity level;Long Term:  Able to use RPE to guide intensity level when exercising independently       Knowledge and understanding of Target Heart Rate Range (THRR) Yes       Intervention Provide education and explanation of THRR including how  the numbers were predicted and where they are located for reference       Expected Outcomes Short Term: Able to state/look up THRR;Long Term: Able to use THRR to govern intensity when exercising independently;Short Term: Able to use daily as guideline for intensity in rehab       Understanding of Exercise Prescription Yes       Intervention Provide education, explanation, and written materials on patient's individual exercise prescription       Expected Outcomes Short Term: Able to explain program exercise prescription;Long Term: Able to explain home exercise prescription to exercise independently          Exercise Goals Re-Evaluation :  Exercise Goals Re-Evaluation     Row Name 08/17/23 1130 09/19/23 1117 09/26/23 1100 10/24/23 1120 11/07/23 1116     Exercise Goal Re-Evaluation   Exercise Goals Review Increase Physical Activity;Increase Strength and Stamina;Able to understand and use rate of perceived exertion (RPE) scale Increase Physical Activity;Increase Strength and Stamina;Able to understand and use rate of perceived exertion (RPE) scale Increase Physical Activity;Increase Strength and Stamina;Able to understand and use rate of perceived exertion (RPE) scale;Knowledge and understanding of Target Heart Rate Range (THRR);Understanding of Exercise Prescription Increase Physical Activity;Increase Strength and Stamina;Able to understand and use rate of perceived exertion (RPE) scale;Knowledge and understanding of Target Heart Rate Range (THRR);Understanding of Exercise Prescription Increase Physical Activity;Increase Strength and Stamina;Able to understand and use rate of perceived exertion (RPE) scale;Knowledge and understanding of Target Heart Rate Range (THRR);Understanding of Exercise Prescription   Comments Lakeisha  was able to understand and use RPE scale appropriately. Rosaline is walking 20 minutes 2 days/week in addition to exercise at cardiac rehab. Reviewed exercise prescription with  Rosaline. She is walking 20 minutes 2 days/week but is limited by knee and leg pain. She will look into seated exercise that she can do via video like Youtube. Her goal is to lose weight. Rosaline is making gradual progress with exercise. She feels the exercise helps, and she's walking 15-20 minutes 2 days/week as tolerated. She had back surgery in the past, and she is limited by chronic back pain. She is still working on her goal of losing weight. She likes doing the arm ergometer in addition to the recumbent stepper. Rosaline continues to make gradual progress with exercise. She feels the arm ergometer is helping a little with upper body strength.   Expected Outcomes Progress workloads as tolerated to help increase cardiorespiratory fitness. Continue walking at home in addition to exercise at cardiac rehab to help increase strength and stamina. Continue walking and increase workloads as tolerated. Continue to progress workloads as tolerated to help achieve personal health and fitness goals. Continue to progress workloads as tolerated.    Row Name 11/28/23 1052 11/30/23 1100           Exercise Goal Re-Evaluation  Exercise Goals Review Increase Physical Activity;Increase Strength and Stamina;Able to understand and use rate of perceived exertion (RPE) scale;Knowledge and understanding of Target Heart Rate Range (THRR);Understanding of Exercise Prescription Increase Physical Activity;Increase Strength and Stamina;Able to understand and use rate of perceived exertion (RPE) scale;Knowledge and understanding of Target Heart Rate Range (THRR);Understanding of Exercise Prescription      Comments Rosaline with complete cardiac rehab next week. She plans to continue exercise at the [facility]: 30 minutes, 3 days/week. Michelle's functional capacity increased 12% as measured by and strength increased 29% as measured by grip strength test. Her balance improved 1.63 seconds. Gave her links to seated  aerobics videos that she can do at home in addition to exercise at the [facility].      Expected Outcomes Rosaline locke continue exercise at [facility] upon completion of the cardiac rehab program. Rosaline locke continue [facility] at [facility] and seated exercises at home upon completion of the cardiac rehab program.         Discharge Exercise Prescription (Final Exercise Prescription Changes):  Exercise Prescription Changes - 11/21/23 1033       Response to Exercise   Blood Pressure (Admit) 148/76    Blood Pressure (Exit) 130/66    Heart Rate (Admit) 92 bpm    Heart Rate (Exercise) 100 bpm    Heart Rate (Exit) 98 bpm    Rating of Perceived Exertion (Exercise) 12    Symptoms None    Duration Continue with 30 min of aerobic exercise without signs/symptoms of physical distress.    Intensity THRR unchanged      Progression   Progression Continue to progress workloads to maintain intensity without signs/symptoms of physical distress.    Average METs 1.5      Resistance Training   Training Prescription Yes    Weight 2 lbs    Reps 10-15    Time 10 Minutes      Interval Training   Interval Training No      NuStep   Level 6    SPM 106    Minutes 15    METs 1.6      Arm Ergometer   Level 1    Watts 6    Minutes 15    METs 1.4      Home Exercise Plan   Plans to continue exercise at Home (comment)   Walking   Frequency Add 2 additional days to program exercise sessions.    Initial Home Exercises Provided 09/26/23          Nutrition:  Target Goals: Understanding of nutrition guidelines, daily intake of sodium 1500mg , cholesterol 200mg , calories 30% from fat and 7% or less from saturated fats, daily to have 5 or more servings of fruits and vegetables.  Biometrics:  Pre Biometrics - 08/11/23 1105       Pre Biometrics   Waist Circumference 47 inches    Hip Circumference 51.25 inches    Waist to Hip Ratio 0.92 %    Triceps Skinfold 33 mm     % Body Fat 53.3 %    Grip Strength 17 kg    Flexibility --   Not performed, hx of back surgery x 4   Single Leg Stand 5.57 seconds          Post Biometrics - 11/30/23 1100        Post  Biometrics   Waist Circumference 47.5 inches    Hip Circumference 51  inches    Waist to Hip Ratio 0.93 %    Triceps Skinfold 39 mm    Grip Strength 22 kg    Flexibility --   Not performed, hx of back surgery x 4   Single Leg Stand 7.25 seconds          Nutrition Therapy Plan and Nutrition Goals:  Nutrition Therapy & Goals - 11/18/23 1109       Nutrition Therapy   Diet Heart Healthy Diet    Drug/Food Interactions Statins/Certain Fruits      Personal Nutrition Goals   Nutrition Goal Patient to identify strategies for reducing cardiovascular risk by attending the [employer] education and nutrition series weekly.   goal in progress.   Personal Goal #2 Patient to improve diet quality by using the plate method as a guide for meal planning to include lean protein/plant protein, fruits, vegetables, whole grains, nonfat dairy as part of a well-balanced diet.   goal in progress.   Personal Goal #3 Patient to identify strategies for weight loss of 0.5-2.0# per week.   goal not met.   Comments Goals in progress. Tajae  has medical history of s/p AVR, chronic diastolic heart failure, PAF, HTN, DM2. LDL has improved but is not at goal (zetia , crestor ). Her A1c has improved on increased dose of Ozempic  (2mg ) and remains in a prediabetic range; per documentation, may switch to [email or web address] . She is motivated to lose weight; however, she is up 2.4# since starting with our program; she continues follow-up with [facility] (last visit 10/10/23) and is considering weight loss surgery.  She continues to attend the [employer] education and nutrition series regularly. Patient will benefit from participation in intensive cardiac rehab for nutrition, exercise, and lifestyle modification.      Intervention Plan    Intervention Prescribe, educate and counsel regarding individualized specific dietary modifications aiming towards targeted core components such as weight, hypertension, lipid management, diabetes, heart failure and other comorbidities.;Nutrition handout(s) given to patient.    Expected Outcomes Short Term Goal: Understand basic principles of dietary content, such as calories, fat, sodium, cholesterol and nutrients.;Long Term Goal: Adherence to prescribed nutrition plan.          Nutrition Assessments:  MEDIFICTS Score Key: >=70 Need to make dietary changes  40-70 Heart Healthy Diet <= 40 Therapeutic Level Cholesterol Diet    Picture Your Plate Scores: <59 Unhealthy dietary pattern with much room for improvement. 41-50 Dietary pattern unlikely to meet recommendations for good health and room for improvement. 51-60 More healthful dietary pattern, with some room for improvement.  >60 Healthy dietary pattern, although there may be some specific behaviors that could be improved.    Nutrition Goals Re-Evaluation:  Nutrition Goals Re-Evaluation     Row Name 08/19/23 1057 09/19/23 1209 10/21/23 1223 11/18/23 1109       Goals   Current Weight 200 lb 2.8 oz (90.8 kg) 200 lb 9.9 oz (91 kg) 203 lb 4.2 oz (92.2 kg) 201 lb 15.1 oz (91.6 kg)    Comment A1c 5.9, lipids WNL, direct LDL 106 no new labs; most recent labs A1c 5.9, lipids WNL, direct LDL 106 LDL 89, HDL 48, A1c 5.7 LDL 89, HDL 48, A1c 5.7    Expected Outcome Lutisha  has medical history of s/p AVR, chronic diastolic heart failure, PAF, HTN, DM2. LDL is not at goal (zetia , crestor ). Her A1c has improved on increased dose of Ozempic  (2mg ). She is motivated to lose weight. Patient will benefit from participation in  intensive cardiac rehab for nutrition, exercise, and lifestyle modification. Quanda  has medical history of s/p AVR, chronic diastolic heart failure, PAF, HTN, DM2. LDL is not at goal (zetia , crestor ). Her A1c has improved on  increased dose of Ozempic  (2mg ). She is motivated to lose weight; however, she is up 1.1# since starting with our program. She continues to attend the [facility] education and nutrition series regularly. Patient will benefit from participation in intensive cardiac rehab for nutrition, exercise, and lifestyle modification. Goals in progress. Cyndy  has medical history of s/p AVR, chronic diastolic heart failure, PAF, HTN, DM2. LDL has improved but is not at goal (zetia , crestor ). Her A1c has improved on increased dose of Ozempic  (2mg ) and remains in a prediabetic range; may switch to wegovy . She is motivated to lose weight; however, she is up 3.7# since starting with our program; she continues follow-up with [facility] (last visit 10/10/23) and is considering weight loss surgery. Per diet recall, her diet remains high in sugary drinks and refined carbohydrates. She continues to attend the [facility] education and nutrition series regularly. Patient will benefit from participation in intensive cardiac rehab for nutrition, exercise, and lifestyle modification. Goals in progress. Lessie  has medical history of s/p AVR, chronic diastolic heart failure, PAF, HTN, DM2. LDL has improved but is not at goal (zetia , crestor ). Her A1c has improved on increased dose of Ozempic  (2mg ) and remains in a prediabetic range; per documentation, may switch to wegovy . She is motivated to lose weight; however, she is up 2.4# since starting with our program; she continues follow-up with [facility] (last visit 10/10/23) and is considering weight loss surgery. She continues to attend the [facility] education and nutrition series regularly. Patient will benefit from participation in intensive cardiac rehab for nutrition, exercise, and lifestyle modification.       Nutrition Goals Re-Evaluation:  Nutrition Goals Re-Evaluation     Row Name 08/19/23 1057 09/19/23 1209 10/21/23 1223 11/18/23 1109       Goals    Current Weight 200 lb 2.8 oz (90.8 kg) 200 lb 9.9 oz (91 kg) 203 lb 4.2 oz (92.2 kg) 201 lb 15.1 oz (91.6 kg)    Comment A1c 5.9, lipids WNL, direct LDL 106 no new labs; most recent labs A1c 5.9, lipids WNL, direct LDL 106 LDL 89, HDL 48, A1c 5.7 LDL 89, HDL 48, A1c 5.7    Expected Outcome Kiela  has medical history of s/p AVR, chronic diastolic heart failure, PAF, HTN, DM2. LDL is not at goal (zetia , crestor ). Her A1c has improved on increased dose of Ozempic  (2mg ). She is motivated to lose weight. Patient will benefit from participation in intensive cardiac rehab for nutrition, exercise, and lifestyle modification. Aleea  has medical history of s/p AVR, chronic diastolic heart failure, PAF, HTN, DM2. LDL is not at goal (zetia , crestor ). Her A1c has improved on increased dose of Ozempic  (2mg ). She is motivated to lose weight; however, she is up 1.1# since starting with our program. She continues to attend the [facility] education and nutrition series regularly. Patient will benefit from participation in intensive cardiac rehab for nutrition, exercise, and lifestyle modification. Goals in progress. Zennie  has medical history of s/p AVR, chronic diastolic heart failure, PAF, HTN, DM2. LDL has improved but is not at goal (zetia , crestor ). Her A1c has improved on increased dose of Ozempic  (2mg ) and remains in a prediabetic range; may switch to wegovy . She is motivated to lose weight; however, she is up 3.7# since starting with our program;  she continues follow-up with [facility] (last visit 10/10/23) and is considering weight loss surgery. Per diet recall, her diet remains high in sugary drinks and refined carbohydrates. She continues to attend the [facility] education and nutrition series regularly. Patient will benefit from participation in intensive cardiac rehab for nutrition, exercise, and lifestyle modification. Goals in progress. Lucinda  has medical history of s/p AVR, chronic diastolic heart  failure, PAF, HTN, DM2. LDL has improved but is not at goal (zetia , crestor ). Her A1c has improved on increased dose of Ozempic  (2mg ) and remains in a prediabetic range; per documentation, may switch to wegovy . She is motivated to lose weight; however, she is up 2.4# since starting with our program; she continues follow-up with [facility] (last visit 10/10/23) and is considering weight loss surgery. She continues to attend the [facility] education and nutrition series regularly. Patient will benefit from participation in intensive cardiac rehab for nutrition, exercise, and lifestyle modification.       Nutrition Goals Discharge (Final Nutrition Goals Re-Evaluation):  Nutrition Goals Re-Evaluation - 11/18/23 1109       Goals   Current Weight 201 lb 15.1 oz (91.6 kg)    Comment LDL 89, HDL 48, A1c 5.7    Expected Outcome Goals in progress. Colleene  has medical history of s/p AVR, chronic diastolic heart failure, PAF, HTN, DM2. LDL has improved but is not at goal (zetia , crestor ). Her A1c has improved on increased dose of Ozempic  (2mg ) and remains in a prediabetic range; per documentation, may switch to wegovy . She is motivated to lose weight; however, she is up 2.4# since starting with our program; she continues follow-up with [facility] (last visit 10/10/23) and is considering weight loss surgery. She continues to attend the [facility] education and nutrition series regularly. Patient will benefit from participation in intensive cardiac rehab for nutrition, exercise, and lifestyle modification.          Psychosocial: Target Goals: Acknowledge presence or absence of significant depression and/or stress, maximize coping skills, provide positive support system. Participant is able to verbalize types and ability to use techniques and skills needed for reducing stress and depression.  Initial Review & Psychosocial Screening:  Initial Psych Review & Screening - 08/11/23 1432        Initial Review   Current issues with Current Stress Concerns    Comments Pt voices a low level stress in regards to her recent surgery      Family Dynamics   Good Support System? Yes   Pt has husband for support     Barriers   Psychosocial barriers to participate in program The patient should benefit from training in stress management and relaxation.      Screening Interventions   Interventions Encouraged to exercise    Expected Outcomes Short Term goal: Utilizing psychosocial counselor, staff and physician to assist with identification of specific Stressors or current issues interfering with healing process. Setting desired goal for each stressor or current issue identified.;Long Term Goal: Stressors or current issues are controlled or eliminated.;Short Term goal: Identification and review with participant of any Quality of Life or Depression concerns found by scoring the questionnaire.;Long Term goal: The participant improves quality of Life and PHQ9 Scores as seen by post scores and/or verbalization of changes          Quality of Life Scores:  Quality of Life - 08/11/23 1432       Quality of Life   Select Quality of Life  Quality of Life Scores   Health/Function Pre 25.53 %    Socioeconomic Pre 24.75 %    Psych/Spiritual Pre 25.29 %    Family Pre 27.3 %    GLOBAL Pre 25.61 %         Scores of 19 and below usually indicate a poorer quality of life in these areas.  A difference of  2-3 points is a clinically meaningful difference.  A difference of 2-3 points in the total score of the Quality of Life Index has been associated with significant improvement in overall quality of life, self-image, physical symptoms, and general health in studies assessing change in quality of life.  PHQ-9: Review Flowsheet  More data exists      11/01/2023 08/11/2023 08/09/2023 04/26/2023 10/26/2022  Depression screen PHQ 2/9  Decreased Interest 0 0 0 0 0  Down, Depressed, Hopeless 0 0 0 0 0   PHQ - 2 Score 0 0 0 0 0  Altered sleeping 0 0 - - -  Tired, decreased energy 0 1 - - -  Change in appetite 0 0 - - -  Feeling bad or failure about yourself  0 0 - - -  Trouble concentrating 0 0 - - -  Moving slowly or fidgety/restless 0 0 - - -  Suicidal thoughts 0 0 - - -  PHQ-9 Score 0 1 - - -  Difficult doing work/chores Not difficult at all Not difficult at all - - -   Interpretation of Total Score  Total Score Depression Severity:  1-4 = Minimal depression, 5-9 = Mild depression, 10-14 = Moderate depression, 15-19 = Moderately severe depression, 20-27 = Severe depression   Psychosocial Evaluation and Intervention:   Psychosocial Re-Evaluation:  Psychosocial Re-Evaluation     Row Name 08/18/23 0813 09/09/23 0830 10/11/23 1007 11/04/23 0937 11/29/23 1449     Psychosocial Re-Evaluation   Current issues with Current Stress Concerns Current Stress Concerns Current Stress Concerns Current Stress Concerns Current Stress Concerns   Comments Rosaline started cardiac rehab on 08/17/23. Rosaline did not not voice any increased concerns or stressors. Rosaline admits to having low energy post open heart surgery. Rosaline hopes that that her energy level will improve participaitng in cardiac rehab. Rosaline has not voiced any increased concerns or stressors during exercise at cardiac rehab. Rosaline continues  not to  voice any increased concerns or stressors during exercise at cardiac rehab. Rosaline continues  not to  voice any increased concerns or stressors during exercise at cardiac rehab. Rosaline continues  not to  voice any increased concerns or stressors during exercise at cardiac rehab. Rosaline will tenatively complete cardiac rehab on 12/07/23.   Expected Outcomes Rosaline will have decreaseed or contolled stress upon completion of cardiac rehab Rosaline will have decreaseed or contolled stress upon completion of cardiac rehab Rosaline will have decreaseed or contolled stress upon  completion of cardiac rehab Rosaline will have decreaseed or contolled stress upon completion of cardiac rehab Rosaline will have decreaseed or contolled stress upon completion of cardiac rehab   Interventions Stress management education;Encouraged to attend Cardiac Rehabilitation for the exercise;Relaxation education Stress management education;Encouraged to attend Cardiac Rehabilitation for the exercise;Relaxation education Stress management education;Encouraged to attend Cardiac Rehabilitation for the exercise;Relaxation education Stress management education;Encouraged to attend Cardiac Rehabilitation for the exercise;Relaxation education Stress management education;Encouraged to attend Cardiac Rehabilitation for the exercise;Relaxation education   Continue Psychosocial Services  Follow up required by staff No Follow up required No Follow up required No Follow  up required No Follow up required     Initial Review   Source of Stress Concerns Unable to participate in former interests or hobbies;Unable to perform yard/household activities;Chronic Illness Unable to participate in former interests or hobbies;Unable to perform yard/household activities;Chronic Illness Unable to participate in former interests or hobbies;Unable to perform yard/household activities;Chronic Illness Unable to participate in former interests or hobbies;Unable to perform yard/household activities;Chronic Illness Unable to participate in former interests or hobbies;Unable to perform yard/household activities;Chronic Illness   Comments Will continue to monitor and offer support as needed Will continue to monitor and offer support as needed Will continue to monitor and offer support as needed Will continue to monitor and offer support as needed Will continue to monitor and offer support as needed      Psychosocial Discharge (Final Psychosocial Re-Evaluation):  Psychosocial Re-Evaluation - 11/29/23 1449       Psychosocial  Re-Evaluation   Current issues with Current Stress Concerns    Comments Rosaline continues  not to  voice any increased concerns or stressors during exercise at cardiac rehab. Rosaline will tenatively complete cardiac rehab on 12/07/23.    Expected Outcomes Rosaline will have decreaseed or contolled stress upon completion of cardiac rehab    Interventions Stress management education;Encouraged to attend Cardiac Rehabilitation for the exercise;Relaxation education    Continue Psychosocial Services  No Follow up required      Initial Review   Source of Stress Concerns Unable to participate in former interests or hobbies;Unable to perform yard/household activities;Chronic Illness    Comments Will continue to monitor and offer support as needed          Vocational Rehabilitation: Provide vocational rehab assistance to qualifying candidates.   Vocational Rehab Evaluation & Intervention:  Vocational Rehab - 08/11/23 1433       Initial Vocational Rehab Evaluation & Intervention   Assessment shows need for Vocational Rehabilitation No   pt is retired         Education: Education Goals: Education classes will be provided on a weekly basis, covering required topics. Participant will state understanding/return demonstration of topics presented.    Education     Row Name 08/17/23 1300     Education   Cardiac Education Topics P[facility]in   Orthoptist   Educator Dietitian   Weekly Topic Comforting Weekend Breakfasts   Instruction Review Code 1- Verbalizes Understanding   Class Start Time 1145   Class Stop Time 1228   Class Time Calculation (min) 43 min    Row Name 08/22/23 1100     Education   Cardiac Education Topics Pritikin   Select Core Videos     Core Videos   Educator Exercise Physiologist   Select Exercise Education   Exercise Education Biomechanial Limitations   Instruction Review Code 1- Verbalizes Understanding   Class Start Time 1145    Class Stop Time 1218   Class Time Calculation (min) 33 min    Row Name 08/24/23 1100     Education   Cardiac Education Topics Pritikin   Customer service manager   Weekly Topic Fast Evening Meals   Instruction Review Code 1- Verbalizes Understanding   Class Start Time 1145   Class Stop Time 1225   Class Time Calculation (min) 40 min    Row Name 08/31/23 1500     Education   Cardiac Education Topics Pritikin   [employer]  School     Systems analyst Cuisine- Spotlight on the [employer]   Instruction Review Code 1- Verbalizes Understanding   Class Start Time 1145   Class Stop Time 1225   Class Time Calculation (min) 40 min    Row Name 09/05/23 1000     Education   Cardiac Education Topics Pritikin   Select Workshops     Workshops   Educator Exercise Physiologist   Select Psychosocial   Psychosocial Workshop Healthy Sleep for a Healthy Heart   Instruction Review Code 1- Verbalizes Understanding   Class Start Time 1148   Class Stop Time 1234   Class Time Calculation (min) 46 min    Row Name 09/12/23 1300     Education   Cardiac Education Topics Pritikin   Select Workshops     Workshops   Educator Exercise Physiologist   Select Exercise   Exercise Workshop Managing Heart Disease: Your Path to a Healthier Heart   Instruction Review Code 1- Verbalizes Understanding   Class Start Time 1145   Class Stop Time 1237   Class Time Calculation (min) 52 min    Row Name 09/14/23 1300     Education   Cardiac Education Topics Pritikin   Customer service manager   Weekly Topic Powerhouse Plant-Based Proteins   Instruction Review Code 1- Verbalizes Understanding   Class Start Time 1145   Class Stop Time 1222   Class Time Calculation (min) 37 min    Row Name 09/19/23 1100     Education   Cardiac Education Topics Pritikin   Customer service manager Psychosocial   Psychosocial Workshop From Head to Heart: The Power of a Healthy Outlook   Instruction Review Code 1- Verbalizes Understanding   Class Start Time 1147   Class Stop Time 1234   Class Time Calculation (min) 47 min    Row Name 09/26/23 1100     Education   Cardiac Education Topics Pritikin   Nurse, children's Exercise Physiologist   Select Psychosocial   Psychosocial Healthy Minds, Bodies, Hearts   Instruction Review Code 1- Verbalizes Understanding   Class Start Time 1147   Class Stop Time 1221   Class Time Calculation (min) 34 min    Row Name 09/28/23 1400     Education   Cardiac Education Topics Pritikin   Customer service manager   Weekly Topic Adding Flavor - Sodium-Free   Instruction Review Code 1- Verbalizes Understanding   Class Start Time 1145   Class Stop Time 1221   Class Time Calculation (min) 36 min    Row Name 10/03/23 1300     Education   Cardiac Education Topics Pritikin   Glass blower/designer Nutrition   Nutrition Workshop Label Reading   Instruction Review Code 1- Verbalizes Understanding   Class Start Time 1145   Class Stop Time 1228   Class Time Calculation (min) 43 min    Row Name 10/10/23 1100     Education   Cardiac Education Topics Pritikin   Hospital doctor Education  Instruction Review Code 1- Verbalizes Understanding   Class Start Time 1150   Class Stop Time 1230   Class Time Calculation (min) 40 min    Row Name 10/12/23 1400     Education   Cardiac Education Topics Pritikin   Orthoptist   Educator Dietitian   Weekly Topic Personalizing Your Pritikin Plate   Instruction Review Code 1- Verbalizes Understanding   Class Start Time 1145    Class Stop Time 1220   Class Time Calculation (min) 35 min    Row Name 10/19/23 1500     Education   Cardiac Education Topics Pritikin   Customer service manager   Weekly Topic [employer] Desserts   Instruction Review Code 1- Verbalizes Understanding   Class Start Time 1145   Class Stop Time 1225   Class Time Calculation (min) 40 min    Row Name 10/26/23 1500     Education   Cardiac Education Topics Pritikin   Customer service manager   Weekly Topic Efficiency Cooking - Meals in a Snap   Instruction Review Code 1- Verbalizes Understanding   Class Start Time 1145   Class Stop Time 1222   Class Time Calculation (min) 37 min    Row Name 11/02/23 1300     Education   Cardiac Education Topics Pritikin   Customer service manager   Weekly Topic One-Pot Wonders   Instruction Review Code 1- Verbalizes Understanding   Class Start Time 1145   Class Stop Time 1220   Class Time Calculation (min) 35 min    Row Name 11/07/23 1300     Education   Cardiac Education Topics Pritikin   Geographical information systems officer Psychosocial   Psychosocial Workshop Focused Goals, Sustainable Changes   Instruction Review Code 1- Verbalizes Understanding   Class Start Time 1155   Class Stop Time 1235   Class Time Calculation (min) 40 min    Row Name 11/09/23 1300     Education   Cardiac Education Topics Pritikin   Customer service manager   Weekly Topic Comforting Weekend Breakfasts   Instruction Review Code 1- Verbalizes Understanding   Class Start Time 1145   Class Stop Time 1220   Class Time Calculation (min) 35 min    Row Name 11/16/23 1300     Education   Cardiac Education Topics Pritikin   Customer service manager   Weekly Topic Fast  Evening Meals   Instruction Review Code 1- Verbalizes Understanding   Class Start Time 1145   Class Stop Time 1225   Class Time Calculation (min) 40 min    Row Name 11/21/23 1600     Education   Cardiac Education Topics Pritikin   Glass blower/designer Nutrition   Nutrition Workshop Fueling a Forensic psychologist   Instruction Review Code 1- Tax inspector   Class Start Time 1145   Class Stop Time 1230   Class Time Calculation (min) 45 min    Row Name 12/05/23 1200     Education   Cardiac Education Topics Pritikin  Select Core Videos     Core Videos   Educator Exercise Industrial/product designer Education   General Education Hypertension and Heart Disease   Instruction Review Code 1- Verbalizes Understanding   Class Start Time 1150   Class Stop Time 1225   Class Time Calculation (min) 35 min      Core Videos: Exercise    Move It!  Clinical staff conducted group or individual video education with verbal and written material and guidebook.  Patient learns the recommended [facility]in exercise program. Exercise with the goal of living a long, healthy life. Some of the health benefits of exercise include controlled diabetes, healthier blood pressure levels, improved cholesterol levels, improved heart and lung capacity, improved sleep, and better body composition. Everyone should speak with their doctor before starting or changing an exercise routine.  Biomechanical Limitations Clinical staff conducted group or individual video education with verbal and written material and guidebook.  Patient learns how biomechanical limitations can impact exercise and how we can mitigate and possibly overcome limitations to have an impactful and balanced exercise routine.  Body Composition Clinical staff conducted group or individual video education with verbal and written material and guidebook.  Patient learns that body composition (ratio of  muscle mass to fat mass) is a key component to assessing overall fitness, rather than body weight alone. Increased fat mass, especially visceral belly fat, can put us  at increased risk for metabolic syndrome, type 2 diabetes, heart disease, and even death. It is recommended to combine diet and exercise (cardiovascular and resistance training) to improve your body composition. Seek guidance from your physician and exercise physiologist before implementing an exercise routine.  Exercise Action Plan Clinical staff conducted group or individual video education with verbal and written material and guidebook.  Patient learns the recommended strategies to achieve and enjoy long-term exercise adherence, including variety, self-motivation, self-efficacy, and positive decision making. Benefits of exercise include fitness, good health, weight management, more energy, better sleep, less stress, and overall well-being.  Medical   Heart Disease Risk Reduction Clinical staff conducted group or individual video education with verbal and written material and guidebook.  Patient learns our heart is our most vital organ as it circulates oxygen, nutrients, white blood cells, and hormones throughout the entire body, and carries waste away. Data supports a plant-based eating plan like the Pritikin Program for its effectiveness in slowing progression of and reversing heart disease. The video provides a number of recommendations to address heart disease.   Metabolic Syndrome and Belly Fat  Clinical staff conducted group or individual video education with verbal and written material and guidebook.  Patient learns what metabolic syndrome is, how it leads to heart disease, and how one can reverse it and keep it from coming back. You have metabolic syndrome if you have 3 of the following 5 criteria: abdominal obesity, high blood pressure, high triglycerides, low HDL cholesterol, and high blood sugar.  Hypertension and Heart  Disease Clinical staff conducted group or individual video education with verbal and written material and guidebook.  Patient learns that high blood pressure, or hypertension, is very common in the [place] . Hypertension is largely due to excessive salt intake, but other important risk factors include being overweight, physical inactivity, drinking too much alcohol, smoking, and not eating enough potassium from fruits and vegetables. High blood pressure is a leading risk factor for heart attack, stroke, congestive heart failure, dementia, kidney failure, and premature death. Long-term effects of excessive salt intake include stiffening  of the arteries and thickening of heart muscle and organ damage. Recommendations include ways to reduce hypertension and the risk of heart disease.  Diseases of Our Time - Focusing on Diabetes Clinical staff conducted group or individual video education with verbal and written material and guidebook.  Patient learns why the best way to stop diseases of our time is prevention, through food and other lifestyle changes. Medicine (such as prescription pills and surgeries) is often only a Band-Aid on the problem, not a long-term solution. Most common diseases of our time include obesity, type 2 diabetes, hypertension, heart disease, and cancer. The Pritikin Program is recommended and has been proven to help reduce, reverse, and/or prevent the damaging effects of metabolic syndrome.  Nutrition   Overview of the Pritikin Eating Plan  Clinical staff conducted group or individual video education with verbal and written material and guidebook.  Patient learns about the Pritikin Eating Plan for disease risk reduction. The Pritikin Eating Plan emphasizes a wide variety of unrefined, minimally-processed carbohydrates, like fruits, vegetables, whole grains, and legumes. Go, Caution, and Stop food choices are explained. Plant-based and lean animal proteins are emphasized. Rationale  provided for low sodium intake for blood pressure control, low added sugars for blood sugar stabilization, and low added fats and oils for coronary artery disease risk reduction and weight management.  Calorie Density  Clinical staff conducted group or individual video education with verbal and written material and guidebook.  Patient learns about calorie density and how it impacts the Pritikin Eating Plan. Knowing the characteristics of the food you choose will help you decide whether those foods will lead to weight gain or weight loss, and whether you want to consume more or less of them. Weight loss is usually a side effect of the Pritikin Eating Plan because of its focus on low calorie-dense foods.  Label Reading  Clinical staff conducted group or individual video education with verbal and written material and guidebook.  Patient learns about the Pritikin recommended label reading guidelines and corresponding recommendations regarding calorie density, added sugars, sodium content, and whole grains.  Dining Out - Part 1  Clinical staff conducted group or individual video education with verbal and written material and guidebook.  Patient learns that restaurant meals can be sabotaging because they can be so high in calories, fat, sodium, and/or sugar. Patient learns recommended strategies on how to positively address this and avoid unhealthy pitfalls.  Facts on Fats  Clinical staff conducted group or individual video education with verbal and written material and guidebook.  Patient learns that lifestyle modifications can be just as effective, if not more so, as many medications for lowering your risk of heart disease. A Pritikin lifestyle can help to reduce your risk of inflammation and atherosclerosis (cholesterol build-up, or plaque, in the artery walls). Lifestyle interventions such as dietary choices and physical activity address the cause of atherosclerosis. A review of the types of fats and  their impact on blood cholesterol levels, along with dietary recommendations to reduce fat intake is also included.  Nutrition Action Plan  Clinical staff conducted group or individual video education with verbal and written material and guidebook.  Patient learns how to incorporate Pritikin recommendations into their lifestyle. Recommendations include planning and keeping personal health goals in mind as an important part of their success.  Healthy Mind-Set    Healthy Minds, Bodies, Hearts  Clinical staff conducted group or individual video education with verbal and written material and guidebook.  Patient learns how to  identify when they are stressed. Video will discuss the impact of that stress, as well as the many benefits of stress management. Patient will also be introduced to stress management techniques. The way we think, act, and feel has an impact on our hearts.  How Our Thoughts Can Heal Our Hearts  Clinical staff conducted group or individual video education with verbal and written material and guidebook.  Patient learns that negative thoughts can cause depression and anxiety. This can result in negative lifestyle behavior and serious health problems. Cognitive behavioral therapy is an effective method to help control our thoughts in order to change and improve our emotional outlook.  Additional Videos:  Exercise    Improving Performance  Clinical staff conducted group or individual video education with verbal and written material and guidebook.  Patient learns to use a non-linear approach by alternating intensity levels and lengths of time spent exercising to help burn more calories and lose more body fat. Cardiovascular exercise helps improve heart health, metabolism, hormonal balance, blood sugar control, and recovery from fatigue. Resistance training improves strength, endurance, balance, coordination, reaction time, metabolism, and muscle mass. Flexibility exercise improves  circulation, posture, and balance. Seek guidance from your physician and exercise physiologist before implementing an exercise routine and learn your capabilities and proper form for all exercise.  Introduction to Yoga  Clinical staff conducted group or individual video education with verbal and written material and guidebook.  Patient learns about yoga, a discipline of the coming together of mind, breath, and body. The benefits of yoga include improved flexibility, improved range of motion, better posture and core strength, increased lung function, weight loss, and positive self-image. Yoga's heart health benefits include lowered blood pressure, healthier heart rate, decreased cholesterol and triglyceride levels, improved immune function, and reduced stress. Seek guidance from your physician and exercise physiologist before implementing an exercise routine and learn your capabilities and proper form for all exercise.  Medical   Aging: Enhancing Your Quality of Life  Clinical staff conducted group or individual video education with verbal and written material and guidebook.  Patient learns key strategies and recommendations to stay in good physical health and enhance quality of life, such as prevention strategies, having an advocate, securing a Health Care Proxy and Power of Attorney, and keeping a list of medications and system for tracking them. It also discusses how to avoid risk for bone loss.  Biology of Weight Control  Clinical staff conducted group or individual video education with verbal and written material and guidebook.  Patient learns that weight gain occurs because we consume more calories than we burn (eating more, moving less). Even if your body weight is normal, you may have higher ratios of fat compared to muscle mass. Too much body fat puts you at increased risk for cardiovascular disease, heart attack, stroke, type 2 diabetes, and obesity-related cancers. In addition to exercise,  following the Pritikin Eating Plan can help reduce your risk.  Decoding Lab Results  Clinical staff conducted group or individual video education with verbal and written material and guidebook.  Patient learns that lab test reflects one measurement whose values change over time and are influenced by many factors, including medication, stress, sleep, exercise, food, hydration, pre-existing medical conditions, and more. It is recommended to use the knowledge from this video to become more involved with your lab results and evaluate your numbers to speak with your doctor.   Diseases of Our Time - Overview  Clinical staff conducted group or individual video  education with verbal and written material and guidebook.  Patient learns that according to the CDC, 50% to 70% of chronic diseases (such as obesity, type 2 diabetes, elevated lipids, hypertension, and heart disease) are avoidable through lifestyle improvements including healthier food choices, listening to satiety cues, and increased physical activity.  Sleep Disorders Clinical staff conducted group or individual video education with verbal and written material and guidebook.  Patient learns how good quality and duration of sleep are important to overall health and well-being. Patient also learns about sleep disorders and how they impact health along with recommendations to address them, including discussing with a physician.  Nutrition  Dining Out - Part 2 Clinical staff conducted group or individual video education with verbal and written material and guidebook.  Patient learns how to plan ahead and communicate in order to maximize their dining experience in a healthy and nutritious manner. Included are recommended food choices based on the type of restaurant the patient is visiting.   Fueling a Banker conducted group or individual video education with verbal and written material and guidebook.  There is a strong  connection between our food choices and our health. Diseases like obesity and type 2 diabetes are very prevalent and are in large-part due to lifestyle choices. The Pritikin Eating Plan provides plenty of food and hunger-curbing satisfaction. It is easy to follow, affordable, and helps reduce health risks.  Menu Workshop  Clinical staff conducted group or individual video education with verbal and written material and guidebook.  Patient learns that restaurant meals can sabotage health goals because they are often packed with calories, fat, sodium, and sugar. Recommendations include strategies to plan ahead and to communicate with the manager, chef, or server to help order a healthier meal.  Planning Your Eating Strategy  Clinical staff conducted group or individual video education with verbal and written material and guidebook.  Patient learns about the Pritikin Eating Plan and its benefit of reducing the risk of disease. The Pritikin Eating Plan does not focus on calories. Instead, it emphasizes high-quality, nutrient-rich foods. By knowing the characteristics of the foods, we choose, we can determine their calorie density and make informed decisions.  Targeting Your Nutrition Priorities  Clinical staff conducted group or individual video education with verbal and written material and guidebook.  Patient learns that lifestyle habits have a tremendous impact on disease risk and progression. This video provides eating and physical activity recommendations based on your personal health goals, such as reducing LDL cholesterol, losing weight, preventing or controlling type 2 diabetes, and reducing high blood pressure.  Vitamins and Minerals  Clinical staff conducted group or individual video education with verbal and written material and guidebook.  Patient learns different ways to obtain key vitamins and minerals, including through a recommended healthy diet. It is important to discuss all supplements  you take with your doctor.   Healthy Mind-Set    Smoking Cessation  Clinical staff conducted group or individual video education with verbal and written material and guidebook.  Patient learns that cigarette smoking and tobacco addiction pose a serious health risk which affects millions of people. Stopping smoking will significantly reduce the risk of heart disease, lung disease, and many forms of cancer. Recommended strategies for quitting are covered, including working with your doctor to develop a successful plan.  Culinary   Becoming a Set designer conducted group or individual video education with verbal and written material and guidebook.  Patient learns that  cooking at home can be healthy, cost-effective, quick, and puts them in control. Keys to cooking healthy recipes will include looking at your recipe, assessing your equipment needs, planning ahead, making it simple, choosing cost-effective seasonal ingredients, and limiting the use of added fats, salts, and sugars.  Cooking - Breakfast and Snacks  Clinical staff conducted group or individual video education with verbal and written material and guidebook.  Patient learns how important breakfast is to satiety and nutrition through the entire day. Recommendations include key foods to eat during breakfast to help stabilize blood sugar levels and to prevent overeating at meals later in the day. Planning ahead is also a key component.  Cooking - Educational psychologist conducted group or individual video education with verbal and written material and guidebook.  Patient learns eating strategies to improve overall health, including an approach to cook more at home. Recommendations include thinking of animal protein as a side on your plate rather than center stage and focusing instead on lower calorie dense options like vegetables, fruits, whole grains, and plant-based proteins, such as beans. Making sauces in large  quantities to freeze for later and leaving the skin on your vegetables are also recommended to maximize your experience.  Cooking - Healthy Salads and Dressing Clinical staff conducted group or individual video education with verbal and written material and guidebook.  Patient learns that vegetables, fruits, whole grains, and legumes are the foundations of the Pritikin Eating Plan. Recommendations include how to incorporate each of these in flavorful and healthy salads, and how to create homemade salad dressings. Proper handling of ingredients is also covered. Cooking - Soups and [employer] - Soups and Desserts Clinical staff conducted group or individual video education with verbal and written material and guidebook.  Patient learns that Pritikin soups and desserts make for easy, nutritious, and delicious snacks and meal components that are low in sodium, fat, sugar, and calorie density, while high in vitamins, minerals, and filling fiber. Recommendations include simple and healthy ideas for soups and desserts.   Overview     The Pritikin Solution Program Overview Clinical staff conducted group or individual video education with verbal and written material and guidebook.  Patient learns that the results of the Pritikin Program have been documented in more than 100 articles published in peer-reviewed journals, and the benefits include reducing risk factors for (and, in some cases, even reversing) high cholesterol, high blood pressure, type 2 diabetes, obesity, and more! An overview of the three key pillars of the Pritikin Program will be covered: eating well, doing regular exercise, and having a healthy mind-set.  WORKSHOPS  Exercise: Exercise Basics: Building Your Action Plan Clinical staff led group instruction and group discussion with PowerPoint presentation and patient guidebook. To enhance the learning environment the use of posters, models and videos may be added. At the conclusion of  this workshop, patients will comprehend the difference between physical activity and exercise, as well as the benefits of incorporating both, into their routine. Patients will understand the FITT (Frequency, Intensity, Time, and Type) principle and how to use it to build an exercise action plan. In addition, safety concerns and other considerations for exercise and cardiac rehab will be addressed by the presenter. The purpose of this lesson is to promote a comprehensive and effective weekly exercise routine in order to improve patients' overall level of fitness.   Managing Heart Disease: Your Path to a Healthier Heart Clinical staff led group instruction and group discussion  with PowerPoint presentation and patient guidebook. To enhance the learning environment the use of posters, models and videos may be added.At the conclusion of this workshop, patients will understand the anatomy and physiology of the heart. Additionally, they will understand how Pritikin's three pillars impact the risk factors, the progression, and the management of heart disease.  The purpose of this lesson is to provide a high-level overview of the heart, heart disease, and how the Pritikin lifestyle positively impacts risk factors.  Exercise Biomechanics Clinical staff led group instruction and group discussion with PowerPoint presentation and patient guidebook. To enhance the learning environment the use of posters, models and videos may be added. Patients will learn how the structural parts of their bodies function and how these functions impact their daily activities, movement, and exercise. Patients will learn how to promote a neutral spine, learn how to manage pain, and identify ways to improve their physical movement in order to promote healthy living. The purpose of this lesson is to expose patients to common physical limitations that impact physical activity. Participants will learn practical ways to adapt and  manage aches and pains, and to minimize their effect on regular exercise. Patients will learn how to maintain good posture while sitting, walking, and lifting.  Balance Training and Fall Prevention  Clinical staff led group instruction and group discussion with PowerPoint presentation and patient guidebook. To enhance the learning environment the use of posters, models and videos may be added. At the conclusion of this workshop, patients will understand the importance of their sensorimotor skills (vision, proprioception, and the vestibular system) in maintaining their ability to balance as they age. Patients will apply a variety of balancing exercises that are appropriate for their current level of function. Patients will understand the common causes for poor balance, possible solutions to these problems, and ways to modify their physical environment in order to minimize their fall risk. The purpose of this lesson is to teach patients about the importance of maintaining balance as they age and ways to minimize their risk of falling.  WORKSHOPS   Nutrition:  Fueling a Ship broker led group instruction and group discussion with PowerPoint presentation and patient guidebook. To enhance the learning environment the use of posters, models and videos may be added. Patients will review the foundational principles of the Pritikin Eating Plan and understand what constitutes a serving size in each of the food groups. Patients will also learn Pritikin-friendly foods that are better choices when away from home and review make-ahead meal and snack options. Calorie density will be reviewed and applied to three nutrition priorities: weight maintenance, weight loss, and weight gain. The purpose of this lesson is to reinforce (in a group setting) the key concepts around what patients are recommended to eat and how to apply these guidelines when away from home by planning and selecting Pritikin-friendly  options. Patients will understand how calorie density may be adjusted for different weight management goals.  Mindful Eating  Clinical staff led group instruction and group discussion with PowerPoint presentation and patient guidebook. To enhance the learning environment the use of posters, models and videos may be added. Patients will briefly review the concepts of the Pritikin Eating Plan and the importance of low-calorie dense foods. The concept of mindful eating will be introduced as well as the importance of paying attention to internal hunger signals. Triggers for non-hunger eating and techniques for dealing with triggers will be explored. The purpose of this lesson is to provide patients  with the opportunity to review the basic principles of the Pritikin Eating Plan, discuss the value of eating mindfully and how to measure internal cues of hunger and fullness using the Hunger Scale. Patients will also discuss reasons for non-hunger eating and learn strategies to use for controlling emotional eating.  Targeting Your Nutrition Priorities Clinical staff led group instruction and group discussion with PowerPoint presentation and patient guidebook. To enhance the learning environment the use of posters, models and videos may be added. Patients will learn how to determine their genetic susceptibility to disease by reviewing their family history. Patients will gain insight into the importance of diet as part of an overall healthy lifestyle in mitigating the impact of genetics and other environmental insults. The purpose of this lesson is to provide patients with the opportunity to assess their personal nutrition priorities by looking at their family history, their own health history and current risk factors. Patients will also be able to discuss ways of prioritizing and modifying the Pritikin Eating Plan for their highest risk areas  Menu  Clinical staff led group instruction and group discussion with  PowerPoint presentation and patient guidebook. To enhance the learning environment the use of posters, models and videos may be added. Using menus brought in from [employer], or printed from [employer], patients will apply the Pritikin dining out guidelines that were presented in the [employer] video. Patients will also be able to practice these guidelines in a variety of provided scenarios. The purpose of this lesson is to provide patients with the opportunity to practice hands-on learning of the Pritikin Dining Out guidelines with actual menus and practice scenarios.  Label Reading Clinical staff led group instruction and group discussion with PowerPoint presentation and patient guidebook. To enhance the learning environment the use of posters, models and videos may be added. Patients will review and discuss the Pritikin label reading guidelines presented in Pritikin's Label Reading Educational series video. Using fool labels brought in from local grocery stores and markets, patients will apply the label reading guidelines and determine if the packaged food meet the Pritikin guidelines. The purpose of this lesson is to provide patients with the opportunity to review, discuss, and practice hands-on learning of the Pritikin Label Reading guidelines with actual packaged food labels. Cooking School  Pritikin's [employer] are designed to teach patients ways to prepare quick, simple, and affordable recipes at home. The importance of nutrition's role in chronic disease risk reduction is reflected in its emphasis in the overall Pritikin program. By learning how to prepare essential core Pritikin Eating Plan recipes, patients will increase control over what they eat; be able to customize the flavor of foods without the use of added salt, sugar, or fat; and improve the quality of the food they consume. By learning a set of core recipes which are easily assembled, quickly  prepared, and affordable, patients are more likely to prepare more healthy foods at home. These workshops focus on convenient breakfasts, simple entres, side dishes, and desserts which can be prepared with minimal effort and are consistent with nutrition recommendations for cardiovascular risk reduction. Cooking [employer] are taught by a Armed forces logistics/support/administrative officer (RD) who has been trained by the [employer]. The chef or RD has a clear understanding of the importance of minimizing - if not completely eliminating - added fat, sugar, and sodium in recipes. Throughout the series of Cooking School Workshop sessions, patients will learn about healthy ingredients and efficient  methods of cooking to build confidence in their capability to prepare    Cooking School weekly topics:  Adding Flavor- Sodium-Free  Fast and Healthy Breakfasts  Powerhouse Plant-Based Proteins  Satisfying Salads and Dressings  Simple Sides and Sauces  International Cuisine-Spotlight on the [employer] Zones  Delicious Desserts  Savory Soups  [employer] - Meals in a Snap  Tasty Appetizers and Snacks  Comforting Weekend Breakfasts  One-Pot Wonders   Fast Evening Meals  Landscape architect Your Pritikin Plate  WORKSHOPS   Healthy Mindset (Psychosocial):  Focused Goals, Sustainable Changes Clinical staff led group instruction and group discussion with PowerPoint presentation and patient guidebook. To enhance the learning environment the use of posters, models and videos may be added. Patients will be able to apply effective goal setting strategies to establish at least one personal goal, and then take consistent, meaningful action toward that goal. They will learn to identify common barriers to achieving personal goals and develop strategies to overcome them. Patients will also gain an understanding of how our mind-set can impact our ability to achieve goals and the importance of  cultivating a positive and growth-oriented mind-set. The purpose of this lesson is to provide patients with a deeper understanding of how to set and achieve personal goals, as well as the tools and strategies needed to overcome common obstacles which may arise along the way.  From Head to Heart: The Power of a Healthy Outlook  Clinical staff led group instruction and group discussion with PowerPoint presentation and patient guidebook. To enhance the learning environment the use of posters, models and videos may be added. Patients will be able to recognize and describe the impact of emotions and mood on physical health. They will discover the importance of self-care and explore self-care practices which may work for them. Patients will also learn how to utilize the 4 C's to cultivate a healthier outlook and better manage stress and challenges. The purpose of this lesson is to demonstrate to patients how a healthy outlook is an essential part of maintaining good health, especially as they continue their cardiac rehab journey.  Healthy Sleep for a Healthy Heart Clinical staff led group instruction and group discussion with PowerPoint presentation and patient guidebook. To enhance the learning environment the use of posters, models and videos may be added. At the conclusion of this workshop, patients will be able to demonstrate knowledge of the importance of sleep to overall health, well-being, and quality of life. They will understand the symptoms of, and treatments for, common sleep disorders. Patients will also be able to identify daytime and nighttime behaviors which impact sleep, and they will be able to apply these tools to help manage sleep-related challenges. The purpose of this lesson is to provide patients with a general overview of sleep and outline the importance of quality sleep. Patients will learn about a few of the most common sleep disorders. Patients will also be introduced to the concept of  "sleep hygiene," and discover ways to self-manage certain sleeping problems through simple daily behavior changes. Finally, the workshop will motivate patients by clarifying the links between quality sleep and their goals of heart-healthy living.   Recognizing and Reducing Stress Clinical staff led group instruction and group discussion with PowerPoint presentation and patient guidebook. To enhance the learning environment the use of posters, models and videos may be added. At the conclusion of this workshop, patients will be able to understand the types of stress reactions, differentiate between acute and chronic  stress, and recognize the impact that chronic stress has on their health. They will also be able to apply different coping mechanisms, such as reframing negative self-talk. Patients will have the opportunity to practice a variety of stress management techniques, such as deep abdominal breathing, progressive muscle relaxation, and/or guided imagery.  The purpose of this lesson is to educate patients on the role of stress in their lives and to provide healthy techniques for coping with it.  Learning Barriers/Preferences:  Learning Barriers/Preferences - 08/11/23 1433       Learning Barriers/Preferences   Learning Barriers Sight   reading glasses   Learning Preferences Audio;Computer/Internet;Group Instruction;Individual Instruction;Pictoral;Skilled Demonstration;Verbal Instruction;Video;Written Material          Education Topics:  Knowledge Questionnaire Score:  Knowledge Questionnaire Score - 08/11/23 1433       Knowledge Questionnaire Score   Pre Score 21/24          Core Components/Risk Factors/Patient Goals at Admission:  Personal Goals and Risk Factors at Admission - 08/11/23 1433       Core Components/Risk Factors/Patient Goals on Admission    Weight Management Yes;Obesity;Weight Loss    Intervention Weight Management: Develop a combined nutrition and exercise  program designed to reach desired caloric intake, while maintaining appropriate intake of nutrient and fiber, sodium and fats, and appropriate energy expenditure required for the weight goal.;Weight Management: Provide education and appropriate resources to help participant work on and attain dietary goals.;Weight Management/Obesity: Establish reasonable short term and long term weight goals.;Obesity: Provide education and appropriate resources to help participant work on and attain dietary goals.    Expected Outcomes Short Term: Continue to assess and modify interventions until short term weight is achieved;Long Term: Adherence to nutrition and physical activity/exercise program aimed toward attainment of established weight goal;Weight Loss: Understanding of general recommendations for a balanced deficit meal plan, which promotes 1-2 lb weight loss per week and includes a negative energy balance of [phone] kcal/d;Understanding recommendations for meals to include 15-35% energy as protein, 25-35% energy from fat, 35-60% energy from carbohydrates, less than 200mg  of dietary cholesterol, 20-35 gm of total fiber daily;Understanding of distribution of calorie intake throughout the day with the consumption of 4-5 meals/snacks    Diabetes Yes    Intervention Provide education about signs/symptoms and action to take for hypo/hyperglycemia.;Provide education about proper nutrition, including hydration, and aerobic/resistive exercise prescription along with prescribed medications to achieve blood glucose in normal ranges: Fasting glucose 65-99 mg/dL    Expected Outcomes Short Term: Participant verbalizes understanding of the signs/symptoms and immediate care of hyper/hypoglycemia, proper foot care and importance of medication, aerobic/resistive exercise and nutrition plan for blood glucose control.;Long Term: Attainment of HbA1C < 7%.    Hypertension Yes    Intervention Provide education on lifestyle modifcations  including regular physical activity/exercise, weight management, moderate sodium restriction and increased consumption of fresh fruit, vegetables, and low fat dairy, alcohol moderation, and smoking cessation.;Monitor prescription use compliance.    Expected Outcomes Short Term: Continued assessment and intervention until BP is < 140/34mm HG in hypertensive participants. < 130/28mm HG in hypertensive participants with diabetes, heart failure or chronic kidney disease.;Long Term: Maintenance of blood pressure at goal levels.    Lipids Yes    Intervention Provide education and support for participant on nutrition & aerobic/resistive exercise along with prescribed medications to achieve LDL 70mg , HDL >40mg .    Expected Outcomes Short Term: Participant states understanding of desired cholesterol values and is compliant with medications prescribed. Participant is  following exercise prescription and nutrition guidelines.;Long Term: Cholesterol controlled with medications as prescribed, with individualized exercise RX and with personalized nutrition plan. Value goals: LDL < 70mg , HDL > 40 mg.    Stress Yes    Intervention Offer individual and/or small group education and counseling on adjustment to heart disease, stress management and health-related lifestyle change. Teach and support self-help strategies.;Refer participants experiencing significant psychosocial distress to appropriate mental health specialists for further evaluation and treatment. When possible, include family members and significant others in education/counseling sessions.    Expected Outcomes Short Term: Participant demonstrates changes in health-related behavior, relaxation and other stress management skills, ability to obtain effective social support, and compliance with psychotropic medications if prescribed.;Long Term: Emotional wellbeing is indicated by absence of clinically significant psychosocial distress or social isolation.           Core Components/Risk Factors/Patient Goals Review:   Goals and Risk Factor Review     Row Name 08/18/23 9177 09/09/23 9078 10/11/23 1009 11/04/23 0938 11/29/23 1451     Core Components/Risk Factors/Patient Goals Review   Personal Goals Review Weight Management/Obesity;Hypertension;Lipids;Diabetes Weight Management/Obesity;Hypertension;Lipids;Diabetes Weight Management/Obesity;Hypertension;Lipids;Diabetes Weight Management/Obesity;Hypertension;Lipids;Diabetes Weight Management/Obesity;Hypertension;Lipids;Diabetes   Review Rosaline started cardiac rehab on 08/17/23. Rosaline did well with exercise for her fitness level. Rosaline is somewhat deconditioned and uses a rollator for stability. Moderate exertional systolic Bp's noted. Will continue to monitor BP Rosaline started cardiac rehab on 08/17/23. Rosaline did well with exercise for her fitness level. Rosaline is somewhat deconditioned and uses a rollator for stability. Systolic BP's noted to be in the 150's at cardiac rehab resting. Vital signs have been forwarded to Dr Delford for review. Rosaline continues to do  fair with  exercise for her fitness level. Michelle's vital signs and CBG's have been stable. Rosaline has gained 1 kg since starting cardiac rehab Rosaline continues to do  fair with  exercise for her fitness level. Michelle's vital signs and CBG's remain stable. Rosaline has gained 1.4  kg since starting cardiac rehab Rosaline continues to do  fair with  exercise for her fitness level. Michelle's vital signs and CBG's remain stable. Rosaline will complete cardiac rehab tenatively on 12/07/23.   Expected Outcomes Rosaline will continue to participate in cardiac rehab for exercise, nutrition and lifestyle modifications Rosaline will continue to participate in cardiac rehab for exercise, nutrition and lifestyle modifications Rosaline will continue to participate in cardiac rehab for exercise, nutrition and lifestyle modifications Rosaline  will continue to participate in cardiac rehab for exercise, nutrition and lifestyle modifications Rosaline will continue to participate in cardiac rehab for exercise, nutrition and lifestyle modifications      Core Components/Risk Factors/Patient Goals at Discharge (Final Review):   Goals and Risk Factor Review - 11/29/23 1451       Core Components/Risk Factors/Patient Goals Review   Personal Goals Review Weight Management/Obesity;Hypertension;Lipids;Diabetes    Review Rosaline continues to do  fair with  exercise for her fitness level. Michelle's vital signs and CBG's remain stable. Rosaline will complete cardiac rehab tenatively on 12/07/23.    Expected Outcomes Rosaline will continue to participate in cardiac rehab for exercise, nutrition and lifestyle modifications          ITP Comments:  ITP Comments     Row Name 08/11/23 1021 08/18/23 0812 08/23/23 0759 09/09/23 0824 10/11/23 1006   ITP Comments Wilbert Holland, MD:  Medical Director.  Introduction to the [facility]/ Intensive Cardiac Rehab. Initial oreintation packet reviewed with the patient. 30 Day ITP Review.  Rosaline started cardiac rehab on 08/17/23. Rosaline did well with exercise for her fitness level 30 Day ITP Review. Rosaline started cardiac rehab on 08/17/23. Rosaline is off to a good start  with exercise for her fitness level 30 Day ITP Review. Rosaline has good participation with exercise at cardiac rehab when in attendance 30 Day ITP Review. Rosaline continues to have good  participation with exercise at cardiac rehab when in attendance    Row Name 11/04/23 0937 11/29/23 1448         ITP Comments 30 Day ITP Review. Rosaline continues to have good attendance and  participation with exercise at cardiac rehab. 30 Day ITP Review. Rosaline continues to have good attendance and  participation with exercise at cardiac rehab. Rosaline will complete cardiac rehab tenatively on 12/07/23.         Comments: See  ITP Comments

## 2023-12-07 ENCOUNTER — Encounter (HOSPITAL_COMMUNITY)
Admission: RE | Admit: 2023-12-07 | Discharge: 2023-12-07 | Disposition: A | Discharge status: Home / Self Care | Source: Ambulatory Visit | Attending: Cardiovascular Disease | Admitting: Cardiovascular Disease

## 2023-12-07 VITALS — BP 116/78 | HR 77 | Ht <= 58 in | Wt 202.8 lb

## 2023-12-07 DIAGNOSIS — Z952 Presence of prosthetic heart valve: Secondary | ICD-10-CM | POA: Diagnosis not present

## 2023-12-07 NOTE — Progress Notes (Signed)
 Discharge Progress Report  Patient Details  Name: Olivia  MELI [name] MRN: [id number] Date of Birth: [birth year] Referring Provider:   Flowsheet Row INTENSIVE CARDIAC REHAB ORIENT from 08/11/2023 in [facility]  Referring Provider Maude Emmer, MD     Number of Visits: 9  Reason for Discharge:  Patient reached a stable level of exercise. Patient has met program and personal goals.  Smoking History:  Social History   Tobacco Use  Smoking Status Never  Smokeless Tobacco Never    Diagnosis:  06/14/23 S/P AVR (aortic valve replacement)  ADL UCSD:   Initial Exercise Prescription:  Initial Exercise Prescription - 08/11/23 1400       Date of Initial Exercise RX and Referring Provider   Date 08/11/23    Referring Provider Maude Emmer, MD    Expected Discharge Date 11/02/23      NuStep   Level 3    SPM 75    Minutes 25    METs 2.1      Prescription Details   Frequency (times per week) 3    Duration Progress to 30 minutes of continuous aerobic without signs/symptoms of physical distress      Intensity   THRR 40-80% of Max Heartrate 62-123    Ratings of Perceived Exertion 11-13    Perceived Dyspnea 0-4      Progression   Progression Continue progressive overload as per policy without signs/symptoms or physical distress.      Resistance Training   Training Prescription Yes    Weight 2 lbs    Reps 10-15          Discharge Exercise Prescription (Final Exercise Prescription Changes):  Exercise Prescription Changes - 12/07/23 1030       Response to Exercise   Blood Pressure (Admit) 116/78    Blood Pressure (Exit) 108/70    Heart Rate (Admit) 77 bpm    Heart Rate (Exercise) 90 bpm    Heart Rate (Exit) 77 bpm    Rating of Perceived Exertion (Exercise) 12    Symptoms None    Comments Rosaline finished the cardiac rehab program today.    Duration Continue with 30 min of aerobic exercise without  signs/symptoms of physical distress.    Intensity THRR unchanged      Progression   Progression Continue to progress workloads to maintain intensity without signs/symptoms of physical distress.    Average METs 1.5      Resistance Training   Training Prescription No    Weight Relaxation day. No weights.      Interval Training   Interval Training No      NuStep   Level 6    SPM 106    Minutes 15    METs 1.6      Arm Ergometer   Level 1    Watts 6    Minutes 15    METs 1.4      Home Exercise Plan   Plans to continue exercise at Home (comment)   Walking   Frequency Add 2 additional days to program exercise sessions.    Initial Home Exercises Provided 09/26/23          Functional Capacity:  6 Minute Walk     Row Name 08/11/23 1419 11/30/23 1047       6 Minute Walk   Phase Initial  Pt used her rollator Discharge    Distance 25.53 feet 1080 feet    Distance %  Change -- 12.5 %    Distance Feet Change -- 120 ft    Walk Time 6 minutes 6 minutes    # of Rest Breaks 1  Pt stopped at 5:40, 20 seconds early 0    MPH 1.82 2.04    METS 2.15 2.14    RPE 13 11    Perceived Dyspnea  1 1    VO2 Peak 7.53 7.5    Symptoms Yes (comment) Yes (comment)    Comments 5/10 right knee pain Mild shorthness of breath, 5/10 right leg pain, chronic.    Resting HR 83 bpm 78 bpm    Resting BP 132/70 110/66    Resting Oxygen Saturation  98 % --    Exercise Oxygen Saturation  during 6 min walk 99 % 100 %    Max Ex. HR 105 bpm 112 bpm    Max Ex. BP 180/90 142/70    2 Minute Post BP 134/80 110/62       Psychological, QOL, Others - Outcomes: PHQ 2/9:    12/07/2023   11:52 AM 11/01/2023   10:47 AM 08/11/2023    2:36 PM 08/09/2023   10:50 AM 04/26/2023   10:19 AM  Depression screen PHQ 2/9  Decreased Interest 0 0 0 0 0  Down, Depressed, Hopeless 0 0 0 0 0  PHQ - 2 Score 0 0 0 0 0  Altered sleeping 0 0 0    Tired, decreased energy 1 0 1    Change in appetite 1 0 0    Feeling bad or  failure about yourself  0 0 0    Trouble concentrating 0 0 0    Moving slowly or fidgety/restless 0 0 0    Suicidal thoughts 0 0 0    PHQ-9 Score 2 0 1    Difficult doing work/chores Somewhat difficult Not difficult at all Not difficult at all      Quality of Life:  Quality of Life - 08/11/23 1432       Quality of Life   Select Quality of Life      Quality of Life Scores   Health/Function Pre 25.53 %    Socioeconomic Pre 24.75 %    Psych/Spiritual Pre 25.29 %    Family Pre 27.3 %    GLOBAL Pre 25.61 %          Personal Goals: Goals established at orientation with interventions provided to work toward goal.  Personal Goals and Risk Factors at Admission - 08/11/23 1433       Core Components/Risk Factors/Patient Goals on Admission    Weight Management Yes;Obesity;Weight Loss    Intervention Weight Management: Develop a combined nutrition and exercise program designed to reach desired caloric intake, while maintaining appropriate intake of nutrient and fiber, sodium and fats, and appropriate energy expenditure required for the weight goal.;Weight Management: Provide education and appropriate resources to help participant work on and attain dietary goals.;Weight Management/Obesity: Establish reasonable short term and long term weight goals.;Obesity: Provide education and appropriate resources to help participant work on and attain dietary goals.    Expected Outcomes Short Term: Continue to assess and modify interventions until short term weight is achieved;Long Term: Adherence to nutrition and physical activity/exercise program aimed toward attainment of established weight goal;Weight Loss: Understanding of general recommendations for a balanced deficit meal plan, which promotes 1-2 lb weight loss per week and includes a negative energy balance of [phone] kcal/d;Understanding recommendations for meals to include 15-35% energy as  protein, 25-35% energy from fat, 35-60% energy from  carbohydrates, less than 200mg  of dietary cholesterol, 20-35 gm of total fiber daily;Understanding of distribution of calorie intake throughout the day with the consumption of 4-5 meals/snacks    Diabetes Yes    Intervention Provide education about signs/symptoms and action to take for hypo/hyperglycemia.;Provide education about proper nutrition, including hydration, and aerobic/resistive exercise prescription along with prescribed medications to achieve blood glucose in normal ranges: Fasting glucose 65-99 mg/dL    Expected Outcomes Short Term: Participant verbalizes understanding of the signs/symptoms and immediate care of hyper/hypoglycemia, proper foot care and importance of medication, aerobic/resistive exercise and nutrition plan for blood glucose control.;Long Term: Attainment of HbA1C < 7%.    Hypertension Yes    Intervention Provide education on lifestyle modifcations including regular physical activity/exercise, weight management, moderate sodium restriction and increased consumption of fresh fruit, vegetables, and low fat dairy, alcohol moderation, and smoking cessation.;Monitor prescription use compliance.    Expected Outcomes Short Term: Continued assessment and intervention until BP is < 140/56mm HG in hypertensive participants. < 130/74mm HG in hypertensive participants with diabetes, heart failure or chronic kidney disease.;Long Term: Maintenance of blood pressure at goal levels.    Lipids Yes    Intervention Provide education and support for participant on nutrition & aerobic/resistive exercise along with prescribed medications to achieve LDL 70mg , HDL >40mg .    Expected Outcomes Short Term: Participant states understanding of desired cholesterol values and is compliant with medications prescribed. Participant is following exercise prescription and nutrition guidelines.;Long Term: Cholesterol controlled with medications as prescribed, with individualized exercise RX and with personalized  nutrition plan. Value goals: LDL < 70mg , HDL > 40 mg.    Stress Yes    Intervention Offer individual and/or small group education and counseling on adjustment to heart disease, stress management and health-related lifestyle change. Teach and support self-help strategies.;Refer participants experiencing significant psychosocial distress to appropriate mental health specialists for further evaluation and treatment. When possible, include family members and significant others in education/counseling sessions.    Expected Outcomes Short Term: Participant demonstrates changes in health-related behavior, relaxation and other stress management skills, ability to obtain effective social support, and compliance with psychotropic medications if prescribed.;Long Term: Emotional wellbeing is indicated by absence of clinically significant psychosocial distress or social isolation.           Personal Goals Discharge:  Goals and Risk Factor Review     Row Name 08/18/23 9177 09/09/23 9078 10/11/23 1009 11/04/23 0938 11/29/23 1451     Core Components/Risk Factors/Patient Goals Review   Personal Goals Review Weight Management/Obesity;Hypertension;Lipids;Diabetes Weight Management/Obesity;Hypertension;Lipids;Diabetes Weight Management/Obesity;Hypertension;Lipids;Diabetes Weight Management/Obesity;Hypertension;Lipids;Diabetes Weight Management/Obesity;Hypertension;Lipids;Diabetes   Review Rosaline started cardiac rehab on 08/17/23. Rosaline did well with exercise for her fitness level. Rosaline is somewhat deconditioned and uses a rollator for stability. Moderate exertional systolic Bp's noted. Will continue to monitor BP Rosaline started cardiac rehab on 08/17/23. Rosaline did well with exercise for her fitness level. Rosaline is somewhat deconditioned and uses a rollator for stability. Systolic BP's noted to be in the 150's at cardiac rehab resting. Vital signs have been forwarded to Dr Delford for review. Rosaline  continues to do  fair with  exercise for her fitness level. Michelle's vital signs and CBG's have been stable. Rosaline has gained 1 kg since starting cardiac rehab Rosaline continues to do  fair with  exercise for her fitness level. Michelle's vital signs and CBG's remain stable. Rosaline has gained 1.4  kg since starting cardiac rehab [place]  continues to do  fair with  exercise for her fitness level. Michelle's vital signs and CBG's remain stable. Rosaline will complete cardiac rehab tenatively on 12/07/23.   Expected Outcomes Rosaline will continue to participate in cardiac rehab for exercise, nutrition and lifestyle modifications Rosaline will continue to participate in cardiac rehab for exercise, nutrition and lifestyle modifications Rosaline will continue to participate in cardiac rehab for exercise, nutrition and lifestyle modifications Rosaline will continue to participate in cardiac rehab for exercise, nutrition and lifestyle modifications Rosaline will continue to participate in cardiac rehab for exercise, nutrition and lifestyle modifications      Exercise Goals and Review:  Exercise Goals     Row Name 08/11/23 1422             Exercise Goals   Increase Physical Activity Yes       Intervention Provide advice, education, support and counseling about physical activity/exercise needs.;Develop an individualized exercise prescription for aerobic and resistive training based on initial evaluation findings, risk stratification, comorbidities and participant's personal goals.       Expected Outcomes Short Term: Attend rehab on a regular basis to increase amount of physical activity.;Long Term: Exercising regularly at least 3-5 days a week.;Long Term: Add in home exercise to make exercise part of routine and to increase amount of physical activity.       Increase Strength and Stamina Yes       Intervention Provide advice, education, support and counseling about physical activity/exercise  needs.;Develop an individualized exercise prescription for aerobic and resistive training based on initial evaluation findings, risk stratification, comorbidities and participant's personal goals.       Expected Outcomes Short Term: Increase workloads from initial exercise prescription for resistance, speed, and METs.;Short Term: Perform resistance training exercises routinely during rehab and add in resistance training at home;Long Term: Improve cardiorespiratory fitness, muscular endurance and strength as measured by increased METs and functional capacity ( )       Able to understand and use rate of perceived exertion (RPE) scale Yes       Intervention Provide education and explanation on how to use RPE scale       Expected Outcomes Short Term: Able to use RPE daily in rehab to express subjective intensity level;Long Term:  Able to use RPE to guide intensity level when exercising independently       Knowledge and understanding of Target Heart Rate Range (THRR) Yes       Intervention Provide education and explanation of THRR including how the numbers were predicted and where they are located for reference       Expected Outcomes Short Term: Able to state/look up THRR;Long Term: Able to use THRR to govern intensity when exercising independently;Short Term: Able to use daily as guideline for intensity in rehab       Understanding of Exercise Prescription Yes       Intervention Provide education, explanation, and written materials on patient's individual exercise prescription       Expected Outcomes Short Term: Able to explain program exercise prescription;Long Term: Able to explain home exercise prescription to exercise independently          Exercise Goals Re-Evaluation:  Exercise Goals Re-Evaluation     Row Name 08/17/23 1130 09/19/23 1117 09/26/23 1100 10/24/23 1120 11/07/23 1116     Exercise Goal Re-Evaluation   Exercise Goals Review Increase Physical Activity;Increase Strength and  Stamina;Able to understand and use rate of perceived exertion (RPE) scale Increase Physical Activity;Increase Strength  and Stamina;Able to understand and use rate of perceived exertion (RPE) scale Increase Physical Activity;Increase Strength and Stamina;Able to understand and use rate of perceived exertion (RPE) scale;Knowledge and understanding of Target Heart Rate Range (THRR);Understanding of Exercise Prescription Increase Physical Activity;Increase Strength and Stamina;Able to understand and use rate of perceived exertion (RPE) scale;Knowledge and understanding of Target Heart Rate Range (THRR);Understanding of Exercise Prescription Increase Physical Activity;Increase Strength and Stamina;Able to understand and use rate of perceived exertion (RPE) scale;Knowledge and understanding of Target Heart Rate Range (THRR);Understanding of Exercise Prescription   Comments [place]  was able to understand and use RPE scale appropriately. Rosaline is walking 20 minutes 2 days/week in addition to exercise at cardiac rehab. Reviewed exercise prescription with Rosaline. She is walking 20 minutes 2 days/week but is limited by knee and leg pain. She will look into seated exercise that she can do via video like Youtube. Her goal is to lose weight. Rosaline is making gradual progress with exercise. She feels the exercise helps, and she's walking 15-20 minutes 2 days/week as tolerated. She had back surgery in the past, and she is limited by chronic back pain. She is still working on her goal of losing weight. She likes doing the arm ergometer in addition to the recumbent stepper. Rosaline continues to make gradual progress with exercise. She feels the arm ergometer is helping a little with upper body strength.   Expected Outcomes Progress workloads as tolerated to help increase cardiorespiratory fitness. Continue walking at home in addition to exercise at cardiac rehab to help increase strength and stamina. Continue walking and  increase workloads as tolerated. Continue to progress workloads as tolerated to help achieve personal health and fitness goals. Continue to progress workloads as tolerated.    Row Name 11/28/23 1052 11/30/23 1100 12/07/23 1135         Exercise Goal Re-Evaluation   Exercise Goals Review Increase Physical Activity;Increase Strength and Stamina;Able to understand and use rate of perceived exertion (RPE) scale;Knowledge and understanding of Target Heart Rate Range (THRR);Understanding of Exercise Prescription Increase Physical Activity;Increase Strength and Stamina;Able to understand and use rate of perceived exertion (RPE) scale;Knowledge and understanding of Target Heart Rate Range (THRR);Understanding of Exercise Prescription Increase Physical Activity;Increase Strength and Stamina;Able to understand and use rate of perceived exertion (RPE) scale;Knowledge and understanding of Target Heart Rate Range (THRR);Understanding of Exercise Prescription     Comments Rosaline with complete cardiac rehab next week. She plans to continue exercise at the [facility]: 30 minutes, 3 days/week. Michelle's functional capacity increased 12% as measured by and strength increased 29% as measured by grip strength test. Her balance improved 1.63 seconds. Gave her links to seated aerobics videos that she can do at home in addition to exercise at the [facility]. Rosaline completed the cardiac rehab program today. She will continue exercise at home and at the [facility] at this time.     Expected Outcomes Rosaline locke continue exercise at [facility] upon completion of the cardiac rehab program. Rosaline locke continue exercise at [facility] and seated exercises at home upon completion of the cardiac rehab program. Rosaline locke continue exercise at [facility] and seated exercises at home to maintain health and fitness benefits.        Nutrition & Weight - Outcomes:   Pre Biometrics - 08/11/23 1105       Pre Biometrics   Waist Circumference 47 inches    Hip Circumference 51.25 inches  Waist to Hip Ratio 0.92 %    Triceps Skinfold 33 mm    % Body Fat 53.3 %    Grip Strength 17 kg    Flexibility --   Not performed, hx of back surgery x 4   Single Leg Stand 5.57 seconds          Post Biometrics - 12/07/23 1057        Post  Biometrics   Height 4' 10 (1.473 m)    Waist Circumference 47.5 inches    Hip Circumference 51 inches    Waist to Hip Ratio 0.93 %    BMI (Calculated) 42.4    Triceps Skinfold 39 mm    % Body Fat 54.8 %    Grip Strength 22 kg    Flexibility --   Not performed, hx of back surgery x 4   Single Leg Stand 7.25 seconds          Nutrition:  Nutrition Therapy & Goals - 12/07/23 1139       Nutrition Therapy   Diet Heart Healthy Diet    Drug/Food Interactions Statins/Certain Fruits      Personal Nutrition Goals   Nutrition Goal Patient to identify strategies for reducing cardiovascular risk by attending the [facility] education and nutrition series weekly.   goal met.   Personal Goal #2 Patient to improve diet quality by using the plate method as a guide for meal planning to include lean protein/plant protein, fruits, vegetables, whole grains, nonfat dairy as part of a well-balanced diet.   goal in progress.   Personal Goal #3 Patient to identify strategies for weight loss of 0.5-2.0# per week.   goal not met.   Comments Goals in progress. Rowene  has medical history of s/p AVR, chronic diastolic heart failure, PAF, HTN, DM2. LDL has improved but is not at goal (zetia , crestor ). Her A1c has improved on increased dose of Ozempic  (2mg ) and remains in a prediabetic range; per documentation, may switch to wegovy . She is motivated to lose weight; however, she is up 3.3# since starting with our program; she continues follow-up with [facility] (last visit 10/10/23) and is considering weight loss surgery.  She has  attended the[facility] education and nutrition series regularly. Patient will benefit from  adherence to nutrition, exercise, and lifestyle modification recommendations.      Intervention Plan   Intervention Prescribe, educate and counsel regarding individualized specific dietary modifications aiming towards targeted core components such as weight, hypertension, lipid management, diabetes, heart failure and other comorbidities.;Nutrition handout(s) given to patient.    Expected Outcomes Short Term Goal: Understand basic principles of dietary content, such as calories, fat, sodium, cholesterol and nutrients.;Long Term Goal: Adherence to prescribed nutrition plan.          Nutrition Discharge:   Education Questionnaire Score:  Knowledge Questionnaire Score - 08/11/23 1433       Knowledge Questionnaire Score   Pre Score 21/24          Goals reviewed with patient; copy given to patient.Pt graduated from  Intensive/Traditional cardiac rehab program on 12/07/23 with completion of  31 exercise and  22 education sessions. Pt maintained good attendance and progressed nicely during their participation in rehab as evidenced by increased MET level. Rosaline increased her distance on her post exercise walk test by 120 feet. Rosaline gained 1.2 kg while enrolled in the program.    Medication list reconciled. Repeat  PHQ score- 2 . Rosaline  achieved her  goals during  cardiac rehab.   Pt plans to continue exercise at the [facility]. We are proud of Michelle's progress!Hadassah Elpidio Quan RN BSN

## 2023-12-15 ENCOUNTER — Ambulatory Visit: Admitting: Infectious Diseases

## 2023-12-19 ENCOUNTER — Other Ambulatory Visit (HOSPITAL_COMMUNITY): Payer: Self-pay

## 2023-12-29 ENCOUNTER — Other Ambulatory Visit: Payer: Self-pay

## 2023-12-29 ENCOUNTER — Ambulatory Visit: Admitting: Infectious Diseases

## 2023-12-29 DIAGNOSIS — Z6841 Body Mass Index (BMI) 40.0 and over, adult: Secondary | ICD-10-CM

## 2023-12-29 DIAGNOSIS — Z79899 Other long term (current) drug therapy: Secondary | ICD-10-CM

## 2023-12-29 DIAGNOSIS — B9689 Other specified bacterial agents as the cause of diseases classified elsewhere: Secondary | ICD-10-CM | POA: Diagnosis not present

## 2023-12-29 DIAGNOSIS — A498 Other bacterial infections of unspecified site: Secondary | ICD-10-CM

## 2023-12-29 DIAGNOSIS — T847XXD Infection and inflammatory reaction due to other internal orthopedic prosthetic devices, implants and grafts, subsequent encounter: Secondary | ICD-10-CM | POA: Diagnosis not present

## 2023-12-29 DIAGNOSIS — Z952 Presence of prosthetic heart valve: Secondary | ICD-10-CM

## 2023-12-29 DIAGNOSIS — M4626 Osteomyelitis of vertebra, lumbar region: Secondary | ICD-10-CM

## 2023-12-29 DIAGNOSIS — T847XXA Infection and inflammatory reaction due to other internal orthopedic prosthetic devices, implants and grafts, initial encounter: Secondary | ICD-10-CM | POA: Insufficient documentation

## 2023-12-29 NOTE — Progress Notes (Addendum)
 Patient Active Problem List   Diagnosis Date Noted   S/P AVR (aortic valve replacement) 06/30/2023   Morbid obesity (HCC) 06/29/2023   Dyspnea 06/28/2023   AKI (acute kidney injury) (HCC) 06/19/2023   Complete heart block (HCC) 06/17/2023   Hyperglycemia 06/17/2023   Fever 06/17/2023   S/P AVR (aortic valve replacement) and aortoplasty 06/14/2023   Acute CHF (congestive heart failure) (HCC) 04/28/2023   Acute on chronic heart failure with preserved ejection fraction (HCC) 04/27/2023   Severe aortic stenosis    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   Microcytic anemia 01/25/2023   Class 2 severe obesity due to excess calories with serious comorbidity and body mass index (BMI) of 38.0 to 38.9 in adult [facility]) 07/08/2022   Primary osteoarthritis of right knee 07/08/2022   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   Bacteremia due to Proteus species    Sepsis (HCC) 02/03/2022   Iron  deficiency anemia 02/03/2022   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   S/P lumbar spinal fusion 11/17/2021   Lumbar radiculopathy 04/21/2021   Type 2 diabetes mellitus without complication, without long-term current use of insulin  (HCC) 12/22/2018   Hypertriglyceridemia 10/19/2018   Spondylolisthesis of lumbar region 02/01/2018   Gastroesophageal reflux disease without esophagitis 01/16/2018   Skin inflammation 08/25/2017   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   ALBUTEROL  (PROAIR  HFA) 108 (90 BASE)  MCG/ACT INHALER    Inhale 2 puffs into the lungs every 6 (six) hours as needed.   AMIODARONE  (PACERONE ) 200 MG TABLET    Take 1 tablet (200 mg total) by mouth daily.   AMLODIPINE  (NORVASC ) 10 MG TABLET    Take 1 tablet (10 mg total) by mouth daily.   AMOXICILLIN  (AMOXIL ) 500 MG TABLET    Take 4 tablets (2000 mg) by mouth one hour prior to dental procedure.   APIXABAN  (ELIQUIS ) 5 MG TABS TABLET    Take 1 tablet (5 mg total) by mouth 2 (two) times daily.   ASCORBIC ACID (VITAMIN C) 500 MG TABLET    Take 500 mg by mouth daily.   ASPIRIN  EC 81 MG TABLET    Take 1 tablet (81 mg total) by mouth daily. Swallow whole.   CELECOXIB  (CELEBREX ) 200 MG CAPSULE    Take by mouth 2 (two) times daily.   CETIRIZINE  (ZYRTEC ) 10 MG TABLET    Take 1 tablet (10 mg total) by mouth daily.   DOCUSATE SODIUM  (COLACE) 100 MG CAPSULE    Take 200-300 mg by mouth at bedtime.   EZETIMIBE  (ZETIA ) 10 MG TABLET    Take 1 tablet (10 mg total) by mouth daily.   FLUTICASONE  (FLONASE ) 50 MCG/ACT NASAL SPRAY    Place 2 sprays into both nostrils daily.   FUROSEMIDE  (LASIX ) 40 MG TABLET    Take 1 tablet (40 mg total) by mouth daily as needed for edema or fluid (shortness of breath).   GABAPENTIN  (NEURONTIN ) 600 MG TABLET    Take 600 mg  by mouth 3 (three) times daily.   IRON  POLYSACCHARIDES (NIFEREX) 150 MG CAPSULE    TAKE 1 CAPSULE (150 MG DOSE) BY MOUTH TWICE A DAY   LOSARTAN  (COZAAR ) 100 MG TABLET    Take 1 tablet (100 mg total) by mouth daily.   METFORMIN  (GLUCOPHAGE ) 1000 MG TABLET    Take 1 tablet (1,000 mg total) by mouth 2 (two) times daily with a meal.   METOPROLOL  TARTRATE (LOPRESSOR ) 25 MG TABLET    Take 1 tablet (25 mg total) by mouth 2 (two) times daily.   ONDANSETRON  (ZOFRAN ) 8 MG TABLET    Take 8 mg by mouth 3 (three) times daily.   PANTOPRAZOLE  (PROTONIX ) 40 MG TABLET    Take 40 mg by mouth daily.   POLYVINYL ALCOHOL (LIQUIFILM TEARS) 1.4 % OPHTHALMIC SOLUTION    Place 1 drop into both eyes as needed for dry eyes.    ROSUVASTATIN  (CRESTOR ) 10 MG TABLET    Take 1 tablet (10 mg total) by mouth every other day.   SEMAGLUTIDE , 2 MG/DOSE, (OZEMPIC , 2 MG/DOSE,) 8 MG/3ML SOPN    Inject 2 mg into the skin once a week.   SULFAMETHOXAZOLE -TRIMETHOPRIM  (BACTRIM ) 400-80 MG TABLET    TAKE 1 TABLET BY MOUTH TWICE DAILY   VEOZAH 45 MG TABS    Take 1 tablet by mouth daily.  Modified Medications   No medications on file  Discontinued Medications   No medications on file    Subjective: Discussed the use of AI scribe software for clinical note transcription with the patient, who gave verbal consent to proceed.   68 Y O Female with prior h/o Allergy/asthma, anxiety, arthritis, GERD, DM, HTN, Morbid Obesity, HLD, severe AS s/p ascending aortic root replacement in 06/14/2023 who is here for follow up in the context of post surgical lumbar surgical site infection with osteomyelitis complicated with hardware and proteus mirabilis BSI s/p I and D with all old hardware removed and new screws placed on 02/05/22.  Completed course of 8 weeks iv ceftriaxone  on 04/12/22 then transitioned to Bactrim  ss bid as cefazolin  MIC was 8.  Bactrim  for suppression was stopped last visit 7/3. 7/1 MRI with no signs of vertebral infection. It was unclear regarding elevated CRPs in the range of 30s. Followed by Dr Overton and was last seen 11/24/23, notes reviewed.   8/7 She has chronic pain in her rt thigh, nerve related for many years that she does not count. Denies back pain. She used cane to walk.  She cannot tell any changes in her condition since stopping bactrim  since last visit. Denies fevers, chills, nausea, vomiting, or diarrhea. No complaints today.   Review of Systems: all systems reviewed with pertinent positives and negatives as listed above.   Past Medical History:  Diagnosis Date   Allergy    Anemia    Anxiety    Arthritis    Asthma    BMI 40.0-44.9, adult (HCC) 04/07/2014   DM type 2 (diabetes mellitus, type 2) (HCC)    GERD  (gastroesophageal reflux disease)    HTN (hypertension)    Hypercholesterolemia    Osteoarthritis of left hip 04/07/2014   Reflux    Severe aortic stenosis    Spinal headache    with C-Section and with spinal fusion in 2019   Past Surgical History:  Procedure Laterality Date   ASCENDING AORTIC ROOT REPLACEMENT N/A 06/14/2023   Procedure: ASCENDING AORTIC ROOT REPLACEMENT USING KONECT RESILIA AORTIC VALVE CONDUIT SIZE AND REATTACHMENT OF  RIGHT AND LEFT CORONARY;  Surgeon: Maryjane Mt, MD;  Location: [facility] OR;  Service: Open Heart Surgery;  Laterality: N/A;   BACK SURGERY  2023   lumbar fusion, got infection, then had to re-do fusion   CESAREAN SECTION     x3   COLONOSCOPY     LAMINECTOMY  03/2021   LEFT HEART CATH AND CORONARY ANGIOGRAPHY N/A 04/06/2023   Procedure: LEFT HEART CATH AND CORONARY ANGIOGRAPHY;  Surgeon: Wonda Sharper, MD;  Location: [facility] INVASIVE CV LAB;  Service: Cardiovascular;  Laterality: N/A;   SPINAL FUSION  2019   TEE WITHOUT CARDIOVERSION N/A 06/14/2023   Procedure: TRANSESOPHAGEAL ECHOCARDIOGRAM (TEE);  Surgeon: Maryjane Mt, MD;  Location: [facility] OR;  Service: Open Heart Surgery;  Laterality: N/A;   TUBAL LIGATION      Social History   Tobacco Use   Smoking status: Never   Smokeless tobacco: Never  Vaping Use   Vaping status: Never Used  Substance Use Topics   Alcohol use: No    Alcohol/week: 0.0 standard drinks of alcohol   Drug use: No    Family History  Problem Relation Age of Onset   Hypertension Mother    Stroke Mother    Heart disease Father    Stroke Brother    Multiple sclerosis Brother    Multiple sclerosis Sister    Colon cancer Neg Hx    Colon polyps Neg Hx    Esophageal cancer Neg Hx    Rectal cancer Neg Hx    Stomach cancer Neg Hx     Allergies  Allergen Reactions   Crestor  [Rosuvastatin ] Other (See Comments)    Myalgia; patient can only tolerate taking 10 mg every other day   Robaxin  [Methocarbamol ] Other (See Comments)     Insomnia   2,4-D Dimethylamine    Toradol  [Ketorolac  Tromethamine ] Other (See Comments)   Zocor [Simvastatin] Other (See Comments)    Myalgias    Lipitor [Atorvastatin ] Other (See Comments)    Myalgia   Sulfa  Antibiotics Hives and Nausea And Vomiting    Health Maintenance  Topic Date Due   Zoster Vaccines- Shingrix (1 of 2) Never done   Diabetic kidney evaluation - Urine ACR  06/14/2018   FOOT EXAM  08/20/2018   OPHTHALMOLOGY EXAM  10/01/2018   Pneumococcal Vaccine: 50+ Years (3 of 3 - PCV) 02/06/2019   COVID-19 Vaccine (3 - Pfizer risk series) 09/25/2019   DEXA SCAN  Never done   HEMOGLOBIN A1C  11/21/2023   INFLUENZA VACCINE  12/23/2023   Medicare Annual Wellness (AWV)  01/04/2024   Diabetic kidney evaluation - eGFR measurement  08/08/2024   MAMMOGRAM  02/23/2025   DTaP/Tdap/Td (2 - Td or Tdap) 02/19/2027   Colonoscopy  11/15/2027   Hepatitis C Screening  Completed   Hepatitis B Vaccines  Aged Out   HPV VACCINES  Aged Out   Meningococcal B Vaccine  Aged Out    Objective: BP (!) 146/82   Pulse 88   Temp 98.7 F (37.1 C) (Oral)   Wt 203 lb (92.1 kg)   SpO2 98%   BMI 42.43 kg/m    Physical Exam Constitutional:      Appearance: Normal appearance. Morbidly obese  HENT:     Head: Normocephalic and atraumatic.      Mouth: Mucous membranes are moist.  Eyes:    Conjunctiva/sclera: Conjunctivae normal.     Pupils: Pupils are equal, round, and b/l symmetrical    Cardiovascular:     Rate and  Rhythm: Normal rate and regular rhythm.     Heart sounds: s1s2  Pulmonary:     Effort: Pulmonary effort is normal.     Breath sounds: Normal breath sounds.   Abdominal:     General: Non distended     Palpations: soft.   Musculoskeletal:        General: ambulatory with cane, no back tenderness  Skin:    General: Skin is warm and dry.     Comments: Posterior lumbar wound has healed   Neurological:     General: grossly non focal     Mental Status: awake, alert and  oriented to person, place, and time.   Psychiatric:        Mood and Affect: Mood normal.   Lab Results Lab Results  Component Value Date   WBC 6.1 08/09/2023   HGB 10.2 (L) 08/09/2023   HCT 33.3 (L) 08/09/2023   MCV 79.3 (L) 08/09/2023   PLT 273 08/09/2023    Lab Results  Component Value Date   CREATININE 1.58 (H) 08/09/2023   BUN 25 08/09/2023   NA 145 08/09/2023   K 4.4 08/09/2023   CL 108 08/09/2023   CO2 26 08/09/2023    Lab Results  Component Value Date   ALT 18 08/09/2023   AST 19 08/09/2023   ALKPHOS 69 06/14/2023   BILITOT 0.2 08/09/2023    Lab Results  Component Value Date   CHOL 153 02/03/2023   HDL 43 02/03/2023   LDLCALC 86 02/03/2023   LDLDIRECT 106 (H) 04/27/2023   TRIG 135 02/03/2023   CHOLHDL 3.6 02/03/2023   No results found for: LABRPR, RPRTITER No results found for: HIV1RNAQUANT, HIV1RNAVL, CD4TABS   Microbiology Results for orders placed or performed during the hospital encounter of 06/28/23  Resp panel by RT-PCR (RSV, Flu A&B, Covid) Anterior Nasal Swab     Status: None   Collection Time: 06/28/23 10:56 PM   Specimen: Anterior Nasal Swab  Result Value Ref Range Status   SARS Coronavirus 2 by RT PCR NEGATIVE NEGATIVE Final    Comment: (NOTE) SARS-CoV-2 target nucleic acids are NOT DETECTED.  The SARS-CoV-2 RNA is generally detectable in upper respiratory specimens during the acute phase of infection. The lowest concentration of SARS-CoV-2 viral copies this assay can detect is 138 copies/mL. A negative result does not preclude SARS-Cov-2 infection and should not be used as the sole basis for treatment or other patient management decisions. A negative result may occur with  improper specimen collection/handling, submission of specimen other than nasopharyngeal swab, presence of viral mutation(s) within the areas targeted by this assay, and inadequate number of viral copies(<138 copies/mL). A negative result must be combined  with clinical observations, patient history, and epidemiological information. The expected result is Negative.  Fact Sheet for Patients:  BloggerCourse.com  Fact Sheet for Healthcare Providers:  SeriousBroker.it  This test is no t yet approved or cleared by the [place]  FDA and  has been authorized for detection and/or diagnosis of SARS-CoV-2 by FDA under an Emergency Use Authorization (EUA). This EUA will remain  in effect (meaning this test can be used) for the duration of the COVID-19 declaration under Section 564(b)(1) of the Act, 21 U.S.C.section 360bbb-3(b)(1), unless the authorization is terminated  or revoked sooner.       Influenza A by PCR NEGATIVE NEGATIVE Final   Influenza B by PCR NEGATIVE NEGATIVE Final    Comment: (NOTE) The Xpert Xpress SARS-CoV-2/FLU/RSV plus assay is intended as an  aid in the diagnosis of influenza from Nasopharyngeal swab specimens and should not be used as a sole basis for treatment. Nasal washings and aspirates are unacceptable for Xpert Xpress SARS-CoV-2/FLU/RSV testing.  Fact Sheet for Patients: BloggerCourse.com  Fact Sheet for Healthcare Providers: SeriousBroker.it  This test is not yet approved or cleared by the [place]  FDA and has been authorized for detection and/or diagnosis of SARS-CoV-2 by FDA under an Emergency Use Authorization (EUA). This EUA will remain in effect (meaning this test can be used) for the duration of the COVID-19 declaration under Section 564(b)(1) of the Act, 21 U.S.C. section 360bbb-3(b)(1), unless the authorization is terminated or revoked.     Resp Syncytial Virus by PCR NEGATIVE NEGATIVE Final    Comment: (NOTE) Fact Sheet for Patients: BloggerCourse.com  Fact Sheet for Healthcare Providers: SeriousBroker.it  This test is not yet approved  or cleared by the [place]  FDA and has been authorized for detection and/or diagnosis of SARS-CoV-2 by FDA under an Emergency Use Authorization (EUA). This EUA will remain in effect (meaning this test can be used) for the duration of the COVID-19 declaration under Section 564(b)(1) of the Act, 21 U.S.C. section 360bbb-3(b)(1), unless the authorization is terminated or revoked.  Performed at [facility], [address], [place], [place] [address]   Culture, blood (Routine X 2) w Reflex to ID Panel     Status: None   Collection Time: 06/29/23  3:47 PM   Specimen: BLOOD LEFT HAND  Result Value Ref Range Status   Specimen Description BLOOD LEFT HAND  Final   Special Requests   Final    BOTTLES DRAWN AEROBIC AND ANAEROBIC Blood Culture results may not be optimal due to an inadequate volume of blood received in culture bottles   Culture   Final    NO GROWTH 6 DAYS Performed at [facility], [address], [place], [place] [address]    Report Status 07/05/2023 FINAL  Final  Culture, blood (Routine X 2) w Reflex to ID Panel     Status: None   Collection Time: 06/29/23  3:49 PM   Specimen: BLOOD RIGHT ARM  Result Value Ref Range Status   Specimen Description BLOOD RIGHT ARM  Final   Special Requests   Final    BOTTLES DRAWN AEROBIC ONLY Blood Culture results may not be optimal due to an inadequate volume of blood received in culture bottles   Culture   Final    NO GROWTH 6 DAYS Performed at [facility], [address], [place], [place] [address]    Report Status 07/05/2023 FINAL  Final   Imaging 11/10/23 MRI L spine IMPRESSION: 1. No evidence of ongoing infection or acute fracture. 2. Stable grade 1 anterolisthesis at L3-4. 3. Posterior spinal hardware at L2, L4, L5, and S1 bilaterally with superior subluxation of pedicle screws bilaterally.  Assessment/Plan # Post operative lumbar SSI complicated with OM/hardware - S/p completion of IV and  PO antibiotics as above and being monitored off antibiotics  - Doing well   Plan - continue to monitor off antibiotics  - CBC, ESR and CRP today  - fu in 4-6 weeks with Dr Overton  # Morbid Obesity  - will benefit from weight loss measures   I spent 38  minutes involved in face-to-face and non-face-to-face activities for this patient on the day of the visit. Professional time spent includes the following activities: Preparing to see the patient (review of tests), Obtaining and  reviewing separately obtained history (prior notes from Dr Overton, culture reports), Performing a medically appropriate examination and evaluation, ordering labs, Documenting clinical information in the EMR, Independently interpreting results (not separately reported), Communicating results to the patient, Counseling and educating the patient and Care coordination (not separately reported).   Of note, portions of this note may have been created with voice recognition software. While this note has been edited for accuracy, occasional wrong-word or 'sound-a-like' substitutions may have occurred due to the inherent limitations of voice recognition software.   Annalee Joseph, MD [facility] Infectious Disease [facility] 12/29/2023, 2:14 PM

## 2023-12-30 ENCOUNTER — Ambulatory Visit: Payer: Self-pay | Admitting: Infectious Diseases

## 2023-12-30 LAB — CBC
HCT: 38.4 % (ref 35.0–45.0)
Hemoglobin: 11.6 g/dL — ABNORMAL LOW (ref 11.7–15.5)
MCH: 23.5 pg — ABNORMAL LOW (ref 27.0–33.0)
MCHC: 30.2 g/dL — ABNORMAL LOW (ref 32.0–36.0)
MCV: 77.9 fL — ABNORMAL LOW (ref 80.0–100.0)
MPV: 11.9 fL (ref 7.5–12.5)
Platelets: 228 Thousand/uL (ref 140–400)
RBC: 4.93 Million/uL (ref 3.80–5.10)
RDW: 16.4 % — ABNORMAL HIGH (ref 11.0–15.0)
WBC: 6.4 Thousand/uL (ref 3.8–10.8)

## 2023-12-30 LAB — SEDIMENTATION RATE: Sed Rate: 31 mm/h — ABNORMAL HIGH (ref 0–30)

## 2023-12-30 LAB — C-REACTIVE PROTEIN: CRP: 24.1 mg/L — ABNORMAL HIGH (ref <8.0)

## 2023-12-31 DIAGNOSIS — A498 Other bacterial infections of unspecified site: Secondary | ICD-10-CM | POA: Insufficient documentation

## 2023-12-31 DIAGNOSIS — Z79899 Other long term (current) drug therapy: Secondary | ICD-10-CM | POA: Insufficient documentation

## 2024-01-10 ENCOUNTER — Other Ambulatory Visit (HOSPITAL_COMMUNITY): Payer: Self-pay

## 2024-01-11 ENCOUNTER — Ambulatory Visit: Attending: Cardiovascular Disease | Admitting: Physician Assistant

## 2024-01-11 ENCOUNTER — Encounter: Payer: Self-pay | Admitting: Physician Assistant

## 2024-01-11 VITALS — BP 154/90 | HR 79 | Ht <= 58 in | Wt 204.0 lb

## 2024-01-11 DIAGNOSIS — E785 Hyperlipidemia, unspecified: Secondary | ICD-10-CM | POA: Diagnosis not present

## 2024-01-11 DIAGNOSIS — Z952 Presence of prosthetic heart valve: Secondary | ICD-10-CM

## 2024-01-11 DIAGNOSIS — I10 Essential (primary) hypertension: Secondary | ICD-10-CM | POA: Diagnosis not present

## 2024-01-11 DIAGNOSIS — I350 Nonrheumatic aortic (valve) stenosis: Secondary | ICD-10-CM | POA: Diagnosis not present

## 2024-01-11 DIAGNOSIS — Z7901 Long term (current) use of anticoagulants: Secondary | ICD-10-CM

## 2024-01-11 DIAGNOSIS — I5032 Chronic diastolic (congestive) heart failure: Secondary | ICD-10-CM

## 2024-01-11 MED ORDER — FUROSEMIDE 20 MG PO TABS: 20.0000 mg | ORAL_TABLET | Freq: Every day | ORAL | 3 refills | Status: AC

## 2024-01-11 MED ORDER — AMIODARONE HCL 200 MG PO TABS: 100.0000 mg | ORAL_TABLET | Freq: Every day | ORAL | 3 refills | Status: DC

## 2024-01-11 NOTE — Progress Notes (Signed)
 Office Visit    Patient Name: [name] Date of Encounter: 01/11/2024  PCP:  Shepperson, Kirstin, PA-C   [facility]  Cardiologist:  Maude Emmer, MD  Advanced Practice Provider:  Madie Jon Garre, PA Electrophysiologist:  None  HPI    [name] is a 68 y.o. female with past medical history significant for severe aortic stenosis status post SAVR 05/2023 anxiety, asthma, obesity, diabetes mellitus type 2, GERD, hypertension, hyperlipidemia presents today for overdue follow-up appointment.  She was last seen in 03/2020 and at that time she had been on disability since her spinal fusion in 2019.  She had a history of moderate aortic stenosis with TTE done 03/13/2020 with mean gradient 31 mmHg peak 48 mmHg and mild AR.  She had no history of CAD Myoview  done 03/26/2019 with no ischemia EF 60%.  She was seen by me 8/23 for cardiac clearance, she states she is not having any cardiovascular symptoms today.  She does have a blood pressure cuff at home and tries to monitor.  She states next week she is having back surgery.  Our office has not received any cardiac clearance for her surgery.  However, we did go through the [facility] today and she barely met our 4 METS minimum requirement.  She scored a 4.31 METS.  It appears that her back limits her more than her cardiovascular health.  She uses a walker. She has a bad slipped disk  I saw her in May 2020 for, she tells me that her BP has been staying high. [facility] and does some [facility]. She has been doing a seated ellipital and different resistance machines. She has been making an effort to stay active. Otherwise, we reviewed her PET/CT and her echo today.  Reports no shortness of breath nor dyspnea on exertion. Reports no chest pain, pressure, or tightness. No edema, orthopnea, PND. Reports no palpitations.  Today, she presents with heart valve replacement and  hypertension for cardiovascular follow-up and medication management.  She takes Lasix  40 mg approximately three times a week for fluid retention and is considering adjusting to 20 mg daily. She experiences tightness in her hands and occasional shortness of breath, with slight puffiness in her feet but no significant ankle swelling. An echocardiogram was performed in March. She continues on amiodarone  200 mg daily for heart rhythm management and has not experienced heart racing or skipped beats. She is on Eliquis  for stroke prevention due to atrial fibrillation. Her blood pressure is elevated, and her last kidney function test in March showed a creatinine level of 1.4.  Reports no chest pain, pressure, or tightness. No edema, orthopnea, PND. Reports no palpitations.   Discussed the use of AI scribe software for clinical note transcription with the patient, who gave verbal consent to proceed.   Past Medical History    Past Medical History:  Diagnosis Date   Allergy    Anemia    Anxiety    Arthritis    Asthma    BMI 40.0-44.9, adult (HCC) 04/07/2014   DM type 2 (diabetes mellitus, type 2) (HCC)    GERD (gastroesophageal reflux disease)    HTN (hypertension)    Hypercholesterolemia    Osteoarthritis of left hip 04/07/2014   Reflux    Severe aortic stenosis    Spinal headache    with C-Section and with spinal fusion in 2019   Past Surgical History:  Procedure Laterality Date  ASCENDING AORTIC ROOT REPLACEMENT N/A 06/14/2023   Procedure: ASCENDING AORTIC ROOT REPLACEMENT USING KONECT RESILIA AORTIC VALVE CONDUIT SIZE AND REATTACHMENT OF RIGHT AND LEFT CORONARY;  Surgeon: Maryjane Mt, MD;  Location: [facility] OR;  Service: Open Heart Surgery;  Laterality: N/A;   BACK SURGERY  2023   lumbar fusion, got infection, then had to re-do fusion   CESAREAN SECTION     x3   COLONOSCOPY     LAMINECTOMY  03/2021   LEFT HEART CATH AND CORONARY ANGIOGRAPHY N/A 04/06/2023   Procedure: LEFT HEART  CATH AND CORONARY ANGIOGRAPHY;  Surgeon: Wonda Sharper, MD;  Location: [facility] INVASIVE CV LAB;  Service: Cardiovascular;  Laterality: N/A;   SPINAL FUSION  2019   TEE WITHOUT CARDIOVERSION N/A 06/14/2023   Procedure: TRANSESOPHAGEAL ECHOCARDIOGRAM (TEE);  Surgeon: Maryjane Mt, MD;  Location: [facility] OR;  Service: Open Heart Surgery;  Laterality: N/A;   TUBAL LIGATION      Allergies  Allergies  Allergen Reactions   Crestor  [Rosuvastatin ] Other (See Comments)    Myalgia; patient can only tolerate taking 10 mg every other day   Robaxin  [Methocarbamol ] Other (See Comments)    Insomnia   2,4-D Dimethylamine    Toradol  [Ketorolac  Tromethamine ] Other (See Comments)   Zocor [Simvastatin] Other (See Comments)    Myalgias    Lipitor [Atorvastatin ] Other (See Comments)    Myalgia   Sulfa  Antibiotics Hives and Nausea And Vomiting     EKGs/Labs/Other Studies Reviewed:   The following studies were reviewed today:  Echocardiogram 01/2021  IMPRESSIONS     1. Left ventricular ejection fraction, by estimation, is 70 to 75%. The  left ventricle has hyperdynamic function. The left ventricle has no  regional wall motion abnormalities. There is mild concentric left  ventricular hypertrophy. Left ventricular  diastolic parameters were normal.   2. Right ventricular systolic function is normal. The right ventricular  size is normal.   3. The mitral valve is normal in structure. Trivial mitral valve  regurgitation. No evidence of mitral stenosis.   4. The aortic valve has an indeterminant number of cusps. There is  moderate calcification of the aortic valve. There is mild thickening of  the aortic valve. Aortic valve regurgitation is mild. Moderate aortic  valve stenosis. Aortic valve area, by VTI  measures 1.14 cm. Aortic valve mean gradient measures 19.7 mmHg. Aortic  valve Vmax measures 3.04 m/s.   5. The inferior vena cava is normal in size with greater than 50%  respiratory variability,  suggesting right atrial pressure of 3 mmHg.   6. Intra-atrial shunting cannot be excluded.   Comparison(s): No significant change from prior study. Prior echo moderate  AS; mean gradient slightly higher at 31 mmHg on prior study.   EKG:  EKG is not ordered today.  Recent Labs: 04/27/2023: NT-Pro BNP 315 04/28/2023: TSH 4.110 06/24/2023: Magnesium  2.0 06/28/2023: B Natriuretic Peptide 356.0 08/09/2023: ALT 18; BUN 25; Creat 1.58; Potassium 4.4; Sodium 145 12/29/2023: Hemoglobin 11.6; Platelets 228  Recent Lipid Panel    Component Value Date/Time   CHOL 153 02/03/2023 0847   TRIG 135 02/03/2023 0847   HDL 43 02/03/2023 0847   CHOLHDL 3.6 02/03/2023 0847   CHOLHDL 3.2 11/12/2015 1436   VLDL 28 11/12/2015 1436   LDLCALC 86 02/03/2023 0847   LDLDIRECT 106 (H) 04/27/2023 1114    Home Medications   Current Meds  Medication Sig   acetaminophen  (TYLENOL ) 500 MG tablet Take 1,000 mg by mouth every 6 (six) hours  as needed for mild pain (pain score 1-3) or headache.   albuterol  (PROAIR  HFA) 108 (90 BASE) MCG/ACT inhaler Inhale 2 puffs into the lungs every 6 (six) hours as needed.   amiodarone  (PACERONE ) 200 MG tablet Take 0.5 tablets (100 mg total) by mouth daily.   amLODipine  (NORVASC ) 10 MG tablet Take 1 tablet (10 mg total) by mouth daily.   apixaban  (ELIQUIS ) 5 MG TABS tablet Take 1 tablet (5 mg total) by mouth 2 (two) times daily.   ascorbic acid (VITAMIN C) 500 MG tablet Take 500 mg by mouth daily.   aspirin  EC 81 MG tablet Take 1 tablet (81 mg total) by mouth daily. Swallow whole.   celecoxib  (CELEBREX ) 200 MG capsule Take by mouth 2 (two) times daily.   cetirizine  (ZYRTEC ) 10 MG tablet Take 1 tablet (10 mg total) by mouth daily.   docusate sodium  (COLACE) 100 MG capsule Take 200-300 mg by mouth at bedtime.   ezetimibe  (ZETIA ) 10 MG tablet Take 1 tablet (10 mg total) by mouth daily.   fluticasone  (FLONASE ) 50 MCG/ACT nasal spray Place 2 sprays into both nostrils daily.   furosemide   (LASIX ) 20 MG tablet Take 1 tablet (20 mg total) by mouth daily.   gabapentin  (NEURONTIN ) 600 MG tablet Take 600 mg by mouth 3 (three) times daily.   iron  polysaccharides (NIFEREX) 150 MG capsule TAKE 1 CAPSULE (150 MG DOSE) BY MOUTH TWICE A DAY   losartan  (COZAAR ) 100 MG tablet Take 1 tablet (100 mg total) by mouth daily.   metoprolol  tartrate (LOPRESSOR ) 25 MG tablet Take 1 tablet (25 mg total) by mouth 2 (two) times daily.   pantoprazole  (PROTONIX ) 40 MG tablet Take 40 mg by mouth daily.   polyvinyl alcohol (LIQUIFILM TEARS) 1.4 % ophthalmic solution Place 1 drop into both eyes as needed for dry eyes.   rosuvastatin  (CRESTOR ) 10 MG tablet Take 1 tablet (10 mg total) by mouth every other day.   Semaglutide , 2 MG/DOSE, (OZEMPIC , 2 MG/DOSE,) 8 MG/3ML SOPN Inject 2 mg into the skin once a week.   VEOZAH 45 MG TABS Take 1 tablet by mouth daily.   [DISCONTINUED] amiodarone  (PACERONE ) 200 MG tablet Take 1 tablet (200 mg total) by mouth daily.   [DISCONTINUED] furosemide  (LASIX ) 40 MG tablet Take 1 tablet (40 mg total) by mouth daily as needed for edema or fluid (shortness of breath).     Review of Systems      All other systems reviewed and are otherwise negative except as noted above.  Physical Exam    VS:  BP (!) 154/90   Pulse 79   Ht 4' 10 (1.473 m)   Wt 204 lb (92.5 kg)   SpO2 98%   BMI 42.64 kg/m  , BMI Body mass index is 42.64 kg/m.  Wt Readings from Last 3 Encounters:  01/11/24 204 lb (92.5 kg)  12/29/23 203 lb (92.1 kg)  12/07/23 202 lb 13.2 oz (92 kg)     GEN: Well nourished, well developed, in no acute distress. HEENT: normal. Neck: Supple, no JVD, carotid bruits, or masses. Cardiac: RRR, + 2/6 systolic murmur radiates to the carotids, rubs, or gallops. No clubbing, cyanosis, edema.  Radials/PT 2+ and equal bilaterally.  Respiratory:  Respirations regular and unlabored, clear to auscultation bilaterally. GI: Soft, nontender, nondistended. MS: No deformity or  atrophy. Skin: Warm and dry, no rash. Neuro:  Strength and sensation are intact. Psych: Normal affect.  Assessment & Plan   Heart failure with preserved ejection  fraction Echocardiogram showed preserved ejection fraction with left ventricular thickening and mild mitral regurgitation, suggesting diastolic dysfunction. - Adjust furosemide  to 20 mg daily. - Check kidney function today. Daily weights  Hypertensive heart disease Left ventricular thickening noted on echocardiogram with elevated blood pressure today. - Continue home monitoring and if remains elevated please let me know  Paroxysmal atrial fibrillation/CHB after surgery Managed with amiodarone  and Eliquis . The patient mentioned that she haven't really noticed any issues with her heart racing or skipped heartbeats. Continued Eliquis  for stroke prevention. - Reduce amiodarone  to 100 mg daily. - Continue Eliquis .  Chronic kidney disease, unspecified stage Creatinine level of 1.4 noted. Kidney function requires monitoring with diuretic use. - Check kidney function today.  Status post aortic valve replacement Replaced aortic valve functioning well.  Mild mitral valve regurgitation Mild regurgitation noted on echocardiogram without significant symptoms.  Obesity Weight around 200 pounds with no significant loss on Ozempic . Interested in Mounjaro  for weight management. - Consider switching to Mounjaro  if Ozempic  is ineffective. - Coordinate with primary care or PharmDs for Mounjaro  prescription and insurance approval.  General Health Maintenance - Monitor carotid artery disease with periodic ultrasounds.   Disposition: Follow up 6 months with Maude Emmer, MD or APP.  Signed, Orren LOISE Fabry, PA-C 01/11/2024, 4:38 PM [facility]

## 2024-01-11 NOTE — Patient Instructions (Addendum)
 Medication Instructions:  Your physician has recommended you make the following change in your medication:  DECREASE LASIX  TO 20 MG DAILY DECREASE AMIODARONE  TO 100 MG DAILY.    *If you need a refill on your cardiac medications before your next appointment, please call your pharmacy*  Lab Work: TODAY: BMET If you have labs (blood work) drawn today and your tests are completely normal, you will receive your results only by: MyChart Message (if you have MyChart) OR A paper copy in the mail If you have any lab test that is abnormal or we need to change your treatment, we will call you to review the results.  Testing/Procedures: NONE  Follow-Up: At [facility], you and your health needs are our priority.  As part of our continuing mission to provide you with exceptional heart care, our providers are all part of one team.  This team includes your primary Cardiologist (physician) and Advanced Practice Providers or APPs (Physician Assistants and Nurse Practitioners) who all work together to provide you with the care you need, when you need it.  Your next appointment:   6 month(s)  Provider:   Maude Emmer, MD   We recommend signing up for the patient portal called MyChart.  Sign up information is provided on this After Visit Summary.  MyChart is used to connect with patients for Virtual Visits (Telemedicine).  Patients are able to view lab/test results, encounter notes, upcoming appointments, etc.  Non-urgent messages can be sent to your provider as well.   To learn more about what you can do with MyChart, go to [email or web address].   Other Instructions Please check your blood pressure 1-2 times per day for 2 weeks and send readings to [facility], PA-C.

## 2024-01-12 ENCOUNTER — Ambulatory Visit: Payer: Self-pay | Admitting: Physician Assistant

## 2024-01-12 LAB — BASIC METABOLIC PANEL WITH GFR
BUN/Creatinine Ratio: 18 (ref 12–28)
BUN: 20 mg/dL (ref 8–27)
CO2: 26 mmol/L (ref 20–29)
Calcium: 9.9 mg/dL (ref 8.7–10.3)
Chloride: 100 mmol/L (ref 96–106)
Creatinine, Ser: 1.14 mg/dL — ABNORMAL HIGH (ref 0.57–1.00)
Glucose: 98 mg/dL (ref 70–99)
Potassium: 3.6 mmol/L (ref 3.5–5.2)
Sodium: 142 mmol/L (ref 134–144)
eGFR: 53 mL/min/1.73 — ABNORMAL LOW

## 2024-01-30 ENCOUNTER — Other Ambulatory Visit: Payer: Self-pay | Admitting: Cardiovascular Disease

## 2024-01-30 ENCOUNTER — Other Ambulatory Visit (HOSPITAL_COMMUNITY): Payer: Self-pay

## 2024-01-30 MED ORDER — APIXABAN 5 MG PO TABS
5.0000 mg | ORAL_TABLET | Freq: Two times a day (BID) | ORAL | 5 refills | Status: DC
  Filled 2024-01-30: qty 60, 30d supply, fill #0
  Filled 2024-03-12: qty 60, 30d supply, fill #1
  Filled 2024-04-09 – 2024-04-20 (×2): qty 60, 30d supply, fill #2
  Filled 2024-05-18: qty 60, 30d supply, fill #3
  Filled 2024-06-26: qty 60, 30d supply, fill #4

## 2024-01-30 NOTE — Telephone Encounter (Signed)
 Prescription refill request for Eliquis  received. Indication: AF Last office visit: 01/11/24  T Conte PA-C Scr: 1.14 on 01/11/24  Epic Age: 68 Weight: 92.5kg  Based on above findings Eliquis  5mg  twice daily is the appropriate dose.  Refill approved.

## 2024-02-01 ENCOUNTER — Ambulatory Visit: Admitting: Internal Medicine

## 2024-02-27 ENCOUNTER — Other Ambulatory Visit: Payer: Self-pay | Admitting: Physician Assistant

## 2024-02-27 DIAGNOSIS — Z1231 Encounter for screening mammogram for malignant neoplasm of breast: Secondary | ICD-10-CM

## 2024-03-02 ENCOUNTER — Ambulatory Visit: Admitting: Internal Medicine

## 2024-03-08 ENCOUNTER — Ambulatory Visit
Admission: RE | Admit: 2024-03-08 | Discharge: 2024-03-08 | Disposition: A | Discharge status: Home / Self Care | Source: Ambulatory Visit | Attending: Physician Assistant | Admitting: Physician Assistant

## 2024-03-08 DIAGNOSIS — Z1231 Encounter for screening mammogram for malignant neoplasm of breast: Secondary | ICD-10-CM

## 2024-03-12 ENCOUNTER — Other Ambulatory Visit (HOSPITAL_COMMUNITY): Payer: Self-pay

## 2024-03-14 ENCOUNTER — Other Ambulatory Visit (HOSPITAL_COMMUNITY): Payer: Self-pay

## 2024-03-21 ENCOUNTER — Other Ambulatory Visit (HOSPITAL_COMMUNITY): Payer: Self-pay

## 2024-03-22 ENCOUNTER — Other Ambulatory Visit: Payer: Self-pay | Admitting: Physician Assistant

## 2024-03-22 MED ORDER — ROSUVASTATIN CALCIUM 10 MG PO TABS: 10.0000 mg | ORAL_TABLET | ORAL | 3 refills | Status: AC

## 2024-03-27 ENCOUNTER — Other Ambulatory Visit (HOSPITAL_COMMUNITY): Payer: Self-pay

## 2024-03-29 ENCOUNTER — Other Ambulatory Visit (HOSPITAL_COMMUNITY): Payer: Self-pay

## 2024-04-05 ENCOUNTER — Other Ambulatory Visit (HOSPITAL_COMMUNITY): Payer: Self-pay

## 2024-04-09 ENCOUNTER — Other Ambulatory Visit (HOSPITAL_COMMUNITY): Payer: Self-pay

## 2024-04-18 ENCOUNTER — Other Ambulatory Visit (HOSPITAL_COMMUNITY): Payer: Self-pay

## 2024-04-20 ENCOUNTER — Other Ambulatory Visit (HOSPITAL_COMMUNITY): Payer: Self-pay

## 2024-05-01 ENCOUNTER — Other Ambulatory Visit: Payer: Self-pay

## 2024-05-03 MED ORDER — AMIODARONE HCL 200 MG PO TABS: 100.0000 mg | ORAL_TABLET | Freq: Every day | ORAL | 2 refills | Status: AC

## 2024-05-03 MED ORDER — METOPROLOL TARTRATE 25 MG PO TABS: 25.0000 mg | ORAL_TABLET | Freq: Two times a day (BID) | ORAL | 2 refills | Status: AC

## 2024-05-04 ENCOUNTER — Ambulatory Visit: Admitting: Internal Medicine

## 2024-05-10 ENCOUNTER — Encounter: Payer: Self-pay | Admitting: Internal Medicine

## 2024-05-10 ENCOUNTER — Ambulatory Visit: Admitting: Internal Medicine

## 2024-05-10 VITALS — BP 145/76 | HR 82 | Temp 98.4°F | Resp 16 | Wt 202.0 lb

## 2024-05-10 DIAGNOSIS — M8668 Other chronic osteomyelitis, other site: Secondary | ICD-10-CM

## 2024-05-10 DIAGNOSIS — T8140XD Infection following a procedure, unspecified, subsequent encounter: Secondary | ICD-10-CM | POA: Diagnosis not present

## 2024-05-10 DIAGNOSIS — T847XXD Infection and inflammatory reaction due to other internal orthopedic prosthetic devices, implants and grafts, subsequent encounter: Secondary | ICD-10-CM

## 2024-05-10 NOTE — Patient Instructions (Signed)
 Let's get one more set of blood test today   It is almost 6 months out from antibiotics and unlikely if we don't see the infection relapse now that it'll come back  However, continue to monitor for PERSISTENT PROGRESSIVE new pain in the back and let me know if that occurs  No need to schedule any appointment with me at this time

## 2024-05-10 NOTE — Progress Notes (Signed)
 [facility]  Patient Active Problem List   Diagnosis Date Noted   Medication management 12/31/2023   Proteus infection 12/31/2023   Hardware complicating wound infection 12/29/2023   Morbid obesity (HCC) 06/29/2023   Dyspnea 06/28/2023   AKI (acute kidney injury) 06/19/2023   Complete heart block (HCC) 06/17/2023   Hyperglycemia 06/17/2023   Fever 06/17/2023   S/P AVR (aortic valve replacement) and aortoplasty 06/14/2023   Acute CHF (congestive heart failure) (HCC) 04/28/2023   Acute on chronic heart failure with preserved ejection fraction (HCC) 04/27/2023   Severe aortic stenosis    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   Microcytic anemia 01/25/2023   Class 2 severe obesity due to excess calories with serious comorbidity and body mass index (BMI) of 38.0 to 38.9 in adult 07/08/2022   Primary osteoarthritis of right knee 07/08/2022   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 02/05/2022   Bacteremia due to Proteus species    Sepsis (HCC) 02/03/2022   Iron  deficiency anemia 02/03/2022   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   S/P lumbar spinal fusion 11/17/2021   Lumbar radiculopathy 04/21/2021   Type 2 diabetes mellitus without complication, without long-term current use of insulin  (HCC) 12/22/2018   Hypertriglyceridemia 10/19/2018   Spondylolisthesis of lumbar region 02/01/2018   Gastroesophageal reflux disease without esophagitis 01/16/2018   Skin inflammation 08/25/2017   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   MRN: [id number]  Chief Complaint  Patient presents with   Follow-up    Hardware  complicating wound infection     HPI:  [name] is a 68 y.o. female here for f/u hx 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  She had associated bacteremia proteus mirabilis blood stream infection 02/03/2022, in setting mri imaging lumbar surgical site abscess/cellulitis. S/p I&D with all old hardware removed but new screws placed on 9/15  04/07/22 id clinic visit She had finished iv ceftriaxone  of 8 weeks by 04/02/2022 and transitioned to bactrim  ss bid; the cefazolin  mic was 8  She walks with fww. She is sore in lower back bilaterally when moving but nothing in the middle. This is chronic. 7 at worst; 2/10 resting   No fever, chill, diarrhea   05/05/22 id clinic visit She had chart hx of sulfa  allergy although she doesn't recall any bactrim  use or other sulfa  product use in the past She tolerated bactrim  ss bid fine without n/v/diarrhea/rash Back pain is stable No f/c  She complains of 3-4 weeks right anterior thigh numbness/tingling. She wears tight clothing now and then but not consistently.   She has right lower ext weakness after back surgery but that is getting better. Lower back pain 3/10 not bad. Bilateral lower back sore/stiff after pt/ot session though. Overall since back surgery 01/2022 leg weakness/back pain better. Walks with walker still  Also complains of chronic tongue burning with things like toothpaste, spicy food. Sensation transient. Doesn't disturb her in other ways. This was heard while she was on ceftriaxone  as well.      10/26/22 id clinic f/u Crp has been  high still. She continues on bactrim  ds 1 tab bid She still have significant nausea but controlled with zofran  Back pain 2/10 minimal like before We spoke about trial off medication abx 07/2022 but given crp will keep going, until we get 2 normal over 6 months span before considering it again    04/26/23 id clinic f/u    Component Value Date/Time   CRP 24.1  (H) 12/29/2023 1424   CRP 35.2 (H) 11/01/2023 1136   CRP 31.1 (H) 08/09/2023 1119   Lab Results  Component Value Date   CREATININE 1.14 (H) 01/11/2024   Patient is here for f/u today She has been seeing cardiology for aortic stenosis planned for January Patient still taking bactrim  suppressive abx She said she has gained a lot of weight and more dyspnea since seeing cardiology 2-3 weeks ago -- she hasn't spoken with them yet  She doesnot take any loop diuretics  The back is good. Right leg anterior pain stable/chronic since surgery No fever/chill  Appetite is too good.   08/09/23 id clinic f/u Reviewed 06/2023 labs Crp improving Doing well with back no new pain No f/c  She is s/p TAVR in 05/2023. Doing well with that -- no more dyspnea. And also the night pain in the hands also improves. No swelling in legs   Continues to take bactrim   Patient is on eliquis  for afib    11/24/23 id clinic f/u Mri ordered last visit 11/01/23 no sign of active infection; subluxed screws mentioned Mostly use cane; sometimes fww See a&p   Allergies: Allergies  Allergen Reactions   Crestor  [Rosuvastatin ] Other (See Comments)    Myalgia; patient can only tolerate taking 10 mg every other day   Robaxin  [Methocarbamol ] Other (See Comments)    Insomnia   2,4-D Dimethylamine    Toradol  [Ketorolac  Tromethamine ] Other (See Comments)   Zocor [Simvastatin] Other (See Comments)    Myalgias    Lipitor [Atorvastatin ] Other (See Comments)    Myalgia   Sulfa  Antibiotics Hives and Nausea And Vomiting      Outpatient Medications Prior to Visit  Medication Sig Dispense Refill   acetaminophen  (TYLENOL ) 500 MG tablet Take 1,000 mg by mouth every 6 (six) hours as needed for mild pain (pain score 1-3) or headache.     albuterol  (PROAIR  HFA) 108 (90 BASE) MCG/ACT inhaler Inhale 2 puffs into the lungs every 6 (six) hours as needed. 1 Inhaler 12   amiodarone  (PACERONE ) 200 MG tablet Take 0.5 tablets  (100 mg total) by mouth daily. 45 tablet 2   amLODipine  (NORVASC ) 10 MG tablet Take 1 tablet (10 mg total) by mouth daily. 90 tablet 1   amoxicillin  (AMOXIL ) 500 MG tablet Take 4 tablets (2000 mg) by mouth one hour prior to dental procedure. (Patient not taking: Reported on 05/10/2024) 4 tablet 1   apixaban  (ELIQUIS ) 5 MG TABS tablet Take 1 tablet (5 mg total) by mouth 2 (two) times daily. 60 tablet 5   ascorbic acid (VITAMIN C) 500 MG tablet Take 500 mg by mouth daily.     aspirin  EC 81 MG tablet Take 1 tablet (81 mg total) by mouth daily. Swallow whole.     celecoxib  (CELEBREX ) 200 MG capsule Take by mouth 2 (two) times daily.     cetirizine  (ZYRTEC ) 10 MG tablet Take 1 tablet (10 mg total) by mouth daily. 30 tablet 0   docusate sodium  (COLACE) 100 MG capsule Take 200-300 mg by mouth at bedtime.  ezetimibe  (ZETIA ) 10 MG tablet Take 1 tablet (10 mg total) by mouth daily. 90 tablet 3   fluticasone  (FLONASE ) 50 MCG/ACT nasal spray Place 2 sprays into both nostrils daily. 16 g 0   furosemide  (LASIX ) 20 MG tablet Take 1 tablet (20 mg total) by mouth daily. 90 tablet 3   gabapentin  (NEURONTIN ) 600 MG tablet Take 600 mg by mouth 3 (three) times daily.     iron  polysaccharides (NIFEREX) 150 MG capsule TAKE 1 CAPSULE (150 MG DOSE) BY MOUTH TWICE A DAY 180 capsule 1   losartan  (COZAAR ) 100 MG tablet Take 1 tablet (100 mg total) by mouth daily. 30 tablet 1   metFORMIN  (GLUCOPHAGE ) 1000 MG tablet Take 1 tablet (1,000 mg total) by mouth 2 (two) times daily with a meal. (Patient not taking: Reported on 05/10/2024) 180 tablet 1   metoprolol  tartrate (LOPRESSOR ) 25 MG tablet Take 1 tablet (25 mg total) by mouth 2 (two) times daily. 180 tablet 2   ondansetron  (ZOFRAN ) 8 MG tablet Take 8 mg by mouth 3 (three) times daily. (Patient not taking: Reported on 05/10/2024)     pantoprazole  (PROTONIX ) 40 MG tablet Take 40 mg by mouth daily.     polyvinyl alcohol (LIQUIFILM TEARS) 1.4 % ophthalmic solution Place 1 drop  into both eyes as needed for dry eyes.     rosuvastatin  (CRESTOR ) 10 MG tablet Take 1 tablet (10 mg total) by mouth every other day. 45 tablet 3   Semaglutide , 2 MG/DOSE, (OZEMPIC , 2 MG/DOSE,) 8 MG/3ML SOPN Inject 2 mg into the skin once a week. (Patient not taking: Reported on 05/10/2024)     VEOZAH 45 MG TABS Take 1 tablet by mouth daily.     No facility-administered medications prior to visit.     Social History   Socioeconomic History   Marital status: Married    Spouse name: Not on file   Number of children: 3   Years of education: Not on file   Highest education level: Not on file  Occupational History   Not on file  Tobacco Use   Smoking status: Never   Smokeless tobacco: Never  Vaping Use   Vaping status: Never Used  Substance and Sexual Activity   Alcohol use: No    Alcohol/week: 0.0 standard drinks of alcohol   Drug use: No   Sexual activity: Yes  Other Topics Concern   Not on file  Social History Narrative   Not on file   Social Drivers of Health   Tobacco Use: Low Risk (05/10/2024)   Patient History    Smoking Tobacco Use: Never    Smokeless Tobacco Use: Never    Passive Exposure: Not on file  Financial Resource Strain: Low Risk (01/20/2024)   Received from [facility]   Overall Financial Resource Strain (CARDIA)    How hard is it for you to pay for the very basics like food, housing, medical care, and heating?: Not hard at all  Food Insecurity: No Food Insecurity (01/20/2024)   Received from [facility]   Epic    Within the past 12 months, you worried that your food would run out before you got the money to buy more.: Never true    Within the past 12 months, the food you bought just didn't last and you didn't have money to get more.: Never true  Transportation Needs: No Transportation Needs (01/20/2024)   Received from [facility]    In the past 12 months, has  lack of transportation kept you from medical appointments or from getting  medications?: No    In the past 12 months, has lack of transportation kept you from meetings, work, or from getting things needed for daily living?: No  Physical Activity: Insufficiently Active (01/20/2024)   Received from [facility]   Exercise Vital Sign    On average, how many days per week do you engage in moderate to strenuous exercise (like a brisk walk)?: 3 days    On average, how many minutes do you engage in exercise at this level?: 30 min  Stress: No Stress Concern Present (01/20/2024)   Received from [facility] - Occupational Stress Questionnaire    Do you feel stress - tense, restless, nervous, or anxious, or unable to sleep at night because your mind is troubled all the time - these days?: Not at all  Social Connections: Socially Integrated (01/20/2024)   Received from [facility]   Social Network    How would you rate your social network (family, work, friends)?: Good participation with social networks  Intimate Partner Violence: Not At Risk (01/20/2024)   Received from [facility]   HITS    Over the last 12 months how often did your partner physically hurt you?: Never    Over the last 12 months how often did your partner insult you or talk down to you?: Never    Over the last 12 months how often did your partner threaten you with physical harm?: Never    Over the last 12 months how often did your partner scream or curse at you?: Never  Depression (PHQ2-9): Low Risk (12/07/2023)   Depression (PHQ2-9)    PHQ-2 Score: 2  Alcohol Screen: Not on file  Housing: Low Risk (01/20/2024)   Received from [facility]    In the last 12 months, was there a time when you were not able to pay the mortgage or rent on time?: No    In the past 12 months, how many times have you moved where you were living?: 0    At any time in the past 12 months, were you homeless or living in a shelter (including now)?: No  Utilities: Not At Risk  (01/20/2024)   Received from [facility]    In the past 12 months has the electric, gas, oil, or water company threatened to shut off services in your home?: No  Health Literacy: Not on file      Review of Systems    All other ros negative Objective:    Wt 202 lb (91.6 kg)   BMI 42.22 kg/m  Nursing note and vital signs reviewed.  Physical Exam  General/constitutional: no distress, pleasant; walks with cane HEENT: Normocephalic, PER, Conj Clear, EOMI, Oropharynx clear Neck supple CV: rrr no mrg Lungs: clear to auscultation, normal respiratory effort Abd: Soft, Nontender Ext: facial plethora; bilateral LE trace edema Skin: No Rash Neuro: nonfocal MSK: no peripheral joint swelling/tenderness/warmth; back spines nontender      Labs: Lab Results  Component Value Date   WBC 6.4 12/29/2023   HGB 11.6 (L) 12/29/2023   HCT 38.4 12/29/2023   MCV 77.9 (L) 12/29/2023   PLT 228 12/29/2023   Last metabolic panel Lab Results  Component Value Date   GLUCOSE 98 01/11/2024   NA 142 01/11/2024   K 3.6 01/11/2024   CL 100 01/11/2024   CO2 26 01/11/2024  BUN 20 01/11/2024   CREATININE 1.14 (H) 01/11/2024   GFRNONAA 43 (L) 07/01/2023   CALCIUM  9.9 01/11/2024   PHOS 3.7 06/17/2023   PROT 7.3 08/09/2023   ALBUMIN  3.8 06/14/2023   LABGLOB 2.7 02/03/2023   AGRATIO 1.7 08/19/2017   BILITOT 0.2 08/09/2023   ALKPHOS 69 06/14/2023   AST 19 08/09/2023   ALT 18 08/09/2023   ANIONGAP 11 07/01/2023   Crp:    Component Value Date/Time   CRP 24.1 (H) 12/29/2023 1424   CRP 35.2 (H) 11/01/2023 1136   CRP 31.1 (H) 08/09/2023 1119    08/03/22     26.3   (<8) 04/2022    20.8   (<8)   Micro:  Serology:  Imaging: Reviewed  03/31/22 mri lumbar spine 1. Postoperative changes since the prior MRI. No findings suspicious for persistent discitis, epidural abscess or paraspinal abscess. 2. New marrow signal abnormality in the L2 and L3 vertebral bodies and subsequent  enhancement but no enhancement in the disc space to suggest persistent discitis. This could be remodeling changes related to the recent surgery and new hardware. 3. Some residual prevertebral inflammatory changes but no rim enhancing abscess.   11/10/23 mri lumbar spine without contrast IMPRESSION: 1. No evidence of ongoing infection or acute fracture. 2. Stable grade 1 anterolisthesis at L3-4. 3. Posterior spinal hardware at L2, L4, L5, and S1 bilaterally with superior subluxation of pedicle screws bilaterally.  Assessment & Plan:   Problem List Items Addressed This Visit   None       No orders of the defined types were placed in this encounter.    #Post-op lumbar infection SP I&D and HW removal with Cx+ Proteus mirabilis  on 02/05/22; and placement of new screws into infected spine #Psoas abscess #Proteus mirabilis bacteremia  -02/03/22 blood cultures 1/2 Proteus mirabilis.  Initially started on vancomycin , cefepime , metronidazole . - initial MRI L-spine showed epidural abscess extending anteriorly from soft tissue to fusion hardware L3-L4. 1.1x0.8 cm focal area in right psoas muscle suspicious for small abscess - Underwent I&D with hardware removal on 9/15 with neurosurgery Dr. Gillie.  Of note all the old hardware was removed new pedicle screws were placed.  ID was engaged and plan for 8 weeks of antibiotics more.  OR cultures grew Proteus mirabilis with cefazolin  MIC 8  as such switched  cefazolin  to ceftriaxone .   - s/p 8 weeks ceftriaxone  by 11/10, switched to bactrim  ss bid chronically  05/05/22 id assessment Repeat mri lumbar spine 03/31/22 showed no further abscess Clinically also improving   08/03/22 id clinic assessment Doing very well Discuss with her trial off abx Labs today and f/u 6-8 weeks when she'll decide on abx continuation  10/26/22 id clinic assessment Due to still elevated crp without obvious explanation, and despite minimal back pain, will keep going  with bactrim  for now until we have 2 normal at least 6 months apart She would like more zofran  amount in hand for her nausea with bactrim  Labs today for toxicity monitoring and crp monitoring Follow up 6 months Continue bactrim  1 ds tablet twice a day  04/26/23 id clinic assessment Back seems to be doing well AS awaiting tavr -- her infection is suppressed and shouldn't be a contraindication for tavr if needed Labs today Will revisit trial off abx at the appropriate time (after tavr and only with persistently normal crp) Continue bactrim  SS bid suppression   Advise patient to call cardiology clinic regarding recent more rapid weight gain/dyspnea.  08/09/23 id clinic assessment S/p tavr 05/2023 On eliquis  for afib Tolerating bactrim  No new sx with lower back  We discussed trial off abx for the back previously. For now given recent tavr I want to make sure she does well on that with cardiac rehab before considering trial off abx bactrim   Labs today  F/u 10 weeks   11/01/23 id clinic assessment Crp down trended and up trended 08/09/23 unclear reason Will repeat today She is 6 months out from tavr Since I spoke with her 07/2023 nothing changed. Feeling well. Started cardiac rehab. Breathing a lot better along with activity tolerance No fever, chill No back pain, however has right lateral/anterior thigh pain. She does notice some weakness in the right leg after walking a while -- stable since surgery  She uses nervive for the pain which helps -- continue as needed  If numbers crp still high will scan the back If crp goes down and normalize x2 consecutive occasion, I'll feel better taking her off medication  F/u 3 months   ----------- Addendum Rising crp/esr but stable from last visit Can't explain it Will repeat mri l-spine    11/24/23 id clinic assessment Mri spine reviewed no concern of spine active infection outside of the subluxed screws. Stable minimal pain in the lower  back  No fever chill Tolerating bactrim  suppression antibiotics; has been on abx since initial infection/surgery late 01/2022  06/2023 sed rate 35  Discuss trial off abx again today and she agrees; her creatinine is on the moderately elevated level  F/u closely for the next 3-6 months; 3 weeks from now, and will get one more set of labs at that time    05/10/24 id clinic assessment She is here for f/u proteus bsi and lumbar osteomyelitis dx'ed 01/2022 She had been on abx up to 11/2023  She is about 5 months off antibiotics and here for f/u hardware associated lumbar spine osteomyelitis 10/2023 mri unremarkable Minimal chronic pain lower back - walks with cane nothing different from 11/2023 Has had elevated crp unclear reason  Lab Results  Component Value Date   ESRSEDRATE 31 (H) 12/29/2023      Component Value Date/Time   CRP 24.1 (H) 12/29/2023 1424   CRP 35.2 (H) 11/01/2023 1136   CRP 31.1 (H) 08/09/2023 1119    Continue to clinically well though no concerning exam finding  One more set crp/esr for baseline then follow up as needed if persistent progressive pain     Follow-up: Return if symptoms worsen or fail to improve.      Constance Olivia Passer, MD [facility] 05/10/2024, 9:52 AM

## 2024-05-11 LAB — COMPLETE METABOLIC PANEL WITHOUT GFR
AG Ratio: 1.6 (calc) (ref 1.0–2.5)
ALT: 12 U/L (ref 6–29)
AST: 16 U/L (ref 10–35)
Albumin: 4.5 g/dL (ref 3.6–5.1)
Alkaline phosphatase (APISO): 84 U/L (ref 37–153)
BUN/Creatinine Ratio: 12 (calc) (ref 6–22)
BUN: 16 mg/dL (ref 7–25)
CO2: 31 mmol/L (ref 20–32)
Calcium: 9.6 mg/dL (ref 8.6–10.4)
Chloride: 104 mmol/L (ref 98–110)
Creat: 1.3 mg/dL — ABNORMAL HIGH (ref 0.50–1.05)
Globulin: 2.8 g/dL (ref 1.9–3.7)
Glucose, Bld: 93 mg/dL (ref 65–99)
Potassium: 3.6 mmol/L (ref 3.5–5.3)
Sodium: 143 mmol/L (ref 135–146)
Total Bilirubin: 0.4 mg/dL (ref 0.2–1.2)
Total Protein: 7.3 g/dL (ref 6.1–8.1)

## 2024-05-11 LAB — CBC
HCT: 36.6 % (ref 35.9–46.0)
Hemoglobin: 11.4 g/dL — ABNORMAL LOW (ref 11.7–15.5)
MCH: 23.8 pg — ABNORMAL LOW (ref 27.0–33.0)
MCHC: 31.1 g/dL — ABNORMAL LOW (ref 31.6–35.4)
MCV: 76.6 fL — ABNORMAL LOW (ref 81.4–101.7)
MPV: 11.4 fL (ref 7.5–12.5)
Platelets: 246 Thousand/uL (ref 140–400)
RBC: 4.78 Million/uL (ref 3.80–5.10)
RDW: 15.7 % — ABNORMAL HIGH (ref 11.0–15.0)
WBC: 5.3 Thousand/uL (ref 3.8–10.8)

## 2024-05-11 LAB — C-REACTIVE PROTEIN: CRP: 31.1 mg/L — ABNORMAL HIGH

## 2024-05-11 LAB — SEDIMENTATION RATE: Sed Rate: 33 mm/h — ABNORMAL HIGH (ref 0–30)

## 2024-05-18 ENCOUNTER — Other Ambulatory Visit (HOSPITAL_COMMUNITY): Payer: Self-pay

## 2024-05-22 ENCOUNTER — Telehealth: Payer: Self-pay | Admitting: Cardiovascular Disease

## 2024-05-22 NOTE — Telephone Encounter (Signed)
 Patient identification verified by 2 forms.   Called and spoke to patient  Patient states:  -Pt states SOB is not new -She called in due to extreme fatigue.  -Has been going on about a month  -Pt is averaging about 8 hours a night.   Patient denies:  -No other changes.              Interventions/Plan: -Appt made for 6 month f/u   Patient agrees with plan, no questions at this time

## 2024-05-22 NOTE — Telephone Encounter (Signed)
 Pt c/o Shortness Of Breath: STAT if SOB developed within the last 24 hours or pt is noticeably SOB on the phone  1. Are you currently SOB (can you hear that pt is SOB on the phone)? no  2. How long have you been experiencing SOB? Not sure  3. Are you SOB when sitting or when up moving around? Moving around  4. Are you currently experiencing any other symptoms? Feeling very fatigue

## 2024-06-05 ENCOUNTER — Ambulatory Visit
Admission: EM | Admit: 2024-06-05 | Discharge: 2024-06-05 | Disposition: A | Discharge status: Home / Self Care | Attending: Family Medicine | Admitting: Family Medicine

## 2024-06-05 DIAGNOSIS — M109 Gout, unspecified: Secondary | ICD-10-CM

## 2024-06-05 DIAGNOSIS — M79645 Pain in left finger(s): Secondary | ICD-10-CM | POA: Diagnosis not present

## 2024-06-05 MED ORDER — PREDNISONE 10 MG PO TABS: 30.0000 mg | ORAL_TABLET | Freq: Every day | ORAL | 0 refills | Status: AC

## 2024-06-05 NOTE — ED Triage Notes (Signed)
 Pt states that her left middle finger is swollen. X3-4 days  Pt denies any known injury.  Pt states that she has taken Ibuprofen for the pain.

## 2024-06-05 NOTE — ED Provider Notes (Signed)
 " [facility]  Note:  This document was prepared using Conservation officer, historic buildings and may include unintentional dictation errors.  MRN: [id number] DOB: [birth year]  Subjective:   Olivia  M [name] is a 69 y.o. female presenting for 3 days history of acute onset left middle finger pain over her distal finger.  Has had swelling, redness.  Has a history of gout.  Last episode was about 3 weeks ago.  No fever, drainage of pus or bleeding.  Patient does not practice a low purine diet.  She does take Lasix .  Has a history of congestive heart failure with preserved ejection fraction.  Last echocardiogram was 07/26/2023, had an EF of greater than 75%.  Previously had an episode while using steroids and had to undergo diuresis February 2025.  Patient has a history of elevated creatinine levels, would like to avoid medications that affect her kidneys.  Last creatinine level was 1.30 mg/dL from 87/81/7974.  Current Outpatient Medications  Medication Instructions   acetaminophen  (TYLENOL ) 1,000 mg, Every 6 hours PRN   albuterol  (PROAIR  HFA) 108 (90 BASE) MCG/ACT inhaler 2 puffs, Inhalation, Every 6 hours PRN   amiodarone  (PACERONE ) 100 mg, Oral, Daily   amLODipine  (NORVASC ) 10 mg, Oral, Daily   amoxicillin  (AMOXIL ) 500 MG tablet Take 4 tablets (2000 mg) by mouth one hour prior to dental procedure.   ascorbic acid (VITAMIN C) 500 mg, Daily   aspirin  EC 81 mg, Oral, Daily, Swallow whole.   celecoxib  (CELEBREX ) 200 MG capsule 2 times daily   cetirizine  (ZYRTEC ) 10 mg, Oral, Daily   docusate sodium  (COLACE) 200-300 mg, Nightly   Eliquis  5 mg, Oral, 2 times daily   ezetimibe  (ZETIA ) 10 mg, Oral, Daily   fluticasone  (FLONASE ) 50 MCG/ACT nasal spray 2 sprays, Each Nare, Daily   furosemide  (LASIX ) 20 mg, Oral, Daily   gabapentin  (NEURONTIN ) 600 mg, 3 times daily   iron  polysaccharides (NIFEREX) 150 MG capsule TAKE 1 CAPSULE (150 MG DOSE) BY MOUTH TWICE A DAY   losartan  (COZAAR )  100 mg, Oral, Daily   [Paused] metFORMIN  (GLUCOPHAGE ) 1,000 mg, Oral, 2 times daily with meals   metoprolol  tartrate (LOPRESSOR ) 25 mg, Oral, 2 times daily   Mounjaro  10 mg   ondansetron  (ZOFRAN ) 8 mg, 3 times daily   pantoprazole  (PROTONIX ) 40 mg, Daily   polyvinyl alcohol (LIQUIFILM TEARS) 1.4 % ophthalmic solution 1 drop, As needed   rosuvastatin  (CRESTOR ) 10 mg, Oral, Every other day   VEOZAH 45 MG TABS 1 tablet, Daily    Allergies[1]  Past Medical History:  Diagnosis Date   Allergy    Anemia    Anxiety    Arthritis    Asthma    BMI 40.0-44.9, adult (HCC) 04/07/2014   DM type 2 (diabetes mellitus, type 2) (HCC)    GERD (gastroesophageal reflux disease)    HTN (hypertension)    Hypercholesterolemia    Osteoarthritis of left hip 04/07/2014   Reflux    Severe aortic stenosis    Spinal headache    with C-Section and with spinal fusion in 2019     Past Surgical History:  Procedure Laterality Date   ASCENDING AORTIC ROOT REPLACEMENT N/A 06/14/2023   Procedure: ASCENDING AORTIC ROOT REPLACEMENT USING KONECT RESILIA AORTIC VALVE CONDUIT SIZE AND REATTACHMENT OF RIGHT AND LEFT CORONARY;  Surgeon: Maryjane Mt, MD;  Location: [facility] OR;  Service: Open Heart Surgery;  Laterality: N/A;   BACK SURGERY  2023   lumbar fusion, got infection,  then had to re-do fusion   CESAREAN SECTION     x3   COLONOSCOPY     LAMINECTOMY  03/2021   LEFT HEART CATH AND CORONARY ANGIOGRAPHY N/A 04/06/2023   Procedure: LEFT HEART CATH AND CORONARY ANGIOGRAPHY;  Surgeon: Wonda Sharper, MD;  Location: [facility] INVASIVE CV LAB;  Service: Cardiovascular;  Laterality: N/A;   SPINAL FUSION  2019   TEE WITHOUT CARDIOVERSION N/A 06/14/2023   Procedure: TRANSESOPHAGEAL ECHOCARDIOGRAM (TEE);  Surgeon: Maryjane Mt, MD;  Location: [facility] OR;  Service: Open Heart Surgery;  Laterality: N/A;   TUBAL LIGATION      Family History  Problem Relation Age of Onset   Hypertension Mother    Stroke Mother    Heart disease  Father    Multiple sclerosis Sister    Stroke Brother    Multiple sclerosis Brother    Colon cancer Neg Hx    Colon polyps Neg Hx    Esophageal cancer Neg Hx    Rectal cancer Neg Hx    Stomach cancer Neg Hx    Breast cancer Neg Hx     Social History   Occupational History   Not on file  Tobacco Use   Smoking status: Never   Smokeless tobacco: Never  Vaping Use   Vaping status: Never Used  Substance and Sexual Activity   Alcohol use: No    Alcohol/week: 0.0 standard drinks of alcohol   Drug use: No   Sexual activity: Yes    ROS   Objective:   Vitals: BP (!) 167/88 (BP Location: Right Arm)   Pulse 83   Temp 98.3 F (36.8 C) (Oral)   Resp 17   Ht 4' 10 (1.473 m)   Wt 195 lb (88.5 kg)   SpO2 95%   BMI 40.76 kg/m   Physical Exam Constitutional:      General: She is not in acute distress.    Appearance: Normal appearance. She is well-developed. She is not ill-appearing, toxic-appearing or diaphoretic.  HENT:     Head: Normocephalic and atraumatic.     Nose: Nose normal.     Mouth/Throat:     Mouth: Mucous membranes are moist.  Eyes:     General: No scleral icterus.       Right eye: No discharge.        Left eye: No discharge.     Extraocular Movements: Extraocular movements intact.  Cardiovascular:     Rate and Rhythm: Normal rate.  Pulmonary:     Effort: Pulmonary effort is normal.  Musculoskeletal:     Left hand: Swelling, tenderness (over area outlined) and bony tenderness present. No deformity or lacerations. Normal range of motion. Normal strength. Normal sensation. Normal capillary refill.       Hands:     Comments: No signs of paronychia.  Skin:    General: Skin is warm and dry.  Neurological:     General: No focal deficit present.     Mental Status: She is alert and oriented to person, place, and time.  Psychiatric:        Mood and Affect: Mood normal.        Behavior: Behavior normal.     Assessment and Plan :   PDMP not reviewed  this encounter.  1. Acute gout of left hand, unspecified cause   2. Finger pain, left      Given her history of elevated creatinine levels, patient's preference to avoid medications that would affect her kidneys I  recommended oral prednisone  at 30 mg only for 5 days.  This is a risk for congestive heart failure but she does have a preserved ejection fraction.  Recommended she follow-up with her cardiologist ASAP, practice a low purine diet, discussed the use of diuretic as a possible source of her gout.  Counseled patient on potential for adverse effects with medications prescribed/recommended today, ER and return-to-clinic precautions discussed, patient verbalized understanding.     [1]  Allergies Allergen Reactions   Crestor  [Rosuvastatin ] Other (See Comments)    Myalgia; patient can only tolerate taking 10 mg every other day   Robaxin  [Methocarbamol ] Other (See Comments)    Insomnia   2,4-D Dimethylamine    Prednisone  Other (See Comments)    Messes with kidneys.   Toradol  [Ketorolac  Tromethamine ] Other (See Comments)   Zocor [Simvastatin] Other (See Comments)    Myalgias    Lipitor [Atorvastatin ] Other (See Comments)    Myalgia   Sulfa  Antibiotics Hives and Nausea And Vomiting     Christopher Savannah, PA-C 06/05/24 1102  "

## 2024-06-09 ENCOUNTER — Ambulatory Visit
Admission: EM | Admit: 2024-06-09 | Discharge: 2024-06-09 | Disposition: A | Discharge status: Home / Self Care | Attending: Family Medicine | Admitting: Family Medicine

## 2024-06-09 DIAGNOSIS — L03115 Cellulitis of right lower limb: Secondary | ICD-10-CM

## 2024-06-09 MED ORDER — DOXYCYCLINE HYCLATE 100 MG PO CAPS: 100.0000 mg | ORAL_CAPSULE | Freq: Two times a day (BID) | ORAL | 0 refills | Status: AC

## 2024-06-09 NOTE — ED Provider Notes (Signed)
 " [facility], Television/film/video - [facility]  Note:  This document was prepared using Conservation officer, historic buildings and may include unintentional dictation errors.  MRN: [id number] DOB: [birth year]  Subjective:   Olivia  CHRISTELLA [name] is a 69 y.o. female presenting for 1 day history of right foot pain.  No fall, trauma, wounds, drainage of pus or bleeding, fever.  Was last seen 06/05/2024 for acute gout of the left middle finger.  Was started on prednisone .  She is taking medication as prescribed and notes significant improvement.  Has a history of congestive heart failure with preserved ejection fraction.  Last echocardiogram was 07/26/2023, had an EF of greater than 75%.  Previously had an episode while using steroids and had to undergo diuresis February 2025.  Patient has a history of elevated creatinine levels, would like to avoid medications that affect her kidneys.  Last creatinine level was 1.30 mg/dL from 87/81/7974.   Current Outpatient Medications  Medication Instructions   acetaminophen  (TYLENOL ) 1,000 mg, Every 6 hours PRN   albuterol  (PROAIR  HFA) 108 (90 BASE) MCG/ACT inhaler 2 puffs, Inhalation, Every 6 hours PRN   amiodarone  (PACERONE ) 100 mg, Oral, Daily   amLODipine  (NORVASC ) 10 mg, Oral, Daily   amoxicillin  (AMOXIL ) 500 MG tablet Take 4 tablets (2000 mg) by mouth one hour prior to dental procedure.   ascorbic acid (VITAMIN C) 500 mg, Daily   aspirin  EC 81 mg, Oral, Daily, Swallow whole.   celecoxib  (CELEBREX ) 200 MG capsule 2 times daily   cetirizine  (ZYRTEC ) 10 mg, Oral, Daily   docusate sodium  (COLACE) 200-300 mg, Nightly   Eliquis  5 mg, Oral, 2 times daily   ezetimibe  (ZETIA ) 10 mg, Oral, Daily   fluticasone  (FLONASE ) 50 MCG/ACT nasal spray 2 sprays, Each Nare, Daily   furosemide  (LASIX ) 20 mg, Oral, Daily   gabapentin  (NEURONTIN ) 600 mg, 3 times daily   iron  polysaccharides (NIFEREX) 150 MG capsule TAKE 1 CAPSULE (150 MG DOSE) BY MOUTH TWICE A DAY   losartan  (COZAAR ) 100 mg,  Oral, Daily   [Paused] metFORMIN  (GLUCOPHAGE ) 1,000 mg, Oral, 2 times daily with meals   metoprolol  tartrate (LOPRESSOR ) 25 mg, Oral, 2 times daily   Mounjaro  10 mg   ondansetron  (ZOFRAN ) 8 mg, 3 times daily   pantoprazole  (PROTONIX ) 40 mg, Daily   polyvinyl alcohol (LIQUIFILM TEARS) 1.4 % ophthalmic solution 1 drop, As needed   predniSONE  (DELTASONE ) 30 mg, Oral, Daily with breakfast   rosuvastatin  (CRESTOR ) 10 mg, Oral, Every other day   VEOZAH 45 MG TABS 1 tablet, Daily    Allergies[1]  Past Medical History:  Diagnosis Date   Allergy    Anemia    Anxiety    Arthritis    Asthma    BMI 40.0-44.9, adult (HCC) 04/07/2014   DM type 2 (diabetes mellitus, type 2) (HCC)    GERD (gastroesophageal reflux disease)    HTN (hypertension)    Hypercholesterolemia    Osteoarthritis of left hip 04/07/2014   Reflux    Severe aortic stenosis    Spinal headache    with C-Section and with spinal fusion in 2019     Past Surgical History:  Procedure Laterality Date   ASCENDING AORTIC ROOT REPLACEMENT N/A 06/14/2023   Procedure: ASCENDING AORTIC ROOT REPLACEMENT USING KONECT RESILIA AORTIC VALVE CONDUIT SIZE AND REATTACHMENT OF RIGHT AND LEFT CORONARY;  Surgeon: Maryjane Mt, MD;  Location: [facility] OR;  Service: Open Heart Surgery;  Laterality: N/A;   BACK SURGERY  2023   lumbar  fusion, got infection, then had to re-do fusion   CESAREAN SECTION     x3   COLONOSCOPY     LAMINECTOMY  03/2021   LEFT HEART CATH AND CORONARY ANGIOGRAPHY N/A 04/06/2023   Procedure: LEFT HEART CATH AND CORONARY ANGIOGRAPHY;  Surgeon: Wonda Sharper, MD;  Location: [facility] INVASIVE CV LAB;  Service: Cardiovascular;  Laterality: N/A;   SPINAL FUSION  2019   TEE WITHOUT CARDIOVERSION N/A 06/14/2023   Procedure: TRANSESOPHAGEAL ECHOCARDIOGRAM (TEE);  Surgeon: Maryjane Mt, MD;  Location: [facility] OR;  Service: Open Heart Surgery;  Laterality: N/A;   TUBAL LIGATION      Family History  Problem Relation Age of Onset    Hypertension Mother    Stroke Mother    Heart disease Father    Multiple sclerosis Sister    Stroke Brother    Multiple sclerosis Brother    Colon cancer Neg Hx    Colon polyps Neg Hx    Esophageal cancer Neg Hx    Rectal cancer Neg Hx    Stomach cancer Neg Hx    Breast cancer Neg Hx     Social History   Occupational History   Not on file  Tobacco Use   Smoking status: Never   Smokeless tobacco: Never  Vaping Use   Vaping status: Never Used  Substance and Sexual Activity   Alcohol use: No    Alcohol/week: 0.0 standard drinks of alcohol   Drug use: No   Sexual activity: Yes     ROS   Objective:   Vitals: BP (!) 163/83 (BP Location: Right Arm)   Pulse 80   Temp 98.2 F (36.8 C) (Oral)   Resp 20   SpO2 95%   BP Readings from Last 3 Encounters:  06/09/24 (!) 163/83  06/05/24 (!) 167/88  05/10/24 (!) 145/76    Physical Exam Constitutional:      General: She is not in acute distress.    Appearance: Normal appearance. She is well-developed. She is not ill-appearing, toxic-appearing or diaphoretic.  HENT:     Head: Normocephalic and atraumatic.     Nose: Nose normal.     Mouth/Throat:     Mouth: Mucous membranes are moist.  Eyes:     General: No scleral icterus.       Right eye: No discharge.        Left eye: No discharge.     Extraocular Movements: Extraocular movements intact.  Cardiovascular:     Rate and Rhythm: Normal rate.  Pulmonary:     Effort: Pulmonary effort is normal.  Musculoskeletal:       Feet:     Comments: Tenderness with warmth, trace swelling and erythema along the area outlined.  Skin:    General: Skin is warm and dry.  Neurological:     General: No focal deficit present.     Mental Status: She is alert and oriented to person, place, and time.  Psychiatric:        Mood and Affect: Mood normal.        Behavior: Behavior normal.     Assessment and Plan :   PDMP not reviewed this encounter.  1. Cellulitis of right foot       Offered imaging but patient declined for now.  This is reasonable given lack of trauma.  Recommended doxycycline  to address cellulitis as she is currently on prednisone  and developed the symptoms yesterday anyway.  Advised that gout flare should have been addressed with this.  Recommend maintaining a low purine diet, low glycemic index foods.  Counseled patient on potential for adverse effects with medications prescribed/recommended today, ER and return-to-clinic precautions discussed, patient verbalized understanding.     [1]  Allergies Allergen Reactions   Crestor  [Rosuvastatin ] Other (See Comments)    Myalgia; patient can only tolerate taking 10 mg every other day   Robaxin  [Methocarbamol ] Other (See Comments)    Insomnia   2,4-D Dimethylamine    Prednisone  Other (See Comments)    Messes with kidneys.   Toradol  [Ketorolac  Tromethamine ] Other (See Comments)   Zocor [Simvastatin] Other (See Comments)    Myalgias    Lipitor [Atorvastatin ] Other (See Comments)    Myalgia   Sulfa  Antibiotics Hives and Nausea And Vomiting     Christopher Savannah, PA-C 06/09/24 1202  "

## 2024-06-09 NOTE — ED Triage Notes (Signed)
 Pt c/o pain to right foot started yesterday-denies injury-taking rx med form recent visit and tylenol -NAD-walking with own rollator

## 2024-06-10 ENCOUNTER — Emergency Department (HOSPITAL_BASED_OUTPATIENT_CLINIC_OR_DEPARTMENT_OTHER)

## 2024-06-10 ENCOUNTER — Other Ambulatory Visit: Payer: Self-pay

## 2024-06-10 ENCOUNTER — Encounter (HOSPITAL_BASED_OUTPATIENT_CLINIC_OR_DEPARTMENT_OTHER): Payer: Self-pay

## 2024-06-10 ENCOUNTER — Emergency Department (HOSPITAL_BASED_OUTPATIENT_CLINIC_OR_DEPARTMENT_OTHER): Admission: EM | Admit: 2024-06-10 | Discharge: 2024-06-10 | Disposition: A | Discharge status: Home / Self Care

## 2024-06-10 DIAGNOSIS — Z79899 Other long term (current) drug therapy: Secondary | ICD-10-CM | POA: Diagnosis not present

## 2024-06-10 DIAGNOSIS — E119 Type 2 diabetes mellitus without complications: Secondary | ICD-10-CM | POA: Diagnosis not present

## 2024-06-10 DIAGNOSIS — I10 Essential (primary) hypertension: Secondary | ICD-10-CM | POA: Insufficient documentation

## 2024-06-10 DIAGNOSIS — M79671 Pain in right foot: Secondary | ICD-10-CM | POA: Insufficient documentation

## 2024-06-10 DIAGNOSIS — Z7901 Long term (current) use of anticoagulants: Secondary | ICD-10-CM | POA: Diagnosis not present

## 2024-06-10 DIAGNOSIS — Z7982 Long term (current) use of aspirin: Secondary | ICD-10-CM | POA: Insufficient documentation

## 2024-06-10 LAB — CBC WITH DIFFERENTIAL/PLATELET
Abs Immature Granulocytes: 0.52 K/uL — ABNORMAL HIGH (ref 0.00–0.07)
Basophils Absolute: 0.1 K/uL (ref 0.0–0.1)
Basophils Relative: 1 %
Eosinophils Absolute: 0.1 K/uL (ref 0.0–0.5)
Eosinophils Relative: 1 %
HCT: 36.2 % (ref 36.0–46.0)
Hemoglobin: 11.3 g/dL — ABNORMAL LOW (ref 12.0–15.0)
Immature Granulocytes: 4 %
Lymphocytes Relative: 26 %
Lymphs Abs: 3.1 K/uL (ref 0.7–4.0)
MCH: 24 pg — ABNORMAL LOW (ref 26.0–34.0)
MCHC: 31.2 g/dL (ref 30.0–36.0)
MCV: 77 fL — ABNORMAL LOW (ref 80.0–100.0)
Monocytes Absolute: 1 K/uL (ref 0.1–1.0)
Monocytes Relative: 9 %
Neutro Abs: 7.2 K/uL (ref 1.7–7.7)
Neutrophils Relative %: 59 %
Platelets: 275 K/uL (ref 150–400)
RBC: 4.7 MIL/uL (ref 3.87–5.11)
RDW: 17.6 % — ABNORMAL HIGH (ref 11.5–15.5)
WBC: 12 K/uL — ABNORMAL HIGH (ref 4.0–10.5)
nRBC: 0.2 % (ref 0.0–0.2)

## 2024-06-10 LAB — BASIC METABOLIC PANEL WITH GFR
Anion gap: 13 (ref 5–15)
BUN: 21 mg/dL (ref 8–23)
CO2: 27 mmol/L (ref 22–32)
Calcium: 10.2 mg/dL (ref 8.9–10.3)
Chloride: 101 mmol/L (ref 98–111)
Creatinine, Ser: 1.21 mg/dL — ABNORMAL HIGH (ref 0.44–1.00)
GFR, Estimated: 49 mL/min — ABNORMAL LOW
Glucose, Bld: 114 mg/dL — ABNORMAL HIGH (ref 70–99)
Potassium: 3.4 mmol/L — ABNORMAL LOW (ref 3.5–5.1)
Sodium: 141 mmol/L (ref 135–145)

## 2024-06-10 MED ORDER — OXYCODONE-ACETAMINOPHEN 5-325 MG PO TABS: 1.0000 | ORAL_TABLET | Freq: Four times a day (QID) | ORAL | 0 refills | Status: AC | PRN

## 2024-06-10 MED ORDER — METHYLPREDNISOLONE SODIUM SUCC 125 MG IJ SOLR
125.0000 mg | Freq: Once | INTRAMUSCULAR | Status: AC
  Administered 2024-06-10: 125 mg via INTRAVENOUS
  Filled 2024-06-10: qty 2

## 2024-06-10 MED ORDER — SODIUM CHLORIDE 0.9 % IV SOLN
2.0000 g | Freq: Once | INTRAVENOUS | Status: AC
  Administered 2024-06-10: 2 g via INTRAVENOUS
  Filled 2024-06-10: qty 20

## 2024-06-10 MED ORDER — CEPHALEXIN 500 MG PO CAPS: 500.0000 mg | ORAL_CAPSULE | Freq: Four times a day (QID) | ORAL | 0 refills | Status: AC

## 2024-06-10 MED ORDER — OXYCODONE-ACETAMINOPHEN 5-325 MG PO TABS
1.0000 | ORAL_TABLET | Freq: Once | ORAL | Status: AC
  Administered 2024-06-10: 1 via ORAL
  Filled 2024-06-10: qty 1

## 2024-06-10 MED ORDER — PREDNISONE 10 MG (21) PO TBPK: ORAL_TABLET | Freq: Every day | ORAL | 0 refills | Status: AC

## 2024-06-10 NOTE — ED Provider Notes (Signed)
 " [place] EMERGENCY DEPARTMENT AT [facility] Provider Note   CSN: [id number] Arrival date & time: 06/10/24  1250     Patient presents with: Foot Pain   [name] is a 69 y.o. female.   69 year old female with past medical history of diabetes, hypertension, and hyperlipidemia presenting to the emergency department today with concern for right foot pain and redness.  The patient reports that this has gotten worse over the past few days.  Went to urgent care 2 days ago and was started on doxycycline .  She reports that she has been taking this and the pain has persisted/gotten worse.  She states that she was having some issues with gout in her left index finger.  She was on 30 mg of prednisone  a day.  Her finger was getting better but started pain in the right foot.  She reports she has noticed some redness over the top of her foot as well.  Denies any fevers.  She came to the ER today for further evaluation regarding this.   Foot Pain       Prior to Admission medications  Medication Sig Start Date End Date Taking? Authorizing Provider  cephALEXin  (KEFLEX ) 500 MG capsule Take 1 capsule (500 mg total) by mouth 4 (four) times daily. 06/10/24  Yes Ula Prentice SAUNDERS, MD  oxyCODONE -acetaminophen  (PERCOCET/ROXICET) 5-325 MG tablet Take 1 tablet by mouth every 6 (six) hours as needed for severe pain (pain score 7-10). 06/10/24  Yes Ula Prentice SAUNDERS, MD  predniSONE  (STERAPRED UNI-PAK 21 TAB) 10 MG (21) TBPK tablet Take by mouth daily. Take 6 tabs by mouth daily  for 2 days, then 5 tabs for 2 days, then 4 tabs for 2 days, then 3 tabs for 2 days, 2 tabs for 2 days, then 1 tab by mouth daily for 2 days 06/10/24  Yes Ula Prentice SAUNDERS, MD  acetaminophen  (TYLENOL ) 500 MG tablet Take 1,000 mg by mouth every 6 (six) hours as needed for mild pain (pain score 1-3) or headache.    [provider]  albuterol  (PROAIR  HFA) 108 (90 BASE) MCG/ACT inhaler Inhale 2 puffs into the lungs every 6 (six)  hours as needed. 07/21/13   Lenon Juliane RAMAN, MD  amiodarone  (PACERONE ) 200 MG tablet Take 0.5 tablets (100 mg total) by mouth daily. 05/03/24   Delford Maude BROCKS, MD  amLODipine  (NORVASC ) 10 MG tablet Take 1 tablet (10 mg total) by mouth daily. 11/23/17   Christopher Savannah, PA-C  apixaban  (ELIQUIS ) 5 MG TABS tablet Take 1 tablet (5 mg total) by mouth 2 (two) times daily. 01/30/24   Nishan, Peter C, MD  ascorbic acid (VITAMIN C) 500 MG tablet Take 500 mg by mouth daily.    [provider]  aspirin  EC 81 MG tablet Take 1 tablet (81 mg total) by mouth daily. Swallow whole. 07/15/23   Duke, Jon Garre, PA  celecoxib  (CELEBREX ) 200 MG capsule Take by mouth 2 (two) times daily. 10/26/23   [provider]  cetirizine  (ZYRTEC ) 10 MG tablet Take 1 tablet (10 mg total) by mouth daily. 10/09/22   Allwardt, Mardy CHRISTELLA, PA-C  docusate sodium  (COLACE) 100 MG capsule Take 200-300 mg by mouth at bedtime.    [provider]  doxycycline  (VIBRAMYCIN ) 100 MG capsule Take 1 capsule (100 mg total) by mouth 2 (two) times daily. 06/09/24   Christopher Savannah, PA-C  ezetimibe  (ZETIA ) 10 MG tablet Take 1 tablet (10 mg total) by mouth daily. 10/20/23   Nishan,  Maude BROCKS, MD  fluticasone  (FLONASE ) 50 MCG/ACT nasal spray Place 2 sprays into both nostrils daily. 10/09/22   Allwardt, Alyssa M, PA-C  furosemide  (LASIX ) 20 MG tablet Take 1 tablet (20 mg total) by mouth daily. 01/11/24 05/10/24  Lucien Orren SAILOR, PA-C  gabapentin  (NEURONTIN ) 600 MG tablet Take 600 mg by mouth 3 (three) times daily. 12/01/21   [provider]  iron  polysaccharides (NIFEREX) 150 MG capsule TAKE 1 CAPSULE (150 MG DOSE) BY MOUTH TWICE A DAY 12/19/19 05/10/24  Arby Lyle LABOR, NP  losartan  (COZAAR ) 100 MG tablet Take 1 tablet (100 mg total) by mouth daily. 06/24/23   Gold, Wayne E, PA-C  [Paused] metFORMIN  (GLUCOPHAGE ) 1000 MG tablet Take 1 tablet (1,000 mg total) by mouth 2 (two) times daily with a meal. Wait to take this until your doctor or  other care provider tells you to start again. 05/01/21     metoprolol  tartrate (LOPRESSOR ) 25 MG tablet Take 1 tablet (25 mg total) by mouth 2 (two) times daily. 05/03/24   Nishan, Peter C, MD  MOUNJARO  10 MG/0.5ML Pen Inject 10 mg into the skin. 04/24/24   [provider]  ondansetron  (ZOFRAN ) 8 MG tablet Take 8 mg by mouth 3 (three) times daily. 06/02/23   [provider]  pantoprazole  (PROTONIX ) 40 MG tablet Take 40 mg by mouth daily. 12/28/21   [provider]  polyvinyl alcohol (LIQUIFILM TEARS) 1.4 % ophthalmic solution Place 1 drop into both eyes as needed for dry eyes.    [provider]  predniSONE  (DELTASONE ) 10 MG tablet Take 3 tablets (30 mg total) by mouth daily with breakfast. 06/05/24   Christopher Savannah, PA-C  rosuvastatin  (CRESTOR ) 10 MG tablet Take 1 tablet (10 mg total) by mouth every other day. 03/22/24   Lucien Orren N, PA-C  VEOZAH 45 MG TABS Take 1 tablet by mouth daily.    [provider]    Allergies: Crestor  [rosuvastatin ]; Robaxin  [methocarbamol ]; 2,4-d dimethylamine; Prednisone ; Toradol  [ketorolac  tromethamine ]; Zocor [simvastatin]; Lipitor [atorvastatin ]; and Sulfa  antibiotics    Review of Systems  Musculoskeletal:  Positive for arthralgias.  All other systems reviewed and are negative.   Updated Vital Signs BP (!) 127/92 (BP Location: Right Arm)   Pulse 82   Temp 97.8 F (36.6 C) (Oral)   Resp 16   Ht 4' 10 (1.473 m)   Wt 88 kg   SpO2 96%   BMI 40.55 kg/m   Physical Exam Vitals and nursing note reviewed.   Gen: NAD Eyes: PERRL, EOMI HEENT: no oropharyngeal swelling Neck: trachea midline Resp: clear to auscultation bilaterally Card: RRR, no murmurs, rubs, or gallops Abd: nontender, nondistended Extremities: Erythema noted over the dorsal aspect of the right foot with some focal redness over the first MTP Vascular: 2+ radial pulses bilaterally, 2+ DP pulses bilaterally Skin: no rashes Psyc: acting  appropriately   (all labs ordered are listed, but only abnormal results are displayed) Labs Reviewed  CBC WITH DIFFERENTIAL/PLATELET - Abnormal; Notable for the following components:      Result Value   WBC 12.0 (*)    Hemoglobin 11.3 (*)    MCV 77.0 (*)    MCH 24.0 (*)    RDW 17.6 (*)    Abs Immature Granulocytes 0.52 (*)    All other components within normal limits  BASIC METABOLIC PANEL WITH GFR - Abnormal; Notable for the following components:   Potassium 3.4 (*)    Glucose, Bld 114 (*)    Creatinine,  Ser 1.21 (*)    GFR, Estimated 49 (*)    All other components within normal limits  CULTURE, BLOOD (ROUTINE X 2)  CULTURE, BLOOD (ROUTINE X 2)    EKG: None  Radiology: DG Foot Complete Right Result Date: 06/10/2024 EXAM: 3 OR MORE VIEW(S) XRAY OF THE FOOT 06/10/2024 02:11:00 PM COMPARISON: None available. CLINICAL HISTORY: Swelling Swelling Swelling Swelling Swelling FINDINGS: BONES AND JOINTS: No acute fracture. No malalignment. Small inferior calcaneal spur. Mild hallux valgus and osteoarthritis of the first metatarsophalangeal joint. SOFT TISSUES: Diffuse soft tissue swelling throughout the forefoot. IMPRESSION: 1. Diffuse soft tissue swelling throughout the forefoot. Electronically signed by: Norleen Boxer MD 06/10/2024 03:12 PM EST RP Workstation: [id number]     Procedures   Medications Ordered in the ED  cefTRIAXone  (ROCEPHIN ) 2 g in sodium chloride  0.9 % 100 mL IVPB (has no administration in time range)  methylPREDNISolone  sodium succinate (SOLU-MEDROL ) 125 mg/2 mL injection 125 mg (125 mg Intravenous Given 06/10/24 1415)  oxyCODONE -acetaminophen  (PERCOCET/ROXICET) 5-325 MG per tablet 1 tablet (1 tablet Oral Given 06/10/24 1414)                                    Medical Decision Making 69 year old female past medical history of diabetes, hypertension, and hyperlipidemia presenting to the emergency department today with right lower extremity redness.  This could  certainly be secondary to gout but given her history could be secondary to cellulitis which would be worsening with the doxycycline .  I will obtain basic labs here as well as an x-ray to evaluate for obvious findings of osteomyelitis.  I will give the patient Solu-Medrol  here as well as Percocet for pain and see if this helps.    The patient's labs here are reassuring.  She does have a mild leukocytosis which I suspect is from the prednisone .  Her x-ray does not show any findings consistent with osteomyelitis.  She did have significant improvement with the steroids here.  We discussed admission for IV antibiotics versus going home with return precautions since she is having significant improvement here.  After discussing risks and benefits she is ultimately discharged through shared decision making.  Will add Keflex  and continue the doxycycline  for better antibiotic coverage for cellulitis.  She is discharged and is encouraged to return to the ER for worsening symptoms.  Amount and/or Complexity of Data Reviewed Labs: ordered. Radiology: ordered.  Risk Prescription drug management.        Final diagnoses:  Right foot pain    ED Discharge Orders          Ordered    predniSONE  (STERAPRED UNI-PAK 21 TAB) 10 MG (21) TBPK tablet  Daily        06/10/24 1529    cephALEXin  (KEFLEX ) 500 MG capsule  4 times daily        06/10/24 1529    oxyCODONE -acetaminophen  (PERCOCET/ROXICET) 5-325 MG tablet  Every 6 hours PRN        06/10/24 1529               Ula Prentice SAUNDERS, MD 06/10/24 1531  "

## 2024-06-10 NOTE — ED Triage Notes (Signed)
 Pt reports R foot pain x2 days along with swelling. Pt denies any injury or trauma. Pt seen at [facility] for same yesterday and started abx.

## 2024-06-10 NOTE — Discharge Instructions (Signed)
 I still think that it is very likely that the pain in your foot is from gout.  Please start the steroids back and take until you run out.  Please continue the doxycycline  that you have been taking.  Starting tomorrow please take the Keflex  as well.  This will cover for any skin infection.  Please go to [facility] or [facility] if you have worsening symptoms at which point we will likely need to admit you.

## 2024-06-11 ENCOUNTER — Ambulatory Visit: Admitting: Internal Medicine

## 2024-06-15 LAB — CULTURE, BLOOD (ROUTINE X 2)
Culture: NO GROWTH
Special Requests: ADEQUATE

## 2024-06-26 ENCOUNTER — Other Ambulatory Visit (HOSPITAL_COMMUNITY): Payer: Self-pay

## 2024-06-27 ENCOUNTER — Telehealth: Payer: Self-pay | Admitting: General Practice

## 2024-06-27 NOTE — Telephone Encounter (Signed)
 Pt c/o medication issue:  1. Name of Medication: apixaban  (ELIQUIS ) 5 MG TABS tablet   2. How are you currently taking this medication (dosage and times per day)? As written   3. Are you having a reaction (difficulty breathing--STAT)? no  4. What is your medication issue?    Pt is requesting cheaper option for this medication. Please advise.

## 2024-06-28 ENCOUNTER — Other Ambulatory Visit: Payer: Self-pay | Admitting: Physician Assistant

## 2024-06-28 ENCOUNTER — Other Ambulatory Visit (HOSPITAL_COMMUNITY): Payer: Self-pay

## 2024-06-28 MED ORDER — DABIGATRAN ETEXILATE MESYLATE 150 MG PO CAPS: 150.0000 mg | ORAL_CAPSULE | Freq: Two times a day (BID) | ORAL | 3 refills | Status: AC

## 2024-06-28 NOTE — Telephone Encounter (Signed)
 Pt states she is ok with the switch. Does have questions about how she pays for the medication through them. Advised I will ask pharmacy for advisement and will speak with Tessa to get the medication ordered.

## 2024-06-28 NOTE — Progress Notes (Signed)
" °  Cardiology Note   I have not seen the patient in a few months.  Chart reviewed.  She can no longer afford her Eliquis  therefore, spoke to pharmacy who advised that Pradaxa  would be a cheaper option for the patient.  She is high risk of embolism with a CHA2DS2-VASc score of 5.  History of paroxysmal atrial fibrillation.  She overall has a low risk of bleeding with her age being less than 39, no previous documented GI bleeds.  Recent GFR 49.  Will confirm dosage of 150 mg twice a day versus 75 mg twice a day due to borderline creatinine clearance.  Orren LOISE Fabry, PA-C  "

## 2024-06-28 NOTE — Progress Notes (Signed)
 Pradaxa  sent in to her pharmacy on file. She will need to start this medication in place of her next dose of Eliquis .   Orren LOISE Fabry, PA-C

## 2024-07-09 ENCOUNTER — Ambulatory Visit: Admitting: General Practice
